# Patient Record
Sex: Female | Born: 1945 | Race: White | Hispanic: No | Marital: Married | Smoking: Never smoker
Health system: Southern US, Community
[De-identification: ages and names within clinical notes are randomized; demographics above are authoritative.]

## PROBLEM LIST (undated history)

## (undated) DIAGNOSIS — I509 Heart failure, unspecified: Secondary | ICD-10-CM

## (undated) DIAGNOSIS — J449 Chronic obstructive pulmonary disease, unspecified: Secondary | ICD-10-CM

## (undated) DIAGNOSIS — G8929 Other chronic pain: Secondary | ICD-10-CM

## (undated) DIAGNOSIS — Z9289 Personal history of other medical treatment: Secondary | ICD-10-CM

## (undated) DIAGNOSIS — I341 Nonrheumatic mitral (valve) prolapse: Secondary | ICD-10-CM

## (undated) DIAGNOSIS — J42 Unspecified chronic bronchitis: Secondary | ICD-10-CM

## (undated) DIAGNOSIS — F419 Anxiety disorder, unspecified: Secondary | ICD-10-CM

## (undated) DIAGNOSIS — I2510 Atherosclerotic heart disease of native coronary artery without angina pectoris: Secondary | ICD-10-CM

## (undated) DIAGNOSIS — M545 Low back pain, unspecified: Secondary | ICD-10-CM

## (undated) DIAGNOSIS — K219 Gastro-esophageal reflux disease without esophagitis: Secondary | ICD-10-CM

## (undated) DIAGNOSIS — J302 Other seasonal allergic rhinitis: Secondary | ICD-10-CM

## (undated) DIAGNOSIS — I10 Essential (primary) hypertension: Secondary | ICD-10-CM

## (undated) DIAGNOSIS — J45909 Unspecified asthma, uncomplicated: Secondary | ICD-10-CM

## (undated) DIAGNOSIS — R079 Chest pain, unspecified: Secondary | ICD-10-CM

## (undated) DIAGNOSIS — D649 Anemia, unspecified: Secondary | ICD-10-CM

## (undated) DIAGNOSIS — I214 Non-ST elevation (NSTEMI) myocardial infarction: Secondary | ICD-10-CM

## (undated) DIAGNOSIS — M1990 Unspecified osteoarthritis, unspecified site: Secondary | ICD-10-CM

## (undated) DIAGNOSIS — R600 Localized edema: Secondary | ICD-10-CM

## (undated) DIAGNOSIS — I209 Angina pectoris, unspecified: Secondary | ICD-10-CM

## (undated) DIAGNOSIS — M3500 Sicca syndrome, unspecified: Secondary | ICD-10-CM

## (undated) DIAGNOSIS — J189 Pneumonia, unspecified organism: Secondary | ICD-10-CM

## (undated) DIAGNOSIS — E7800 Pure hypercholesterolemia, unspecified: Secondary | ICD-10-CM

## (undated) DIAGNOSIS — R112 Nausea with vomiting, unspecified: Secondary | ICD-10-CM

## (undated) DIAGNOSIS — Z9889 Other specified postprocedural states: Secondary | ICD-10-CM

## (undated) DIAGNOSIS — R062 Wheezing: Secondary | ICD-10-CM

## (undated) HISTORY — PX: COLONOSCOPY W/ BIOPSIES AND POLYPECTOMY: SHX1376

## (undated) HISTORY — PX: ESOPHAGOGASTRODUODENOSCOPY: SHX1529

## (undated) HISTORY — DX: Unspecified osteoarthritis, unspecified site: M19.90

## (undated) HISTORY — PX: JOINT REPLACEMENT: SHX530

## (undated) HISTORY — PX: LAPAROSCOPIC SALPINGOOPHERECTOMY: SUR795

## (undated) HISTORY — PX: ABDOMINAL HYSTERECTOMY: SHX81

---

## 1998-08-06 ENCOUNTER — Emergency Department (HOSPITAL_COMMUNITY): Admission: EM | Admit: 1998-08-06 | Discharge: 1998-08-06 | Payer: Self-pay | Admitting: Emergency Medicine

## 1999-11-01 ENCOUNTER — Other Ambulatory Visit: Admission: RE | Admit: 1999-11-01 | Discharge: 1999-11-01 | Payer: Self-pay | Admitting: Obstetrics & Gynecology

## 2000-01-27 ENCOUNTER — Other Ambulatory Visit: Admission: RE | Admit: 2000-01-27 | Discharge: 2000-01-27 | Payer: Self-pay | Admitting: Obstetrics & Gynecology

## 2000-10-17 ENCOUNTER — Other Ambulatory Visit: Admission: RE | Admit: 2000-10-17 | Discharge: 2000-10-17 | Payer: Self-pay | Admitting: Obstetrics & Gynecology

## 2001-03-26 ENCOUNTER — Emergency Department (HOSPITAL_COMMUNITY): Admission: EM | Admit: 2001-03-26 | Discharge: 2001-03-26 | Payer: Self-pay | Admitting: Internal Medicine

## 2001-09-30 ENCOUNTER — Emergency Department (HOSPITAL_COMMUNITY): Admission: EM | Admit: 2001-09-30 | Discharge: 2001-09-30 | Payer: Self-pay | Admitting: Emergency Medicine

## 2001-11-08 ENCOUNTER — Other Ambulatory Visit: Admission: RE | Admit: 2001-11-08 | Discharge: 2001-11-08 | Payer: Self-pay | Admitting: Obstetrics & Gynecology

## 2002-02-26 ENCOUNTER — Encounter: Payer: Self-pay | Admitting: Orthopedic Surgery

## 2002-02-26 ENCOUNTER — Encounter: Admission: RE | Admit: 2002-02-26 | Discharge: 2002-02-26 | Payer: Self-pay | Admitting: Orthopedic Surgery

## 2002-04-09 ENCOUNTER — Other Ambulatory Visit: Admission: RE | Admit: 2002-04-09 | Discharge: 2002-04-09 | Payer: Self-pay | Admitting: Obstetrics & Gynecology

## 2002-11-29 ENCOUNTER — Other Ambulatory Visit: Admission: RE | Admit: 2002-11-29 | Discharge: 2002-11-29 | Payer: Self-pay | Admitting: Obstetrics & Gynecology

## 2004-01-13 ENCOUNTER — Other Ambulatory Visit: Admission: RE | Admit: 2004-01-13 | Discharge: 2004-01-13 | Payer: Self-pay | Admitting: Obstetrics & Gynecology

## 2004-04-27 ENCOUNTER — Encounter: Admission: RE | Admit: 2004-04-27 | Discharge: 2004-04-27 | Payer: Self-pay | Admitting: Family Medicine

## 2004-07-28 ENCOUNTER — Encounter: Admission: RE | Admit: 2004-07-28 | Discharge: 2004-07-28 | Payer: Self-pay | Admitting: Family Medicine

## 2005-01-14 ENCOUNTER — Other Ambulatory Visit: Admission: RE | Admit: 2005-01-14 | Discharge: 2005-01-14 | Payer: Self-pay | Admitting: Obstetrics & Gynecology

## 2005-08-24 ENCOUNTER — Encounter: Admission: RE | Admit: 2005-08-24 | Discharge: 2005-08-24 | Payer: Self-pay | Admitting: Family Medicine

## 2005-09-14 ENCOUNTER — Encounter: Admission: RE | Admit: 2005-09-14 | Discharge: 2005-09-14 | Payer: Self-pay | Admitting: Family Medicine

## 2005-10-12 ENCOUNTER — Encounter: Admission: RE | Admit: 2005-10-12 | Discharge: 2005-10-12 | Payer: Self-pay | Admitting: Orthopedic Surgery

## 2005-10-31 HISTORY — PX: TOTAL HIP ARTHROPLASTY: SHX124

## 2005-10-31 HISTORY — PX: JOINT REPLACEMENT: SHX530

## 2005-12-19 ENCOUNTER — Inpatient Hospital Stay (HOSPITAL_COMMUNITY): Admission: RE | Admit: 2005-12-19 | Discharge: 2005-12-21 | Payer: Self-pay | Admitting: Orthopedic Surgery

## 2009-11-02 ENCOUNTER — Encounter: Admission: RE | Admit: 2009-11-02 | Discharge: 2009-11-02 | Payer: Self-pay | Admitting: Allergy

## 2011-03-18 NOTE — H&P (Signed)
NAMEKHANIYA, TENAGLIA             ACCOUNT NO.:  [id number]   MEDICAL RECORD NO.:  [id number]          PATIENT TYPE:  INP   LOCATION:  NA                           FACILITY:  [facility]   PHYSICIAN:  Mila Homer. Sherlean Foot, M.D. DATE OF BIRTH:  1946-02-28   DATE OF ADMISSION:  12/19/2005  DATE OF DISCHARGE:                                HISTORY & PHYSICAL   CHIEF COMPLAINT:  Painful left hip.   HISTORY OF PRESENT ILLNESS:  A 65 year old white female with significant  left hip pain which has worsened.  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  She has failed with conservative  treatment including injections.  Her symptoms have worsened to the point  where she has pain with activities of daily living and with every step.  Therefore, she is now indicated for a total hip replacement.   PAST MEDICAL HISTORY:  In general her health is good.   SURGERIES:  1.  Partial hysterectomy in 1964.  2.  Oophorectomy in 1995.   MEDICATIONS:  1.  Premarin 1.25 mg daily.  2.  Prozac 10 mg daily.  3.  Diltiazem 240 mg daily.  4.  Fluticasone 50 mcg two squirts nasally daily.  5.  Nexium 40 mg q.12h.  6.  Vicodin ES 7.5/325 q.6h. p.r.n. pain.  7.  Flexeril 10 mg t.i.d. p.r.n.  8.  Geritol Complete one daily.  9.  Maxair one puff q.4-6h. p.r.n.  10. Advair 100/50, one puff b.i.d. p.r.n. q.12h.  11. Clindamycin 150 mg four tabs one hour prior to dental work.   ALLERGIES:  1.  PENICILLIN which developed a rash.  2.  CODEINE which caused her nausea.  3.  SULFA.   REVIEW OF SYSTEMS:  Positive for hypertension, asthma, and mitral valve  prolapse.   FAMILY HISTORY:  Positive for father with cardiomegaly.   SOCIAL HISTORY:  She is a very pleasant 65 year old white, married female,  [employer] operational officer.  She denies the use of tobacco or alcohol.   PHYSICAL EXAMINATION:  GENERAL:  A 65 year old white female well-developed,  well-nourished, mildly obese, alert, pleasant, cooperative,  moderate  distress secondary to left hip pain and groin pain.  VITAL SIGNS:  Reveal a temperature of 98.2, pulse 80, respirations 18, blood  pressure 150/82.  HEENT:  Head is normocephalic.  Eyes:  Pupils are equal, round, and reactive  to light and accommodation.  Extraocular movements intact.  Ear, nose, and  throat were benign.  NECK:  Supple.  No thyromegaly.  CHEST:  Had good expansion.  LUNGS:  Were essentially clear.  CARDIAC:  Regular rhythm and rate.  Normal S1 S2.  No discrete murmurs,  rubs, or gallops appreciated.  Pulses 2+ bilateral and symmetric.  No bruits  are noted.  ABDOMEN:  Obese, soft, nontender.  No masses palpable.  Normal bowel sounds  present.  GENITAL/RECTAL/BREASTS:  Not performed and not indicated for the procedure.  CNS:  Oriented x3.  Cranial nerves II-XII grossly intact.  MUSCULOSKELETAL:  She has minimal motion of the left hip with internal and  external rotation.  She can go  from 0 degrees to 100 degrees of flexion.  Positive log roll.  Sensation is intact distally.  Good capillary refill and  pulses are noted.   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   CLINICAL IMPRESSION:  Avascular necrosis, left hip with collapse.   RECOMMENDATIONS:  At this time, we feel she is a candidate for a left total  hip arthroplasty.  Procedure risks and benefits have been fully explained.  She is understanding.  We will proceed with this on December 19, 2005.      Oris Drone Petrarca, P.A.-C.    ______________________________  Mila Homer. Sherlean Foot, M.D.    BDP/MEDQ  D:  12/13/2005  T:  12/13/2005  Job:  [id number]

## 2011-03-18 NOTE — Discharge Summary (Signed)
NAMEMIRELY, PANGLE             ACCOUNT NO.:  [id number]   MEDICAL RECORD NO.:  [id number]          PATIENT TYPE:  INP   LOCATION:  5007                         FACILITY:  [facility]   PHYSICIAN:  Legrand Pitts. Duffy, P.A.   DATE OF BIRTH:  Nov 28, 1945   DATE OF ADMISSION:  12/19/2005  DATE OF DISCHARGE:  12/21/2005                                 DISCHARGE SUMMARY   ADMISSION DIAGNOSES:  1.  Avascular necrosis left hip.  2.  Gastroesophageal reflux disease.  3.  Asthma.  4.  Depression.  5.  Hypertension.  6.  Mitral bowel prolapse.   DISCHARGE DIAGNOSES:  1.  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  2.  Acute blood loss anemia secondary to surgery.  3  Hypertension.  4  Asthma.  5  Mitral valve prolapse.  6  Gastroesophageal reflux disease.   SURGICAL PROCEDURE:  On December 19, 2005 Ms. Katich underwent a left total  hip arthroplasty by Dr. Mila Homer. Lucey assisted by Richardean Canal, P.A-C.  She had a trilogy acetabular system shell without holes, 52-mm outer  diameter placed, with a Longevity cross length poly liner, a 32-mm inner  diameter, 52-mm outer diameter, a femoral head, 12-14 taper, non__________  32-mm diameter, plus 7-mm neck lengths with a femoral stem beaded, full coat  collared, standard neck, offset size 13.   COMPLICATIONS:  None.   CONSULTS:  1.  Physical therapy and case management consult December 20, 2005.  2.  Occupational therapy consult December 21, 2005.   HISTORY OF PRESENT ILLNESS:  A 65 year old white female patient presented to  Dr. Sherlean Foot with a significant history of left hip pain which she gotten  progressively worse. She now has pain with activities of daily living and  with every step. X-rays show __________ with femoral head collapse. Because  of that, she is presenting for a left hip replacement.   HOSPITAL COURSE:  Ms. Hafen, tolerated her surgical procedure well without  immediate postoperative complications.  She was transferred to 5000. Postop  day #1 pain was controlled with meds. T-max 100.6, vitals were stable. Left  leg was neurovascularly intact. Hemoglobin was 9.5, hematocrit 27.7. The  hemoglobin and hematocrit were monitored; and she was started on therapy per  protocol.   On postop day #2 she was afebrile, vitals stable. Pain was controlled with  meds. Hemoglobin had dropped to 8.7 with hematocrit of 25.5. She did well  with therapy; and was ready for discharge home and was discharged home later  that day.   DISCHARGE INSTRUCTIONS:  Diet:  She can resume her regular  prehospitalization diet.   MEDICATIONS:  She is to resume her preoperative meds. Those included:  1.  Multivitamin 1 tablet p.o. q.a.m..  2.  Advair discus 100/50 one puff inhaled p.r.n..  3.  Maxair 2 puffs inhaled p.r.n. q.4 h.  4.  Nexium 40 mg p.o. q.a.m.  5.  Premarin 1.25 mg p.o. q.a.m.  6.  Fluticasone 2 sprays inhaled q.a.m.  7.  Clindamycin 150 mg p.o. 4 tabs prior to dental procedures.  8.  Diltiazem ER 240 mg p.o. q.a.m.  9.  Fluoxetine 10 mg p.o. q.a.m.  10. Extra strength Vicodin 7.5/325 mg 1-2 p.o. q.6 h. p.r.n. for pain, not      to take while she is on other pain meds.  Additional meds at this time included:  1.  Lovenox 40 mg subcu q.a.m. last dose January 02, 2006.  2.  Norco 5-325 mg 1-2 tablets p.o. q.4 h. p.r.n. for pain 50 with no      refill.  3.  Robaxin 500 mg 1-2 tablets p.o. q.6 h. p.r.n. for spasms 40 with no      refill.  4.  She is encouraged to take iron supplement 2-3 times a day for the next 1      to 1-1/2 months.   ACTIVITY:  She can be out of bed, weightbearing as tolerated on the left leg  with the use of walker. She is to have home health PT per [facility]. Please see the blue total hip discharge sheet for further with  activity instructions.   WOUND CARE:  Please keep the left hip incision clean and dry. May shower  after no drainage from the wound for 2  days. Please see the blue total hip  discharge sheet for further wound care instructions.   FOLLOWUP:  She is to follow up with Dr. Sherlean Foot, in our office, on Tuesday  January 03, 2006; needs to call [phone] for that appointment.   LABORATORY DATA:  X-ray taken of the left hip after surgery on February 19  showed the left hip replacement in good position without complicating  features.  Hemoglobin and hematocrit ranged from 11.9 and 35.1 on February  13 with a platelet count of 420; to a low of 8.7 and 25.5 with platelet  count of 280 on February 21. White count at that time was 14.4.  On February  20 sodium was 132, glucose 119, BUN 5, creatinine 0.18, and calcium 8.1. On  February 21 sodium and potassium within normal limits; glucose was 141 and  calcium 8.1. All other laboratory studies were within normal limits.      Legrand Pitts Duffy, P.A.     KED/MEDQ  D:  02/17/2006  T:  02/18/2006  Job:  [id number]

## 2011-03-18 NOTE — Op Note (Signed)
NAMECHAYLEE, Donna Williamson             ACCOUNT NO.:  [id number]   MEDICAL RECORD NO.:  [id number]          PATIENT TYPE:  INP   LOCATION:  2550                         FACILITY:  [facility]   PHYSICIAN:  Mila Homer. Sherlean Foot, M.D. DATE OF BIRTH:  07/18/1946   DATE OF PROCEDURE:  12/19/2005  DATE OF DISCHARGE:                                 OPERATIVE REPORT   SURGEON:  Mila Homer. Sherlean Foot, M.D.   ASSISTANT:  Richardean Canal, P.A.   ANESTHESIA:  General.   PREOPERATIVE DIAGNOSIS:  Left hip avascular necrosis.   POSTOPERATIVE DIAGNOSIS:  Left hip avascular necrosis.   PROCEDURE:  Left total hip arthroplasty.   INDICATION FOR PROCEDURE:  The patient is a 65 year old with failure of  conservative measures and radiographic evidence of AVN of the femoral head.  Informed consent was obtained.   DESCRIPTION OF PROCEDURE:  The patient was laid supine and administered  general anesthesia.  A Foley catheter was placed.  She was then placed in  the right lateral decubitus position.  The left hip was then prepped and  draped in the usual sterile fashion.  I then used a 10 blade to make a  curvilinear incision centered over the trochanter.  I then went down to and  through the fascia lata and obtained hemostasis.  I placed a Charnley  retract in place.  I then performed an anterolateral approach to the hip,  taking up the anterior one-half of the vastus lateralis, one-third of the  gluteus medius and the gluteus minimus with that.  I tagged it with 3 stay  sutures.  I then performed an anterior hip capsulectomy.  I then used a  cutting guide to make out our neck cut and made the cut and removed the neck  and head segment easily.  I then placed a Homan retractor anteriorly and  posteriorly, removed the labrum and scar tissues circumferentially around  the hip.  I then switched sides with the table and I then reamed  sequentially up to 50 mm and placed a 52-mm needle-nosed spike and native  acetabular  alignment.  I then tamped in the 7-mm offset polyethylene and  switched back to the backside of the patient.  I then externally rotated the  leg into a sterile pouch off the anterior side of the bed.  I gained access  with the femoral canal finder.  I then reamed laterally in the trochanter  and then sequentially reamed up to 13 mm.  I then broached to 13 and used  the calcar planer.  I then trialed various head sizes and a +7 mm gives the  best stability, offset and leg length.  I then removed the trial components  and tamped in a fully porous-coated size 13 stem, placed a 7-mm ball onto a  clean Morse taper and located the hip.  I then irrigated and then closed the  medius, vastus lateralis and minimus sleeve through 3 drills holes in the  trochanter.  I then closed the fascia lata with running #1 Vicryl sutures,  deep soft tissues with interrupted 0 Vicryl sutures, subcuticular with 2-0  Vicryl sutures and skin staples.   COMPLICATIONS:  None.   DRAINS:  None.   ESTIMATED BLOOD LOSS:  300 mL.   DRESSINGS:  Adaptic, 4 x 4, ABDs and an Ioban drape.           ______________________________  Mila Homer. Sherlean Foot, M.D.     SDL/MEDQ  D:  12/19/2005  T:  12/20/2005  Job:  [id number]

## 2011-03-21 ENCOUNTER — Emergency Department (HOSPITAL_COMMUNITY)
Admission: EM | Admit: 2011-03-21 | Discharge: 2011-03-21 | Disposition: A | Discharge status: Home / Self Care | Payer: Medicare Other | Attending: Emergency Medicine | Admitting: Emergency Medicine

## 2011-03-21 DIAGNOSIS — R21 Rash and other nonspecific skin eruption: Secondary | ICD-10-CM | POA: Insufficient documentation

## 2011-03-21 DIAGNOSIS — J40 Bronchitis, not specified as acute or chronic: Secondary | ICD-10-CM | POA: Insufficient documentation

## 2011-03-21 DIAGNOSIS — I10 Essential (primary) hypertension: Secondary | ICD-10-CM | POA: Insufficient documentation

## 2011-09-30 ENCOUNTER — Other Ambulatory Visit: Payer: Self-pay | Admitting: Allergy

## 2011-09-30 ENCOUNTER — Ambulatory Visit
Admission: RE | Admit: 2011-09-30 | Discharge: 2011-09-30 | Disposition: A | Discharge status: Home / Self Care | Payer: Medicare Other | Source: Ambulatory Visit | Attending: Allergy | Admitting: Allergy

## 2012-03-03 ENCOUNTER — Ambulatory Visit (INDEPENDENT_AMBULATORY_CARE_PROVIDER_SITE_OTHER): Payer: BC Managed Care – PPO | Admitting: Emergency Medicine

## 2012-03-03 ENCOUNTER — Ambulatory Visit: Payer: BC Managed Care – PPO

## 2012-03-03 VITALS — BP 160/77 | HR 83 | Temp 97.6°F | Resp 16 | Ht 65.0 in | Wt 219.4 lb

## 2012-03-03 DIAGNOSIS — R05 Cough: Secondary | ICD-10-CM

## 2012-03-03 DIAGNOSIS — J45909 Unspecified asthma, uncomplicated: Secondary | ICD-10-CM

## 2012-03-03 DIAGNOSIS — Z9109 Other allergy status, other than to drugs and biological substances: Secondary | ICD-10-CM

## 2012-03-03 LAB — POCT CBC
Granulocyte percent: 75.8 %G (ref 37–80)
Lymph, poc: 2.3 (ref 0.6–3.4)
MCH, POC: 28.9 pg (ref 27–31.2)
MCV: 89.8 fL (ref 80–97)
MID (cbc): 0.4 (ref 0–0.9)
MPV: 7.4 fL (ref 0–99.8)
POC LYMPH PERCENT: 20.5 %L (ref 10–50)
POC MID %: 3.7 %M (ref 0–12)

## 2012-03-03 MED ORDER — ALBUTEROL SULFATE (2.5 MG/3ML) 0.083% IN NEBU
2.5000 mg | INHALATION_SOLUTION | Freq: Once | RESPIRATORY_TRACT | Status: AC
  Administered 2012-03-03: 2.5 mg via RESPIRATORY_TRACT

## 2012-03-03 MED ORDER — BENZONATATE 200 MG PO CAPS
200.0000 mg | ORAL_CAPSULE | Freq: Three times a day (TID) | ORAL | Status: AC | PRN
Start: 2012-03-03 — End: 2012-03-10

## 2012-03-03 MED ORDER — PREDNISONE 20 MG PO TABS: ORAL_TABLET | ORAL | Status: DC

## 2012-03-03 NOTE — Progress Notes (Signed)
  Subjective:    Patient ID: Donna Williamson, female    DOB: [birth year], 66 y.o.   MRN: [id number]  HPI patient has been sick since April 18. She was initially seen by Dr. [place] and treated for sinusitis bronchitis. She was initially treated with a cortisone shot and Z-Pak. Biaxin. She continues to have a bad cough. She has been unable to get any sleep because of the cough patient is in good health except for seasonal allergies and severe reactive airways disease. She is on chronic Advair use and has a rescue inhaler.    Review of Systems she denies chest pain shortness of breath difficulty breathing or GI symptoms     Objective:   Physical Exam  Constitutional: She appears well-developed and well-nourished.  HENT:  Right Ear: External ear normal.  Left Ear: External ear normal.  Eyes: Pupils are equal, round, and reactive to light.  Neck: No tracheal deviation present.  Cardiovascular: Normal rate, regular rhythm and normal heart sounds.  Exam reveals no friction rub.   No murmur heard. Pulmonary/Chest: Effort normal. She has wheezes.       She has a prolonged inspiratory and expiratory phase. There were no areas of dullness. No true rales heard.    Results for orders placed in visit on 03/03/12  POCT CBC      Component Value Range   WBC 11.4 (*) 4.6 - 10.2 (K/uL)   Lymph, poc 2.3  0.6 - 3.4    POC LYMPH PERCENT 20.5  10 - 50 (%L)   MID (cbc) 0.4  0 - 0.9    POC MID % 3.7  0 - 12 (%M)   POC Granulocyte 8.6 (*) 2 - 6.9    Granulocyte percent 75.8  37 - 80 (%G)   RBC 3.70 (*) 4.04 - 5.48 (M/uL)   Hemoglobin 10.7 (*) 12.2 - 16.2 (g/dL)   HCT, POC 40.9 (*) 81.1 - 47.9 (%)   MCV 89.8  80 - 97 (fL)   MCH, POC 28.9  27 - 31.2 (pg)   MCHC 32.2  31.8 - 35.4 (g/dL)   RDW, POC 91.4     Platelet Count, POC 433 (*) 142 - 424 (K/uL)   MPV 7.4  0 - 99.8 (fL)   [facility] reading (PRIMARY) by  Dr.Kinsey Karch no acute disease .      Assessment & Plan:    I suspect most of this is allergy  related. She has a history of reactive airways disease and I feel she just has not improved with her current medicines. We'll treat with a prednisone Tessalon Perles and continue delsym.

## 2012-03-09 ENCOUNTER — Ambulatory Visit
Admission: RE | Admit: 2012-03-09 | Discharge: 2012-03-09 | Disposition: A | Discharge status: Home / Self Care | Payer: Medicare Other | Source: Ambulatory Visit | Attending: Allergy | Admitting: Allergy

## 2012-03-09 ENCOUNTER — Other Ambulatory Visit: Payer: Self-pay | Admitting: Allergy

## 2012-03-09 DIAGNOSIS — J329 Chronic sinusitis, unspecified: Secondary | ICD-10-CM

## 2012-03-27 ENCOUNTER — Other Ambulatory Visit (HOSPITAL_COMMUNITY)
Admission: RE | Admit: 2012-03-27 | Discharge: 2012-03-27 | Disposition: A | Discharge status: Home / Self Care | Payer: Medicare Other | Source: Ambulatory Visit | Attending: Gastroenterology | Admitting: Gastroenterology

## 2012-03-27 ENCOUNTER — Other Ambulatory Visit: Payer: Self-pay | Admitting: Gastroenterology

## 2012-03-27 DIAGNOSIS — B379 Candidiasis, unspecified: Secondary | ICD-10-CM | POA: Insufficient documentation

## 2012-03-27 DIAGNOSIS — K2289 Other specified disease of esophagus: Secondary | ICD-10-CM | POA: Insufficient documentation

## 2012-03-27 DIAGNOSIS — K228 Other specified diseases of esophagus: Secondary | ICD-10-CM | POA: Insufficient documentation

## 2012-09-20 ENCOUNTER — Other Ambulatory Visit: Payer: Self-pay | Admitting: Podiatry

## 2012-09-20 DIAGNOSIS — R52 Pain, unspecified: Secondary | ICD-10-CM

## 2012-09-29 ENCOUNTER — Encounter (HOSPITAL_COMMUNITY): Payer: Self-pay | Admitting: Emergency Medicine

## 2012-09-29 ENCOUNTER — Emergency Department (HOSPITAL_COMMUNITY)
Admission: EM | Admit: 2012-09-29 | Discharge: 2012-09-30 | Disposition: A | Discharge status: Home / Self Care | Payer: Medicare Other | Attending: Emergency Medicine | Admitting: Emergency Medicine

## 2012-09-29 ENCOUNTER — Emergency Department (HOSPITAL_COMMUNITY): Payer: Medicare Other

## 2012-09-29 DIAGNOSIS — Z79899 Other long term (current) drug therapy: Secondary | ICD-10-CM | POA: Insufficient documentation

## 2012-09-29 DIAGNOSIS — I10 Essential (primary) hypertension: Secondary | ICD-10-CM | POA: Insufficient documentation

## 2012-09-29 DIAGNOSIS — F411 Generalized anxiety disorder: Secondary | ICD-10-CM | POA: Insufficient documentation

## 2012-09-29 DIAGNOSIS — R071 Chest pain on breathing: Secondary | ICD-10-CM | POA: Insufficient documentation

## 2012-09-29 DIAGNOSIS — R0789 Other chest pain: Secondary | ICD-10-CM

## 2012-09-29 DIAGNOSIS — J45909 Unspecified asthma, uncomplicated: Secondary | ICD-10-CM | POA: Insufficient documentation

## 2012-09-29 DIAGNOSIS — K219 Gastro-esophageal reflux disease without esophagitis: Secondary | ICD-10-CM | POA: Insufficient documentation

## 2012-09-29 HISTORY — DX: Gastro-esophageal reflux disease without esophagitis: K21.9

## 2012-09-29 HISTORY — DX: Essential (primary) hypertension: I10

## 2012-09-29 HISTORY — DX: Anxiety disorder, unspecified: F41.9

## 2012-09-29 HISTORY — DX: Unspecified asthma, uncomplicated: J45.909

## 2012-09-29 LAB — CBC WITH DIFFERENTIAL/PLATELET
Lymphs Abs: 3 10*3/uL (ref 0.7–4.0)
MCH: 28.5 pg (ref 26.0–34.0)
MCHC: 32 g/dL (ref 30.0–36.0)
Monocytes Absolute: 0.7 10*3/uL (ref 0.1–1.0)
Monocytes Relative: 9 % (ref 3–12)
Neutro Abs: 4.7 10*3/uL (ref 1.7–7.7)
Neutrophils Relative %: 55 % (ref 43–77)
Platelets: 369 10*3/uL (ref 150–400)
RBC: 3.69 MIL/uL — ABNORMAL LOW (ref 3.87–5.11)
RDW: 12.8 % (ref 11.5–15.5)
WBC: 8.6 10*3/uL (ref 4.0–10.5)

## 2012-09-29 LAB — URINALYSIS, ROUTINE W REFLEX MICROSCOPIC
Glucose, UA: NEGATIVE mg/dL
Hgb urine dipstick: NEGATIVE
Nitrite: NEGATIVE
Protein, ur: NEGATIVE mg/dL
Specific Gravity, Urine: 1.013 (ref 1.005–1.030)
Urobilinogen, UA: 0.2 mg/dL (ref 0.0–1.0)
pH: 5.5 (ref 5.0–8.0)

## 2012-09-29 LAB — LIPASE, BLOOD: Lipase: 36 U/L (ref 11–59)

## 2012-09-29 LAB — COMPREHENSIVE METABOLIC PANEL
GFR calc Af Amer: 46 mL/min — ABNORMAL LOW (ref >90)
GFR calc non Af Amer: 40 mL/min — ABNORMAL LOW (ref >90)
Total Bilirubin: 0.1 mg/dL — ABNORMAL LOW (ref 0.3–1.2)
Total Protein: 7 g/dL (ref 6.0–8.3)

## 2012-09-29 MED ORDER — KETOROLAC TROMETHAMINE 30 MG/ML IJ SOLN
15.0000 mg | Freq: Once | INTRAMUSCULAR | Status: AC
  Administered 2012-09-29: 15 mg via INTRAVENOUS
  Filled 2012-09-29: qty 1

## 2012-09-29 MED ORDER — SODIUM CHLORIDE 0.9 % IV SOLN
INTRAVENOUS | Status: DC
  Administered 2012-09-29: 23:00:00 via INTRAVENOUS

## 2012-09-29 MED ORDER — LORAZEPAM 2 MG/ML IJ SOLN
0.5000 mg | Freq: Once | INTRAMUSCULAR | Status: AC
  Administered 2012-09-29: 0.5 mg via INTRAVENOUS
  Filled 2012-09-29: qty 1

## 2012-09-29 MED ORDER — MORPHINE SULFATE 4 MG/ML IJ SOLN
4.0000 mg | Freq: Once | INTRAMUSCULAR | Status: DC
  Filled 2012-09-29: qty 1

## 2012-09-29 MED ORDER — IOHEXOL 350 MG/ML SOLN
80.0000 mL | Freq: Once | INTRAVENOUS | Status: AC | PRN
  Administered 2012-09-29: 80 mL via INTRAVENOUS

## 2012-09-29 MED ORDER — SODIUM CHLORIDE 0.9 % IV BOLUS (SEPSIS)
1000.0000 mL | Freq: Once | INTRAVENOUS | Status: AC
  Administered 2012-09-29: 1000 mL via INTRAVENOUS

## 2012-09-29 NOTE — ED Notes (Signed)
Patient transported to X-ray 

## 2012-09-29 NOTE — ED Notes (Signed)
Patient transported to CT 

## 2012-09-29 NOTE — ED Notes (Signed)
Pt. States that Wed. She started having RUQ pain that goes around into her back. Denies N/V. States she felt she had a fever last night. Only hurts when she takes deep breath.

## 2012-09-29 NOTE — ED Provider Notes (Signed)
History     CSN: [id number]  Arrival date & time 09/29/12  2116   First MD Initiated Contact with Patient 09/29/12 2136      Chief Complaint  Patient presents with  . Abdominal Pain    (Consider location/radiation/quality/duration/timing/severity/associated sxs/prior treatment) Patient is a 65 y.o. female presenting with abdominal pain. The history is provided by the patient.  Abdominal Pain The primary symptoms of the illness include abdominal pain.   patient here with right-sided chest pain that has been for 3 days. Pain is worse with taking deep breath. States that she possibly had a fever last night. Apparently when she takes a deep breath and make certain movements and localized over her right costal margin. Denies any abdominal pain, vomiting, diarrhea. No association with food. Called her Dr. was told to come in for evaluation. No recent leg pain or swelling.  Past Medical History  Diagnosis Date  . Hypertension   . GERD (gastroesophageal reflux disease)   . Anxiety   . Asthma     Past Surgical History  Procedure Date  . Abdominal hysterectomy   . Hip orif w/ capsulotomy Left    No family history on file.  History  Substance Use Topics  . Smoking status: Never Smoker   . Smokeless tobacco: Not on file  . Alcohol Use: No    OB History    Grav Para Term Preterm Abortions TAB SAB Ect Mult Living                  Review of Systems  Gastrointestinal: Positive for abdominal pain.  All other systems reviewed and are negative.    Allergies  Levaquin; Codeine; Ciprofloxacin; Penicillins; and Sulfa antibiotics  Home Medications   Current Outpatient Rx  Name  Route  Sig  Dispense  Refill  . ALPRAZOLAM 0.5 MG PO TABS   Oral   Take 0.5 mg by mouth daily as needed. For anxiety         . ASPIRIN 81 MG PO CHEW   Oral   Chew 81 mg by mouth daily.         Marland Kitchen DILTIAZEM HCL ER 240 MG PO CP24   Oral   Take 240 mg by mouth daily.         Marland Kitchen ESTRADIOL 2  MG PO TABS   Oral   Take 2 mg by mouth daily.         Marland Kitchen FLUOXETINE HCL 10 MG PO TABS   Oral   Take 10 mg by mouth daily.         Marland Kitchen FLUTICASONE PROPIONATE 50 MCG/ACT NA SUSP   Nasal   Place 2 sprays into the nose daily.         Marland Kitchen FLUTICASONE-SALMETEROL 100-50 MCG/DOSE IN AEPB   Inhalation   Inhale 1 puff into the lungs every 12 (twelve) hours.         Marland Kitchen LEVALBUTEROL TARTRATE 45 MCG/ACT IN AERO   Inhalation   Inhale 1-2 puffs into the lungs every 4 (four) hours as needed.         Marland Kitchen MONTELUKAST SODIUM 10 MG PO TABS   Oral   Take 10 mg by mouth at bedtime.         Marland Kitchen OLMESARTAN MEDOXOMIL 40 MG PO TABS   Oral   Take 40 mg by mouth daily.         Marland Kitchen OMEPRAZOLE 40 MG PO CPDR   Oral   Take 40  mg by mouth daily.         Marland Kitchen VITAMIN B-12 1000 MCG PO TABS   Oral   Take 1,000 mcg by mouth daily.         Marland Kitchen VITAMIN D (ERGOCALCIFEROL) 50000 UNITS PO CAPS   Oral   Take 50,000 Units by mouth.           BP 168/65  Pulse 69  Temp 98 F (36.7 C) (Oral)  Resp 18  SpO2 97%  Physical Exam  Nursing note and vitals reviewed. Constitutional: She is oriented to person, place, and time. She appears well-developed and well-nourished.  Non-toxic appearance. No distress.  HENT:  Head: Normocephalic and atraumatic.  Eyes: Conjunctivae normal, EOM and lids are normal. Pupils are equal, round, and reactive to light.  Neck: Normal range of motion. Neck supple. No tracheal deviation present. No mass present.  Cardiovascular: Normal rate, regular rhythm and normal heart sounds.  Exam reveals no gallop.   No murmur heard. Pulmonary/Chest: Effort normal and breath sounds normal. No stridor. No respiratory distress. She has no decreased breath sounds. She has no wheezes. She has no rhonchi. She has no rales.    Abdominal: Soft. Normal appearance and bowel sounds are normal. She exhibits no distension. There is no tenderness. There is no rebound and no CVA tenderness.    Musculoskeletal: Normal range of motion. She exhibits no edema and no tenderness.  Neurological: She is alert and oriented to person, place, and time. She has normal strength. No cranial nerve deficit or sensory deficit. GCS eye subscore is 4. GCS verbal subscore is 5. GCS motor subscore is 6.  Skin: Skin is warm and dry. No abrasion and no rash noted.  Psychiatric: She has a normal mood and affect. Her speech is normal and behavior is normal.    ED Course  Procedures (including critical care time)  Labs Reviewed  CBC WITH DIFFERENTIAL - Abnormal; Notable for the following:    RBC 3.69 (*)     Hemoglobin 10.5 (*)     HCT 32.8 (*)     All other components within normal limits  D-DIMER, QUANTITATIVE - Abnormal; Notable for the following:    D-Dimer, Quant 0.56 (*)     All other components within normal limits  COMPREHENSIVE METABOLIC PANEL  LIPASE, BLOOD  URINALYSIS, ROUTINE W REFLEX MICROSCOPIC   Dg Chest 2 View  09/29/2012  *RADIOLOGY REPORT*  Clinical Data: Right-sided rib and chest pain for 4 days.  History of asthma.  CHEST - 2 VIEW  Comparison: Chest radiograph performed 03/03/2012  Findings: The lungs are well-aerated and clear.  There is no evidence of focal opacification, pleural effusion or pneumothorax. Minimal retrocardiac opacity is thought to reflect normal vasculature.  The heart is normal in size; the mediastinal contour is within normal limits.  No acute osseous abnormalities are seen.  IMPRESSION: No acute cardiopulmonary process seen.   Original Report Authenticated By: Tonia Ghent, M.D.      No diagnosis found.    MDM  No evidence of pe. Pt with likely chest wall --stable for discharge        Toy Baker, MD 09/30/12 0008

## 2012-09-30 MED ORDER — OXYCODONE-ACETAMINOPHEN 5-325 MG PO TABS: 2.0000 | ORAL_TABLET | ORAL | Status: DC | PRN

## 2012-09-30 MED ORDER — IBUPROFEN 600 MG PO TABS: 600.0000 mg | ORAL_TABLET | Freq: Four times a day (QID) | ORAL | Status: DC | PRN

## 2012-09-30 MED ORDER — METHOCARBAMOL 500 MG PO TABS: 500.0000 mg | ORAL_TABLET | Freq: Two times a day (BID) | ORAL | Status: DC

## 2012-10-02 ENCOUNTER — Ambulatory Visit
Admission: RE | Admit: 2012-10-02 | Discharge: 2012-10-02 | Disposition: A | Discharge status: Home / Self Care | Payer: Medicare Other | Source: Ambulatory Visit | Attending: Podiatry | Admitting: Podiatry

## 2012-10-02 DIAGNOSIS — R52 Pain, unspecified: Secondary | ICD-10-CM

## 2012-11-11 ENCOUNTER — Ambulatory Visit (INDEPENDENT_AMBULATORY_CARE_PROVIDER_SITE_OTHER): Payer: Medicare Other | Admitting: Family Medicine

## 2012-11-11 VITALS — BP 131/72 | HR 94 | Temp 98.4°F | Resp 16 | Ht 65.25 in | Wt 220.2 lb

## 2012-11-11 DIAGNOSIS — H60399 Other infective otitis externa, unspecified ear: Secondary | ICD-10-CM

## 2012-11-11 DIAGNOSIS — R42 Dizziness and giddiness: Secondary | ICD-10-CM

## 2012-11-11 DIAGNOSIS — H6090 Unspecified otitis externa, unspecified ear: Secondary | ICD-10-CM

## 2012-11-11 DIAGNOSIS — H6590 Unspecified nonsuppurative otitis media, unspecified ear: Secondary | ICD-10-CM

## 2012-11-11 DIAGNOSIS — H9209 Otalgia, unspecified ear: Secondary | ICD-10-CM

## 2012-11-11 MED ORDER — NEOMYCIN-POLYMYXIN-HC 3.5-10000-1 OT SOLN: 3.0000 [drp] | Freq: Four times a day (QID) | OTIC | Status: DC

## 2012-11-11 NOTE — Patient Instructions (Addendum)
Start cortisporin otic drops - this does have a sulfa type medicine in it, but less likely to cause a reaction than oral medicines.  If any new rash, or side effects stop this and return to clinic or emergency room. Recheck in next 3 days, sooner if worse. Saline nasal spray as needed, drink plenty of fluids. If any worsening of dizziness  - recheck.  Return to the clinic or go to the nearest emergency room if any of your symptoms worsen or new symptoms occur.

## 2012-11-11 NOTE — Progress Notes (Signed)
  Subjective:    Patient ID: Donna Williamson, female    DOB: August 11, 1946, 67 y.o.   MRN: [id number]  HPI Donna Williamson is a 67 y.o. female  Primary care Dr. Waynard Edwards. Last OV few weeks ago.   L ear soreness.  Noted about 10-11 days ago.  Notes some wetness on pillow in the am.  Tried peroxide wash last week,  feels more sore now. and balance off at times with pressure in ear. No fever - sweats in am.  No other cold symptoms.   Review of Systems  Constitutional: Negative for fever and chills.  Respiratory: Negative for cough, chest tightness and shortness of breath.   Cardiovascular: Negative for chest pain and palpitations.      Objective:   Physical Exam  Vitals reviewed. Constitutional: She is oriented to person, place, and time. She appears well-developed and well-nourished. No distress.  HENT:  Head: Normocephalic and atraumatic.  Right Ear: Hearing, tympanic membrane, external ear and ear canal normal.  Left Ear: There is drainage and swelling. A middle ear effusion is present.  Nose: Nose normal.  Mouth/Throat: Oropharynx is clear and moist. No oropharyngeal exudate.       Minimal swelling of canal, with yellow white adherent exudate.  Unable to visualize entire TM, but no apparent rupture. Clear fluid behind TM.   Eyes: Conjunctivae normal and EOM are normal. Pupils are equal, round, and reactive to light.  Cardiovascular: Normal rate, regular rhythm, normal heart sounds and intact distal pulses.   No murmur heard. Pulmonary/Chest: Effort normal and breath sounds normal. No respiratory distress. She has no wheezes. She has no rhonchi.  Neurological: She is alert and oriented to person, place, and time. She has normal strength. No sensory deficit. Coordination normal.       No nystagmus, no pronator drift. Nonfocal exam.  Skin: Skin is warm and dry. No rash noted. She is not diaphoretic.  Psychiatric: She has a normal mood and affect. Her behavior is normal.        Assessment & Plan:  [name] is a 67 y.o. female 1. Otitis externa  neomycin-polymyxin-hydrocortisone (CORTISPORIN) otic solution  2. Serous otitis media    3. Otalgia    4. Dizziness     Suspected otitis externa, but unlikely yet possible serous otitis with rupture.  Complicated by quinolone allergy.  Will treat with cortisporin otic susp - cautioned on sulfa component and to rtc if any new rash or side effects. Recheck in 3 days - may need po meds if not improving or sooner if worsening symptoms.   Patient Instructions  Start cortisporin otic drops - this does have a sulfa type medicine in it, but less likely to cause a reaction than oral medicines.  If any new rash, or side effects stop this and return to clinic or emergency room. Recheck in next 3 days, sooner if worse. Saline nasal spray as needed, drink plenty of fluids. If any worsening of dizziness  - recheck.  Return to the clinic or go to the nearest emergency room if any of your symptoms worsen or new symptoms occur.

## 2012-11-14 ENCOUNTER — Ambulatory Visit (INDEPENDENT_AMBULATORY_CARE_PROVIDER_SITE_OTHER): Payer: Medicare Other | Admitting: Family Medicine

## 2012-11-14 VITALS — BP 130/70 | HR 77 | Temp 98.1°F | Resp 16 | Ht 65.5 in | Wt 221.0 lb

## 2012-11-14 DIAGNOSIS — H6092 Unspecified otitis externa, left ear: Secondary | ICD-10-CM

## 2012-11-14 DIAGNOSIS — H60399 Other infective otitis externa, unspecified ear: Secondary | ICD-10-CM

## 2012-11-14 NOTE — Progress Notes (Signed)
  Subjective:    Patient ID: Donna Williamson, female    DOB: 1946/09/07, 67 y.o.   MRN: [id number]  HPI Donna Williamson is a 67 y.o. female Seen 3 days ago - treated with cortisporin otic for otitis externa.  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   Feeling a little better. Hearing ok, but still a little full.  No fever.  No recent discharge noted. Applying drops about 3 times per day.  No headache. Tolerating drops without difficulty.    Review of Systems  Constitutional: Negative for fever and chills.  HENT: Negative for hearing loss, ear pain, neck stiffness and ear discharge.   Skin: Negative for rash.       Objective:   Physical Exam  Vitals reviewed. Constitutional: She is oriented to person, place, and time. She appears well-developed and well-nourished. No distress.  HENT:  Head: Atraumatic. Macrocephalic.  Right Ear: Hearing, tympanic membrane, external ear and ear canal normal.  Left Ear: Hearing and external ear normal.  Ears:  Nose: Nose normal.  Mouth/Throat: Oropharynx is clear and moist. No oropharyngeal exudate.  Eyes: Conjunctivae normal and EOM are normal. Pupils are equal, round, and reactive to light.  Cardiovascular: Normal rate, regular rhythm, normal heart sounds and intact distal pulses.   No murmur heard. Pulmonary/Chest: Effort normal and breath sounds normal. No respiratory distress. She has no wheezes. She has no rhonchi.  Neurological: She is alert and oriented to person, place, and time.  Skin: Skin is warm and dry. No rash noted.  Psychiatric: She has a normal mood and affect. Her behavior is normal.       Assessment & Plan:  Donna Williamson is a 67 y.o. female 1. Otitis externa, left   improving - continue gtts - try for 4x/day, but improving with current dosing.  rtc precautions.   Patient Instructions  Continue antibiotics, and if any worsening before they are finished, or recurrence of symptoms afterwards - return for  recheck. Return to the clinic or go to the nearest emergency room if any of your symptoms worsen or new symptoms occur.

## 2012-11-14 NOTE — Patient Instructions (Signed)
Continue antibiotics, and if any worsening before they are finished, or recurrence of symptoms afterwards - return for recheck. Return to the clinic or go to the nearest emergency room if any of your symptoms worsen or new symptoms occur.

## 2013-03-08 ENCOUNTER — Other Ambulatory Visit: Payer: Self-pay

## 2013-05-16 ENCOUNTER — Other Ambulatory Visit: Payer: Self-pay | Admitting: Orthopedic Surgery

## 2013-05-16 DIAGNOSIS — M545 Low back pain, unspecified: Secondary | ICD-10-CM | POA: Insufficient documentation

## 2013-05-22 ENCOUNTER — Ambulatory Visit
Admission: RE | Admit: 2013-05-22 | Discharge: 2013-05-22 | Disposition: A | Discharge status: Home / Self Care | Payer: 59 | Source: Ambulatory Visit | Attending: Orthopedic Surgery | Admitting: Orthopedic Surgery

## 2013-05-22 DIAGNOSIS — M545 Low back pain: Secondary | ICD-10-CM

## 2013-07-08 ENCOUNTER — Other Ambulatory Visit: Payer: Self-pay | Admitting: Internal Medicine

## 2013-07-08 DIAGNOSIS — R109 Unspecified abdominal pain: Secondary | ICD-10-CM

## 2013-07-12 ENCOUNTER — Ambulatory Visit
Admission: RE | Admit: 2013-07-12 | Discharge: 2013-07-12 | Disposition: A | Discharge status: Home / Self Care | Payer: Medicare Other | Source: Ambulatory Visit | Attending: Internal Medicine | Admitting: Internal Medicine

## 2013-07-12 DIAGNOSIS — R109 Unspecified abdominal pain: Secondary | ICD-10-CM

## 2013-11-29 ENCOUNTER — Observation Stay (HOSPITAL_COMMUNITY)
Admission: EM | Admit: 2013-11-29 | Discharge: 2013-11-30 | Disposition: A | Discharge status: Home / Self Care | Payer: Medicare Other | Attending: Internal Medicine | Admitting: Internal Medicine

## 2013-11-29 ENCOUNTER — Emergency Department (HOSPITAL_COMMUNITY): Payer: Medicare Other

## 2013-11-29 ENCOUNTER — Encounter (HOSPITAL_COMMUNITY): Payer: Self-pay | Admitting: Emergency Medicine

## 2013-11-29 DIAGNOSIS — R079 Chest pain, unspecified: Secondary | ICD-10-CM | POA: Diagnosis present

## 2013-11-29 DIAGNOSIS — E785 Hyperlipidemia, unspecified: Secondary | ICD-10-CM | POA: Diagnosis present

## 2013-11-29 DIAGNOSIS — Z881 Allergy status to other antibiotic agents status: Secondary | ICD-10-CM | POA: Insufficient documentation

## 2013-11-29 DIAGNOSIS — Z882 Allergy status to sulfonamides status: Secondary | ICD-10-CM | POA: Insufficient documentation

## 2013-11-29 DIAGNOSIS — I10 Essential (primary) hypertension: Secondary | ICD-10-CM | POA: Diagnosis present

## 2013-11-29 DIAGNOSIS — Z880 Allergy status to penicillin: Secondary | ICD-10-CM | POA: Insufficient documentation

## 2013-11-29 DIAGNOSIS — Z7982 Long term (current) use of aspirin: Secondary | ICD-10-CM | POA: Insufficient documentation

## 2013-11-29 DIAGNOSIS — K219 Gastro-esophageal reflux disease without esophagitis: Secondary | ICD-10-CM | POA: Diagnosis present

## 2013-11-29 DIAGNOSIS — Z79899 Other long term (current) drug therapy: Secondary | ICD-10-CM | POA: Insufficient documentation

## 2013-11-29 DIAGNOSIS — IMO0002 Reserved for concepts with insufficient information to code with codable children: Secondary | ICD-10-CM | POA: Insufficient documentation

## 2013-11-29 DIAGNOSIS — F411 Generalized anxiety disorder: Secondary | ICD-10-CM | POA: Insufficient documentation

## 2013-11-29 DIAGNOSIS — J45909 Unspecified asthma, uncomplicated: Secondary | ICD-10-CM | POA: Insufficient documentation

## 2013-11-29 DIAGNOSIS — L301 Dyshidrosis [pompholyx]: Secondary | ICD-10-CM | POA: Insufficient documentation

## 2013-11-29 DIAGNOSIS — Z886 Allergy status to analgesic agent status: Secondary | ICD-10-CM | POA: Insufficient documentation

## 2013-11-29 DIAGNOSIS — R0789 Other chest pain: Principal | ICD-10-CM | POA: Insufficient documentation

## 2013-11-29 LAB — URINALYSIS, ROUTINE W REFLEX MICROSCOPIC
BILIRUBIN URINE: NEGATIVE
Glucose, UA: NEGATIVE mg/dL
HGB URINE DIPSTICK: NEGATIVE
Ketones, ur: NEGATIVE mg/dL
Leukocytes, UA: NEGATIVE
Nitrite: NEGATIVE
PROTEIN: NEGATIVE mg/dL
SPECIFIC GRAVITY, URINE: 1.01 (ref 1.005–1.030)
UROBILINOGEN UA: 0.2 mg/dL (ref 0.0–1.0)
pH: 5.5 (ref 5.0–8.0)

## 2013-11-29 LAB — BASIC METABOLIC PANEL
BUN: 28 mg/dL — AB (ref 6–23)
CO2: 20 mEq/L (ref 19–32)
CREATININE: 1.12 mg/dL — AB (ref 0.50–1.10)
Calcium: 9 mg/dL (ref 8.4–10.5)
Chloride: 97 mEq/L (ref 96–112)
GFR, EST AFRICAN AMERICAN: 57 mL/min — AB (ref >90)
GFR, EST NON AFRICAN AMERICAN: 49 mL/min — AB (ref >90)
Glucose, Bld: 83 mg/dL (ref 70–99)
Potassium: 4.3 mEq/L (ref 3.7–5.3)
Sodium: 134 mEq/L — ABNORMAL LOW (ref 137–147)

## 2013-11-29 LAB — CBC WITH DIFFERENTIAL/PLATELET
BASOS PCT: 0 % (ref 0–1)
Basophils Absolute: 0 10*3/uL (ref 0.0–0.1)
EOS ABS: 0.1 10*3/uL (ref 0.0–0.7)
Eosinophils Relative: 0 % (ref 0–5)
HEMATOCRIT: 33.6 % — AB (ref 36.0–46.0)
HEMOGLOBIN: 11.3 g/dL — AB (ref 12.0–15.0)
Lymphocytes Relative: 28 % (ref 12–46)
Lymphs Abs: 3.4 10*3/uL (ref 0.7–4.0)
MCH: 31.5 pg (ref 26.0–34.0)
MCHC: 33.6 g/dL (ref 30.0–36.0)
MCV: 93.6 fL (ref 78.0–100.0)
MONO ABS: 0.7 10*3/uL (ref 0.1–1.0)
MONOS PCT: 6 % (ref 3–12)
Neutro Abs: 7.8 10*3/uL — ABNORMAL HIGH (ref 1.7–7.7)
Neutrophils Relative %: 65 % (ref 43–77)
Platelets: 353 10*3/uL (ref 150–400)
RBC: 3.59 MIL/uL — ABNORMAL LOW (ref 3.87–5.11)
RDW: 13.1 % (ref 11.5–15.5)
WBC: 12 10*3/uL — ABNORMAL HIGH (ref 4.0–10.5)

## 2013-11-29 LAB — TROPONIN I: Troponin I: <0.3 ng/mL (ref <0.30)

## 2013-11-29 MED ORDER — DILTIAZEM HCL ER COATED BEADS 240 MG PO CP24
240.0000 mg | ORAL_CAPSULE | Freq: Every day | ORAL | Status: DC
  Administered 2013-11-30: 240 mg via ORAL
  Filled 2013-11-29: qty 1

## 2013-11-29 MED ORDER — FLUTICASONE PROPIONATE 50 MCG/ACT NA SUSP
2.0000 | Freq: Every day | NASAL | Status: DC
  Administered 2013-11-30: 2 via NASAL
  Filled 2013-11-29: qty 16

## 2013-11-29 MED ORDER — ACETAMINOPHEN 650 MG RE SUPP: 650.0000 mg | Freq: Four times a day (QID) | RECTAL | Status: DC | PRN

## 2013-11-29 MED ORDER — ALBUTEROL SULFATE (2.5 MG/3ML) 0.083% IN NEBU: 2.5000 mg | INHALATION_SOLUTION | RESPIRATORY_TRACT | Status: DC | PRN

## 2013-11-29 MED ORDER — DILTIAZEM HCL ER COATED BEADS 240 MG PO CP24: 57600.0000 mg | ORAL_CAPSULE | Freq: Every day | ORAL | Status: DC

## 2013-11-29 MED ORDER — ALPRAZOLAM 0.5 MG PO TABS: 0.5000 mg | ORAL_TABLET | Freq: Two times a day (BID) | ORAL | Status: DC | PRN

## 2013-11-29 MED ORDER — IRBESARTAN 75 MG PO TABS
75.0000 mg | ORAL_TABLET | Freq: Every day | ORAL | Status: DC
  Administered 2013-11-30: 75 mg via ORAL
  Filled 2013-11-29: qty 1

## 2013-11-29 MED ORDER — NITROGLYCERIN 0.4 MG SL SUBL: 0.4000 mg | SUBLINGUAL_TABLET | SUBLINGUAL | Status: DC | PRN

## 2013-11-29 MED ORDER — FLUOXETINE HCL 20 MG PO TABS: 10.0000 mg | ORAL_TABLET | Freq: Every day | ORAL | Status: DC

## 2013-11-29 MED ORDER — ONDANSETRON HCL 4 MG PO TABS: 4.0000 mg | ORAL_TABLET | Freq: Four times a day (QID) | ORAL | Status: DC | PRN

## 2013-11-29 MED ORDER — ASPIRIN 81 MG PO CHEW
324.0000 mg | CHEWABLE_TABLET | Freq: Once | ORAL | Status: AC
  Administered 2013-11-29: 324 mg via ORAL
  Filled 2013-11-29: qty 4

## 2013-11-29 MED ORDER — ONDANSETRON HCL 4 MG/2ML IJ SOLN: 4.0000 mg | Freq: Four times a day (QID) | INTRAMUSCULAR | Status: DC | PRN

## 2013-11-29 MED ORDER — ENOXAPARIN SODIUM 40 MG/0.4ML ~~LOC~~ SOLN
40.0000 mg | SUBCUTANEOUS | Status: DC
  Administered 2013-11-29: 40 mg via SUBCUTANEOUS
  Filled 2013-11-29 (×2): qty 0.4

## 2013-11-29 MED ORDER — ASPIRIN 81 MG PO CHEW
324.0000 mg | CHEWABLE_TABLET | Freq: Every day | ORAL | Status: DC
Start: 2013-11-30 — End: 2013-11-30
  Administered 2013-11-30: 324 mg via ORAL
  Filled 2013-11-29: qty 4

## 2013-11-29 MED ORDER — ACETAMINOPHEN 325 MG PO TABS: 650.0000 mg | ORAL_TABLET | Freq: Four times a day (QID) | ORAL | Status: DC | PRN

## 2013-11-29 MED ORDER — VITAMIN D (ERGOCALCIFEROL) 1.25 MG (50000 UNIT) PO CAPS: 50000.0000 [IU] | ORAL_CAPSULE | ORAL | Status: DC

## 2013-11-29 MED ORDER — ESTRADIOL 2 MG PO TABS
2.0000 mg | ORAL_TABLET | Freq: Every day | ORAL | Status: DC
  Administered 2013-11-30: 2 mg via ORAL
  Filled 2013-11-29: qty 1

## 2013-11-29 MED ORDER — FLUTICASONE PROPIONATE 50 MCG/ACT NA SUSP
2.0000 | Freq: Every day | NASAL | Status: DC
  Filled 2013-11-29: qty 16

## 2013-11-29 MED ORDER — PANTOPRAZOLE SODIUM 40 MG PO TBEC
40.0000 mg | DELAYED_RELEASE_TABLET | Freq: Every day | ORAL | Status: DC
  Administered 2013-11-30: 40 mg via ORAL
  Filled 2013-11-29: qty 1

## 2013-11-29 MED ORDER — MOMETASONE FURO-FORMOTEROL FUM 100-5 MCG/ACT IN AERO
2.0000 | INHALATION_SPRAY | Freq: Two times a day (BID) | RESPIRATORY_TRACT | Status: DC
  Administered 2013-11-29: 2 via RESPIRATORY_TRACT
  Filled 2013-11-29: qty 8.8

## 2013-11-29 MED ORDER — MONTELUKAST SODIUM 10 MG PO TABS
10.0000 mg | ORAL_TABLET | Freq: Every day | ORAL | Status: DC
  Administered 2013-11-29: 10 mg via ORAL
  Filled 2013-11-29 (×2): qty 1

## 2013-11-29 MED ORDER — SODIUM CHLORIDE 0.9 % IV SOLN
INTRAVENOUS | Status: DC
  Administered 2013-11-29: via INTRAVENOUS

## 2013-11-29 MED ORDER — SODIUM CHLORIDE 0.9 % IJ SOLN
3.0000 mL | Freq: Two times a day (BID) | INTRAMUSCULAR | Status: DC
  Administered 2013-11-29 – 2013-11-30 (×2): 3 mL via INTRAVENOUS

## 2013-11-29 MED ORDER — LEVALBUTEROL TARTRATE 45 MCG/ACT IN AERO: 1.0000 | INHALATION_SPRAY | RESPIRATORY_TRACT | Status: DC | PRN

## 2013-11-29 MED ORDER — SODIUM CHLORIDE 0.9 % IV SOLN
Freq: Once | INTRAVENOUS | Status: AC
  Administered 2013-11-29: 14:00:00 via INTRAVENOUS

## 2013-11-29 MED ORDER — FLUOXETINE HCL 10 MG PO CAPS
10.0000 mg | ORAL_CAPSULE | Freq: Every day | ORAL | Status: DC
  Administered 2013-11-30: 10 mg via ORAL
  Filled 2013-11-29: qty 1

## 2013-11-29 MED ORDER — VITAMIN B-12 1000 MCG PO TABS
1000.0000 ug | ORAL_TABLET | Freq: Every day | ORAL | Status: DC
Start: 2013-11-30 — End: 2013-11-30
  Administered 2013-11-30: 1000 ug via ORAL
  Filled 2013-11-29: qty 1

## 2013-11-29 NOTE — H&P (Addendum)
[facility] History and Physical  Donna Williamson [id number] DOB: 05-Apr-1946 DOA: 11/29/2013  Referring physician:  PCP: Jerlyn Ly, MD  Specialists:   Chief Complaint: chest pain   HPI: Donna Williamson is a 68 y.o. female with PMH of HTN, HPL, GERD,asthma presented with L sided non exertional chest pain lasting 30 minutes associated with diaphoresis, resolved on SL NTG; initially she had some L sided headaches which resulted in hyperventilation, anxiety; then she developed chest pains; she denies exertional cardiopulmonary symptoms at baseline;   Review of Systems: The patient denies anorexia, fever, weight loss,, vision loss, decreased hearing, hoarseness,  syncope, dyspnea on exertion, peripheral edema, balance deficits, hemoptysis, abdominal pain, melena, hematochezia, severe indigestion/heartburn, hematuria, incontinence, genital sores, muscle weakness, suspicious skin lesions, transient blindness, difficulty walking, depession, unusual weight change, abnormal bleeding, enlarged lymph nodes, angioedema, and breast masses.    Past Medical History  Diagnosis Date  . Hypertension   . GERD (gastroesophageal reflux disease)   . Anxiety   . Asthma    Past Surgical History  Procedure Laterality Date  . Abdominal hysterectomy    . Hip orif w/ capsulotomy  Left   Social History:  reports that she has never smoked. She does not have any smokeless tobacco history on file. She reports that she does not drink alcohol. Her drug history is not on file. Home;  where does patient live--home, ALF, SNF? and with whom if at home? Yes;  Can patient participate in ADLs?  Allergies  Allergen Reactions  . Levaquin [Levofloxacin In D5w] Anaphylaxis  . Codeine Nausea Only  . Ciprofloxacin Rash  . Penicillins Rash  . Sulfa Antibiotics Rash    No family history on file. no h/o CVA (be sure to complete)  Prior to Admission medications   Medication Sig Start Date End Date Taking?  Authorizing Provider  ALPRAZolam Duanne Moron) 0.5 MG tablet Take 0.5 mg by mouth daily as needed. For anxiety   Yes Historical Provider, MD  aspirin 81 MG chewable tablet Chew 81 mg by mouth daily.   Yes Historical Provider, MD  Cholecalciferol (VITAMIN D3) 2000 UNITS TABS Take 1 tablet by mouth daily.   Yes Historical Provider, MD  diltiazem (CARDIZEM CD) 240 MG 24 hr capsule Take 240 capsules by mouth daily. 11/27/13  Yes Historical Provider, MD  EPIPEN 2-PAK 0.3 MG/0.3ML SOAJ injection Inject 0.3 mg into the skin as needed. 11/01/13  Yes Historical Provider, MD  estradiol (ESTRACE) 2 MG tablet Take 2 mg by mouth daily.   Yes Historical Provider, MD  FLUoxetine (PROZAC) 10 MG tablet Take 10 mg by mouth daily.   Yes Historical Provider, MD  fluticasone (FLONASE) 50 MCG/ACT nasal spray Place 2 sprays into the nose daily.   Yes Historical Provider, MD  Fluticasone-Salmeterol (ADVAIR) 100-50 MCG/DOSE AEPB Inhale 1 puff into the lungs every 12 (twelve) hours.   Yes Historical Provider, MD  ibuprofen (ADVIL,MOTRIN) 600 MG tablet Take 1 tablet (600 mg total) by mouth every 6 (six) hours as needed for pain. 09/30/12  Yes Leota Jacobsen, MD  IRON PO Take 1 tablet by mouth daily.   Yes Historical Provider, MD  levalbuterol [facility] HFA) 45 MCG/ACT inhaler Inhale 1-2 puffs into the lungs every 4 (four) hours as needed.   Yes Historical Provider, MD  mometasone (ELOCON) 0.1 % cream Apply 1 application topically as needed. 11/07/13  Yes Historical Provider, MD  montelukast (SINGULAIR) 10 MG tablet Take 10 mg by mouth at bedtime.   Yes  Historical Provider, MD  olmesartan (BENICAR) 40 MG tablet Take 40 mg by mouth daily.   Yes Historical Provider, MD  omeprazole (PRILOSEC) 40 MG capsule Take 40 mg by mouth daily.   Yes Historical Provider, MD  PROAIR HFA 108 (90 BASE) MCG/ACT inhaler Inhale 1-2 puffs into the lungs every 4 (four) hours as needed. 11/01/13  Yes Historical Provider, MD  vitamin B-12 (CYANOCOBALAMIN) 1000 MCG  tablet Take 1,000 mcg by mouth daily.   Yes Historical Provider, MD  Vitamin D, Ergocalciferol, (DRISDOL) 50000 UNITS CAPS Take 50,000 Units by mouth.   Yes Historical Provider, MD   Physical Exam: Filed Vitals:   11/29/13 1645  BP: 138/63  Pulse: 67  Temp:   Resp: 18     General:  alert  Eyes: eom-i  ENT: no oral ulcers   Neck: supple   Cardiovascular: s1,s2 rrr  Respiratory: CTA BL  Abdomen: soft, nt, nd  Skin: no rash  Musculoskeletal: no le edema  Psychiatric: no hallucinations   Neurologic: CN 2-12 intact   Labs on Admission:  Basic Metabolic Panel:  Recent Labs Lab 11/29/13 1346  NA 134*  K 4.3  CL 97  CO2 20  GLUCOSE 83  BUN 28*  CREATININE 1.12*  CALCIUM 9.0   Liver Function Tests: No results found for this basename: AST, ALT, ALKPHOS, BILITOT, PROT, ALBUMIN,  in the last 168 hours No results found for this basename: LIPASE, AMYLASE,  in the last 168 hours No results found for this basename: AMMONIA,  in the last 168 hours CBC:  Recent Labs Lab 11/29/13 1346  WBC 12.0*  NEUTROABS 7.8*  HGB 11.3*  HCT 33.6*  MCV 93.6  PLT 353   Cardiac Enzymes:  Recent Labs Lab 11/29/13 1346  TROPONINI <0.30    BNP (last 3 results) No results found for this basename: PROBNP,  in the last 8760 hours CBG: No results found for this basename: GLUCAP,  in the last 168 hours  Radiological Exams on Admission: Dg Chest 2 View  11/29/2013   CLINICAL DATA:  Chest pain.  Left neck pain.  EXAM: CHEST  2 VIEW  COMPARISON:  DG CHEST 2 VIEW dated 09/29/2012  FINDINGS: The heart size and mediastinal contours are within normal limits. Both lungs are clear. The visualized skeletal structures are unremarkable.  IMPRESSION: No active cardiopulmonary disease.   Electronically Signed   By: Rolm Baptise M.D.   On: 11/29/2013 15:11    EKG: Independently reviewed. 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 female with PMH of HTN, HPL, GERD, asthma presented with L sided non exertional chest pain lasting 30 minutes associated with diaphoresis resolved on NTG  1. Chest pain atypical presentation; ECG some q waves, initial trop neg; chest pain resolved;  -cont tele monitor, serial ECG, trop, cont ASA, NTG, c/s cardiology for stress stress test eval;   2. HTN, stable; cont home regimen  3. Asthma, stable; CXR: no acute findings; cont home regimen   4. GERD cont PPI  Cardiology;  if consultant consulted, please document name and whether formally or informally consulted  Code Status: full (must indicate code status--if unknown or must be presumed, indicate so) Family Communication: d/wpatient (indicate person spoken with, if applicable, with phone number if by telephone) Disposition Plan: home 24-48 hours (indicate anticipated LOS)  Time spent: >35  minutes   Kinnie Feil [facility] Pager [phone]  If 7PM-7AM, please contact night-coverage [email or web address] Password [facility] 11/29/2013, 5:31 PM

## 2013-11-29 NOTE — Consult Note (Signed)
CARDIOLOGY CONSULT NOTE   Patient ID: Donna Williamson MRN: [id number], DOB/AGE: February 25, 1946   Admit date: 11/29/2013 Date of Consult: 11/29/2013   Primary Physician: Jerlyn Ly, MD Primary Cardiologist: none  Pt. Profile Chest pain  Problem List  Past Medical History  Diagnosis Date  . Hypertension   . GERD (gastroesophageal reflux disease)   . Anxiety   . Asthma     Past Surgical History  Procedure Laterality Date  . Abdominal hysterectomy    . Hip orif w/ capsulotomy  Left     Allergies  Allergies  Allergen Reactions  . Levaquin [Levofloxacin In D5w] Anaphylaxis  . Codeine Nausea Only  . Ciprofloxacin Rash  . Penicillins Rash  . Sulfa Antibiotics Rash    HPI   A very pleasant 68 year old female with h/o GERD, hypertension hyperlipidemia and no h/o CAD who is coming complaining of headache and chest pain today. She works as a Network engineer at [employer] where she was today when experienced sudden onset headache that resolved but came back in few minutes later and was more intense and associated with chest tightness and diaphoresis. Her husband took her to the ER when she received Aspirin and nitroglycerin after which her pain resolved.  She is quite active a walks at work without any limitations. This was her first episode of chest pain ever. She has asthma and states that she is always SOB, but it has been stable for years. No palpitations, orthopnea, LE edema or syncope.  She has known hyperlipidemia that is untreated as she was intolerant tomultiple statins (muscle pain and cramping). Her father died of heart failure at age of 97.   Family History No family history on file.   Social History History   Social History  . Marital Status: Married    Spouse Name: N/A    Number of Children: N/A  . Years of Education: N/A   Occupational History  . Not on file.   Social History Main Topics  . Smoking status: Never Smoker   . Smokeless tobacco: Not on  file  . Alcohol Use: No  . Drug Use: Not on file  . Sexual Activity: Not on file   Other Topics Concern  . Not on file   Social History Narrative  . No narrative on file     Review of Systems  General:  No chills, fever, night sweats or weight changes.  Cardiovascular:  No chest pain, dyspnea on exertion, edema, orthopnea, palpitations, paroxysmal nocturnal dyspnea. Dermatological: No rash, lesions/masses Respiratory: No cough, dyspnea Urologic: No hematuria, dysuria Abdominal:   No nausea, vomiting, diarrhea, bright red blood per rectum, melena, or hematemesis Neurologic:  No visual changes, wkns, changes in mental status. All other systems reviewed and are otherwise negative except as noted above.  Physical Exam  Blood pressure 146/61, pulse 71, temperature 98.2 F (36.8 C), temperature source Oral, resp. rate 19, SpO2 97.00%.  General: Pleasant, NAD Psych: Normal affect. Neuro: Alert and oriented X 3. Moves all extremities spontaneously. HEENT: Normal  Neck: Supple without bruits or JVD. Lungs:  Resp regular and unlabored, CTA. Heart: RRR no s3, s4, or murmurs. Abdomen: Soft, non-tender, non-distended, BS + x 4.  Extremities: No clubbing, cyanosis or edema. DP/PT/Radials 2+ and equal bilaterally.  Labs  Recent Labs  11/29/13 1346  TROPONINI <0.30   Lab Results  Component Value Date   WBC 12.0* 11/29/2013   HGB 11.3* 11/29/2013   HCT 33.6* 11/29/2013  MCV 93.6 11/29/2013   PLT 353 11/29/2013    Recent Labs Lab 11/29/13 1346  NA 134*  K 4.3  CL 97  CO2 20  BUN 28*  CREATININE 1.12*  CALCIUM 9.0  GLUCOSE 83   Radiology/Studies  Dg Chest 2 View  11/29/2013   CLINICAL DATA:  Chest pain.  Left neck pain.  EXAM: CHEST  2 VIEW  COMPARISON:  DG CHEST 2 VIEW dated 09/29/2012  FINDINGS: The heart size and mediastinal contours are within normal limits. Both lungs are clear. The visualized skeletal structures are unremarkable.  IMPRESSION: No active  cardiopulmonary disease.      Echocardiogram - none  ECG: SR, poor R wave progression in the anterior leads   ASSESSMENT AND PLAN  68 year old female   1. Chest pain - atypical, we will continue monitoring ECGs and troponins, the first troponin is negative and there is possible prior anterior MI on ECG. No acute changes. Considering her risk factors that include obesity, HTN and untreated hyperlipidemia we will schedule an exercise nuclear stress test for tomorrow. If normal discharge home.  2. Hypertension - borderline currently, might be white coat sy, we will monitor overnight  3. Hyperlipidemia - we will check and possibly refer to lipid clinic with Alferd Apa    Signed, Dorothy Spark, MD, [facility] 11/29/2013, 6:58 PM

## 2013-11-29 NOTE — ED Notes (Signed)
EKG completed and given to EDP.  

## 2013-11-29 NOTE — ED Provider Notes (Signed)
CSN: [id number]     Arrival date & time 11/29/13  1301 History   First MD Initiated Contact with Patient 11/29/13 1318     Chief Complaint  Patient presents with  . Chest Pain   (Consider location/radiation/quality/duration/timing/severity/associated sxs/prior Treatment) HPI Comments: Pt come in with cc of headache and chest pain. Hx of HTN. She reports that around noon, she had sharp pain in her head and around her eye. The headache was radiating down her neck. No associated nausea, vomiting, visual complains, seizures, altered mental status, loss of consciousness, new weakness, or numbness, no gait instability. Pt there after started having left sided chest pain, radiating to the scapular region and her let shoulder. She comes to the ED with the pain improved. No associated dib, nausea, dizziness, + diaphoresis. No premature CAD in family. No recent provocative cardiac testing.   Patient is a 68 y.o. female presenting with chest pain. The history is provided by the patient.  Chest Pain Associated symptoms: diaphoresis   Associated symptoms: no abdominal pain, no cough, no nausea, no shortness of breath and not vomiting     Past Medical History  Diagnosis Date  . Hypertension   . GERD (gastroesophageal reflux disease)   . Anxiety   . Asthma    Past Surgical History  Procedure Laterality Date  . Abdominal hysterectomy    . Hip orif w/ capsulotomy  Left   No family history on file. History  Substance Use Topics  . Smoking status: Never Smoker   . Smokeless tobacco: Not on file  . Alcohol Use: No   OB History   Grav Para Term Preterm Abortions TAB SAB Ect Mult Living                 Review of Systems  Constitutional: Positive for diaphoresis. Negative for activity change.  HENT: Negative for facial swelling.   Respiratory: Negative for cough, shortness of breath and wheezing.   Cardiovascular: Positive for chest pain.  Gastrointestinal: Negative for nausea, vomiting,  abdominal pain, diarrhea, constipation, blood in stool and abdominal distention.  Genitourinary: Negative for hematuria and difficulty urinating.  Musculoskeletal: Negative for neck pain.  Skin: Negative for color change.  Neurological: Negative for speech difficulty.  Hematological: Does not bruise/bleed easily.  Psychiatric/Behavioral: Negative for confusion.    Allergies  Levaquin; Codeine; Ciprofloxacin; Penicillins; and Sulfa antibiotics  Home Medications   Current Outpatient Rx  Name  Route  Sig  Dispense  Refill  . ALPRAZolam (XANAX) 0.5 MG tablet   Oral   Take 0.5 mg by mouth daily as needed. For anxiety         . aspirin 81 MG chewable tablet   Oral   Chew 81 mg by mouth daily.         . Cholecalciferol (VITAMIN D3) 2000 UNITS TABS   Oral   Take 1 tablet by mouth daily.         Marland Kitchen diltiazem (CARDIZEM CD) 240 MG 24 hr capsule   Oral   Take 240 capsules by mouth daily.         Marland Kitchen EPIPEN 2-PAK 0.3 MG/0.3ML SOAJ injection   Subcutaneous   Inject 0.3 mg into the skin as needed.         Marland Kitchen estradiol (ESTRACE) 2 MG tablet   Oral   Take 2 mg by mouth daily.         Marland Kitchen FLUoxetine (PROZAC) 10 MG tablet   Oral   Take 10 mg  by mouth daily.         . fluticasone (FLONASE) 50 MCG/ACT nasal spray   Nasal   Place 2 sprays into the nose daily.         . Fluticasone-Salmeterol (ADVAIR) 100-50 MCG/DOSE AEPB   Inhalation   Inhale 1 puff into the lungs every 12 (twelve) hours.         Marland Kitchen ibuprofen (ADVIL,MOTRIN) 600 MG tablet   Oral   Take 1 tablet (600 mg total) by mouth every 6 (six) hours as needed for pain.   30 tablet   0   . IRON PO   Oral   Take 1 tablet by mouth daily.         Marland Kitchen levalbuterol (XOPENEX HFA) 45 MCG/ACT inhaler   Inhalation   Inhale 1-2 puffs into the lungs every 4 (four) hours as needed.         . mometasone (ELOCON) 0.1 % cream   Topical   Apply 1 application topically as needed.         . montelukast (SINGULAIR) 10  MG tablet   Oral   Take 10 mg by mouth at bedtime.         Marland Kitchen olmesartan (BENICAR) 40 MG tablet   Oral   Take 40 mg by mouth daily.         Marland Kitchen omeprazole (PRILOSEC) 40 MG capsule   Oral   Take 40 mg by mouth daily.         Marland Kitchen PROAIR HFA 108 (90 BASE) MCG/ACT inhaler   Inhalation   Inhale 1-2 puffs into the lungs every 4 (four) hours as needed.         . vitamin B-12 (CYANOCOBALAMIN) 1000 MCG tablet   Oral   Take 1,000 mcg by mouth daily.         . Vitamin D, Ergocalciferol, (DRISDOL) 50000 UNITS CAPS   Oral   Take 50,000 Units by mouth.          BP 138/63  Pulse 67  Temp(Src) 98.2 F (36.8 C) (Oral)  Resp 18  SpO2 97% Physical Exam  Nursing note and vitals reviewed. Constitutional: She is oriented to person, place, and time. She appears well-developed and well-nourished.  HENT:  Head: Normocephalic and atraumatic.  Eyes: EOM are normal. Pupils are equal, round, and reactive to light.  Neck: Neck supple. No JVD present.  No carotid bruit  Cardiovascular: Normal rate, regular rhythm and normal heart sounds.   No murmur heard. Pulmonary/Chest: Effort normal. No respiratory distress.  Abdominal: Soft. She exhibits no distension. There is no tenderness. There is no rebound and no guarding.  Neurological: She is alert and oriented to person, place, and time. No cranial nerve deficit. Coordination normal.  Skin: Skin is warm and dry.    ED Course  Procedures (including critical care time) Labs Review Labs Reviewed  CBC WITH DIFFERENTIAL - Abnormal; Notable for the following:    WBC 12.0 (*)    RBC 3.59 (*)    Hemoglobin 11.3 (*)    HCT 33.6 (*)    Neutro Abs 7.8 (*)    All other components within normal limits  BASIC METABOLIC PANEL - Abnormal; Notable for the following:    Sodium 134 (*)    BUN 28 (*)    Creatinine, Ser 1.12 (*)    GFR calc non Af Amer 49 (*)    GFR calc Af Amer 57 (*)    All other components within  normal limits  TROPONIN I   URINALYSIS, ROUTINE W REFLEX MICROSCOPIC   Imaging Review Dg Chest 2 View  11/29/2013   CLINICAL DATA:  Chest pain.  Left neck pain.  EXAM: CHEST  2 VIEW  COMPARISON:  DG CHEST 2 VIEW dated 09/29/2012  FINDINGS: The heart size and mediastinal contours are within normal limits. Both lungs are clear. The visualized skeletal structures are unremarkable.  IMPRESSION: No active cardiopulmonary disease.   Electronically Signed   By: Rolm Baptise M.D.   On: 11/29/2013 15:11    EKG Interpretation    Date/Time:  Friday November 29 2013 16:45:47 EST Ventricular Rate:  69 PR Interval:  190 QRS Duration: 97 QT Interval:  405 QTC Calculation: 434 R Axis:   20 Text Interpretation:  Sinus rhythm Abnormal inferior Q waves Borderline T abnormalities, anterior leads No acute findings or new changes Confirmed by Kathrynn Humble, MD, Farris Blash ([id number]) on 11/29/2013 4:56:43 PM            MDM  No diagnosis found.  Differential diagnosis includes: ACS syndrome CHF exacerbation Valvular disorder Myocarditis Pericarditis Pericardial effusion Pneumonia Pleural effusion Pulmonary edema PE Anemia Musculoskeletal pain Dissection Carotid/Vertebral dissection or stenosis  PT comes in with chest pain. Her HEART score, if trops are neg x2 is 4  Hx Moderately suspicious 1  ECG Normal 0   Age ? 63 years 2   Risk Factors  1 or 2 risk factors 1   Troponin  ? normal limit 0   I spoke with PCP and they are comfortable with outpatient workup - but her HEART score is 4, so we will admit her for formal cards eval and possible stress. Left sided chest pain with diaphoresis - now chest pain free.     Varney Biles, MD 11/29/13 1711

## 2013-11-29 NOTE — ED Notes (Signed)
Was at work had a sharp pain in her head and then she had  Then had tightness in left shoulder and arm

## 2013-11-29 NOTE — ED Notes (Signed)
Pt reports sudden onset of sharp pain above L eye x 2 episodes.  Pt reports pain resolved on it's own, lasted a few seconds.  Pt reports she sat down to rest with onset of stabbing pain in her neck that radiated into chest and to L scapula.  Pt reports diaphoresis and nausea during episode.  Pt reports pain is mild at present.

## 2013-11-30 ENCOUNTER — Encounter (HOSPITAL_COMMUNITY): Payer: Self-pay | Admitting: Radiology

## 2013-11-30 ENCOUNTER — Other Ambulatory Visit (HOSPITAL_COMMUNITY): Payer: Medicare Other

## 2013-11-30 ENCOUNTER — Observation Stay (HOSPITAL_COMMUNITY): Payer: Medicare Other

## 2013-11-30 DIAGNOSIS — R079 Chest pain, unspecified: Secondary | ICD-10-CM

## 2013-11-30 LAB — TROPONIN I: Troponin I: <0.3 ng/mL (ref <0.30)

## 2013-11-30 MED ORDER — TECHNETIUM TC 99M SESTAMIBI - CARDIOLITE
10.0000 | Freq: Once | INTRAVENOUS | Status: AC | PRN
  Administered 2013-11-30: 08:00:00 10 via INTRAVENOUS

## 2013-11-30 MED ORDER — TECHNETIUM TC 99M SESTAMIBI - CARDIOLITE
30.0000 | Freq: Once | INTRAVENOUS | Status: AC | PRN
  Administered 2013-11-30: 30 via INTRAVENOUS

## 2013-11-30 NOTE — Progress Notes (Signed)
   Patient Name: Donna Williamson Date of Encounter: 11/30/2013     Principal Problem:   Chest pain Active Problems:   HTN (hypertension)   Hyperlipidemia   GERD (gastroesophageal reflux disease)    SUBJECTIVE  No chest pain overnight. Presented for exercise nuclear stress test today.  CURRENT MEDS . aspirin  324 mg Oral Daily  . diltiazem  240 mg Oral Daily  . enoxaparin (LOVENOX) injection  40 mg Subcutaneous Q24H  . estradiol  2 mg Oral Daily  . FLUoxetine  10 mg Oral Daily  . fluticasone  2 spray Each Nare Daily  . irbesartan  75 mg Oral Daily  . mometasone-formoterol  2 puff Inhalation BID  . montelukast  10 mg Oral QHS  . pantoprazole  40 mg Oral Daily  . sodium chloride  3 mL Intravenous Q12H  . vitamin B-12  1,000 mcg Oral Daily  . [START ON 12/06/2013] Vitamin D (Ergocalciferol)  50,000 Units Oral Q7 days    OBJECTIVE  Filed Vitals:   11/29/13 1745 11/29/13 1857 11/29/13 2046 11/30/13 0624  BP: 146/61 145/53 150/58 146/67  Pulse: 71 67 66 71  Temp:  97.9 F (36.6 C) 98.4 F (36.9 C) 99.3 F (37.4 C)  TempSrc:  Oral Oral Oral  Resp: 19 18 18 18   Height:  5\' 5"  (1.651 m)    Weight:  214 lb (97.07 kg)    SpO2: 97% 96% 96% 96%    Intake/Output Summary (Last 24 hours) at 11/30/13 0943 Last data filed at 11/30/13 0659  Gross per 24 hour  Intake 848.75 ml  Output      0 ml  Net 848.75 ml   Filed Weights   11/29/13 1857  Weight: 214 lb (97.07 kg)    PHYSICAL EXAM  General: Pleasant, NAD. Neuro: Alert and oriented X 3. Moves all extremities spontaneously. Psych: Normal affect. HEENT:  Normal  Neck: Supple without bruits or JVD. Lungs:  Resp regular and unlabored, CTA. Heart: RRR no s3, s4, or murmurs. Abdomen: Soft, non-tender, non-distended, BS + x 4.  Extremities: No clubbing, cyanosis or edema. DP/PT/Radials 2+ and equal bilaterally.  Accessory Clinical Findings  CBC  Recent Labs  11/29/13 1346  WBC 12.0*  NEUTROABS 7.8*  HGB 11.3*    HCT 33.6*  MCV 93.6  PLT 329   Basic Metabolic Panel  Recent Labs  11/29/13 1346  NA 134*  K 4.3  CL 97  CO2 20  GLUCOSE 83  BUN 28*  CREATININE 1.12*  CALCIUM 9.0   Cardiac Enzymes  Recent Labs  11/29/13 1346 11/29/13 2045 11/30/13 0309  TROPONINI <0.30 <0.30 <0.30    ECG  NSR  Radiology/Studies  Dg Chest 2 View  11/29/2013   CLINICAL DATA:  Chest pain.  Left neck pain.  EXAM: CHEST  2 VIEW  COMPARISON:  DG CHEST 2 VIEW dated 09/29/2012  FINDINGS: The heart size and mediastinal contours are within normal limits. Both lungs are clear. The visualized skeletal structures are unremarkable.  IMPRESSION: No active cardiopulmonary disease.      ASSESSMENT AND PLAN  Chest pain - No further chest pain, enzymes negative, Myoview today.   Tyrell Antonio PA-C  Pager [phone]  Agree with above.  Patient feels well.  If Myoview is normal she can home later today.  Exam unremarkable.

## 2013-11-30 NOTE — Progress Notes (Signed)
Utilization Review Completed.   Octavio Matheney, RN, BSN Nurse Case Manager  

## 2013-11-30 NOTE — Discharge Summary (Signed)
Physician Discharge Summary  Donna Williamson HEN:[id number] DOB: 11-Sep-1946 DOA: 11/29/2013  PCP: Jerlyn Ly, MD  Admit date: 11/29/2013 Discharge date: 11/30/2013  Time spent: 45 minutes  Recommendations for Outpatient Follow-up:  -Will be discharged home today. -Advised to follow up with her PCP in 2 weeks.   Discharge Diagnoses:  Principal Problem:   Chest pain Active Problems:   HTN (hypertension)   Hyperlipidemia   GERD (gastroesophageal reflux disease)   Discharge Condition: Stable and improved  Filed Weights   11/29/13 1857  Weight: 97.07 kg (214 lb)    History of present illness:  Donna Williamson is a 68 y.o. female with PMH of HTN, HPL, GERD,asthma presented with L sided non exertional chest pain lasting 30 minutes associated with diaphoresis, resolved on SL NTG; initially she had some L sided headaches which resulted in hyperventilation, anxiety; then she developed chest pains; she denies exertional cardiopulmonary symptoms at baseline. Hospitalist admission was requested.     Hospital Course:   Chest Pain -Ruled out for ACS. -EKG with signs of possible old anterior MI, but without acute ischemic changes. -Had a stress myoview: IMPRESSION:  1. Negative for exercise-stress induced ischemia.  2. Left ventricular ejection fraction 65%. -no further cardiac work up is recommended at this point.  HTN -Well controlled. -Continue current meds.   Procedures:  Stress myoview   Consultations:  Cardiology  Discharge Instructions  Discharge Orders   Future Orders Complete By Expires   Diet - low sodium heart healthy  As directed    Discontinue IV  As directed    Increase activity slowly  As directed        Medication List         ALPRAZolam 0.5 MG tablet  Commonly known as:  XANAX  Take 0.5 mg by mouth daily as needed. For anxiety     aspirin 81 MG chewable tablet  Chew 81 mg by mouth daily.     diltiazem 240 MG 24 hr capsule  Commonly  known as:  CARDIZEM CD  Take 240 capsules by mouth daily.     EPIPEN 2-PAK 0.3 mg/0.3 mL Soaj injection  Generic drug:  EPINEPHrine  Inject 0.3 mg into the skin as needed.     estradiol 2 MG tablet  Commonly known as:  ESTRACE  Take 2 mg by mouth daily.     FLUoxetine 10 MG tablet  Commonly known as:  PROZAC  Take 10 mg by mouth daily.     fluticasone 50 MCG/ACT nasal spray  Commonly known as:  FLONASE  Place 2 sprays into the nose daily.     Fluticasone-Salmeterol 100-50 MCG/DOSE Aepb  Commonly known as:  ADVAIR  Inhale 1 puff into the lungs every 12 (twelve) hours.     ibuprofen 600 MG tablet  Commonly known as:  ADVIL,MOTRIN  Take 1 tablet (600 mg total) by mouth every 6 (six) hours as needed for pain.     IRON PO  Take 1 tablet by mouth daily.     levalbuterol 45 MCG/ACT inhaler  Commonly known as:  XOPENEX HFA  Inhale 1-2 puffs into the lungs every 4 (four) hours as needed.     mometasone 0.1 % cream  Commonly known as:  ELOCON  Apply 1 application topically as needed.     montelukast 10 MG tablet  Commonly known as:  SINGULAIR  Take 10 mg by mouth at bedtime.     olmesartan 40 MG tablet  Commonly known as:  BENICAR  Take 40 mg by mouth daily.     omeprazole 40 MG capsule  Commonly known as:  PRILOSEC  Take 40 mg by mouth daily.     PROAIR HFA 108 (90 BASE) MCG/ACT inhaler  Generic drug:  albuterol  Inhale 1-2 puffs into the lungs every 4 (four) hours as needed.     vitamin B-12 1000 MCG tablet  Commonly known as:  CYANOCOBALAMIN  Take 1,000 mcg by mouth daily.     Vitamin D (Ergocalciferol) 50000 UNITS Caps capsule  Commonly known as:  DRISDOL  Take 50,000 Units by mouth.     Vitamin D3 2000 UNITS Tabs  Take 1 tablet by mouth daily.       Allergies  Allergen Reactions  . Levaquin [Levofloxacin In D5w] Anaphylaxis  . Codeine Nausea Only  . Ciprofloxacin Rash  . Penicillins Rash  . Sulfa Antibiotics Rash       Follow-up Information    Follow up with PERINI,MARK A, MD. Schedule an appointment as soon as possible for a visit in 2 weeks.   Specialty:  Internal Medicine   Contact information:   [place] [address] [phone]        The results of significant diagnostics from this hospitalization (including imaging, microbiology, ancillary and laboratory) are listed below for reference.    Significant Diagnostic Studies: Dg Chest 2 View  11/29/2013   CLINICAL DATA:  Chest pain.  Left neck pain.  EXAM: CHEST  2 VIEW  COMPARISON:  DG CHEST 2 VIEW dated 09/29/2012  FINDINGS: The heart size and mediastinal contours are within normal limits. Both lungs are clear. The visualized skeletal structures are unremarkable.  IMPRESSION: No active cardiopulmonary disease.   Electronically Signed   By: Rolm Baptise M.D.   On: 11/29/2013 15:11   Nm Myocar Multi W/spect W/wall Motion / Ef  11/30/2013   CLINICAL DATA:  Chest pain, hypertension  TECHNIQUE: NUCLEAR MEDICINE MYOCARDIAL PERFUSION IMAGING  NUCLEAR MEDICINE LEFT VENTRICULAR WALL MOTION ANALYSIS  NUCLEAR MEDICINE LEFT VENTRICULAR EJECTION FRACTION CALCULATION  Standard single day myocardial SPECT imaging was performed after resting intravenous injection of Tc-76m sestamibi. After performance of protocol treadmill stress under supervision of cardiology staff, sestamibi was injected intravenously and standard myocardial SPECT imaging was performed. Quantitative gated imaging was also performed to evaluate left ventricular wall motion and estimate left ventricular ejection fraction.  COMPARISON:  None  RADIOPHARMACEUTICALS:  10+30 mCi Tc33m sestamibiIV.  FINDINGS: The patient achieved target heart rate. The stress SPECT images demonstrate mild apical thinning, otherwise physiologic distribution of radiopharmaceutical. Rest images demonstrate apical thinning as before, no other perfusion defects. The gated stress SPECT images demonstrate normal left ventricular myocardial  thickening including apex. No focal wall motion abnormality is seen. Calculated left ventricular end-diastolic volume 0000000, end-systolic volume 123456, ejection fraction of 65%.  IMPRESSION: 1. Negative for exercise-stress induced ischemia. 2. Left ventricular ejection fraction 65%.   Electronically Signed   By: Arne Cleveland M.D.   On: 11/30/2013 12:53    Microbiology: No results found for this or any previous visit (from the past 240 hour(s)).   Labs: Basic Metabolic Panel:  Recent Labs Lab 11/29/13 1346  NA 134*  K 4.3  CL 97  CO2 20  GLUCOSE 83  BUN 28*  CREATININE 1.12*  CALCIUM 9.0   Liver Function Tests: No results found for this basename: AST, ALT, ALKPHOS, BILITOT, PROT, ALBUMIN,  in the last 168 hours No results found for this basename:  LIPASE, AMYLASE,  in the last 168 hours No results found for this basename: AMMONIA,  in the last 168 hours CBC:  Recent Labs Lab 11/29/13 1346  WBC 12.0*  NEUTROABS 7.8*  HGB 11.3*  HCT 33.6*  MCV 93.6  PLT 353   Cardiac Enzymes:  Recent Labs Lab 11/29/13 1346 11/29/13 2045 11/30/13 0309 11/30/13 1253  TROPONINI <0.30 <0.30 <0.30 <0.30   BNP: BNP (last 3 results) No results found for this basename: PROBNP,  in the last 8760 hours CBG: No results found for this basename: GLUCAP,  in the last 168 hours     Signed:  Lelon Frohlich  [facility] Pager: [phone] 11/30/2013, 5:11 PM

## 2014-01-13 ENCOUNTER — Other Ambulatory Visit (HOSPITAL_COMMUNITY): Payer: Self-pay | Admitting: Internal Medicine

## 2014-01-13 ENCOUNTER — Ambulatory Visit (HOSPITAL_COMMUNITY)
Admission: RE | Admit: 2014-01-13 | Discharge: 2014-01-13 | Disposition: A | Discharge status: Home / Self Care | Payer: Medicare Other | Source: Ambulatory Visit | Attending: Surgery | Admitting: Surgery

## 2014-01-13 DIAGNOSIS — R55 Syncope and collapse: Secondary | ICD-10-CM | POA: Insufficient documentation

## 2014-02-26 ENCOUNTER — Other Ambulatory Visit: Payer: Self-pay | Admitting: Orthopedic Surgery

## 2014-02-26 DIAGNOSIS — M25561 Pain in right knee: Secondary | ICD-10-CM

## 2014-03-03 ENCOUNTER — Ambulatory Visit
Admission: RE | Admit: 2014-03-03 | Discharge: 2014-03-03 | Disposition: A | Discharge status: Home / Self Care | Payer: Medicare Other | Source: Ambulatory Visit | Attending: Orthopedic Surgery | Admitting: Orthopedic Surgery

## 2014-03-03 DIAGNOSIS — M25561 Pain in right knee: Secondary | ICD-10-CM

## 2014-03-06 DIAGNOSIS — M25561 Pain in right knee: Secondary | ICD-10-CM | POA: Insufficient documentation

## 2014-12-17 DIAGNOSIS — H2513 Age-related nuclear cataract, bilateral: Secondary | ICD-10-CM | POA: Insufficient documentation

## 2014-12-17 DIAGNOSIS — H04123 Dry eye syndrome of bilateral lacrimal glands: Secondary | ICD-10-CM | POA: Insufficient documentation

## 2014-12-17 DIAGNOSIS — M3500 Sicca syndrome, unspecified: Secondary | ICD-10-CM | POA: Insufficient documentation

## 2015-02-16 ENCOUNTER — Ambulatory Visit (INDEPENDENT_AMBULATORY_CARE_PROVIDER_SITE_OTHER): Payer: Medicare Other | Admitting: Family Medicine

## 2015-02-16 VITALS — BP 148/84 | HR 83 | Temp 98.2°F | Resp 16 | Ht 65.5 in | Wt 237.1 lb

## 2015-02-16 DIAGNOSIS — R109 Unspecified abdominal pain: Secondary | ICD-10-CM

## 2015-02-16 DIAGNOSIS — R300 Dysuria: Secondary | ICD-10-CM

## 2015-02-16 DIAGNOSIS — R351 Nocturia: Secondary | ICD-10-CM | POA: Diagnosis not present

## 2015-02-16 DIAGNOSIS — R1030 Lower abdominal pain, unspecified: Secondary | ICD-10-CM

## 2015-02-16 LAB — POCT URINALYSIS DIPSTICK
Bilirubin, UA: NEGATIVE
Glucose, UA: NEGATIVE
Ketones, UA: NEGATIVE
Nitrite, UA: NEGATIVE
Protein, UA: NEGATIVE
Spec Grav, UA: 1.015
Urobilinogen, UA: 0.2
pH, UA: 5.5

## 2015-02-16 LAB — POCT UA - MICROSCOPIC ONLY
Casts, Ur, LPF, POC: NEGATIVE
Crystals, Ur, HPF, POC: NEGATIVE
Mucus, UA: NEGATIVE
Yeast, UA: NEGATIVE

## 2015-02-16 MED ORDER — NITROFURANTOIN MONOHYD MACRO 100 MG PO CAPS: 100.0000 mg | ORAL_CAPSULE | Freq: Two times a day (BID) | ORAL | Status: DC

## 2015-02-16 NOTE — Patient Instructions (Signed)

## 2015-02-16 NOTE — Progress Notes (Signed)
Subjective:  This chart was scribed for Donna Haber MD, by Tamsen Roers, at [facility].  This patient was seen in room 1 and the patient's care was started at 6:37 PM.    Patient ID: Donna Williamson, female    DOB: [birth year], 69 y.o.   MRN: [id number]  HPI  HPI Comments: [name] is a 69 y.o. female who presents to [facility] for abdominal pain and urgency onset a 11 days ago.  She has associated symptoms of flank pain, night sweats, dysuria and fatigue. She notes she is getting up more frequently to use the restroom in the middle of the night.  She has not yet taken any medication to alleviate her symptoms.Patient usually takes doxycycline or Z packs due to her medication allergies.   Patient is a part time Agricultural engineer at [employer]. She has no other complaints today.      Review of Systems  Constitutional: Positive for fever and diaphoresis.  HENT: Negative for congestion, hearing loss, mouth sores, nosebleeds and postnasal drip.   Respiratory: Negative for cough, choking, chest tightness and shortness of breath.   Cardiovascular: Negative for chest pain.  Gastrointestinal: Positive for abdominal pain.  Genitourinary: Positive for dysuria, urgency and frequency.  Musculoskeletal: Positive for back pain.       Objective:   Physical Exam BP 148/84 mmHg   Pulse 83   Temp(Src) 98.2 F (36.8 C) (Oral)   Resp 16   Ht 5' 5.5" (1.664 m)   Wt 237 lb 2 oz (107.559 kg)   BMI 38.85 kg/m2   SpO2 94%  No acute distress in this adult woman appearing stated age HEENT: Unremarkable Gait: Stable Flank: Nontender Abdomen: Soft without hurting rebound  Results for orders placed or performed in visit on 02/16/15  POCT UA - Microscopic Only  Result Value Ref Range   WBC, Ur, HPF, POC 0-1    RBC, urine, microscopic 0-1    Bacteria, U Microscopic trace    Mucus, UA neg    Epithelial cells, urine per micros 0-1    Crystals, Ur, HPF, POC neg    Casts, Ur, LPF, POC neg    Yeast, UA neg   POCT urinalysis dipstick  Result Value Ref Range   Color, UA yellow    Clarity, UA clear    Glucose, UA neg    Bilirubin, UA neg    Ketones, UA neg    Spec Grav, UA 1.015    Blood, UA trace-intact    pH, UA 5.5    Protein, UA neg    Urobilinogen, UA 0.2    Nitrite, UA neg    Leukocytes, UA moderate (2+)          Assessment & Plan:   This chart was scribed in my presence and reviewed by me personally.    ICD-9-CM ICD-10-CM   1. Dysuria 788.1 R30.0 POCT UA - Microscopic Only     POCT urinalysis dipstick     Urine culture     nitrofurantoin, macrocrystal-monohydrate, (MACROBID) 100 MG capsule     Urine culture  2. Flank pain 789.09 R10.9 POCT UA - Microscopic Only     POCT urinalysis dipstick     Urine culture     nitrofurantoin, macrocrystal-monohydrate, (MACROBID) 100 MG capsule     Urine culture  3. Nocturia 788.43 R35.1 POCT UA - Microscopic Only     POCT urinalysis dipstick  Urine culture     nitrofurantoin, macrocrystal-monohydrate, (MACROBID) 100 MG capsule     Urine culture  4. Lower abdominal pain 789.09 R10.30 POCT UA - Microscopic Only     POCT urinalysis dipstick     Urine culture     nitrofurantoin, macrocrystal-monohydrate, (MACROBID) 100 MG capsule     Urine culture     Signed, Donna Haber, MD

## 2015-02-18 LAB — URINE CULTURE: Colony Count: 80000

## 2015-07-22 DIAGNOSIS — H18529 Epithelial (juvenile) corneal dystrophy, unspecified eye: Secondary | ICD-10-CM | POA: Insufficient documentation

## 2015-08-11 ENCOUNTER — Ambulatory Visit (INDEPENDENT_AMBULATORY_CARE_PROVIDER_SITE_OTHER): Payer: Medicare Other

## 2015-08-11 ENCOUNTER — Ambulatory Visit: Payer: Medicare Other

## 2015-08-11 ENCOUNTER — Ambulatory Visit (INDEPENDENT_AMBULATORY_CARE_PROVIDER_SITE_OTHER): Payer: Medicare Other | Admitting: Podiatry

## 2015-08-11 ENCOUNTER — Encounter: Payer: Self-pay | Admitting: Podiatry

## 2015-08-11 ENCOUNTER — Telehealth: Payer: Self-pay | Admitting: *Deleted

## 2015-08-11 VITALS — BP 123/61 | HR 79 | Resp 16 | Ht 65.5 in | Wt 238.0 lb

## 2015-08-11 DIAGNOSIS — Q828 Other specified congenital malformations of skin: Secondary | ICD-10-CM

## 2015-08-11 DIAGNOSIS — M7751 Other enthesopathy of right foot: Secondary | ICD-10-CM

## 2015-08-11 DIAGNOSIS — M79671 Pain in right foot: Secondary | ICD-10-CM

## 2015-08-11 DIAGNOSIS — M79672 Pain in left foot: Secondary | ICD-10-CM | POA: Diagnosis not present

## 2015-08-11 DIAGNOSIS — M722 Plantar fascial fibromatosis: Secondary | ICD-10-CM | POA: Diagnosis not present

## 2015-08-11 DIAGNOSIS — M778 Other enthesopathies, not elsewhere classified: Secondary | ICD-10-CM

## 2015-08-11 DIAGNOSIS — M779 Enthesopathy, unspecified: Secondary | ICD-10-CM

## 2015-08-11 NOTE — Telephone Encounter (Signed)
PT STATES SHE DID NOT GET HER PAPERWORK AT CHECKOUT AND WOULD LIKE TO KNOW HER DIAGNOSIS.  I TOLD PT I COULD MAIL HER A COPY AND SHE WOULD HAVE THAT AS WELL AS DIRECTIONS.  PT AGREED. DONE.

## 2015-08-11 NOTE — Progress Notes (Signed)
   Subjective:    Patient ID: Donna Williamson, female    DOB: [birth year], 69 y.o.   MRN: [id number]  HPI: She presents today with a chief complaint of a painful lesion plantar aspect of the second metatarsophalangeal joint right foot. She also has pain about the midfoot right. Also complaining of pain to the plantar left heel and throughout the entire plantar aspect of the left foot. No trauma.    Review of Systems  All other systems reviewed and are negative.      Objective:   Physical Exam: 69 year old female in no apparent distress vital signs stable alert and oriented 3. Pulses are strongly palpable. Neurologic sensorium is intact as was the monofilament. Deep tendon reflexes are intact bilaterally muscle strength +5 over 5 dorsiflexion plantar flexors and inverters everters all of his musculature is intact. Orthopedic evaluation and x-rays all joints distal to the ankle range of motion without crepitation. She has pain on palpation and range of motion midfoot right. Pain on palpation left heel. Radiographs confirm osteoarthritic changes midfoot bilateral plantar fasciitis left heel. Cutaneous evaluationof the well-hydrated cutis multiple porokeratosis plantar aspect of bilateral foot and subsecond metatarsophalangeal joint right foot.        Assessment & Plan:  Plantar fascitis left capsulitis right porokeratosis bilateral.  Plan: Discussed etiology pathology conservative versus surgical therapies. Enucleated porokeratosis bilateral. Injected the dorsal aspect of the right foot as well as plantar aspect of the left heel. Kenalog and local anesthetic these injections were made. Follow-up with her in 1 month.

## 2015-08-23 ENCOUNTER — Ambulatory Visit (INDEPENDENT_AMBULATORY_CARE_PROVIDER_SITE_OTHER): Payer: Medicare Other | Admitting: Family Medicine

## 2015-08-23 VITALS — BP 134/74 | HR 117 | Temp 98.1°F | Resp 18 | Ht 65.5 in | Wt 234.0 lb

## 2015-08-23 DIAGNOSIS — S41101A Unspecified open wound of right upper arm, initial encounter: Secondary | ICD-10-CM | POA: Diagnosis not present

## 2015-08-23 DIAGNOSIS — Z23 Encounter for immunization: Secondary | ICD-10-CM

## 2015-08-23 MED ORDER — MUPIROCIN 2 % EX OINT: 1.0000 "application " | TOPICAL_OINTMENT | Freq: Two times a day (BID) | CUTANEOUS | Status: DC

## 2015-08-23 NOTE — Patient Instructions (Signed)
Gently wash the right arm with soap and water every day. Apply the ointment prescribed 3 times a day and cover with the Telfa pad and used Coban to secure it.  Report any increase in pain, redness or discharge

## 2015-08-23 NOTE — Progress Notes (Signed)
@UMFCLOGO @  This chart was scribed for Robyn Haber, MD by Thea Alken, ED Scribe. This patient was seen in room 8 and the patient's care was started at 3:14 PM.  Patient ID: Donna Williamson MRN: [id number], DOB: [birth year], 69 y.o. Date of Encounter: 08/23/2015, 3:14 PM  Primary Physician: Jerlyn Ly, MD  Chief Complaint:  Chief Complaint  Patient presents with   Cellulitis    rt injuried friday     HPI: 69 y.o. year old female with history below presents with right arm injury. Pt scrapped her right arm on a wooden cart 2 days ago while at work. States afterwards she cleaned wound and applied a kleenex on top of the wound until  she got home to bandage wound. When taking the band aid off yesterday, the band aid tore her skin. She now has redness and soreness to wound. She has not washed wound but has applied OTC cream.    Past Medical History  Diagnosis Date   Hypertension    GERD (gastroesophageal reflux disease)    Anxiety    Asthma    Allergy    Arthritis      Home Meds: Prior to Admission medications   Medication Sig Start Date End Date Taking? Authorizing Provider  ALPRAZolam Duanne Moron) 0.5 MG tablet Take 0.5 mg by mouth daily as needed. For anxiety   Yes Historical Provider, MD  Cholecalciferol (VITAMIN D3) 2000 UNITS TABS Take 1 tablet by mouth daily.   Yes Historical Provider, MD  diltiazem (CARDIZEM CD) 240 MG 24 hr capsule Take 240 capsules by mouth daily. 11/27/13  Yes Historical Provider, MD  EPIPEN 2-PAK 0.3 MG/0.3ML SOAJ injection Inject 0.3 mg into the skin as needed. 11/01/13  Yes Historical Provider, MD  estradiol (ESTRACE) 2 MG tablet Take 1 mg by mouth daily.    Yes Historical Provider, MD  FLUoxetine (PROZAC) 10 MG tablet Take 10 mg by mouth daily.   Yes Historical Provider, MD  fluticasone (FLONASE) 50 MCG/ACT nasal spray Place 2 sprays into the nose daily.   Yes Historical Provider, MD  Fluticasone-Salmeterol (ADVAIR) 100-50 MCG/DOSE AEPB Inhale 1  puff into the lungs every 12 (twelve) hours.   Yes Historical Provider, MD  ibuprofen (ADVIL,MOTRIN) 600 MG tablet Take 1 tablet (600 mg total) by mouth every 6 (six) hours as needed for pain. 09/30/12  Yes Lacretia Leigh, MD  levalbuterol [facility] HFA) 45 MCG/ACT inhaler Inhale 1-2 puffs into the lungs every 4 (four) hours as needed.   Yes Historical Provider, MD  mometasone (ELOCON) 0.1 % cream Apply 1 application topically as needed. 11/07/13  Yes Historical Provider, MD  montelukast (SINGULAIR) 10 MG tablet Take 10 mg by mouth at bedtime.   Yes Historical Provider, MD  Multiple Vitamin (MULTI VITAMIN DAILY PO) Take by mouth.   Yes Historical Provider, MD  olmesartan (BENICAR) 40 MG tablet Take 40 mg by mouth daily.   Yes Historical Provider, MD  omeprazole (PRILOSEC) 40 MG capsule Take 40 mg by mouth daily.   Yes Historical Provider, MD  PROAIR HFA 108 (90 BASE) MCG/ACT inhaler Inhale 1-2 puffs into the lungs every 4 (four) hours as needed. 11/01/13  Yes Historical Provider, MD  vitamin B-12 (CYANOCOBALAMIN) 1000 MCG tablet Take 1,000 mcg by mouth daily.   Yes Historical Provider, MD  aspirin 81 MG chewable tablet Chew 81 mg by mouth daily.    Historical Provider, MD  IRON PO Take 1 tablet by mouth daily.    Historical Provider, MD  Allergies:  Allergies  Allergen Reactions   Levaquin [Levofloxacin In D5w] Anaphylaxis   Codeine Nausea Only   Macrobid [Nitrofurantoin]    Ciprofloxacin Rash   Penicillins Rash   Sulfa Antibiotics Rash    Social History   Social History   Marital Status: Married    Spouse Name: N/A   Number of Children: N/A   Years of Education: N/A   Occupational History   Not on file.   Social History Main Topics   Smoking status: Never Smoker    Smokeless tobacco: Never Used   Alcohol Use: No   Drug Use: No   Sexual Activity: Not on file   Other Topics Concern   Not on file   Social History Narrative     Review of  Systems: Constitutional: negative for chills, fever, night sweats, weight changes, or fatigue  HEENT: negative for vision changes, hearing loss, congestion, rhinorrhea, ST, epistaxis, or sinus pressure Cardiovascular: negative for chest pain or palpitations Respiratory: negative for hemoptysis, wheezing, shortness of breath, or cough Abdominal: negative for abdominal pain, nausea, vomiting, diarrhea, or constipation Dermatological: negative for rash. Neurologic: negative for headache, dizziness, or syncope All other systems reviewed and are otherwise negative with the exception to those above and in the HPI.   Physical Exam: Blood pressure 134/74, pulse 117, temperature 98.1 F (36.7 C), temperature source Oral, resp. rate 18, height 5' 5.5" (1.664 m), weight 234 lb (106.142 kg), SpO2 96 %., Body mass index is 38.33 kg/(m^2). General: Well developed, well nourished, in no acute distress. Head: Normocephalic, atraumatic, eyes without discharge, sclera non-icteric, nares are without discharge. Bilateral auditory canals clear, TM's are without perforation, pearly grey and translucent with reflective cone of light bilaterally. Oral cavity moist, posterior pharynx without exudate, erythema, peritonsillar abscess, or post nasal drip.  Neck: Supple. No thyromegaly. Full ROM. No lymphadenopathy. Lungs: Clear bilaterally to auscultation without wheezes, rales, or rhonchi. Breathing is unlabored. Heart: RRR with S1 S2. No murmurs, rubs, or gallops appreciated. Abdomen: Soft, non-tender, non-distended with normoactive bowel sounds. No hepatomegaly. No rebound/guarding. No obvious abdominal masses. Msk:  Strength and tone normal for age. Extremities/Skin: Warm and dry. No clubbing or cyanosis. No edema. No rashes or suspicious lesions.  Wound which is 2 and half centimeters long and exposes the dermis along the distal one third Neuro: Alert and oriented X 3. Moves all extremities spontaneously. Gait is  normal. CNII-XII grossly in tact. Psych:  Responds to questions appropriately with a normal affect.    ASSESSMENT AND PLAN:  69 y.o. year old female with partial-thickness laceration/abrasion on right forearm over the distal ulna This chart was scribed in my presence and reviewed by me personally.    ICD-9-CM ICD-10-CM   1. Open wound of right upper arm without complication, initial encounter 880.03 S41.101A mupirocin ointment (BACTROBAN) 2 %     Tdap vaccine greater than or equal to 7yo IM      Signed, Robyn Haber, MD 08/23/2015 3:14 PM

## 2015-09-08 ENCOUNTER — Ambulatory Visit (INDEPENDENT_AMBULATORY_CARE_PROVIDER_SITE_OTHER): Payer: Medicare Other | Admitting: Podiatry

## 2015-09-08 ENCOUNTER — Encounter: Payer: Self-pay | Admitting: Podiatry

## 2015-09-08 VITALS — BP 132/65 | HR 80 | Resp 16

## 2015-09-08 DIAGNOSIS — M722 Plantar fascial fibromatosis: Secondary | ICD-10-CM

## 2015-09-08 DIAGNOSIS — Q828 Other specified congenital malformations of skin: Secondary | ICD-10-CM

## 2015-09-08 NOTE — Progress Notes (Signed)
She presents today for follow-up of her plantar fasciitis left foot and a painful lesion subsecond metatarsophalangeal joint right foot. Her plantar fasciitis appears to be resolving to some degree. She states that this seems to be getting considerably better however this painful lesion is making it severely painful for her to ambulate.  Objective: Vital signs are stable she is alert and oriented 3. She has pain on palpation medial calcaneal tubercle of the left heel as well as on palpation of the solitary for keratoma subsecond metatarsophalangeal joint right foot. This does appear to have some blood beneath it but does not appear to be clinically infected at this point. I debrided it sharply today there is no signs of purulence or infection. Her left foot doesn't straight pain on palpation medial calcaneal tubercle of the left heel. The much improved from previous evaluation.  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  Plan: The lesion was denucleated right foot. I reinjected the left heel with Kenalog and local anesthetic. She will continue all other conservative therapies including brace night splint and anti-inflammatories. Follow up with me in 1 month.

## 2015-09-18 ENCOUNTER — Ambulatory Visit (INDEPENDENT_AMBULATORY_CARE_PROVIDER_SITE_OTHER): Payer: Medicare Other | Admitting: Podiatry

## 2015-09-18 DIAGNOSIS — D2371 Other benign neoplasm of skin of right lower limb, including hip: Secondary | ICD-10-CM

## 2015-09-18 DIAGNOSIS — Q828 Other specified congenital malformations of skin: Secondary | ICD-10-CM

## 2015-09-21 DIAGNOSIS — Q828 Other specified congenital malformations of skin: Secondary | ICD-10-CM | POA: Insufficient documentation

## 2015-09-21 NOTE — Progress Notes (Signed)
Patient ID: Donna Williamson, female   DOB: 10-13-1946, 69 y.o.   MRN: [id number]  Subjective: 69 year old female presents the office with painful ball of her right foot. She previously saw Dr. Milinda Pointer earlier this month for keratoma on the plantar aspect of the forefoot. She states that the lesion has recurred and is painful particularly with pressure. She denies any drainage or pus. Denies any surrounding redness or red streaks. No other complaints at this time. Denies any systemic complaints such as fevers, chills, nausea, vomiting. No calf pain, chest pain, shortness of breath.  Objective: AAO 3, NAD DP/PT pulses palpable 2/4, CRT less than 3 seconds Protective sensation intact with Derrel Nip monofilament There is a single annular hyperkeratotic lesion submetatarsal 2 on the right foot. Upon debridement there is no underlying ulceration, drainage or other signs of infection. There is no surrounding erythema, ascending cellulitis. There is tenderness palpation directly at this lesion. There is decreased tenderness to palpation of the plantar medial tubercle of the calcaneus insertional plantar fashion she states it is doing much better on the left foot. There is no pain with lateral compression of calcaneus no pain vibratory sensation. No other areas of tenderness bilateral lower shoes. No other open lesions or pre-ulcerative lesions. There is no pain with calf compression, swelling, warmth, erythema.  Assessment: 69 year old female reoccurrence right submetatarsal 2 hyperkeratotic lesion, porokeratosis  Plan: -Treatment options discussed including all alternatives, risks, and complications -Lesion was debrided without, complications bleeding. No underlying ulceration. The area was cleaned. A pad was placed around the lesion followed by salinocaine in an occlusive bandage. Post procedure instructions were discussed the patient. Monitor for any clinical signs or symptoms of infection and  directed to call the office immediately should any occur or go to the ER. -Continue shoe modifications,  offloading, stretching for heel pain.  -Follow-up as scheduled with Dr. Milinda Pointer or sooner if any problems arise. In the meantime, encouraged to call the office with any questions, concerns, change in symptoms.   Celesta Gentile, DPM

## 2015-10-17 ENCOUNTER — Ambulatory Visit (INDEPENDENT_AMBULATORY_CARE_PROVIDER_SITE_OTHER): Payer: Medicare Other | Admitting: Physician Assistant

## 2015-10-17 VITALS — BP 128/80 | HR 118 | Temp 98.0°F | Resp 17 | Ht 66.5 in | Wt 236.0 lb

## 2015-10-17 DIAGNOSIS — J0100 Acute maxillary sinusitis, unspecified: Secondary | ICD-10-CM

## 2015-10-17 MED ORDER — DOXYCYCLINE HYCLATE 100 MG PO TABS: 100.0000 mg | ORAL_TABLET | Freq: Two times a day (BID) | ORAL | Status: DC

## 2015-10-17 NOTE — Progress Notes (Signed)
Donna Williamson  MRN: [id number] DOB: [birth year]  Subjective:  Pt presents to clinic with concerns for a sinus infection.  She has problems with fall allergies and has felt like they have been worse than normal this year.  She has been using her medication but about a week ago she started to develop sinus pain and pressure on the right maxillary and right frontal sinuses.  She started mucinex but the pain has continued to get worse.  She is having some headaches but no dizziness and no teeth pain.  Patient Active Problem List   Diagnosis Date Noted  . Porokeratosis 09/21/2015  . Chest pain 11/29/2013  . HTN (hypertension) 11/29/2013  . Hyperlipidemia 11/29/2013  . GERD (gastroesophageal reflux disease) 11/29/2013    Current Outpatient Prescriptions on File Prior to Visit  Medication Sig Dispense Refill  . ALPRAZolam (XANAX) 0.5 MG tablet Take 0.5 mg by mouth daily as needed. For anxiety    . aspirin 81 MG chewable tablet Chew 81 mg by mouth daily.    . Cholecalciferol (VITAMIN D3) 2000 UNITS TABS Take 1 tablet by mouth daily.    Marland Kitchen diltiazem (CARDIZEM CD) 240 MG 24 hr capsule Take 240 capsules by mouth daily.    Marland Kitchen EPIPEN 2-PAK 0.3 MG/0.3ML SOAJ injection Inject 0.3 mg into the skin as needed.    Marland Kitchen estradiol (ESTRACE) 2 MG tablet Take 1 mg by mouth daily.     Marland Kitchen FLUoxetine (PROZAC) 10 MG tablet Take 10 mg by mouth daily.    . fluticasone (FLONASE) 50 MCG/ACT nasal spray Place 2 sprays into the nose daily.    . Fluticasone-Salmeterol (ADVAIR) 100-50 MCG/DOSE AEPB Inhale 1 puff into the lungs every 12 (twelve) hours.    Marland Kitchen ibuprofen (ADVIL,MOTRIN) 600 MG tablet Take 1 tablet (600 mg total) by mouth every 6 (six) hours as needed for pain. 30 tablet 0  . IRON PO Take 1 tablet by mouth daily.    Marland Kitchen levalbuterol (XOPENEX HFA) 45 MCG/ACT inhaler Inhale 1-2 puffs into the lungs every 4 (four) hours as needed.    . mometasone (ELOCON) 0.1 % cream Apply 1 application topically as needed.    .  montelukast (SINGULAIR) 10 MG tablet Take 10 mg by mouth at bedtime.    . Multiple Vitamin (MULTI VITAMIN DAILY PO) Take by mouth.    . mupirocin ointment (BACTROBAN) 2 % Place 1 application into the nose 2 (two) times daily. 22 g 0  . olmesartan (BENICAR) 40 MG tablet Take 40 mg by mouth daily.    Marland Kitchen omeprazole (PRILOSEC) 40 MG capsule Take 40 mg by mouth daily.    Marland Kitchen PROAIR HFA 108 (90 BASE) MCG/ACT inhaler Inhale 1-2 puffs into the lungs every 4 (four) hours as needed.    . vitamin B-12 (CYANOCOBALAMIN) 1000 MCG tablet Take 1,000 mcg by mouth daily.     No current facility-administered medications on file prior to visit.    Allergies  Allergen Reactions  . Levaquin [Levofloxacin] Anaphylaxis  . Codeine Nausea Only  . Macrobid [Nitrofurantoin]   . Ciprofloxacin Rash  . Penicillins Rash  . Sulfa Antibiotics Rash    Review of Systems  Constitutional: Negative for fever and chills.  HENT: Positive for congestion, postnasal drip and sinus pressure (right side). Negative for rhinorrhea.   Respiratory: Negative for cough.   Allergic/Immunologic: Positive for environmental allergies.  Neurological: Positive for headaches. Negative for dizziness.   Objective:  BP 128/80 mmHg  Pulse 118  Temp(Src) 98  F (36.7 C) (Oral)  Resp 17  Ht 5' 6.5" (1.689 m)  Wt 236 lb (107.049 kg)  BMI 37.53 kg/m2  SpO2 92%  Physical Exam  Constitutional: She is oriented to person, place, and time and well-developed, well-nourished, and in no distress.  HENT:  Head: Normocephalic and atraumatic.  Right Ear: Hearing, tympanic membrane, external ear and ear canal normal.  Left Ear: Hearing, tympanic membrane, external ear and ear canal normal.  Nose: Right sinus exhibits maxillary sinus tenderness and frontal sinus tenderness. Left sinus exhibits no maxillary sinus tenderness and no frontal sinus tenderness.  Mouth/Throat: Uvula is midline, oropharynx is clear and moist and mucous membranes are normal.    Eyes: Conjunctivae are normal.  Neck: Normal range of motion.  Cardiovascular: Normal rate, regular rhythm and normal heart sounds.   No murmur heard. Pulmonary/Chest: Effort normal and breath sounds normal.  Neurological: She is alert and oriented to person, place, and time. Gait normal.  Skin: Skin is warm and dry.  Psychiatric: Mood, memory, affect and judgment normal.  Vitals reviewed.   Assessment and Plan :  Acute maxillary sinusitis, recurrence not specified - Plan: doxycycline (VIBRA-TABS) 100 MG tablet, Care order/instruction  Continue mucinex and allergy medication.  Stay hydrated and try nasla saline and humidified air in her home.  Windell Hummingbird PA-C  [facility] 10/17/2015 12:45 PM

## 2015-10-20 ENCOUNTER — Ambulatory Visit (INDEPENDENT_AMBULATORY_CARE_PROVIDER_SITE_OTHER): Payer: Medicare Other | Admitting: Podiatry

## 2015-10-20 ENCOUNTER — Encounter: Payer: Self-pay | Admitting: Podiatry

## 2015-10-20 VITALS — BP 140/61 | HR 99 | Resp 16

## 2015-10-20 DIAGNOSIS — M722 Plantar fascial fibromatosis: Secondary | ICD-10-CM | POA: Diagnosis not present

## 2015-10-20 DIAGNOSIS — Q828 Other specified congenital malformations of skin: Secondary | ICD-10-CM | POA: Diagnosis not present

## 2015-10-20 NOTE — Progress Notes (Signed)
She presents today for a chief complaint of a painful porokeratotic lesion subsecond metatarsal phalangeal joint right foot. She is also complaining of left heel pain.   Objective: Vital signs are stable she is alert and oriented 3. Pulses are palpable. Porokeratotic lesion subsecond metatarsophalangeal joint without iatrogenic lesions and without ulceration. She has pain on palpation medial calcaneal tubercle of the left heel.  Assessment: Porokeratotic lesion subsecond right and plantar fasciitis left. Plan: Debrided reactive hyperkeratotic porokeratotic lesion. We injected the left heel. Placed her in a plantar fascial brace will follow-up with her in 6 weeks.

## 2015-10-23 DIAGNOSIS — M1712 Unilateral primary osteoarthritis, left knee: Secondary | ICD-10-CM | POA: Insufficient documentation

## 2015-10-23 DIAGNOSIS — M1612 Unilateral primary osteoarthritis, left hip: Secondary | ICD-10-CM | POA: Insufficient documentation

## 2015-12-01 ENCOUNTER — Ambulatory Visit: Payer: Medicare Other | Admitting: Podiatry

## 2016-02-23 ENCOUNTER — Other Ambulatory Visit (INDEPENDENT_AMBULATORY_CARE_PROVIDER_SITE_OTHER): Payer: Self-pay | Admitting: Otolaryngology

## 2016-02-23 DIAGNOSIS — J329 Chronic sinusitis, unspecified: Secondary | ICD-10-CM

## 2016-02-29 ENCOUNTER — Ambulatory Visit
Admission: RE | Admit: 2016-02-29 | Discharge: 2016-02-29 | Disposition: A | Discharge status: Home / Self Care | Payer: Medicare Other | Source: Ambulatory Visit | Attending: Otolaryngology | Admitting: Otolaryngology

## 2016-02-29 DIAGNOSIS — J329 Chronic sinusitis, unspecified: Secondary | ICD-10-CM

## 2016-04-02 ENCOUNTER — Ambulatory Visit (INDEPENDENT_AMBULATORY_CARE_PROVIDER_SITE_OTHER): Payer: Medicare Other | Admitting: Family Medicine

## 2016-04-02 VITALS — BP 116/80 | HR 84 | Temp 97.5°F | Resp 17 | Ht 66.5 in | Wt 238.0 lb

## 2016-04-02 DIAGNOSIS — I872 Venous insufficiency (chronic) (peripheral): Secondary | ICD-10-CM

## 2016-04-02 DIAGNOSIS — R0609 Other forms of dyspnea: Secondary | ICD-10-CM | POA: Diagnosis not present

## 2016-04-02 DIAGNOSIS — R600 Localized edema: Secondary | ICD-10-CM | POA: Diagnosis not present

## 2016-04-02 DIAGNOSIS — I8312 Varicose veins of left lower extremity with inflammation: Secondary | ICD-10-CM | POA: Diagnosis not present

## 2016-04-02 DIAGNOSIS — I8311 Varicose veins of right lower extremity with inflammation: Secondary | ICD-10-CM

## 2016-04-02 LAB — POCT CBC
Granulocyte percent: 59.7 %G (ref 37–80)
HCT, POC: 36.7 % — AB (ref 37.7–47.9)
Hemoglobin: 12.2 g/dL (ref 12.2–16.2)
LYMPH, POC: 3.3 (ref 0.6–3.4)
MCH, POC: 29.8 pg (ref 27–31.2)
MCHC: 33.3 g/dL (ref 31.8–35.4)
MCV: 89.5 fL (ref 80–97)
MID (cbc): 0.9 (ref 0–0.9)
MPV: 6.8 fL (ref 0–99.8)
PLATELET COUNT, POC: 416 10*3/uL (ref 142–424)
POC Granulocyte: 6.3 (ref 2–6.9)
POC LYMPH %: 31.6 % (ref 10–50)
POC MID %: 8.7 %M (ref 0–12)
RBC: 4.1 M/uL (ref 4.04–5.48)
RDW, POC: 13.5 %
WBC: 10.5 10*3/uL — AB (ref 4.6–10.2)

## 2016-04-02 LAB — COMPLETE METABOLIC PANEL WITH GFR
ALT: 16 U/L (ref 6–29)
AST: 23 U/L (ref 10–35)
Albumin: 4.1 g/dL (ref 3.6–5.1)
Alkaline Phosphatase: 94 U/L (ref 33–130)
BILIRUBIN TOTAL: 0.3 mg/dL (ref 0.2–1.2)
BUN: 16 mg/dL (ref 7–25)
CO2: 22 mmol/L (ref 20–31)
CREATININE: 1.13 mg/dL — AB (ref 0.60–0.93)
Calcium: 9.7 mg/dL (ref 8.6–10.4)
Chloride: 103 mmol/L (ref 98–110)
GFR, EST AFRICAN AMERICAN: 56 mL/min — AB (ref >=60)
GFR, Est Non African American: 49 mL/min — ABNORMAL LOW (ref >=60)
GLUCOSE: 94 mg/dL (ref 65–99)
Potassium: 4.7 mmol/L (ref 3.5–5.3)
SODIUM: 139 mmol/L (ref 135–146)
TOTAL PROTEIN: 7.1 g/dL (ref 6.1–8.1)

## 2016-04-02 LAB — BRAIN NATRIURETIC PEPTIDE: BRAIN NATRIURETIC PEPTIDE: 35.9 pg/mL (ref <100)

## 2016-04-02 LAB — TSH: TSH: 2.2 m[IU]/L

## 2016-04-02 MED ORDER — TRIAMCINOLONE ACETONIDE 0.1 % EX CREA: 1.0000 "application " | TOPICAL_CREAM | Freq: Two times a day (BID) | CUTANEOUS | Status: DC

## 2016-04-02 NOTE — Patient Instructions (Addendum)
IF you received an x-ray today, you will receive an invoice from [facility]. Please contact [facility] at [phone] with questions or concerns regarding your invoice.   IF you received labwork today, you will receive an invoice from [employer]. Please contact [facility] at [phone] with questions or concerns regarding your invoice.   Our billing staff will not be able to assist you with questions regarding bills from these companies.  You will be contacted with the lab results as soon as they are available. The fastest way to get your results is to activate your My Chart account. Instructions are located on the last page of this paperwork. If you have not heard from [place] regarding the results in 2 weeks, please contact this office.     We recommend that you schedule a mammogram for breast cancer screening. Typically, you do not need a referral to do this. Please contact a local imaging center to schedule your mammogram.  [facility] - [phone]  *ask for the Radiology Department [facility]) - [phone] or [phone]  [facility] - [phone] [place] [phone] [facility] - [phone]  *ask for the [facility] - [phone]  *ask for the Radiology Department [facility] - [phone]  *ask for the Mammography Department [facility] - [phone]  I suspect some of the itching is due to the swelling in your legs. It could also be due to a reaction to your compression stockings, but this is less likely. Okay to use the triamcinolone steroid cream twice per day as needed to the itching areas, antibiotic ointment if needed, but if you do have increased redness or discharge from the wounds, return here or your primary care provider for recheck.  Your white blood cell count was borderline  elevated, but I do not see any sign of infection at this time. Again if you are having more redness or discharge from the wounds, be seen right away.  For the lower extremity swelling, I am checking some other lab work. Increase Lasix to 2 pills today and tomorrow, then return to one pill per day starting Monday. Elevate legs as much as possible today and tomorrow, and if your symptoms are not improving into next week, recommend you call your primary care provider to determine the next step. If any chest pain, worsening shortness breath or other worsening symptoms sooner, return for recheck here or emergency room.  Edema Edema is an abnormal buildup of fluids in your bodytissues. Edema is somewhatdependent on gravity to pull the fluid to the lowest place in your body. That makes the condition more common in the legs and thighs (lower extremities). Painless swelling of the feet and ankles is common and becomes more likely as you get older. It is also common in looser tissues, like around your eyes.  When the affected area is squeezed, the fluid may move out of that spot and leave a dent for a few moments. This dent is called pitting.  CAUSES  There are many possible causes of edema. Eating too much salt and being on your feet or sitting for a long time can cause edema in your legs and ankles. Hot weather may make edema worse. Common medical causes of edema include:  Heart failure.  Liver disease.  Kidney disease.  Weak blood vessels in your legs.  Cancer.  An injury.  Pregnancy.  Some medications.  Obesity. SYMPTOMS  Edema is usually painless.Your skin may look swollen or shiny.  DIAGNOSIS  Your health care provider may be able to diagnose edema by asking about your medical history and doing a physical exam. You may need to have tests such as X-rays, an electrocardiogram, or blood tests to check for medical conditions that may cause edema.  TREATMENT  Edema treatment depends on the  cause. If you have heart, liver, or kidney disease, you need the treatment appropriate for these conditions. General treatment may include:  Elevation of the affected body part above the level of your heart.  Compression of the affected body part. Pressure from elastic bandages or support stockings squeezes the tissues and forces fluid back into the blood vessels. This keeps fluid from entering the tissues.  Restriction of fluid and salt intake.  Use of a water pill (diuretic). These medications are appropriate only for some types of edema. They pull fluid out of your body and make you urinate more often. This gets rid of fluid and reduces swelling, but diuretics can have side effects. Only use diuretics as directed by your health care provider. HOME CARE INSTRUCTIONS   Keep the affected body part above the level of your heart when you are lying down.   Do not sit still or stand for prolonged periods.   Do not put anything directly under your knees when lying down.  Do not wear constricting clothing or garters on your upper legs.   Exercise your legs to work the fluid back into your blood vessels. This may help the swelling go down.   Wear elastic bandages or support stockings to reduce ankle swelling as directed by your health care provider.   Eat a low-salt diet to reduce fluid if your health care provider recommends it.   Only take medicines as directed by your health care provider. SEEK MEDICAL CARE IF:   Your edema is not responding to treatment.  You have heart, liver, or kidney disease and notice symptoms of edema.  You have edema in your legs that does not improve after elevating them.   You have sudden and unexplained weight gain. SEEK IMMEDIATE MEDICAL CARE IF:   You develop shortness of breath or chest pain.   You cannot breathe when you lie down.  You develop pain, redness, or warmth in the swollen areas.   You have heart, liver, or kidney disease and  suddenly get edema.  You have a fever and your symptoms suddenly get worse. MAKE SURE YOU:   Understand these instructions.  Will watch your condition.  Will get help right away if you are not doing well or get worse.   This information is not intended to replace advice given to you by your health care provider. Make sure you discuss any questions you have with your health care provider.   Document Released: 10/17/2005 Document Revised: 11/07/2014 Document Reviewed: 08/09/2013 Elsevier Interactive Patient Education [employer].

## 2016-04-02 NOTE — Progress Notes (Addendum)
By signing my name below I, Donna Williamson, attest that this documentation has been prepared under the direction and in the presence of Donna Agreste, MD. Electonically Signed. Donna Williamson, Scribe 04/02/2016 at 11:46 AM  Subjective:    Patient ID: Donna Williamson, female    DOB: 1946/07/13, 70 y.o.   MRN: [id number]  Chief Complaint  Patient presents with  . Rash    on legs and itching     Rash Pertinent negatives include no fatigue or shortness of breath.   [name] is a 70 y.o. female who presents to the [facility] complaining of pruritic rash for the past week on legs bilat. Pt states that rash started after she tried on compression stockings a week ago.   Pt states that she is having leg swelling for the past 2 weeks despite taking lasix and potassium which usually resolves her symptoms. Pt tried compression socks to help with swelling a week ago and stopped using it because the rash and itching started.  Pt has history of bilat lower leg edema that is resolved with lasix and potassium. Pt states that she usually does not have rash with swelling.  Pt denies any CP, wheezing. Pt has chronic mild SOB while walking. Pt reports that the SOB is because she is out of shape and obese and has been unchanged for years.   PT has history of allergies, HTN, HLD, and GERD.    Patient Active Problem List   Diagnosis Date Noted  . Porokeratosis 09/21/2015  . Chest pain 11/29/2013  . HTN (hypertension) 11/29/2013  . Hyperlipidemia 11/29/2013  . GERD (gastroesophageal reflux disease) 11/29/2013   Past Medical History  Diagnosis Date  . Hypertension   . GERD (gastroesophageal reflux disease)   . Anxiety   . Asthma   . Allergy   . Arthritis    Past Surgical History  Procedure Laterality Date  . Abdominal hysterectomy    . Hip orif w/ capsulotomy  Left  . Joint replacement     Allergies  Allergen Reactions  . Levaquin [Levofloxacin] Anaphylaxis    . Codeine Nausea Only  . Macrobid [Nitrofurantoin]   . Ciprofloxacin Rash  . Penicillins Rash  . Sulfa Antibiotics Rash   Prior to Admission medications   Medication Sig Start Date End Date Taking? Authorizing Provider  ALPRAZolam Duanne Moron) 0.5 MG tablet Take 0.5 mg by mouth daily as needed. For anxiety   Yes Historical Provider, MD  aspirin 81 MG chewable tablet Chew 81 mg by mouth daily.   Yes Historical Provider, MD  Cholecalciferol (VITAMIN D3) 2000 UNITS TABS Take 1 tablet by mouth daily.   Yes Historical Provider, MD  diltiazem (CARDIZEM CD) 240 MG 24 hr capsule Take 240 capsules by mouth daily. 11/27/13  Yes Historical Provider, MD  EPIPEN 2-PAK 0.3 MG/0.3ML SOAJ injection Inject 0.3 mg into the skin as needed. 11/01/13  Yes Historical Provider, MD  estradiol (ESTRACE) 2 MG tablet Take 1 mg by mouth daily.    Yes Historical Provider, MD  FLUoxetine (PROZAC) 10 MG tablet Take 10 mg by mouth daily.   Yes Historical Provider, MD  fluticasone (FLONASE) 50 MCG/ACT nasal spray Place 2 sprays into the nose daily.   Yes Historical Provider, MD  Fluticasone-Salmeterol (ADVAIR) 100-50 MCG/DOSE AEPB Inhale 1 puff into the lungs every 12 (twelve) hours.   Yes Historical Provider, MD  ibuprofen (ADVIL,MOTRIN) 600 MG tablet Take 1 tablet (600 mg total) by mouth every  6 (six) hours as needed for pain. 09/30/12  Yes Lacretia Leigh, MD  IRON PO Take 1 tablet by mouth daily.   Yes Historical Provider, MD  levalbuterol [facility] HFA) 45 MCG/ACT inhaler Inhale 1-2 puffs into the lungs every 4 (four) hours as needed.   Yes Historical Provider, MD  mometasone (ELOCON) 0.1 % cream Apply 1 application topically as needed. 11/07/13  Yes Historical Provider, MD  montelukast (SINGULAIR) 10 MG tablet Take 10 mg by mouth at bedtime.   Yes Historical Provider, MD  Multiple Vitamin (MULTI VITAMIN DAILY PO) Take by mouth.   Yes Historical Provider, MD  mupirocin ointment (BACTROBAN) 2 % Place 1 application into the nose 2  (two) times daily. 08/23/15  Yes Robyn Haber, MD  olmesartan (BENICAR) 40 MG tablet Take 40 mg by mouth daily.   Yes Historical Provider, MD  omeprazole (PRILOSEC) 40 MG capsule Take 40 mg by mouth daily.   Yes Historical Provider, MD  PROAIR HFA 108 (90 BASE) MCG/ACT inhaler Inhale 1-2 puffs into the lungs every 4 (four) hours as needed. 11/01/13  Yes Historical Provider, MD   Social History   Social History  . Marital Status: Married    Spouse Name: N/A  . Number of Children: N/A  . Years of Education: N/A   Occupational History  . Not on file.   Social History Main Topics  . Smoking status: Never Smoker   . Smokeless tobacco: Never Used  . Alcohol Use: No  . Drug Use: No  . Sexual Activity: Not on file   Other Topics Concern  . Not on file   Social History Narrative      Review of Systems  Constitutional: Negative for fatigue and unexpected weight change.  Respiratory: Negative for chest tightness and shortness of breath.   Cardiovascular: Positive for leg swelling. Negative for chest pain and palpitations.  Gastrointestinal: Negative for abdominal pain and blood in stool.  Musculoskeletal:       Pt is negative for leg pain  Skin: Positive for rash.  Neurological: Negative for dizziness, syncope, light-headedness and headaches.       Objective:   Physical Exam  Constitutional: She is oriented to person, place, and time. She appears well-developed and well-nourished.  HENT:  Head: Normocephalic and atraumatic.  Eyes: Conjunctivae and EOM are normal. Pupils are equal, round, and reactive to light.  Neck: Carotid bruit is not present.  Cardiovascular: Normal rate, regular rhythm, normal heart sounds and intact distal pulses.  Exam reveals no gallop and no friction rub.   No murmur heard. Pulmonary/Chest: Effort normal and breath sounds normal. No accessory muscle usage. She has no decreased breath sounds. She has no wheezes. She has no rhonchi. She has no rales.    Abdominal: Soft. She exhibits no pulsatile midline mass. There is no tenderness.  Musculoskeletal:  Pt has diffuse pitting edema in her lower legs bilat. There is no tenderness.  Neurological: She is alert and oriented to person, place, and time.  Skin: Skin is warm and dry.  Pt has slight erythema and stasis changes to bilat lower legs and a few excoriated areas anteriorly, no discharge, no bleeding.   Psychiatric: She has a normal mood and affect. Her behavior is normal.  Vitals reviewed.     Filed Vitals:   04/02/16 1037  BP: 116/80  Pulse: 84  Temp: 97.5 F (36.4 C)  TempSrc: Oral  Resp: 17  Height: 5' 6.5" (1.689 m)  Weight: 238 lb (107.956  kg)  SpO2: 94%   Results for orders placed or performed in visit on 04/02/16  POCT CBC  Result Value Ref Range   WBC 10.5 (A) 4.6 - 10.2 K/uL   Lymph, poc 3.3 0.6 - 3.4   POC LYMPH PERCENT 31.6 10 - 50 %L   MID (cbc) 0.9 0 - 0.9   POC MID % 8.7 0 - 12 %M   POC Granulocyte 6.3 2 - 6.9   Granulocyte percent 59.7 37 - 80 %G   RBC 4.10 4.04 - 5.48 M/uL   Hemoglobin 12.2 12.2 - 16.2 g/dL   HCT, POC 36.7 (A) 37.7 - 47.9 %   MCV 89.5 80 - 97 fL   MCH, POC 29.8 27 - 31.2 pg   MCHC 33.3 31.8 - 35.4 g/dL   RDW, POC 13.5 %   Platelet Count, POC 416 142 - 424 K/uL   MPV 6.8 0 - 99.8 fL         Assessment & Plan:     ARIADNE LANDO is a 70 y.o. female Bilateral leg edema - Plan: Brain natriuretic peptide, COMPLETE METABOLIC PANEL WITH GFR, POCT CBC, TSH  Dyspnea on exertion - Plan: Brain natriuretic peptide, COMPLETE METABOLIC PANEL WITH GFR, POCT CBC  Stasis dermatitis of both legs - Plan: triamcinolone cream (KENALOG) 0.1 %  Resistant pedal edema. Usually resolves with single dosing of Lasix. Long-standing dyspnea on exertion, likely related to weight. Denies chest pain or new symptoms. Suspected stasis dermatitis, no apparent sign of cellulitis at this time, but signs and symptoms of this were discussed and RTC  precautions. Differential diagnosis also includes possible contact dermatitis from the compression stockings, but less likely.  -Advised to try additional dose of Lasix for 2 days, elevate legs, triamcinolone cream as needed for itching areas, and if not improving into next week, follow with primary care provider.  -check BNP for dyspnea on exertion, can also discuss with PCP.  -RTC/ER precautions discussed.  Meds ordered this encounter  Medications  . triamcinolone cream (KENALOG) 0.1 %    Sig: Apply 1 application topically 2 (two) times daily.    Dispense:  30 g    Refill:  0   Patient Instructions       IF you received an x-ray today, you will receive an invoice from [facility]. Please contact [facility] at [phone] with questions or concerns regarding your invoice.   IF you received labwork today, you will receive an invoice from [employer]. Please contact [facility] at [phone] with questions or concerns regarding your invoice.   Our billing staff will not be able to assist you with questions regarding bills from these companies.  You will be contacted with the lab results as soon as they are available. The fastest way to get your results is to activate your My Chart account. Instructions are located on the last page of this paperwork. If you have not heard from [place] regarding the results in 2 weeks, please contact this office.     We recommend that you schedule a mammogram for breast cancer screening. Typically, you do not need a referral to do this. Please contact a local imaging center to schedule your mammogram.  [facility] - [phone]  *ask for the Radiology [place] - [phone] or [phone]  [place] [phone] [place] [phone] [facility] - [phone]  *ask for the Radiology  [facility] - [phone]  *ask for the Radiology Department [facility] - [phone]  *ask for the [place] - [phone]  I suspect some of the itching is due to the swelling in her legs. It could also be due to a reaction to your compression stockings, but this is less likely. Okay to use the triamcinolone steroid cream twice per day as needed to the itching areas, anabolic ointment if needed, but if you do have increased redness or discharge from the wounds, return here or your primary care provider for recheck.  For the lower extremity swelling, I am checking some other lab work. Increase Lasix to 2 pills today and tomorrow, then return to one pill per day starting Monday. Elevate legs as much as possible today and tomorrow, and if your symptoms are not improving into next week, recommend you call your primary care provider to determine the next step. If any chest pain, worsening shortness breath or other worsening symptoms sooner, return for recheck here or emergency room.  Edema Edema is an abnormal buildup of fluids in your bodytissues. Edema is somewhatdependent on gravity to pull the fluid to the lowest place in your body. That makes the condition more common in the legs and thighs (lower extremities). Painless swelling of the feet and ankles is common and becomes more likely as you get older. It is also common in looser tissues, like around your eyes.  When the affected area is squeezed, the fluid may move out of that spot and leave a dent for a few moments. This dent is called pitting.  CAUSES  There are many possible causes of edema. Eating too much salt and being on your feet or sitting for a long time can cause edema in your legs and ankles. Hot weather may make edema worse. Common medical causes of edema include:  Heart failure.  Liver disease.  Kidney disease.  Weak blood vessels in your legs.  Cancer.  An  injury.  Pregnancy.  Some medications.  Obesity. SYMPTOMS  Edema is usually painless.Your skin may look swollen or shiny.  DIAGNOSIS  Your health care provider may be able to diagnose edema by asking about your medical history and doing a physical exam. You may need to have tests such as X-rays, an electrocardiogram, or blood tests to check for medical conditions that may cause edema.  TREATMENT  Edema treatment depends on the cause. If you have heart, liver, or kidney disease, you need the treatment appropriate for these conditions. General treatment may include:  Elevation of the affected body part above the level of your heart.  Compression of the affected body part. Pressure from elastic bandages or support stockings squeezes the tissues and forces fluid back into the blood vessels. This keeps fluid from entering the tissues.  Restriction of fluid and salt intake.  Use of a water pill (diuretic). These medications are appropriate only for some types of edema. They pull fluid out of your body and make you urinate more often. This gets rid of fluid and reduces swelling, but diuretics can have side effects. Only use diuretics as directed by your health care provider. HOME CARE INSTRUCTIONS   Keep the affected body part above the level of your heart when you are lying down.   Do not sit still or stand for prolonged periods.   Do not put anything directly under your knees when lying down.  Do not wear constricting  clothing or garters on your upper legs.   Exercise your legs to work the fluid back into your blood vessels. This may help the swelling go down.   Wear elastic bandages or support stockings to reduce ankle swelling as directed by your health care provider.   Eat a low-salt diet to reduce fluid if your health care provider recommends it.   Only take medicines as directed by your health care provider. SEEK MEDICAL CARE IF:   Your edema is not responding to  treatment.  You have heart, liver, or kidney disease and notice symptoms of edema.  You have edema in your legs that does not improve after elevating them.   You have sudden and unexplained weight gain. SEEK IMMEDIATE MEDICAL CARE IF:   You develop shortness of breath or chest pain.   You cannot breathe when you lie down.  You develop pain, redness, or warmth in the swollen areas.   You have heart, liver, or kidney disease and suddenly get edema.  You have a fever and your symptoms suddenly get worse. MAKE SURE YOU:   Understand these instructions.  Will watch your condition.  Will get help right away if you are not doing well or get worse.   This information is not intended to replace advice given to you by your health care provider. Make sure you discuss any questions you have with your health care provider.   Document Released: 10/17/2005 Document Revised: 11/07/2014 Document Reviewed: 08/09/2013 Elsevier Interactive Patient Education [employer].     I personally performed the services described in this documentation, which was scribed in my presence. The recorded information has been reviewed and considered, and addended by me as needed.   Signed,   Merri Ray, MD [facility].  04/02/2016 12:20 PM

## 2016-05-01 ENCOUNTER — Encounter (HOSPITAL_COMMUNITY): Payer: Self-pay | Admitting: Emergency Medicine

## 2016-05-01 ENCOUNTER — Emergency Department (HOSPITAL_COMMUNITY): Payer: Medicare Other

## 2016-05-01 ENCOUNTER — Inpatient Hospital Stay (HOSPITAL_COMMUNITY)
Admission: EM | Admit: 2016-05-01 | Discharge: 2016-05-04 | DRG: 246 | Disposition: A | Discharge status: Home / Self Care | Payer: Medicare Other | Attending: Internal Medicine | Admitting: Internal Medicine

## 2016-05-01 ENCOUNTER — Inpatient Hospital Stay (HOSPITAL_COMMUNITY): Payer: Medicare Other

## 2016-05-01 DIAGNOSIS — R7989 Other specified abnormal findings of blood chemistry: Secondary | ICD-10-CM

## 2016-05-01 DIAGNOSIS — I214 Non-ST elevation (NSTEMI) myocardial infarction: Secondary | ICD-10-CM | POA: Diagnosis present

## 2016-05-01 DIAGNOSIS — D649 Anemia, unspecified: Secondary | ICD-10-CM | POA: Diagnosis present

## 2016-05-01 DIAGNOSIS — G629 Polyneuropathy, unspecified: Secondary | ICD-10-CM | POA: Diagnosis present

## 2016-05-01 DIAGNOSIS — I130 Hypertensive heart and chronic kidney disease with heart failure and stage 1 through stage 4 chronic kidney disease, or unspecified chronic kidney disease: Secondary | ICD-10-CM | POA: Diagnosis present

## 2016-05-01 DIAGNOSIS — I5031 Acute diastolic (congestive) heart failure: Secondary | ICD-10-CM | POA: Diagnosis not present

## 2016-05-01 DIAGNOSIS — R748 Abnormal levels of other serum enzymes: Secondary | ICD-10-CM | POA: Diagnosis present

## 2016-05-01 DIAGNOSIS — R739 Hyperglycemia, unspecified: Secondary | ICD-10-CM | POA: Diagnosis present

## 2016-05-01 DIAGNOSIS — I5032 Chronic diastolic (congestive) heart failure: Secondary | ICD-10-CM | POA: Insufficient documentation

## 2016-05-01 DIAGNOSIS — N179 Acute kidney failure, unspecified: Secondary | ICD-10-CM | POA: Diagnosis not present

## 2016-05-01 DIAGNOSIS — J9601 Acute respiratory failure with hypoxia: Secondary | ICD-10-CM | POA: Diagnosis present

## 2016-05-01 DIAGNOSIS — R079 Chest pain, unspecified: Secondary | ICD-10-CM

## 2016-05-01 DIAGNOSIS — Z955 Presence of coronary angioplasty implant and graft: Secondary | ICD-10-CM

## 2016-05-01 DIAGNOSIS — F329 Major depressive disorder, single episode, unspecified: Secondary | ICD-10-CM | POA: Diagnosis present

## 2016-05-01 DIAGNOSIS — E669 Obesity, unspecified: Secondary | ICD-10-CM | POA: Diagnosis present

## 2016-05-01 DIAGNOSIS — R0600 Dyspnea, unspecified: Secondary | ICD-10-CM

## 2016-05-01 DIAGNOSIS — D638 Anemia in other chronic diseases classified elsewhere: Secondary | ICD-10-CM | POA: Diagnosis present

## 2016-05-01 DIAGNOSIS — I10 Essential (primary) hypertension: Secondary | ICD-10-CM | POA: Diagnosis not present

## 2016-05-01 DIAGNOSIS — N183 Chronic kidney disease, stage 3 (moderate): Secondary | ICD-10-CM | POA: Diagnosis present

## 2016-05-01 DIAGNOSIS — Z7982 Long term (current) use of aspirin: Secondary | ICD-10-CM

## 2016-05-01 DIAGNOSIS — I5033 Acute on chronic diastolic (congestive) heart failure: Secondary | ICD-10-CM | POA: Diagnosis present

## 2016-05-01 DIAGNOSIS — K219 Gastro-esophageal reflux disease without esophagitis: Secondary | ICD-10-CM | POA: Diagnosis present

## 2016-05-01 DIAGNOSIS — E785 Hyperlipidemia, unspecified: Secondary | ICD-10-CM | POA: Diagnosis present

## 2016-05-01 DIAGNOSIS — Z7951 Long term (current) use of inhaled steroids: Secondary | ICD-10-CM | POA: Diagnosis not present

## 2016-05-01 DIAGNOSIS — E872 Acidosis: Secondary | ICD-10-CM | POA: Diagnosis present

## 2016-05-01 DIAGNOSIS — R778 Other specified abnormalities of plasma proteins: Secondary | ICD-10-CM

## 2016-05-01 DIAGNOSIS — J45901 Unspecified asthma with (acute) exacerbation: Secondary | ICD-10-CM | POA: Diagnosis present

## 2016-05-01 DIAGNOSIS — T380X5A Adverse effect of glucocorticoids and synthetic analogues, initial encounter: Secondary | ICD-10-CM | POA: Diagnosis present

## 2016-05-01 DIAGNOSIS — F32A Depression, unspecified: Secondary | ICD-10-CM

## 2016-05-01 DIAGNOSIS — F419 Anxiety disorder, unspecified: Secondary | ICD-10-CM | POA: Diagnosis present

## 2016-05-01 DIAGNOSIS — I2510 Atherosclerotic heart disease of native coronary artery without angina pectoris: Secondary | ICD-10-CM | POA: Diagnosis not present

## 2016-05-01 DIAGNOSIS — J189 Pneumonia, unspecified organism: Secondary | ICD-10-CM

## 2016-05-01 DIAGNOSIS — J45909 Unspecified asthma, uncomplicated: Secondary | ICD-10-CM | POA: Insufficient documentation

## 2016-05-01 HISTORY — DX: Pneumonia, unspecified organism: J18.9

## 2016-05-01 HISTORY — DX: Other specified postprocedural states: Z98.890

## 2016-05-01 HISTORY — DX: Nausea with vomiting, unspecified: R11.2

## 2016-05-01 HISTORY — DX: Other chronic pain: G89.29

## 2016-05-01 HISTORY — DX: Low back pain, unspecified: M54.50

## 2016-05-01 HISTORY — DX: Unspecified chronic bronchitis: J42

## 2016-05-01 HISTORY — DX: Nonrheumatic mitral (valve) prolapse: I34.1

## 2016-05-01 HISTORY — DX: Chest pain, unspecified: R07.9

## 2016-05-01 HISTORY — DX: Anemia, unspecified: D64.9

## 2016-05-01 HISTORY — DX: Other seasonal allergic rhinitis: J30.2

## 2016-05-01 HISTORY — DX: Non-ST elevation (NSTEMI) myocardial infarction: I21.4

## 2016-05-01 HISTORY — DX: Sjogren syndrome, unspecified: M35.00

## 2016-05-01 HISTORY — DX: Low back pain: M54.5

## 2016-05-01 HISTORY — DX: Personal history of other medical treatment: Z92.89

## 2016-05-01 HISTORY — DX: Atherosclerotic heart disease of native coronary artery without angina pectoris: I25.10

## 2016-05-01 LAB — MAGNESIUM: MAGNESIUM: 1.9 mg/dL (ref 1.7–2.4)

## 2016-05-01 LAB — GLUCOSE, CAPILLARY
GLUCOSE-CAPILLARY: 224 mg/dL — AB (ref 65–99)
GLUCOSE-CAPILLARY: 180 mg/dL — AB (ref 65–99)

## 2016-05-01 LAB — ECHOCARDIOGRAM COMPLETE
E decel time: 151 msec
EERAT: 11.3
FS: 33 % (ref 28–44)
IV/PV OW: 1.23
LA diam end sys: 47 mm
LA diam index: 2.02 cm/m2
LA vol A4C: 47.4 ml
LA vol index: 25.8 mL/m2
LA vol: 60.2 mL
LASIZE: 47 mm
LV E/e'average: 11.3
LV TDI E'LATERAL: 14.6
LV e' LATERAL: 14.6 cm/s
LVEEMED: 11.3
LVOT VTI: 21.5 cm
LVOT area: 4.15 cm2
LVOT diameter: 23 mm
LVOTPV: 94.6 cm/s
LVOTSV: 89 mL
MV Dec: 151
MV pk E vel: 165 m/s
MVPG: 11 mmHg
PW: 10.5 mm — AB (ref 0.6–1.1)
TDI e' medial: 16.3
WEIGHTICAEL: 3901.26 [oz_av]

## 2016-05-01 LAB — BASIC METABOLIC PANEL
ANION GAP: 10 (ref 5–15)
BUN: 21 mg/dL — ABNORMAL HIGH (ref 6–20)
CALCIUM: 9.3 mg/dL (ref 8.9–10.3)
CHLORIDE: 107 mmol/L (ref 101–111)
CO2: 20 mmol/L — AB (ref 22–32)
CREATININE: 1.09 mg/dL — AB (ref 0.44–1.00)
GFR calc non Af Amer: 49 mL/min — ABNORMAL LOW (ref >60)
GFR, EST AFRICAN AMERICAN: 57 mL/min — AB (ref >60)
Glucose, Bld: 174 mg/dL — ABNORMAL HIGH (ref 65–99)
Potassium: 4.1 mmol/L (ref 3.5–5.1)
SODIUM: 137 mmol/L (ref 135–145)

## 2016-05-01 LAB — CBC WITH DIFFERENTIAL/PLATELET
BASOS ABS: 0 10*3/uL (ref 0.0–0.1)
BASOS PCT: 0 %
EOS ABS: 0 10*3/uL (ref 0.0–0.7)
Eosinophils Relative: 0 %
HEMATOCRIT: 32.6 % — AB (ref 36.0–46.0)
HEMOGLOBIN: 10.3 g/dL — AB (ref 12.0–15.0)
Lymphocytes Relative: 10 %
Lymphs Abs: 2.1 10*3/uL (ref 0.7–4.0)
MCH: 29 pg (ref 26.0–34.0)
MCHC: 31.6 g/dL (ref 30.0–36.0)
MCV: 91.8 fL (ref 78.0–100.0)
Monocytes Absolute: 0.8 10*3/uL (ref 0.1–1.0)
Monocytes Relative: 4 %
NEUTROS ABS: 17.4 10*3/uL — AB (ref 1.7–7.7)
NEUTROS PCT: 86 %
Platelets: 425 10*3/uL — ABNORMAL HIGH (ref 150–400)
RBC: 3.55 MIL/uL — ABNORMAL LOW (ref 3.87–5.11)
RDW: 13.6 % (ref 11.5–15.5)
WBC: 20.3 10*3/uL — AB (ref 4.0–10.5)

## 2016-05-01 LAB — URINALYSIS, ROUTINE W REFLEX MICROSCOPIC
BILIRUBIN URINE: NEGATIVE
GLUCOSE, UA: NEGATIVE mg/dL
HGB URINE DIPSTICK: NEGATIVE
KETONES UR: NEGATIVE mg/dL
Nitrite: NEGATIVE
PROTEIN: NEGATIVE mg/dL
Specific Gravity, Urine: 1.006 (ref 1.005–1.030)
pH: 6 (ref 5.0–8.0)

## 2016-05-01 LAB — HEPARIN LEVEL (UNFRACTIONATED): Heparin Unfractionated: 0.28 IU/mL — ABNORMAL LOW (ref 0.30–0.70)

## 2016-05-01 LAB — I-STAT CG4 LACTIC ACID, ED: LACTIC ACID, VENOUS: 3.18 mmol/L — AB (ref 0.5–1.9)

## 2016-05-01 LAB — I-STAT TROPONIN, ED: TROPONIN I, POC: 0.81 ng/mL — AB (ref 0.00–0.08)

## 2016-05-01 LAB — LACTIC ACID, PLASMA
LACTIC ACID, VENOUS: 6.6 mmol/L — AB (ref 0.5–1.9)
Lactic Acid, Venous: 7.2 mmol/L (ref 0.5–1.9)

## 2016-05-01 LAB — URINE MICROSCOPIC-ADD ON

## 2016-05-01 LAB — TROPONIN I
Troponin I: 0.67 ng/mL (ref <0.03)
Troponin I: 0.96 ng/mL (ref <0.03)
Troponin I: 7.03 ng/mL (ref <0.03)

## 2016-05-01 LAB — BRAIN NATRIURETIC PEPTIDE: B NATRIURETIC PEPTIDE 5: 364 pg/mL — AB (ref 0.0–100.0)

## 2016-05-01 MED ORDER — FLUOXETINE HCL 20 MG PO TABS
10.0000 mg | ORAL_TABLET | Freq: Every day | ORAL | Status: DC
  Filled 2016-05-01: qty 1

## 2016-05-01 MED ORDER — ALBUTEROL SULFATE (2.5 MG/3ML) 0.083% IN NEBU
INHALATION_SOLUTION | RESPIRATORY_TRACT | Status: AC
  Filled 2016-05-01: qty 6

## 2016-05-01 MED ORDER — GABAPENTIN 100 MG PO CAPS
100.0000 mg | ORAL_CAPSULE | Freq: Every day | ORAL | Status: DC
  Filled 2016-05-01: qty 1

## 2016-05-01 MED ORDER — ONDANSETRON HCL 4 MG PO TABS: 4.0000 mg | ORAL_TABLET | Freq: Four times a day (QID) | ORAL | Status: DC | PRN

## 2016-05-01 MED ORDER — ACETAMINOPHEN 325 MG PO TABS: 650.0000 mg | ORAL_TABLET | Freq: Four times a day (QID) | ORAL | Status: DC | PRN

## 2016-05-01 MED ORDER — GABAPENTIN 100 MG PO CAPS
100.0000 mg | ORAL_CAPSULE | Freq: Every day | ORAL | Status: DC
  Administered 2016-05-01 – 2016-05-03 (×3): 100 mg via ORAL
  Filled 2016-05-01 (×3): qty 1

## 2016-05-01 MED ORDER — PANTOPRAZOLE SODIUM 40 MG PO TBEC
80.0000 mg | DELAYED_RELEASE_TABLET | Freq: Every day | ORAL | Status: DC
  Administered 2016-05-01 – 2016-05-04 (×4): 80 mg via ORAL
  Filled 2016-05-01 (×4): qty 2

## 2016-05-01 MED ORDER — MONTELUKAST SODIUM 10 MG PO TABS
10.0000 mg | ORAL_TABLET | Freq: Every day | ORAL | Status: DC
  Administered 2016-05-01 – 2016-05-03 (×3): 10 mg via ORAL
  Filled 2016-05-01 (×3): qty 1

## 2016-05-01 MED ORDER — DILTIAZEM HCL ER COATED BEADS 240 MG PO CP24
240.0000 mg | ORAL_CAPSULE | Freq: Every day | ORAL | Status: DC
  Administered 2016-05-01 – 2016-05-04 (×4): 240 mg via ORAL
  Filled 2016-05-01 (×4): qty 1

## 2016-05-01 MED ORDER — ASPIRIN EC 325 MG PO TBEC
325.0000 mg | DELAYED_RELEASE_TABLET | Freq: Every day | ORAL | Status: DC
  Administered 2016-05-02: 325 mg via ORAL
  Filled 2016-05-01: qty 1

## 2016-05-01 MED ORDER — IPRATROPIUM-ALBUTEROL 0.5-2.5 (3) MG/3ML IN SOLN
3.0000 mL | RESPIRATORY_TRACT | Status: DC
  Administered 2016-05-01 (×3): 3 mL via RESPIRATORY_TRACT
  Filled 2016-05-01 (×2): qty 3

## 2016-05-01 MED ORDER — ACETAMINOPHEN 650 MG RE SUPP: 650.0000 mg | Freq: Four times a day (QID) | RECTAL | Status: DC | PRN

## 2016-05-01 MED ORDER — IRBESARTAN 300 MG PO TABS
300.0000 mg | ORAL_TABLET | Freq: Every day | ORAL | Status: DC
  Administered 2016-05-01: 300 mg via ORAL
  Filled 2016-05-01: qty 1

## 2016-05-01 MED ORDER — ASPIRIN 81 MG PO CHEW
324.0000 mg | CHEWABLE_TABLET | Freq: Once | ORAL | Status: AC
  Administered 2016-05-01: 324 mg via ORAL
  Filled 2016-05-01: qty 4

## 2016-05-01 MED ORDER — NITROGLYCERIN 0.4 MG SL SUBL: 0.4000 mg | SUBLINGUAL_TABLET | SUBLINGUAL | Status: DC | PRN

## 2016-05-01 MED ORDER — ALPRAZOLAM 0.5 MG PO TABS
0.5000 mg | ORAL_TABLET | Freq: Every day | ORAL | Status: DC
  Administered 2016-05-01 – 2016-05-04 (×4): 0.5 mg via ORAL
  Filled 2016-05-01 (×4): qty 1

## 2016-05-01 MED ORDER — AZTREONAM 2 G IJ SOLR
2.0000 g | Freq: Three times a day (TID) | INTRAMUSCULAR | Status: DC
  Filled 2016-05-01: qty 2

## 2016-05-01 MED ORDER — ONDANSETRON HCL 4 MG/2ML IJ SOLN
4.0000 mg | Freq: Four times a day (QID) | INTRAMUSCULAR | Status: DC | PRN
Start: 2016-05-01 — End: 2016-05-02

## 2016-05-01 MED ORDER — IOPAMIDOL (ISOVUE-370) INJECTION 76%
INTRAVENOUS | Status: AC
  Administered 2016-05-01: 100 mL
  Filled 2016-05-01: qty 100

## 2016-05-01 MED ORDER — ALBUTEROL SULFATE (2.5 MG/3ML) 0.083% IN NEBU: 2.5000 mg | INHALATION_SOLUTION | RESPIRATORY_TRACT | Status: DC | PRN

## 2016-05-01 MED ORDER — ALBUTEROL SULFATE (2.5 MG/3ML) 0.083% IN NEBU
5.0000 mg | INHALATION_SOLUTION | Freq: Once | RESPIRATORY_TRACT | Status: AC
  Administered 2016-05-01: 5 mg via RESPIRATORY_TRACT

## 2016-05-01 MED ORDER — HEPARIN (PORCINE) IN NACL 100-0.45 UNIT/ML-% IJ SOLN
1250.0000 [IU]/h | INTRAMUSCULAR | Status: DC
  Administered 2016-05-01: 1100 [IU]/h via INTRAVENOUS
  Administered 2016-05-02: 1250 [IU]/h via INTRAVENOUS
  Filled 2016-05-01 (×2): qty 250

## 2016-05-01 MED ORDER — IPRATROPIUM BROMIDE 0.02 % IN SOLN
1.0000 mg | Freq: Once | RESPIRATORY_TRACT | Status: AC
  Administered 2016-05-01: 1 mg via RESPIRATORY_TRACT
  Filled 2016-05-01: qty 5

## 2016-05-01 MED ORDER — INSULIN ASPART 100 UNIT/ML ~~LOC~~ SOLN: 0.0000 [IU] | Freq: Three times a day (TID) | SUBCUTANEOUS | Status: DC

## 2016-05-01 MED ORDER — FLUOXETINE HCL 10 MG PO CAPS
10.0000 mg | ORAL_CAPSULE | Freq: Every day | ORAL | Status: DC
  Administered 2016-05-01 – 2016-05-04 (×4): 10 mg via ORAL
  Filled 2016-05-01 (×6): qty 1

## 2016-05-01 MED ORDER — ALBUTEROL (5 MG/ML) CONTINUOUS INHALATION SOLN
15.0000 mg/h | INHALATION_SOLUTION | Freq: Once | RESPIRATORY_TRACT | Status: AC
  Administered 2016-05-01: 15 mg/h via RESPIRATORY_TRACT
  Filled 2016-05-01: qty 20

## 2016-05-01 MED ORDER — DILTIAZEM HCL ER COATED BEADS 240 MG PO CP24: 57600.0000 mg | ORAL_CAPSULE | Freq: Every day | ORAL | Status: DC

## 2016-05-01 MED ORDER — ASPIRIN 81 MG PO CHEW: 81.0000 mg | CHEWABLE_TABLET | Freq: Every day | ORAL | Status: DC

## 2016-05-01 MED ORDER — METHYLPREDNISOLONE SODIUM SUCC 125 MG IJ SOLR
80.0000 mg | Freq: Three times a day (TID) | INTRAMUSCULAR | Status: DC
  Administered 2016-05-01 – 2016-05-02 (×3): 80 mg via INTRAVENOUS
  Filled 2016-05-01 (×3): qty 2

## 2016-05-01 MED ORDER — FUROSEMIDE 10 MG/ML IJ SOLN
40.0000 mg | Freq: Two times a day (BID) | INTRAMUSCULAR | Status: DC
  Administered 2016-05-01 – 2016-05-03 (×5): 40 mg via INTRAVENOUS
  Filled 2016-05-01 (×5): qty 4

## 2016-05-01 MED ORDER — ESTRADIOL 1 MG PO TABS
1.0000 mg | ORAL_TABLET | Freq: Every day | ORAL | Status: DC
  Administered 2016-05-01 – 2016-05-04 (×5): 1 mg via ORAL
  Filled 2016-05-01 (×6): qty 1

## 2016-05-01 MED ORDER — DOXYCYCLINE HYCLATE 100 MG IV SOLR
100.0000 mg | Freq: Two times a day (BID) | INTRAVENOUS | Status: DC
  Administered 2016-05-01 – 2016-05-02 (×4): 100 mg via INTRAVENOUS
  Filled 2016-05-01 (×6): qty 100

## 2016-05-01 MED ORDER — ATORVASTATIN CALCIUM 80 MG PO TABS
80.0000 mg | ORAL_TABLET | Freq: Every day | ORAL | Status: DC
  Filled 2016-05-01: qty 1

## 2016-05-01 MED ORDER — TRAZODONE HCL 50 MG PO TABS
25.0000 mg | ORAL_TABLET | Freq: Every evening | ORAL | Status: DC | PRN
  Administered 2016-05-02: 23:00:00 25 mg via ORAL
  Filled 2016-05-01: qty 1

## 2016-05-01 MED ORDER — SENNOSIDES-DOCUSATE SODIUM 8.6-50 MG PO TABS: 1.0000 | ORAL_TABLET | Freq: Every evening | ORAL | Status: DC | PRN

## 2016-05-01 MED ORDER — MORPHINE SULFATE (PF) 2 MG/ML IV SOLN: 1.0000 mg | INTRAVENOUS | Status: DC | PRN

## 2016-05-01 MED ORDER — METHYLPREDNISOLONE SODIUM SUCC 125 MG IJ SOLR
125.0000 mg | Freq: Once | INTRAMUSCULAR | Status: AC
  Administered 2016-05-01: 125 mg via INTRAVENOUS
  Filled 2016-05-01: qty 2

## 2016-05-01 MED ORDER — LEVALBUTEROL HCL 0.63 MG/3ML IN NEBU
0.6300 mg | INHALATION_SOLUTION | Freq: Four times a day (QID) | RESPIRATORY_TRACT | Status: DC
  Administered 2016-05-01 – 2016-05-02 (×3): 0.63 mg via RESPIRATORY_TRACT
  Filled 2016-05-01 (×3): qty 3

## 2016-05-01 MED ORDER — SODIUM CHLORIDE 0.9% FLUSH
3.0000 mL | Freq: Two times a day (BID) | INTRAVENOUS | Status: DC
  Administered 2016-05-01 – 2016-05-02 (×2): 3 mL via INTRAVENOUS

## 2016-05-01 MED ORDER — DEXTROSE 5 % IV SOLN
2.0000 g | Freq: Three times a day (TID) | INTRAVENOUS | Status: DC
  Filled 2016-05-01 (×2): qty 2

## 2016-05-01 MED ORDER — SODIUM CHLORIDE 0.9 % IV SOLN
INTRAVENOUS | Status: DC
  Administered 2016-05-01: 10:00:00 via INTRAVENOUS

## 2016-05-01 MED ORDER — FUROSEMIDE 10 MG/ML IJ SOLN
40.0000 mg | Freq: Once | INTRAMUSCULAR | Status: AC
  Administered 2016-05-01: 40 mg via INTRAVENOUS
  Filled 2016-05-01: qty 4

## 2016-05-01 MED ORDER — PERFLUTREN LIPID MICROSPHERE
1.0000 mL | INTRAVENOUS | Status: AC | PRN
  Filled 2016-05-01: qty 10

## 2016-05-01 MED ORDER — BISACODYL 10 MG RE SUPP: 10.0000 mg | Freq: Every day | RECTAL | Status: DC | PRN

## 2016-05-01 MED ORDER — ENOXAPARIN SODIUM 40 MG/0.4ML ~~LOC~~ SOLN: 40.0000 mg | SUBCUTANEOUS | Status: DC

## 2016-05-01 MED ORDER — MAGNESIUM CITRATE PO SOLN: 1.0000 | Freq: Once | ORAL | Status: DC | PRN

## 2016-05-01 MED ORDER — HEPARIN BOLUS VIA INFUSION
4000.0000 [IU] | Freq: Once | INTRAVENOUS | Status: AC
  Administered 2016-05-01: 4000 [IU] via INTRAVENOUS
  Filled 2016-05-01: qty 4000

## 2016-05-01 MED ORDER — OLMESARTAN MEDOXOMIL 40 MG PO TABS
40.0000 mg | ORAL_TABLET | Freq: Every day | ORAL | Status: DC
  Administered 2016-05-02 – 2016-05-04 (×3): 40 mg via ORAL
  Filled 2016-05-01 (×7): qty 1

## 2016-05-01 NOTE — Progress Notes (Signed)
Called doctor Maudie Mercury with a lactic acid of 6.6 he gave no change in orders

## 2016-05-01 NOTE — Progress Notes (Signed)
criticial lab troponin 7.03 reported by Zelda in lab at 2012. Pt is asymptomatic, denies CP. [place] notified. Will await orders.

## 2016-05-01 NOTE — ED Provider Notes (Signed)
TIME SEEN: 4:45 AM  CHIEF COMPLAINT: Asthma exacerbation  HPI: Pt is a 70 y.o. female with history of hypertension, anxiety, asthma who presents to the emergency department with an asthma exacerbation for the past 2 days. States she is still short of breath, wheezing and had a dry cough. No fevers. Has had some chest tightness that feels similar to her prior asthma exacerbations. Symptoms worse with exertion. She does not wear oxygen at home. Has been using inhaler in May because her treatments without relief. Has noticed bilateral lower extremity swelling for the past 2-3 months which is unchanged. No pain in her legs. No history of PE or DVT. Was given albuterol in triage and states she is feeling slightly better.  ROS: See HPI Constitutional: no fever  Eyes: no drainage  ENT: no runny nose   Cardiovascular:  chest pain  Resp: SOB  GI: no vomiting GU: no dysuria Integumentary: no rash  Allergy: no hives  Musculoskeletal: no leg swelling  Neurological: no slurred speech ROS otherwise negative  PAST MEDICAL HISTORY/PAST SURGICAL HISTORY:  Past Medical History  Diagnosis Date  . Hypertension   . GERD (gastroesophageal reflux disease)   . Anxiety   . Asthma   . Allergy   . Arthritis     MEDICATIONS:  Prior to Admission medications   Medication Sig Start Date End Date Taking? Authorizing Provider  ALPRAZolam Duanne Moron) 0.5 MG tablet Take 0.5 mg by mouth daily as needed. For anxiety    Historical Provider, MD  aspirin 81 MG chewable tablet Chew 81 mg by mouth daily.    Historical Provider, MD  Cholecalciferol (VITAMIN D3) 2000 UNITS TABS Take 1 tablet by mouth daily.    Historical Provider, MD  diltiazem (CARDIZEM CD) 240 MG 24 hr capsule Take 240 capsules by mouth daily. 11/27/13   Historical Provider, MD  EPIPEN 2-PAK 0.3 MG/0.3ML SOAJ injection Inject 0.3 mg into the skin as needed. 11/01/13   Historical Provider, MD  estradiol (ESTRACE) 2 MG tablet Take 1 mg by mouth daily.      Historical Provider, MD  FLUoxetine (PROZAC) 10 MG tablet Take 10 mg by mouth daily.    Historical Provider, MD  fluticasone (FLONASE) 50 MCG/ACT nasal spray Place 2 sprays into the nose daily.    Historical Provider, MD  Fluticasone-Salmeterol (ADVAIR) 100-50 MCG/DOSE AEPB Inhale 1 puff into the lungs every 12 (twelve) hours.    Historical Provider, MD  ibuprofen (ADVIL,MOTRIN) 600 MG tablet Take 1 tablet (600 mg total) by mouth every 6 (six) hours as needed for pain. 09/30/12   Lacretia Leigh, MD  IRON PO Take 1 tablet by mouth daily.    Historical Provider, MD  levalbuterol [facility] HFA) 45 MCG/ACT inhaler Inhale 1-2 puffs into the lungs every 4 (four) hours as needed.    Historical Provider, MD  mometasone (ELOCON) 0.1 % cream Apply 1 application topically as needed. 11/07/13   Historical Provider, MD  montelukast (SINGULAIR) 10 MG tablet Take 10 mg by mouth at bedtime.    Historical Provider, MD  Multiple Vitamin (MULTI VITAMIN DAILY PO) Take by mouth.    Historical Provider, MD  mupirocin ointment (BACTROBAN) 2 % Place 1 application into the nose 2 (two) times daily. 08/23/15   Robyn Haber, MD  olmesartan (BENICAR) 40 MG tablet Take 40 mg by mouth daily.    Historical Provider, MD  omeprazole (PRILOSEC) 40 MG capsule Take 40 mg by mouth daily.    Historical Provider, MD  [facility]  108 (90 BASE) MCG/ACT inhaler Inhale 1-2 puffs into the lungs every 4 (four) hours as needed. 11/01/13   Historical Provider, MD  triamcinolone cream (KENALOG) 0.1 % Apply 1 application topically 2 (two) times daily. 04/02/16   [name], MD    ALLERGIES:  Allergies  Allergen Reactions  . Levaquin [Levofloxacin] Anaphylaxis  . Codeine Nausea Only  . Macrobid [Nitrofurantoin]   . Ciprofloxacin Rash  . Penicillins Rash  . Sulfa Antibiotics Rash    SOCIAL HISTORY:  Social History  Substance Use Topics  . Smoking status: Never Smoker   . Smokeless tobacco: Never Used  . Alcohol Use: No    FAMILY  HISTORY: Family History  Problem Relation Age of Onset  . Hypertension Mother   . Stroke Mother   . Heart disease Father   . Ovarian cancer Sister     EXAM: BP 169/91 mmHg  Pulse 109  Temp(Src) 98 F (36.7 C) (Oral)  Resp 28  SpO2 97% CONSTITUTIONAL: Alert and oriented and responds appropriately to questions. Elderly, afebrile, appears short of breath does not and respiratory distress HEAD: Normocephalic EYES: Conjunctivae clear, PERRL ENT: normal nose; no rhinorrhea; moist mucous membranes NECK: Supple, no meningismus, no LAD  CARD: RRR; S1 and S2 appreciated; no murmurs, no clicks, no rubs, no gallops RESP: Normal chest excursion without splinting the patient is tachypneic with diminished breath sounds at her bases bilaterally and diffuse wheezing. No rhonchi or rales. Slightly hypoxic on room air at rest. No significant respiratory distress. Patient is speaking full sentences. ABD/GI: Normal bowel sounds; non-distended; soft, non-tender, no rebound, no guarding, no peritoneal signs BACK:  The back appears normal and is non-tender to palpation, there is no CVA tenderness EXT: Normal ROM in all joints; non-tender to palpation; bilateral nonpitting lower extremity edema; normal capillary refill; no cyanosis, no calf tenderness or swelling    SKIN: Normal color for age and race; warm; no rash NEURO: Moves all extremities equally, sensation to light touch intact diffusely, cranial nerves II through XII intact PSYCH: The patient's mood and manner are appropriate. Grooming and personal hygiene are appropriate.  MEDICAL DECISION MAKING: Patient here shortness of breath that feels like her prior asthma exacerbations. Complaint of some chest tightness as well. Does have lower should be swelling but states this is been constant and unchanged for 3 months. We'll obtain cardiac labs including troponin and BNP. We'll obtain a chest x-ray. We'll give albuterol, Atrovent, Solu-Medrol and  reassess.  ED PROGRESS: 6:45 AM  Pt reports feeling much better after continuous breathing treatment. She now has better aeration and decreased wheezing. States that her chest tightness, heaviness is now gone. Labs show leukocytosis of 20,000 with left shift. She denies any recent fevers, productive cough, vomiting or diarrhea, headache, neck pain or neck stiffness, rash, dysuria or hematuria. She does state now that intermittently she has been short of breath with exertion. States she had a negative stress test approximately one year ago. Chest x-ray shows interstitial edema. BNP is 364. Troponin is positive at 0.81. Given her leukocytosis we have added on a lactic acid, blood cultures. We'll also obtain a rectal temperature. My suspicion that this is infectious in nature is low. Denies history of being on steroids in the last several weeks. She will need admission. Discuss with Dr. Eula Fried with cardiology. He will have the oncoming cardiology team see the patient. We will consult medicine for admission. Her PCP is Dr. Joylene Draft.  7:30 AM  D/w Judson Roch NP  with hospitalist service who has already seen the patient. She agrees that this seems like cardiac strain from asthma exacerbation, possible volume overload. She agrees this is unlikely infectious and does not think we should start antibody for this time. They will monitor her lactate closely. Cultures have been ordered. Would recommend obtaining every 6 hours troponins. She also recommends a CT of patient's chest wall pulmonary embolus. She will place holding orders for admission to telemetry, observation. Patient and her husband have been updated with this plan.   I reviewed all nursing notes, vitals, pertinent old records, EKGs, labs, imaging (as available).     EKG Interpretation  Date/Time:  Sunday May 01 2016 05:50:37 EDT Ventricular Rate:  83 PR Interval:    QRS Duration: 104 QT Interval:  403 QTC Calculation: 474 R Axis:   34 Text  Interpretation:  Sinus rhythm Baseline wander in lead(s) V2 No significant change since last tracing Confirmed by Avry Monteleone,  DO, Avalynne Diver [phone]) on 05/01/2016 5:56:27 AM        [place], DO 05/01/16 [id number]

## 2016-05-01 NOTE — ED Notes (Signed)
Unable to get temp due to pt drinking ice water she has brought with her.

## 2016-05-01 NOTE — Progress Notes (Signed)
ANTICOAGULATION CONSULT NOTE - Initial Consult  Pharmacy Consult for Heparin Indication: chest pain/ACS  Allergies  Allergen Reactions  . Levaquin [Levofloxacin] Anaphylaxis  . Codeine Nausea Only  . Macrobid [Nitrofurantoin]   . Ciprofloxacin Rash  . Penicillins Rash  . Sulfa Antibiotics Rash    Patient Measurements: Weight: 243 lb 13.3 oz (110.6 kg)  IBW: 60 kg Heparin Dosing Weight: 85.5 kg  Vital Signs: Temp: 97.2 F (36.2 C) (07/02 0730) Temp Source: Rectal (07/02 0730) BP: 144/71 mmHg (07/02 0816) Pulse Rate: 116 (07/02 0827)  Labs:  Recent Labs  05/01/16 0531  HGB 10.3*  HCT 32.6*  PLT 425*  CREATININE 1.09*  TROPONINI 0.67*    Estimated Creatinine Clearance: 59.3 mL/min (by C-G formula based on Cr of 1.09).   Medical History: Past Medical History  Diagnosis Date  . Hypertension   . GERD (gastroesophageal reflux disease)   . Anxiety   . Asthma   . Allergy   . Arthritis   . Chest pain     a. 10/2013 Exercise Myoview: Ef 65%, no ischemia.    Assessment: 89 YOF who presented to the [facility] on 7/2 with asthma exacerbation x 2 days with SOB and wheezing. The patient was also noted to have chest tightness with positive troponins so pharmacy has been consulted to start Heparin for r/o ACS.  Baseline CBC: Hgb 10.3, plts 425. The patient was not on blood thinners PTA except for a baby ASA. No recent surgeries or hx CVA noted. Hep Wt: 85.5 kg  Goal of Therapy:  Heparin level 0.3-0.7 units/ml Monitor platelets by anticoagulation protocol: Yes   Plan:  1. Heparin bolus of 4000 units x 1 2. Start Heparin at 1100 units/hr (11 ml/hr) 3. Daily HL, CBC 4. Will continue to monitor for any signs/symptoms of bleeding and will follow up with heparin level in 8 hours    Alycia Rossetti, PharmD, BCPS Clinical Pharmacist Pager: [phone] 05/01/2016 8:36 AM

## 2016-05-01 NOTE — ED Notes (Signed)
Pt returned from CT, ambulated approximately 10 ft to restroom-pt returned to room, wheezing, low O2-80% on 2L.  NRB applied-O2 increased to 98%, transitioned back to 2L.   Admitting aware, nebs ordered.

## 2016-05-01 NOTE — ED Notes (Signed)
Pt a x 4, NAD, VSS.  All belongings with pt, husband at bedside.

## 2016-05-01 NOTE — ED Notes (Signed)
Cards at bedside

## 2016-05-01 NOTE — ED Notes (Signed)
Lab tech to add on troponin

## 2016-05-01 NOTE — ED Notes (Signed)
IV attempted without success. 

## 2016-05-01 NOTE — ED Notes (Signed)
Patient transported to CT 

## 2016-05-01 NOTE — ED Notes (Signed)
Admitting at bedside 

## 2016-05-01 NOTE — H&P (Signed)
History and Physical    DEBANY WISLER [id number] DOB: February 19, 1946 DOA: 05/01/2016   PCP: Jerlyn Ly, MD   Patient coming from:  Home    Chief Complaint: Shortness of Breath   HPI: Donna Williamson is a 70 y.o. female with medical history significant for HTN, asthma, anxiety presenting with asthma exacerbation, with wheezing , dry cough and shortness of breath, as well as chest tightness. She reports these symptoms were similar to prior events. However, over the last 2 weeks she reports that she has been more fatigued, with increased difficulty to breathe, accompanied by bilateral leg swelling. The patient has been using her inhaler, but lately no relief has been reached . The patient does not wear oxygen at home. She has no history of PE or DVT. On presentation, she received breathing treatment with nebulizer, with improvement of her symptoms. She also reports that her chest tightness and tenderness has subsided. She denies any fevers, chills, productive cough, recent infections, vomiting or diarrhea. She denies any headache or neck pain, neck stiffness rashes, dysuria or hematuria.the patient denies being very active. No recent hospitalizations, long-distance trips, or surgeries. Takes hormonal supplements daily. Does not smoke.     ED Course:  BP 150/74 mmHg  Pulse 113  Temp(Src) 97.2 F (36.2 C) (Rectal)  Resp 22  Wt 110.6 kg (243 lb 13.3 oz)  SpO2 97% BNP 364 Troponin positive at 0.67_>0.81. White count 20,000.-denies being on steroids for the last several weeks. Chest x-ray shows interstitial edema. Lactic acid is elevated at 3.18. Anion gap is 10. Glucose 174 Hemoglobin 10.3  Review of Systems: As per HPI otherwise 10 point review of systems negative.   Past Medical History  Diagnosis Date  . Hypertension   . GERD (gastroesophageal reflux disease)   . Anxiety   . Asthma   . Allergy   . Arthritis   . Chest pain     a. 10/2013 Exercise Myoview: Ef 65%, no  ischemia.    Past Surgical History  Procedure Laterality Date  . Abdominal hysterectomy    . Hip orif w/ capsulotomy  Left  . Joint replacement      Social History Social History   Social History  . Marital Status: Married    Spouse Name: N/A  . Number of Children: N/A  . Years of Education: N/A   Occupational History  . Not on file.   Social History Main Topics  . Smoking status: Never Smoker   . Smokeless tobacco: Never Used  . Alcohol Use: No  . Drug Use: No  . Sexual Activity: Not on file   Other Topics Concern  . Not on file   Social History Narrative     Allergies  Allergen Reactions  . Levaquin [Levofloxacin] Anaphylaxis  . Codeine Nausea Only  . Macrobid [Nitrofurantoin]   . Ciprofloxacin Rash  . Penicillins Rash  . Sulfa Antibiotics Rash    Family History  Problem Relation Age of Onset  . Hypertension Mother   . Stroke Mother   . Heart disease Father   . Ovarian cancer Sister       Prior to Admission medications   Medication Sig Start Date End Date Taking? Authorizing Provider  ALPRAZolam Duanne Moron) 0.5 MG tablet Take 0.5 mg by mouth daily as needed. For anxiety   Yes Historical Provider, MD  aspirin 81 MG chewable tablet Chew 81 mg by mouth daily.   Yes Historical Provider, MD  Cholecalciferol (VITAMIN D3) 2000 UNITS TABS  Take 1 tablet by mouth daily.   Yes Historical Provider, MD  cyanocobalamin 500 MCG tablet Take 500 mcg by mouth daily.   Yes Historical Provider, MD  diltiazem (CARDIZEM CD) 240 MG 24 hr capsule Take 240 capsules by mouth daily. 11/27/13  Yes Historical Provider, MD  EPIPEN 2-PAK 0.3 MG/0.3ML SOAJ injection Inject 0.3 mg into the skin as needed. 11/01/13  Yes Historical Provider, MD  estradiol (ESTRACE) 2 MG tablet Take 1 mg by mouth daily.    Yes Historical Provider, MD  FLUoxetine (PROZAC) 10 MG tablet Take 10 mg by mouth daily.   Yes Historical Provider, MD  fluticasone (FLONASE) 50 MCG/ACT nasal spray Place 2 sprays into the  nose daily.   Yes Historical Provider, MD  Fluticasone-Salmeterol (ADVAIR) 100-50 MCG/DOSE AEPB Inhale 1 puff into the lungs every 12 (twelve) hours.   Yes Historical Provider, MD  gabapentin (NEURONTIN) 100 MG capsule Take 100 mg by mouth daily.   Yes Historical Provider, MD  montelukast (SINGULAIR) 10 MG tablet Take 10 mg by mouth at bedtime.   Yes Historical Provider, MD  Multiple Vitamin (MULTIVITAMIN WITH MINERALS) TABS tablet Take 1 tablet by mouth daily.   Yes Historical Provider, MD  olmesartan (BENICAR) 40 MG tablet Take 40 mg by mouth daily.   Yes Historical Provider, MD  omeprazole (PRILOSEC) 40 MG capsule Take 40 mg by mouth daily.   Yes Historical Provider, MD  PROAIR HFA 108 (90 BASE) MCG/ACT inhaler Inhale 1-2 puffs into the lungs every 4 (four) hours as needed. 11/01/13  Yes Historical Provider, MD  triamcinolone cream (KENALOG) 0.1 % Apply 1 application topically 2 (two) times daily. 04/02/16  Yes [name], MD  ibuprofen (ADVIL,MOTRIN) 600 MG tablet Take 1 tablet (600 mg total) by mouth every 6 (six) hours as needed for pain. Patient not taking: Reported on 05/01/2016 09/30/12   Lacretia Leigh, MD  mupirocin ointment (BACTROBAN) 2 % Place 1 application into the nose 2 (two) times daily. Patient not taking: Reported on 05/01/2016 08/23/15   Robyn Haber, MD    Physical Exam:    Filed Vitals:   05/01/16 0726 05/01/16 0730 05/01/16 0730 05/01/16 0740  BP:  150/74    Pulse:  128  113  Temp:   97.2 F (36.2 C)   TempSrc:   Rectal   Resp:  22    Weight: 110.6 kg (243 lb 13.3 oz)     SpO2:  95%  97%       Constitutional: NAD, calm, comfortable. Not on O2 at this time  Filed Vitals:   05/01/16 0726 05/01/16 0730 05/01/16 0730 05/01/16 0740  BP:  150/74    Pulse:  128  113  Temp:   97.2 F (36.2 C)   TempSrc:   Rectal   Resp:  22    Weight: 110.6 kg (243 lb 13.3 oz)     SpO2:  95%  97%   Eyes: PERRL, lids and conjunctivae normal ENMT: Mucous membranes are  moist. Posterior pharynx clear of any exudate or lesions.Normal dentition.  Neck: normal, supple, no masses, no thyromegaly Respiratory: clear to auscultation bilaterally, mild  Wheezing RLL , no crackles. Normal respiratory effort. No accessory muscle use.  Cardiovascular: Regular rate and rhythm, no murmurs / rubs / gallops. 1 + Lower bilateral  extremity edema. 2+ pedal pulses. No carotid bruits.  Abdomen: no tenderness, no masses palpated. No hepatosplenomegaly. Bowel sounds positive.  Musculoskeletal: no clubbing / cyanosis. No joint deformity upper and  lower extremities. Good ROM, no contractures. Normal muscle tone.  Skin: no rashes, lesions, ulcers.  Neurologic: CN 2-12 grossly intact. Sensation intact, DTR normal. Strength 5/5 in all 4.  Psychiatric: Normal judgment and insight. Alert and oriented x 3. Normal mood.     Labs on Admission: I have personally reviewed following labs and imaging studies  CBC:  Recent Labs Lab 05/01/16 0531  WBC 20.3*  NEUTROABS 17.4*  HGB 10.3*  HCT 32.6*  MCV 91.8  PLT 425*    Basic Metabolic Panel:  Recent Labs Lab 05/01/16 0531  NA 137  K 4.1  CL 107  CO2 20*  GLUCOSE 174*  BUN 21*  CREATININE 1.09*  CALCIUM 9.3    GFR: Estimated Creatinine Clearance: 59.3 mL/min (by C-G formula based on Cr of 1.09).  Liver Function Tests: No results for input(s): AST, ALT, ALKPHOS, BILITOT, PROT, ALBUMIN in the last 168 hours. No results for input(s): LIPASE, AMYLASE in the last 168 hours. No results for input(s): AMMONIA in the last 168 hours.  Coagulation Profile: No results for input(s): INR, PROTIME in the last 168 hours.  Cardiac Enzymes:  Recent Labs Lab 05/01/16 0531  TROPONINI 0.67*    BNP (last 3 results) No results for input(s): PROBNP in the last 8760 hours.  HbA1C: No results for input(s): HGBA1C in the last 72 hours.  CBG: No results for input(s): GLUCAP in the last 168 hours.  Lipid Profile: No results for  input(s): CHOL, HDL, LDLCALC, TRIG, CHOLHDL, LDLDIRECT in the last 72 hours.  Thyroid Function Tests: No results for input(s): TSH, T4TOTAL, FREET4, T3FREE, THYROIDAB in the last 72 hours.  Anemia Panel: No results for input(s): VITAMINB12, FOLATE, FERRITIN, TIBC, IRON, RETICCTPCT in the last 72 hours.  Urine analysis:    Component Value Date/Time   COLORURINE YELLOW 05/01/2016 0719   APPEARANCEUR HAZY* 05/01/2016 0719   LABSPEC 1.006 05/01/2016 0719   PHURINE 6.0 05/01/2016 0719   GLUCOSEU NEGATIVE 05/01/2016 0719   HGBUR NEGATIVE 05/01/2016 0719   BILIRUBINUR NEGATIVE 05/01/2016 0719   BILIRUBINUR neg 02/16/2015 1851   KETONESUR NEGATIVE 05/01/2016 0719   PROTEINUR NEGATIVE 05/01/2016 0719   PROTEINUR neg 02/16/2015 1851   UROBILINOGEN 0.2 02/16/2015 1851   UROBILINOGEN 0.2 11/29/2013 1552   NITRITE NEGATIVE 05/01/2016 0719   NITRITE neg 02/16/2015 1851   LEUKOCYTESUR SMALL* 05/01/2016 0719    Sepsis Labs: @LABRCNTIP (procalcitonin:4,lacticidven:4) )No results found for this or any previous visit (from the past 240 hour(s)).   Radiological Exams on Admission: Ct Angio Chest Pe W/cm &/or Wo Cm  05/01/2016  CLINICAL DATA:  Asthma attack with difficulty breathing, initial encounter EXAM: CT ANGIOGRAPHY CHEST WITH CONTRAST TECHNIQUE: Multidetector CT imaging of the chest was performed using the standard protocol during bolus administration of intravenous contrast. Multiplanar CT image reconstructions and MIPs were obtained to evaluate the vascular anatomy. CONTRAST:  100 mL Isovue 370. COMPARISON:  Plain film from earlier in the same day. FINDINGS: Mediastinum/Lymph Nodes: No pulmonary emboli or thoracic aortic dissection identified. No masses or pathologically enlarged lymph nodes identified. Coronary calcifications are noted. Stable mild pericardial fluid is noted. Lungs/Pleura: Bilateral pleural effusions right greater than left are noted. Patchy infiltrative changes are noted in  the right upper lobe. No parenchymal nodule is noted. Upper abdomen: No acute findings. Musculoskeletal: No chest wall mass or suspicious bone lesions identified. Review of the MIP images confirms the above findings. IMPRESSION: Bilateral pleural effusions. Patchy infiltrative changes are seen in the right upper lobe Electronically  Signed   By: Inez Catalina M.D.   On: 05/01/2016 08:03   Dg Chest Port 1 View  05/01/2016  CLINICAL DATA:  Dyspnea today EXAM: PORTABLE CHEST 1 VIEW COMPARISON:  11/29/2013 FINDINGS: There is mild vascular and interstitial prominence which is new from 11/29/2013. This may represent mild congestive heart failure. No airspace consolidation. No large effusion. There is mild cardiomegaly. IMPRESSION: Mild cardiomegaly with vascular and interstitial prominence, likely mild congestive heart failure. Electronically Signed   By: Andreas Newport M.D.   On: 05/01/2016 05:08    EKG: Independently reviewed.  Assessment/Plan Active Problems:   Chest pain   HTN (hypertension)   Hyperlipidemia   GERD (gastroesophageal reflux disease)   Hyperglycemia   Anemia   Elevated troponin   Anxiety   Depression   Acute respiratory distress due to Asthma exacerbation: Better controlled with nebs and O2. She is now on RA . Chest x-ray shows interstitial edema Lactic acid is elevated at 3.18. WBC 20.3 with L shift.  Afebrile. Urine and Blood  Cultures pending. Unlikely sepsis but will rule out.  - Admit to tele obs - Duonebs Q4  And Albuterol q 2 hrs prn  Await for cultures Antibiotic coverage with Aztreonam IV 2 g q 8h as per Pharmacy  Discussion and recommendation    Trend Lactic acid.   Elevated troponin  Associated w/ CP.   Troponin 0.67_>0.81 EGK SR without significant changes since last. QTC 474. BNP 364  No prior echo. LAst exercise stress test  Was negative, EF 65. No apparent history of CAD or CHF  Trend Troponin - EKG in AM - CTA chest is pending  (on ASA,  w/ acute onset of  CP)   2 D echo pending  - Cardiology consult pending    Hyperglycemia: no h/o DM. ancitipate continued elevation due to  Recent steroids - CBG Q6 - A1c -  May need SSI if  Glucose continues to be elevated   Hypertension BP 150/74 mmHg  Pulse 113  Controlled Continue home anti-hypertensive medications  Has received Lasix 40 injection at the ED and to continue 40 bid    GERD, no acute symptoms: Continue Prilosec  Peripheral neuropathy Continue Neurontin   Anemia  Hemoglobin  on admission 10.9. BL around same  Denies any bleeding issues  Last screening colonoscopy 2013, benign.  Continue to monitor Repeat CBC in am   Anxiety/Depression Continue Xanax and Prozac     DVT prophylaxis: Heparin per Pharmacy as recommended by Cards  Code Status:   Full   Family Communication:  Discussed with husband  Disposition Plan: Expect patient to be discharged to home after condition improves Consults called:    Case management as patient wishes to use her home meds only, due to cost.  Admission status:Tele  Obs     Sharene Butters E, PA-C [facility]   If 7PM-7AM, please contact night-coverage [email or web address] Password [facility]  05/01/2016, 8:13 AM

## 2016-05-01 NOTE — Consult Note (Signed)
Cardiology Consult    Patient ID: Donna Williamson MRN: [id number], DOB/AGE: September 30, 1946   Admit date: 05/01/2016 Date of Consult: 05/01/2016  Primary Physician: Jerlyn Ly, MD Primary Cardiologist: Liane Comber, MD (seen in consultation 10/2013) Requesting Provider: Georges Mouse, MD  Patient Profile    70 y/o ? with a h/o HTN, Asthma, and obesity, who presented to the ED this AM with a 1 month h/o progressive DOE, wt gain, and edema with wheezing and exertional chest heaviness over the last four days.  Past Medical History   Past Medical History  Diagnosis Date  . Hypertension   . GERD (gastroesophageal reflux disease)   . Anxiety   . Asthma   . Allergy   . Arthritis   . Chest pain     a. 10/2013 Exercise Myoview: Ef 65%, no ischemia.    Past Surgical History  Procedure Laterality Date  . Abdominal hysterectomy    . Hip orif w/ capsulotomy  Left  . Joint replacement       Allergies  Allergies  Allergen Reactions  . Levaquin [Levofloxacin] Anaphylaxis  . Codeine Nausea Only  . Macrobid [Nitrofurantoin]   . Ciprofloxacin Rash  . Penicillins Rash  . Sulfa Antibiotics Rash    History of Present Illness    70 y/o ? with a h/o HTN, obesity, GERD, allergies, and asthma.  She does have a prior h/o chest pain, and was admitted for such in 10/2013, ruling out, and subsequently undergoing exercise stress testing, which was normal.  She has done reasonably well since then and was in her usual state of health until about one month ago, when she began to notice acute worsening of baseline chronic DOE and also lower extremity edema.  She took prn lasix without effect and was seen in urgent care on 6/3.  BNP was normal @ 35.9.  Unfortunately, DOE continued to progress, as did lower extremity edema, wt gain (about 10-15 lbs), and increasing abdominal girth.  Beginning on 6/29, she began to experience significant wheezing as well as exertional chest heaviness, which would last about 5-10 mins  and resolve with rest.  She says that despite her h/o asthma and chronic DOE, she had never had exertional chest heaviness before.  Symptoms progressed over the past four days and on 7/1, she had severe wheezing throughout the day that was recalcitrant to inhalers.  Due to significant restlessness, dyspnea, and chest heaviness last night, she presented to the ED early this AM.  Here, BNP was elevated @ 364 while troponin was 0.67 (0.81 - poc).  WBC 20.3, lactate 3.18.  ECG non-acute, though she became tachycardic (sinus) with ambulation to the bathroom.  CXR notable for mild chf.  CTA chest negative for PE but notable for bilat pleural effusions and RUL patchy infiltrate.  She has been treated with a dose of IV lasix, and has been ordered ABX, IV steroids, and nebs.  She remains dyspneic and wheezing and reports mild chest heaviness currently.  Inpatient Medications    . ALPRAZolam  0.5 mg Oral Daily  . aspirin  81 mg Oral Daily  . atorvastatin  80 mg Oral q1800  . aztreonam  2 g Intravenous Q8H  . diltiazem  240 mg Oral Daily  . estradiol  1 mg Oral Daily  . FLUoxetine  10 mg Oral Daily  . furosemide  40 mg Intravenous BID  . gabapentin  100 mg Oral Daily  . irbesartan  300 mg Oral Daily  .  montelukast  10 mg Oral QHS  . pantoprazole  80 mg Oral Daily  . sodium chloride flush  3 mL Intravenous Q12H    Family History    Family History  Problem Relation Age of Onset  . Hypertension Mother   . Stroke Mother   . Heart disease Father   . Ovarian cancer Sister     Social History    Social History   Social History  . Marital Status: Married    Spouse Name: N/A  . Number of Children: N/A  . Years of Education: N/A   Occupational History  . Not on file.   Social History Main Topics  . Smoking status: Never Smoker   . Smokeless tobacco: Never Used  . Alcohol Use: No  . Drug Use: No  . Sexual Activity: Not on file   Other Topics Concern  . Not on file   Social History  Narrative   Lives in [place] with husband.  Active but does not routinely exercise.     Review of Systems    General:  No chills, fever, night sweats.  +++ 10-15 lbs weight gain.  Cardiovascular:  +++ exertional chest pain and worsening dyspnea on exertion, LE edema, increasing abd girth, and orthopnea.  No palpitations, paroxysmal nocturnal dyspnea. Dermatological: No rash, lesions/masses Respiratory: +++ cough and dyspnea Urologic: No hematuria, dysuria Abdominal:   No nausea, vomiting, diarrhea, bright red blood per rectum, melena, or hematemesis Neurologic:  No visual changes, wkns, changes in mental status. All other systems reviewed and are otherwise negative except as noted above.  Physical Exam    Blood pressure 144/71, pulse 116, temperature 97.2 F (36.2 C), temperature source Rectal, resp. rate 22, weight 243 lb 13.3 oz (110.6 kg), SpO2 98 %. O2 sat dropped to 81% on [place] while talking - NRB placed. General: Pleasant, moderate respiratory distress with speech.   Psych: Normal affect. Neuro: Alert and oriented X 3. Moves all extremities spontaneously. HEENT: Normal  Neck: Supple, obese, no bruits.  Difficult to gauge JVP 2/2 girth. Lungs:  Tachypneic.  Increased WOB.  Scattered rhonchi with diffuse insp/exp wheezing - audible w/o stethoscope. Heart: RRR, tachy, distant, no s3, s4, or murmurs. Abdomen: Soft, obese, non-tender, non-distended, BS + x 4.  Extremities: No clubbing, cyanosis.  1+ bilat LE edema to knees. DP/PT/Radials 2+ and equal bilaterally.  Labs    Troponin [facility] Test)  Recent Labs  05/01/16 0555  TROPIPOC 0.81*    Recent Labs  05/01/16 0531  TROPONINI 0.67*   Lab Results  Component Value Date   WBC 20.3* 05/01/2016   HGB 10.3* 05/01/2016   HCT 32.6* 05/01/2016   MCV 91.8 05/01/2016   PLT 425* 05/01/2016     Recent Labs Lab 05/01/16 0531  NA 137  K 4.1  CL 107  CO2 20*  BUN 21*  CREATININE 1.09*  CALCIUM 9.3  GLUCOSE 174*      Radiology Studies    Ct Angio Chest Pe W/cm &/or Wo Cm  05/01/2016  CLINICAL DATA:  Asthma attack with difficulty breathing, initial encounter EXAM: CT ANGIOGRAPHY CHEST WITH CONTRAST TECHNIQUE: Multidetector CT imaging of the chest was performed using the standard protocol during bolus administration of intravenous contrast. Multiplanar CT image reconstructions and MIPs were obtained to evaluate the vascular anatomy. CONTRAST:  100 mL Isovue 370. COMPARISON:  Plain film from earlier in the same day. FINDINGS: Mediastinum/Lymph Nodes: No pulmonary emboli or thoracic aortic dissection identified. No masses or  pathologically enlarged lymph nodes identified. Coronary calcifications are noted. Stable mild pericardial fluid is noted. Lungs/Pleura: Bilateral pleural effusions right greater than left are noted. Patchy infiltrative changes are noted in the right upper lobe. No parenchymal nodule is noted. Upper abdomen: No acute findings. Musculoskeletal: No chest wall mass or suspicious bone lesions identified. Review of the MIP images confirms the above findings. IMPRESSION: Bilateral pleural effusions. Patchy infiltrative changes are seen in the right upper lobe Electronically Signed   By: Inez Catalina M.D.   On: 05/01/2016 08:03   Dg Chest Port 1 View  05/01/2016  CLINICAL DATA:  Dyspnea today EXAM: PORTABLE CHEST 1 VIEW COMPARISON:  11/29/2013 FINDINGS: There is mild vascular and interstitial prominence which is new from 11/29/2013. This may represent mild congestive heart failure. No airspace consolidation. No large effusion. There is mild cardiomegaly. IMPRESSION: Mild cardiomegaly with vascular and interstitial prominence, likely mild congestive heart failure. Electronically Signed   By: Andreas Newport M.D.   On: 05/01/2016 05:08    ECG & Cardiac Imaging    RSR, 83, no acute st/t changes.  Assessment & Plan    1.  Acute hypoxic respiratory failure:  Likely multifactorial in the setting of a one  month h/o progressive DOE, edema, and wt gain, with worsening dyspnea, wheezing, and exertional chest heaviness since 6/29.  CXR suggest mild edema, while CTA also shows bilat pleural effusions and RUL infiltrate.  WBC 20.3 (has recently had outpt steroids), lactate 3.18.  Medicine admitting and abx, nebs, lasix, and steroids ordered.    2.  Acute CHF:  As above, 1 month h/o DOE, wt gain (10-15 lbs), lower ext edema, and increasing abd girth.  BNP mildly elevated while cxr shows mild failure.  Previously nl EF by SPECT in 10/2013.  Echo ordered and pending.  She has received a dose of IV lasix in the ED and I will order lasix 40 IV bid for the time being, as she does have evidence of volume overload on exam and likely has little pulmonary reserve given asthma flare.  3.  Acute exacerbation of asthma:  Wheezing since Thursday 6/29.  Nebs, steroids per IM.  4.  NSTEMI:  In the setting of above, pt has been having exertional chest heaviness since 6/29.  Troponin is elevated @ 0.67.  Previous normal exercise myoview in 10/2013.  Ongoing risk factors include HTN and obesity.  Follow troponin trend and check echo to eval EF and assess for WMA.  Pending both, she may require diagnostic cath vs repeat stress testing, once respiratory status stabilized.  Add ASA, heparin, high potency statin, ntg paste for now.  No  blocker in setting of asthma/wheezing.  5.  Essential HTN:  BP elevated in setting of acute illness.  Resume home ARB and dilt.  Follow with diuresis.  6.  Lipids:  Unknown.  Check fasting lipids in am.  Adding high potency statin in setting of ACS.  Signed, Murray Hodgkins, NP 05/01/2016, 8:33 AM

## 2016-05-01 NOTE — Progress Notes (Signed)
[place] for Heparin Indication: chest pain/ACS  Allergies  Allergen Reactions  . Levaquin [Levofloxacin] Anaphylaxis  . Codeine Nausea Only  . Macrobid [Nitrofurantoin]   . Ciprofloxacin Rash  . Penicillins Rash  . Sulfa Antibiotics Rash    Patient Measurements: Weight: 243 lb 13.3 oz (110.6 kg)  IBW: 60 kg Heparin Dosing Weight: 85.5 kg  Vital Signs: Temp: 97.9 F (36.6 C) (07/02 1515) Temp Source: Oral (07/02 1515) BP: 137/69 mmHg (07/02 1515) Pulse Rate: 106 (07/02 1515)  Labs:  Recent Labs  05/01/16 0531 05/01/16 1258 05/01/16 1701  HGB 10.3*  --   --   HCT 32.6*  --   --   PLT 425*  --   --   HEPARINUNFRC  --   --  0.28*  CREATININE 1.09*  --   --   TROPONINI 0.67* 0.96*  --     Estimated Creatinine Clearance: 59.3 mL/min (by C-G formula based on Cr of 1.09).   Assessment: 82 YOF who [facility] with asthma exacerbation x 2 days with SOB and wheezing. The patient was also noted to have chest tightness with positive troponins so pharmacy has been consulted to start Heparin for r/o ACS.  Baseline CBC: Hgb 10.3, plts 425. First heparin level just below goal at 0.28 units/mL. No bleeding noted.  Goal of Therapy:  Heparin level 0.3-0.7 units/ml Monitor platelets by anticoagulation protocol: Yes   Plan:  -increase heparin infusion to 1250 units/hr (12.75mL/hr) -next level with AM labs -daily HL and CBC  Garmon Dehn D. Carliyah Cotterman, PharmD, BCPS Clinical Pharmacist Pager: [phone] 05/01/2016 5:46 PM

## 2016-05-01 NOTE — ED Notes (Signed)
Pt. reports asthma attack onset last  week with wheezing worse last night with occasional dry cough , denies fever or chills.

## 2016-05-01 NOTE — Progress Notes (Signed)
Spoke with [place] who asked me to notify cardiology because they are following her.  Cardiologist notified of critical lab of troponin 7.03, EKG performed, and pt asymptomatic.  Will await orders.

## 2016-05-01 NOTE — Progress Notes (Signed)
  Echocardiogram 2D Echocardiogram with Definity has been performed.  Tresa Res 05/01/2016, 12:46 PM

## 2016-05-02 ENCOUNTER — Inpatient Hospital Stay (HOSPITAL_COMMUNITY): Payer: Medicare Other

## 2016-05-02 ENCOUNTER — Encounter (HOSPITAL_COMMUNITY)
Admission: EM | Disposition: A | Discharge status: Home / Self Care | Payer: Self-pay | Source: Home / Self Care | Attending: Internal Medicine

## 2016-05-02 ENCOUNTER — Encounter (HOSPITAL_COMMUNITY): Payer: Self-pay | Admitting: General Practice

## 2016-05-02 DIAGNOSIS — D649 Anemia, unspecified: Secondary | ICD-10-CM

## 2016-05-02 DIAGNOSIS — I5031 Acute diastolic (congestive) heart failure: Secondary | ICD-10-CM

## 2016-05-02 DIAGNOSIS — I5032 Chronic diastolic (congestive) heart failure: Secondary | ICD-10-CM | POA: Insufficient documentation

## 2016-05-02 DIAGNOSIS — I2510 Atherosclerotic heart disease of native coronary artery without angina pectoris: Secondary | ICD-10-CM

## 2016-05-02 DIAGNOSIS — I214 Non-ST elevation (NSTEMI) myocardial infarction: Secondary | ICD-10-CM

## 2016-05-02 HISTORY — PX: CARDIAC CATHETERIZATION: SHX172

## 2016-05-02 HISTORY — PX: CORONARY ANGIOPLASTY WITH STENT PLACEMENT: SHX49

## 2016-05-02 LAB — COMPREHENSIVE METABOLIC PANEL
ALBUMIN: 3.5 g/dL (ref 3.5–5.0)
ALK PHOS: 69 U/L (ref 38–126)
ALT: 33 U/L (ref 14–54)
AST: 61 U/L — AB (ref 15–41)
Anion gap: 11 (ref 5–15)
BILIRUBIN TOTAL: 0.4 mg/dL (ref 0.3–1.2)
BUN: 22 mg/dL — AB (ref 6–20)
CO2: 21 mmol/L — ABNORMAL LOW (ref 22–32)
CREATININE: 1.14 mg/dL — AB (ref 0.44–1.00)
Calcium: 9.1 mg/dL (ref 8.9–10.3)
Chloride: 106 mmol/L (ref 101–111)
GFR calc Af Amer: 54 mL/min — ABNORMAL LOW (ref >60)
GFR calc non Af Amer: 47 mL/min — ABNORMAL LOW (ref >60)
GLUCOSE: 198 mg/dL — AB (ref 65–99)
POTASSIUM: 4.2 mmol/L (ref 3.5–5.1)
Sodium: 138 mmol/L (ref 135–145)
TOTAL PROTEIN: 6.4 g/dL — AB (ref 6.5–8.1)

## 2016-05-02 LAB — PROTIME-INR
INR: 1.16 (ref 0.00–1.49)
Prothrombin Time: 15 seconds (ref 11.6–15.2)

## 2016-05-02 LAB — URINE CULTURE

## 2016-05-02 LAB — POCT I-STAT 3, VENOUS BLOOD GAS (G3P V)
BICARBONATE: 24.7 meq/L — AB (ref 20.0–24.0)
O2 Saturation: 66 %
PCO2 VEN: 39.7 mmHg — AB (ref 45.0–50.0)
PO2 VEN: 34 mmHg (ref 31.0–45.0)
TCO2: 26 mmol/L (ref 0–100)
pH, Ven: 7.402 — ABNORMAL HIGH (ref 7.250–7.300)

## 2016-05-02 LAB — POCT I-STAT 3, ART BLOOD GAS (G3+)
Acid-base deficit: 1 mmol/L (ref 0.0–2.0)
BICARBONATE: 22.8 meq/L (ref 20.0–24.0)
O2 Saturation: 92 %
PH ART: 7.414 (ref 7.350–7.450)
PO2 ART: 64 mmHg — AB (ref 80.0–100.0)
TCO2: 24 mmol/L (ref 0–100)
pCO2 arterial: 35.6 mmHg (ref 35.0–45.0)

## 2016-05-02 LAB — HEPARIN LEVEL (UNFRACTIONATED)
Heparin Unfractionated: 0.36 IU/mL (ref 0.30–0.70)
Heparin Unfractionated: 0.38 IU/mL (ref 0.30–0.70)

## 2016-05-02 LAB — HEMOGLOBIN A1C
HEMOGLOBIN A1C: 5.8 % — AB (ref 4.8–5.6)
Mean Plasma Glucose: 120 mg/dL

## 2016-05-02 LAB — CBC
HEMATOCRIT: 31 % — AB (ref 36.0–46.0)
Hemoglobin: 9.5 g/dL — ABNORMAL LOW (ref 12.0–15.0)
MCH: 28 pg (ref 26.0–34.0)
MCHC: 30.6 g/dL (ref 30.0–36.0)
MCV: 91.4 fL (ref 78.0–100.0)
PLATELETS: 382 10*3/uL (ref 150–400)
RBC: 3.39 MIL/uL — ABNORMAL LOW (ref 3.87–5.11)
RDW: 13.7 % (ref 11.5–15.5)
WBC: 15.5 10*3/uL — ABNORMAL HIGH (ref 4.0–10.5)

## 2016-05-02 LAB — LIPID PANEL
CHOL/HDL RATIO: 2.4 ratio
CHOLESTEROL: 186 mg/dL (ref 0–200)
HDL: 79 mg/dL (ref >40)
LDL Cholesterol: 84 mg/dL (ref 0–99)
Triglycerides: 116 mg/dL (ref <150)
VLDL: 23 mg/dL (ref 0–40)

## 2016-05-02 LAB — GLUCOSE, CAPILLARY
GLUCOSE-CAPILLARY: 161 mg/dL — AB (ref 65–99)
GLUCOSE-CAPILLARY: 163 mg/dL — AB (ref 65–99)
GLUCOSE-CAPILLARY: 173 mg/dL — AB (ref 65–99)
Glucose-Capillary: 187 mg/dL — ABNORMAL HIGH (ref 65–99)
Glucose-Capillary: 162 mg/dL — ABNORMAL HIGH (ref 65–99)

## 2016-05-02 LAB — POCT ACTIVATED CLOTTING TIME: ACTIVATED CLOTTING TIME: 373 s

## 2016-05-02 LAB — TROPONIN I: Troponin I: 12.09 ng/mL (ref <0.03)

## 2016-05-02 LAB — LACTIC ACID, PLASMA: LACTIC ACID, VENOUS: 1.4 mmol/L (ref 0.5–1.9)

## 2016-05-02 SURGERY — RIGHT/LEFT HEART CATH AND CORONARY ANGIOGRAPHY
Anesthesia: LOCAL

## 2016-05-02 MED ORDER — ASPIRIN EC 81 MG PO TBEC
81.0000 mg | DELAYED_RELEASE_TABLET | Freq: Every day | ORAL | Status: DC
  Administered 2016-05-03 – 2016-05-04 (×2): 81 mg via ORAL
  Filled 2016-05-02 (×2): qty 1

## 2016-05-02 MED ORDER — HEPARIN (PORCINE) IN NACL 2-0.9 UNIT/ML-% IJ SOLN
INTRAMUSCULAR | Status: AC
  Filled 2016-05-02: qty 1000

## 2016-05-02 MED ORDER — HEPARIN SODIUM (PORCINE) 1000 UNIT/ML IJ SOLN
INTRAMUSCULAR | Status: AC
  Filled 2016-05-02: qty 1

## 2016-05-02 MED ORDER — VERAPAMIL HCL 2.5 MG/ML IV SOLN
INTRAVENOUS | Status: DC | PRN
  Administered 2016-05-02: 10 mL via INTRA_ARTERIAL

## 2016-05-02 MED ORDER — SODIUM CHLORIDE 0.9 % IV SOLN
250.0000 mg | INTRAVENOUS | Status: DC | PRN
  Administered 2016-05-02: 1.75 mg/kg/h via INTRAVENOUS

## 2016-05-02 MED ORDER — MIDAZOLAM HCL 2 MG/2ML IJ SOLN
INTRAMUSCULAR | Status: AC
  Filled 2016-05-02: qty 2

## 2016-05-02 MED ORDER — FENTANYL CITRATE (PF) 100 MCG/2ML IJ SOLN
INTRAMUSCULAR | Status: DC | PRN
  Administered 2016-05-02 (×2): 25 ug via INTRAVENOUS

## 2016-05-02 MED ORDER — LEVALBUTEROL HCL 0.63 MG/3ML IN NEBU
0.6300 mg | INHALATION_SOLUTION | Freq: Three times a day (TID) | RESPIRATORY_TRACT | Status: DC
  Administered 2016-05-02 – 2016-05-03 (×2): 0.63 mg via RESPIRATORY_TRACT
  Filled 2016-05-02 (×2): qty 3

## 2016-05-02 MED ORDER — INSULIN ASPART 100 UNIT/ML ~~LOC~~ SOLN
0.0000 [IU] | Freq: Four times a day (QID) | SUBCUTANEOUS | Status: DC
  Administered 2016-05-02: 19:00:00 2 [IU] via SUBCUTANEOUS
  Administered 2016-05-03: 1 [IU] via SUBCUTANEOUS
  Administered 2016-05-03: 3 [IU] via SUBCUTANEOUS
  Administered 2016-05-03 (×2): 2 [IU] via SUBCUTANEOUS
  Administered 2016-05-04 (×3): 1 [IU] via SUBCUTANEOUS

## 2016-05-02 MED ORDER — FENTANYL CITRATE (PF) 100 MCG/2ML IJ SOLN
INTRAMUSCULAR | Status: AC
  Filled 2016-05-02: qty 2

## 2016-05-02 MED ORDER — IOPAMIDOL (ISOVUE-370) INJECTION 76%
INTRAVENOUS | Status: AC
  Filled 2016-05-02: qty 100

## 2016-05-02 MED ORDER — METOPROLOL TARTRATE 5 MG/5ML IV SOLN
INTRAVENOUS | Status: AC
  Filled 2016-05-02: qty 5

## 2016-05-02 MED ORDER — SODIUM CHLORIDE 0.9% FLUSH: 3.0000 mL | INTRAVENOUS | Status: DC | PRN

## 2016-05-02 MED ORDER — LIDOCAINE HCL (PF) 1 % IJ SOLN
INTRAMUSCULAR | Status: DC | PRN
  Administered 2016-05-02: 5 mL

## 2016-05-02 MED ORDER — VITAMIN B-12 1000 MCG PO TABS
500.0000 ug | ORAL_TABLET | Freq: Every day | ORAL | Status: DC
  Administered 2016-05-03 – 2016-05-04 (×2): 500 ug via ORAL
  Filled 2016-05-02 (×2): qty 1

## 2016-05-02 MED ORDER — ACYCLOVIR 200 MG PO CAPS: 400.0000 mg | ORAL_CAPSULE | Freq: Three times a day (TID) | ORAL | Status: DC

## 2016-05-02 MED ORDER — SODIUM CHLORIDE 0.9% FLUSH: 3.0000 mL | Freq: Two times a day (BID) | INTRAVENOUS | Status: DC

## 2016-05-02 MED ORDER — IOPAMIDOL (ISOVUE-370) INJECTION 76%
INTRAVENOUS | Status: DC | PRN
  Administered 2016-05-02: 130 mL via INTRA_ARTERIAL

## 2016-05-02 MED ORDER — HEPARIN SODIUM (PORCINE) 1000 UNIT/ML IJ SOLN
INTRAMUSCULAR | Status: DC | PRN
  Administered 2016-05-02: 5000 [IU] via INTRAVENOUS

## 2016-05-02 MED ORDER — ENOXAPARIN SODIUM 40 MG/0.4ML ~~LOC~~ SOLN
40.0000 mg | SUBCUTANEOUS | Status: DC
  Administered 2016-05-03 – 2016-05-04 (×2): 40 mg via SUBCUTANEOUS
  Filled 2016-05-02 (×2): qty 0.4

## 2016-05-02 MED ORDER — MIDAZOLAM HCL 2 MG/2ML IJ SOLN
INTRAMUSCULAR | Status: DC | PRN
Start: 2016-05-02 — End: 2016-05-02
  Administered 2016-05-02 (×2): 1 mg via INTRAVENOUS

## 2016-05-02 MED ORDER — METHYLPREDNISOLONE SODIUM SUCC 125 MG IJ SOLR
60.0000 mg | Freq: Two times a day (BID) | INTRAMUSCULAR | Status: DC
Start: 2016-05-02 — End: 2016-05-03
  Administered 2016-05-02 – 2016-05-03 (×2): 60 mg via INTRAVENOUS
  Filled 2016-05-02 (×2): qty 2

## 2016-05-02 MED ORDER — NITROGLYCERIN 1 MG/10 ML FOR IR/CATH LAB
INTRA_ARTERIAL | Status: AC
  Filled 2016-05-02: qty 10

## 2016-05-02 MED ORDER — NITROGLYCERIN 2 % TD OINT
0.5000 [in_us] | TOPICAL_OINTMENT | Freq: Four times a day (QID) | TRANSDERMAL | Status: DC
  Filled 2016-05-02: qty 30

## 2016-05-02 MED ORDER — ACETAMINOPHEN 325 MG PO TABS: 650.0000 mg | ORAL_TABLET | ORAL | Status: DC | PRN

## 2016-05-02 MED ORDER — HEPARIN (PORCINE) IN NACL 2-0.9 UNIT/ML-% IJ SOLN
INTRAMUSCULAR | Status: DC | PRN
  Administered 2016-05-02: 1500 mL

## 2016-05-02 MED ORDER — SODIUM CHLORIDE 0.9 % IV SOLN
INTRAVENOUS | Status: DC | PRN
  Administered 2016-05-02: 120 mL via INTRAVENOUS

## 2016-05-02 MED ORDER — SODIUM CHLORIDE 0.9 % IV SOLN: 250.0000 mL | INTRAVENOUS | Status: DC | PRN

## 2016-05-02 MED ORDER — TICAGRELOR 90 MG PO TABS
ORAL_TABLET | ORAL | Status: AC
  Filled 2016-05-02: qty 1

## 2016-05-02 MED ORDER — NITROGLYCERIN 2 % TD OINT
1.0000 [in_us] | TOPICAL_OINTMENT | Freq: Four times a day (QID) | TRANSDERMAL | Status: DC
  Administered 2016-05-02 – 2016-05-03 (×3): 1 [in_us] via TOPICAL
  Filled 2016-05-02: qty 30

## 2016-05-02 MED ORDER — BIVALIRUDIN BOLUS VIA INFUSION - CUPID
INTRAVENOUS | Status: DC | PRN
  Administered 2016-05-02: 82.95 mg via INTRAVENOUS

## 2016-05-02 MED ORDER — ADULT MULTIVITAMIN W/MINERALS CH: 1.0000 | ORAL_TABLET | Freq: Every day | ORAL | Status: DC

## 2016-05-02 MED ORDER — TICAGRELOR 90 MG PO TABS
ORAL_TABLET | ORAL | Status: DC | PRN
  Administered 2016-05-02: 180 mg via ORAL

## 2016-05-02 MED ORDER — ROSUVASTATIN CALCIUM 10 MG PO TABS
20.0000 mg | ORAL_TABLET | Freq: Every day | ORAL | Status: DC
  Administered 2016-05-02 – 2016-05-03 (×2): 20 mg via ORAL
  Filled 2016-05-02: qty 1
  Filled 2016-05-02: qty 2

## 2016-05-02 MED ORDER — SODIUM CHLORIDE 0.9% FLUSH
3.0000 mL | Freq: Two times a day (BID) | INTRAVENOUS | Status: DC
Start: 2016-05-02 — End: 2016-05-04
  Administered 2016-05-03 (×2): 3 mL via INTRAVENOUS

## 2016-05-02 MED ORDER — METOPROLOL TARTRATE 5 MG/5ML IV SOLN
INTRAVENOUS | Status: DC | PRN
  Administered 2016-05-02: 5 mg via INTRAVENOUS

## 2016-05-02 MED ORDER — NITROGLYCERIN 1 MG/10 ML FOR IR/CATH LAB
INTRA_ARTERIAL | Status: DC | PRN
  Administered 2016-05-02: 200 ug via INTRACORONARY

## 2016-05-02 MED ORDER — VERAPAMIL HCL 2.5 MG/ML IV SOLN
INTRAVENOUS | Status: AC
  Filled 2016-05-02: qty 2

## 2016-05-02 MED ORDER — BIVALIRUDIN 250 MG IV SOLR
INTRAVENOUS | Status: AC
  Filled 2016-05-02: qty 250

## 2016-05-02 MED ORDER — LIDOCAINE HCL (PF) 1 % IJ SOLN
INTRAMUSCULAR | Status: AC
  Filled 2016-05-02: qty 30

## 2016-05-02 MED ORDER — ONDANSETRON HCL 4 MG/2ML IJ SOLN: 4.0000 mg | Freq: Four times a day (QID) | INTRAMUSCULAR | Status: DC | PRN

## 2016-05-02 MED ORDER — SODIUM CHLORIDE 0.9 % IV SOLN: INTRAVENOUS | Status: DC

## 2016-05-02 MED ORDER — ASPIRIN 81 MG PO CHEW: 81.0000 mg | CHEWABLE_TABLET | Freq: Every day | ORAL | Status: DC

## 2016-05-02 MED ORDER — TICAGRELOR 90 MG PO TABS
90.0000 mg | ORAL_TABLET | Freq: Two times a day (BID) | ORAL | Status: DC
  Administered 2016-05-03 – 2016-05-04 (×3): 90 mg via ORAL
  Filled 2016-05-02 (×4): qty 1

## 2016-05-02 MED ORDER — SODIUM CHLORIDE 0.9 % IV SOLN
250.0000 mL | INTRAVENOUS | Status: DC | PRN
Start: 2016-05-02 — End: 2016-05-02

## 2016-05-02 SURGICAL SUPPLY — 20 items
BALLN EMERGE MR 2.5X15 (BALLOONS) ×2
BALLN ~~LOC~~ EMERGE MR 2.75X12 (BALLOONS) ×2
BALLOON EMERGE MR 2.5X15 (BALLOONS) ×1 IMPLANT
BALLOON ~~LOC~~ EMERGE MR 2.75X12 (BALLOONS) ×1 IMPLANT
CATH BALLN WEDGE 5F 110CM (CATHETERS) ×2 IMPLANT
CATH INFINITI 5 FR JL3.5 (CATHETERS) ×2 IMPLANT
CATH INFINITI JR4 5F (CATHETERS) ×2 IMPLANT
DEVICE RAD COMP TR BAND LRG (VASCULAR PRODUCTS) ×2 IMPLANT
GLIDESHEATH SLEND SS 6F .021 (SHEATH) ×2 IMPLANT
GUIDE CATH RUNWAY 6FR CLS3 (CATHETERS) ×2 IMPLANT
KIT ENCORE 26 ADVANTAGE (KITS) ×2 IMPLANT
KIT HEART LEFT (KITS) ×2 IMPLANT
PACK CARDIAC CATHETERIZATION (CUSTOM PROCEDURE TRAY) ×2 IMPLANT
SHEATH FAST CATH BRACH 5F 5CM (SHEATH) ×2 IMPLANT
STENT SYNERGY DES 2.5X16 (Permanent Stent) ×2 IMPLANT
TRANSDUCER W/STOPCOCK (MISCELLANEOUS) ×2 IMPLANT
TUBING CIL FLEX 10 FLL-RA (TUBING) ×2 IMPLANT
VALVE GUARDIAN II ~~LOC~~ HEMO (MISCELLANEOUS) ×2 IMPLANT
WIRE ASAHI PROWATER 180CM (WIRE) ×2 IMPLANT
WIRE SAFE-T 1.5MM-J .035X260CM (WIRE) ×2 IMPLANT

## 2016-05-02 NOTE — H&P (View-Only) (Signed)
Hospital Problem List     Active Problems:   Chest pain   HTN (hypertension)   Hyperlipidemia   GERD (gastroesophageal reflux disease)   Hyperglycemia   Anemia   Anxiety   Depression   Asthma exacerbation   NSTEMI (non-ST elevated myocardial infarction) [facility])    Patient Profile:   Primary Cardiologist: Dr. Meda Coffee  70 y/o female w/ PMH of  HTN, Asthma, and obesity who presented to [facility] ED on 05/01/2016 with a 1 month h/o progressive DOE, wt gain, and edema with wheezing and exertional chest heaviness over the last four days.  Subjective   Respiratory status has improved. Denies any repeat episodes of chest pain since being admitted.  Inpatient Medications    . ALPRAZolam  0.5 mg Oral Daily  . aspirin EC  325 mg Oral Daily  . atorvastatin  80 mg Oral q1800  . diltiazem  240 mg Oral Daily  . doxycycline (VIBRAMYCIN) IV  100 mg Intravenous Q12H  . estradiol  1 mg Oral Daily  . FLUoxetine  10 mg Oral Daily  . furosemide  40 mg Intravenous BID  . gabapentin  100 mg Oral QHS  . levalbuterol  0.63 mg Nebulization Q6H  . methylPREDNISolone (SOLU-MEDROL) injection  80 mg Intravenous Q8H  . montelukast  10 mg Oral QHS  . olmesartan  40 mg Oral Daily  . pantoprazole  80 mg Oral Daily  . sodium chloride flush  3 mL Intravenous Q12H    Vital Signs    Filed Vitals:   05/01/16 2059 05/02/16 0216 05/02/16 0425 05/02/16 0837  BP: 139/67  138/63   Pulse: 107  95   Temp: 98.6 F (37 C)  98.1 F (36.7 C)   TempSrc: Oral  Oral   Resp: 21  22   Weight:      SpO2: 96% 95% 98% 98%    Intake/Output Summary (Last 24 hours) at 05/02/16 0929 Last data filed at 05/02/16 0425  Gross per 24 hour  Intake    240 ml  Output   3001 ml  Net  -2761 ml   Filed Weights   05/01/16 0726  Weight: 243 lb 13.3 oz (110.6 kg)    Physical Exam    General: Well developed, well nourished, female appearing in no acute distress. Head: Normocephalic, atraumatic.  Neck: Supple without bruits,  JVD at 9 cm. Lungs:  Resp regular and unlabored, expiratory wheezing in upper lung fields, mild rales at bases bilaterally. Heart: RRR, S1, S2, no S3, S4, or murmur; no rub. Abdomen: Soft, non-tender, non-distended with normoactive bowel sounds. No hepatomegaly. No rebound/guarding. No obvious abdominal masses. Extremities: No clubbing or cyanosis, 1+ edema bilaterally. Distal pedal pulses are 2+ bilaterally. Neuro: Alert and oriented X 3. Moves all extremities spontaneously. Psych: Normal affect.  Labs    CBC  Recent Labs  05/01/16 0531 05/02/16 0113  WBC 20.3* 15.5*  NEUTROABS 17.4*  --   HGB 10.3* 9.5*  HCT 32.6* 31.0*  MCV 91.8 91.4  PLT 425* [id number]   Basic Metabolic Panel  Recent Labs  05/01/16 0531 05/01/16 0630 05/02/16 0113  NA 137  --  138  K 4.1  --  4.2  CL 107  --  106  CO2 20*  --  21*  GLUCOSE 174*  --  198*  BUN 21*  --  22*  CREATININE 1.09*  --  1.14*  CALCIUM 9.3  --  9.1  MG  --  1.9  --  Liver Function Tests  Recent Labs  05/02/16 0113  AST 61*  ALT 33  ALKPHOS 69  BILITOT 0.4  PROT 6.4*  ALBUMIN 3.5   No results for input(s): LIPASE, AMYLASE in the last 72 hours. Cardiac Enzymes  Recent Labs  05/01/16 1258 05/01/16 1918 05/02/16 0113  TROPONINI 0.96* 7.03* 12.09*     Telemetry    NSR, HR in 90's - 110's.   ECG    NSR, HR 97, with no acute ST or T-wave changes.   Cardiac Studies and Radiology    Dg Chest 2 View  05/02/2016  CLINICAL DATA:  Shortness of breath and chest pain EXAM: CHEST  2 VIEW COMPARISON:  05/01/2016 FINDINGS: Cardiac shadow is stable and mildly enlarged. Mild vascular congestion is again seen. No focal interstitial edema is noted. No focal confluent infiltrate is seen. Minimal bilateral pleural effusions are again noted. IMPRESSION: Mild vascular congestion. Electronically Signed   By: Inez Catalina M.D.   On: 05/02/2016 08:26   Ct Angio Chest Pe W/cm &/or Wo Cm  05/01/2016  CLINICAL DATA:  Asthma attack  with difficulty breathing, initial encounter EXAM: CT ANGIOGRAPHY CHEST WITH CONTRAST TECHNIQUE: Multidetector CT imaging of the chest was performed using the standard protocol during bolus administration of intravenous contrast. Multiplanar CT image reconstructions and MIPs were obtained to evaluate the vascular anatomy. CONTRAST:  100 mL Isovue 370. COMPARISON:  Plain film from earlier in the same day. FINDINGS: Mediastinum/Lymph Nodes: No pulmonary emboli or thoracic aortic dissection identified. No masses or pathologically enlarged lymph nodes identified. Coronary calcifications are noted. Stable mild pericardial fluid is noted. Lungs/Pleura: Bilateral pleural effusions right greater than left are noted. Patchy infiltrative changes are noted in the right upper lobe. No parenchymal nodule is noted. Upper abdomen: No acute findings. Musculoskeletal: No chest wall mass or suspicious bone lesions identified. Review of the MIP images confirms the above findings. IMPRESSION: Bilateral pleural effusions. Patchy infiltrative changes are seen in the right upper lobe Electronically Signed   By: Inez Catalina M.D.   On: 05/01/2016 08:03   Dg Chest Port 1 View  05/01/2016  CLINICAL DATA:  Dyspnea today EXAM: PORTABLE CHEST 1 VIEW COMPARISON:  11/29/2013 FINDINGS: There is mild vascular and interstitial prominence which is new from 11/29/2013. This may represent mild congestive heart failure. No airspace consolidation. No large effusion. There is mild cardiomegaly. IMPRESSION: Mild cardiomegaly with vascular and interstitial prominence, likely mild congestive heart failure. Electronically Signed   By: Andreas Newport M.D.   On: 05/01/2016 05:08    Echocardiogram: 05/01/2016 Study Conclusions - Left ventricle: The cavity size was normal. Wall thickness was  normal. Systolic function was normal. The estimated ejection  fraction was in the range of 55% to 60%. Wall motion was normal;  there were no regional wall  motion abnormalities. The study is  not technically sufficient to allow evaluation of LV diastolic  function. - Mitral valve: There was mild regurgitation.  Impressions:  - Definity used; Normal LV systolic function; mild MR.  Assessment & Plan    1. Acute hypoxic respiratory failure - Likely multifactorial in the setting of a one month h/o progressive DOE, edema, and wt gain, with worsening dyspnea, wheezing, and exertional chest heaviness since 6/29.  - CXR suggest mild edema, while CTA also shows bilat pleural effusions and RUL infiltrate. WBC 20.3 (has recently had outpt steroids), improved to 15.5 this AM. Lactate improved from 7.2 to 1.4. - per admitting team  2. NSTEMI -  In the setting of above, pt has been having exertional chest heaviness since [id number].  - cyclic troponin values have been 0.67, 0.96, 7.03, and 12.09.  Ongoing risk factors include HTN and obesity. - continue ASA, heparin, high potency statin, and ntg paste for now. No ?-blocker in setting of asthma/wheezing. - she will need a definitive cardiac catheterization once respiratory status improves. The patient wishes to have this today so she is not hospitalized throughout July 4th, but I do not believe her respiratory status is optimal for a cath. Hgb also at 9.5 (was 12.2 one month ago). The patient understands that risks included but are not limited to stroke (1 in 1000), death (1 in 86), kidney failure [usually temporary] (1 in 500), bleeding (1 in 200), allergic reaction [possibly serious] (1 in 200). MD to discuss with patient cath today vs. Wednesday.  3. Acute Diastolic CHF Exacerbation - As above, 1 month h/o DOE, wt gain (10-15 lbs), lower ext edema, and increasing abd girth. BNP mildly elevated while cxr shows mild failure.  - Previously nl EF by SPECT in 10/2013. Echo this admission shows EF of 55-60% with no wall motion abnormalities. - started on IV Lasix 40mg  IV BID with a net output of -2.7 L  thus far. Continue with IV diuresis.   4. Acute exacerbation of asthma - Wheezing since Thursday 6/29.  - Nebs, steroids per IM.  5. Essential HTN - BP improved to 137/63 - 151/69 in the past 24 hours. - continue current medication regimen.  6. Lipids - Unknown.Will check lipid panel in the AM.  - has been started on high-intensity therapy in the setting of ACS. Intolerant to Lipitor in the past, will try Crestor.  Arna Medici , PA-C 9:29 AM 05/02/2016 Pager: [phone] Patient seen and examined and history reviewed. Agree with above findings and plan. Very pleasant 70 yo WF admitted with acute respiratory failure and NSTEMI. Still notes some left precordial pain when walking to BR. Still has dyspnea on exertion but states she is OK lying flat. Prior Myoview in 2015 normal but limited by poor exercise tolerance. On exam she is obese. Mild basilar rales. NSR. CV without gallop or murmur. 2+ edema. Ecg is without acute change. Troponin elevation to 12.  BNP elevated.  WBC is improving. Lactic acidosis resolved. Creatinine 1.14.  I personally reviewed Echo. This shows overall good LV function. No significant valvular disease. I think the posterior wall is hypokinetic.   Impression: 1. NSTEMI. ? Related to LCx disease. Possible stress related but troponin elevation is higher than I would expect for this. On ASA, nitrates and IV heparin. On po diltiazem and statin. Would avoid beta blocker for now given history of asthma but may need to consider selective beta blocker depending on cardiac evaluation. I have recommended a [place] today to define anatomy and filling pressures. The procedure and risks were reviewed including but not limited to death, myocardial infarction, stroke, arrythmias, bleeding, transfusion, emergency surgery, dye allergy, or renal dysfunction. The patient voices understanding and is agreeable to proceed. I think she is stable to proceed with cardiac cath  later today. 2. Acute respiratory failure- clearly a component of acute on chronic diastolic CHF. Diuresing well. Will continue IV lasix. RHC today. 3. HTN controlled 4. Obesity 5. Asthma exacerbation. 6. Chronic anemia. Exacerbated by acute illness. Follow Hgb.    Peter Martinique, [place] 05/02/2016 9:52 AM

## 2016-05-02 NOTE — Interval H&P Note (Signed)
Cath Lab Visit (complete for each Cath Lab visit)  Clinical Evaluation Leading to the Procedure:   ACS: Yes.    Non-ACS:    Anginal Classification: CCS IV  Anti-ischemic medical therapy: Minimal Therapy (1 class of medications)  Non-Invasive Test Results: No non-invasive testing performed  Prior CABG: No previous CABG      History and Physical Interval Note:  05/02/2016 2:21 PM  Donna Williamson  has presented today for surgery, with the diagnosis of SOB  The various methods of treatment have been discussed with the patient and family. After consideration of risks, benefits and other options for treatment, the patient has consented to  Procedure(s): Right/Left Heart Cath and Coronary Angiography (N/A) as a surgical intervention .  The patient's history has been reviewed, patient examined, no change in status, stable for surgery.  I have reviewed the patient's chart and labs.  Questions were answered to the patient's satisfaction.     Larae Grooms

## 2016-05-02 NOTE — Progress Notes (Addendum)
PROGRESS NOTE  Donna Williamson  [id number] DOB: 08-Feb-1946 DOA: 05/01/2016 PCP: Jerlyn Ly, MD  Brief Narrative:   Donna Williamson is a 70 y.o. female with medical history significant for HTN, asthma, anxiety presenting with asthma exacerbation, with wheezing , dry cough and shortness of breath, as well as chest tightness. She reports these symptoms were similar to prior events. However, over the last 2 weeks she reports that she has been more fatigued, with increased difficulty to breathe, accompanied by bilateral leg swelling. The patient has been using her inhaler, but lately no relief has been reached . The patient does not wear oxygen at home. She has no history of PE or DVT. On presentation, she received breathing treatment with nebulizer, with improvement of her symptoms. She also reports that her chest tightness and tenderness has subsided. She denies any fevers, chills, productive cough, recent infections, vomiting or diarrhea. She denies any headache or neck pain, neck stiffness rashes, dysuria or hematuria.the patient denies being very active. No recent hospitalizations, long-distance trips, or surgeries. Takes hormonal supplements daily. Does not smoke.   Assessment & Plan:   Active Problems:   Chest pain   HTN (hypertension)   Hyperlipidemia   GERD (gastroesophageal reflux disease)   Hyperglycemia   Anemia   Anxiety   Depression   Asthma exacerbation   NSTEMI (non-ST elevated myocardial infarction) (HCC)   Acute hypoxic respiratory failure due to acute diastolic heart failure secondary to NSTEMI -  Troponin 12 on 7/3 -  Echocardiogram: Preserved ejection fraction, no regional wall motion abnormalities -  Anticipate right and left heart catheterization later today -  Appreciate cardiology assistance -  ontinue aspirin -  No beta blocker secondary to asthma exacerbation -  Continue high-dose statin and calcium channel blocker -  Continue heparin drip, started at  9 AM on 7/2 -  Continue nitroglycerin paste -  Daily weights -  Negative 2.7 L -  Continue Lasix 40 mg IV twice a day  Possible acute COPD exacerbation, wheezing improving. I suspect that the majority of her shortness of breath is secondary to heart failure -  Decrease solumedrol -  Continue xopenex -  Continue doxycycline  Possible CAP, particularly given right upper lobe findings on CT -  Continue doxycycline for 7 days  Hyperglycemia: no h/o DM. Likely due to steroids and consistently greater than 140 - add low-dose sliding scale insulin for now - A1c  Hypertension BP 150/74 mmHg  Pulse 113 Controlled Continue diltiazem  GERD, stable, continue prilosec  Peripheral neuropathy, stable, continue Neurontin   Anemia Hemoglobinon admission 10.9, currently 9.5mg /dl.  Baseline is 10-12.   -  Iron studies, B12, folate -  TSH -  Occult stool -  Repeat hgb in AM  Anxiety/Depression, stable, continue Xanax and Prozac  DVT prophylaxis:  Heparin drip Code Status:  Full code Family Communication:  Patient alone Disposition Plan:  Anticipated left and right heart catheterization later today. I have made the patient nothing by mouth   Consultants:   cardiology  Procedures:  Echocardiogram on 7/2  Antimicrobials:   Doxycycline 7/2    Subjective: She is overall feeling much better today.Her shortness of breath is improved and she is wheezing less. She continues to have some chest congestion and cough. The pressure or tightness on her chest is improving. She denies diaphoresis, nausea  Objective: Filed Vitals:   05/01/16 2059 05/02/16 0216 05/02/16 0425 05/02/16 0837  BP: 139/67  138/63   Pulse: 107  95   Temp: 98.6 F (37 C)  98.1 F (36.7 C)   TempSrc: Oral  Oral   Resp: 21  22   Weight:      SpO2: 96% 95% 98% 98%    Intake/Output Summary (Last 24 hours) at 05/02/16 1014 Last data filed at 05/02/16 0425  Gross per 24 hour  Intake    240 ml  Output   3001  ml  Net  -2761 ml   Filed Weights   05/01/16 0726  Weight: 110.6 kg (243 lb 13.3 oz)    Examination:  General exam:  Adult female.  No acute distress. Asal cannula in place HEENT:  NCAT, MMM Respiratory system:  iminished at the bilateral bases with rales heard to the midback, no obvious wheezeor rhonchi Cardiovascular system: tachycardic, regular rhythm, normal S1/S2. No murmurs, rubs, gallops or clicks.  Warm extremities Gastrointestinal system: Normal active bowel sounds, soft, nondistended, nontender. MSK:  Normal tone and bulk, 1+ pitting bilateral lower extremity edema Neuro:  Grossly intact    Data Reviewed: I have personally reviewed following labs and imaging studies  CBC:  Recent Labs Lab 05/01/16 0531 05/02/16 0113  WBC 20.3* 15.5*  NEUTROABS 17.4*  --   HGB 10.3* 9.5*  HCT 32.6* 31.0*  MCV 91.8 91.4  PLT 425* 99991111   Basic Metabolic Panel:  Recent Labs Lab 05/01/16 0531 05/01/16 0630 05/02/16 0113  NA 137  --  138  K 4.1  --  4.2  CL 107  --  106  CO2 20*  --  21*  GLUCOSE 174*  --  198*  BUN 21*  --  22*  CREATININE 1.09*  --  1.14*  CALCIUM 9.3  --  9.1  MG  --  1.9  --    GFR: Estimated Creatinine Clearance: 56.7 mL/min (by C-G formula based on Cr of 1.14). Liver Function Tests:  Recent Labs Lab 05/02/16 0113  AST 61*  ALT 33  ALKPHOS 69  BILITOT 0.4  PROT 6.4*  ALBUMIN 3.5   No results for input(s): LIPASE, AMYLASE in the last 168 hours. No results for input(s): AMMONIA in the last 168 hours. Coagulation Profile:  Recent Labs Lab 05/02/16 0113  INR 1.16   Cardiac Enzymes:  Recent Labs Lab 05/01/16 0531 05/01/16 1258 05/01/16 1918 05/02/16 0113  TROPONINI 0.67* 0.96* 7.03* 12.09*   BNP (last 3 results) No results for input(s): PROBNP in the last 8760 hours. HbA1C:  Recent Labs  05/01/16 1701  HGBA1C 5.8*   CBG:  Recent Labs Lab 05/01/16 1625 05/01/16 2105 05/02/16 0617  GLUCAP 224* 180* 173*   Lipid  Profile: No results for input(s): CHOL, HDL, LDLCALC, TRIG, CHOLHDL, LDLDIRECT in the last 72 hours. Thyroid Function Tests: No results for input(s): TSH, T4TOTAL, FREET4, T3FREE, THYROIDAB in the last 72 hours. Anemia Panel: No results for input(s): VITAMINB12, FOLATE, FERRITIN, TIBC, IRON, RETICCTPCT in the last 72 hours. Urine analysis:    Component Value Date/Time   COLORURINE YELLOW 05/01/2016 0719   APPEARANCEUR HAZY* 05/01/2016 0719   LABSPEC 1.006 05/01/2016 0719   PHURINE 6.0 05/01/2016 0719   GLUCOSEU NEGATIVE 05/01/2016 0719   HGBUR NEGATIVE 05/01/2016 0719   BILIRUBINUR NEGATIVE 05/01/2016 0719   BILIRUBINUR neg 02/16/2015 1851   KETONESUR NEGATIVE 05/01/2016 0719   PROTEINUR NEGATIVE 05/01/2016 0719   PROTEINUR neg 02/16/2015 1851   UROBILINOGEN 0.2 02/16/2015 1851   UROBILINOGEN 0.2 11/29/2013 1552   NITRITE NEGATIVE 05/01/2016 0719   NITRITE neg 02/16/2015  Ila 05/01/2016 0719   Sepsis Labs: @LABRCNTIP (procalcitonin:4,lacticidven:4)  ) Recent Results (from the past 240 hour(s))  Urine culture     Status: Abnormal   Collection Time: 05/01/16  7:19 AM  Result Value Ref Range Status   Specimen Description URINE, CLEAN CATCH  Final   Special Requests NONE  Final   Culture MULTIPLE SPECIES PRESENT, SUGGEST RECOLLECTION (A)  Final   Report Status 05/02/2016 FINAL  Final      Radiology Studies: Dg Chest 2 View  05/02/2016  CLINICAL DATA:  Shortness of breath and chest pain EXAM: CHEST  2 VIEW COMPARISON:  05/01/2016 FINDINGS: Cardiac shadow is stable and mildly enlarged. Mild vascular congestion is again seen. No focal interstitial edema is noted. No focal confluent infiltrate is seen. Minimal bilateral pleural effusions are again noted. IMPRESSION: Mild vascular congestion. Electronically Signed   By: Inez Catalina M.D.   On: 05/02/2016 08:26   Ct Angio Chest Pe W/cm &/or Wo Cm  05/01/2016  CLINICAL DATA:  Asthma attack with difficulty  breathing, initial encounter EXAM: CT ANGIOGRAPHY CHEST WITH CONTRAST TECHNIQUE: Multidetector CT imaging of the chest was performed using the standard protocol during bolus administration of intravenous contrast. Multiplanar CT image reconstructions and MIPs were obtained to evaluate the vascular anatomy. CONTRAST:  100 mL Isovue 370. COMPARISON:  Plain film from earlier in the same day. FINDINGS: Mediastinum/Lymph Nodes: No pulmonary emboli or thoracic aortic dissection identified. No masses or pathologically enlarged lymph nodes identified. Coronary calcifications are noted. Stable mild pericardial fluid is noted. Lungs/Pleura: Bilateral pleural effusions right greater than left are noted. Patchy infiltrative changes are noted in the right upper lobe. No parenchymal nodule is noted. Upper abdomen: No acute findings. Musculoskeletal: No chest wall mass or suspicious bone lesions identified. Review of the MIP images confirms the above findings. IMPRESSION: Bilateral pleural effusions. Patchy infiltrative changes are seen in the right upper lobe Electronically Signed   By: Inez Catalina M.D.   On: 05/01/2016 08:03   Dg Chest Port 1 View  05/01/2016  CLINICAL DATA:  Dyspnea today EXAM: PORTABLE CHEST 1 VIEW COMPARISON:  11/29/2013 FINDINGS: There is mild vascular and interstitial prominence which is new from 11/29/2013. This may represent mild congestive heart failure. No airspace consolidation. No large effusion. There is mild cardiomegaly. IMPRESSION: Mild cardiomegaly with vascular and interstitial prominence, likely mild congestive heart failure. Electronically Signed   By: Andreas Newport M.D.   On: 05/01/2016 05:08     Scheduled Meds: . ALPRAZolam  0.5 mg Oral Daily  . [START ON 05/03/2016] aspirin EC  81 mg Oral Daily  . diltiazem  240 mg Oral Daily  . doxycycline (VIBRAMYCIN) IV  100 mg Intravenous Q12H  . estradiol  1 mg Oral Daily  . FLUoxetine  10 mg Oral Daily  . furosemide  40 mg Intravenous  BID  . gabapentin  100 mg Oral QHS  . levalbuterol  0.63 mg Nebulization Q6H  . methylPREDNISolone (SOLU-MEDROL) injection  80 mg Intravenous Q8H  . montelukast  10 mg Oral QHS  . nitroGLYCERIN  0.5 inch Topical Q6H  . olmesartan  40 mg Oral Daily  . pantoprazole  80 mg Oral Daily  . rosuvastatin  20 mg Oral q1800  . sodium chloride flush  3 mL Intravenous Q12H   Continuous Infusions: . heparin 1,250 Units/hr (05/02/16 0117)     LOS: 1 day    Time spent: 30 min    Will Heinkel, MD [facility] Pager [phone]  If 7PM-7AM, please contact night-coverage [email or web address] Password [email or web address] 05/02/2016, 10:14 AM

## 2016-05-02 NOTE — Progress Notes (Signed)
[place] for Heparin Indication: chest pain/ACS  Allergies  Allergen Reactions  . Levaquin [Levofloxacin] Anaphylaxis  . Codeine Nausea Only  . Macrobid [Nitrofurantoin]   . Ciprofloxacin Rash  . Penicillins Rash  . Sulfa Antibiotics Rash    Patient Measurements: Weight: 243 lb 13.3 oz (110.6 kg)  IBW: 60 kg Heparin Dosing Weight: 85.5 kg  Vital Signs: Temp: 98.1 F (36.7 C) (07/03 0425) Temp Source: Oral (07/03 0425) BP: 138/63 mmHg (07/03 0425) Pulse Rate: 95 (07/03 0425)  Labs:  Recent Labs  05/01/16 0531 05/01/16 1258 05/01/16 1701 05/01/16 1918 05/02/16 0113 05/02/16 0836  HGB 10.3*  --   --   --  9.5*  --   HCT 32.6*  --   --   --  31.0*  --   PLT 425*  --   --   --  382  --   LABPROT  --   --   --   --  15.0  --   INR  --   --   --   --  1.16  --   HEPARINUNFRC  --   --  0.28*  --  0.36 0.38  CREATININE 1.09*  --   --   --  1.14*  --   TROPONINI 0.67* 0.96*  --  7.03* 12.09*  --     Estimated Creatinine Clearance: 56.7 mL/min (by C-G formula based on Cr of 1.14).   Assessment: Donna Williamson who presented to the [facility] on 7/2 with asthma exacerbation x 2 days with SOB and wheezing. The patient was also noted to have chest tightness with positive troponins so pharmacy has been consulted to start Heparin for r/o ACS. -heparin level is confirmed at goal (HL= 0.38). Plans for cath once respiratory status improves -Hg= 9.5, plt= 382  Goal of Therapy:  Heparin level 0.3-0.7 units/ml Monitor platelets by anticoagulation protocol: Yes   Plan:  -No heparin changes needed -daily HL and CBC  Hildred Laser, Pharm D 05/02/2016 9:17 AM

## 2016-05-02 NOTE — Care Management (Signed)
Pt may not use medications from home as identification and integrity of such medications can not be verified per pharmacy.

## 2016-05-02 NOTE — Care Management Note (Signed)
Case Management Note  Patient Details  Name: Donna Williamson MRN: [id number] Date of Birth: 11-Jun-1946  Subjective/Objective:                  Pt w/ PMH of HTN, Asthma, and obesity who presented  with a 1 month h/o progressive DOE, wt gain, and edema with wheezing and exertional chest heaviness over the last four days. From home with husband, independent with ADL's PTA and no DME usage.   PCP: Crist Infante   Action/Plan:  repeat stress test today 7/3 .....Marland Kitchen pending cardiac cath once SOB improves, CM to f/u with disposition needs.  Expected Discharge Date:                  Expected Discharge Plan:  Home/Self Care  In-House Referral:     Discharge planning Services  CM Consult  Post Acute Care Choice:    Choice offered to:   patient  DME Arranged:    DME Agency:     HH Arranged:    HH Agency:   Pt stated would like to use [place] for any home health services if needed @ d/c.  Status of Service:  In process, will continue to follow  If discussed at Long Length of Stay Meetings, dates discussed:    Additional Comments:  Sharin Mons, RN, [place] [phone] 05/02/2016, 10:27 AM

## 2016-05-02 NOTE — Care Management Important Message (Signed)
Important Message  Patient Details  Name: Donna Williamson MRN: [id number] Date of Birth: October 24, 1946   Medicare Important Message Given:  Yes    [name] May 05/02/2016, 10:47 AM

## 2016-05-02 NOTE — Progress Notes (Signed)
Hospital Problem List     Active Problems:   Chest pain   HTN (hypertension)   Hyperlipidemia   GERD (gastroesophageal reflux disease)   Hyperglycemia   Anemia   Anxiety   Depression   Asthma exacerbation   NSTEMI (non-ST elevated myocardial infarction) [facility])    Patient Profile:   Primary Cardiologist: Dr. Meda Coffee  70 y/o female w/ PMH of  HTN, Asthma, and obesity who presented to [facility] ED on 05/01/2016 with a 1 month h/o progressive DOE, wt gain, and edema with wheezing and exertional chest heaviness over the last four days.  Subjective   Respiratory status has improved. Denies any repeat episodes of chest pain since being admitted.  Inpatient Medications    . ALPRAZolam  0.5 mg Oral Daily  . aspirin EC  325 mg Oral Daily  . atorvastatin  80 mg Oral q1800  . diltiazem  240 mg Oral Daily  . doxycycline (VIBRAMYCIN) IV  100 mg Intravenous Q12H  . estradiol  1 mg Oral Daily  . FLUoxetine  10 mg Oral Daily  . furosemide  40 mg Intravenous BID  . gabapentin  100 mg Oral QHS  . levalbuterol  0.63 mg Nebulization Q6H  . methylPREDNISolone (SOLU-MEDROL) injection  80 mg Intravenous Q8H  . montelukast  10 mg Oral QHS  . olmesartan  40 mg Oral Daily  . pantoprazole  80 mg Oral Daily  . sodium chloride flush  3 mL Intravenous Q12H    Vital Signs    Filed Vitals:   05/01/16 2059 05/02/16 0216 05/02/16 0425 05/02/16 0837  BP: 139/67  138/63   Pulse: 107  95   Temp: 98.6 F (37 C)  98.1 F (36.7 C)   TempSrc: Oral  Oral   Resp: 21  22   Weight:      SpO2: 96% 95% 98% 98%    Intake/Output Summary (Last 24 hours) at 05/02/16 0929 Last data filed at 05/02/16 0425  Gross per 24 hour  Intake    240 ml  Output   3001 ml  Net  -2761 ml   Filed Weights   05/01/16 0726  Weight: 243 lb 13.3 oz (110.6 kg)    Physical Exam    General: Well developed, well nourished, female appearing in no acute distress. Head: Normocephalic, atraumatic.  Neck: Supple without bruits,  JVD at 9 cm. Lungs:  Resp regular and unlabored, expiratory wheezing in upper lung fields, mild rales at bases bilaterally. Heart: RRR, S1, S2, no S3, S4, or murmur; no rub. Abdomen: Soft, non-tender, non-distended with normoactive bowel sounds. No hepatomegaly. No rebound/guarding. No obvious abdominal masses. Extremities: No clubbing or cyanosis, 1+ edema bilaterally. Distal pedal pulses are 2+ bilaterally. Neuro: Alert and oriented X 3. Moves all extremities spontaneously. Psych: Normal affect.  Labs    CBC  Recent Labs  05/01/16 0531 05/02/16 0113  WBC 20.3* 15.5*  NEUTROABS 17.4*  --   HGB 10.3* 9.5*  HCT 32.6* 31.0*  MCV 91.8 91.4  PLT 425* 99991111   Basic Metabolic Panel  Recent Labs  05/01/16 0531 05/01/16 0630 05/02/16 0113  NA 137  --  138  K 4.1  --  4.2  CL 107  --  106  CO2 20*  --  21*  GLUCOSE 174*  --  198*  BUN 21*  --  22*  CREATININE 1.09*  --  1.14*  CALCIUM 9.3  --  9.1  MG  --  1.9  --  Liver Function Tests  Recent Labs  05/02/16 0113  AST 61*  ALT 33  ALKPHOS 69  BILITOT 0.4  PROT 6.4*  ALBUMIN 3.5   No results for input(s): LIPASE, AMYLASE in the last 72 hours. Cardiac Enzymes  Recent Labs  05/01/16 1258 05/01/16 1918 05/02/16 0113  TROPONINI 0.96* 7.03* 12.09*     Telemetry    NSR, HR in 90's - 110's.   ECG    NSR, HR 97, with no acute ST or T-wave changes.   Cardiac Studies and Radiology    Dg Chest 2 View  05/02/2016  CLINICAL DATA:  Shortness of breath and chest pain EXAM: CHEST  2 VIEW COMPARISON:  05/01/2016 FINDINGS: Cardiac shadow is stable and mildly enlarged. Mild vascular congestion is again seen. No focal interstitial edema is noted. No focal confluent infiltrate is seen. Minimal bilateral pleural effusions are again noted. IMPRESSION: Mild vascular congestion. Electronically Signed   By: Inez Catalina M.D.   On: 05/02/2016 08:26   Ct Angio Chest Pe W/cm &/or Wo Cm  05/01/2016  CLINICAL DATA:  Asthma attack  with difficulty breathing, initial encounter EXAM: CT ANGIOGRAPHY CHEST WITH CONTRAST TECHNIQUE: Multidetector CT imaging of the chest was performed using the standard protocol during bolus administration of intravenous contrast. Multiplanar CT image reconstructions and MIPs were obtained to evaluate the vascular anatomy. CONTRAST:  100 mL Isovue 370. COMPARISON:  Plain film from earlier in the same day. FINDINGS: Mediastinum/Lymph Nodes: No pulmonary emboli or thoracic aortic dissection identified. No masses or pathologically enlarged lymph nodes identified. Coronary calcifications are noted. Stable mild pericardial fluid is noted. Lungs/Pleura: Bilateral pleural effusions right greater than left are noted. Patchy infiltrative changes are noted in the right upper lobe. No parenchymal nodule is noted. Upper abdomen: No acute findings. Musculoskeletal: No chest wall mass or suspicious bone lesions identified. Review of the MIP images confirms the above findings. IMPRESSION: Bilateral pleural effusions. Patchy infiltrative changes are seen in the right upper lobe Electronically Signed   By: Inez Catalina M.D.   On: 05/01/2016 08:03   Dg Chest Port 1 View  05/01/2016  CLINICAL DATA:  Dyspnea today EXAM: PORTABLE CHEST 1 VIEW COMPARISON:  11/29/2013 FINDINGS: There is mild vascular and interstitial prominence which is new from 11/29/2013. This may represent mild congestive heart failure. No airspace consolidation. No large effusion. There is mild cardiomegaly. IMPRESSION: Mild cardiomegaly with vascular and interstitial prominence, likely mild congestive heart failure. Electronically Signed   By: Andreas Newport M.D.   On: 05/01/2016 05:08    Echocardiogram: 05/01/2016 Study Conclusions - Left ventricle: The cavity size was normal. Wall thickness was  normal. Systolic function was normal. The estimated ejection  fraction was in the range of 55% to 60%. Wall motion was normal;  there were no regional wall  motion abnormalities. The study is  not technically sufficient to allow evaluation of LV diastolic  function. - Mitral valve: There was mild regurgitation.  Impressions:  - Definity used; Normal LV systolic function; mild MR.  Assessment & Plan    1. Acute hypoxic respiratory failure - Likely multifactorial in the setting of a one month h/o progressive DOE, edema, and wt gain, with worsening dyspnea, wheezing, and exertional chest heaviness since 6/29.  - CXR suggest mild edema, while CTA also shows bilat pleural effusions and RUL infiltrate. WBC 20.3 (has recently had outpt steroids), improved to 15.5 this AM. Lactate improved from 7.2 to 1.4. - per admitting team  2. NSTEMI -  In the setting of above, pt has been having exertional chest heaviness since [id number].  - cyclic troponin values have been 0.67, 0.96, 7.03, and 12.09.  Ongoing risk factors include HTN and obesity. - continue ASA, heparin, high potency statin, and ntg paste for now. No ?-blocker in setting of asthma/wheezing. - she will need a definitive cardiac catheterization once respiratory status improves. The patient wishes to have this today so she is not hospitalized throughout July 4th, but I do not believe her respiratory status is optimal for a cath. Hgb also at 9.5 (was 12.2 one month ago). The patient understands that risks included but are not limited to stroke (1 in 1000), death (1 in 75), kidney failure [usually temporary] (1 in 500), bleeding (1 in 200), allergic reaction [possibly serious] (1 in 200). MD to discuss with patient cath today vs. Wednesday.  3. Acute Diastolic CHF Exacerbation - As above, 1 month h/o DOE, wt gain (10-15 lbs), lower ext edema, and increasing abd girth. BNP mildly elevated while cxr shows mild failure.  - Previously nl EF by SPECT in 10/2013. Echo this admission shows EF of 55-60% with no wall motion abnormalities. - started on IV Lasix 40mg  IV BID with a net output of -2.7 L  thus far. Continue with IV diuresis.   4. Acute exacerbation of asthma - Wheezing since Thursday 6/29.  - Nebs, steroids per IM.  5. Essential HTN - BP improved to 137/63 - 151/69 in the past 24 hours. - continue current medication regimen.  6. Lipids - Unknown.Will check lipid panel in the AM.  - has been started on high-intensity therapy in the setting of ACS. Intolerant to Lipitor in the past, will try Crestor.  Arna Medici , PA-C 9:29 AM 05/02/2016 Pager: [phone] Patient seen and examined and history reviewed. Agree with above findings and plan. Very pleasant 70 yo WF admitted with acute respiratory failure and NSTEMI. Still notes some left precordial pain when walking to BR. Still has dyspnea on exertion but states she is OK lying flat. Prior Myoview in 2015 normal but limited by poor exercise tolerance. On exam she is obese. Mild basilar rales. NSR. CV without gallop or murmur. 2+ edema. Ecg is without acute change. Troponin elevation to 12.  BNP elevated.  WBC is improving. Lactic acidosis resolved. Creatinine 1.14.  I personally reviewed Echo. This shows overall good LV function. No significant valvular disease. I think the posterior wall is hypokinetic.   Impression: 1. NSTEMI. ? Related to LCx disease. Possible stress related but troponin elevation is higher than I would expect for this. On ASA, nitrates and IV heparin. On po diltiazem and statin. Would avoid beta blocker for now given history of asthma but may need to consider selective beta blocker depending on cardiac evaluation. I have recommended a Frederickson today to define anatomy and filling pressures. The procedure and risks were reviewed including but not limited to death, myocardial infarction, stroke, arrythmias, bleeding, transfusion, emergency surgery, dye allergy, or renal dysfunction. The patient voices understanding and is agreeable to proceed. I think she is stable to proceed with cardiac cath  later today. 2. Acute respiratory failure- clearly a component of acute on chronic diastolic CHF. Diuresing well. Will continue IV lasix. RHC today. 3. HTN controlled 4. Obesity 5. Asthma exacerbation. 6. Chronic anemia. Exacerbated by acute illness. Follow Hgb.    Peter Martinique, [place] 05/02/2016 9:52 AM

## 2016-05-02 NOTE — Progress Notes (Signed)
ANTICOAGULATION CONSULT NOTE - Follow Up Consult  Pharmacy Consult for heparin Indication: NSTEMI  Labs:  Recent Labs  05/01/16 0531 05/01/16 1258 05/01/16 1701 05/01/16 1918 05/02/16 0113  HGB 10.3*  --   --   --  9.5*  HCT 32.6*  --   --   --  31.0*  PLT 425*  --   --   --  382  LABPROT  --   --   --   --  15.0  INR  --   --   --   --  1.16  HEPARINUNFRC  --   --  0.28*  --  0.36  CREATININE 1.09*  --   --   --   --   TROPONINI 0.67* 0.96*  --  7.03*  --     Assessment/Plan:  70yo female therapeutic on heparin after rate change. Will continue gtt at current rate and confirm stable with additional level.   Wynona Neat, PharmD, BCPS  05/02/2016,1:48 AM

## 2016-05-03 DIAGNOSIS — F419 Anxiety disorder, unspecified: Secondary | ICD-10-CM

## 2016-05-03 DIAGNOSIS — Z955 Presence of coronary angioplasty implant and graft: Secondary | ICD-10-CM

## 2016-05-03 DIAGNOSIS — E785 Hyperlipidemia, unspecified: Secondary | ICD-10-CM

## 2016-05-03 DIAGNOSIS — R739 Hyperglycemia, unspecified: Secondary | ICD-10-CM

## 2016-05-03 LAB — IRON AND TIBC
IRON: 63 ug/dL (ref 28–170)
Saturation Ratios: 16 % (ref 10.4–31.8)
TIBC: 385 ug/dL (ref 250–450)
UIBC: 322 ug/dL

## 2016-05-03 LAB — LIPID PANEL
CHOL/HDL RATIO: 2.4 ratio
Cholesterol: 186 mg/dL (ref 0–200)
HDL: 77 mg/dL (ref >40)
LDL CALC: 80 mg/dL (ref 0–99)
TRIGLYCERIDES: 144 mg/dL (ref <150)
VLDL: 29 mg/dL (ref 0–40)

## 2016-05-03 LAB — BASIC METABOLIC PANEL
ANION GAP: 10 (ref 5–15)
BUN: 35 mg/dL — AB (ref 6–20)
CHLORIDE: 103 mmol/L (ref 101–111)
CO2: 24 mmol/L (ref 22–32)
Calcium: 9 mg/dL (ref 8.9–10.3)
Creatinine, Ser: 1.3 mg/dL — ABNORMAL HIGH (ref 0.44–1.00)
GFR calc Af Amer: 46 mL/min — ABNORMAL LOW (ref >60)
GFR calc non Af Amer: 40 mL/min — ABNORMAL LOW (ref >60)
GLUCOSE: 147 mg/dL — AB (ref 65–99)
POTASSIUM: 3.8 mmol/L (ref 3.5–5.1)
Sodium: 137 mmol/L (ref 135–145)

## 2016-05-03 LAB — CBC
HCT: 31.7 % — ABNORMAL LOW (ref 36.0–46.0)
HEMOGLOBIN: 9.9 g/dL — AB (ref 12.0–15.0)
MCH: 28.3 pg (ref 26.0–34.0)
MCHC: 31.2 g/dL (ref 30.0–36.0)
MCV: 90.6 fL (ref 78.0–100.0)
PLATELETS: 411 10*3/uL — AB (ref 150–400)
RBC: 3.5 MIL/uL — AB (ref 3.87–5.11)
RDW: 13.8 % (ref 11.5–15.5)
WBC: 11.4 10*3/uL — ABNORMAL HIGH (ref 4.0–10.5)

## 2016-05-03 LAB — FERRITIN: FERRITIN: 45 ng/mL (ref 11–307)

## 2016-05-03 LAB — MAGNESIUM: MAGNESIUM: 2.2 mg/dL (ref 1.7–2.4)

## 2016-05-03 LAB — ALBUMIN: Albumin: 3.3 g/dL — ABNORMAL LOW (ref 3.5–5.0)

## 2016-05-03 LAB — TSH: TSH: 0.382 u[IU]/mL (ref 0.350–4.500)

## 2016-05-03 LAB — FOLATE: Folate: 33.7 ng/mL (ref >5.9)

## 2016-05-03 LAB — OCCULT BLOOD X 1 CARD TO LAB, STOOL: Fecal Occult Bld: NEGATIVE

## 2016-05-03 LAB — GLUCOSE, CAPILLARY
GLUCOSE-CAPILLARY: 217 mg/dL — AB (ref 65–99)
GLUCOSE-CAPILLARY: 147 mg/dL — AB (ref 65–99)
Glucose-Capillary: 158 mg/dL — ABNORMAL HIGH (ref 65–99)

## 2016-05-03 LAB — VITAMIN B12: Vitamin B-12: 625 pg/mL (ref 180–914)

## 2016-05-03 LAB — PHOSPHORUS: PHOSPHORUS: 4.1 mg/dL (ref 2.5–4.6)

## 2016-05-03 MED ORDER — DOXYCYCLINE HYCLATE 100 MG PO TABS
100.0000 mg | ORAL_TABLET | Freq: Two times a day (BID) | ORAL | Status: DC
  Administered 2016-05-03 – 2016-05-04 (×3): 100 mg via ORAL
  Filled 2016-05-03 (×4): qty 1

## 2016-05-03 MED ORDER — ADULT MULTIVITAMIN W/MINERALS CH
1.0000 | ORAL_TABLET | ORAL | Status: DC
  Administered 2016-05-03: 1 via ORAL
  Filled 2016-05-03: qty 1

## 2016-05-03 MED ORDER — ISOSORBIDE MONONITRATE ER 30 MG PO TB24
30.0000 mg | ORAL_TABLET | Freq: Every day | ORAL | Status: DC
  Administered 2016-05-03 – 2016-05-04 (×2): 30 mg via ORAL
  Filled 2016-05-03 (×2): qty 1

## 2016-05-03 MED ORDER — TICAGRELOR 90 MG PO TABS
90.0000 mg | ORAL_TABLET | Freq: Once | ORAL | Status: AC
  Administered 2016-05-03: 90 mg via ORAL

## 2016-05-03 MED ORDER — PREDNISONE 20 MG PO TABS
50.0000 mg | ORAL_TABLET | Freq: Every day | ORAL | Status: DC
  Administered 2016-05-04: 50 mg via ORAL
  Filled 2016-05-03: qty 2

## 2016-05-03 NOTE — Progress Notes (Signed)
TR BAND REMOVAL  LOCATION:    right radial  DEFLATED PER PROTOCOL:    Yes.    TIME BAND OFF / DRESSING APPLIED:    2150   SITE UPON ARRIVAL:    Level 1  SITE AFTER BAND REMOVAL:    Level 1  CIRCULATION SENSATION AND MOVEMENT:    Within Normal Limits   Yes.    COMMENTS:   Small amount of bruising noted upon initial assessment. No hematoma

## 2016-05-03 NOTE — Progress Notes (Signed)
Called report to [place] on 2W, patient transferred with all belongings

## 2016-05-03 NOTE — Progress Notes (Signed)
Patient Name: Donna Williamson Date of Encounter: 05/03/2016  Seen for followup NSTEMI and acute diastolic heart failure  Pt. Profile: Primary Cardiologist: Dr. Meda Coffee  70 y/o female w/ PMH of HTN, Asthma, and obesity who presented to [facility] ED on 05/01/2016 with a 1 month h/o progressive DOE, wt gain, and edema with wheezing and exertional chest heaviness over the last four days.  SUBJECTIVE  No chest pain. Respiratory status improved.   CURRENT MEDS . ALPRAZolam  0.5 mg Oral Daily  . aspirin EC  81 mg Oral Daily  . diltiazem  240 mg Oral Daily  . doxycycline (VIBRAMYCIN) IV  100 mg Intravenous Q12H  . enoxaparin (LOVENOX) injection  40 mg Subcutaneous Q24H  . estradiol  1 mg Oral Daily  . FLUoxetine  10 mg Oral Daily  . furosemide  40 mg Intravenous BID  . gabapentin  100 mg Oral QHS  . insulin aspart  0-9 Units Subcutaneous Q6H  . levalbuterol  0.63 mg Nebulization TID  . methylPREDNISolone (SOLU-MEDROL) injection  60 mg Intravenous BID  . montelukast  10 mg Oral QHS  . multivitamin with minerals  1 tablet Oral Daily  . nitroGLYCERIN  1 inch Topical Q6H  . olmesartan  40 mg Oral Daily  . pantoprazole  80 mg Oral Daily  . rosuvastatin  20 mg Oral q1800  . sodium chloride flush  3 mL Intravenous Q12H  . ticagrelor  90 mg Oral BID  . cyanocobalamin  500 mcg Oral Daily    OBJECTIVE  Filed Vitals:   05/02/16 2200 05/02/16 2337 05/03/16 0247 05/03/16 0600  BP: 158/68 161/73 145/77 164/81  Pulse: 77 79 70 72  Temp:  98.2 F (36.8 C)    TempSrc:  Oral    Resp: 22 18 18 16   Weight:      SpO2: 97% 96% 96% 97%    Intake/Output Summary (Last 24 hours) at 05/03/16 0730 Last data filed at 05/03/16 [id number]  Gross per 24 hour  Intake    720 ml  Output   3650 ml  Net  -2930 ml   Filed Weights   05/01/16 0726  Weight: 243 lb 13.3 oz (110.6 kg)    PHYSICAL EXAM  General: Pleasant, NAD. Neuro: Alert and oriented X 3. Moves all extremities spontaneously. Psych: Normal  affect. HEENT:  Normal  Neck: Supple without bruits/ + JVD. Lungs:  Resp regular and unlabored, CTA. Heart: RRR no s3, s4, or murmurs. Abdomen: Soft, non-tender, distended, BS + x 4.  Extremities: No clubbing, cyanosis. 2+ BL LE dema. DP/PT/Radials 2+ and equal bilaterally.  Accessory Clinical Findings  CBC  Recent Labs  05/01/16 0531 05/02/16 0113 05/03/16 0423  WBC 20.3* 15.5* 11.4*  NEUTROABS 17.4*  --   --   HGB 10.3* 9.5* 9.9*  HCT 32.6* 31.0* 31.7*  MCV 91.8 91.4 90.6  PLT 425* 382 123456*   Basic Metabolic Panel  Recent Labs  05/01/16 0630 05/02/16 0113 05/03/16 0423  NA  --  138 137  K  --  4.2 3.8  CL  --  106 103  CO2  --  21* 24  GLUCOSE  --  198* 147*  BUN  --  22* 35*  CREATININE  --  1.14* 1.30*  CALCIUM  --  9.1 9.0  MG 1.9  --  2.2  PHOS  --   --  4.1   Liver Function Tests  Recent Labs  05/02/16 0113 05/03/16 0423  AST 61*  --  ALT 33  --   ALKPHOS 69  --   BILITOT 0.4  --   PROT 6.4*  --   ALBUMIN 3.5 3.3*   Cardiac Enzymes  Recent Labs  05/01/16 1258 05/01/16 1918 05/02/16 0113  TROPONINI 0.96* 7.03* 12.09*   Hemoglobin A1C  Recent Labs  05/01/16 1701  HGBA1C 5.8*   Fasting Lipid Panel  Recent Labs  05/03/16 0423  CHOL 186  HDL 77  LDLCALC 80  TRIG 144  CHOLHDL 2.4   Thyroid Function Tests  Recent Labs  05/03/16 0423  TSH 0.382    TELE  Sinus rhythm with PVCs  Cardiac catheretization 05/02/2016: Procedures 05/02/16   Coronary Stent Intervention   Right/Left Heart Cath and Coronary Angiography    Conclusion     Prox LAD to Mid LAD lesion, 30% stenosed.  Mid LAD lesion, 50% stenosed at bifurcation of Ost 2nd Diag to 2nd Diag lesion, 70% stenosed.  1st Mrg-1 lesion, 95% stenosed. Post intervention with a 2.5 x 16 Synergy drug eluting stent post dilated to 2.8 mm, there is a 0% residual stenosis.  Moderate pulmonary HTN.  Elevated LVEDP. CO 8.7 L/min. CI 3.96.  Continue dual antiplatelet  therapy ideally for a year. She does have anemia but has had bleeding source is ruled out. Brilinta started. She would be a candidate for TWILIGHT study. She will need some diuresis over the next few days   Echocardiogram 05/01/2016: Study Conclusions  - Left ventricle: The cavity size was normal. Wall thickness was  normal. Systolic function was normal. The estimated ejection  fraction was in the range of 55% to 60%. Wall motion was normal;  there were no regional wall motion abnormalities. The study is  not technically sufficient to allow evaluation of LV diastolic  function. - Mitral valve: There was mild regurgitation.  Impressions:  - Definity used; Normal LV systolic function; mild MR.  Radiology/Studies  Dg Chest 2 View  05/02/2016  CLINICAL DATA:  Shortness of breath and chest pain EXAM: CHEST  2 VIEW COMPARISON:  05/01/2016 FINDINGS: Cardiac shadow is stable and mildly enlarged. Mild vascular congestion is again seen. No focal interstitial edema is noted. No focal confluent infiltrate is seen. Minimal bilateral pleural effusions are again noted. IMPRESSION: Mild vascular congestion. Electronically Signed   By: Inez Catalina M.D.   On: 05/02/2016 08:26   Ct Angio Chest Pe W/cm &/or Wo Cm  05/01/2016  CLINICAL DATA:  Asthma attack with difficulty breathing, initial encounter EXAM: CT ANGIOGRAPHY CHEST WITH CONTRAST TECHNIQUE: Multidetector CT imaging of the chest was performed using the standard protocol during bolus administration of intravenous contrast. Multiplanar CT image reconstructions and MIPs were obtained to evaluate the vascular anatomy. CONTRAST:  100 mL Isovue 370. COMPARISON:  Plain film from earlier in the same day. FINDINGS: Mediastinum/Lymph Nodes: No pulmonary emboli or thoracic aortic dissection identified. No masses or pathologically enlarged lymph nodes identified. Coronary calcifications are noted. Stable mild pericardial fluid is noted. Lungs/Pleura: Bilateral  pleural effusions right greater than left are noted. Patchy infiltrative changes are noted in the right upper lobe. No parenchymal nodule is noted. Upper abdomen: No acute findings. Musculoskeletal: No chest wall mass or suspicious bone lesions identified. Review of the MIP images confirms the above findings. IMPRESSION: Bilateral pleural effusions. Patchy infiltrative changes are seen in the right upper lobe Electronically Signed   By: Inez Catalina M.D.   On: 05/01/2016 08:03   Dg Chest Port 1 View  05/01/2016  CLINICAL DATA:  Dyspnea today EXAM: PORTABLE CHEST 1 VIEW COMPARISON:  11/29/2013 FINDINGS: There is mild vascular and interstitial prominence which is new from 11/29/2013. This may represent mild congestive heart failure. No airspace consolidation. No large effusion. There is mild cardiomegaly. IMPRESSION: Mild cardiomegaly with vascular and interstitial prominence, likely mild congestive heart failure. Electronically Signed   By: Andreas Newport M.D.   On: 05/01/2016 05:08    ASSESSMENT AND PLAN  1. NSTEMI - Peak of troponin was 12.09. Will get troponin level to see trend. S/p DES to 1st Marg lesion. She has residual disease in Prox LAD to Mid LAD lesion, 30% stenosed. Mid LAD lesion, 50% stenosed at bifurcation of Ost 2nd Diag to 2nd Diag lesion, 70% stenosed. - Consider selective beta blocker. Continue ASA, Brillinta, statin, ARB.   2. Acute on chronic diastolic CHF - Cath showed elevated LVEDP. Still has 2 + BL LE edema. Diuresed 5.6L so far. Weight not recorded. Will get daily weight. Continue IV diuresis.   3. HLD - 05/03/2016: Cholesterol 186; HDL 77; LDL Cholesterol 80; Triglycerides 144; VLDL 29  - LDL goal less than 70. Continue statin  4. HTN - Elevated this morning.   5. AKI - creatinine increased to 1.3 today from 1.14. Follow closely with diuresis.   Jarrett Soho PA-C Pager [phone]  Attending note:  Patient seen and examined. Discussed with Mr.  Matt Holmes, agree with his findings. She reports no chest pain today, has not ambulated as yet. Slept well. Now s/p DES to OM1 on 7/3, moderate residual disease to be managed medically. LVEF normal by echocardiogram, but has acute diastolic heart failure in setting of NSTEMI. Peak troponin I 12. On examination she appears comfortable, lungs without wheezing, 2+ leg edema. Creatinine 1.3, LDL 80, 9.9 (12.2 in June). Plan to diurese another 24 hours and reassess. Followup labs in AM. Possible discharge 7/5. Cardiac rehabilitation phase 1.   Satira Sark, M.D., F.A.C.C.

## 2016-05-03 NOTE — Progress Notes (Signed)
05/03/2016 0900 Received to room 2W34 a transfer from [facility].  Pt is A&O and without complaint.  Tele monitor applied and CCMD notified.  Oriented to room, call light and bed.  Call bell in reach. [place]

## 2016-05-03 NOTE — Progress Notes (Signed)
Site area: right brachial   Site Prior to Removal:  Level 0  Pressure Applied For 20 MINUTES    Minutes Beginning at [place]  Manual:yes     Patient Status During Pull:  Stable  Post Pull brachial Site:  Level 0  Post Pull Instructions Given:  Yes.    Post Pull Pulses Present:  Yes.    Dressing Applied:  Yes.    Comments:  Pressure dressing applied.

## 2016-05-03 NOTE — Progress Notes (Signed)
PROGRESS NOTE  Donna Williamson  [id number] DOB: [birth year] DOA: 05/01/2016 PCP: Jerlyn Ly, MD  Brief Narrative:   Donna Williamson is a 70 y.o. female with medical history significant for HTN, asthma, anxiety who presented with wheezing , dry cough, shortness of breath, and chest tightness. She reports these symptoms were similar to prior events. However, over the last 2 weeks she reports that she has been more fatigued, with increased difficulty to breathe, accompanied by bilateral leg swelling. The patient had been using her inhaler, but lately with no relief. The patient does not wear oxygen at home. She has no history of PE or DVT. On presentation, she received breathing treatment with nebulizer with improvement of her symptoms. She also reports that her chest tightness and tenderness has subsided. She denies any fevers, chills, productive cough, recent infections, vomiting or diarrhea. She denies any headache or neck pain, neck stiffness rashes, dysuria or hematuria.the patient denies being very active. No recent hospitalizations, long-distance trips, or surgeries. Takes hormonal supplements daily. Does not smoke.   Assessment & Plan:   Active Problems:   Chest pain   HTN (hypertension)   Hyperlipidemia   GERD (gastroesophageal reflux disease)   Hyperglycemia   Anemia   Anxiety   Depression   Asthma exacerbation   NSTEMI (non-ST elevated myocardial infarction) (HCC)   Acute diastolic heart failure (HCC)   Acute hypoxic respiratory failure due to acute diastolic heart failure and NSTEMI.  Suspect that most of her wheezing was secondary to heart failure as opposed to COPD exacerbation and is improving with diuresis -  Troponin peaked at 12 on 7/3 -  Echocardiogram: Preserved ejection fraction, no regional wall motion abnormalities -  Appreciate cardiology assistance -  Underwent right and left heart catheterization with coronary angiography on 05/02/2016:  Underwent  drug-eluting stent to first marginal OM1 -  Continue aspirin and brilinta for one year, or until July 3rd, 2018 -  No BB for now due to ongoing wheeze -  Continue high-dose statin and calcium channel blocker -  Start imdur -  -2.58 L -  Continue Lasix 40 mg IV twice a day  Possible acute COPD exacerbation, wheezing improving. I suspect that the majority of her shortness of breath is secondary to heart failure -  Discontinue solumedrol -  Continue xopenex -  Continue doxycycline day 3, last dose on 7/8  Possible CAP, particularly given right upper lobe findings on CT -  Continue doxycycline for 7 days  Hyperglycemia: no h/o DM. Likely due to steroids and consistently greater than 140, likely due to stress reaction from acute MI - Continue low-dose sliding scale insulin for now - A1c 5.8  Hypertension BP 150/74 mmHg  Pulse 113 Controlled Continue diltiazem  GERD, stable, continue prilosec  Peripheral neuropathy, stable, continue Neurontin   Anemia of chronic disease. Iron studies, vitamin B12, folate, and TSH are within normal limits  Anxiety/Depression, stable, continue Xanax and Prozac  DVT prophylaxis:  Lovenox Code Status:  Full code Family Communication:  Patient and her husband Disposition Plan:  Home pending further diuresis   Consultants:   cardiology  Procedures:  Echocardiogram on 7/2  Antimicrobials:   Doxycycline 7/2    Subjective: Her chest pain has resolved since her heart catheterization. She is feeling less [name] of breath. She continues to feel very [name] of breath walking to the bathroom. She has not attempted to walk in YET.  Objective: Filed Vitals:   05/03/16 0720 05/03/16 0810 05/03/16  0813 05/03/16 0906  BP:  168/68  161/65  Pulse:  80  75  Temp:  98 F (36.7 C)  97.7 F (36.5 C)  TempSrc:  Oral  Axillary  Resp:  19    Height: 5\' 6"  (1.676 m)     Weight: 106.6 kg (235 lb 0.2 oz)     SpO2:  97% 96% 95%    Intake/Output Summary  (Last 24 hours) at 05/03/16 1330 Last data filed at 05/03/16 1238  Gross per 24 hour  Intake   1065 ml  Output   3650 ml  Net  -2585 ml   Filed Weights   05/01/16 0726 05/03/16 0720  Weight: 110.6 kg (243 lb 13.3 oz) 106.6 kg (235 lb 0.2 oz)    Examination:  General exam:  Adult female.  No acute distress. HEENT:  NCAT, MMM Respiratory system:  Faint rales at the bilateral deep bases, otherwise clear to auscultation bilaterally Cardiovascular system: Regular rate and rhythm, normal S1/S2. No murmurs, rubs, gallops or clicks.  Warm extremities Gastrointestinal system: Normal active bowel sounds, soft, nondistended, nontender. MSK:  Normal tone and bulk, 1+ pitting bilateral lower extremity edema Neuro:  Grossly intact    Data Reviewed: I have personally reviewed following labs and imaging studies  CBC:  Recent Labs Lab 05/01/16 0531 05/02/16 0113 05/03/16 0423  WBC 20.3* 15.5* 11.4*  NEUTROABS 17.4*  --   --   HGB 10.3* 9.5* 9.9*  HCT 32.6* 31.0* 31.7*  MCV 91.8 91.4 90.6  PLT 425* 382 123456*   Basic Metabolic Panel:  Recent Labs Lab 05/01/16 0531 05/01/16 0630 05/02/16 0113 05/03/16 0423  NA 137  --  138 137  K 4.1  --  4.2 3.8  CL 107  --  106 103  CO2 20*  --  21* 24  GLUCOSE 174*  --  198* 147*  BUN 21*  --  22* 35*  CREATININE 1.09*  --  1.14* 1.30*  CALCIUM 9.3  --  9.1 9.0  MG  --  1.9  --  2.2  PHOS  --   --   --  4.1   GFR: Estimated Creatinine Clearance: 48.3 mL/min (by C-G formula based on Cr of 1.3). Liver Function Tests:  Recent Labs Lab 05/02/16 0113 05/03/16 0423  AST 61*  --   ALT 33  --   ALKPHOS 69  --   BILITOT 0.4  --   PROT 6.4*  --   ALBUMIN 3.5 3.3*   No results for input(s): LIPASE, AMYLASE in the last 168 hours. No results for input(s): AMMONIA in the last 168 hours. Coagulation Profile:  Recent Labs Lab 05/02/16 0113  INR 1.16   Cardiac Enzymes:  Recent Labs Lab 05/01/16 0531 05/01/16 1258 05/01/16 1918  05/02/16 0113  TROPONINI 0.67* 0.96* 7.03* 12.09*   BNP (last 3 results) No results for input(s): PROBNP in the last 8760 hours. HbA1C:  Recent Labs  05/01/16 1701  HGBA1C 5.8*   CBG:  Recent Labs Lab 05/02/16 1056 05/02/16 1615 05/02/16 1751 05/02/16 2346 05/03/16 0559  GLUCAP 187* 163* 162* 161* 158*   Lipid Profile:  Recent Labs  05/02/16 1555 05/03/16 0423  CHOL 186 186  HDL 79 77  LDLCALC 84 80  TRIG 116 144  CHOLHDL 2.4 2.4   Thyroid Function Tests:  Recent Labs  05/03/16 0423  TSH 0.382   Anemia Panel:  Recent Labs  05/03/16 0423  VITAMINB12 625  FOLATE 33.7  FERRITIN 45  TIBC 385  IRON 63   Urine analysis:    Component Value Date/Time   COLORURINE YELLOW 05/01/2016 0719   APPEARANCEUR HAZY* 05/01/2016 0719   LABSPEC 1.006 05/01/2016 0719   PHURINE 6.0 05/01/2016 0719   GLUCOSEU NEGATIVE 05/01/2016 0719   HGBUR NEGATIVE 05/01/2016 0719   BILIRUBINUR NEGATIVE 05/01/2016 0719   BILIRUBINUR neg 02/16/2015 1851   KETONESUR NEGATIVE 05/01/2016 0719   PROTEINUR NEGATIVE 05/01/2016 0719   PROTEINUR neg 02/16/2015 1851   UROBILINOGEN 0.2 02/16/2015 1851   UROBILINOGEN 0.2 11/29/2013 1552   NITRITE NEGATIVE 05/01/2016 0719   NITRITE neg 02/16/2015 1851   LEUKOCYTESUR SMALL* 05/01/2016 0719   Sepsis Labs: @LABRCNTIP (procalcitonin:4,lacticidven:4)  ) Recent Results (from the past 240 hour(s))  Blood culture (routine x 2)     Status: None (Preliminary result)   Collection Time: 05/01/16  5:31 AM  Result Value Ref Range Status   Specimen Description BLOOD BLOOD RIGHT FOREARM  Final   Special Requests IN PEDIATRIC BOTTLE 5CC  Final   Culture NO GROWTH 2 DAYS  Final   Report Status PENDING  Incomplete  Urine culture     Status: Abnormal   Collection Time: 05/01/16  7:19 AM  Result Value Ref Range Status   Specimen Description URINE, CLEAN CATCH  Final   Special Requests NONE  Final   Culture MULTIPLE SPECIES PRESENT, SUGGEST  RECOLLECTION (A)  Final   Report Status 05/02/2016 FINAL  Final  Blood culture (routine x 2)     Status: None (Preliminary result)   Collection Time: 05/01/16  7:20 AM  Result Value Ref Range Status   Specimen Description BLOOD RIGHT FOREARM  Final   Special Requests IN PEDIATRIC BOTTLE 3CC  Final   Culture NO GROWTH 2 DAYS  Final   Report Status PENDING  Incomplete      Radiology Studies: Dg Chest 2 View  05/02/2016  CLINICAL DATA:  Shortness of breath and chest pain EXAM: CHEST  2 VIEW COMPARISON:  05/01/2016 FINDINGS: Cardiac shadow is stable and mildly enlarged. Mild vascular congestion is again seen. No focal interstitial edema is noted. No focal confluent infiltrate is seen. Minimal bilateral pleural effusions are again noted. IMPRESSION: Mild vascular congestion. Electronically Signed   By: Inez Catalina M.D.   On: 05/02/2016 08:26     Scheduled Meds: . ALPRAZolam  0.5 mg Oral Daily  . aspirin EC  81 mg Oral Daily  . diltiazem  240 mg Oral Daily  . doxycycline  100 mg Oral Q12H  . enoxaparin (LOVENOX) injection  40 mg Subcutaneous Q24H  . estradiol  1 mg Oral Daily  . FLUoxetine  10 mg Oral Daily  . furosemide  40 mg Intravenous BID  . gabapentin  100 mg Oral QHS  . insulin aspart  0-9 Units Subcutaneous Q6H  . methylPREDNISolone (SOLU-MEDROL) injection  60 mg Intravenous BID  . montelukast  10 mg Oral QHS  . multivitamin with minerals  1 tablet Oral Q24H  . olmesartan  40 mg Oral Daily  . pantoprazole  80 mg Oral Daily  . rosuvastatin  20 mg Oral q1800  . sodium chloride flush  3 mL Intravenous Q12H  . ticagrelor  90 mg Oral BID  . cyanocobalamin  500 mcg Oral Daily   Continuous Infusions:     LOS: 2 days    Time spent: 30 min    Janece Canterbury, MD [facility] Pager [phone]  If 7PM-7AM, please contact night-coverage [email or web address] Password [email or web address] 05/03/2016, 1:30 PM

## 2016-05-04 ENCOUNTER — Telehealth: Payer: Self-pay | Admitting: Physician Assistant

## 2016-05-04 ENCOUNTER — Encounter (HOSPITAL_COMMUNITY): Payer: Self-pay | Admitting: Interventional Cardiology

## 2016-05-04 LAB — GLUCOSE, CAPILLARY
GLUCOSE-CAPILLARY: 133 mg/dL — AB (ref 65–99)
GLUCOSE-CAPILLARY: 146 mg/dL — AB (ref 65–99)
GLUCOSE-CAPILLARY: 148 mg/dL — AB (ref 65–99)

## 2016-05-04 LAB — RENAL FUNCTION PANEL
Albumin: 3 g/dL — ABNORMAL LOW (ref 3.5–5.0)
Anion gap: 11 (ref 5–15)
BUN: 37 mg/dL — AB (ref 6–20)
CALCIUM: 8.7 mg/dL — AB (ref 8.9–10.3)
CO2: 26 mmol/L (ref 22–32)
CREATININE: 1.33 mg/dL — AB (ref 0.44–1.00)
Chloride: 103 mmol/L (ref 101–111)
GFR, EST AFRICAN AMERICAN: 45 mL/min — AB (ref >60)
GFR, EST NON AFRICAN AMERICAN: 39 mL/min — AB (ref >60)
Glucose, Bld: 137 mg/dL — ABNORMAL HIGH (ref 65–99)
POTASSIUM: 3.7 mmol/L (ref 3.5–5.1)
Phosphorus: 5.1 mg/dL — ABNORMAL HIGH (ref 2.5–4.6)
Sodium: 140 mmol/L (ref 135–145)

## 2016-05-04 LAB — CBC
HCT: 30.1 % — ABNORMAL LOW (ref 36.0–46.0)
Hemoglobin: 9.6 g/dL — ABNORMAL LOW (ref 12.0–15.0)
MCH: 28.7 pg (ref 26.0–34.0)
MCHC: 31.9 g/dL (ref 30.0–36.0)
MCV: 90.1 fL (ref 78.0–100.0)
PLATELETS: 372 10*3/uL (ref 150–400)
RBC: 3.34 MIL/uL — AB (ref 3.87–5.11)
RDW: 13.5 % (ref 11.5–15.5)
WBC: 12.4 10*3/uL — ABNORMAL HIGH (ref 4.0–10.5)

## 2016-05-04 LAB — MAGNESIUM: Magnesium: 2.3 mg/dL (ref 1.7–2.4)

## 2016-05-04 MED ORDER — ALBUTEROL SULFATE (2.5 MG/3ML) 0.083% IN NEBU: 2.5000 mg | INHALATION_SOLUTION | RESPIRATORY_TRACT | Status: AC | PRN

## 2016-05-04 MED ORDER — ROSUVASTATIN CALCIUM 20 MG PO TABS: 20.0000 mg | ORAL_TABLET | Freq: Every day | ORAL | Status: DC

## 2016-05-04 MED ORDER — PREDNISONE 20 MG PO TABS: 40.0000 mg | ORAL_TABLET | Freq: Every day | ORAL | Status: DC

## 2016-05-04 MED ORDER — TICAGRELOR 90 MG PO TABS: 90.0000 mg | ORAL_TABLET | Freq: Two times a day (BID) | ORAL | Status: DC

## 2016-05-04 MED ORDER — FUROSEMIDE 20 MG PO TABS: 20.0000 mg | ORAL_TABLET | Freq: Every day | ORAL | Status: DC

## 2016-05-04 MED ORDER — FUROSEMIDE 20 MG PO TABS
20.0000 mg | ORAL_TABLET | Freq: Every day | ORAL | Status: DC
  Administered 2016-05-04: 20 mg via ORAL
  Filled 2016-05-04: qty 1

## 2016-05-04 MED ORDER — DOXYCYCLINE HYCLATE 100 MG PO TABS: 100.0000 mg | ORAL_TABLET | Freq: Two times a day (BID) | ORAL | Status: DC

## 2016-05-04 MED ORDER — ISOSORBIDE MONONITRATE ER 30 MG PO TB24: 30.0000 mg | ORAL_TABLET | Freq: Every day | ORAL | Status: DC

## 2016-05-04 NOTE — Telephone Encounter (Signed)
TCM   05/10/2016 Status:   Time: 8:30 AM  Visit Type: OFFICE VISIT 30 [368] Liliane Shi, PA-C   Notes: 7/5 [place] S.TCM,ks

## 2016-05-04 NOTE — Discharge Instructions (Signed)
PLEASE REMEMBER TO BRING ALL OF YOUR MEDICATIONS TO EACH OF YOUR FOLLOW-UP OFFICE VISITS. ° °PLEASE ATTEND ALL SCHEDULED FOLLOW-UP APPOINTMENTS.  ° °Activity: Increase activity slowly as tolerated. You may shower, but no soaking baths (or swimming) for 1 week. No driving for 1 week. No lifting over 10 lbs for 2 weeks. No sexual activity for 2 weeks.  ° °You May Return to Work: in 1 week (if applicable) ° °Wound Care: You may wash cath site gently with soap and water. Keep cath site clean and dry. If you notice pain, swelling, bleeding or pus at your cath site, please call [phone]. ° ° °

## 2016-05-04 NOTE — Telephone Encounter (Signed)
Pt is still currently in the hospital.  Plan for discharge is today 7/5, per Cardiology progress note.  Triage to follow-up with the pt tomorrow, for TCM call.

## 2016-05-04 NOTE — Research (Signed)
Patient is enrolled in the [employer].  Oral and written discharge instructions have been given to the patient regarding the [facility].  The following study medications were dispensed to the subject: Aspirin Bottle Assignment [id number] and Ticagrelor Bottle Assignment [id number].   Blossom Hoops, RN, Research Nurse [phone]

## 2016-05-04 NOTE — Care Management Note (Signed)
Case Management Note  Patient Details  Name: KATRICE BUDAY MRN: [id number] Date of Birth: 09/24/1946  Subjective/Objective:    70 y.o. F to be discharged home with no CM needs                 Action/Plan:CM will sign off for now but will be available should additional discharge needs arise or disposition change.    Expected Discharge Date:                  Expected Discharge Plan:  Home/Self Care  In-House Referral:     Discharge planning Services  CM Consult  Post Acute Care Choice:    Choice offered to:     DME Arranged:    DME Agency:     HH Arranged:    [place] Agency:     Status of Service:  Completed, signed off  If discussed at [employer] of Stay Meetings, dates discussed:    Additional Comments:  Delrae Sawyers, RN 05/04/2016, 11:17 AM

## 2016-05-04 NOTE — Research (Signed)
Subject has agreed to participate in the [facility].  She will receive a three month supply of ticagrelor (90 mg twice a day) and aspirin (81 mg once daily) from the study team. The study medication will be dispensed prior to discharge today.  At 3 months, if eligible, she will stay on the ticagrelor for an additional 12 months but will be randomized to either stay on the low dose aspirin, or receive a placebo for an additional 12 months.   Blossom Hoops, RN, Research Nurse [phone]

## 2016-05-04 NOTE — Progress Notes (Signed)
Hospital Problem List     Active Problems:   Chest pain   HTN (hypertension)   Hyperlipidemia   GERD (gastroesophageal reflux disease)   Hyperglycemia   Anemia   Anxiety   Depression   Asthma exacerbation   NSTEMI (non-ST elevated myocardial infarction) ([place])   Acute diastolic heart failure ([place])   Stented coronary artery     Patient Profile:   Primary Cardiologist: Wishes to follow-up with Dr. Burt Knack (Patient's Husband's Primary Cardiologist)  70 y/o female w/ PMH of HTN, Asthma, and obesity who presented to [facility] ED on 05/01/2016 with a 1 month h/o progressive DOE, wt gain, and edema with wheezing and exertional chest heaviness over the last four days  Subjective   Reports significant improvement in lower extremity edema. Breathing easier. No repeat episodes of chest pressure.  Inpatient Medications    . ALPRAZolam  0.5 mg Oral Daily  . aspirin EC  81 mg Oral Daily  . diltiazem  240 mg Oral Daily  . doxycycline  100 mg Oral Q12H  . enoxaparin (LOVENOX) injection  40 mg Subcutaneous Q24H  . estradiol  1 mg Oral Daily  . FLUoxetine  10 mg Oral Daily  . furosemide  40 mg Intravenous BID  . gabapentin  100 mg Oral QHS  . insulin aspart  0-9 Units Subcutaneous Q6H  . isosorbide mononitrate  30 mg Oral Daily  . montelukast  10 mg Oral QHS  . multivitamin with minerals  1 tablet Oral Q24H  . olmesartan  40 mg Oral Daily  . pantoprazole  80 mg Oral Daily  . predniSONE  50 mg Oral Q breakfast  . rosuvastatin  20 mg Oral q1800  . sodium chloride flush  3 mL Intravenous Q12H  . ticagrelor  90 mg Oral BID  . cyanocobalamin  500 mcg Oral Daily    Vital Signs    Filed Vitals:   05/03/16 0906 05/03/16 1418 05/03/16 2151 05/04/16 0506  BP: 161/65 140/60 144/73 151/69  Pulse: 75 78 73 83  Temp: 97.7 F (36.5 C) 98.6 F (37 C) 97.8 F (36.6 C) 97.8 F (36.6 C)  TempSrc: Axillary Oral Oral Oral  Resp:  16 18 18   Height:      Weight:    232 lb 9.4 oz (105.5 kg)    SpO2: 95%  95% 94%    Intake/Output Summary (Last 24 hours) at 05/04/16 0741 Last data filed at 05/03/16 1800  Gross per 24 hour  Intake    705 ml  Output      0 ml  Net    705 ml   Filed Weights   05/01/16 0726 05/03/16 0720 05/04/16 0506  Weight: 243 lb 13.3 oz (110.6 kg) 235 lb 0.2 oz (106.6 kg) 232 lb 9.4 oz (105.5 kg)    Physical Exam    General: Well developed, well nourished, female appearing in no acute distress. Head: Normocephalic, atraumatic.  Neck: Supple without bruits, JVD not elevated. Lungs:  Resp regular and unlabored, CTA without wheezing or rales. Heart: RRR, S1, S2, no S3, S4, or murmur; no rub. Abdomen: Soft, non-tender, non-distended with normoactive bowel sounds. No hepatomegaly. No rebound/guarding. No obvious abdominal masses. Extremities: No clubbing or cyanosis, trace edema along RLE. Distal pedal pulses are 2+ bilaterally. Right radial cath site without ecchymosis or evidence of a hematoma. Neuro: Alert and oriented X 3. Moves all extremities spontaneously. Psych: Normal affect.  Labs    CBC  Recent Labs  05/03/16  0423 05/04/16 0321  WBC 11.4* 12.4*  HGB 9.9* 9.6*  HCT 31.7* 30.1*  MCV 90.6 90.1  PLT 411* [id number]   Basic Metabolic Panel  Recent Labs  05/03/16 0423 05/04/16 0321  NA 137 140  K 3.8 3.7  CL 103 103  CO2 24 26  GLUCOSE 147* 137*  BUN 35* 37*  CREATININE 1.30* 1.33*  CALCIUM 9.0 8.7*  MG 2.2 2.3  PHOS 4.1 5.1*   Liver Function Tests  Recent Labs  05/02/16 0113 05/03/16 0423 05/04/16 0321  AST 61*  --   --   ALT 33  --   --   ALKPHOS 69  --   --   BILITOT 0.4  --   --   PROT 6.4*  --   --   ALBUMIN 3.5 3.3* 3.0*   No results for input(s): LIPASE, AMYLASE in the last 72 hours. Cardiac Enzymes  Recent Labs  05/01/16 1258 05/01/16 1918 05/02/16 0113  TROPONINI 0.96* 7.03* 12.09*   BNP Invalid input(s): POCBNP D-Dimer No results for input(s): DDIMER in the last 72 hours. Hemoglobin A1C  Recent  Labs  05/01/16 1701  HGBA1C 5.8*   Fasting Lipid Panel  Recent Labs  05/03/16 0423  CHOL 186  HDL 77  LDLCALC 80  TRIG 144  CHOLHDL 2.4   Thyroid Function Tests  Recent Labs  05/03/16 0423  TSH 0.382    Telemetry    NSR, HR in 60's - 70's. Up to 100's with activity, but NSR. Occasional PVC's.   ECG    No new tracings.   Cardiac Studies and Radiology    Dg Chest 2 View  05/02/2016  CLINICAL DATA:  Shortness of breath and chest pain EXAM: CHEST  2 VIEW COMPARISON:  05/01/2016 FINDINGS: Cardiac shadow is stable and mildly enlarged. Mild vascular congestion is again seen. No focal interstitial edema is noted. No focal confluent infiltrate is seen. Minimal bilateral pleural effusions are again noted. IMPRESSION: Mild vascular congestion. Electronically Signed   By: Inez Catalina M.D.   On: 05/02/2016 08:26   Ct Angio Chest Pe W/cm &/or Wo Cm  05/01/2016  CLINICAL DATA:  Asthma attack with difficulty breathing, initial encounter EXAM: CT ANGIOGRAPHY CHEST WITH CONTRAST TECHNIQUE: Multidetector CT imaging of the chest was performed using the standard protocol during bolus administration of intravenous contrast. Multiplanar CT image reconstructions and MIPs were obtained to evaluate the vascular anatomy. CONTRAST:  100 mL Isovue 370. COMPARISON:  Plain film from earlier in the same day. FINDINGS: Mediastinum/Lymph Nodes: No pulmonary emboli or thoracic aortic dissection identified. No masses or pathologically enlarged lymph nodes identified. Coronary calcifications are noted. Stable mild pericardial fluid is noted. Lungs/Pleura: Bilateral pleural effusions right greater than left are noted. Patchy infiltrative changes are noted in the right upper lobe. No parenchymal nodule is noted. Upper abdomen: No acute findings. Musculoskeletal: No chest wall mass or suspicious bone lesions identified. Review of the MIP images confirms the above findings. IMPRESSION: Bilateral pleural effusions.  Patchy infiltrative changes are seen in the right upper lobe Electronically Signed   By: Inez Catalina M.D.   On: 05/01/2016 08:03   Dg Chest Port 1 View  05/01/2016  CLINICAL DATA:  Dyspnea today EXAM: PORTABLE CHEST 1 VIEW COMPARISON:  11/29/2013 FINDINGS: There is mild vascular and interstitial prominence which is new from 11/29/2013. This may represent mild congestive heart failure. No airspace consolidation. No large effusion. There is mild cardiomegaly. IMPRESSION: Mild cardiomegaly with vascular and interstitial prominence,  likely mild congestive heart failure. Electronically Signed   By: Andreas Newport M.D.   On: 05/01/2016 05:08    Cardiac Catheterization: 05/02/2016   Prox LAD to Mid LAD lesion, 30% stenosed.  Mid LAD lesion, 50% stenosed at bifurcation of Ost 2nd Diag to 2nd Diag lesion, 70% stenosed.  1st Mrg-1 lesion, 95% stenosed. Post intervention with a 2.5 x 16 Synergy drug eluting stent post dilated to 2.8 mm, there is a 0% residual stenosis.  Moderate pulmonary HTN.  Elevated LVEDP. CO 8.7 L/min. CI 3.96.  Continue dual antiplatelet therapy ideally for a year. She does have anemia but has had bleeding source is ruled out. Brilinta started. She would be a candidate for TWILIGHT study. She will need some diuresis over the next few days.  Assessment & Plan    1. NSTEMI - presented with exertional chest heaviness since 6/29.Cyclic troponin values were 0.67, 0.96, 7.03, and 12.09. Ongoing risk factors include HTN and obesity. - cath on 7/3 showed 95% stenosis of 1st Mrg with DES placed. Residual 30% stenosis in prox-mid LAD and 50% stenosis Mid LAD. 70% stenosis 2nd Diag. - no BB due to asthma. Continue ASA, Brilinta, statin, and ARB. Patient planning to enroll in the TWILIGHT study. If so, she will receive ASA and Brilinta from the study and will not need an Rx for these at time of discharge.  2. Acute Diastolic CHF Exacerbation - 1 month h/o DOE, wt gain (10-15  lbs), lower ext edema, and increasing abd girth. BNP mildly elevated while cxr shows mild failure.  - Previously nl EF by SPECT in 10/2013. Echo this admission shows EF of 55-60% with no wall motion abnormalities. - started on IV Lasix 40mg  BID with a net output of -4.8 L thus far. Only a recorded output of -329mL yesterday, yet weight declined by 3 lbs. Overall, weight reported as down 11 lbs since admission.  - appears close to baseline on exam (trace RLE edema). Creatinine increasing up to 1.33, so we are likely at our limit with diuresis. Will change IV Lasix to PO Lasix 20mg  daily.  3. Acute exacerbation of asthma - Wheezing since Thursday 6/29.  - Nebs, steroids per IM.  4. Essential HTN - continue current medication regimen.  5. Lipids - LDL at 80 this admission.  - Intolerant to Lipitor in the past, will try Crestor  6. Acute on Chronic Stage 3 CKD - creatinine 1.09 on admission, increased to 1.33 today in the setting of IV diuresis.   Likely stable for discharge from a Cardiology perspective. A 7-day TCM appointment has been arranged for 05/10/2016 and this has been included in her AVS information. Will need a repeat BMET at that time.  Arna Medici , PA-C 7:41 AM 05/04/2016 Pager: [phone] Patient seen and examined and history reviewed. Agree with above findings and plan. Patient has responded very well to diuresis. No SOB or chest pain. Edema much better. Weight down 15 lbs. Agree with switching to po lasix today. She is ready for DC today from our standpoint. Discussed importance of daily weight and follow up with outpatient cardiac Rehab. I will follow up as outpatient. Patient wants to remain out of work for 2-4 weeks and I think that is fine.   Shadae Reino [place], [place] 05/04/2016 9:35 AM

## 2016-05-04 NOTE — Discharge Summary (Signed)
Physician Discharge Summary  Donna Williamson [id number] DOB: [birth year] DOA: 05/01/2016  PCP: Jerlyn Ly, MD  Admit date: 05/01/2016 Discharge date: 05/04/2016  Admitted From:home  Disposition:  Home   Recommendations for Outpatient Follow-up:  1. Follow up with PCP in 1-2 weeks 2. Please obtain BMP/CBC in one week 3. Please follow up with cardiology as recommended.     Discharge Condition:stable.  CODE STATUS:full code.  Diet recommendation: Heart Healthy /  Brief/Interim Summary: Donna Williamson is a 70 y.o. female with medical history significant for HTN, asthma, anxiety who presented with wheezing , dry cough, shortness of breath, and chest tightness. She reports these symptoms were similar to prior events. However, over the last 2 weeks she reports that she has been more fatigued, with increased difficulty to breathe, accompanied by bilateral leg swelling.she was admitted for acute hypoxic respiratory failure from diastolic heart failure and NSTEMI.   Discharge Diagnoses:  Active Problems:   Chest pain   HTN (hypertension)   Hyperlipidemia   GERD (gastroesophageal reflux disease)   Hyperglycemia   Anemia   Anxiety   Depression   Asthma exacerbation   NSTEMI (non-ST elevated myocardial infarction) (HCC)   Acute diastolic heart failure (HCC)   Stented coronary artery   Acute hypoxic respiratory failure due to acute diastolic heart failure and NSTEMI. Suspect that most of her wheezing was secondary to heart failure as opposed to COPD exacerbation and is improving with diuresis - Echocardiogram: Preserved ejection fraction, no regional wall motion abnormalities - Appreciate cardiology assistance - Underwent right and left heart catheterization with coronary angiography on 05/02/2016: Underwent drug-eluting stent to first marginal OM1 - Continue aspirin and brilinta for one year, or until July 3rd, 2018 - No BB for now due to ongoing wheeze - Continue high-dose  statin and calcium channel blocker and imdur. She was initailly started on IV lasix 40 mg bid and it was changed to oral 20 mg daily lasix as per cardiology.    Possible acute COPD exacerbation, wheezing improving. I suspect that the majority of her shortness of breath is secondary to heart failure Quick taper of steroids and resume doxy to complete the course and resume bronchodilators as needed.   Possible CAP, particularly given right upper lobe findings on CT - Continue doxycycline for a course of  7 days  Hyperglycemia: no h/o DM. Likely due to steroids and consistently greater than 140, likely due to stress reaction from acute MI - A1c 5.8  Hypertension Controlled Continue diltiazem  GERD, stable, continue prilosec  Peripheral neuropathy, stable, continue Neurontin   Anemia of chronic disease. Iron studies, vitamin B12, folate, and TSH are within normal limits  Anxiety/Depression, stable, continue Xanax and Prozac  Discharge Instructions      Discharge Instructions    (HEART FAILURE PATIENTS) Call MD:  Anytime you have any of the following symptoms: 1) 3 pound weight gain in 24 hours or 5 pounds in 1 week 2) shortness of breath, with or without a dry hacking cough 3) swelling in the hands, feet or stomach 4) if you have to sleep on extra pillows at night in order to breathe.    Complete by:  As directed      Amb Referral to Cardiac Rehabilitation    Complete by:  As directed   Diagnosis:   NSTEMI Coronary Stents       Amb Referral to Cardiac Rehabilitation    Complete by:  As directed   Diagnosis:   NSTEMI  Coronary Stents       Diet - low sodium heart healthy    Complete by:  As directed      Discharge instructions    Complete by:  As directed   Please follow up with cardiology in one week as recommended.  Please check BMP to check your creatinine.            Medication List    STOP taking these medications        ibuprofen 600 MG tablet  Commonly known  as:  ADVIL,MOTRIN     mupirocin ointment 2 %  Commonly known as:  BACTROBAN      TAKE these medications        ALPRAZolam 0.5 MG tablet  Commonly known as:  XANAX  Take 0.5 mg by mouth daily as needed. For anxiety     aspirin 81 MG chewable tablet  Chew 81 mg by mouth daily.     cyanocobalamin 500 MCG tablet  Take 500 mcg by mouth daily.     diltiazem 240 MG 24 hr capsule  Commonly known as:  CARDIZEM CD  Take 240 capsules by mouth daily.     doxycycline 100 MG tablet  Commonly known as:  VIBRA-TABS  Take 1 tablet (100 mg total) by mouth every 12 (twelve) hours.     EPIPEN 2-PAK 0.3 mg/0.3 mL Soaj injection  Generic drug:  EPINEPHrine  Inject 0.3 mg into the skin as needed.     estradiol 2 MG tablet  Commonly known as:  ESTRACE  Take 1 mg by mouth daily.     FLUoxetine 10 MG tablet  Commonly known as:  PROZAC  Take 10 mg by mouth daily.     fluticasone 50 MCG/ACT nasal spray  Commonly known as:  FLONASE  Place 2 sprays into the nose daily.     Fluticasone-Salmeterol 100-50 MCG/DOSE Aepb  Commonly known as:  ADVAIR  Inhale 1 puff into the lungs every 12 (twelve) hours.     furosemide 20 MG tablet  Commonly known as:  LASIX  Take 1 tablet (20 mg total) by mouth daily.     gabapentin 100 MG capsule  Commonly known as:  NEURONTIN  Take 100 mg by mouth daily.     isosorbide mononitrate 30 MG 24 hr tablet  Commonly known as:  IMDUR  Take 1 tablet (30 mg total) by mouth daily.     montelukast 10 MG tablet  Commonly known as:  SINGULAIR  Take 10 mg by mouth at bedtime.     multivitamin with minerals Tabs tablet  Take 1 tablet by mouth daily.     olmesartan 40 MG tablet  Commonly known as:  BENICAR  Take 40 mg by mouth daily.     omeprazole 40 MG capsule  Commonly known as:  PRILOSEC  Take 40 mg by mouth daily.     predniSONE 20 MG tablet  Commonly known as:  DELTASONE  Take 2 tablets (40 mg total) by mouth daily with breakfast.     PROAIR HFA 108  (90 Base) MCG/ACT inhaler  Generic drug:  albuterol  Inhale 1-2 puffs into the lungs every 4 (four) hours as needed.     albuterol (2.5 MG/3ML) 0.083% nebulizer solution  Commonly known as:  PROVENTIL  Take 3 mLs (2.5 mg total) by nebulization every 2 (two) hours as needed for wheezing or shortness of breath.     rosuvastatin 20 MG tablet  Commonly known as:  CRESTOR  Take 1 tablet (20 mg total) by mouth daily at 6 PM.     ticagrelor 90 MG Tabs tablet  Commonly known as:  BRILINTA  Take 1 tablet (90 mg total) by mouth 2 (two) times daily.     triamcinolone cream 0.1 %  Commonly known as:  KENALOG  Apply 1 application topically 2 (two) times daily.     Vitamin D3 2000 units Tabs  Take 1 tablet by mouth daily.       Follow-up Information    Follow up with Richardson Dopp, PA-C On 05/10/2016.   Specialties:  Cardiology, Physician Assistant   Why:  Cardiology Hospital Follow-Up on 05/10/2016 at 8:30AM with Provider and Pharmacist at [facility]. (At [facility]. Will follow-up with Dr. Martinique at the [facility] Location afterwards).    Contact information:   [id number] [place] [address] [phone]       Follow up with Jerlyn Ly, MD. Schedule an appointment as soon as possible for a visit in 1 week.   Specialty:  Internal Medicine   Contact information:   [place] [address] [phone]      Allergies  Allergen Reactions  . Levaquin [Levofloxacin] Anaphylaxis  . Codeine Nausea Only  . Macrobid [Nitrofurantoin]   . Ciprofloxacin Rash  . Penicillins Rash  . Sulfa Antibiotics Rash    Consultations:  Cardiology.    Procedures/Studies: Dg Chest 2 View  05/02/2016  CLINICAL DATA:  Shortness of breath and chest pain EXAM: CHEST  2 VIEW COMPARISON:  05/01/2016 FINDINGS: Cardiac shadow is stable and mildly enlarged. Mild vascular congestion is again seen. No focal interstitial edema is noted. No focal confluent infiltrate is  seen. Minimal bilateral pleural effusions are again noted. IMPRESSION: Mild vascular congestion. Electronically Signed   By: Inez Catalina M.D.   On: 05/02/2016 08:26   Ct Angio Chest Pe W/cm &/or Wo Cm  05/01/2016  CLINICAL DATA:  Asthma attack with difficulty breathing, initial encounter EXAM: CT ANGIOGRAPHY CHEST WITH CONTRAST TECHNIQUE: Multidetector CT imaging of the chest was performed using the standard protocol during bolus administration of intravenous contrast. Multiplanar CT image reconstructions and MIPs were obtained to evaluate the vascular anatomy. CONTRAST:  100 mL Isovue 370. COMPARISON:  Plain film from earlier in the same day. FINDINGS: Mediastinum/Lymph Nodes: No pulmonary emboli or thoracic aortic dissection identified. No masses or pathologically enlarged lymph nodes identified. Coronary calcifications are noted. Stable mild pericardial fluid is noted. Lungs/Pleura: Bilateral pleural effusions right greater than left are noted. Patchy infiltrative changes are noted in the right upper lobe. No parenchymal nodule is noted. Upper abdomen: No acute findings. Musculoskeletal: No chest wall mass or suspicious bone lesions identified. Review of the MIP images confirms the above findings. IMPRESSION: Bilateral pleural effusions. Patchy infiltrative changes are seen in the right upper lobe Electronically Signed   By: Inez Catalina M.D.   On: 05/01/2016 08:03   Dg Chest Port 1 View  05/01/2016  CLINICAL DATA:  Dyspnea today EXAM: PORTABLE CHEST 1 VIEW COMPARISON:  11/29/2013 FINDINGS: There is mild vascular and interstitial prominence which is new from 11/29/2013. This may represent mild congestive heart failure. No airspace consolidation. No large effusion. There is mild cardiomegaly. IMPRESSION: Mild cardiomegaly with vascular and interstitial prominence, likely mild congestive heart failure. Electronically Signed   By: Andreas Newport M.D.   On: 05/01/2016 05:08      Subjective: Denies any  new complaints.   Discharge Exam:  Filed Vitals:   05/04/16 0506 05/04/16 0931  BP: 151/69 135/59  Pulse: 83   Temp: 97.8 F (36.6 C)   Resp: 18    Filed Vitals:   05/03/16 1418 05/03/16 2151 05/04/16 0506 05/04/16 0931  BP: 140/60 144/73 151/69 135/59  Pulse: 78 73 83   Temp: 98.6 F (37 C) 97.8 F (36.6 C) 97.8 F (36.6 C)   TempSrc: Oral Oral Oral   Resp: 16 18 18    Height:      Weight:   105.5 kg (232 lb 9.4 oz)   SpO2:  95% 94%     General: Pt is alert, awake, not in acute distress Cardiovascular: RRR, S1/S2 +, no rubs, no gallops Respiratory: CTA bilaterally, no wheezing, no rhonchi Abdominal: Soft, NT, ND, bowel sounds + Extremities: no edema, no cyanosis    The results of significant diagnostics from this hospitalization (including imaging, microbiology, ancillary and laboratory) are listed below for reference.     Microbiology: Recent Results (from the past 240 hour(s))  Blood culture (routine x 2)     Status: None (Preliminary result)   Collection Time: 05/01/16  5:31 AM  Result Value Ref Range Status   Specimen Description BLOOD BLOOD RIGHT FOREARM  Final   Special Requests IN PEDIATRIC BOTTLE 5CC  Final   Culture NO GROWTH 2 DAYS  Final   Report Status PENDING  Incomplete  Urine culture     Status: Abnormal   Collection Time: 05/01/16  7:19 AM  Result Value Ref Range Status   Specimen Description URINE, CLEAN CATCH  Final   Special Requests NONE  Final   Culture MULTIPLE SPECIES PRESENT, SUGGEST RECOLLECTION (A)  Final   Report Status 05/02/2016 FINAL  Final  Blood culture (routine x 2)     Status: None (Preliminary result)   Collection Time: 05/01/16  7:20 AM  Result Value Ref Range Status   Specimen Description BLOOD RIGHT FOREARM  Final   Special Requests IN PEDIATRIC BOTTLE 3CC  Final   Culture NO GROWTH 2 DAYS  Final   Report Status PENDING  Incomplete     Labs: BNP (last 3 results)  Recent Labs  04/02/16 1154 05/01/16 0531  BNP  35.9 [id number]*   Basic Metabolic Panel:  Recent Labs Lab 05/01/16 0531 05/01/16 0630 05/02/16 0113 05/03/16 0423 05/04/16 0321  NA 137  --  138 137 140  K 4.1  --  4.2 3.8 3.7  CL 107  --  106 103 103  CO2 20*  --  21* 24 26  GLUCOSE 174*  --  198* 147* 137*  BUN 21*  --  22* 35* 37*  CREATININE 1.09*  --  1.14* 1.30* 1.33*  CALCIUM 9.3  --  9.1 9.0 8.7*  MG  --  1.9  --  2.2 2.3  PHOS  --   --   --  4.1 5.1*   Liver Function Tests:  Recent Labs Lab 05/02/16 0113 05/03/16 0423 05/04/16 0321  AST 61*  --   --   ALT 33  --   --   ALKPHOS 69  --   --   BILITOT 0.4  --   --   PROT 6.4*  --   --   ALBUMIN 3.5 3.3* 3.0*   No results for input(s): LIPASE, AMYLASE in the last 168 hours. No results for input(s): AMMONIA in the last 168 hours. CBC:  Recent Labs Lab 05/01/16 0531 05/02/16 0113 05/03/16 0423 05/04/16 0321  WBC 20.3* 15.5* 11.4* 12.4*  NEUTROABS 17.4*  --   --   --   HGB 10.3* 9.5* 9.9* 9.6*  HCT 32.6* 31.0* 31.7* 30.1*  MCV 91.8 91.4 90.6 90.1  PLT 425* 382 411* 372   Cardiac Enzymes:  Recent Labs Lab 05/01/16 0531 05/01/16 1258 05/01/16 1918 05/02/16 0113  TROPONINI 0.67* 0.96* 7.03* 12.09*   BNP: Invalid input(s): POCBNP CBG:  Recent Labs Lab 05/03/16 0559 05/03/16 1314 05/03/16 1702 05/04/16 0020 05/04/16 0618  GLUCAP 158* 217* 147* 148* 146*   D-Dimer No results for input(s): DDIMER in the last 72 hours. Hgb A1c  Recent Labs  05/01/16 1701  HGBA1C 5.8*   Lipid Profile  Recent Labs  05/02/16 1555 05/03/16 0423  CHOL 186 186  HDL 79 77  LDLCALC 84 80  TRIG 116 144  CHOLHDL 2.4 2.4   Thyroid function studies  Recent Labs  05/03/16 0423  TSH 0.382   Anemia work up  Recent Labs  05/03/16 0423  VITAMINB12 625  FOLATE 33.7  FERRITIN 45  TIBC 385  IRON 63   Urinalysis    Component Value Date/Time   COLORURINE YELLOW 05/01/2016 0719   APPEARANCEUR HAZY* 05/01/2016 0719   LABSPEC 1.006 05/01/2016 0719    PHURINE 6.0 05/01/2016 0719   GLUCOSEU NEGATIVE 05/01/2016 0719   HGBUR NEGATIVE 05/01/2016 0719   BILIRUBINUR NEGATIVE 05/01/2016 0719   BILIRUBINUR neg 02/16/2015 1851   KETONESUR NEGATIVE 05/01/2016 0719   PROTEINUR NEGATIVE 05/01/2016 0719   PROTEINUR neg 02/16/2015 1851   UROBILINOGEN 0.2 02/16/2015 1851   UROBILINOGEN 0.2 11/29/2013 1552   NITRITE NEGATIVE 05/01/2016 0719   NITRITE neg 02/16/2015 1851   LEUKOCYTESUR SMALL* 05/01/2016 0719   Sepsis Labs Invalid input(s): PROCALCITONIN,  WBC,  LACTICIDVEN Microbiology Recent Results (from the past 240 hour(s))  Blood culture (routine x 2)     Status: None (Preliminary result)   Collection Time: 05/01/16  5:31 AM  Result Value Ref Range Status   Specimen Description BLOOD BLOOD RIGHT FOREARM  Final   Special Requests IN PEDIATRIC BOTTLE 5CC  Final   Culture NO GROWTH 2 DAYS  Final   Report Status PENDING  Incomplete  Urine culture     Status: Abnormal   Collection Time: 05/01/16  7:19 AM  Result Value Ref Range Status   Specimen Description URINE, CLEAN CATCH  Final   Special Requests NONE  Final   Culture MULTIPLE SPECIES PRESENT, SUGGEST RECOLLECTION (A)  Final   Report Status 05/02/2016 FINAL  Final  Blood culture (routine x 2)     Status: None (Preliminary result)   Collection Time: 05/01/16  7:20 AM  Result Value Ref Range Status   Specimen Description BLOOD RIGHT FOREARM  Final   Special Requests IN PEDIATRIC BOTTLE 3CC  Final   Culture NO GROWTH 2 DAYS  Final   Report Status PENDING  Incomplete     Time coordinating discharge: Over 30 minutes  SIGNED:   Hosie Poisson, MD  [facility] 05/04/2016, 10:52 AM Pager [phone]  If 7PM-7AM, please contact night-coverage [email or web address] Password [email or web address]

## 2016-05-04 NOTE — Progress Notes (Signed)
CARDIAC REHAB PHASE I   PRE:  Rate/Rhythm: 72 SR  BP:  Supine:   Sitting: 158/69  Standing:    SaO2: 92%RA  MODE:  Ambulation: 440 ft   POST:  Rate/Rhythm: 114 ST  86 SR with rest  BP:  Supine:   Sitting: 161/75  Standing:    SaO2: 92%RA 0750-0915 Pt walked 440 ft with slow steady gait. Stopped three times to rest due to SOB. Encouraged pursed lip breathing. MI education completed with pt who voiced understanding. Discussed importance of brilinta with stent. Pt is considering TWILIGHT study. Discussed NTG use, risk factors, ex ed, MI restrictions and CRP 2. Also gave pt CHF booklet and discussed there zones and importance of daily weights and low sodium diet. Gave heart healthy and low sodium diets. Husband is in maintenance program at [facility] so will refer to [place] Phase 2. No CP with walk.    Graylon Good, RN BSN  05/04/2016 9:10 AM

## 2016-05-04 NOTE — Research (Signed)
TWILIGHT Research Study Informed Consent   Subject Name: Donna Williamson  Subject met inclusion and exclusion criteria.  The informed consent form, study requirements and expectations were reviewed with the subject and questions and concerns were addressed prior to the signing of the consent form.  The subject verbalized understanding of the trial requirements.  The subject agreed to participate in the trial and signed the informed consent.  The informed consent was obtained prior to performance of any protocol-specific procedures for the subject.  A copy of the signed informed consent was given to the subject and a copy was placed in the subject's medical record.  Blossom Hoops 05/04/2016, 12:32 PM

## 2016-05-05 ENCOUNTER — Other Ambulatory Visit: Payer: Self-pay | Admitting: *Deleted

## 2016-05-05 MED ORDER — AMBULATORY NON FORMULARY MEDICATION: 81.0000 mg | Freq: Every day | Status: DC

## 2016-05-05 MED ORDER — AMBULATORY NON FORMULARY MEDICATION: 90.0000 mg | Freq: Two times a day (BID) | Status: DC

## 2016-05-05 NOTE — Progress Notes (Signed)
05/05/16 Patient called to ask for script for nitroglycerin.  Called Dr. Karleen Hampshire. Payton Emerald, RN

## 2016-05-05 NOTE — Telephone Encounter (Signed)
Left message to c/b.

## 2016-05-06 MED ORDER — NITROGLYCERIN 0.4 MG SL SUBL: 0.4000 mg | SUBLINGUAL_TABLET | SUBLINGUAL | Status: DC | PRN

## 2016-05-06 NOTE — Telephone Encounter (Signed)
Patient contacted regarding discharge from [facility] on 05/04/16.  Patient understands to follow up with provider Richardson Dopp on 05/10/16 at 8:30 am at [facility] office. Patient understands discharge instructions? Yes Patient understands medications and regiment? yes Patient understands to bring all medications to this visit? yes  Pt states this has been a "real wake up call" and she will be here as scheduled for f/u.  She has an appt today with her PCP.  She needs an RX for [employer] sent into [facility] as this was not given to her at the time of her discharge from the hospital. She had no further questions or concerns but was encouraged to call if any occur prior to her upcoming appt.

## 2016-05-07 LAB — CULTURE, BLOOD (ROUTINE X 2)
CULTURE: NO GROWTH
Culture: NO GROWTH

## 2016-05-09 DIAGNOSIS — I25119 Atherosclerotic heart disease of native coronary artery with unspecified angina pectoris: Secondary | ICD-10-CM | POA: Insufficient documentation

## 2016-05-09 DIAGNOSIS — I2510 Atherosclerotic heart disease of native coronary artery without angina pectoris: Secondary | ICD-10-CM | POA: Insufficient documentation

## 2016-05-09 NOTE — Progress Notes (Deleted)
Cardiology Office Note:    Date:  05/09/2016   ID:  Donna Williamson, Donna Williamson 10-Oct-1946, MRN [id number]  PCP:  Jerlyn Ly, MD  Cardiologist:  Dr. Ena Dawley   Electrophysiologist:  n/a  Referring MD: Crist Infante, MD   Chief Complaint  Patient presents with  . Hospitalization Follow-up    s/p NSTEMI    History of Present Illness:     Donna Williamson is a 70 y.o. female with a hx of HTN, asthma, anxiety. ***  She was admitted 7/2-7/5 with acute diastolic CHF in the setting of NSTEMI.  LHC demonstrated 2 vessel CAD with mod non-obstructive CAD in the LAD and high grade stenosis in the OM1 treated with a DES, mod pulmonary HTN and elevated LVEDP.  EF preserved on Echo.  Creatinine increased some with diuresis.  There was some concern for pneumonia on CXR and she was covered with antibiotics.   She returns for FU.  ***  Past Medical History  Diagnosis Date  . Hypertension   . GERD (gastroesophageal reflux disease)   . Anxiety   . Asthma   . Chest pain     a. 10/2013 Exercise Myoview: Ef 65%, no ischemia.  Marland Kitchen PONV (postoperative nausea and vomiting)   . Mitral valve prolapse   . NSTEMI (non-ST elevated myocardial infarction) ([place]) 05/01/2016  . Pneumonia 05/01/2016    "saw trace of slight pneumonia/CT scan" (05/02/2016)  . Chronic bronchitis ([place])   . Seasonal allergies   . Anemia   . History of blood transfusion 1960s    "related to my hysterectomy"  . Arthritis     "feet, knees, back" (05/02/2016)  . Chronic lower back pain   . Sjogren's syndrome ([place])   . CAD (coronary artery disease)     a. 04/2016: NSTEMI 95% stenosis 1st Mrg (s/p DES)    Past Surgical History  Procedure Laterality Date  . Abdominal hysterectomy  1960s    "partial"  . Total hip arthroplasty Left 2007  . Joint replacement    . Coronary angioplasty with stent placement  05/02/2016    "1 stent"  . Laparoscopic salpingoopherectomy Bilateral ~ 1990  . Colonoscopy w/ biopsies and polypectomy    .  Esophagogastroduodenoscopy    . Cardiac catheterization N/A 05/02/2016    Procedure: Right/Left Heart Cath and Coronary Angiography;  Surgeon: Jettie Booze, MD;  Location: [facility];  Service: Cardiovascular;  Laterality: N/A;  . Cardiac catheterization N/A 05/02/2016    Procedure: Coronary Stent Intervention;  Surgeon: Jettie Booze, MD;  Location: [facility];  Service: Cardiovascular;  Laterality: N/A;    Current Medications: Outpatient Prescriptions Prior to Visit  Medication Sig Dispense Refill  . albuterol (PROVENTIL) (2.5 MG/3ML) 0.083% nebulizer solution Take 3 mLs (2.5 mg total) by nebulization every 2 (two) hours as needed for wheezing or shortness of breath. 75 mL 2  . ALPRAZolam (XANAX) 0.5 MG tablet Take 0.5 mg by mouth daily as needed. For anxiety    . AMBULATORY NON FORMULARY MEDICATION Take 90 mg by mouth 2 (two) times daily. Medication Name: BRILINTA 90 mg BID (TWILIGHT RESEARCH STUDY PROVIDED  DO NOT FILL)    . AMBULATORY NON FORMULARY MEDICATION Take 81 mg by mouth daily. Medication Name: ASPIRIN 81 mg Daily TWILIGHT Research study PROVIDED    . Cholecalciferol (VITAMIN D3) 2000 UNITS TABS Take 1 tablet by mouth daily.    . cyanocobalamin 500 MCG tablet Take 500 mcg by mouth daily.    Marland Kitchen  diltiazem (CARDIZEM CD) 240 MG 24 hr capsule Take 240 capsules by mouth daily.    Marland Kitchen doxycycline (VIBRA-TABS) 100 MG tablet Take 1 tablet (100 mg total) by mouth every 12 (twelve) hours. 6 tablet 0  . EPIPEN 2-PAK 0.3 MG/0.3ML SOAJ injection Inject 0.3 mg into the skin as needed.    Marland Kitchen estradiol (ESTRACE) 2 MG tablet Take 1 mg by mouth daily.     Marland Kitchen FLUoxetine (PROZAC) 10 MG tablet Take 10 mg by mouth daily.    . fluticasone (FLONASE) 50 MCG/ACT nasal spray Place 2 sprays into the nose daily.    . Fluticasone-Salmeterol (ADVAIR) 100-50 MCG/DOSE AEPB Inhale 1 puff into the lungs every 12 (twelve) hours.    . furosemide (LASIX) 20 MG tablet Take 1 tablet (20 mg total) by  mouth daily. 30 tablet 1  . gabapentin (NEURONTIN) 100 MG capsule Take 100 mg by mouth daily.    . isosorbide mononitrate (IMDUR) 30 MG 24 hr tablet Take 1 tablet (30 mg total) by mouth daily. 30 tablet 0  . montelukast (SINGULAIR) 10 MG tablet Take 10 mg by mouth at bedtime.    . Multiple Vitamin (MULTIVITAMIN WITH MINERALS) TABS tablet Take 1 tablet by mouth daily.    . nitroGLYCERIN (NITROSTAT) 0.4 MG SL tablet Place 1 tablet (0.4 mg total) under the tongue every 5 (five) minutes as needed for chest pain. 25 tablet prn  . olmesartan (BENICAR) 40 MG tablet Take 40 mg by mouth daily.    Marland Kitchen omeprazole (PRILOSEC) 40 MG capsule Take 40 mg by mouth daily.    . predniSONE (DELTASONE) 20 MG tablet Take 2 tablets (40 mg total) by mouth daily with breakfast. 4 tablet 0  . PROAIR HFA 108 (90 BASE) MCG/ACT inhaler Inhale 1-2 puffs into the lungs every 4 (four) hours as needed.    . rosuvastatin (CRESTOR) 20 MG tablet Take 1 tablet (20 mg total) by mouth daily at 6 PM. 30 tablet 0  . triamcinolone cream (KENALOG) 0.1 % Apply 1 application topically 2 (two) times daily. 30 g 0   No facility-administered medications prior to visit.      Allergies:   Levaquin; Codeine; Macrobid; Ciprofloxacin; Penicillins; and Sulfa antibiotics   Social History   Social History  . Marital Status: Married    Spouse Name: N/A  . Number of Children: N/A  . Years of Education: N/A   Social History Main Topics  . Smoking status: Never Smoker   . Smokeless tobacco: Never Used  . Alcohol Use: No  . Drug Use: No  . Sexual Activity: Yes   Other Topics Concern  . Not on file   Social History Narrative   Lives in [place] with husband.  Active but does not routinely exercise.     Family History:  The patient's ***family history includes Heart disease in her father; Hypertension in her mother; Ovarian cancer in her sister; Stroke in her mother.   ROS:   Please see the history of present illness.    ROS All other systems  reviewed and are negative.   Physical Exam:    VS:  There were no vitals taken for this visit.   ***Physical Exam  Wt Readings from Last 3 Encounters:  05/04/16 232 lb 9.4 oz (105.5 kg)  04/02/16 238 lb (107.956 kg)  10/17/15 236 lb (107.049 kg)      Studies/Labs Reviewed:     EKG:  EKG is *** ordered today.  The ekg ordered today demonstrates ***  Recent Labs: 05/01/2016: B Natriuretic Peptide 364.0* 05/02/2016: ALT 33 05/03/2016: TSH 0.382 05/04/2016: BUN 37*; Creatinine, Ser 1.33*; Hemoglobin 9.6*; Magnesium 2.3; Platelets 372; Potassium 3.7; Sodium 140   Recent Lipid Panel    Component Value Date/Time   CHOL 186 05/03/2016 0423   TRIG 144 05/03/2016 0423   HDL 77 05/03/2016 0423   CHOLHDL 2.4 05/03/2016 0423   VLDL 29 05/03/2016 0423   LDLCALC 80 05/03/2016 0423    Additional studies/ records that were reviewed today include:   *** LHC 05/02/16 LAD proximal 30%, mid 50%, Ostial D2 70% LCx - OM1 95% thrombotic, 30% RCA luminal irregs LVEDP 32 mmHg - elevated PASP 56 mmHg - mod pulmonary HTN PCI: 2.5 x 16 mm Synergy DES to OM1 Continue dual antiplatelet therapy ideally for a year. She does have anemia but has had bleeding source is ruled out. Brilinta started. She would be a candidate for TWILIGHT study. She will need some diuresis over the next few days.  Echo 05/01/16 EF 55-60%, no RWMA, mild MR  Carotid US 3/15 No sig ICA stenosis bilaterally  Myoview 1/15 IMPRESSION: 1. Negative for exercise-stress induced ischemia. 2. Left ventricular ejection fraction 65%.  ASSESSMENT:     1. NSTEMI (non-ST elevated myocardial infarction) ([place])   2. Coronary artery disease involving native coronary artery of native heart without angina pectoris   3. Chronic diastolic CHF (congestive heart failure) ([place])   4. Essential hypertension   5. Hyperlipidemia     PLAN:     In order of problems listed above:  1. ***   Medication Adjustments/Labs and Tests  Ordered: Current medicines are reviewed at length with the patient today.  Concerns regarding medicines are outlined above.  Medication changes, Labs and Tests ordered today are outlined in the Patient Instructions noted below. There are no Patient Instructions on file for this visit. Signed, Richardson Dopp, PA-C  05/09/2016 9:51 PM    [facility] [place], [place], [place]  [address] Phone: [phone]; Fax: [phone]

## 2016-05-10 ENCOUNTER — Ambulatory Visit (INDEPENDENT_AMBULATORY_CARE_PROVIDER_SITE_OTHER): Payer: Medicare Other | Admitting: Physician Assistant

## 2016-05-10 ENCOUNTER — Ambulatory Visit (INDEPENDENT_AMBULATORY_CARE_PROVIDER_SITE_OTHER): Payer: Medicare Other | Admitting: Pharmacist

## 2016-05-10 ENCOUNTER — Encounter: Payer: Self-pay | Admitting: Physician Assistant

## 2016-05-10 VITALS — BP 110/60 | HR 90 | Ht 65.5 in | Wt 229.4 lb

## 2016-05-10 DIAGNOSIS — I10 Essential (primary) hypertension: Secondary | ICD-10-CM

## 2016-05-10 DIAGNOSIS — E785 Hyperlipidemia, unspecified: Secondary | ICD-10-CM

## 2016-05-10 DIAGNOSIS — I5032 Chronic diastolic (congestive) heart failure: Secondary | ICD-10-CM

## 2016-05-10 DIAGNOSIS — I2510 Atherosclerotic heart disease of native coronary artery without angina pectoris: Secondary | ICD-10-CM

## 2016-05-10 DIAGNOSIS — I214 Non-ST elevation (NSTEMI) myocardial infarction: Secondary | ICD-10-CM

## 2016-05-10 MED ORDER — METOPROLOL TARTRATE 25 MG PO TABS: 12.5000 mg | ORAL_TABLET | Freq: Two times a day (BID) | ORAL | Status: DC

## 2016-05-10 NOTE — Patient Instructions (Addendum)
Medication Instructions:  START METOPROLOL TARTRATE 25 MG TABLET WITH THE DIRECTIONS ON BOTTLE TO STATE : TAKE 1/2 TABLET TWICE DAILY  Labwork: NONE  Testing/Procedures: NONE  Follow-Up: 2-3 MONTHS WITH DR. Burt Knack; OUR OFFICE WILL CALL YOU WITH AN APPOINTMENT  Any Other Special Instructions Will Be Listed Below (If Applicable).  A REFERRAL HAS BEEN PLACED IN EPIC FOR CARDIAC REHAB AT    If you need a refill on your cardiac medications before your next appointment, please call your pharmacy.

## 2016-05-10 NOTE — Progress Notes (Signed)
Cardiology Office Note    Date:  05/10/2016   ID:  Donna Williamson 1946/08/19, MRN [id number]  PCP:  Jerlyn Ly, MD  Cardiologist:  Dr. Meda Coffee seen during 2015 consult/ Dr. Martinique this admission  (Dr. Copper Patient's Husband's Primary Cardiologist - Patient's preference).    Chief Complaint: Hospital follow up s/p  NSTEMI  History of Present Illness:   Donna Williamson is a 70 y.o. female with hx of recently diagnosed CAD and dCHF, HTN, anxiety, asthma, GERD and mitral valve prolapse who presented for hospital follow up.   She was admitted 7/2-7/5 with acute diastolic CHF in the setting of NSTEMI. Peak of troponin was 12.09. LHC demonstrated 2 vessel CAD with mod non-obstructive CAD in the LAD and high grade stenosis in the OM1 treated with a DES, mod pulmonary HTN and elevated LVEDP. EF preserved on Echo. Creatinine increased some with diuresis. total diuresis of 4.8L with 11lb weight loss. There was some concern for pneumonia on CXR and she was covered with antibiotics. Not on BB due to asthma.   Presents today for follow up. The patient lost additional 5 lb since discharge. Dyspnea improved. Still having intermittent LE edema which improves with leg elevation. No orthopnea or PND. Now she is able to sleep flet. No exertional chest pain. No melena or blood in her stool. Seen by PCP 05/06/14 --> kidney function and electrolytes WNL, CBC still high at 18--> plan to repeat BMET and CBC by PCP in 2 weeks.    Past Medical History  Diagnosis Date  . Hypertension   . GERD (gastroesophageal reflux disease)   . Anxiety   . Asthma   . Chest pain     a. 10/2013 Exercise Myoview: Ef 65%, no ischemia.  Marland Kitchen PONV (postoperative nausea and vomiting)   . Mitral valve prolapse   . NSTEMI (non-ST elevated myocardial infarction) ([place]) 05/01/2016  . Pneumonia 05/01/2016    "saw trace of slight pneumonia/CT scan" (05/02/2016)  . Chronic bronchitis ([place])   . Seasonal allergies   . Anemia   .  History of blood transfusion 1960s    "related to my hysterectomy"  . Arthritis     "feet, knees, back" (05/02/2016)  . Chronic lower back pain   . Sjogren's syndrome ([employer])   . CAD (coronary artery disease)     a. 04/2016: NSTEMI 95% stenosis 1st Mrg (s/p DES)    Past Surgical History  Procedure Laterality Date  . Abdominal hysterectomy  1960s    "partial"  . Total hip arthroplasty Left 2007  . Joint replacement    . Coronary angioplasty with stent placement  05/02/2016    "1 stent"  . Laparoscopic salpingoopherectomy Bilateral ~ 1990  . Colonoscopy w/ biopsies and polypectomy    . Esophagogastroduodenoscopy    . Cardiac catheterization N/A 05/02/2016    Procedure: Right/Left Heart Cath and Coronary Angiography;  Surgeon: Jettie Booze, MD;  Location: [facility];  Service: Cardiovascular;  Laterality: N/A;  . Cardiac catheterization N/A 05/02/2016    Procedure: Coronary Stent Intervention;  Surgeon: Jettie Booze, MD;  Location: [facility];  Service: Cardiovascular;  Laterality: N/A;    Current Medications: Prior to Admission medications   Medication Sig Start Date End Date Taking? Authorizing Provider  albuterol (PROVENTIL) (2.5 MG/3ML) 0.083% nebulizer solution Take 3 mLs (2.5 mg total) by nebulization every 2 (two) hours as needed for wheezing or shortness of breath. 05/04/16   Hosie Poisson, MD  ALPRAZolam (XANAX) 0.5 MG tablet Take 0.5 mg by mouth daily as needed. For anxiety    Historical Provider, MD  AMBULATORY NON FORMULARY MEDICATION Take 90 mg by mouth 2 (two) times daily. Medication Name: BRILINTA 90 mg BID (TWILIGHT RESEARCH STUDY PROVIDED  DO NOT FILL) 05/04/16   Burnell Blanks, MD  AMBULATORY NON FORMULARY MEDICATION Take 81 mg by mouth daily. Medication Name: ASPIRIN 81 mg Daily TWILIGHT Research study PROVIDED 05/04/16   Burnell Blanks, MD  Cholecalciferol (VITAMIN D3) 2000 UNITS TABS Take 1 tablet by mouth daily.    Historical Provider,  MD  cyanocobalamin 500 MCG tablet Take 500 mcg by mouth daily.    Historical Provider, MD  diltiazem (CARDIZEM CD) 240 MG 24 hr capsule Take 240 capsules by mouth daily. 11/27/13   Historical Provider, MD  doxycycline (VIBRA-TABS) 100 MG tablet Take 1 tablet (100 mg total) by mouth every 12 (twelve) hours. 05/04/16   Hosie Poisson, MD  EPIPEN 2-PAK 0.3 MG/0.3ML SOAJ injection Inject 0.3 mg into the skin as needed. 11/01/13   Historical Provider, MD  estradiol (ESTRACE) 2 MG tablet Take 1 mg by mouth daily.     Historical Provider, MD  FLUoxetine (PROZAC) 10 MG tablet Take 10 mg by mouth daily.    Historical Provider, MD  fluticasone (FLONASE) 50 MCG/ACT nasal spray Place 2 sprays into the nose daily.    Historical Provider, MD  Fluticasone-Salmeterol (ADVAIR) 100-50 MCG/DOSE AEPB Inhale 1 puff into the lungs every 12 (twelve) hours.    Historical Provider, MD  furosemide (LASIX) 20 MG tablet Take 1 tablet (20 mg total) by mouth daily. 05/04/16   Hosie Poisson, MD  gabapentin (NEURONTIN) 100 MG capsule Take 100 mg by mouth daily.    Historical Provider, MD  isosorbide mononitrate (IMDUR) 30 MG 24 hr tablet Take 1 tablet (30 mg total) by mouth daily. 05/04/16   Hosie Poisson, MD  montelukast (SINGULAIR) 10 MG tablet Take 10 mg by mouth at bedtime.    Historical Provider, MD  Multiple Vitamin (MULTIVITAMIN WITH MINERALS) TABS tablet Take 1 tablet by mouth daily.    Historical Provider, MD  nitroGLYCERIN (NITROSTAT) 0.4 MG SL tablet Place 1 tablet (0.4 mg total) under the tongue every 5 (five) minutes as needed for chest pain. 05/06/16   Dorothy Spark, MD  olmesartan (BENICAR) 40 MG tablet Take 40 mg by mouth daily.    Historical Provider, MD  omeprazole (PRILOSEC) 40 MG capsule Take 40 mg by mouth daily.    Historical Provider, MD  predniSONE (DELTASONE) 20 MG tablet Take 2 tablets (40 mg total) by mouth daily with breakfast. 05/04/16   Hosie Poisson, MD  PROAIR HFA 108 (90 BASE) MCG/ACT inhaler Inhale 1-2 puffs  into the lungs every 4 (four) hours as needed. 11/01/13   Historical Provider, MD  rosuvastatin (CRESTOR) 20 MG tablet Take 1 tablet (20 mg total) by mouth daily at 6 PM. 05/04/16   Hosie Poisson, MD  triamcinolone cream (KENALOG) 0.1 % Apply 1 application topically 2 (two) times daily. 04/02/16   [name], MD    Allergies:   Levaquin; Codeine; Macrobid; Ciprofloxacin; Penicillins; and Sulfa antibiotics   Social History   Social History  . Marital Status: Married    Spouse Name: N/A  . Number of Children: N/A  . Years of Education: N/A   Social History Main Topics  . Smoking status: Never Smoker   . Smokeless tobacco: Never Used  . Alcohol Use: No  .  Drug Use: No  . Sexual Activity: Yes   Other Topics Concern  . None   Social History Narrative   Lives in [place] with husband.  Active but does not routinely exercise.     Family History:  The patient's family history includes Heart disease in her father; Hypertension in her mother; Ovarian cancer in her sister; Stroke in her mother.   ROS:   Please see the history of present illness.    ROS All other systems reviewed and are negative.   PHYSICAL EXAM:   VS:  BP 110/60 mmHg  Pulse 90  Ht 5' 5.5" (1.664 m)  Wt 229 lb 6.4 oz (104.055 kg)  BMI 37.58 kg/m2  SpO2 93%   GEN: Well nourished, well developed, in no acute distress HEENT: normal Neck: no JVD, carotid bruits, or masses Cardiac: RRR; no murmurs, rubs, or gallops. Trace LE edema Respiratory:  clear to auscultation bilaterally, normal work of breathing GI: soft, nontender, nondistended, + BS MS: no deformity or atrophy Skin: warm and dry, no rash Neuro:  Alert and Oriented x 3, Strength and sensation are intact Psych: euthymic mood, full affect  Wt Readings from Last 3 Encounters:  05/10/16 229 lb 6.4 oz (104.055 kg)  05/10/16 229 lb 6.4 oz (104.055 kg)  05/04/16 232 lb 9.4 oz (105.5 kg)      Studies/Labs Reviewed:   EKG:  EKG is ordered today.  The ekg  ordered today demonstrates NSR at rate of 91 bpm  Recent Labs: 05/01/2016: B Natriuretic Peptide 364.0* 05/02/2016: ALT 33 05/03/2016: TSH 0.382 05/04/2016: BUN 37*; Creatinine, Ser 1.33*; Hemoglobin 9.6*; Magnesium 2.3; Platelets 372; Potassium 3.7; Sodium 140   Lipid Panel    Component Value Date/Time   CHOL 186 05/03/2016 0423   TRIG 144 05/03/2016 0423   HDL 77 05/03/2016 0423   CHOLHDL 2.4 05/03/2016 0423   VLDL 29 05/03/2016 0423   LDLCALC 80 05/03/2016 0423    Additional studies/ records that were reviewed today include:   LHC 05/02/16 LAD proximal 30%, mid 50%, Ostial D2 70% LCx - OM1 95% thrombotic, 30% RCA luminal irregs LVEDP 32 mmHg - elevated PASP 56 mmHg - mod pulmonary HTN PCI: 2.5 x 16 mm Synergy DES to OM1 Continue dual antiplatelet therapy ideally for a year. She does have anemia but has had bleeding source is ruled out. Brilinta started. She would be a candidate for TWILIGHT study. She will need some diuresis over the next few days.  Echo 05/01/16 EF 55-60%, no RWMA, mild MR  Carotid US 3/15 No sig ICA stenosis bilaterally  Myoview 1/15 IMPRESSION: 1. Negative for exercise-stress induced ischemia. 2. Left ventricular ejection fraction 65%.   ASSESSMENT & PLAN:    1. CAD s/p PTCA and DES to 1st OM - The patient also has non-obstructive CAD to LAD. No anginal pain. Not on BB due to Asthma. After discussion with pharmacist, we will start low dose metoprolol. HR in 90.  Likely her wheezing was from CHF.   2. Chronic diastolic CHF - Echo during admission showed normal LV function. Breathing improved. Intermittent LE edema, improving. Lungs clear. HF education given. She has lost additional 5lb since discharge on home scale (231.8-->226lb). Weight of 229lb today in clinic. Advised to take extra 20mg  as needed for increased weight or dyspnea. Elevate legs. Try stocking.   3. HTN - BP stable. Continue current regimen. As above.   4. Hyperlipidemia - 05/03/2016:  Cholesterol 186; HDL 77; LDL Cholesterol 80; Triglycerides 144;  VLDL 29  - Intolerant to Lipitor in the past. Continue Crestor. Get lipid panel and LFT during next office visit.   5. CKD, stage 3 - Scr at discharge was 1.33. Seems baseline creatinine of ~1.1. Managed by PCP.   She is under lot of stress at work. Not needed work note. She will let us know if needed.   Medication Adjustments/Labs and Tests Ordered: Current medicines are reviewed at length with the patient today.  Concerns regarding medicines are outlined above.  Medication changes, Labs and Tests ordered today are listed in the Patient Instructions below. Patient Instructions  Medication Instructions:  START METOPROLOL TARTRATE 25 MG TABLET WITH THE DIRECTIONS ON BOTTLE TO STATE : TAKE 1/2 TABLET TWICE DAILY  Labwork: NONE  Testing/Procedures: NONE  Follow-Up: 2-3 MONTHS WITH DR. Burt Knack; OUR OFFICE WILL CALL YOU WITH AN APPOINTMENT  Any Other Special Instructions Will Be Listed Below (If Applicable).  A REFERRAL HAS BEEN PLACED IN EPIC FOR CARDIAC REHAB AT [place]   If you need a refill on your cardiac medications before your next appointment, please call your pharmacy.       Jarrett Soho, [place]  05/10/2016 9:41 AM    [facility] [place], [place], [place]  [address] Phone: [phone]; Fax: [phone]

## 2016-05-10 NOTE — Progress Notes (Signed)
Patient ID: [name]                 DOB: 11-11-1945                      MRN: [id number]    Pharmacy Transitions of Care Visit  HPI: Donna Williamson is a 70 y.o. female discharged on 05/04/16 with primary diagnosis of NSTEMI and diastolic heart failure. PMH also significant for HTN, asthma, GERD, and anxiety. Wheezing during admission thought to be related to HF as opposed to COPD exacerbation - improved with diuresis. Heart cath on 05/02/16 showed 95% stenosis of 1st Mrg - received DES with 0% residual stenosis. Enrolled in TWILIGHT study for Brilinta and ASA. Patient presents to clinic for pharmacy transitions of care medication reconciliation after hospital discharge.  All medications have been reviewed with patient. Pt was discharged on ASA and Brilinta through the TWILIGHT study, ARB, high intensity statin, and prn nitroglycerin. Cardiac referral made. Not discharged on beta blocker d/t wheezing. Pt does not smoke. LDL 80 at time of NSTEMI but not on statin PTA. Discharged on Imdur and furosemide for HF.  Patient does not think her SOB was related to her asthma. Reports that she was using her rescue inhaler more frequently the few weeks before admission and it did not improve her breathing at all. 15 lbs diuresed during hospital admission and pt reported that she felt much better after. Had been noticing weight gain at home and swelling in her legs and abdomen.   Issues/concerns noted are as follows: - Not started on beta blocker post ACS, HR 90 today and breathing improved s/p diuresis in hospital   Past Medical History  Diagnosis Date  . Hypertension   . GERD (gastroesophageal reflux disease)   . Anxiety   . Asthma   . Chest pain     a. 10/2013 Exercise Myoview: Ef 65%, no ischemia.  Marland Kitchen PONV (postoperative nausea and vomiting)   . Mitral valve prolapse   . NSTEMI (non-ST elevated myocardial infarction) ([place]) 05/01/2016  . Pneumonia 05/01/2016    "saw trace of slight pneumonia/CT  scan" (05/02/2016)  . Chronic bronchitis ([place])   . Seasonal allergies   . Anemia   . History of blood transfusion 1960s    "related to my hysterectomy"  . Arthritis     "feet, knees, back" (05/02/2016)  . Chronic lower back pain   . Sjogren's syndrome ([place])   . CAD (coronary artery disease)     a. 04/2016: NSTEMI 95% stenosis 1st Mrg (s/p DES)    Current Outpatient Prescriptions on File Prior to Visit  Medication Sig Dispense Refill  . albuterol (PROVENTIL) (2.5 MG/3ML) 0.083% nebulizer solution Take 3 mLs (2.5 mg total) by nebulization every 2 (two) hours as needed for wheezing or shortness of breath. 75 mL 2  . ALPRAZolam (XANAX) 0.5 MG tablet Take 0.5 mg by mouth daily as needed. For anxiety    . AMBULATORY NON FORMULARY MEDICATION Take 90 mg by mouth 2 (two) times daily. Medication Name: BRILINTA 90 mg BID (TWILIGHT RESEARCH STUDY PROVIDED  DO NOT FILL)    . AMBULATORY NON FORMULARY MEDICATION Take 81 mg by mouth daily. Medication Name: ASPIRIN 81 mg Daily TWILIGHT Research study PROVIDED    . Cholecalciferol (VITAMIN D3) 2000 UNITS TABS Take 1 tablet by mouth daily.    . cyanocobalamin 500 MCG tablet Take 500 mcg by mouth daily.    Marland Kitchen diltiazem (CARDIZEM CD)  240 MG 24 hr capsule Take 240 capsules by mouth daily.    Marland Kitchen doxycycline (VIBRA-TABS) 100 MG tablet Take 1 tablet (100 mg total) by mouth every 12 (twelve) hours. 6 tablet 0  . EPIPEN 2-PAK 0.3 MG/0.3ML SOAJ injection Inject 0.3 mg into the skin as needed.    Marland Kitchen estradiol (ESTRACE) 2 MG tablet Take 1 mg by mouth daily.     Marland Kitchen FLUoxetine (PROZAC) 10 MG tablet Take 10 mg by mouth daily.    . fluticasone (FLONASE) 50 MCG/ACT nasal spray Place 2 sprays into the nose daily.    . Fluticasone-Salmeterol (ADVAIR) 100-50 MCG/DOSE AEPB Inhale 1 puff into the lungs every 12 (twelve) hours.    . furosemide (LASIX) 20 MG tablet Take 1 tablet (20 mg total) by mouth daily. 30 tablet 1  . gabapentin (NEURONTIN) 100 MG capsule Take 100 mg by mouth  daily.    . isosorbide mononitrate (IMDUR) 30 MG 24 hr tablet Take 1 tablet (30 mg total) by mouth daily. 30 tablet 0  . montelukast (SINGULAIR) 10 MG tablet Take 10 mg by mouth at bedtime.    . Multiple Vitamin (MULTIVITAMIN WITH MINERALS) TABS tablet Take 1 tablet by mouth daily.    . nitroGLYCERIN (NITROSTAT) 0.4 MG SL tablet Place 1 tablet (0.4 mg total) under the tongue every 5 (five) minutes as needed for chest pain. 25 tablet prn  . olmesartan (BENICAR) 40 MG tablet Take 40 mg by mouth daily.    Marland Kitchen omeprazole (PRILOSEC) 40 MG capsule Take 40 mg by mouth daily.    . predniSONE (DELTASONE) 20 MG tablet Take 2 tablets (40 mg total) by mouth daily with breakfast. 4 tablet 0  . PROAIR HFA 108 (90 BASE) MCG/ACT inhaler Inhale 1-2 puffs into the lungs every 4 (four) hours as needed.    . rosuvastatin (CRESTOR) 20 MG tablet Take 1 tablet (20 mg total) by mouth daily at 6 PM. 30 tablet 0  . triamcinolone cream (KENALOG) 0.1 % Apply 1 application topically 2 (two) times daily. 30 g 0   No current facility-administered medications on file prior to visit.    Allergies  Allergen Reactions  . Levaquin [Levofloxacin] Anaphylaxis  . Codeine Nausea Only  . Macrobid [Nitrofurantoin]   . Ciprofloxacin Rash  . Penicillins Rash  . Sulfa Antibiotics Rash    Assessment/Plan:  1. Post ACS/HF med rec - Indicated for beta blocker therapy post ACS event. Discussed with PA. Was not started inpatient d/t asthma however respiratory symptoms seemed to be more related to HF fluid overload. Cardiac selective beta blocker at low dose should not stimulate pulmonary beta receptors. Will start metoprolol tartrate 12.5mg  BID. Discussed monitoring BP as well as daily weights at home. Advised pt to call clinic with any significant weight gain.    Megan E. Supple, PharmD, [place] [id number], [place], [place] [address] Phone: [phone]; Fax: [phone] 05/10/2016 8:51 AM

## 2016-05-12 ENCOUNTER — Telehealth: Payer: Self-pay | Admitting: Physician Assistant

## 2016-05-12 MED ORDER — OLMESARTAN MEDOXOMIL 20 MG PO TABS: 20.0000 mg | ORAL_TABLET | Freq: Every day | ORAL | Status: DC

## 2016-05-12 NOTE — Telephone Encounter (Signed)
New message       Pt c/o BP issue: STAT if pt c/o blurred vision, one-sided weakness or slurred speech  1. What are your last 5 BP readings? 94/40  2. Are you having any other symptoms (ex. Dizziness, headache, blurred vision, passed out)? lightheadedness  3. What is your BP issue?  Pt was seen on 05-10-16.  She is on metoprolol.  She states her bp is low.  Please advise

## 2016-05-12 NOTE — Telephone Encounter (Signed)
Spoke with pt and she states that around noon today she noticed when she stood up that she would get lightheaded but it resolved quickly. BP's today were 94/40, 92/60, 96/50.  Had pt check it while on the phone and it was 97/57, HR 70. Pt started on Metoprolol Tartrate 12.5mg  BID at appt 7/11.  Started medication  7/12 and did not have problems the first day. Pt takes Lasix first thing in the morning and then waits a little bit and takes Imdur, Benicar, Metoprolol and Diltiazem.  Advised pt I would speak with DOD and then call her back. Spoke with Dr. Caryl Comes and he said to have pt decrease her Benicar from 40mg  QD to 20mg  QHS. Spoke with pt and informed her of recommendations. Advised pt to let us know if she continues to have problems with the lower dose of Benicar. Pt verbalized understanding and was in agreement with this plan.

## 2016-05-30 ENCOUNTER — Other Ambulatory Visit: Payer: Self-pay

## 2016-05-30 ENCOUNTER — Telehealth: Payer: Self-pay

## 2016-05-30 MED ORDER — ISOSORBIDE MONONITRATE ER 30 MG PO TB24
30.0000 mg | ORAL_TABLET | Freq: Every day | ORAL | 0 refills | Status: DC
Start: 2016-05-30 — End: 2016-08-08

## 2016-05-30 MED ORDER — ROSUVASTATIN CALCIUM 20 MG PO TABS: 20.0000 mg | ORAL_TABLET | Freq: Every day | ORAL | 0 refills | Status: AC

## 2016-05-30 NOTE — Telephone Encounter (Signed)
Michae Kava, CMA      05/30/16 11:03 AM  Note    Gwyn Mehring,  Ok to refill Imdur and Crestor. Thank you

## 2016-05-30 NOTE — Telephone Encounter (Signed)
Candice,  Ok to refill Imdur and Crestor. Thank you

## 2016-06-01 ENCOUNTER — Telehealth: Payer: Self-pay | Admitting: *Deleted

## 2016-06-01 ENCOUNTER — Encounter: Payer: Self-pay | Admitting: *Deleted

## 2016-06-01 DIAGNOSIS — Z006 Encounter for examination for normal comparison and control in clinical research program: Secondary | ICD-10-CM

## 2016-06-01 NOTE — Progress Notes (Signed)
TWILIGHT Research study 1 month telephone follow up completed. Patient denies any adverse events states she has been compliant with medication. Next research appointment scheduled for 10/6 @ 1 pm before her cardiac rehab. I will meet her in cardiac rehab. Questions encouraged and answered.

## 2016-06-01 NOTE — Telephone Encounter (Signed)
Left message for patient to return call to research office for TWILIGHT 1 month telephone follow.

## 2016-06-02 ENCOUNTER — Encounter (HOSPITAL_COMMUNITY): Payer: Self-pay

## 2016-06-02 ENCOUNTER — Encounter (HOSPITAL_COMMUNITY)
Admission: RE | Admit: 2016-06-02 | Discharge: 2016-06-02 | Disposition: A | Discharge status: Home / Self Care | Payer: Medicare Other | Source: Ambulatory Visit | Attending: Cardiovascular Disease | Admitting: Cardiovascular Disease

## 2016-06-02 VITALS — BP 100/58 | HR 80 | Ht 65.5 in | Wt 224.9 lb

## 2016-06-02 DIAGNOSIS — Z955 Presence of coronary angioplasty implant and graft: Secondary | ICD-10-CM | POA: Diagnosis present

## 2016-06-02 DIAGNOSIS — I214 Non-ST elevation (NSTEMI) myocardial infarction: Secondary | ICD-10-CM | POA: Diagnosis not present

## 2016-06-02 DIAGNOSIS — I2510 Atherosclerotic heart disease of native coronary artery without angina pectoris: Secondary | ICD-10-CM | POA: Diagnosis not present

## 2016-06-02 NOTE — Progress Notes (Signed)
Cardiac Rehab Medication Review by a Pharmacist  Does the patient  feel that his/her medications are working for him/her?  yes  Has the patient been experiencing any side effects to the medications prescribed?  no  Does the patient measure his/her own blood pressure or blood glucose at home?  yes   Does the patient have any problems obtaining medications due to transportation or finances?   no  Understanding of regimen: good Understanding of indications: good Potential of compliance: good    Pharmacist comments: EB is a 70 yo female who presents to cardiac rehab with her husband. Patient expresses good understanding of medications and checks her blood pressure at home.    [place] Resident 06/02/2016 8:32 AM

## 2016-06-02 NOTE — Progress Notes (Signed)
Cardiac Individual Treatment Plan  Patient Details  Name: Donna Williamson MRN: [id number] Date of Birth: June 28, 1946 Referring Provider:   Flowsheet Row CARDIAC REHAB PHASE II ORIENTATION from 06/02/2016 in [place]  Referring Provider  Sherren Mocha, MD      Initial Encounter Date:  [place] PHASE II ORIENTATION from 06/02/2016 in [place]  Date  06/02/16  Referring Provider  Sherren Mocha, MD      Visit Diagnosis: 05/01/16 NSTEMI (non-ST elevated myocardial infarction) [facility])  05/02/16 Stented coronary artery  Patient's Home Medications on Admission:  Current Outpatient Prescriptions:  .  ALPRAZolam (XANAX) 0.5 MG tablet, Take 0.5 mg by mouth daily as needed. For anxiety, Disp: , Rfl:  .  AMBULATORY NON FORMULARY MEDICATION, Take 90 mg by mouth 2 (two) times daily. Medication Name: BRILINTA 90 mg BID (TWILIGHT RESEARCH STUDY PROVIDED  DO NOT FILL), Disp: , Rfl:  .  AMBULATORY NON FORMULARY MEDICATION, Take 81 mg by mouth daily. Medication Name: ASPIRIN 81 mg Daily TWILIGHT Research study PROVIDED, Disp: , Rfl:  .  Cholecalciferol (VITAMIN D3) 2000 UNITS TABS, Take 1 tablet by mouth daily., Disp: , Rfl:  .  cyanocobalamin 500 MCG tablet, Take 500 mcg by mouth daily., Disp: , Rfl:  .  diltiazem (CARDIZEM CD) 240 MG 24 hr capsule, Take 240 capsules by mouth daily., Disp: , Rfl:  .  EPIPEN 2-PAK 0.3 MG/0.3ML SOAJ injection, Inject 0.3 mg into the skin as needed., Disp: , Rfl:  .  estradiol (ESTRACE) 1 MG tablet, Take 1 mg by mouth daily., Disp: , Rfl:  .  FLUoxetine (PROZAC) 10 MG tablet, Take 10 mg by mouth daily., Disp: , Rfl:  .  fluticasone (FLONASE) 50 MCG/ACT nasal spray, Place 2 sprays into the nose daily., Disp: , Rfl:  .  Fluticasone-Salmeterol (ADVAIR) 100-50 MCG/DOSE AEPB, Inhale 1 puff into the lungs every 12 (twelve) hours., Disp: , Rfl:  .  furosemide (LASIX) 20 MG tablet, Take 1 tablet  (20 mg total) by mouth daily. (Patient taking differently: Take 20 mg by mouth every other day. ), Disp: 30 tablet, Rfl: 1 .  gabapentin (NEURONTIN) 100 MG capsule, Take 100 mg by mouth daily. Supposed to go up to 300 mg, Disp: , Rfl:  .  isosorbide mononitrate (IMDUR) 30 MG 24 hr tablet, Take 1 tablet (30 mg total) by mouth daily., Disp: 30 tablet, Rfl: 0 .  metoprolol tartrate (LOPRESSOR) 25 MG tablet, Take 0.5 tablets (12.5 mg total) by mouth 2 (two) times daily., Disp: 30 tablet, Rfl: 11 .  montelukast (SINGULAIR) 10 MG tablet, Take 10 mg by mouth at bedtime., Disp: , Rfl:  .  Multiple Vitamin (MULTIVITAMIN WITH MINERALS) TABS tablet, Take 1 tablet by mouth daily., Disp: , Rfl:  .  mupirocin ointment (BACTROBAN) 2 %, Apply 1 application topically daily as needed (skin irritation). , Disp: , Rfl: 0 .  nitroGLYCERIN (NITROSTAT) 0.4 MG SL tablet, Place 1 tablet (0.4 mg total) under the tongue every 5 (five) minutes as needed for chest pain., Disp: 25 tablet, Rfl: prn .  olmesartan (BENICAR) 20 MG tablet, Take 1 tablet (20 mg total) by mouth daily., Disp: 30 tablet, Rfl: 11 .  omeprazole (PRILOSEC) 40 MG capsule, Take 40 mg by mouth daily., Disp: , Rfl:  .  PROAIR HFA 108 (90 BASE) MCG/ACT inhaler, Inhale 1-2 puffs into the lungs every 4 (four) hours as needed., Disp: , Rfl:  .  rosuvastatin (CRESTOR) 20 MG tablet, Take 1 tablet (20 mg total) by mouth daily at 6 PM., Disp: 30 tablet, Rfl: 0 .  triamcinolone cream (KENALOG) 0.1 %, Apply 1 application topically 2 (two) times daily., Disp: 30 g, Rfl: 0 .  albuterol (PROVENTIL) (2.5 MG/3ML) 0.083% nebulizer solution, Take 3 mLs (2.5 mg total) by nebulization every 2 (two) hours as needed for wheezing or shortness of breath. (Patient not taking: Reported on 06/02/2016), Disp: 75 mL, Rfl: 2  Past Medical History: Past Medical History:  Diagnosis Date  . Anemia   . Anxiety   . Arthritis    "feet, knees, back" (05/02/2016)  . Asthma   . CAD (coronary  artery disease)    a. 04/2016: NSTEMI 95% stenosis 1st Mrg (s/p DES)  . Chest pain    a. 10/2013 Exercise Myoview: Ef 65%, no ischemia.  . Chronic bronchitis ([place])   . Chronic lower back pain   . GERD (gastroesophageal reflux disease)   . History of blood transfusion 1960s   "related to my hysterectomy"  . Hypertension   . Mitral valve prolapse   . NSTEMI (non-ST elevated myocardial infarction) ([place]) 05/01/2016  . Pneumonia 05/01/2016   "saw trace of slight pneumonia/CT scan" (05/02/2016)  . PONV (postoperative nausea and vomiting)   . Seasonal allergies   . Sjogren's syndrome ([place])     Tobacco Use: History  Smoking Status  . Never Smoker  Smokeless Tobacco  . Never Used    Labs: Recent Review Flowsheet Data    Labs for ITP Cardiac and Pulmonary Rehab Latest Ref Rng & Units 05/01/2016 05/02/2016 05/02/2016 05/02/2016 05/03/2016   Cholestrol 0 - 200 mg/dL - - - 186 186   LDLCALC 0 - 99 mg/dL - - - 84 80   HDL >40 mg/dL - - - 79 77   Trlycerides <150 mg/dL - - - 116 144   Hemoglobin A1c 4.8 - 5.6 % 5.8(H) - - - -   PHART 7.350 - 7.450 - - 7.414 - -   PCO2ART 35.0 - 45.0 mmHg - - 35.6 - -   HCO3 20.0 - 24.0 mEq/L - 24.7(H) 22.8 - -   TCO2 0 - 100 mmol/L - 26 24 - -   ACIDBASEDEF 0.0 - 2.0 mmol/L - - 1.0 - -   O2SAT % - 66.0 92.0 - -      Capillary Blood Glucose: Lab Results  Component Value Date   GLUCAP 133 (H) 05/04/2016   GLUCAP 146 (H) 05/04/2016   GLUCAP 148 (H) 05/04/2016   GLUCAP 147 (H) 05/03/2016   GLUCAP 217 (H) 05/03/2016     Exercise Target Goals: Date: 06/02/16  Exercise Program Goal: Individual exercise prescription set with THRR, safety & activity barriers. Participant demonstrates ability to understand and report RPE using BORG scale, to self-measure pulse accurately, and to acknowledge the importance of the exercise prescription.  Exercise Prescription Goal: Starting with aerobic activity 30 plus minutes a day, 3 days per week for initial exercise  prescription. Provide home exercise prescription and guidelines that participant acknowledges understanding prior to discharge.  Activity Barriers & Risk Stratification:     Activity Barriers & Cardiac Risk Stratification - 06/02/16 0903      Activity Barriers & Cardiac Risk Stratification   Activity Barriers Back Problems;Left Hip Replacement   Cardiac Risk Stratification High      6 Minute Walk:     6 Minute Walk    Row Name 06/02/16 1639  6 Minute Walk   Phase Initial     Distance 1007 feet     Walk Time 6 minutes     # of Rest Breaks 0     MPH 1.91     METS 1.5     RPE 13     VO2 Peak 5.26     Symptoms No     Resting HR 80 bpm     Resting BP 100/58     Max Ex. HR 94 bpm     Max Ex. BP 106/58     2 Minute Post BP 100/62        Initial Exercise Prescription:     Initial Exercise Prescription - 06/02/16 1600      Date of Initial Exercise RX and Referring Provider   Date 06/02/16   Referring Provider Sherren Mocha, MD     Recumbant Bike   Level 1.5   Minutes 10   METs 1.5     NuStep   Level 2   Minutes 10   METs 1.6     Track   Laps 7   Minutes 10   METs 2.23     Prescription Details   Frequency (times per week) 3   Duration Progress to 30 minutes of continuous aerobic without signs/symptoms of physical distress     Intensity   THRR 40-80% of Max Heartrate 59-118   Ratings of Perceived Exertion 11-13   Perceived Dyspnea 0-4     Progression   Progression Continue to progress workloads to maintain intensity without signs/symptoms of physical distress.     Resistance Training   Training Prescription Yes   Weight 1lb   Reps 10-12      Perform Capillary Blood Glucose checks as needed.  Exercise Prescription Changes:   Exercise Comments:   Discharge Exercise Prescription (Final Exercise Prescription Changes):   Nutrition:  Target Goals: Understanding of nutrition guidelines, daily intake of sodium 1500mg , cholesterol  200mg , calories 30% from fat and 7% or less from saturated fats, daily to have 5 or more servings of fruits and vegetables.  Biometrics:     Pre Biometrics - 06/02/16 1643      Pre Biometrics   Height 5' 5.5" (1.664 m)   Weight 224 lb 13.9 oz (102 kg)   Waist Circumference 41.5 inches   Hip Circumference 50 inches   Waist to Hip Ratio 0.83 %   BMI (Calculated) 36.9   Triceps Skinfold 45 mm   % Body Fat 48.7 %   Grip Strength 25 kg   Flexibility 8 in   Single Leg Stand 1.27 seconds       Nutrition Therapy Plan and Nutrition Goals:   Nutrition Discharge: Nutrition Scores:   Nutrition Goals Re-Evaluation:   Psychosocial: Target Goals: Acknowledge presence or absence of depression, maximize coping skills, provide positive support system. Participant is able to verbalize types and ability to use techniques and skills needed for reducing stress and depression.  Initial Review & Psychosocial Screening:   Quality of Life Scores:     Quality of Life - 06/02/16 1647      Quality of Life Scores   Health/Function Pre 26.23 %   Socioeconomic Pre 30 %   Psych/Spiritual Pre 30 %   Family Pre 27 %   GLOBAL Pre 27.92 %      PHQ-9: Recent Review Flowsheet Data    Depression screen [facility] 2/9 04/02/2016 10/17/2015 08/23/2015   Decreased Interest 0 0 0  Down, Depressed, Hopeless 0 0 0   PHQ - 2 Score 0 0 0      Psychosocial Evaluation and Intervention:   Psychosocial Re-Evaluation:   Vocational Rehabilitation: Provide vocational rehab assistance to qualifying candidates.   Vocational Rehab Evaluation & Intervention:   Education: Education Goals: Education classes will be provided on a weekly basis, covering required topics. Participant will state understanding/return demonstration of topics presented.  Learning Barriers/Preferences:     Learning Barriers/Preferences - 06/02/16 0904      Learning Barriers/Preferences   Learning Barriers Sight   Learning  Preferences Skilled Demonstration      Education Topics: Count Your Pulse:  -Group instruction provided by verbal instruction, demonstration, patient participation and written materials to support subject.  Instructors address importance of being able to find your pulse and how to count your pulse when at home without a heart monitor.  Patients get hands on experience counting their pulse with staff help and individually.   Heart Attack, Angina, and Risk Factor Modification:  -Group instruction provided by verbal instruction, video, and written materials to support subject.  Instructors address signs and symptoms of angina and heart attacks.    Also discuss risk factors for heart disease and how to make changes to improve heart health risk factors.   Functional Fitness:  -Group instruction provided by verbal instruction, demonstration, patient participation, and written materials to support subject.  Instructors address safety measures for doing things around the house.  Discuss how to get up and down off the floor, how to pick things up properly, how to safely get out of a chair without assistance, and balance training.   Meditation and Mindfulness:  -Group instruction provided by verbal instruction, patient participation, and written materials to support subject.  Instructor addresses importance of mindfulness and meditation practice to help reduce stress and improve awareness.  Instructor also leads participants through a meditation exercise.    Stretching for Flexibility and Mobility:  -Group instruction provided by verbal instruction, patient participation, and written materials to support subject.  Instructors lead participants through series of stretches that are designed to increase flexibility thus improving mobility.  These stretches are additional exercise for major muscle groups that are typically performed during regular warm up and cool down.   Hands Only CPR Anytime:  -Group  instruction provided by verbal instruction, video, patient participation and written materials to support subject.  Instructors co-teach with AHA video for hands only CPR.  Participants get hands on experience with mannequins.   Nutrition I class: Heart Healthy Eating:  -Group instruction provided by PowerPoint slides, verbal discussion, and written materials to support subject matter. The instructor gives an explanation and review of the Therapeutic Lifestyle Changes diet recommendations, which includes a discussion on lipid goals, dietary fat, sodium, fiber, plant stanol/sterol esters, sugar, and the components of a well-balanced, healthy diet.   Nutrition II class: Lifestyle Skills:  -Group instruction provided by PowerPoint slides, verbal discussion, and written materials to support subject matter. The instructor gives an explanation and review of label reading, grocery shopping for heart health, heart healthy recipe modifications, and ways to make healthier choices when eating out.   Diabetes Question & Answer:  -Group instruction provided by PowerPoint slides, verbal discussion, and written materials to support subject matter. The instructor gives an explanation and review of diabetes co-morbidities, pre- and post-prandial blood glucose goals, pre-exercise blood glucose goals, signs, symptoms, and treatment of hypoglycemia and hyperglycemia, and foot care basics.   Diabetes Blitz:  -  Group instruction provided by PowerPoint slides, verbal discussion, and written materials to support subject matter. The instructor gives an explanation and review of the physiology behind type 1 and type 2 diabetes, diabetes medications and rational behind using different medications, pre- and post-prandial blood glucose recommendations and Hemoglobin A1c goals, diabetes diet, and exercise including blood glucose guidelines for exercising safely.    Portion Distortion:  -Group instruction provided by PowerPoint  slides, verbal discussion, written materials, and food models to support subject matter. The instructor gives an explanation of serving size versus portion size, changes in portions sizes over the last 20 years, and what consists of a serving from each food group.   Stress Management:  -Group instruction provided by verbal instruction, video, and written materials to support subject matter.  Instructors review role of stress in heart disease and how to cope with stress positively.     Exercising on Your Own:  -Group instruction provided by verbal instruction, power point, and written materials to support subject.  Instructors discuss benefits of exercise, components of exercise, frequency and intensity of exercise, and end points for exercise.  Also discuss use of nitroglycerin and activating EMS.  Review options of places to exercise outside of rehab.  Review guidelines for sex with heart disease.   Cardiac Drugs I:  -Group instruction provided by verbal instruction and written materials to support subject.  Instructor reviews cardiac drug classes: antiplatelets, anticoagulants, beta blockers, and statins.  Instructor discusses reasons, side effects, and lifestyle considerations for each drug class.   Cardiac Drugs II:  -Group instruction provided by verbal instruction and written materials to support subject.  Instructor reviews cardiac drug classes: angiotensin converting enzyme inhibitors (ACE-I), angiotensin II receptor blockers (ARBs), nitrates, and calcium channel blockers.  Instructor discusses reasons, side effects, and lifestyle considerations for each drug class.   Anatomy and Physiology of the Circulatory System:  -Group instruction provided by verbal instruction, video, and written materials to support subject.  Reviews functional anatomy of heart, how it relates to various diagnoses, and what role the heart plays in the overall system.   Knowledge Questionnaire Score:      Knowledge Questionnaire Score - 06/02/16 1630      Knowledge Questionnaire Score   Pre Score 19/24      Core Components/Risk Factors/Patient Goals at Admission:     Personal Goals and Risk Factors at Admission - 06/02/16 0905      Core Components/Risk Factors/Patient Goals on Admission    Weight Management Obesity;Yes   Intervention Weight Management: Provide education and appropriate resources to help participant work on and attain dietary goals.;Weight Management: Develop a combined nutrition and exercise program designed to reach desired caloric intake, while maintaining appropriate intake of nutrient and fiber, sodium and fats, and appropriate energy expenditure required for the weight goal.;Weight Management/Obesity: Establish reasonable short term and long term weight goals.;Obesity: Provide education and appropriate resources to help participant work on and attain dietary goals.   Expected Outcomes Short Term: Continue to assess and modify interventions until short term weight is achieved;Weight Maintenance: Understanding of the daily nutrition guidelines, which includes 25-35% calories from fat, 7% or less cal from saturated fats, less than 200mg  cholesterol, less than 1.5gm of sodium, & 5 or more servings of fruits and vegetables daily;Weight Loss: Understanding of general recommendations for a balanced deficit meal plan, which promotes 1-2 lb weight loss per week and includes a negative energy balance of [phone] kcal/d;Understanding recommendations for meals to include 15-35% energy as  protein, 25-35% energy from fat, 35-60% energy from carbohydrates, less than 200mg  of dietary cholesterol, 20-35 gm of total fiber daily;Understanding of distribution of calorie intake throughout the day with the consumption of 4-5 meals/snacks   Sedentary Yes   Intervention Provide advice, education, support and counseling about physical activity/exercise needs.;Develop an individualized exercise  prescription for aerobic and resistive training based on initial evaluation findings, risk stratification, comorbidities and participant's personal goals.   Expected Outcomes Achievement of increased cardiorespiratory fitness and enhanced flexibility, muscular endurance and strength shown through measurements of functional capacity and personal statement of participant.   Increase Strength and Stamina Yes   Intervention Provide advice, education, support and counseling about physical activity/exercise needs.;Develop an individualized exercise prescription for aerobic and resistive training based on initial evaluation findings, risk stratification, comorbidities and participant's personal goals.   Expected Outcomes Achievement of increased cardiorespiratory fitness and enhanced flexibility, muscular endurance and strength shown through measurements of functional capacity and personal statement of participant.   Improve shortness of breath with ADL's Yes   Intervention Provide education, individualized exercise plan and daily activity instruction to help decrease symptoms of SOB with activities of daily living.   Expected Outcomes Short Term: Achieves a reduction of symptoms when performing activities of daily living.   Hypertension Yes   Intervention Provide education on lifestyle modifcations including regular physical activity/exercise, weight management, moderate sodium restriction and increased consumption of fresh fruit, vegetables, and low fat dairy, alcohol moderation, and smoking cessation.;Monitor prescription use compliance.   Expected Outcomes Short Term: Continued assessment and intervention until BP is < 140/39mm HG in hypertensive participants. < 130/40mm HG in hypertensive participants with diabetes, heart failure or chronic kidney disease.;Long Term: Maintenance of blood pressure at goal levels.   Lipids Yes   Intervention Provide education and support for participant on nutrition &  aerobic/resistive exercise along with prescribed medications to achieve LDL 70mg , HDL >40mg .   Expected Outcomes Short Term: Participant states understanding of desired cholesterol values and is compliant with medications prescribed. Participant is following exercise prescription and nutrition guidelines.;Long Term: Cholesterol controlled with medications as prescribed, with individualized exercise RX and with personalized nutrition plan. Value goals: LDL < 70mg , HDL > 40 mg.   Personal Goal Other Yes   Personal Goal short: feel better, improve SOB and lose wt (5-10)  long: lose wt (50lbs), stronger heart and increase confidence   Intervention Provide nutrition education and exercise programming to improve cardiovascular fitness and confidence   Expected Outcomes Pt wil have improved SOB with activities, increased confidence and feel better      Core Components/Risk Factors/Patient Goals Review:    Core Components/Risk Factors/Patient Goals at Discharge (Final Review):    ITP Comments:     ITP Comments    Row Name 06/02/16 0859           ITP Comments Dr. Fransico Him, Medical Director          Comments:  Pt in today for cardiac rehab orientation from 0800-1130.  As a part of the orientation, pt completed 6 minute walk test.  Pt tolerated well with some fatigue toward the end. Pt admits she has done very little walking and didn't exercise prior to her cardiac event.  Monitor showed Sr with no ectopy noted. Pt is eager to begin the exercise process and will accompany her husband who presently is in the maintenance program. Maurice Small RN, BSN

## 2016-06-06 ENCOUNTER — Encounter (HOSPITAL_COMMUNITY)
Admission: RE | Admit: 2016-06-06 | Discharge: 2016-06-06 | Disposition: A | Discharge status: Home / Self Care | Payer: Medicare Other | Source: Ambulatory Visit | Attending: Cardiovascular Disease | Admitting: Cardiovascular Disease

## 2016-06-06 ENCOUNTER — Encounter (HOSPITAL_COMMUNITY): Payer: Self-pay

## 2016-06-06 DIAGNOSIS — I214 Non-ST elevation (NSTEMI) myocardial infarction: Secondary | ICD-10-CM

## 2016-06-06 DIAGNOSIS — Z955 Presence of coronary angioplasty implant and graft: Secondary | ICD-10-CM

## 2016-06-06 NOTE — Progress Notes (Signed)
Daily Session Note  Patient Details  Name: Donna Williamson MRN: [id number] Date of Birth: [birth year] Referring Provider:   Flowsheet Row CARDIAC REHAB PHASE II ORIENTATION from 06/02/2016 in [place]  Referring Provider  Sherren Mocha, MD      Encounter Date: 06/06/2016  Check In:     Session Check In - 06/06/16 1449      Check-In   Location [facility]   Staff Present Andi Hence, RN, BSN;Amber Fair, MS, ACSM RCEP, Exercise Physiologist;Maria Whitaker, RN, BSN;Carlette Carlton, RN, BSN   Supervising physician immediately available to respond to emergencies [facility] immediately available   Physician(s) Dr Nehemiah Settle   Medication changes reported     No   Fall or balance concerns reported    No   Warm-up and Cool-down Performed as group-led Location manager Performed Yes   VAD Patient? No     Pain Assessment   Currently in Pain? No/denies      Capillary Blood Glucose: No results found for this or any previous visit (from the past 24 hour(s)).   Goals Met:  Exercise tolerated well  Goals Unmet:  Not Applicable  Comments: Pt started cardiac rehab today.  Pt tolerated light exercise without difficulty. VSS, telemetry-sinus rhythm, asymptomatic.  Medication list reconciled. Pt denies barriers to medicaiton compliance.  PSYCHOSOCIAL ASSESSMENT:  PHQ-0 Pt exhibits positive coping skills, hopeful outlook with supportive family. No psychosocial needs identified at this time, no psychosocial interventions necessary.    Pt enjoys reading, watching western and Geographical information systems officer.  Pt goals are to increase strength and stamina, and decrease dyspnea.  Pt oriented to exercise equipment and routine.    Understanding verbalized.   Dr. Fransico Him is Medical Director for Cardiac Rehab at [facility].

## 2016-06-08 ENCOUNTER — Encounter (HOSPITAL_COMMUNITY)
Admission: RE | Admit: 2016-06-08 | Discharge: 2016-06-08 | Disposition: A | Discharge status: Home / Self Care | Payer: Medicare Other | Source: Ambulatory Visit | Attending: Cardiovascular Disease | Admitting: Cardiovascular Disease

## 2016-06-08 DIAGNOSIS — I214 Non-ST elevation (NSTEMI) myocardial infarction: Secondary | ICD-10-CM

## 2016-06-08 DIAGNOSIS — Z955 Presence of coronary angioplasty implant and graft: Secondary | ICD-10-CM

## 2016-06-08 NOTE — Progress Notes (Signed)
Pt hypotensive post exercise at cardiac rehab. BP:  90/60  Asymptomatic.  Pt given gatorade with recheck BP:  128/54, HR- 78.  Pre-exercise BP:  118/52 with peak exercise 138/80, HR-95.  Pt tolerated light exercise with short rest break while walking track.    Dr. Burt Knack made aware. Pt had eaten 1/2 egg mcmuffin prior to coming to cardiac rehab. Pt encouraged to increase PO intake before exercise.  Understanding verbalized.

## 2016-06-10 ENCOUNTER — Telehealth (HOSPITAL_COMMUNITY): Payer: Self-pay | Admitting: Cardiac Rehabilitation

## 2016-06-10 ENCOUNTER — Telehealth: Payer: Self-pay | Admitting: Cardiovascular Disease

## 2016-06-10 ENCOUNTER — Encounter (HOSPITAL_COMMUNITY)
Admission: RE | Admit: 2016-06-10 | Discharge: 2016-06-10 | Disposition: A | Discharge status: Home / Self Care | Payer: Medicare Other | Source: Ambulatory Visit | Attending: Cardiovascular Disease | Admitting: Cardiovascular Disease

## 2016-06-10 DIAGNOSIS — I214 Non-ST elevation (NSTEMI) myocardial infarction: Secondary | ICD-10-CM

## 2016-06-10 DIAGNOSIS — Z955 Presence of coronary angioplasty implant and graft: Secondary | ICD-10-CM

## 2016-06-10 NOTE — Telephone Encounter (Signed)
New message   Pt c/o BP issue: STAT if pt c/o blurred vision, one-sided weakness or slurred speech  1. What are your last 5 BP readings? 102/50   92/58 2. Are you having any other symptoms (ex. Dizziness, headache, blurred vision, passed out)? high 3. What is your BP issue? high

## 2016-06-10 NOTE — Telephone Encounter (Signed)
Patient wanted to make sure Dr. Burt Knack was informed of BP readings at cardiac rehab on Wednesday.  Advised they sent a message to MD and he replied to continue to watch without any med changes for now. (see other phone note dated 8/11)  Pt is feeling well, other than she still gets a little lightheaded in the mornings after taking her medications. This is brief and she has no other symptoms.   Taking benicar (20 mg) at hs now.  Takes all AM meds at 9:30 am, advised she could separating medications.  She will try taking diltiazem around 7am to see if this helps.  Pt very appreciative for all information provided.

## 2016-06-10 NOTE — Telephone Encounter (Signed)
-----   Message from Sherren Mocha, MD sent at 06/09/2016  4:24 PM EDT ----- Regarding: RE: cardiac rehab  thx Makar Slatter - would keep an eye for now without med changes. Let me know if recurrent issues. thx Ronalee Belts ----- Message ----- From: Lowell Guitar, RN Sent: 06/08/2016   3:46 PM To: Sherren Mocha, MD Subject: cardiac rehab                                  Dear Dr. Burt Knack,  Pt hypotensive post exercise at cardiac rehab. BP:  90/60  Asymptomatic.  Pt given gatorade with recheck BP:  128/54, HR- 78.  Pre-exercise BP:  118/52 with peak exercise 138/80, HR-95.  Pt tolerated light exercise with short rest break while walking track.   Please advise.  Thank you, Andi Hence, RN, BSN Cardiac Pulmonary Rehab

## 2016-06-13 ENCOUNTER — Encounter (HOSPITAL_COMMUNITY)
Admission: RE | Admit: 2016-06-13 | Discharge: 2016-06-13 | Disposition: A | Discharge status: Home / Self Care | Payer: Medicare Other | Source: Ambulatory Visit | Attending: Cardiovascular Disease | Admitting: Cardiovascular Disease

## 2016-06-13 DIAGNOSIS — I214 Non-ST elevation (NSTEMI) myocardial infarction: Secondary | ICD-10-CM | POA: Diagnosis not present

## 2016-06-13 DIAGNOSIS — Z955 Presence of coronary angioplasty implant and graft: Secondary | ICD-10-CM

## 2016-06-15 ENCOUNTER — Encounter (HOSPITAL_COMMUNITY)
Admission: RE | Admit: 2016-06-15 | Discharge: 2016-06-15 | Disposition: A | Discharge status: Home / Self Care | Payer: Medicare Other | Source: Ambulatory Visit | Attending: Cardiovascular Disease | Admitting: Cardiovascular Disease

## 2016-06-15 DIAGNOSIS — I214 Non-ST elevation (NSTEMI) myocardial infarction: Secondary | ICD-10-CM

## 2016-06-15 DIAGNOSIS — Z955 Presence of coronary angioplasty implant and graft: Secondary | ICD-10-CM

## 2016-06-17 ENCOUNTER — Encounter (HOSPITAL_COMMUNITY)
Admission: RE | Admit: 2016-06-17 | Discharge: 2016-06-17 | Disposition: A | Discharge status: Home / Self Care | Payer: Medicare Other | Source: Ambulatory Visit | Attending: Cardiovascular Disease | Admitting: Cardiovascular Disease

## 2016-06-17 NOTE — Progress Notes (Signed)
Cardiac Individual Treatment Plan  Patient Details  Name: Donna Williamson MRN: [id number] Date of Birth: [birth year] Referring Provider:   Flowsheet Row CARDIAC REHAB PHASE II ORIENTATION from 06/02/2016 in [facility]  Referring Provider  Sherren Mocha, MD      Initial Encounter Date:  [facility] PHASE II ORIENTATION from 06/02/2016 in [facility]  Date  06/02/16  Referring Provider  Sherren Mocha, MD      Visit Diagnosis: No diagnosis found.  Patient's Home Medications on Admission:  Current Outpatient Prescriptions:  .  albuterol (PROVENTIL) (2.5 MG/3ML) 0.083% nebulizer solution, Take 3 mLs (2.5 mg total) by nebulization every 2 (two) hours as needed for wheezing or shortness of breath., Disp: 75 mL, Rfl: 2 .  ALPRAZolam (XANAX) 0.5 MG tablet, Take 0.5 mg by mouth daily as needed. For anxiety, Disp: , Rfl:  .  AMBULATORY NON FORMULARY MEDICATION, Take 90 mg by mouth 2 (two) times daily. Medication Name: BRILINTA 90 mg BID (TWILIGHT RESEARCH STUDY PROVIDED  DO NOT FILL), Disp: , Rfl:  .  AMBULATORY NON FORMULARY MEDICATION, Take 81 mg by mouth daily. Medication Name: ASPIRIN 81 mg Daily TWILIGHT Research study PROVIDED, Disp: , Rfl:  .  Cholecalciferol (VITAMIN D3) 2000 UNITS TABS, Take 1 tablet by mouth daily., Disp: , Rfl:  .  cyanocobalamin 500 MCG tablet, Take 500 mcg by mouth daily., Disp: , Rfl:  .  diltiazem (CARDIZEM CD) 240 MG 24 hr capsule, Take 240 capsules by mouth daily., Disp: , Rfl:  .  EPIPEN 2-PAK 0.3 MG/0.3ML SOAJ injection, Inject 0.3 mg into the skin as needed., Disp: , Rfl:  .  estradiol (ESTRACE) 1 MG tablet, Take 1 mg by mouth daily., Disp: , Rfl:  .  FLUoxetine (PROZAC) 10 MG tablet, Take 10 mg by mouth daily., Disp: , Rfl:  .  fluticasone (FLONASE) 50 MCG/ACT nasal spray, Place 2 sprays into the nose daily., Disp: , Rfl:  .  Fluticasone-Salmeterol (ADVAIR) 100-50 MCG/DOSE AEPB,  Inhale 1 puff into the lungs every 12 (twelve) hours., Disp: , Rfl:  .  furosemide (LASIX) 20 MG tablet, Take 1 tablet (20 mg total) by mouth daily. (Patient taking differently: Take 20 mg by mouth every other day. ), Disp: 30 tablet, Rfl: 1 .  gabapentin (NEURONTIN) 100 MG capsule, Take 100 mg by mouth daily. Supposed to go up to 300 mg, Disp: , Rfl:  .  isosorbide mononitrate (IMDUR) 30 MG 24 hr tablet, Take 1 tablet (30 mg total) by mouth daily., Disp: 30 tablet, Rfl: 0 .  metoprolol tartrate (LOPRESSOR) 25 MG tablet, Take 0.5 tablets (12.5 mg total) by mouth 2 (two) times daily., Disp: 30 tablet, Rfl: 11 .  montelukast (SINGULAIR) 10 MG tablet, Take 10 mg by mouth at bedtime., Disp: , Rfl:  .  Multiple Vitamin (MULTIVITAMIN WITH MINERALS) TABS tablet, Take 1 tablet by mouth daily., Disp: , Rfl:  .  mupirocin ointment (BACTROBAN) 2 %, Apply 1 application topically daily as needed (skin irritation). , Disp: , Rfl: 0 .  nitroGLYCERIN (NITROSTAT) 0.4 MG SL tablet, Place 1 tablet (0.4 mg total) under the tongue every 5 (five) minutes as needed for chest pain., Disp: 25 tablet, Rfl: prn .  olmesartan (BENICAR) 20 MG tablet, Take 1 tablet (20 mg total) by mouth daily., Disp: 30 tablet, Rfl: 11 .  omeprazole (PRILOSEC) 40 MG capsule, Take 40 mg by mouth daily., Disp: , Rfl:  .  PROAIR HFA 108 (90 BASE) MCG/ACT inhaler, Inhale 1-2 puffs into the lungs every 4 (four) hours as needed., Disp: , Rfl:  .  rosuvastatin (CRESTOR) 20 MG tablet, Take 1 tablet (20 mg total) by mouth daily at 6 PM., Disp: 30 tablet, Rfl: 0 .  triamcinolone cream (KENALOG) 0.1 %, Apply 1 application topically 2 (two) times daily., Disp: 30 g, Rfl: 0  Past Medical History: Past Medical History:  Diagnosis Date  . Anemia   . Anxiety   . Arthritis    "feet, knees, back" (05/02/2016)  . Asthma   . CAD (coronary artery disease)    a. 04/2016: NSTEMI 95% stenosis 1st Mrg (s/p DES)  . Chest pain    a. 10/2013 Exercise Myoview: Ef  65%, no ischemia.  . Chronic bronchitis ([place])   . Chronic lower back pain   . GERD (gastroesophageal reflux disease)   . History of blood transfusion 1960s   "related to my hysterectomy"  . Hypertension   . Mitral valve prolapse   . NSTEMI (non-ST elevated myocardial infarction) ([place]) 05/01/2016  . Pneumonia 05/01/2016   "saw trace of slight pneumonia/CT scan" (05/02/2016)  . PONV (postoperative nausea and vomiting)   . Seasonal allergies   . Sjogren's syndrome ([place])     Tobacco Use: History  Smoking Status  . Never Smoker  Smokeless Tobacco  . Never Used    Labs: Recent Review Flowsheet Data    Labs for ITP Cardiac and Pulmonary Rehab Latest Ref Rng & Units 05/01/2016 05/02/2016 05/02/2016 05/02/2016 05/03/2016   Cholestrol 0 - 200 mg/dL - - - 186 186   LDLCALC 0 - 99 mg/dL - - - 84 80   HDL >40 mg/dL - - - 79 77   Trlycerides <150 mg/dL - - - 116 144   Hemoglobin A1c 4.8 - 5.6 % 5.8(H) - - - -   PHART 7.350 - 7.450 - - 7.414 - -   PCO2ART 35.0 - 45.0 mmHg - - 35.6 - -   HCO3 20.0 - 24.0 mEq/L - 24.7(H) 22.8 - -   TCO2 0 - 100 mmol/L - 26 24 - -   ACIDBASEDEF 0.0 - 2.0 mmol/L - - 1.0 - -   O2SAT % - 66.0 92.0 - -      Capillary Blood Glucose: Lab Results  Component Value Date   GLUCAP 133 (H) 05/04/2016   GLUCAP 146 (H) 05/04/2016   GLUCAP 148 (H) 05/04/2016   GLUCAP 147 (H) 05/03/2016   GLUCAP 217 (H) 05/03/2016     Exercise Target Goals:    Exercise Program Goal: Individual exercise prescription set with THRR, safety & activity barriers. Participant demonstrates ability to understand and report RPE using BORG scale, to self-measure pulse accurately, and to acknowledge the importance of the exercise prescription.  Exercise Prescription Goal: Starting with aerobic activity 30 plus minutes a day, 3 days per week for initial exercise prescription. Provide home exercise prescription and guidelines that participant acknowledges understanding prior to discharge.  Activity  Barriers & Risk Stratification:     Activity Barriers & Cardiac Risk Stratification - 06/02/16 0903      Activity Barriers & Cardiac Risk Stratification   Activity Barriers Back Problems;Left Hip Replacement   Cardiac Risk Stratification High      6 Minute Walk:     6 Minute Walk    Row Name 06/02/16 1639         6 Minute Walk   Phase Initial  Distance 1007 feet     Walk Time 6 minutes     # of Rest Breaks 0     MPH 1.91     METS 1.5     RPE 13     VO2 Peak 5.26     Symptoms No     Resting HR 80 bpm     Resting BP 100/58     Max Ex. HR 94 bpm     Max Ex. BP 106/58     2 Minute Post BP 100/62        Initial Exercise Prescription:     Initial Exercise Prescription - 06/02/16 1600      Date of Initial Exercise RX and Referring Provider   Date 06/02/16   Referring Provider Sherren Mocha, MD     Recumbant Bike   Level 1.5   Minutes 10   METs 1.5     NuStep   Level 2   Minutes 10   METs 1.6     Track   Laps 7   Minutes 10   METs 2.23     Prescription Details   Frequency (times per week) 3   Duration Progress to 30 minutes of continuous aerobic without signs/symptoms of physical distress     Intensity   THRR 40-80% of Max Heartrate 59-118   Ratings of Perceived Exertion 11-13   Perceived Dyspnea 0-4     Progression   Progression Continue to progress workloads to maintain intensity without signs/symptoms of physical distress.     Resistance Training   Training Prescription Yes   Weight 1lb   Reps 10-12      Perform Capillary Blood Glucose checks as needed.  Exercise Prescription Changes:   Exercise Comments:     Exercise Comments    Row Name 06/10/16 1222           Exercise Comments There are no changes to Ex Rx. Pt is tolerating exercise very well and will continue to monitor pt progress          Discharge Exercise Prescription (Final Exercise Prescription Changes):   Nutrition:  Target Goals: Understanding of  nutrition guidelines, daily intake of sodium 1500mg , cholesterol 200mg , calories 30% from fat and 7% or less from saturated fats, daily to have 5 or more servings of fruits and vegetables.  Biometrics:     Pre Biometrics - 06/02/16 1643      Pre Biometrics   Height 5' 5.5" (1.664 m)   Weight 224 lb 13.9 oz (102 kg)   Waist Circumference 41.5 inches   Hip Circumference 50 inches   Waist to Hip Ratio 0.83 %   BMI (Calculated) 36.9   Triceps Skinfold 45 mm   % Body Fat 48.7 %   Grip Strength 25 kg   Flexibility 8 in   Single Leg Stand 1.27 seconds       Nutrition Therapy Plan and Nutrition Goals:     Nutrition Therapy & Goals - 06/03/16 1409      Nutrition Therapy   Diet Therapeutic Lifestyle Changes     Personal Nutrition Goals   Personal Goal #1 1-2 lb wt loss/week to a wt loss goal of 6-24 lb at graduation from [facility], educate and counsel regarding individualized specific dietary modifications aiming towards targeted core components such as weight, hypertension, lipid management, diabetes, heart failure and other comorbidities.   Expected Outcomes Short Term Goal: Understand basic  principles of dietary content, such as calories, fat, sodium, cholesterol and nutrients.;Long Term Goal: Adherence to prescribed nutrition plan.      Nutrition Discharge: Nutrition Scores:     Nutrition Assessments - 06/03/16 1410      MEDFICTS Scores   Pre Score 21  will verify score with pt      Nutrition Goals Re-Evaluation:   Psychosocial: Target Goals: Acknowledge presence or absence of depression, maximize coping skills, provide positive support system. Participant is able to verbalize types and ability to use techniques and skills needed for reducing stress and depression.  Initial Review & Psychosocial Screening:     Initial Psych Review & Screening - 06/06/16 1625      Initial Review   Current issues with History  of Depression     Family Dynamics   Good Support System? Yes     Barriers   Psychosocial barriers to participate in program The patient should benefit from training in stress management and relaxation.     Screening Interventions   Interventions Encouraged to exercise      Quality of Life Scores:     Quality of Life - 06/02/16 1647      Quality of Life Scores   Health/Function Pre 26.23 %   Socioeconomic Pre 30 %   Psych/Spiritual Pre 30 %   Family Pre 27 %   GLOBAL Pre 27.92 %      PHQ-9: Recent Review Flowsheet Data    Depression screen [facility] 2/9 06/06/2016 04/02/2016 10/17/2015 08/23/2015   Decreased Interest 0 0 0 0   Down, Depressed, Hopeless 0 0 0 0   PHQ - 2 Score 0 0 0 0      Psychosocial Evaluation and Intervention:     Psychosocial Evaluation - 06/17/16 1026      Psychosocial Evaluation & Interventions   Interventions Stress management education;Relaxation education;Encouraged to exercise with the program and follow exercise prescription   Comments pt does health related anxiety from cardiac event, especially related to returning to work.  pt would like to be able to return to work but is aware of work related stress and health effects.  pt is presently working from home.    Continued Psychosocial Services Needed Yes      Psychosocial Re-Evaluation:   Vocational Rehabilitation: Provide vocational rehab assistance to qualifying candidates.   Vocational Rehab Evaluation & Intervention:     Vocational Rehab - 06/06/16 1625      Initial Vocational Rehab Evaluation & Intervention   Assessment shows need for Vocational Rehabilitation No      Education: Education Goals: Education classes will be provided on a weekly basis, covering required topics. Participant will state understanding/return demonstration of topics presented.  Learning Barriers/Preferences:     Learning Barriers/Preferences - 06/02/16 0904      Learning Barriers/Preferences    Learning Barriers Sight   Learning Preferences Skilled Demonstration      Education Topics: Count Your Pulse:  -Group instruction provided by verbal instruction, demonstration, patient participation and written materials to support subject.  Instructors address importance of being able to find your pulse and how to count your pulse when at home without a heart monitor.  Patients get hands on experience counting their pulse with staff help and individually.   Heart Attack, Angina, and Risk Factor Modification:  -Group instruction provided by verbal instruction, video, and written materials to support subject.  Instructors address signs and symptoms of angina and heart attacks.    Also discuss  risk factors for heart disease and how to make changes to improve heart health risk factors.   Functional Fitness:  -Group instruction provided by verbal instruction, demonstration, patient participation, and written materials to support subject.  Instructors address safety measures for doing things around the house.  Discuss how to get up and down off the floor, how to pick things up properly, how to safely get out of a chair without assistance, and balance training.   Meditation and Mindfulness:  -Group instruction provided by verbal instruction, patient participation, and written materials to support subject.  Instructor addresses importance of mindfulness and meditation practice to help reduce stress and improve awareness.  Instructor also leads participants through a meditation exercise.    Stretching for Flexibility and Mobility:  -Group instruction provided by verbal instruction, patient participation, and written materials to support subject.  Instructors lead participants through series of stretches that are designed to increase flexibility thus improving mobility.  These stretches are additional exercise for major muscle groups that are typically performed during regular warm up and cool  down.   Hands Only CPR Anytime:  -Group instruction provided by verbal instruction, video, patient participation and written materials to support subject.  Instructors co-teach with AHA video for hands only CPR.  Participants get hands on experience with mannequins.   Nutrition I class: Heart Healthy Eating:  -Group instruction provided by PowerPoint slides, verbal discussion, and written materials to support subject matter. The instructor gives an explanation and review of the Therapeutic Lifestyle Changes diet recommendations, which includes a discussion on lipid goals, dietary fat, sodium, fiber, plant stanol/sterol esters, sugar, and the components of a well-balanced, healthy diet.   Nutrition II class: Lifestyle Skills:  -Group instruction provided by PowerPoint slides, verbal discussion, and written materials to support subject matter. The instructor gives an explanation and review of label reading, grocery shopping for heart health, heart healthy recipe modifications, and ways to make healthier choices when eating out.   Diabetes Question & Answer:  -Group instruction provided by PowerPoint slides, verbal discussion, and written materials to support subject matter. The instructor gives an explanation and review of diabetes co-morbidities, pre- and post-prandial blood glucose goals, pre-exercise blood glucose goals, signs, symptoms, and treatment of hypoglycemia and hyperglycemia, and foot care basics. Flowsheet Row CARDIAC REHAB PHASE II EXERCISE from 06/10/2016 in [facility]  Date  06/10/16  Educator  RD  Instruction Review Code  2- meets goals/outcomes      Diabetes Blitz:  -Group instruction provided by PowerPoint slides, verbal discussion, and written materials to support subject matter. The instructor gives an explanation and review of the physiology behind type 1 and type 2 diabetes, diabetes medications and rational behind using different  medications, pre- and post-prandial blood glucose recommendations and Hemoglobin A1c goals, diabetes diet, and exercise including blood glucose guidelines for exercising safely.    Portion Distortion:  -Group instruction provided by PowerPoint slides, verbal discussion, written materials, and food models to support subject matter. The instructor gives an explanation of serving size versus portion size, changes in portions sizes over the last 20 years, and what consists of a serving from each food group. Flowsheet Row CARDIAC REHAB PHASE II EXERCISE from 06/10/2016 in [facility]  Date  06/08/16  Educator  RD  Instruction Review Code  2- meets goals/outcomes      Stress Management:  -Group instruction provided by verbal instruction, video, and written materials to support subject matter.  Instructors review role of stress in heart disease and how to cope with stress positively.     Exercising on Your Own:  -Group instruction provided by verbal instruction, power point, and written materials to support subject.  Instructors discuss benefits of exercise, components of exercise, frequency and intensity of exercise, and end points for exercise.  Also discuss use of nitroglycerin and activating EMS.  Review options of places to exercise outside of rehab.  Review guidelines for sex with heart disease.   Cardiac Drugs I:  -Group instruction provided by verbal instruction and written materials to support subject.  Instructor reviews cardiac drug classes: antiplatelets, anticoagulants, beta blockers, and statins.  Instructor discusses reasons, side effects, and lifestyle considerations for each drug class.   Cardiac Drugs II:  -Group instruction provided by verbal instruction and written materials to support subject.  Instructor reviews cardiac drug classes: angiotensin converting enzyme inhibitors (ACE-I), angiotensin II receptor blockers (ARBs), nitrates, and calcium  channel blockers.  Instructor discusses reasons, side effects, and lifestyle considerations for each drug class.   Anatomy and Physiology of the Circulatory System:  -Group instruction provided by verbal instruction, video, and written materials to support subject.  Reviews functional anatomy of heart, how it relates to various diagnoses, and what role the heart plays in the overall system.   Knowledge Questionnaire Score:     Knowledge Questionnaire Score - 06/02/16 1630      Knowledge Questionnaire Score   Pre Score 19/24      Core Components/Risk Factors/Patient Goals at Admission:     Personal Goals and Risk Factors at Admission - 06/02/16 0905      Core Components/Risk Factors/Patient Goals on Admission    Weight Management Obesity;Yes   Intervention Weight Management: Provide education and appropriate resources to help participant work on and attain dietary goals.;Weight Management: Develop a combined nutrition and exercise program designed to reach desired caloric intake, while maintaining appropriate intake of nutrient and fiber, sodium and fats, and appropriate energy expenditure required for the weight goal.;Weight Management/Obesity: Establish reasonable short term and long term weight goals.;Obesity: Provide education and appropriate resources to help participant work on and attain dietary goals.   Expected Outcomes Short Term: Continue to assess and modify interventions until short term weight is achieved;Weight Maintenance: Understanding of the daily nutrition guidelines, which includes 25-35% calories from fat, 7% or less cal from saturated fats, less than 200mg  cholesterol, less than 1.5gm of sodium, & 5 or more servings of fruits and vegetables daily;Weight Loss: Understanding of general recommendations for a balanced deficit meal plan, which promotes 1-2 lb weight loss per week and includes a negative energy balance of [phone] kcal/d;Understanding recommendations for meals  to include 15-35% energy as protein, 25-35% energy from fat, 35-60% energy from carbohydrates, less than 200mg  of dietary cholesterol, 20-35 gm of total fiber daily;Understanding of distribution of calorie intake throughout the day with the consumption of 4-5 meals/snacks   Sedentary Yes   Intervention Provide advice, education, support and counseling about physical activity/exercise needs.;Develop an individualized exercise prescription for aerobic and resistive training based on initial evaluation findings, risk stratification, comorbidities and participant's personal goals.   Expected Outcomes Achievement of increased cardiorespiratory fitness and enhanced flexibility, muscular endurance and strength shown through measurements of functional capacity and personal statement of participant.   Increase Strength and Stamina Yes   Intervention Provide advice, education, support and counseling about physical activity/exercise needs.;Develop an individualized exercise prescription for aerobic and resistive training based on initial evaluation findings,  risk stratification, comorbidities and participant's personal goals.   Expected Outcomes Achievement of increased cardiorespiratory fitness and enhanced flexibility, muscular endurance and strength shown through measurements of functional capacity and personal statement of participant.   Improve shortness of breath with ADL's Yes   Intervention Provide education, individualized exercise plan and daily activity instruction to help decrease symptoms of SOB with activities of daily living.   Expected Outcomes Short Term: Achieves a reduction of symptoms when performing activities of daily living.   Hypertension Yes   Intervention Provide education on lifestyle modifcations including regular physical activity/exercise, weight management, moderate sodium restriction and increased consumption of fresh fruit, vegetables, and low fat dairy, alcohol moderation, and  smoking cessation.;Monitor prescription use compliance.   Expected Outcomes Short Term: Continued assessment and intervention until BP is < 140/17mm HG in hypertensive participants. < 130/71mm HG in hypertensive participants with diabetes, heart failure or chronic kidney disease.;Long Term: Maintenance of blood pressure at goal levels.   Lipids Yes   Intervention Provide education and support for participant on nutrition & aerobic/resistive exercise along with prescribed medications to achieve LDL 70mg , HDL >40mg .   Expected Outcomes Short Term: Participant states understanding of desired cholesterol values and is compliant with medications prescribed. Participant is following exercise prescription and nutrition guidelines.;Long Term: Cholesterol controlled with medications as prescribed, with individualized exercise RX and with personalized nutrition plan. Value goals: LDL < 70mg , HDL > 40 mg.   Personal Goal Other Yes   Personal Goal short: feel better, improve SOB and lose wt (5-10)  long: lose wt (50lbs), stronger heart and increase confidence   Intervention Provide nutrition education and exercise programming to improve cardiovascular fitness and confidence   Expected Outcomes Pt wil have improved SOB with activities, increased confidence and feel better      Core Components/Risk Factors/Patient Goals Review:    Core Components/Risk Factors/Patient Goals at Discharge (Final Review):    ITP Comments:     ITP Comments    Row Name 06/02/16 0859           ITP Comments Dr. Fransico Him, Medical Director          Comments: Pt is making expected progress toward personal goals after completing 8 sessions. Recommend continued exercise and life style modification education including  stress management and relaxation techniques to decrease cardiac risk profile.

## 2016-06-20 ENCOUNTER — Encounter (HOSPITAL_COMMUNITY)
Admission: RE | Admit: 2016-06-20 | Discharge: 2016-06-20 | Disposition: A | Discharge status: Home / Self Care | Payer: Medicare Other | Source: Ambulatory Visit | Attending: Cardiovascular Disease | Admitting: Cardiovascular Disease

## 2016-06-20 DIAGNOSIS — I214 Non-ST elevation (NSTEMI) myocardial infarction: Secondary | ICD-10-CM

## 2016-06-20 DIAGNOSIS — Z955 Presence of coronary angioplasty implant and graft: Secondary | ICD-10-CM

## 2016-06-20 NOTE — Progress Notes (Signed)
Donna Williamson 70 y.o. female Nutrition Note Spoke with pt. Nutrition Plan and Nutrition Survey goals reviewed with pt. Pt is following Step 2 of the Therapeutic Lifestyle Changes diet. Pt is pre-diabetic according to her last A1c. Pt is aware of pre-diabetes dx. Pt with dx of CHF. Per discussion, pt is watching sodium carefully. Pt eats out frequently. Pt states she asks for her food to be cooked "without anything on it and I've eaten a lot of broccoli." Pt expressed understanding of the information reviewed. Pt aware of nutrition education classes offered.  Lab Results  Component Value Date   HGBA1C 5.8 (H) 05/01/2016   Wt Readings from Last 3 Encounters:  06/02/16 224 lb 13.9 oz (102 kg)  05/10/16 229 lb 6.4 oz (104.1 kg)  05/10/16 229 lb 6.4 oz (104.1 kg)   Nutrition Diagnosis ? Food-and nutrition-related knowledge deficit related to lack of exposure to information as related to diagnosis of: ? CVD ? Pre-DM ? Obesity related to excessive energy intake as evidenced by a BMI of 36.9  Nutrition Intervention ? Pt's individual nutrition plan reviewed with pt. ? Benefits of adopting Therapeutic Lifestyle Changes discussed when [employer] reviewed. ? Pt to attend the Portion Distortion class ? Pt to attend the   ? Nutrition I class                     ? Nutrition II class  ? Pt given handouts for: ? Nutrition I class ? Nutrition II class  ? Continue client-centered nutrition education by RD, as part of interdisciplinary care. Goal(s) ? Pt to identify food quantities necessary to achieve weight loss of 6-24 lb (2.7-10.9 kg) at graduation from cardiac rehab.  Monitor and Evaluate progress toward nutrition goal with team. Derek Mound, M.Ed, RD, LDN, CDE 06/20/2016 2:25 PM

## 2016-06-20 NOTE — Progress Notes (Signed)
Reviewed home exercise with pt today.  Pt plans to walk for exercise, 2x/week in addition to cardiac rehab.  Reviewed THR, pulse, RPE, sign and symptoms, NTG use, and when to call 911 or MD.  Also discussed weather considerations and indoor options.  Pt voiced understanding.   [name] [employer]

## 2016-06-22 ENCOUNTER — Encounter (HOSPITAL_COMMUNITY)
Admission: RE | Admit: 2016-06-22 | Discharge: 2016-06-22 | Disposition: A | Discharge status: Home / Self Care | Payer: Medicare Other | Source: Ambulatory Visit | Attending: Cardiovascular Disease | Admitting: Cardiovascular Disease

## 2016-06-22 DIAGNOSIS — I214 Non-ST elevation (NSTEMI) myocardial infarction: Secondary | ICD-10-CM | POA: Diagnosis not present

## 2016-06-22 DIAGNOSIS — Z955 Presence of coronary angioplasty implant and graft: Secondary | ICD-10-CM

## 2016-06-24 ENCOUNTER — Encounter (HOSPITAL_COMMUNITY)
Admission: RE | Admit: 2016-06-24 | Discharge: 2016-06-24 | Disposition: A | Discharge status: Home / Self Care | Payer: Medicare Other | Source: Ambulatory Visit | Attending: Cardiovascular Disease | Admitting: Cardiovascular Disease

## 2016-06-24 DIAGNOSIS — I214 Non-ST elevation (NSTEMI) myocardial infarction: Secondary | ICD-10-CM

## 2016-06-24 DIAGNOSIS — Z955 Presence of coronary angioplasty implant and graft: Secondary | ICD-10-CM

## 2016-06-27 ENCOUNTER — Encounter (HOSPITAL_COMMUNITY)
Admission: RE | Admit: 2016-06-27 | Discharge: 2016-06-27 | Disposition: A | Discharge status: Home / Self Care | Payer: Medicare Other | Source: Ambulatory Visit | Attending: Cardiovascular Disease | Admitting: Cardiovascular Disease

## 2016-06-27 DIAGNOSIS — I214 Non-ST elevation (NSTEMI) myocardial infarction: Secondary | ICD-10-CM | POA: Diagnosis not present

## 2016-06-27 DIAGNOSIS — Z955 Presence of coronary angioplasty implant and graft: Secondary | ICD-10-CM

## 2016-06-29 ENCOUNTER — Encounter (HOSPITAL_COMMUNITY)
Admission: RE | Admit: 2016-06-29 | Discharge: 2016-06-29 | Disposition: A | Discharge status: Home / Self Care | Payer: Medicare Other | Source: Ambulatory Visit | Attending: Cardiovascular Disease | Admitting: Cardiovascular Disease

## 2016-06-29 DIAGNOSIS — I214 Non-ST elevation (NSTEMI) myocardial infarction: Secondary | ICD-10-CM

## 2016-06-29 DIAGNOSIS — Z955 Presence of coronary angioplasty implant and graft: Secondary | ICD-10-CM

## 2016-06-30 ENCOUNTER — Telehealth: Payer: Self-pay | Admitting: *Deleted

## 2016-06-30 NOTE — Telephone Encounter (Signed)
Returned call to patient. She had some concern about "blurry vision" states she thinks its either seasonal allergies and or her Sjogren syndrome flaring up. She wanted to make sure it wasn't anything related to [place]. I reviewed side effects and this isn't listed. She has an upcoming vision check and isn't to worried about the issue. She states she will monitor her symptoms and call if the need arises.

## 2016-07-01 ENCOUNTER — Encounter (HOSPITAL_COMMUNITY)
Admission: RE | Admit: 2016-07-01 | Discharge: 2016-07-01 | Disposition: A | Discharge status: Home / Self Care | Payer: Medicare Other | Source: Ambulatory Visit | Attending: Cardiovascular Disease | Admitting: Cardiovascular Disease

## 2016-07-01 DIAGNOSIS — I214 Non-ST elevation (NSTEMI) myocardial infarction: Secondary | ICD-10-CM | POA: Insufficient documentation

## 2016-07-01 DIAGNOSIS — I2510 Atherosclerotic heart disease of native coronary artery without angina pectoris: Secondary | ICD-10-CM | POA: Insufficient documentation

## 2016-07-01 DIAGNOSIS — Z955 Presence of coronary angioplasty implant and graft: Secondary | ICD-10-CM | POA: Diagnosis present

## 2016-07-06 ENCOUNTER — Encounter (HOSPITAL_COMMUNITY)
Admission: RE | Admit: 2016-07-06 | Discharge: 2016-07-06 | Disposition: A | Discharge status: Home / Self Care | Payer: Medicare Other | Source: Ambulatory Visit | Attending: Cardiovascular Disease | Admitting: Cardiovascular Disease

## 2016-07-06 DIAGNOSIS — Z955 Presence of coronary angioplasty implant and graft: Secondary | ICD-10-CM

## 2016-07-06 DIAGNOSIS — I214 Non-ST elevation (NSTEMI) myocardial infarction: Secondary | ICD-10-CM | POA: Diagnosis not present

## 2016-07-08 ENCOUNTER — Encounter (HOSPITAL_COMMUNITY)
Admission: RE | Admit: 2016-07-08 | Discharge: 2016-07-08 | Disposition: A | Discharge status: Home / Self Care | Payer: Medicare Other | Source: Ambulatory Visit | Attending: Cardiovascular Disease | Admitting: Cardiovascular Disease

## 2016-07-08 DIAGNOSIS — Z955 Presence of coronary angioplasty implant and graft: Secondary | ICD-10-CM

## 2016-07-08 DIAGNOSIS — I214 Non-ST elevation (NSTEMI) myocardial infarction: Secondary | ICD-10-CM | POA: Diagnosis not present

## 2016-07-11 ENCOUNTER — Telehealth (HOSPITAL_COMMUNITY): Payer: Self-pay | Admitting: Internal Medicine

## 2016-07-11 ENCOUNTER — Encounter (HOSPITAL_COMMUNITY): Payer: Medicare Other

## 2016-07-13 ENCOUNTER — Encounter (HOSPITAL_COMMUNITY)
Admission: RE | Admit: 2016-07-13 | Discharge: 2016-07-13 | Disposition: A | Discharge status: Home / Self Care | Payer: Medicare Other | Source: Ambulatory Visit | Attending: Cardiovascular Disease | Admitting: Cardiovascular Disease

## 2016-07-13 DIAGNOSIS — Z955 Presence of coronary angioplasty implant and graft: Secondary | ICD-10-CM

## 2016-07-13 DIAGNOSIS — I214 Non-ST elevation (NSTEMI) myocardial infarction: Secondary | ICD-10-CM | POA: Diagnosis not present

## 2016-07-13 NOTE — Progress Notes (Signed)
Libby returned to exercise today and exercised without complaints.Barnet Pall, RN,BSN 07/13/2016 2:50 PM

## 2016-07-15 ENCOUNTER — Encounter (HOSPITAL_COMMUNITY)
Admission: RE | Admit: 2016-07-15 | Discharge: 2016-07-15 | Disposition: A | Discharge status: Home / Self Care | Payer: Medicare Other | Source: Ambulatory Visit | Attending: Cardiovascular Disease | Admitting: Cardiovascular Disease

## 2016-07-15 DIAGNOSIS — I214 Non-ST elevation (NSTEMI) myocardial infarction: Secondary | ICD-10-CM | POA: Diagnosis not present

## 2016-07-15 DIAGNOSIS — Z955 Presence of coronary angioplasty implant and graft: Secondary | ICD-10-CM

## 2016-07-15 NOTE — Progress Notes (Signed)
Cardiac Individual Treatment Plan  Patient Details  Name: Donna Williamson MRN: [id number] Date of Birth: [birth year] Referring Provider:   Flowsheet Row CARDIAC REHAB PHASE II ORIENTATION from 06/02/2016 in [facility]  Referring Provider  Sherren Mocha, MD      Initial Encounter Date:  [place] PHASE II ORIENTATION from 06/02/2016 in [facility]  Date  06/02/16  Referring Provider  Sherren Mocha, MD      Visit Diagnosis: 05/01/16 NSTEMI (non-ST elevated myocardial infarction) ([place])  05/02/16 Stented coronary artery  Patient's Home Medications on Admission:  Current Outpatient Prescriptions:  .  albuterol (PROVENTIL) (2.5 MG/3ML) 0.083% nebulizer solution, Take 3 mLs (2.5 mg total) by nebulization every 2 (two) hours as needed for wheezing or shortness of breath., Disp: 75 mL, Rfl: 2 .  ALPRAZolam (XANAX) 0.5 MG tablet, Take 0.5 mg by mouth daily as needed. For anxiety, Disp: , Rfl:  .  AMBULATORY NON FORMULARY MEDICATION, Take 90 mg by mouth 2 (two) times daily. Medication Name: BRILINTA 90 mg BID (TWILIGHT RESEARCH STUDY PROVIDED  DO NOT FILL), Disp: , Rfl:  .  AMBULATORY NON FORMULARY MEDICATION, Take 81 mg by mouth daily. Medication Name: ASPIRIN 81 mg Daily TWILIGHT Research study PROVIDED, Disp: , Rfl:  .  Cholecalciferol (VITAMIN D3) 2000 UNITS TABS, Take 1 tablet by mouth daily., Disp: , Rfl:  .  cyanocobalamin 500 MCG tablet, Take 500 mcg by mouth daily., Disp: , Rfl:  .  diltiazem (CARDIZEM CD) 240 MG 24 hr capsule, Take 240 capsules by mouth daily., Disp: , Rfl:  .  EPIPEN 2-PAK 0.3 MG/0.3ML SOAJ injection, Inject 0.3 mg into the skin as needed., Disp: , Rfl:  .  estradiol (ESTRACE) 1 MG tablet, Take 1 mg by mouth daily., Disp: , Rfl:  .  FLUoxetine (PROZAC) 10 MG tablet, Take 10 mg by mouth daily., Disp: , Rfl:  .  fluticasone (FLONASE) 50 MCG/ACT nasal spray, Place 2 sprays into the nose  daily., Disp: , Rfl:  .  Fluticasone-Salmeterol (ADVAIR) 100-50 MCG/DOSE AEPB, Inhale 1 puff into the lungs every 12 (twelve) hours., Disp: , Rfl:  .  furosemide (LASIX) 20 MG tablet, Take 1 tablet (20 mg total) by mouth daily. (Patient taking differently: Take 20 mg by mouth every other day. ), Disp: 30 tablet, Rfl: 1 .  gabapentin (NEURONTIN) 100 MG capsule, Take 100 mg by mouth daily. Supposed to go up to 300 mg, Disp: , Rfl:  .  isosorbide mononitrate (IMDUR) 30 MG 24 hr tablet, Take 1 tablet (30 mg total) by mouth daily., Disp: 30 tablet, Rfl: 0 .  metoprolol tartrate (LOPRESSOR) 25 MG tablet, Take 0.5 tablets (12.5 mg total) by mouth 2 (two) times daily., Disp: 30 tablet, Rfl: 11 .  montelukast (SINGULAIR) 10 MG tablet, Take 10 mg by mouth at bedtime., Disp: , Rfl:  .  Multiple Vitamin (MULTIVITAMIN WITH MINERALS) TABS tablet, Take 1 tablet by mouth daily., Disp: , Rfl:  .  mupirocin ointment (BACTROBAN) 2 %, Apply 1 application topically daily as needed (skin irritation). , Disp: , Rfl: 0 .  nitroGLYCERIN (NITROSTAT) 0.4 MG SL tablet, Place 1 tablet (0.4 mg total) under the tongue every 5 (five) minutes as needed for chest pain., Disp: 25 tablet, Rfl: prn .  olmesartan (BENICAR) 20 MG tablet, Take 1 tablet (20 mg total) by mouth daily., Disp: 30 tablet, Rfl: 11 .  omeprazole (PRILOSEC) 40 MG capsule, Take 40 mg  by mouth daily., Disp: , Rfl:  .  PROAIR HFA 108 (90 BASE) MCG/ACT inhaler, Inhale 1-2 puffs into the lungs every 4 (four) hours as needed., Disp: , Rfl:  .  rosuvastatin (CRESTOR) 20 MG tablet, Take 1 tablet (20 mg total) by mouth daily at 6 PM., Disp: 30 tablet, Rfl: 0 .  triamcinolone cream (KENALOG) 0.1 %, Apply 1 application topically 2 (two) times daily., Disp: 30 g, Rfl: 0  Past Medical History: Past Medical History:  Diagnosis Date  . Anemia   . Anxiety   . Arthritis    "feet, knees, back" (05/02/2016)  . Asthma   . CAD (coronary artery disease)    a. 04/2016: NSTEMI 95%  stenosis 1st Mrg (s/p DES)  . Chest pain    a. 10/2013 Exercise Myoview: Ef 65%, no ischemia.  . Chronic bronchitis ([place])   . Chronic lower back pain   . GERD (gastroesophageal reflux disease)   . History of blood transfusion 1960s   "related to my hysterectomy"  . Hypertension   . Mitral valve prolapse   . NSTEMI (non-ST elevated myocardial infarction) ([place]) 05/01/2016  . Pneumonia 05/01/2016   "saw trace of slight pneumonia/CT scan" (05/02/2016)  . PONV (postoperative nausea and vomiting)   . Seasonal allergies   . Sjogren's syndrome ([place])     Tobacco Use: History  Smoking Status  . Never Smoker  Smokeless Tobacco  . Never Used    Labs: Recent Review Flowsheet Data    Labs for ITP Cardiac and Pulmonary Rehab Latest Ref Rng & Units 05/01/2016 05/02/2016 05/02/2016 05/02/2016 05/03/2016   Cholestrol 0 - 200 mg/dL - - - 186 186   LDLCALC 0 - 99 mg/dL - - - 84 80   HDL >40 mg/dL - - - 79 77   Trlycerides <150 mg/dL - - - 116 144   Hemoglobin A1c 4.8 - 5.6 % 5.8(H) - - - -   PHART 7.350 - 7.450 - - 7.414 - -   PCO2ART 35.0 - 45.0 mmHg - - 35.6 - -   HCO3 20.0 - 24.0 mEq/L - 24.7(H) 22.8 - -   TCO2 0 - 100 mmol/L - 26 24 - -   ACIDBASEDEF 0.0 - 2.0 mmol/L - - 1.0 - -   O2SAT % - 66.0 92.0 - -      Capillary Blood Glucose: Lab Results  Component Value Date   GLUCAP 133 (H) 05/04/2016   GLUCAP 146 (H) 05/04/2016   GLUCAP 148 (H) 05/04/2016   GLUCAP 147 (H) 05/03/2016   GLUCAP 217 (H) 05/03/2016     Exercise Target Goals:    Exercise Program Goal: Individual exercise prescription set with THRR, safety & activity barriers. Participant demonstrates ability to understand and report RPE using BORG scale, to self-measure pulse accurately, and to acknowledge the importance of the exercise prescription.  Exercise Prescription Goal: Starting with aerobic activity 30 plus minutes a day, 3 days per week for initial exercise prescription. Provide home exercise prescription and guidelines  that participant acknowledges understanding prior to discharge.  Activity Barriers & Risk Stratification:     Activity Barriers & Cardiac Risk Stratification - 06/02/16 0903      Activity Barriers & Cardiac Risk Stratification   Activity Barriers Back Problems;Left Hip Replacement   Cardiac Risk Stratification High      6 Minute Walk:     6 Minute Walk    Row Name 06/02/16 1639         6  Minute Walk   Phase Initial     Distance 1007 feet     Walk Time 6 minutes     # of Rest Breaks 0     MPH 1.91     METS 1.5     RPE 13     VO2 Peak 5.26     Symptoms No     Resting HR 80 bpm     Resting BP 100/58     Max Ex. HR 94 bpm     Max Ex. BP 106/58     2 Minute Post BP 100/62        Initial Exercise Prescription:     Initial Exercise Prescription - 06/02/16 1600      Date of Initial Exercise RX and Referring Provider   Date 06/02/16   Referring Provider Sherren Mocha, MD     Recumbant Bike   Level 1.5   Minutes 10   METs 1.5     NuStep   Level 2   Minutes 10   METs 1.6     Track   Laps 7   Minutes 10   METs 2.23     Prescription Details   Frequency (times per week) 3   Duration Progress to 30 minutes of continuous aerobic without signs/symptoms of physical distress     Intensity   THRR 40-80% of Max Heartrate 59-118   Ratings of Perceived Exertion 11-13   Perceived Dyspnea 0-4     Progression   Progression Continue to progress workloads to maintain intensity without signs/symptoms of physical distress.     Resistance Training   Training Prescription Yes   Weight 1lb   Reps 10-12      Perform Capillary Blood Glucose checks as needed.  Exercise Prescription Changes:     Exercise Prescription Changes    Row Name 06/23/16 1200 07/14/16 1100           Exercise Review   Progression Yes Yes        Response to Exercise   Blood Pressure (Admit) 120/60 104/62      Blood Pressure (Exercise) 118/60 112/50      Blood Pressure (Exit)  108/68 105/68      Heart Rate (Admit) 88 bpm 83 bpm      Heart Rate (Exercise) 114 bpm 104 bpm      Heart Rate (Exit) 86 bpm 83 bpm      Rating of Perceived Exertion (Exercise) 12 12      Duration Progress to 30 minutes of continuous aerobic without signs/symptoms of physical distress Progress to 30 minutes of continuous aerobic without signs/symptoms of physical distress      Intensity THRR unchanged THRR unchanged        Progression   Progression Continue to progress workloads to maintain intensity without signs/symptoms of physical distress. Continue to progress workloads to maintain intensity without signs/symptoms of physical distress.      Average METs 2.5 2.9        Resistance Training   Training Prescription Yes Yes      Weight 1lb 2lbs      Reps 10-12 10-12        Recumbant Bike   Level 1.5 4      Minutes 10 10      METs 1.5 2.3        NuStep   Level 3 3      Minutes 10 10      METs 2.5 3  Track   Laps 10 11      Minutes 10 10      METs 2.74 2.92        Home Exercise Plan   Plans to continue exercise at Home  reviewed HEP on 06/13/16 see progress note Home  reviewed HEP on 06/13/16 see progress note      Frequency Add 2 additional days to program exercise sessions. Add 2 additional days to program exercise sessions.         Exercise Comments:     Exercise Comments    Row Name 06/10/16 1222 06/23/16 1253 07/14/16 1141       Exercise Comments There are no changes to Ex Rx. Pt is tolerating exercise very well and will continue to monitor pt progress Reviewed MET's. Pt is tolerating exercise well will continue to monitor exercise progression Reviewed MET's. Pt is tolerating exercise well will continue to monitor exercise progression        Discharge Exercise Prescription (Final Exercise Prescription Changes):     Exercise Prescription Changes - 07/14/16 1100      Exercise Review   Progression Yes     Response to Exercise   Blood Pressure (Admit)  104/62   Blood Pressure (Exercise) 112/50   Blood Pressure (Exit) 105/68   Heart Rate (Admit) 83 bpm   Heart Rate (Exercise) 104 bpm   Heart Rate (Exit) 83 bpm   Rating of Perceived Exertion (Exercise) 12   Duration Progress to 30 minutes of continuous aerobic without signs/symptoms of physical distress   Intensity THRR unchanged     Progression   Progression Continue to progress workloads to maintain intensity without signs/symptoms of physical distress.   Average METs 2.9     Resistance Training   Training Prescription Yes   Weight 2lbs   Reps 10-12     Recumbant Bike   Level 4   Minutes 10   METs 2.3     NuStep   Level 3   Minutes 10   METs 3     Track   Laps 11   Minutes 10   METs 2.92     Home Exercise Plan   Plans to continue exercise at Home  reviewed HEP on 06/13/16 see progress note   Frequency Add 2 additional days to program exercise sessions.      Nutrition:  Target Goals: Understanding of nutrition guidelines, daily intake of sodium <1566m, cholesterol <2073m calories 30% from fat and 7% or less from saturated fats, daily to have 5 or more servings of fruits and vegetables.  Biometrics:     Pre Biometrics - 06/02/16 1643      Pre Biometrics   Height 5' 5.5" (1.664 m)   Weight 224 lb 13.9 oz (102 kg)   Waist Circumference 41.5 inches   Hip Circumference 50 inches   Waist to Hip Ratio 0.83 %   BMI (Calculated) 36.9   Triceps Skinfold 45 mm   % Body Fat 48.7 %   Grip Strength 25 kg   Flexibility 8 in   Single Leg Stand 1.27 seconds       Nutrition Therapy Plan and Nutrition Goals:     Nutrition Therapy & Goals - 06/03/16 1409      Nutrition Therapy   Diet Therapeutic Lifestyle Changes     Personal Nutrition Goals   Personal Goal #1 1-2 lb wt loss/week to a wt loss goal of 6-24 lb at graduation from [place]  Intervention Plan   Intervention Prescribe, educate and counsel regarding individualized specific dietary  modifications aiming towards targeted core components such as weight, hypertension, lipid management, diabetes, heart failure and other comorbidities.   Expected Outcomes Short Term Goal: Understand basic principles of dietary content, such as calories, fat, sodium, cholesterol and nutrients.;Long Term Goal: Adherence to prescribed nutrition plan.      Nutrition Discharge: Nutrition Scores:     Nutrition Assessments - 06/03/16 1410      MEDFICTS Scores   Pre Score 21  will verify score with pt      Nutrition Goals Re-Evaluation:   Psychosocial: Target Goals: Acknowledge presence or absence of depression, maximize coping skills, provide positive support system. Participant is able to verbalize types and ability to use techniques and skills needed for reducing stress and depression.  Initial Review & Psychosocial Screening:     Initial Psych Review & Screening - 06/06/16 1625      Initial Review   Current issues with History of Depression     Family Dynamics   Good Support System? Yes     Barriers   Psychosocial barriers to participate in program The patient should benefit from training in stress management and relaxation.     Screening Interventions   Interventions Encouraged to exercise      Quality of Life Scores:     Quality of Life - 06/02/16 1647      Quality of Life Scores   Health/Function Pre 26.23 %   Socioeconomic Pre 30 %   Psych/Spiritual Pre 30 %   Family Pre 27 %   GLOBAL Pre 27.92 %      PHQ-9: Recent Review Flowsheet Data    Depression screen [facility] 2/9 06/06/2016 04/02/2016 10/17/2015 08/23/2015   Decreased Interest 0 0 0 0   Down, Depressed, Hopeless 0 0 0 0   PHQ - 2 Score 0 0 0 0      Psychosocial Evaluation and Intervention:     Psychosocial Evaluation - 06/17/16 1026      Psychosocial Evaluation & Interventions   Interventions Stress management education;Relaxation education;Encouraged to exercise with the program and follow exercise  prescription   Comments pt does health related anxiety from cardiac event, especially related to returning to work.  pt would like to be able to return to work but is aware of work related stress and health effects.  pt is presently working from home.    Continued Psychosocial Services Needed Yes      Psychosocial Re-Evaluation:     Psychosocial Re-Evaluation    [place] Name 07/15/16 1629             Psychosocial Re-Evaluation   Interventions Stress management education;Relaxation education;Encouraged to attend Cardiac Rehabilitation for the exercise       Comments pt health related anxiety is decreasing. pt verbalizes she looks forward to exercise at cardiac rehab and feels like she has more strenfth and stamina. no psychosocial barriers identified, no interventions necessary.        Continued Psychosocial Services Needed No          Vocational Rehabilitation: Provide vocational rehab assistance to qualifying candidates.   Vocational Rehab Evaluation & Intervention:     Vocational Rehab - 06/06/16 1625      Initial Vocational Rehab Evaluation & Intervention   Assessment shows need for Vocational Rehabilitation No      Education: Education Goals: Education classes will be provided on a weekly basis, covering required topics. Participant will  state understanding/return demonstration of topics presented.  Learning Barriers/Preferences:     Learning Barriers/Preferences - 06/02/16 0904      Learning Barriers/Preferences   Learning Barriers Sight   Learning Preferences Skilled Demonstration      Education Topics: Count Your Pulse:  -Group instruction provided by verbal instruction, demonstration, patient participation and written materials to support subject.  Instructors address importance of being able to find your pulse and how to count your pulse when at home without a heart monitor.  Patients get hands on experience counting their pulse with staff help and  individually. Flowsheet Row CARDIAC REHAB PHASE II EXERCISE from 07/13/2016 in [facility]  Date  07/01/16  Educator  Barnet Pall, RN  Instruction Review Code  2- meets goals/outcomes      Heart Attack, Angina, and Risk Factor Modification:  -Group instruction provided by verbal instruction, video, and written materials to support subject.  Instructors address signs and symptoms of angina and heart attacks.    Also discuss risk factors for heart disease and how to make changes to improve heart health risk factors. Flowsheet Row CARDIAC REHAB PHASE II EXERCISE from 07/13/2016 in [facility]  Date  06/22/16  Educator  RN  Instruction Review Code  2- meets goals/outcomes      Functional Fitness:  -Group instruction provided by verbal instruction, demonstration, patient participation, and written materials to support subject.  Instructors address safety measures for doing things around the house.  Discuss how to get up and down off the floor, how to pick things up properly, how to safely get out of a chair without assistance, and balance training.   Meditation and Mindfulness:  -Group instruction provided by verbal instruction, patient participation, and written materials to support subject.  Instructor addresses importance of mindfulness and meditation practice to help reduce stress and improve awareness.  Instructor also leads participants through a meditation exercise.  Flowsheet Row CARDIAC REHAB PHASE II EXERCISE from 07/13/2016 in [place]  Date  06/29/16  Educator  Jeanella Craze  Instruction Review Code  2- meets goals/outcomes      Stretching for Flexibility and Mobility:  -Group instruction provided by verbal instruction, patient participation, and written materials to support subject.  Instructors lead participants through series of stretches that are designed to increase flexibility  thus improving mobility.  These stretches are additional exercise for major muscle groups that are typically performed during regular warm up and cool down. Flowsheet Row CARDIAC REHAB PHASE II EXERCISE from 07/13/2016 in [facility]  Date  06/24/16  Instruction Review Code  2- meets goals/outcomes      Hands Only CPR Anytime:  -Group instruction provided by verbal instruction, video, patient participation and written materials to support subject.  Instructors co-teach with AHA video for hands only CPR.  Participants get hands on experience with mannequins.   Nutrition I class: Heart Healthy Eating:  -Group instruction provided by PowerPoint slides, verbal discussion, and written materials to support subject matter. The instructor gives an explanation and review of the Therapeutic Lifestyle Changes diet recommendations, which includes a discussion on lipid goals, dietary fat, sodium, fiber, plant stanol/sterol esters, sugar, and the components of a well-balanced, healthy diet. Flowsheet Row CARDIAC REHAB PHASE II EXERCISE from 07/13/2016 in [place]  Date  06/20/16  Educator  RD  Instruction Review Code  Not applicable [class handouts given]  Nutrition II class: Lifestyle Skills:  -Group instruction provided by PowerPoint slides, verbal discussion, and written materials to support subject matter. The instructor gives an explanation and review of label reading, grocery shopping for heart health, heart healthy recipe modifications, and ways to make healthier choices when eating out. Flowsheet Row CARDIAC REHAB PHASE II EXERCISE from 07/13/2016 in [place]  Date  06/20/16  Educator  RD  Instruction Review Code  Not applicable [class handouts given]      Diabetes Question & Answer:  -Group instruction provided by PowerPoint slides, verbal discussion, and written materials to support subject  matter. The instructor gives an explanation and review of diabetes co-morbidities, pre- and post-prandial blood glucose goals, pre-exercise blood glucose goals, signs, symptoms, and treatment of hypoglycemia and hyperglycemia, and foot care basics. Flowsheet Row CARDIAC REHAB PHASE II EXERCISE from 07/13/2016 in [place]  Date  06/10/16  Educator  RD  Instruction Review Code  2- meets goals/outcomes      Diabetes Blitz:  -Group instruction provided by PowerPoint slides, verbal discussion, and written materials to support subject matter. The instructor gives an explanation and review of the physiology behind type 1 and type 2 diabetes, diabetes medications and rational behind using different medications, pre- and post-prandial blood glucose recommendations and Hemoglobin A1c goals, diabetes diet, and exercise including blood glucose guidelines for exercising safely.    Portion Distortion:  -Group instruction provided by PowerPoint slides, verbal discussion, written materials, and food models to support subject matter. The instructor gives an explanation of serving size versus portion size, changes in portions sizes over the last 20 years, and what consists of a serving from each food group. Flowsheet Row CARDIAC REHAB PHASE II EXERCISE from 07/13/2016 in [place]  Date  06/08/16  Educator  RD  Instruction Review Code  2- meets goals/outcomes      Stress Management:  -Group instruction provided by verbal instruction, video, and written materials to support subject matter.  Instructors review role of stress in heart disease and how to cope with stress positively.     Exercising on Your Own:  -Group instruction provided by verbal instruction, power point, and written materials to support subject.  Instructors discuss benefits of exercise, components of exercise, frequency and intensity of exercise, and end points for exercise.   Also discuss use of nitroglycerin and activating EMS.  Review options of places to exercise outside of rehab.  Review guidelines for sex with heart disease.   Cardiac Drugs I:  -Group instruction provided by verbal instruction and written materials to support subject.  Instructor reviews cardiac drug classes: antiplatelets, anticoagulants, beta blockers, and statins.  Instructor discusses reasons, side effects, and lifestyle considerations for each drug class.   Cardiac Drugs II:  -Group instruction provided by verbal instruction and written materials to support subject.  Instructor reviews cardiac drug classes: angiotensin converting enzyme inhibitors (ACE-I), angiotensin II receptor blockers (ARBs), nitrates, and calcium channel blockers.  Instructor discusses reasons, side effects, and lifestyle considerations for each drug class. Flowsheet Row CARDIAC REHAB PHASE II EXERCISE from 07/13/2016 in [place]  Date  07/13/16  Educator  Pharm D  Instruction Review Code  2- meets goals/outcomes      Anatomy and Physiology of the Circulatory System:  -Group instruction provided by verbal instruction, video, and written materials to support subject.  Reviews functional anatomy of heart, how it relates to various  diagnoses, and what role the heart plays in the overall system. Flowsheet Row CARDIAC REHAB PHASE II EXERCISE from 07/13/2016 in [place]  Date  07/06/16  Instruction Review Code  2- meets goals/outcomes      Knowledge Questionnaire Score:     Knowledge Questionnaire Score - 06/02/16 1630      Knowledge Questionnaire Score   Pre Score 19/24      Core Components/Risk Factors/Patient Goals at Admission:     Personal Goals and Risk Factors at Admission - 06/02/16 0905      Core Components/Risk Factors/Patient Goals on Admission    Weight Management Obesity;Yes   Intervention Weight Management: Provide education  and appropriate resources to help participant work on and attain dietary goals.;Weight Management: Develop a combined nutrition and exercise program designed to reach desired caloric intake, while maintaining appropriate intake of nutrient and fiber, sodium and fats, and appropriate energy expenditure required for the weight goal.;Weight Management/Obesity: Establish reasonable short term and long term weight goals.;Obesity: Provide education and appropriate resources to help participant work on and attain dietary goals.   Expected Outcomes Short Term: Continue to assess and modify interventions until short term weight is achieved;Weight Maintenance: Understanding of the daily nutrition guidelines, which includes 25-35% calories from fat, 7% or less cal from saturated fats, less than 282m cholesterol, less than 1.5gm of sodium, & 5 or more servings of fruits and vegetables daily;Weight Loss: Understanding of general recommendations for a balanced deficit meal plan, which promotes 1-2 lb weight loss per week and includes a negative energy balance of [phone] kcal/d;Understanding recommendations for meals to include 15-35% energy as protein, 25-35% energy from fat, 35-60% energy from carbohydrates, less than 2043mof dietary cholesterol, 20-35 gm of total fiber daily;Understanding of distribution of calorie intake throughout the day with the consumption of 4-5 meals/snacks   Sedentary Yes   Intervention Provide advice, education, support and counseling about physical activity/exercise needs.;Develop an individualized exercise prescription for aerobic and resistive training based on initial evaluation findings, risk stratification, comorbidities and participant's personal goals.   Expected Outcomes Achievement of increased cardiorespiratory fitness and enhanced flexibility, muscular endurance and strength shown through measurements of functional capacity and personal statement of participant.   Increase Strength  and Stamina Yes   Intervention Provide advice, education, support and counseling about physical activity/exercise needs.;Develop an individualized exercise prescription for aerobic and resistive training based on initial evaluation findings, risk stratification, comorbidities and participant's personal goals.   Expected Outcomes Achievement of increased cardiorespiratory fitness and enhanced flexibility, muscular endurance and strength shown through measurements of functional capacity and personal statement of participant.   Improve shortness of breath with ADL's Yes   Intervention Provide education, individualized exercise plan and daily activity instruction to help decrease symptoms of SOB with activities of daily living.   Expected Outcomes Short Term: Achieves a reduction of symptoms when performing activities of daily living.   Hypertension Yes   Intervention Provide education on lifestyle modifcations including regular physical activity/exercise, weight management, moderate sodium restriction and increased consumption of fresh fruit, vegetables, and low fat dairy, alcohol moderation, and smoking cessation.;Monitor prescription use compliance.   Expected Outcomes Short Term: Continued assessment and intervention until BP is < 140/9070mG in hypertensive participants. < 130/87m67m in hypertensive participants with diabetes, heart failure or chronic kidney disease.;Long Term: Maintenance of blood pressure at goal levels.   Lipids Yes   Intervention Provide education and support for participant on nutrition & aerobic/resistive exercise  along with prescribed medications to achieve LDL <37m, HDL >452m   Expected Outcomes Short Term: Participant states understanding of desired cholesterol values and is compliant with medications prescribed. Participant is following exercise prescription and nutrition guidelines.;Long Term: Cholesterol controlled with medications as prescribed, with individualized  exercise RX and with personalized nutrition plan. Value goals: LDL < 7038mHDL > 40 mg.   Personal Goal Other Yes   Personal Goal short: feel better, improve SOB and lose wt (5-10)  long: lose wt (50lbs), stronger heart and increase confidence   Intervention Provide nutrition education and exercise programming to improve cardiovascular fitness and confidence   Expected Outcomes Pt wil have improved SOB with activities, increased confidence and feel better      Core Components/Risk Factors/Patient Goals Review:      Goals and Risk Factor Review    Row Name 07/15/16 1553             Core Components/Risk Factors/Patient Goals Review   Personal Goals Review Increase Strength and Stamina       Review Pt stated that she feel stronger and has noticed an improvement in energy levels.       Expected Outcomes Pt will continue to progress in exercises and show improvement in cardiovascular fitness levels          Core Components/Risk Factors/Patient Goals at Discharge (Final Review):      Goals and Risk Factor Review - 07/15/16 1553      Core Components/Risk Factors/Patient Goals Review   Personal Goals Review Increase Strength and Stamina   Review Pt stated that she feel stronger and has noticed an improvement in energy levels.   Expected Outcomes Pt will continue to progress in exercises and show improvement in cardiovascular fitness levels      ITP Comments:     ITP Comments    Row Name 06/02/16 0859           ITP Comments Dr. TraFransico Himedical Director          Comments: Pt is making expected progress toward personal goals after completing 14  sessions. Recommend continued exercise and life style modification education including  stress management and relaxation techniques to decrease cardiac risk profile.

## 2016-07-18 ENCOUNTER — Encounter (HOSPITAL_COMMUNITY)
Admission: RE | Admit: 2016-07-18 | Discharge: 2016-07-18 | Disposition: A | Discharge status: Home / Self Care | Payer: Medicare Other | Source: Ambulatory Visit | Attending: Cardiovascular Disease | Admitting: Cardiovascular Disease

## 2016-07-18 DIAGNOSIS — I214 Non-ST elevation (NSTEMI) myocardial infarction: Secondary | ICD-10-CM | POA: Diagnosis not present

## 2016-07-18 DIAGNOSIS — Z955 Presence of coronary angioplasty implant and graft: Secondary | ICD-10-CM

## 2016-07-20 ENCOUNTER — Encounter (HOSPITAL_COMMUNITY)
Admission: RE | Admit: 2016-07-20 | Discharge: 2016-07-20 | Disposition: A | Discharge status: Home / Self Care | Payer: Medicare Other | Source: Ambulatory Visit | Attending: Cardiovascular Disease | Admitting: Cardiovascular Disease

## 2016-07-20 DIAGNOSIS — I214 Non-ST elevation (NSTEMI) myocardial infarction: Secondary | ICD-10-CM

## 2016-07-20 DIAGNOSIS — Z955 Presence of coronary angioplasty implant and graft: Secondary | ICD-10-CM

## 2016-07-22 ENCOUNTER — Encounter (HOSPITAL_COMMUNITY)
Admission: RE | Admit: 2016-07-22 | Discharge: 2016-07-22 | Disposition: A | Discharge status: Home / Self Care | Payer: Medicare Other | Source: Ambulatory Visit | Attending: Cardiovascular Disease | Admitting: Cardiovascular Disease

## 2016-07-22 DIAGNOSIS — I214 Non-ST elevation (NSTEMI) myocardial infarction: Secondary | ICD-10-CM

## 2016-07-22 DIAGNOSIS — Z955 Presence of coronary angioplasty implant and graft: Secondary | ICD-10-CM

## 2016-07-25 ENCOUNTER — Encounter (HOSPITAL_COMMUNITY)
Admission: RE | Admit: 2016-07-25 | Discharge: 2016-07-25 | Disposition: A | Discharge status: Home / Self Care | Payer: Medicare Other | Source: Ambulatory Visit | Attending: Cardiovascular Disease | Admitting: Cardiovascular Disease

## 2016-07-25 DIAGNOSIS — I214 Non-ST elevation (NSTEMI) myocardial infarction: Secondary | ICD-10-CM | POA: Diagnosis not present

## 2016-07-25 DIAGNOSIS — Z955 Presence of coronary angioplasty implant and graft: Secondary | ICD-10-CM

## 2016-07-27 ENCOUNTER — Encounter (HOSPITAL_COMMUNITY)
Admission: RE | Admit: 2016-07-27 | Discharge: 2016-07-27 | Disposition: A | Discharge status: Home / Self Care | Payer: Medicare Other | Source: Ambulatory Visit | Attending: Cardiovascular Disease | Admitting: Cardiovascular Disease

## 2016-07-27 DIAGNOSIS — Z955 Presence of coronary angioplasty implant and graft: Secondary | ICD-10-CM

## 2016-07-27 DIAGNOSIS — I214 Non-ST elevation (NSTEMI) myocardial infarction: Secondary | ICD-10-CM | POA: Diagnosis not present

## 2016-07-29 ENCOUNTER — Encounter (HOSPITAL_COMMUNITY)
Admission: RE | Admit: 2016-07-29 | Discharge: 2016-07-29 | Disposition: A | Discharge status: Home / Self Care | Payer: Medicare Other | Source: Ambulatory Visit | Attending: Cardiovascular Disease | Admitting: Cardiovascular Disease

## 2016-07-29 ENCOUNTER — Encounter (HOSPITAL_COMMUNITY): Payer: Self-pay | Admitting: Emergency Medicine

## 2016-07-29 ENCOUNTER — Emergency Department (HOSPITAL_COMMUNITY)
Admission: EM | Admit: 2016-07-29 | Discharge: 2016-07-30 | Disposition: A | Discharge status: Home / Self Care | Payer: Medicare Other | Attending: Emergency Medicine | Admitting: Emergency Medicine

## 2016-07-29 DIAGNOSIS — J45909 Unspecified asthma, uncomplicated: Secondary | ICD-10-CM | POA: Diagnosis not present

## 2016-07-29 DIAGNOSIS — I110 Hypertensive heart disease with heart failure: Secondary | ICD-10-CM | POA: Diagnosis not present

## 2016-07-29 DIAGNOSIS — I2510 Atherosclerotic heart disease of native coronary artery without angina pectoris: Secondary | ICD-10-CM | POA: Diagnosis not present

## 2016-07-29 DIAGNOSIS — I5032 Chronic diastolic (congestive) heart failure: Secondary | ICD-10-CM | POA: Insufficient documentation

## 2016-07-29 DIAGNOSIS — R040 Epistaxis: Secondary | ICD-10-CM | POA: Insufficient documentation

## 2016-07-29 DIAGNOSIS — Z955 Presence of coronary angioplasty implant and graft: Secondary | ICD-10-CM

## 2016-07-29 DIAGNOSIS — I252 Old myocardial infarction: Secondary | ICD-10-CM | POA: Diagnosis not present

## 2016-07-29 DIAGNOSIS — I214 Non-ST elevation (NSTEMI) myocardial infarction: Secondary | ICD-10-CM

## 2016-07-29 LAB — BASIC METABOLIC PANEL
ANION GAP: 11 (ref 5–15)
BUN: 28 mg/dL — ABNORMAL HIGH (ref 6–20)
CALCIUM: 9.4 mg/dL (ref 8.9–10.3)
CHLORIDE: 104 mmol/L (ref 101–111)
CO2: 20 mmol/L — AB (ref 22–32)
Creatinine, Ser: 1.63 mg/dL — ABNORMAL HIGH (ref 0.44–1.00)
GFR calc non Af Amer: 30 mL/min — ABNORMAL LOW (ref >60)
GFR, EST AFRICAN AMERICAN: 35 mL/min — AB (ref >60)
Glucose, Bld: 130 mg/dL — ABNORMAL HIGH (ref 65–99)
Potassium: 3.7 mmol/L (ref 3.5–5.1)
SODIUM: 135 mmol/L (ref 135–145)

## 2016-07-29 LAB — CBC WITH DIFFERENTIAL/PLATELET
BASOS ABS: 0 10*3/uL (ref 0.0–0.1)
BASOS PCT: 0 %
Eosinophils Absolute: 0.2 10*3/uL (ref 0.0–0.7)
Eosinophils Relative: 2 %
HEMATOCRIT: 32.2 % — AB (ref 36.0–46.0)
HEMOGLOBIN: 10.1 g/dL — AB (ref 12.0–15.0)
Lymphocytes Relative: 33 %
Lymphs Abs: 3.8 10*3/uL (ref 0.7–4.0)
MCH: 28.5 pg (ref 26.0–34.0)
MCHC: 31.4 g/dL (ref 30.0–36.0)
MCV: 90.7 fL (ref 78.0–100.0)
Monocytes Absolute: 0.9 10*3/uL (ref 0.1–1.0)
Monocytes Relative: 8 %
NEUTROS ABS: 6.7 10*3/uL (ref 1.7–7.7)
NEUTROS PCT: 57 %
Platelets: 345 10*3/uL (ref 150–400)
RBC: 3.55 MIL/uL — AB (ref 3.87–5.11)
RDW: 13.7 % (ref 11.5–15.5)
WBC: 11.6 10*3/uL — AB (ref 4.0–10.5)

## 2016-07-29 LAB — PROTIME-INR
INR: 1.02
PROTHROMBIN TIME: 13.4 s (ref 11.4–15.2)

## 2016-07-29 MED ORDER — OXYMETAZOLINE HCL 0.05 % NA SOLN
1.0000 | Freq: Once | NASAL | Status: AC
  Administered 2016-07-29: 1 via NASAL
  Filled 2016-07-29: qty 15

## 2016-07-29 MED ORDER — DOXYCYCLINE HYCLATE 100 MG PO CAPS: 100.0000 mg | ORAL_CAPSULE | Freq: Two times a day (BID) | ORAL | 0 refills | Status: AC

## 2016-07-29 NOTE — ED Provider Notes (Signed)
[facility] DEPT Provider Note   CSN: [id number] Arrival date & time: 07/29/16  2107     History   Chief Complaint Chief Complaint  Patient presents with  . Epistaxis    HPI Donna Williamson is a 70 y.o. female.  Patient has been having nosebleeds intermittently for the past month after starting Brilinta for cardiac stent placed in July.  Seen by ENT and had bilateral cautery.  Mild, short duration bleeding since then until today.  Today moderate bleeding not responsive to pressure.   The history is provided by the patient.  Epistaxis   This is a recurrent problem. The current episode started 3 to 5 hours ago. The problem occurs constantly. The problem has not changed since onset.The problem is associated with anticoagulants (Brilinta). The bleeding has been from both nares. She has tried applying pressure for the symptoms. The treatment provided no relief.    Past Medical History:  Diagnosis Date  . Anemia   . Anxiety   . Arthritis    "feet, knees, back" (05/02/2016)  . Asthma   . CAD (coronary artery disease)    a. 04/2016: NSTEMI 95% stenosis 1st Mrg (s/p DES)  . Chest pain    a. 10/2013 Exercise Myoview: Ef 65%, no ischemia.  . Chronic bronchitis ([place])   . Chronic lower back pain   . GERD (gastroesophageal reflux disease)   . History of blood transfusion 1960s   "related to my hysterectomy"  . Hypertension   . Mitral valve prolapse   . NSTEMI (non-ST elevated myocardial infarction) ([place]) 05/01/2016  . Pneumonia 05/01/2016   "saw trace of slight pneumonia/CT scan" (05/02/2016)  . PONV (postoperative nausea and vomiting)   . Seasonal allergies   . Sjogren's syndrome [facility])     Patient Active Problem List   Diagnosis Date Noted  . Coronary artery disease involving native coronary artery of native heart without angina pectoris 05/09/2016  . Stented coronary artery   . NSTEMI (non-ST elevated myocardial infarction) ([place]) 05/02/2016  . Chronic diastolic CHF  (congestive heart failure) ([employer])   . Hyperglycemia 05/01/2016  . Anemia 05/01/2016  . Anxiety 05/01/2016  . Depression 05/01/2016  . Asthma exacerbation   . Porokeratosis 09/21/2015  . Chest pain 11/29/2013  . HTN (hypertension) 11/29/2013  . Hyperlipidemia 11/29/2013  . GERD (gastroesophageal reflux disease) 11/29/2013    Past Surgical History:  Procedure Laterality Date  . ABDOMINAL HYSTERECTOMY  1960s   "partial"  . CARDIAC CATHETERIZATION N/A 05/02/2016   Procedure: Right/Left Heart Cath and Coronary Angiography;  Surgeon: Jettie Booze, MD;  Location: [facility] CV LAB;  Service: Cardiovascular;  Laterality: N/A;  . CARDIAC CATHETERIZATION N/A 05/02/2016   Procedure: Coronary Stent Intervention;  Surgeon: Jettie Booze, MD;  Location: [place] [facility];  Service: Cardiovascular;  Laterality: N/A;  . COLONOSCOPY W/ BIOPSIES AND POLYPECTOMY    . CORONARY ANGIOPLASTY WITH STENT PLACEMENT  05/02/2016   "1 stent"  . ESOPHAGOGASTRODUODENOSCOPY    . JOINT REPLACEMENT    . LAPAROSCOPIC SALPINGOOPHERECTOMY Bilateral ~ 1990  . TOTAL HIP ARTHROPLASTY Left 2007    OB History    No data available       Home Medications    Prior to Admission medications   Medication Sig Start Date End Date Taking? Authorizing Provider  albuterol (PROVENTIL) (2.5 MG/3ML) 0.083% nebulizer solution Take 3 mLs (2.5 mg total) by nebulization every 2 (two) hours as needed for wheezing or shortness of breath. 05/04/16   Vijaya  Karleen Hampshire, MD  ALPRAZolam Duanne Moron) 0.5 MG tablet Take 0.5 mg by mouth daily as needed. For anxiety    Historical Provider, MD  AMBULATORY NON FORMULARY MEDICATION Take 90 mg by mouth 2 (two) times daily. Medication Name: BRILINTA 90 mg BID (TWILIGHT RESEARCH STUDY PROVIDED  DO NOT FILL) 05/04/16   Burnell Blanks, MD  AMBULATORY NON FORMULARY MEDICATION Take 81 mg by mouth daily. Medication Name: ASPIRIN 81 mg Daily TWILIGHT Research study PROVIDED 05/04/16   Burnell Blanks, MD  Cholecalciferol (VITAMIN D3) 2000 UNITS TABS Take 1 tablet by mouth daily.    Historical Provider, MD  cyanocobalamin 500 MCG tablet Take 500 mcg by mouth daily.    Historical Provider, MD  diltiazem (CARDIZEM CD) 240 MG 24 hr capsule Take 240 capsules by mouth daily. 11/27/13   Historical Provider, MD  EPIPEN 2-PAK 0.3 MG/0.3ML SOAJ injection Inject 0.3 mg into the skin as needed. 11/01/13   Historical Provider, MD  estradiol (ESTRACE) 1 MG tablet Take 1 mg by mouth daily.    Historical Provider, MD  FLUoxetine (PROZAC) 10 MG tablet Take 10 mg by mouth daily.    Historical Provider, MD  fluticasone (FLONASE) 50 MCG/ACT nasal spray Place 2 sprays into the nose daily.    Historical Provider, MD  Fluticasone-Salmeterol (ADVAIR) 100-50 MCG/DOSE AEPB Inhale 1 puff into the lungs every 12 (twelve) hours.    Historical Provider, MD  furosemide (LASIX) 20 MG tablet Take 1 tablet (20 mg total) by mouth daily. Patient taking differently: Take 20 mg by mouth every other day.  05/04/16   Hosie Poisson, MD  gabapentin (NEURONTIN) 100 MG capsule Take 100 mg by mouth daily. Supposed to go up to 300 mg    Historical Provider, MD  isosorbide mononitrate (IMDUR) 30 MG 24 hr tablet Take 1 tablet (30 mg total) by mouth daily. 05/30/16   Bhavinkumar Bhagat, PA  metoprolol tartrate (LOPRESSOR) 25 MG tablet Take 0.5 tablets (12.5 mg total) by mouth 2 (two) times daily. 05/10/16   Bhavinkumar Bhagat, PA  montelukast (SINGULAIR) 10 MG tablet Take 10 mg by mouth at bedtime.    Historical Provider, MD  Multiple Vitamin (MULTIVITAMIN WITH MINERALS) TABS tablet Take 1 tablet by mouth daily.    Historical Provider, MD  mupirocin ointment (BACTROBAN) 2 % Apply 1 application topically daily as needed (skin irritation).  03/29/16   Historical Provider, MD  nitroGLYCERIN (NITROSTAT) 0.4 MG SL tablet Place 1 tablet (0.4 mg total) under the tongue every 5 (five) minutes as needed for chest pain. 05/06/16   Dorothy Spark, MD    olmesartan (BENICAR) 20 MG tablet Take 1 tablet (20 mg total) by mouth daily. 05/12/16   Deboraha Sprang, MD  omeprazole (PRILOSEC) 40 MG capsule Take 40 mg by mouth daily.    Historical Provider, MD  PROAIR HFA 108 (90 BASE) MCG/ACT inhaler Inhale 1-2 puffs into the lungs every 4 (four) hours as needed. 11/01/13   Historical Provider, MD  rosuvastatin (CRESTOR) 20 MG tablet Take 1 tablet (20 mg total) by mouth daily at 6 PM. 05/30/16   Bhavinkumar Bhagat, PA  triamcinolone cream (KENALOG) 0.1 % Apply 1 application topically 2 (two) times daily. 04/02/16   [name], MD    Family History Family History  Problem Relation Age of Onset  . Hypertension Mother   . Stroke Mother   . Heart disease Father   . Ovarian cancer Sister     Social History Social History  Substance Use Topics  . Smoking status: Never Smoker  . Smokeless tobacco: Never Used  . Alcohol use No     Allergies   Levaquin [levofloxacin]; Codeine; Macrobid [nitrofurantoin]; Ciprofloxacin; Penicillins; and Sulfa antibiotics   Review of Systems Review of Systems  Constitutional: Negative for chills and fever.  HENT: Positive for nosebleeds. Negative for ear pain and sore throat.   Eyes: Negative for pain and visual disturbance.  Respiratory: Negative for cough and shortness of breath.   Cardiovascular: Negative for chest pain and palpitations.  Gastrointestinal: Negative for abdominal pain and vomiting.  Genitourinary: Negative for dysuria and hematuria.  Musculoskeletal: Negative for arthralgias and back pain.  Skin: Negative for color change and rash.  Neurological: Negative for seizures and syncope.  All other systems reviewed and are negative.    Physical Exam Updated Vital Signs BP 135/64 (BP Location: Right Arm)   Pulse 80   Temp 97.7 F (36.5 C) (Oral)   SpO2 97%   Physical Exam  Constitutional: She is oriented to person, place, and time. She appears well-developed and well-nourished. No  distress.  HENT:  Head: Normocephalic and atraumatic.  Moderately bleeding from bilateral nares.  Appears likely anterior, though not completely visualized.  Eyes: Conjunctivae and EOM are normal. Pupils are equal, round, and reactive to light.  Neck: Normal range of motion. Neck supple.  Cardiovascular: Normal rate and regular rhythm.   Pulmonary/Chest: Effort normal and breath sounds normal. No respiratory distress.  Abdominal: Soft. There is no tenderness.  Musculoskeletal: She exhibits no edema.  Neurological: She is alert and oriented to person, place, and time.  Skin: Skin is warm and dry.  Psychiatric: She has a normal mood and affect.  Nursing note and vitals reviewed.    ED Treatments / Results  Labs (all labs ordered are listed, but only abnormal results are displayed) Labs Reviewed  CBC WITH DIFFERENTIAL/PLATELET - Abnormal; Notable for the following:       Result Value   WBC 11.6 (*)    RBC 3.55 (*)    Hemoglobin 10.1 (*)    HCT 32.2 (*)    All other components within normal limits  BASIC METABOLIC PANEL - Abnormal; Notable for the following:    CO2 20 (*)    Glucose, Bld 130 (*)    BUN 28 (*)    Creatinine, Ser 1.63 (*)    GFR calc non Af Amer 30 (*)    GFR calc Af Amer 35 (*)    All other components within normal limits  PROTIME-INR    EKG  EKG Interpretation None       Radiology No results found.  Procedures .Epistaxis Management Date/Time: 07/29/2016 9:48 PM Performed by: Elveria Rising Authorized by: Lilian Coma E   Consent:    Consent obtained:  Verbal   Consent given by:  Patient   Risks discussed:  Bleeding, infection and pain Anesthesia (see MAR for exact dosages):    Anesthesia method:  None Procedure details:    Treatment site:  L anterior and R anterior   Treatment method:  Nasal balloon   Treatment complexity:  Limited   Treatment episode: recurring   Post-procedure details:    Assessment:  Bleeding stopped   Patient  tolerance of procedure:  Tolerated well, no immediate complications   (including critical care time)  Medications Ordered in ED Medications  oxymetazoline (AFRIN) 0.05 % nasal spray 1 spray (1 spray Each Nare Given 07/29/16 2227)     Initial  Impression / Assessment and Plan / ED Course  I have reviewed the triage vital signs and the nursing notes.  Pertinent labs & imaging results that were available during my care of the patient were reviewed by me and considered in my medical decision making (see chart for details).  Clinical Course    Ms. Homsher is a 70 year old female with past medical history significant for coronary artery disease with stent placement in July, Sjogren's syndrome, anemia, asthma who presents for acute nosebleed.  The patient was recently treated for the same by ENT where cautery was performed in the office.  Patient is on Brilinta due to recent stent placement.  Bleeding is found to arise from both nares.  It appears to be anterior.  The patient was holding pressure with no relief.  Labs ordered including CBC, BMP, coags.  Results significant for mild anemia, leukocytosis, mild acidosis.  No prior labs to compare with.  Afrin was administered in a pressure holding device was created with tongue depressor's.  After approximately 20 minutes, the patient still had no decrease in bleeding.  2 quick Rhino devices were placed, as detailed above.  Bleeding was stopped.  Patient is discharged with quick Rhino in place, given antibiotics.  Strict return precautions provided as well as follow-up instructions and educational material.   Final Clinical Impressions(s) / ED Diagnoses   Final diagnoses:  Epistaxis    New Prescriptions Discharge Medication List as of 07/29/2016 11:44 PM    START taking these medications   Details  doxycycline (VIBRAMYCIN) 100 MG capsule Take 1 capsule (100 mg total) by mouth 2 (two) times daily., Starting Fri 07/29/2016, Until Wed 08/03/2016,  Print         Elveria Rising, MD 07/30/16 1119    Elveria Rising, MD 07/30/16 [place], MD 08/01/16 1005

## 2016-07-29 NOTE — ED Triage Notes (Signed)
Pt was sitting at home and nose started bleeding around 1900. [place] trail [place]. Hypertensive for EMS

## 2016-07-30 ENCOUNTER — Encounter (HOSPITAL_COMMUNITY): Payer: Self-pay | Admitting: Emergency Medicine

## 2016-07-30 ENCOUNTER — Telehealth: Payer: Self-pay | Admitting: Physician Assistant

## 2016-07-30 ENCOUNTER — Emergency Department (HOSPITAL_COMMUNITY)
Admission: EM | Admit: 2016-07-30 | Discharge: 2016-07-30 | Disposition: A | Discharge status: Home / Self Care | Payer: Medicare Other | Source: Home / Self Care | Attending: Emergency Medicine | Admitting: Emergency Medicine

## 2016-07-30 DIAGNOSIS — I5032 Chronic diastolic (congestive) heart failure: Secondary | ICD-10-CM | POA: Insufficient documentation

## 2016-07-30 DIAGNOSIS — R040 Epistaxis: Secondary | ICD-10-CM | POA: Insufficient documentation

## 2016-07-30 DIAGNOSIS — Z96642 Presence of left artificial hip joint: Secondary | ICD-10-CM | POA: Insufficient documentation

## 2016-07-30 DIAGNOSIS — I110 Hypertensive heart disease with heart failure: Secondary | ICD-10-CM

## 2016-07-30 DIAGNOSIS — I252 Old myocardial infarction: Secondary | ICD-10-CM

## 2016-07-30 DIAGNOSIS — J45909 Unspecified asthma, uncomplicated: Secondary | ICD-10-CM

## 2016-07-30 DIAGNOSIS — I2510 Atherosclerotic heart disease of native coronary artery without angina pectoris: Secondary | ICD-10-CM | POA: Insufficient documentation

## 2016-07-30 DIAGNOSIS — Z955 Presence of coronary angioplasty implant and graft: Secondary | ICD-10-CM | POA: Insufficient documentation

## 2016-07-30 LAB — CBC
HCT: 29.9 % — ABNORMAL LOW (ref 36.0–46.0)
HEMOGLOBIN: 9.6 g/dL — AB (ref 12.0–15.0)
MCH: 29 pg (ref 26.0–34.0)
MCHC: 32.1 g/dL (ref 30.0–36.0)
MCV: 90.3 fL (ref 78.0–100.0)
Platelets: 296 10*3/uL (ref 150–400)
RBC: 3.31 MIL/uL — AB (ref 3.87–5.11)
RDW: 13.9 % (ref 11.5–15.5)
WBC: 9.8 10*3/uL (ref 4.0–10.5)

## 2016-07-30 NOTE — ED Provider Notes (Signed)
[facility] DEPT Provider Note   CSN: [id number] Arrival date & time: 07/30/16  0606     History   Chief Complaint Chief Complaint  Patient presents with  . Epistaxis    HPI Donna Williamson is a 70 y.o. female.  Pt has a history of cardiac stent.  She is currently taking brilinta.   Since then she has had intermittent nosebleeds.  Usually they will stop on their own.  She has seen Dr Benjamine Mola as an outpatient and has had nasal cautery.  Pt started with a nosebleed yesterday that would not stop.  She was treated in the ED last night with bilateral nasal packing (ballon device).  Last night she slept sitting up.  When she woke up this am she noticed some oozing around the packing on the right.  She was concerned it was starting to bleed again.  No drainage in her throat    Epistaxis   This is a recurrent problem. The current episode started yesterday. The problem has been gradually improving. The bleeding has been from the right nare. She has tried a nasal tampon and vasoconstrictors for the symptoms. The treatment provided significant relief. Her past medical history is significant for frequent nosebleeds.    Past Medical History:  Diagnosis Date  . Anemia   . Anxiety   . Arthritis    "feet, knees, back" (05/02/2016)  . Asthma   . CAD (coronary artery disease)    a. 04/2016: NSTEMI 95% stenosis 1st Mrg (s/p DES)  . Chest pain    a. 10/2013 Exercise Myoview: Ef 65%, no ischemia.  . Chronic bronchitis ([place])   . Chronic lower back pain   . GERD (gastroesophageal reflux disease)   . History of blood transfusion 1960s   "related to my hysterectomy"  . Hypertension   . Mitral valve prolapse   . NSTEMI (non-ST elevated myocardial infarction) ([place]) 05/01/2016  . Pneumonia 05/01/2016   "saw trace of slight pneumonia/CT scan" (05/02/2016)  . PONV (postoperative nausea and vomiting)   . Seasonal allergies   . Sjogren's syndrome [facility])     Patient Active Problem List   Diagnosis Date  Noted  . Coronary artery disease involving native coronary artery of native heart without angina pectoris 05/09/2016  . Stented coronary artery   . NSTEMI (non-ST elevated myocardial infarction) ([place]) 05/02/2016  . Chronic diastolic CHF (congestive heart failure) ([place])   . Hyperglycemia 05/01/2016  . Anemia 05/01/2016  . Anxiety 05/01/2016  . Depression 05/01/2016  . Asthma exacerbation   . Porokeratosis 09/21/2015  . Chest pain 11/29/2013  . HTN (hypertension) 11/29/2013  . Hyperlipidemia 11/29/2013  . GERD (gastroesophageal reflux disease) 11/29/2013    Past Surgical History:  Procedure Laterality Date  . ABDOMINAL HYSTERECTOMY  1960s   "partial"  . CARDIAC CATHETERIZATION N/A 05/02/2016   Procedure: Right/Left Heart Cath and Coronary Angiography;  Surgeon: Jettie Booze, MD;  Location: [facility];  Service: Cardiovascular;  Laterality: N/A;  . CARDIAC CATHETERIZATION N/A 05/02/2016   Procedure: Coronary Stent Intervention;  Surgeon: Jettie Booze, MD;  Location: [facility];  Service: Cardiovascular;  Laterality: N/A;  . COLONOSCOPY W/ BIOPSIES AND POLYPECTOMY    . CORONARY ANGIOPLASTY WITH STENT PLACEMENT  05/02/2016   "1 stent"  . ESOPHAGOGASTRODUODENOSCOPY    . JOINT REPLACEMENT    . LAPAROSCOPIC SALPINGOOPHERECTOMY Bilateral ~ 1990  . TOTAL HIP ARTHROPLASTY Left 2007    OB History    No data available  Home Medications    Prior to Admission medications   Medication Sig Start Date End Date Taking? Authorizing Provider  albuterol (PROVENTIL) (2.5 MG/3ML) 0.083% nebulizer solution Take 3 mLs (2.5 mg total) by nebulization every 2 (two) hours as needed for wheezing or shortness of breath. 05/04/16   Hosie Poisson, MD  ALPRAZolam Duanne Moron) 0.5 MG tablet Take 0.5 mg by mouth daily as needed for anxiety.     Historical Provider, MD  AMBULATORY NON FORMULARY MEDICATION Take 90 mg by mouth 2 (two) times daily. Medication Name: BRILINTA 90 mg BID  (TWILIGHT RESEARCH STUDY PROVIDED  DO NOT FILL) 05/04/16   Burnell Blanks, MD  AMBULATORY NON FORMULARY MEDICATION Take 81 mg by mouth daily. Medication Name: ASPIRIN 81 mg Daily TWILIGHT Research study PROVIDED 05/04/16   Burnell Blanks, MD  Cholecalciferol (VITAMIN D3) 2000 UNITS TABS Take 1 tablet by mouth daily.    Historical Provider, MD  cyanocobalamin 500 MCG tablet Take 500 mcg by mouth daily.    Historical Provider, MD  diltiazem (CARDIZEM CD) 240 MG 24 hr capsule Take 240 capsules by mouth daily. 11/27/13   Historical Provider, MD  doxycycline (VIBRAMYCIN) 100 MG capsule Take 1 capsule (100 mg total) by mouth 2 (two) times daily. 07/29/16 08/03/16  Elveria Rising, MD  EPIPEN 2-PAK 0.3 MG/0.3ML SOAJ injection Inject 0.3 mg into the skin daily as needed (allergic reaction).  11/01/13   Historical Provider, MD  estradiol (ESTRACE) 1 MG tablet Take 1 mg by mouth daily.    Historical Provider, MD  FLUoxetine (PROZAC) 10 MG tablet Take 10 mg by mouth daily.    Historical Provider, MD  fluticasone (FLONASE) 50 MCG/ACT nasal spray Place 2 sprays into the nose daily.    Historical Provider, MD  Fluticasone-Salmeterol (ADVAIR) 100-50 MCG/DOSE AEPB Inhale 1 puff into the lungs every 12 (twelve) hours.    Historical Provider, MD  furosemide (LASIX) 20 MG tablet Take 1 tablet (20 mg total) by mouth daily. Patient taking differently: Take 20 mg by mouth every other day.  05/04/16   Hosie Poisson, MD  gabapentin (NEURONTIN) 100 MG capsule Take 100 mg by mouth daily. Supposed to go up to 300 mg    Historical Provider, MD  isosorbide mononitrate (IMDUR) 30 MG 24 hr tablet Take 1 tablet (30 mg total) by mouth daily. 05/30/16   Bhavinkumar Bhagat, PA  metoprolol tartrate (LOPRESSOR) 25 MG tablet Take 0.5 tablets (12.5 mg total) by mouth 2 (two) times daily. 05/10/16   Bhavinkumar Bhagat, PA  montelukast (SINGULAIR) 10 MG tablet Take 10 mg by mouth at bedtime.    Historical Provider, MD  Multiple Vitamin  (MULTIVITAMIN WITH MINERALS) TABS tablet Take 1 tablet by mouth daily.    Historical Provider, MD  mupirocin ointment (BACTROBAN) 2 % Apply 1 application topically daily as needed (skin irritation).  03/29/16   Historical Provider, MD  nitroGLYCERIN (NITROSTAT) 0.4 MG SL tablet Place 1 tablet (0.4 mg total) under the tongue every 5 (five) minutes as needed for chest pain. 05/06/16   Dorothy Spark, MD  olmesartan (BENICAR) 20 MG tablet Take 1 tablet (20 mg total) by mouth daily. 05/12/16   Deboraha Sprang, MD  Olopatadine HCl (PATADAY) 0.2 % SOLN Place 1-2 drops into both eyes daily as needed (allergies).    Historical Provider, MD  omeprazole (PRILOSEC) 40 MG capsule Take 40 mg by mouth daily.    Historical Provider, MD  PROAIR HFA 108 (90 BASE) MCG/ACT inhaler Inhale 1-2  puffs into the lungs every 4 (four) hours as needed for wheezing.  11/01/13   Historical Provider, MD  rosuvastatin (CRESTOR) 20 MG tablet Take 1 tablet (20 mg total) by mouth daily at 6 PM. 05/30/16   Bhavinkumar Bhagat, PA  triamcinolone cream (KENALOG) 0.1 % Apply 1 application topically 2 (two) times daily. Patient taking differently: Apply 1 application topically 2 (two) times daily as needed (rash).  04/02/16   [name], MD    Family History Family History  Problem Relation Age of Onset  . Hypertension Mother   . Stroke Mother   . Heart disease Father   . Ovarian cancer Sister     Social History Social History  Substance Use Topics  . Smoking status: Never Smoker  . Smokeless tobacco: Never Used  . Alcohol use No     Allergies   Levaquin [levofloxacin]; Codeine; Macrobid [nitrofurantoin]; Ciprofloxacin; Penicillins; and Sulfa antibiotics   Review of Systems Review of Systems  HENT: Positive for nosebleeds.   All other systems reviewed and are negative.    Physical Exam Updated Vital Signs BP 156/66   Pulse 70   Temp 97.7 F (36.5 C) (Oral)   Resp 18   Ht 5' 5.5" (1.664 m)   Wt 97.5 kg   SpO2  95%   BMI 35.23 kg/m   Physical Exam  Constitutional: No distress.  HENT:  Head: Normocephalic and atraumatic.  Right Ear: External ear normal.  Left Ear: External ear normal.  Mouth/Throat: No oropharyngeal exudate.  No blood in the posterior pharynx, bilateral nasal tampons, right side is red colored from blood, no blood oozing around the device, left side device is still white   Eyes: Conjunctivae are normal. Right eye exhibits no discharge. Left eye exhibits no discharge. No scleral icterus.  Neck: Neck supple. No tracheal deviation present.  Cardiovascular: Normal rate and regular rhythm.   Pulmonary/Chest: Effort normal. No stridor. No respiratory distress.  Abdominal: She exhibits no distension.  Musculoskeletal: She exhibits no edema.  Neurological: She is alert. Cranial nerve deficit: no gross deficits.  Skin: Skin is warm and dry. No rash noted. She is not diaphoretic.  Psychiatric: She has a normal mood and affect.  Nursing note and vitals reviewed.    ED Treatments / Results  Labs (all labs ordered are listed, but only abnormal results are displayed) Labs Reviewed  CBC - Abnormal; Notable for the following:       Result Value   RBC 3.31 (*)    Hemoglobin 9.6 (*)    HCT 29.9 (*)    All other components within normal limits    Procedures Procedures (including critical care time)   Initial Impression / Assessment and Plan / ED Course  I have reviewed the triage vital signs and the nursing notes.  Pertinent labs & imaging results that were available during my care of the patient were reviewed by me and considered in my medical decision making (see chart for details).  Clinical Course No further bleeding in the ED.  Hemoglobin not significantly changed.  With the bleeding stopped  I hesitate removing the packing to recheck.  It seems to be right sided bleeding.    Will have pt follow up with her ENT doctor.   Return PRN. Final Clinical Impressions(s) / ED  Diagnoses   Final diagnoses:  Epistaxis, recurrent    New Prescriptions New Prescriptions   No medications on file     Dorie Rank, MD 07/30/16 [phone]

## 2016-07-30 NOTE — ED Triage Notes (Signed)
Pt. returned due to right epistaxis recurred this evening , she was seen here last night discharged home after bilateral nasal packing , she is taking Brilinta .

## 2016-07-30 NOTE — Telephone Encounter (Signed)
    Pt has a history of DES placement to [facility] on 05/02/16.  She is currently taking brilinta and ASA and enrolled in the [place] study. Since then she has had intermittent nosebleeds.  Usually they will stop on their own.  She has seen Dr Benjamine Mola (ENT) as an outpatient and had nasal cautery 3 weeks ago.  Pt started with a nosebleed 9/29 PM that would not stop.  She was treated in the ED  with bilateral nasal packing (ballon device). Last night she slept sitting up.  When she woke up this am she noticed some oozing around the packing on the right.  She was concerned it was starting to bleed again. She came back into the ER this AM with continued dripping. Blood counts were stable and they decided not to take it out and repack. Plan was to continue to monitor. Today husband calling and upset because he feels that this was caused by Brilinta.    I told her I don't feel comfortable with her coming off the Brilitna at this time. She is coming up on 3 months after her DES placement where she would be randomized to Brilinta + ASA or Brilnta + placebo. Since she is only dripping blood right now and was seen in the ER this AM and felt to be stable, I have asked them to try to hold out until Monday and get into see their ENT ASAP. I will forward this to Dr. Burt Knack and his nurse to see if he has any other recommendations.   Angelena Form PA-C  [facility]

## 2016-07-30 NOTE — ED Notes (Signed)
Patient is alert and orientedx4.  Patient was explained discharge instructions and they understood them with no questions.  The patient's husband, Shambhavi Sherron is taking the patient home.

## 2016-08-01 ENCOUNTER — Telehealth: Payer: Self-pay | Admitting: Cardiovascular Disease

## 2016-08-01 ENCOUNTER — Telehealth: Payer: Self-pay | Admitting: *Deleted

## 2016-08-01 ENCOUNTER — Encounter (HOSPITAL_COMMUNITY): Admission: RE | Admit: 2016-08-01 | Payer: Medicare Other | Source: Ambulatory Visit

## 2016-08-01 MED ORDER — CLOPIDOGREL BISULFATE 75 MG PO TABS: 75.0000 mg | ORAL_TABLET | Freq: Every day | ORAL | 3 refills | Status: DC

## 2016-08-01 MED ORDER — PANTOPRAZOLE SODIUM 40 MG PO TBEC: 40.0000 mg | DELAYED_RELEASE_TABLET | Freq: Every day | ORAL | 3 refills | Status: DC

## 2016-08-01 NOTE — Telephone Encounter (Signed)
Probably best to switch her to plavix. 75 mg daily. She is 3 months out from her MI and I think a less potent antiplatelet drug will be better tolerated. I would be reluctant to randomize her in the [facility] Protocol with such severe epistaxis. Would not load with plavix, recommend start 75 mg daily.

## 2016-08-01 NOTE — Telephone Encounter (Signed)
Received call from patient stating that she went to ED this weekend [facility] with a pretty significant nose bleed that required packing. She also had a nose bleed 3 weeks earlier that her ENT had to cauterize. She was instructed to hold brilinta and was calling to make sure this was okay. I instructed her to call cardiology office that I could not make that decision that her cardiologist needed to make that call. She continued to take brilinta this weekend. She is scheduled to be randomized in the TWILIGHT study Friday 08/05/16. I told her we would worry about that later she needed to address the nose bleed. Patient verbalized understanding.

## 2016-08-01 NOTE — Telephone Encounter (Signed)
I will forward this message to Dr Burt Knack to review the pt's chart. The pt has multiple entries in her chart over the weekend due to nose bleed.  Will need Dr Burt Knack to address what needs to be done with anti-platelet drug.

## 2016-08-01 NOTE — Telephone Encounter (Signed)
I spoke with the pt and made her aware of Dr Antionette Char recommendation. Rx sent to the pharmacy for plavix 75mg  daily. The pt will continue ASA 81mg  daily. The pt is also taking prilosec and we will switch to pantoprazole 40mg  daily. The pt has a pending appointment with Dr Burt Knack on 08/08/16.

## 2016-08-01 NOTE — Telephone Encounter (Signed)
Patient called research to inform us of the antiplatelet treatment. She questioned her evening dose of brilinta for tonight and wanted to receive guidance. Dr. Burt Knack aware and instructed to HOLD Brilinta pm dose and would hold off on starting Plavix tomorrow until after ENT appointment. Patient verbalized understanding very appreciative. I thanked her for her participation in the [employer].

## 2016-08-01 NOTE — Telephone Encounter (Signed)
Donna Williamson is calling because she went to the ER on Friday night for a severe nose bleed . She is wanting to speak to you about her Brilinta .Marland Kitchen

## 2016-08-03 ENCOUNTER — Encounter (HOSPITAL_COMMUNITY): Payer: Medicare Other

## 2016-08-05 ENCOUNTER — Encounter (HOSPITAL_COMMUNITY): Admission: RE | Admit: 2016-08-05 | Payer: Medicare Other | Source: Ambulatory Visit

## 2016-08-08 ENCOUNTER — Encounter (HOSPITAL_COMMUNITY): Payer: Medicare Other

## 2016-08-08 ENCOUNTER — Ambulatory Visit (INDEPENDENT_AMBULATORY_CARE_PROVIDER_SITE_OTHER): Payer: Medicare Other | Admitting: Cardiovascular Disease

## 2016-08-08 ENCOUNTER — Encounter: Payer: Self-pay | Admitting: Cardiovascular Disease

## 2016-08-08 ENCOUNTER — Ambulatory Visit: Payer: Medicare Other | Admitting: Cardiovascular Disease

## 2016-08-08 VITALS — BP 110/74 | HR 72 | Ht 65.5 in | Wt 217.2 lb

## 2016-08-08 DIAGNOSIS — E7849 Other hyperlipidemia: Secondary | ICD-10-CM

## 2016-08-08 DIAGNOSIS — I10 Essential (primary) hypertension: Secondary | ICD-10-CM

## 2016-08-08 DIAGNOSIS — I2510 Atherosclerotic heart disease of native coronary artery without angina pectoris: Secondary | ICD-10-CM

## 2016-08-08 DIAGNOSIS — E784 Other hyperlipidemia: Secondary | ICD-10-CM

## 2016-08-08 MED ORDER — FUROSEMIDE 20 MG PO TABS: 20.0000 mg | ORAL_TABLET | ORAL | 6 refills | Status: DC

## 2016-08-08 NOTE — Patient Instructions (Addendum)
Medication Instructions:  Your physician has recommended you make the following change in your medication:  1. STOP Isosorbide [place]  Labwork: Your physician recommends that you have a FASTING LIPID and LIVER profile--nothing to eat or drink after midnight, lab opens at 7:30 AM  Testing/Procedures: No new orders.   Follow-Up: Your physician recommends that you schedule a follow-up appointment in: 4-6 WEEKS with Richardson Dopp PA-C for BP follow-up   Any Other Special Instructions Will Be Listed Below (If Applicable).     If you need a refill on your cardiac medications before your next appointment, please call your pharmacy.

## 2016-08-08 NOTE — Progress Notes (Signed)
Cardiology Office Note Date:  08/08/2016   ID:  Mccauley, Mergen 1945-11-08, MRN [id number]  PCP:  Jerlyn Ly, MD  Cardiologist:  Sherren Mocha, MD    Chief Complaint  Patient presents with  . Coronary Artery Disease     History of Present Illness: Donna Williamson is a 70 y.o. female who presents for follow-up of coronary artery disease and diastolic heart failure. The patient was admitted in July 2017 with acute diastolic heart failure in the setting of non-ST elevation infarction. Troponin peaked at 12. Cardiac catheterization demonstrated moderate nonobstructive disease involving the LAD and severe stenosis of the first OM branch of the circumflex. She was treated with PCI of the obtuse marginal branch with a Synergy DES. At the time of cardiac catheterization, she was noted to have moderate pulmonary hypertension and elevated LVEDP, findings consistent with diastolic heart failure. LVEF by echo was normal. She was treated with IV diuretics with clinical improvement.  Her post PCI regimen included aspirin and brilinta. Unfortunately she has had recurrent problems with severe epistaxis and her brilinta has now been changed to Plavix to try to get her on a less potent antiplatelet medication.  The patient is doing better. She does have some episodes of low blood pressure periodically at cardiac rehabilitation. There is associated lightheadedness with this. The only time she has had elevated blood pressure recorded is when EMS came to her house at the time of a bad nosebleed. She denies any recurrence of chest pain. She denies shortness of breath, edema, orthopnea, or PND.   Past Medical History:  Diagnosis Date  . Anemia   . Anxiety   . Arthritis    "feet, knees, back" (05/02/2016)  . Asthma   . CAD (coronary artery disease)    a. 04/2016: NSTEMI 95% stenosis 1st Mrg (s/p DES)  . Chest pain    a. 10/2013 Exercise Myoview: Ef 65%, no ischemia.  . Chronic bronchitis ([place])     . Chronic lower back pain   . GERD (gastroesophageal reflux disease)   . History of blood transfusion 1960s   "related to my hysterectomy"  . Hypertension   . Mitral valve prolapse   . NSTEMI (non-ST elevated myocardial infarction) ([place]) 05/01/2016  . Pneumonia 05/01/2016   "saw trace of slight pneumonia/CT scan" (05/02/2016)  . PONV (postoperative nausea and vomiting)   . Seasonal allergies   . Sjogren's syndrome [facility])     Past Surgical History:  Procedure Laterality Date  . ABDOMINAL HYSTERECTOMY  1960s   "partial"  . CARDIAC CATHETERIZATION N/A 05/02/2016   Procedure: Right/Left Heart Cath and Coronary Angiography;  Surgeon: Jettie Booze, MD;  Location: [place] [facility];  Service: Cardiovascular;  Laterality: N/A;  . CARDIAC CATHETERIZATION N/A 05/02/2016   Procedure: Coronary Stent Intervention;  Surgeon: Jettie Booze, MD;  Location: [place] [facility];  Service: Cardiovascular;  Laterality: N/A;  . COLONOSCOPY W/ BIOPSIES AND POLYPECTOMY    . CORONARY ANGIOPLASTY WITH STENT PLACEMENT  05/02/2016   "1 stent"  . ESOPHAGOGASTRODUODENOSCOPY    . JOINT REPLACEMENT    . LAPAROSCOPIC SALPINGOOPHERECTOMY Bilateral ~ 1990  . TOTAL HIP ARTHROPLASTY Left 2007    Current Outpatient Prescriptions  Medication Sig Dispense Refill  . albuterol (PROVENTIL) (2.5 MG/3ML) 0.083% nebulizer solution Take 3 mLs (2.5 mg total) by nebulization every 2 (two) hours as needed for wheezing or shortness of breath. 75 mL 2  . ALPRAZolam (XANAX) 0.5 MG tablet Take 0.5 mg  by mouth daily as needed for anxiety.     . AMBULATORY NON FORMULARY MEDICATION Take 81 mg by mouth daily. Medication Name: ASPIRIN 81 mg Daily TWILIGHT Research study PROVIDED    . Cholecalciferol (VITAMIN D3) 2000 UNITS TABS Take 1 tablet by mouth daily.    . clopidogrel (PLAVIX) 75 MG tablet Take 1 tablet (75 mg total) by mouth daily. 90 tablet 3  . cyanocobalamin 500 MCG tablet Take 500 mcg by mouth daily.    Marland Kitchen diltiazem  (CARDIZEM CD) 240 MG 24 hr capsule Take 240 mg by mouth daily.     Marland Kitchen EPIPEN 2-PAK 0.3 MG/0.3ML SOAJ injection Inject 0.3 mg into the skin daily as needed (allergic reaction).     Marland Kitchen estradiol (ESTRACE) 1 MG tablet Take 1 mg by mouth daily.    Marland Kitchen FLUoxetine (PROZAC) 10 MG tablet Take 10 mg by mouth daily.    . fluticasone (FLONASE) 50 MCG/ACT nasal spray Place 2 sprays into the nose daily.    . Fluticasone-Salmeterol (ADVAIR) 100-50 MCG/DOSE AEPB Inhale 1 puff into the lungs every 12 (twelve) hours.    . furosemide (LASIX) 20 MG tablet Take 20 mg by mouth every other day.    . gabapentin (NEURONTIN) 100 MG capsule Take 100 mg by mouth daily.     . isosorbide mononitrate (IMDUR) 30 MG 24 hr tablet Take 1 tablet (30 mg total) by mouth daily. 30 tablet 0  . metoprolol tartrate (LOPRESSOR) 25 MG tablet Take 0.5 tablets (12.5 mg total) by mouth 2 (two) times daily. 30 tablet 11  . montelukast (SINGULAIR) 10 MG tablet Take 10 mg by mouth at bedtime.    . Multiple Vitamin (MULTIVITAMIN WITH MINERALS) TABS tablet Take 1 tablet by mouth daily.    . mupirocin ointment (BACTROBAN) 2 % Apply 1 application topically daily as needed (skin irritation).   0  . nitroGLYCERIN (NITROSTAT) 0.4 MG SL tablet Place 1 tablet (0.4 mg total) under the tongue every 5 (five) minutes as needed for chest pain. 25 tablet prn  . olmesartan (BENICAR) 20 MG tablet Take 1 tablet (20 mg total) by mouth daily. 30 tablet 11  . Olopatadine HCl (PATADAY) 0.2 % SOLN Place 1-2 drops into both eyes daily as needed (allergies).    . pantoprazole (PROTONIX) 40 MG tablet Take 1 tablet (40 mg total) by mouth daily. 90 tablet 3  . PROAIR HFA 108 (90 BASE) MCG/ACT inhaler Inhale 1-2 puffs into the lungs every 4 (four) hours as needed for wheezing.     . rosuvastatin (CRESTOR) 20 MG tablet Take 1 tablet (20 mg total) by mouth daily at 6 PM. 30 tablet 0  . triamcinolone cream (KENALOG) 0.1 % Apply 1 application topically 2 (two) times daily as needed  (skin irritation).     No current facility-administered medications for this visit.     Allergies:   Levaquin [levofloxacin]; Codeine; Macrobid [nitrofurantoin]; Ciprofloxacin; Penicillins; and Sulfa antibiotics   Social History:  The patient  reports that she has never smoked. She has never used smokeless tobacco. She reports that she does not drink alcohol or use drugs.   Family History:  The patient's  family history includes Heart disease in her father; Hypertension in her mother; Ovarian cancer in her sister; Stroke in her mother.    ROS:  Please see the history of present illness.  Otherwise, review of systems is positive for epistaxis, fatigue.  All other systems are reviewed and negative.    PHYSICAL EXAM: VS:  BP 110/74   Pulse 72   Ht 5' 5.5" (1.664 m)   Wt 217 lb 3.2 oz (98.5 kg)   BMI 35.59 kg/m  , BMI Body mass index is 35.59 kg/m. GEN: Well nourished, well developed, in no acute distress  HEENT: normal  Neck: no JVD, no masses. No carotid bruits Cardiac: RRR without murmur or gallop                Respiratory:  clear to auscultation bilaterally, normal work of breathing GI: soft, nontender, nondistended, + BS MS: no deformity or atrophy  Ext: no pretibial edema, pedal pulses 2+= bilaterally Skin: warm and dry, no rash Neuro:  Strength and sensation are intact Psych: euthymic mood, full affect  EKG:  EKG is not ordered today.  Recent Labs: 05/01/2016: B Natriuretic Peptide 364.0 05/02/2016: ALT 33 05/03/2016: TSH 0.382 05/04/2016: Magnesium 2.3 07/29/2016: BUN 28; Creatinine, Ser 1.63; Potassium 3.7; Sodium 135 07/30/2016: Hemoglobin 9.6; Platelets 296   Lipid Panel     Component Value Date/Time   CHOL 186 05/03/2016 0423   TRIG 144 05/03/2016 0423   HDL 77 05/03/2016 0423   CHOLHDL 2.4 05/03/2016 0423   VLDL 29 05/03/2016 0423   LDLCALC 80 05/03/2016 0423      Wt Readings from Last 3 Encounters:  08/08/16 217 lb 3.2 oz (98.5 kg)  07/30/16 215 lb (97.5  kg)  06/02/16 224 lb 13.9 oz (102 kg)     Cardiac Studies Reviewed: Echo 05/01/2016: Left ventricle:  The cavity size was normal. Wall thickness was normal. Systolic function was normal. The estimated ejection fraction was in the range of 55% to 60%. Wall motion was normal; there were no regional wall motion abnormalities. The study is not technically sufficient to allow evaluation of LV diastolic function.  ------------------------------------------------------------------- Aortic valve:   Trileaflet; mildly thickened leaflets. Mobility was not restricted.  Doppler:  Transvalvular velocity was within the normal range. There was no stenosis. There was no regurgitation.   ------------------------------------------------------------------- Aorta:  Aortic root: The aortic root was normal in size.  ------------------------------------------------------------------- Mitral valve:   Structurally normal valve.   Mobility was not restricted.  Doppler:  Transvalvular velocity was within the normal range. There was no evidence for stenosis. There was mild regurgitation.    Peak gradient (D): 11 mm Hg.  ------------------------------------------------------------------- Left atrium:  The atrium was normal in size.  ------------------------------------------------------------------- Right ventricle:  The cavity size was normal. Systolic function was normal.  ------------------------------------------------------------------- Pulmonic valve:    Doppler:  Transvalvular velocity was within the normal range. There was no evidence for stenosis.  ------------------------------------------------------------------- Tricuspid valve:   Structurally normal valve.    Doppler: Transvalvular velocity was within the normal range. There was no regurgitation.  ------------------------------------------------------------------- Right atrium:  The atrium was normal in  size.  ------------------------------------------------------------------- Pericardium:  There was no pericardial effusion.  ------------------------------------------------------------------- Systemic veins: Inferior vena cava: The vessel was normal in size.  Cath 05-02-2016: Conclusion    Prox LAD to Mid LAD lesion, 30% stenosed.  Mid LAD lesion, 50% stenosed at bifurcation of Ost 2nd Diag to 2nd Diag lesion, 70% stenosed.  1st Mrg-1 lesion, 95% stenosed. Post intervention with a 2.5 x 16 Synergy drug eluting stent post dilated to 2.8 mm, there is a 0% residual stenosis.  Moderate pulmonary HTN.  Elevated LVEDP. CO 8.7 L/min. CI 3.96.   Continue dual antiplatelet therapy ideally for a year. She does have anemia but has had bleeding source is ruled out. Brilinta started.  She would be a candidate for TWILIGHT study.  She will need some diuresis over the next few days.   Hemo Data   Flowsheet Row Most Recent Value  Fick Cardiac Output 8.71 L/min  Fick Cardiac Output Index 3.96 (L/min)/BSA  RA A Wave 19 mmHg  RA V Wave 18 mmHg  RA Mean 16 mmHg  RV Systolic Pressure 59 mmHg  RV Diastolic Pressure 12 mmHg  RV EDP 16 mmHg  PA Systolic Pressure 56 mmHg  PA Diastolic Pressure 23 mmHg  PA Mean 43 mmHg  PW A Wave 32 mmHg  PW V Wave 49 mmHg  PW Mean 34 mmHg  AO Systolic Pressure Q000111Q mmHg  AO Diastolic Pressure 77 mmHg  AO Mean 0000000 mmHg  LV Systolic Pressure 123456 mmHg  LV Diastolic Pressure 17 mmHg  LV EDP 32 mmHg  Arterial Occlusion Pressure Extended Systolic Pressure Q000111Q mmHg  Arterial Occlusion Pressure Extended Diastolic Pressure 74 mmHg  Arterial Occlusion Pressure Extended Mean Pressure 107 mmHg  Left Ventricular Apex Extended Systolic Pressure 123456 mmHg  Left Ventricular Apex Extended Diastolic Pressure 17 mmHg  Left Ventricular Apex Extended EDP Pressure 33 mmHg  QP/QS 1  TPVR Index 10.86 HRUI  TSVR Index 27.52 HRUI  PVR SVR Ratio 0.1  TPVR/TSVR Ratio 0.39      ASSESSMENT AND PLAN: 1.  CAD, native vessel, without angina: overall stable. Has transitioned to ASA and plavix. No further epistaxis over past week but required cauterization.   2. Chronic diastolic heart failure: NYHA II symptoms. Doing reasonably well with no evidence of volume excess on exam. I'm concerned about her low BP readings and I think we should back off on her medicines. Will stop isosorbide and reassess her clinical response in about 6 weeks.  3. Essential hypertension: see above - stop isosorbide. Appears over-treated.  4. Hyperlipidemia:lipids reviewed from July 2017 with a cholesterol 142, HDL 41, LDL 64. Will update lipids since she was just started on a statin 3 months ago. For ongoing management she will likely be followed by Dr Joylene Draft who does her regular labs.  Current medicines are reviewed with the patient today.  The patient does not have concerns regarding medicines.  Labs/ tests ordered today include:  No orders of the defined types were placed in this encounter.   Disposition:   FU 4-6 weeks with Richardson Dopp, PA-C.   Deatra James, MD  08/08/2016 10:03 AM    [facility] [address] [place], [place], [place]  [address] Phone: [phone]; Fax: [phone]

## 2016-08-09 ENCOUNTER — Other Ambulatory Visit: Payer: Medicare Other | Admitting: *Deleted

## 2016-08-09 DIAGNOSIS — I2510 Atherosclerotic heart disease of native coronary artery without angina pectoris: Secondary | ICD-10-CM

## 2016-08-09 DIAGNOSIS — E7849 Other hyperlipidemia: Secondary | ICD-10-CM

## 2016-08-09 DIAGNOSIS — I10 Essential (primary) hypertension: Secondary | ICD-10-CM

## 2016-08-09 LAB — LIPID PANEL
Cholesterol: 132 mg/dL (ref 125–200)
HDL: 42 mg/dL — ABNORMAL LOW (ref >=46)
LDL CALC: 50 mg/dL (ref <130)
Total CHOL/HDL Ratio: 3.1 Ratio (ref <=5.0)
Triglycerides: 201 mg/dL — ABNORMAL HIGH (ref <150)
VLDL: 40 mg/dL — AB (ref <30)

## 2016-08-09 LAB — HEPATIC FUNCTION PANEL
ALBUMIN: 3.5 g/dL — AB (ref 3.6–5.1)
ALK PHOS: 92 U/L (ref 33–130)
ALT: 12 U/L (ref 6–29)
AST: 20 U/L (ref 10–35)
BILIRUBIN INDIRECT: 0.2 mg/dL (ref 0.2–1.2)
BILIRUBIN TOTAL: 0.3 mg/dL (ref 0.2–1.2)
Bilirubin, Direct: 0.1 mg/dL (ref <=0.2)
Total Protein: 6.4 g/dL (ref 6.1–8.1)

## 2016-08-10 ENCOUNTER — Encounter (HOSPITAL_COMMUNITY)
Admission: RE | Admit: 2016-08-10 | Discharge: 2016-08-10 | Disposition: A | Discharge status: Home / Self Care | Payer: Medicare Other | Source: Ambulatory Visit | Attending: Cardiovascular Disease | Admitting: Cardiovascular Disease

## 2016-08-10 DIAGNOSIS — Z955 Presence of coronary angioplasty implant and graft: Secondary | ICD-10-CM | POA: Diagnosis present

## 2016-08-10 DIAGNOSIS — I2510 Atherosclerotic heart disease of native coronary artery without angina pectoris: Secondary | ICD-10-CM | POA: Diagnosis not present

## 2016-08-10 DIAGNOSIS — I214 Non-ST elevation (NSTEMI) myocardial infarction: Secondary | ICD-10-CM | POA: Diagnosis not present

## 2016-08-10 NOTE — Progress Notes (Signed)
Cardiac Individual Treatment Plan  Patient Details  Name: Donna Williamson MRN: [id number] Date of Birth: [birth year] Referring Provider:   Flowsheet Row CARDIAC REHAB PHASE II ORIENTATION from 06/02/2016 in [facility]  Referring Provider  Sherren Mocha, MD      Initial Encounter Date:  [place] PHASE II ORIENTATION from 06/02/2016 in [facility]  Date  06/02/16  Referring Provider  Sherren Mocha, MD      Visit Diagnosis: 05/01/16 NSTEMI (non-ST elevated myocardial infarction) ([place])  05/02/16 Stented coronary artery  Patient's Home Medications on Admission:  Current Outpatient Prescriptions:  .  albuterol (PROVENTIL) (2.5 MG/3ML) 0.083% nebulizer solution, Take 3 mLs (2.5 mg total) by nebulization every 2 (two) hours as needed for wheezing or shortness of breath., Disp: 75 mL, Rfl: 2 .  ALPRAZolam (XANAX) 0.5 MG tablet, Take 0.5 mg by mouth daily as needed for anxiety. , Disp: , Rfl:  .  AMBULATORY NON FORMULARY MEDICATION, Take 81 mg by mouth daily. Medication Name: ASPIRIN 81 mg Daily TWILIGHT Research study PROVIDED, Disp: , Rfl:  .  Cholecalciferol (VITAMIN D3) 2000 UNITS TABS, Take 1 tablet by mouth daily., Disp: , Rfl:  .  clopidogrel (PLAVIX) 75 MG tablet, Take 1 tablet (75 mg total) by mouth daily., Disp: 90 tablet, Rfl: 3 .  cyanocobalamin 500 MCG tablet, Take 500 mcg by mouth daily., Disp: , Rfl:  .  diltiazem (CARDIZEM CD) 240 MG 24 hr capsule, Take 240 mg by mouth daily. , Disp: , Rfl:  .  EPIPEN 2-PAK 0.3 MG/0.3ML SOAJ injection, Inject 0.3 mg into the skin daily as needed (allergic reaction). , Disp: , Rfl:  .  estradiol (ESTRACE) 1 MG tablet, Take 1 mg by mouth daily., Disp: , Rfl:  .  FLUoxetine (PROZAC) 10 MG tablet, Take 10 mg by mouth daily., Disp: , Rfl:  .  fluticasone (FLONASE) 50 MCG/ACT nasal spray, Place 2 sprays into the nose daily., Disp: , Rfl:  .  Fluticasone-Salmeterol  (ADVAIR) 100-50 MCG/DOSE AEPB, Inhale 1 puff into the lungs every 12 (twelve) hours., Disp: , Rfl:  .  furosemide (LASIX) 20 MG tablet, Take 1 tablet (20 mg total) by mouth every other day., Disp: 30 tablet, Rfl: 6 .  gabapentin (NEURONTIN) 100 MG capsule, Take 100 mg by mouth daily. , Disp: , Rfl:  .  metoprolol tartrate (LOPRESSOR) 25 MG tablet, Take 0.5 tablets (12.5 mg total) by mouth 2 (two) times daily., Disp: 30 tablet, Rfl: 11 .  montelukast (SINGULAIR) 10 MG tablet, Take 10 mg by mouth at bedtime., Disp: , Rfl:  .  Multiple Vitamin (MULTIVITAMIN WITH MINERALS) TABS tablet, Take 1 tablet by mouth daily., Disp: , Rfl:  .  mupirocin ointment (BACTROBAN) 2 %, Apply 1 application topically daily as needed (skin irritation). , Disp: , Rfl: 0 .  nitroGLYCERIN (NITROSTAT) 0.4 MG SL tablet, Place 1 tablet (0.4 mg total) under the tongue every 5 (five) minutes as needed for chest pain., Disp: 25 tablet, Rfl: prn .  olmesartan (BENICAR) 20 MG tablet, Take 1 tablet (20 mg total) by mouth daily., Disp: 30 tablet, Rfl: 11 .  Olopatadine HCl (PATADAY) 0.2 % SOLN, Place 1-2 drops into both eyes daily as needed (allergies)., Disp: , Rfl:  .  pantoprazole (PROTONIX) 40 MG tablet, Take 1 tablet (40 mg total) by mouth daily., Disp: 90 tablet, Rfl: 3 .  PROAIR HFA 108 (90 BASE) MCG/ACT inhaler, Inhale 1-2 puffs into  the lungs every 4 (four) hours as needed for wheezing. , Disp: , Rfl:  .  rosuvastatin (CRESTOR) 20 MG tablet, Take 1 tablet (20 mg total) by mouth daily at 6 PM., Disp: 30 tablet, Rfl: 0 .  triamcinolone cream (KENALOG) 0.1 %, Apply 1 application topically 2 (two) times daily as needed (skin irritation)., Disp: , Rfl:   Past Medical History: Past Medical History:  Diagnosis Date  . Anemia   . Anxiety   . Arthritis    "feet, knees, back" (05/02/2016)  . Asthma   . CAD (coronary artery disease)    a. 04/2016: NSTEMI 95% stenosis 1st Mrg (s/p DES)  . Chest pain    a. 10/2013 Exercise Myoview: Ef  65%, no ischemia.  . Chronic bronchitis ([place])   . Chronic lower back pain   . GERD (gastroesophageal reflux disease)   . History of blood transfusion 1960s   "related to my hysterectomy"  . Hypertension   . Mitral valve prolapse   . NSTEMI (non-ST elevated myocardial infarction) ([place]) 05/01/2016  . Pneumonia 05/01/2016   "saw trace of slight pneumonia/CT scan" (05/02/2016)  . PONV (postoperative nausea and vomiting)   . Seasonal allergies   . Sjogren's syndrome ([place])     Tobacco Use: History  Smoking Status  . Never Smoker  Smokeless Tobacco  . Never Used    Labs: Recent Review Flowsheet Data    Labs for ITP Cardiac and Pulmonary Rehab Latest Ref Rng & Units 05/02/2016 05/02/2016 05/02/2016 05/03/2016 08/09/2016   Cholestrol 125 - 200 mg/dL - - 186 186 132   LDLCALC <130 mg/dL - - 84 80 50   HDL >=46 mg/dL - - 79 77 42(L)   Trlycerides <150 mg/dL - - 116 144 201(H)   Hemoglobin A1c 4.8 - 5.6 % - - - - -   PHART 7.350 - 7.450 - 7.414 - - -   PCO2ART 35.0 - 45.0 mmHg - 35.6 - - -   HCO3 20.0 - 24.0 mEq/L 24.7(H) 22.8 - - -   TCO2 0 - 100 mmol/L 26 24 - - -   ACIDBASEDEF 0.0 - 2.0 mmol/L - 1.0 - - -   O2SAT % 66.0 92.0 - - -      Capillary Blood Glucose: Lab Results  Component Value Date   GLUCAP 133 (H) 05/04/2016   GLUCAP 146 (H) 05/04/2016   GLUCAP 148 (H) 05/04/2016   GLUCAP 147 (H) 05/03/2016   GLUCAP 217 (H) 05/03/2016     Exercise Target Goals:    Exercise Program Goal: Individual exercise prescription set with THRR, safety & activity barriers. Participant demonstrates ability to understand and report RPE using BORG scale, to self-measure pulse accurately, and to acknowledge the importance of the exercise prescription.  Exercise Prescription Goal: Starting with aerobic activity 30 plus minutes a day, 3 days per week for initial exercise prescription. Provide home exercise prescription and guidelines that participant acknowledges understanding prior to  discharge.  Activity Barriers & Risk Stratification:     Activity Barriers & Cardiac Risk Stratification - 06/02/16 0903      Activity Barriers & Cardiac Risk Stratification   Activity Barriers Back Problems;Left Hip Replacement   Cardiac Risk Stratification High      6 Minute Walk:     6 Minute Walk    Row Name 06/02/16 1639         6 Minute Walk   Phase Initial     Distance 1007 feet  Walk Time 6 minutes     # of Rest Breaks 0     MPH 1.91     METS 1.5     RPE 13     VO2 Peak 5.26     Symptoms No     Resting HR 80 bpm     Resting BP 100/58     Max Ex. HR 94 bpm     Max Ex. BP 106/58     2 Minute Post BP 100/62        Initial Exercise Prescription:     Initial Exercise Prescription - 06/02/16 1600      Date of Initial Exercise RX and Referring Provider   Date 06/02/16   Referring Provider Sherren Mocha, MD     Recumbant Bike   Level 1.5   Minutes 10   METs 1.5     NuStep   Level 2   Minutes 10   METs 1.6     Track   Laps 7   Minutes 10   METs 2.23     Prescription Details   Frequency (times per week) 3   Duration Progress to 30 minutes of continuous aerobic without signs/symptoms of physical distress     Intensity   THRR 40-80% of Max Heartrate 59-118   Ratings of Perceived Exertion 11-13   Perceived Dyspnea 0-4     Progression   Progression Continue to progress workloads to maintain intensity without signs/symptoms of physical distress.     Resistance Training   Training Prescription Yes   Weight 1lb   Reps 10-12      Perform Capillary Blood Glucose checks as needed.  Exercise Prescription Changes:     Exercise Prescription Changes    Row Name 06/23/16 1200 07/14/16 1100 08/10/16 1400         Exercise Review   Progression Yes Yes  -       Response to Exercise   Blood Pressure (Admit) 120/60 104/62 98/58     Blood Pressure (Exercise) 118/60 112/50 138/60     Blood Pressure (Exit) 108/68 105/68 98/62     Heart  Rate (Admit) 88 bpm 83 bpm 78 bpm     Heart Rate (Exercise) 114 bpm 104 bpm 93 bpm     Heart Rate (Exit) 86 bpm 83 bpm 73 bpm     Rating of Perceived Exertion (Exercise) '12 12 11     '$ Comments  -  - Pt did not walk the track due to LE pain     Duration Progress to 30 minutes of continuous aerobic without signs/symptoms of physical distress Progress to 30 minutes of continuous aerobic without signs/symptoms of physical distress Progress to 30 minutes of continuous aerobic without signs/symptoms of physical distress     Intensity THRR unchanged THRR unchanged THRR unchanged       Progression   Progression Continue to progress workloads to maintain intensity without signs/symptoms of physical distress. Continue to progress workloads to maintain intensity without signs/symptoms of physical distress. Continue to progress workloads to maintain intensity without signs/symptoms of physical distress.     Average METs 2.5 2.9 2.5       Resistance Training   Training Prescription Yes Yes Yes     Weight 1lb 2lbs 2lbs     Reps 10-12 10-12 10-12       Recumbant Bike   Level 1.'5 4 4     '$ Minutes '10 10 10     '$ METs 1.5 2.3 2.3  NuStep   Level '3 3 4     '$ Minutes '10 10 20     '$ METs 2.5 3 2.6       Track   Laps 10 11  -     Minutes 10 10  -     METs 2.74 2.92  -       Home Exercise Plan   Plans to continue exercise at Home  reviewed HEP on 06/13/16 see progress note Home  reviewed HEP on 06/13/16 see progress note Home  reviewed HEP on 06/13/16 see progress note     Frequency Add 2 additional days to program exercise sessions. Add 2 additional days to program exercise sessions. Add 2 additional days to program exercise sessions.        Exercise Comments:     Exercise Comments    Row Name 06/10/16 1222 06/23/16 1253 07/14/16 1141       Exercise Comments There are no changes to Ex Rx. Pt is tolerating exercise very well and will continue to monitor pt progress Reviewed MET's. Pt is tolerating  exercise well will continue to monitor exercise progression Reviewed MET's. Pt is tolerating exercise well will continue to monitor exercise progression        Discharge Exercise Prescription (Final Exercise Prescription Changes):     Exercise Prescription Changes - 08/10/16 1400      Response to Exercise   Blood Pressure (Admit) 98/58   Blood Pressure (Exercise) 138/60   Blood Pressure (Exit) 98/62   Heart Rate (Admit) 78 bpm   Heart Rate (Exercise) 93 bpm   Heart Rate (Exit) 73 bpm   Rating of Perceived Exertion (Exercise) 11   Comments Pt did not walk the track due to LE pain   Duration Progress to 30 minutes of continuous aerobic without signs/symptoms of physical distress   Intensity THRR unchanged     Progression   Progression Continue to progress workloads to maintain intensity without signs/symptoms of physical distress.   Average METs 2.5     Resistance Training   Training Prescription Yes   Weight 2lbs   Reps 10-12     Recumbant Bike   Level 4   Minutes 10   METs 2.3     NuStep   Level 4   Minutes 20   METs 2.6     Home Exercise Plan   Plans to continue exercise at Home  reviewed HEP on 06/13/16 see progress note   Frequency Add 2 additional days to program exercise sessions.      Nutrition:  Target Goals: Understanding of nutrition guidelines, daily intake of sodium '1500mg'$ , cholesterol '200mg'$ , calories 30% from fat and 7% or less from saturated fats, daily to have 5 or more servings of fruits and vegetables.  Biometrics:     Pre Biometrics - 06/02/16 1643      Pre Biometrics   Height 5' 5.5" (1.664 m)   Weight 224 lb 13.9 oz (102 kg)   Waist Circumference 41.5 inches   Hip Circumference 50 inches   Waist to Hip Ratio 0.83 %   BMI (Calculated) 36.9   Triceps Skinfold 45 mm   % Body Fat 48.7 %   Grip Strength 25 kg   Flexibility 8 in   Single Leg Stand 1.27 seconds       Nutrition Therapy Plan and Nutrition Goals:     Nutrition  Therapy & Goals - 06/03/16 1409      Nutrition Therapy   Diet Therapeutic  Lifestyle Changes     Personal Nutrition Goals   Personal Goal #1 1-2 lb wt loss/week to a wt loss goal of 6-24 lb at graduation from Cardiac Rehab     Intervention Plan   Intervention Prescribe, educate and counsel regarding individualized specific dietary modifications aiming towards targeted core components such as weight, hypertension, lipid management, diabetes, heart failure and other comorbidities.   Expected Outcomes Short Term Goal: Understand basic principles of dietary content, such as calories, fat, sodium, cholesterol and nutrients.;Long Term Goal: Adherence to prescribed nutrition plan.      Nutrition Discharge: Nutrition Scores:     Nutrition Assessments - 06/03/16 1410      MEDFICTS Scores   Pre Score 21  will verify score with pt      Nutrition Goals Re-Evaluation:   Psychosocial: Target Goals: Acknowledge presence or absence of depression, maximize coping skills, provide positive support system. Participant is able to verbalize types and ability to use techniques and skills needed for reducing stress and depression.  Initial Review & Psychosocial Screening:     Initial Psych Review & Screening - 06/06/16 1625      Initial Review   Current issues with History of Depression     Family Dynamics   Good Support System? Yes     Barriers   Psychosocial barriers to participate in program The patient should benefit from training in stress management and relaxation.     Screening Interventions   Interventions Encouraged to exercise      Quality of Life Scores:     Quality of Life - 07/27/16 1708      Quality of Life Scores   GLOBAL Pre --  pt qol scores reflective of her health related anxiety due to recent cardiac event. overall scores are excellent.  pt has positve outlook with good coping skils.       PHQ-9: Recent Review Flowsheet Data    Depression screen [facility] 2/9  06/06/2016 04/02/2016 10/17/2015 08/23/2015   Decreased Interest 0 0 0 0   Down, Depressed, Hopeless 0 0 0 0   PHQ - 2 Score 0 0 0 0      Psychosocial Evaluation and Intervention:     Psychosocial Evaluation - 06/17/16 1026      Psychosocial Evaluation & Interventions   Interventions Stress management education;Relaxation education;Encouraged to exercise with the program and follow exercise prescription   Comments pt does health related anxiety from cardiac event, especially related to returning to work.  pt would like to be able to return to work but is aware of work related stress and health effects.  pt is presently working from home.    Continued Psychosocial Services Needed Yes      Psychosocial Re-Evaluation:     Psychosocial Re-Evaluation    Row Name 07/15/16 1629 08/10/16 1457           Psychosocial Re-Evaluation   Interventions Stress management education;Relaxation education;Encouraged to attend Cardiac Rehabilitation for the exercise Encouraged to attend Cardiac Rehabilitation for the exercise;Stress management education;Relaxation education      Comments pt health related anxiety is decreasing. pt verbalizes she looks forward to exercise at cardiac rehab and feels like she has more strenfth and stamina. no psychosocial barriers identified, no interventions necessary.  pt reports improvement from fatigue and hypotension with recent medication adjustment. pt is looking forward to retirement soon and feels that this life transition will decrease her stress and anxiety. 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  no psychosocial needs identified,no intervention necessary.       Continued Psychosocial Services Needed No No         Vocational Rehabilitation: Provide vocational rehab assistance to qualifying candidates.   Vocational Rehab Evaluation & Intervention:     Vocational Rehab - 06/06/16 1625      Initial Vocational Rehab Evaluation  & Intervention   Assessment shows need for Vocational Rehabilitation No      Education: Education Goals: Education classes will be provided on a weekly basis, covering required topics. Participant will state understanding/return demonstration of topics presented.  Learning Barriers/Preferences:     Learning Barriers/Preferences - 06/02/16 0904      Learning Barriers/Preferences   Learning Barriers Sight   Learning Preferences Skilled Demonstration      Education Topics: Count Your Pulse:  -Group instruction provided by verbal instruction, demonstration, patient participation and written materials to support subject.  Instructors address importance of being able to find your pulse and how to count your pulse when at home without a heart monitor.  Patients get hands on experience counting their pulse with staff help and individually. Flowsheet Row CARDIAC REHAB PHASE II EXERCISE from 07/20/2016 in [facility]  Date  07/01/16  Educator  Barnet Pall, RN  Instruction Review Code  2- meets goals/outcomes      Heart Attack, Angina, and Risk Factor Modification:  -Group instruction provided by verbal instruction, video, and written materials to support subject.  Instructors address signs and symptoms of angina and heart attacks.    Also discuss risk factors for heart disease and how to make changes to improve heart health risk factors. Flowsheet Row CARDIAC REHAB PHASE II EXERCISE from 07/20/2016 in [facility]  Date  06/22/16  Educator  RN  Instruction Review Code  2- meets goals/outcomes      Functional Fitness:  -Group instruction provided by verbal instruction, demonstration, patient participation, and written materials to support subject.  Instructors address safety measures for doing things around the house.  Discuss how to get up and down off the floor, how to pick things up properly, how to safely get out of a chair  without assistance, and balance training.   Meditation and Mindfulness:  -Group instruction provided by verbal instruction, patient participation, and written materials to support subject.  Instructor addresses importance of mindfulness and meditation practice to help reduce stress and improve awareness.  Instructor also leads participants through a meditation exercise.  Flowsheet Row CARDIAC REHAB PHASE II EXERCISE from 07/20/2016 in [place]  Date  06/29/16  Educator  Jeanella Craze  Instruction Review Code  2- meets goals/outcomes      Stretching for Flexibility and Mobility:  -Group instruction provided by verbal instruction, patient participation, and written materials to support subject.  Instructors lead participants through series of stretches that are designed to increase flexibility thus improving mobility.  These stretches are additional exercise for major muscle groups that are typically performed during regular warm up and cool down. Flowsheet Row CARDIAC REHAB PHASE II EXERCISE from 07/20/2016 in [place]  Date  06/24/16  Instruction Review Code  2- meets goals/outcomes      Hands Only CPR Anytime:  -Group instruction provided by verbal instruction, video, patient participation and written materials to support subject.  Instructors co-teach with [facility] video for hands only CPR.  Participants get hands on experience with  mannequins.   Nutrition I class: Heart Healthy Eating:  -Group instruction provided by PowerPoint slides, verbal discussion, and written materials to support subject matter. The instructor gives an explanation and review of the Therapeutic Lifestyle Changes diet recommendations, which includes a discussion on lipid goals, dietary fat, sodium, fiber, plant stanol/sterol esters, sugar, and the components of a well-balanced, healthy diet. Flowsheet Row CARDIAC REHAB PHASE II EXERCISE from 07/20/2016 in  [place]  Date  06/20/16  Educator  RD  Instruction Review Code  Not applicable [class handouts given]      Nutrition II class: Lifestyle Skills:  -Group instruction provided by PowerPoint slides, verbal discussion, and written materials to support subject matter. The instructor gives an explanation and review of label reading, grocery shopping for heart health, heart healthy recipe modifications, and ways to make healthier choices when eating out. Flowsheet Row CARDIAC REHAB PHASE II EXERCISE from 07/20/2016 in [place]  Date  06/20/16  Educator  RD  Instruction Review Code  Not applicable [class handouts given]      Diabetes Question & Answer:  -Group instruction provided by PowerPoint slides, verbal discussion, and written materials to support subject matter. The instructor gives an explanation and review of diabetes co-morbidities, pre- and post-prandial blood glucose goals, pre-exercise blood glucose goals, signs, symptoms, and treatment of hypoglycemia and hyperglycemia, and foot care basics. Flowsheet Row CARDIAC REHAB PHASE II EXERCISE from 07/20/2016 in [place]  Date  06/10/16  Educator  RD  Instruction Review Code  2- meets goals/outcomes      Diabetes Blitz:  -Group instruction provided by PowerPoint slides, verbal discussion, and written materials to support subject matter. The instructor gives an explanation and review of the physiology behind type 1 and type 2 diabetes, diabetes medications and rational behind using different medications, pre- and post-prandial blood glucose recommendations and Hemoglobin A1c goals, diabetes diet, and exercise including blood glucose guidelines for exercising safely.    Portion Distortion:  -Group instruction provided by PowerPoint slides, verbal discussion, written materials, and food models to support subject matter. The instructor gives  an explanation of serving size versus portion size, changes in portions sizes over the last 20 years, and what consists of a serving from each food group. Flowsheet Row CARDIAC REHAB PHASE II EXERCISE from 07/20/2016 in [place]  Date  06/08/16  Educator  RD  Instruction Review Code  2- meets goals/outcomes      Stress Management:  -Group instruction provided by verbal instruction, video, and written materials to support subject matter.  Instructors review role of stress in heart disease and how to cope with stress positively.     Exercising on Your Own:  -Group instruction provided by verbal instruction, power point, and written materials to support subject.  Instructors discuss benefits of exercise, components of exercise, frequency and intensity of exercise, and end points for exercise.  Also discuss use of nitroglycerin and activating EMS.  Review options of places to exercise outside of rehab.  Review guidelines for sex with heart disease. Flowsheet Row CARDIAC REHAB PHASE II EXERCISE from 07/20/2016 in [place]  Date  07/20/16  Instruction Review Code  2- meets goals/outcomes      Cardiac Drugs I:  -Group instruction provided by verbal instruction and written materials to support subject.  Instructor reviews cardiac drug classes: antiplatelets, anticoagulants, beta blockers, and statins.  Instructor discusses  reasons, side effects, and lifestyle considerations for each drug class.   Cardiac Drugs II:  -Group instruction provided by verbal instruction and written materials to support subject.  Instructor reviews cardiac drug classes: angiotensin converting enzyme inhibitors (ACE-I), angiotensin II receptor blockers (ARBs), nitrates, and calcium channel blockers.  Instructor discusses reasons, side effects, and lifestyle considerations for each drug class. Flowsheet Row CARDIAC REHAB PHASE II EXERCISE from 07/20/2016 in  [place]  Date  07/13/16  Educator  Pharm D  Instruction Review Code  2- meets goals/outcomes      Anatomy and Physiology of the Circulatory System:  -Group instruction provided by verbal instruction, video, and written materials to support subject.  Reviews functional anatomy of heart, how it relates to various diagnoses, and what role the heart plays in the overall system. Flowsheet Row CARDIAC REHAB PHASE II EXERCISE from 07/20/2016 in [place]  Date  07/06/16  Instruction Review Code  2- meets goals/outcomes      Knowledge Questionnaire Score:     Knowledge Questionnaire Score - 06/02/16 1630      Knowledge Questionnaire Score   Pre Score 19/24      Core Components/Risk Factors/Patient Goals at Admission:     Personal Goals and Risk Factors at Admission - 06/02/16 0905      Core Components/Risk Factors/Patient Goals on Admission    Weight Management Obesity;Yes   Intervention Weight Management: Provide education and appropriate resources to help participant work on and attain dietary goals.;Weight Management: Develop a combined nutrition and exercise program designed to reach desired caloric intake, while maintaining appropriate intake of nutrient and fiber, sodium and fats, and appropriate energy expenditure required for the weight goal.;Weight Management/Obesity: Establish reasonable short term and long term weight goals.;Obesity: Provide education and appropriate resources to help participant work on and attain dietary goals.   Expected Outcomes Short Term: Continue to assess and modify interventions until short term weight is achieved;Weight Maintenance: Understanding of the daily nutrition guidelines, which includes 25-35% calories from fat, 7% or less cal from saturated fats, less than '200mg'$  cholesterol, less than 1.5gm of sodium, & 5 or more servings of fruits and vegetables daily;Weight Loss: Understanding  of general recommendations for a balanced deficit meal plan, which promotes 1-2 lb weight loss per week and includes a negative energy balance of [id number] kcal/d;Understanding recommendations for meals to include 15-35% energy as protein, 25-35% energy from fat, 35-60% energy from carbohydrates, less than '200mg'$  of dietary cholesterol, 20-35 gm of total fiber daily;Understanding of distribution of calorie intake throughout the day with the consumption of 4-5 meals/snacks   Sedentary Yes   Intervention Provide advice, education, support and counseling about physical activity/exercise needs.;Develop an individualized exercise prescription for aerobic and resistive training based on initial evaluation findings, risk stratification, comorbidities and participant's personal goals.   Expected Outcomes Achievement of increased cardiorespiratory fitness and enhanced flexibility, muscular endurance and strength shown through measurements of functional capacity and personal statement of participant.   Increase Strength and Stamina Yes   Intervention Provide advice, education, support and counseling about physical activity/exercise needs.;Develop an individualized exercise prescription for aerobic and resistive training based on initial evaluation findings, risk stratification, comorbidities and participant's personal goals.   Expected Outcomes Achievement of increased cardiorespiratory fitness and enhanced flexibility, muscular endurance and strength shown through measurements of functional capacity and personal statement of participant.   Improve shortness of breath with ADL's Yes   Intervention Provide education, individualized exercise  plan and daily activity instruction to help decrease symptoms of SOB with activities of daily living.   Expected Outcomes Short Term: Achieves a reduction of symptoms when performing activities of daily living.   Hypertension Yes   Intervention Provide education on lifestyle  modifcations including regular physical activity/exercise, weight management, moderate sodium restriction and increased consumption of fresh fruit, vegetables, and low fat dairy, alcohol moderation, and smoking cessation.;Monitor prescription use compliance.   Expected Outcomes Short Term: Continued assessment and intervention until BP is < 140/23m HG in hypertensive participants. < 130/831mHG in hypertensive participants with diabetes, heart failure or chronic kidney disease.;Long Term: Maintenance of blood pressure at goal levels.   Lipids Yes   Intervention Provide education and support for participant on nutrition & aerobic/resistive exercise along with prescribed medications to achieve LDL '70mg'$ , HDL >'40mg'$ .   Expected Outcomes Short Term: Participant states understanding of desired cholesterol values and is compliant with medications prescribed. Participant is following exercise prescription and nutrition guidelines.;Long Term: Cholesterol controlled with medications as prescribed, with individualized exercise RX and with personalized nutrition plan. Value goals: LDL < '70mg'$ , HDL > 40 mg.   Personal Goal Other Yes   Personal Goal short: feel better, improve SOB and lose wt (5-10)  long: lose wt (50lbs), stronger heart and increase confidence   Intervention Provide nutrition education and exercise programming to improve cardiovascular fitness and confidence   Expected Outcomes Pt wil have improved SOB with activities, increased confidence and feel better      Core Components/Risk Factors/Patient Goals Review:      Goals and Risk Factor Review    Row Name 07/15/16 1553             Core Components/Risk Factors/Patient Goals Review   Personal Goals Review Increase Strength and Stamina       Review Pt stated that she feel stronger and has noticed an improvement in energy levels.       Expected Outcomes Pt will continue to progress in exercises and show improvement in cardiovascular fitness  levels          Core Components/Risk Factors/Patient Goals at Discharge (Final Review):      Goals and Risk Factor Review - 07/15/16 1553      Core Components/Risk Factors/Patient Goals Review   Personal Goals Review Increase Strength and Stamina   Review Pt stated that she feel stronger and has noticed an improvement in energy levels.   Expected Outcomes Pt will continue to progress in exercises and show improvement in cardiovascular fitness levels      ITP Comments:     ITP Comments    Row Name 06/02/16 0859           ITP Comments Dr. TrFransico HimMedical Director          Comments: Pt is making expected progress toward personal goals after completing 22 sessions. Pt absences have been due to frequent epistaxis.  Pt has been removed from the TWILIGHT study.  In addition, pt has experienced hypotensive medication side effects which are being corrected with medication adjustments as prescribed by her cardiologist.  Recommend continued exercise and life style modification education including  stress management and relaxation techniques to decrease cardiac risk profile.

## 2016-08-12 ENCOUNTER — Encounter (HOSPITAL_COMMUNITY)
Admission: RE | Admit: 2016-08-12 | Discharge: 2016-08-12 | Disposition: A | Discharge status: Home / Self Care | Payer: Medicare Other | Source: Ambulatory Visit | Attending: Cardiovascular Disease | Admitting: Cardiovascular Disease

## 2016-08-12 DIAGNOSIS — I214 Non-ST elevation (NSTEMI) myocardial infarction: Secondary | ICD-10-CM

## 2016-08-12 DIAGNOSIS — Z955 Presence of coronary angioplasty implant and graft: Secondary | ICD-10-CM

## 2016-08-15 ENCOUNTER — Encounter (HOSPITAL_COMMUNITY)
Admission: RE | Admit: 2016-08-15 | Discharge: 2016-08-15 | Disposition: A | Discharge status: Home / Self Care | Payer: Medicare Other | Source: Ambulatory Visit | Attending: Cardiovascular Disease | Admitting: Cardiovascular Disease

## 2016-08-15 DIAGNOSIS — I214 Non-ST elevation (NSTEMI) myocardial infarction: Secondary | ICD-10-CM

## 2016-08-15 DIAGNOSIS — Z955 Presence of coronary angioplasty implant and graft: Secondary | ICD-10-CM

## 2016-08-17 ENCOUNTER — Encounter (HOSPITAL_COMMUNITY): Payer: Medicare Other

## 2016-08-17 ENCOUNTER — Telehealth (HOSPITAL_COMMUNITY): Payer: Self-pay | Admitting: Internal Medicine

## 2016-08-18 ENCOUNTER — Encounter: Payer: Self-pay | Admitting: Cardiovascular Disease

## 2016-08-19 ENCOUNTER — Encounter (HOSPITAL_COMMUNITY): Payer: Medicare Other

## 2016-08-22 ENCOUNTER — Telehealth: Payer: Self-pay | Admitting: Cardiovascular Disease

## 2016-08-22 ENCOUNTER — Encounter (HOSPITAL_COMMUNITY)
Admission: RE | Admit: 2016-08-22 | Discharge: 2016-08-22 | Disposition: A | Discharge status: Home / Self Care | Payer: Medicare Other | Source: Ambulatory Visit | Attending: Cardiovascular Disease | Admitting: Cardiovascular Disease

## 2016-08-22 DIAGNOSIS — Z955 Presence of coronary angioplasty implant and graft: Secondary | ICD-10-CM

## 2016-08-22 DIAGNOSIS — I214 Non-ST elevation (NSTEMI) myocardial infarction: Secondary | ICD-10-CM

## 2016-08-22 NOTE — Telephone Encounter (Signed)
New message     Cardiac Rehab calling regarding patient C/O issues at rehab wanted to speak with Dr. Burt Knack nurse Ander Purpura.

## 2016-08-22 NOTE — Telephone Encounter (Signed)
I spoke with Donna Williamson and she said the pt complains of dizziness that has occurred all day.  BP standing was 110/60, sitting 98/60 and then when standing again 98/60. Pulse 60-66. The pt has taken her medications today except for Benicar which she normally takes at night.  I spoke with the pt and asked her to hold Benicar at this time. The pt will contact the office tomorrow with her vital signs. Pt agreed with plan.

## 2016-08-22 NOTE — Progress Notes (Signed)
Incomplete Session Note  Patient Details  Name: Donna Williamson MRN: [id number] Date of Birth: 1946/06/14 Referring Provider:   Flowsheet Row CARDIAC REHAB PHASE II ORIENTATION from 06/02/2016 in [place]  Referring Provider  Sherren Mocha, MD      [name] did not complete her rehab session.  Donna Williamson reports that she has felt lightheaded all day today and felt lightheaded at work. Blood pressure 98/66 sitting. Standing blood pressure 98/60. I called Theodosia Quay, Dr Antionette Char nurse to notify of the patient's complaints. Donna Williamson was instructed to hold her benicar tonight and tomorrow. Donna Williamson was given water and Gatorade to drink. Exit blood pressure 98/62. Donna Williamson did not exercise today.Will continue to monitor the patient throughout  the program. Donna Williamson plans to return to exercise on Wednesday. No complaints upon exit from cardiac rehab. Will continue to monitor the patient throughout  the program.Maria Venetia Maxon, RN,BSN 08/22/2016 6:02 PM

## 2016-08-24 ENCOUNTER — Encounter (HOSPITAL_COMMUNITY)
Admission: RE | Admit: 2016-08-24 | Discharge: 2016-08-24 | Disposition: A | Discharge status: Home / Self Care | Payer: Medicare Other | Source: Ambulatory Visit | Attending: Cardiovascular Disease | Admitting: Cardiovascular Disease

## 2016-08-24 DIAGNOSIS — I214 Non-ST elevation (NSTEMI) myocardial infarction: Secondary | ICD-10-CM

## 2016-08-24 DIAGNOSIS — Z955 Presence of coronary angioplasty implant and graft: Secondary | ICD-10-CM

## 2016-08-25 NOTE — Telephone Encounter (Signed)
Magda Kiel, RN  Barkley Boards, RN        Good morning Donna Williamson   Donna Williamson did well yesterday at cardiac rehab. Would you let her know when she can resume her Benicar. [place] took her last dose on Sunday.   Donna Williamson blood pressures from cardiac rehab yesterday are as follows,   Entry 112/78 heart rate 79   During exercise 118/60 heart rate 89   Exit blood pressure 114/75 heart rate 68.   Thanks for your assistance,   Have a great day!   Donna Williamson    Discussed the pt's readings with Dr Burt Knack and he would like the pt to continue to hold Benicar at this time since BP and pulse are within normal.   I spoke with the pt and she feels better off of Benicar.  I will remove this medication from her list. The pt will continue to monitor her vital signs and if she has further issues she will contact the office. The pt also questioned how she should be taking Furosemide. At the pt's office visit with Dr Burt Knack she was taking this daily but her medicine list still has every other day and daily.  The pt will continue to take Furosemide daily and I will correct her medication list.

## 2016-08-25 NOTE — Progress Notes (Signed)
Libby tolerated exercise without difficulty yesterday. Vital signs stable.Will continue to monitor the patient throughout  the program.Tedi Hughson Venetia Maxon, RN,BSN 08/25/2016 11:01 AM

## 2016-08-26 ENCOUNTER — Encounter (HOSPITAL_COMMUNITY)
Admission: RE | Admit: 2016-08-26 | Discharge: 2016-08-26 | Disposition: A | Discharge status: Home / Self Care | Payer: Medicare Other | Source: Ambulatory Visit | Attending: Cardiovascular Disease | Admitting: Cardiovascular Disease

## 2016-08-26 DIAGNOSIS — I214 Non-ST elevation (NSTEMI) myocardial infarction: Secondary | ICD-10-CM | POA: Diagnosis not present

## 2016-08-26 DIAGNOSIS — Z955 Presence of coronary angioplasty implant and graft: Secondary | ICD-10-CM

## 2016-08-29 ENCOUNTER — Encounter (HOSPITAL_COMMUNITY)
Admission: RE | Admit: 2016-08-29 | Discharge: 2016-08-29 | Disposition: A | Discharge status: Home / Self Care | Payer: Medicare Other | Source: Ambulatory Visit | Attending: Cardiovascular Disease | Admitting: Cardiovascular Disease

## 2016-08-29 DIAGNOSIS — Z955 Presence of coronary angioplasty implant and graft: Secondary | ICD-10-CM

## 2016-08-29 DIAGNOSIS — I214 Non-ST elevation (NSTEMI) myocardial infarction: Secondary | ICD-10-CM

## 2016-08-31 ENCOUNTER — Encounter (HOSPITAL_COMMUNITY)
Admission: RE | Admit: 2016-08-31 | Discharge: 2016-08-31 | Disposition: A | Discharge status: Home / Self Care | Payer: Medicare Other | Source: Ambulatory Visit | Attending: Cardiovascular Disease | Admitting: Cardiovascular Disease

## 2016-08-31 DIAGNOSIS — I214 Non-ST elevation (NSTEMI) myocardial infarction: Secondary | ICD-10-CM | POA: Diagnosis present

## 2016-08-31 DIAGNOSIS — I2510 Atherosclerotic heart disease of native coronary artery without angina pectoris: Secondary | ICD-10-CM | POA: Insufficient documentation

## 2016-08-31 DIAGNOSIS — Z955 Presence of coronary angioplasty implant and graft: Secondary | ICD-10-CM | POA: Diagnosis present

## 2016-09-02 ENCOUNTER — Encounter (HOSPITAL_COMMUNITY)
Admission: RE | Admit: 2016-09-02 | Discharge: 2016-09-02 | Disposition: A | Discharge status: Home / Self Care | Payer: Medicare Other | Source: Ambulatory Visit | Attending: Cardiovascular Disease | Admitting: Cardiovascular Disease

## 2016-09-02 ENCOUNTER — Telehealth (HOSPITAL_COMMUNITY): Payer: Self-pay | Admitting: Cardiac Rehabilitation

## 2016-09-02 ENCOUNTER — Other Ambulatory Visit: Payer: Self-pay

## 2016-09-02 ENCOUNTER — Telehealth: Payer: Self-pay

## 2016-09-02 DIAGNOSIS — I214 Non-ST elevation (NSTEMI) myocardial infarction: Secondary | ICD-10-CM | POA: Diagnosis not present

## 2016-09-02 DIAGNOSIS — I2510 Atherosclerotic heart disease of native coronary artery without angina pectoris: Secondary | ICD-10-CM | POA: Diagnosis not present

## 2016-09-02 DIAGNOSIS — Z955 Presence of coronary angioplasty implant and graft: Secondary | ICD-10-CM

## 2016-09-02 NOTE — Progress Notes (Signed)
Pt arrived at cardiac rehab reporting chest pain off and on over past 2 days. Pt describe as substernal burning sensation, similar to indigestion.  Pt reports symptoms come and go, worse with stress and anxiety, relieved with rest and being away from stressful environment. Pt rates pain 3/10 at worst.  Last episode this am while at work.  12 lead EKG obtained, no acute change, rare unifocal PVC.  BP:  128/54 HR-64.  PC to Dr. Antionette Char nurse, Ander Purpura.  Lauren spoke to pt and advised her to resume isosorbide 15mg  Daily and keep appt as scheduled Monday.  DC if dizziness, hypotension returns. Pt also instructed in proper NTG use.  Pt verbalized understanding.

## 2016-09-02 NOTE — Telephone Encounter (Signed)
I received a phone call from [place] at cardiac rehab where the pt made them aware that she has experienced some angina off and on the past two days with stress.  The pt's BP today was 128/54. EKG performed with no changes and rare PVCs on telemetry.  The pt was previously taking Imdur 30mg  daily but this was stopped in October due to low BP and dizziness.  The pt has a scheduled appointment with our office on Monday.  I advised her that she can try taking Imdur 15mg  daily over the next few days to see if this helps her symptoms.  The pt will stop the medication if her BP is low or she has dizziness. Pt agreed with plan. The pt will contact the office with further questions or concerns.

## 2016-09-02 NOTE — Telephone Encounter (Signed)
-----   Message from Sherren Mocha, MD sent at 09/02/2016  9:34 AM EDT ----- Regarding: RE: cardiac rehab  [place] thx - fine to continue with rehab and FU as planned ----- Message ----- From: Lowell Guitar, RN Sent: 09/02/2016   7:01 AM To: Sherren Mocha, MD Subject: cardiac rehab                                  Dear Dr. Burt Knack,  Pt c/o episode of palpitations at home that lasted a few hours.  Denies dizziness, dyspnea or chest pain.  NO arrythmias or symptoms noted at cardiac rehab.  Pt has previously scheduled appt with Richardson Dopp 09/05/16.    Thank you, Andi Hence, RN, BSN Cardiac Pulmonary Rehab

## 2016-09-05 ENCOUNTER — Encounter (HOSPITAL_COMMUNITY): Payer: Medicare Other

## 2016-09-05 ENCOUNTER — Telehealth: Payer: Self-pay | Admitting: *Deleted

## 2016-09-05 ENCOUNTER — Ambulatory Visit (INDEPENDENT_AMBULATORY_CARE_PROVIDER_SITE_OTHER): Payer: Medicare Other | Admitting: Physician Assistant

## 2016-09-05 ENCOUNTER — Telehealth (HOSPITAL_COMMUNITY): Payer: Self-pay | Admitting: Cardiac Rehabilitation

## 2016-09-05 ENCOUNTER — Encounter: Payer: Self-pay | Admitting: Physician Assistant

## 2016-09-05 VITALS — BP 110/60 | HR 72 | Ht 65.5 in | Wt 218.1 lb

## 2016-09-05 DIAGNOSIS — I5032 Chronic diastolic (congestive) heart failure: Secondary | ICD-10-CM | POA: Diagnosis not present

## 2016-09-05 DIAGNOSIS — I2510 Atherosclerotic heart disease of native coronary artery without angina pectoris: Secondary | ICD-10-CM | POA: Diagnosis not present

## 2016-09-05 DIAGNOSIS — I10 Essential (primary) hypertension: Secondary | ICD-10-CM

## 2016-09-05 DIAGNOSIS — E784 Other hyperlipidemia: Secondary | ICD-10-CM

## 2016-09-05 DIAGNOSIS — E7849 Other hyperlipidemia: Secondary | ICD-10-CM

## 2016-09-05 LAB — BASIC METABOLIC PANEL
BUN: 25 mg/dL (ref 7–25)
CHLORIDE: 106 mmol/L (ref 98–110)
CO2: 23 mmol/L (ref 20–31)
Calcium: 9.5 mg/dL (ref 8.6–10.4)
Creat: 1.25 mg/dL — ABNORMAL HIGH (ref 0.60–0.93)
GLUCOSE: 116 mg/dL — AB (ref 65–99)
POTASSIUM: 4.3 mmol/L (ref 3.5–5.3)
SODIUM: 139 mmol/L (ref 135–146)

## 2016-09-05 NOTE — Progress Notes (Signed)
Cardiology Office Note:    Date:  09/05/2016   ID:  Donna, Williamson April 18, 1946, MRN [id number]  PCP:  Jerlyn Ly, MD  Cardiologist:  Dr. Sherren Mocha   Electrophysiologist:  n/a  Referring MD: Crist Infante, MD   Chief Complaint  Patient presents with  . Follow-up    BP, chest pain    History of Present Illness:    Donna Williamson is a 70 y.o. female with a hx of CAD, diastolic CHF.  She was admitted in 7/17 with acute diastolic CHF in the setting of non-STEMI. LHC demonstrated moderate non-obs CAD involving the LAD and severe stenosis in the OM1 branch of the LCx. She was treated with PCI of the OM1 with a Synergy DES. She was noted to have moderate pulmonary HTN and elevated LVEDP at the time of her cardiac catheterization, consistent with diastolic CHF. EF was normal by echocardiogram. She was treated with IV diuretics with clinical improvement. Post PCI, she had severe problems with epistaxis and Brilinta was changed to Plavix.  Last seen by Dr. Burt Williamson 08/08/16. She was noted to have episodes of low blood pressure at cardiac rehabilitation. These episodes were symptomatic and associated with lightheadedness. Isosorbide was stopped.  Benicar has also been held.  She returns for FU.    The patient is here alone. She notes 2 episodes of chest discomfort that was fairly brief lasting less than 5 seconds. This occurred while she was at work. Work is very stressful for her. She denies exertional chest symptoms at cardiac rehabilitation. She denies dyspnea, orthopnea, PND or edema. She denies syncope. She has occasional palpitations. She had an ECG last week at cardiac rehabilitation. This demonstrated a single PVC. Of note, because of her chest pain, our office was contacted and she was told to resume isosorbide 15 mg daily.  Prior CV studies that were reviewed today include:    LHC 7/17 LAD proximal 30, mid 50 at bifurcation of D2, ostial D2 70 OM1 95 LVEDP 33 PASP 56  mmHg  Echo 05/01/16 EF 55-60, normal wall motion, mild MR  Carotid US 3/15 Normal bilateral carotid arteries  Past Medical History:  Diagnosis Date  . Anemia   . Anxiety   . Arthritis    "feet, knees, back" (05/02/2016)  . Asthma   . CAD (coronary artery disease)    a. 04/2016: NSTEMI 95% stenosis 1st Mrg (s/p DES)  . Chest pain    a. 10/2013 Exercise Myoview: Ef 65%, no ischemia.  . Chronic bronchitis ([place])   . Chronic lower back pain   . GERD (gastroesophageal reflux disease)   . History of blood transfusion 1960s   "related to my hysterectomy"  . Hypertension   . Mitral valve prolapse   . NSTEMI (non-ST elevated myocardial infarction) ([place]) 05/01/2016  . Pneumonia 05/01/2016   "saw trace of slight pneumonia/CT scan" (05/02/2016)  . PONV (postoperative nausea and vomiting)   . Seasonal allergies   . Sjogren's syndrome [facility])     Past Surgical History:  Procedure Laterality Date  . ABDOMINAL HYSTERECTOMY  1960s   "partial"  . CARDIAC CATHETERIZATION N/A 05/02/2016   Procedure: Right/Left Heart Cath and Coronary Angiography;  Surgeon: Jettie Booze, MD;  Location: [place] CV [facility];  Service: Cardiovascular;  Laterality: N/A;  . CARDIAC CATHETERIZATION N/A 05/02/2016   Procedure: Coronary Stent Intervention;  Surgeon: Jettie Booze, MD;  Location: [place] [facility];  Service: Cardiovascular;  Laterality: N/A;  . COLONOSCOPY W/  BIOPSIES AND POLYPECTOMY    . CORONARY ANGIOPLASTY WITH STENT PLACEMENT  05/02/2016   "1 stent"  . ESOPHAGOGASTRODUODENOSCOPY    . JOINT REPLACEMENT    . LAPAROSCOPIC SALPINGOOPHERECTOMY Bilateral ~ 1990  . TOTAL HIP ARTHROPLASTY Left 2007    Current Medications: Current Meds  Medication Sig  . albuterol (PROVENTIL) (2.5 MG/3ML) 0.083% nebulizer solution Take 3 mLs (2.5 mg total) by nebulization every 2 (two) hours as needed for wheezing or shortness of breath.  . ALPRAZolam (XANAX) 0.5 MG tablet Take 0.5 mg by mouth daily as needed for  anxiety.   . AMBULATORY NON FORMULARY MEDICATION Take 81 mg by mouth daily. Medication Name: ASPIRIN 81 mg Daily TWILIGHT Research study PROVIDED  . Cholecalciferol (VITAMIN D3) 2000 UNITS TABS Take 1 tablet by mouth daily.  . clopidogrel (PLAVIX) 75 MG tablet Take 1 tablet (75 mg total) by mouth daily.  . cyanocobalamin 500 MCG tablet Take 500 mcg by mouth daily.  Marland Kitchen diltiazem (CARDIZEM CD) 240 MG 24 hr capsule Take 240 mg by mouth daily.   Marland Kitchen EPIPEN 2-PAK 0.3 MG/0.3ML SOAJ injection Inject 0.3 mg into the skin daily as needed (allergic reaction).   Marland Kitchen estradiol (ESTRACE) 1 MG tablet Take 1 mg by mouth daily.  Marland Kitchen FLUoxetine (PROZAC) 10 MG tablet Take 10 mg by mouth daily.  . fluticasone (FLONASE) 50 MCG/ACT nasal spray Place 2 sprays into the nose daily.  . furosemide (LASIX) 20 MG tablet Take 1 tablet (20 mg total) by mouth daily.  Marland Kitchen gabapentin (NEURONTIN) 100 MG capsule Take 100 mg by mouth daily.   . isosorbide mononitrate (IMDUR) 30 MG 24 hr tablet TAKE HALF TABLET OF 30 MG BY MOUTH (15 MG TOTAL) ONCE DAILY  . metoprolol tartrate (LOPRESSOR) 25 MG tablet Take 0.5 tablets (12.5 mg total) by mouth 2 (two) times daily.  . montelukast (SINGULAIR) 10 MG tablet Take 10 mg by mouth at bedtime.  . Multiple Vitamin (MULTIVITAMIN WITH MINERALS) TABS tablet Take 1 tablet by mouth daily.  . mupirocin ointment (BACTROBAN) 2 % Apply 1 application topically daily as needed (skin irritation).   . nitroGLYCERIN (NITROSTAT) 0.4 MG SL tablet Place 1 tablet (0.4 mg total) under the tongue every 5 (five) minutes as needed for chest pain.  Marland Kitchen Olopatadine HCl (PATADAY) 0.2 % SOLN Place 1-2 drops into both eyes daily as needed (allergies).  . pantoprazole (PROTONIX) 40 MG tablet Take 1 tablet (40 mg total) by mouth daily.  Marland Kitchen PROAIR HFA 108 (90 BASE) MCG/ACT inhaler Inhale 1-2 puffs into the lungs every 4 (four) hours as needed for wheezing.   . rosuvastatin (CRESTOR) 20 MG tablet Take 1 tablet (20 mg total) by mouth  daily at 6 PM.  . triamcinolone cream (KENALOG) 0.1 % Apply 1 application topically 2 (two) times daily as needed (skin irritation).     Allergies:   Levaquin [levofloxacin]; Codeine; Macrobid [nitrofurantoin]; Ciprofloxacin; Penicillins; and Sulfa antibiotics   Social History   Social History  . Marital status: Married    Spouse name: N/A  . Number of children: N/A  . Years of education: N/A   Social History Main Topics  . Smoking status: Never Smoker  . Smokeless tobacco: Never Used  . Alcohol use No  . Drug use: No  . Sexual activity: Yes   Other Topics Concern  . None   Social History Narrative   Lives in [place] with husband.  Active but does not routinely exercise.     Family History:  The patient's family history includes Heart disease in her father; Hypertension in her mother; Ovarian cancer in her sister; Stroke in her mother.   ROS:   Please see the history of present illness.    Review of Systems  Cardiovascular: Positive for chest pain and palpitations.   All other systems reviewed and are negative.   EKGs/Labs/Other Test Reviewed:    EKG:  EKG is not ordered today.  The ekg ordered today demonstrates n/a ECG from 09/02/16 personally reviewed and demonstrates NSR, HR 67, normal axis, PVC, no significant change since prior tracings  Recent Labs: 05/01/2016: B Natriuretic Peptide 364.0 05/03/2016: TSH 0.382 05/04/2016: Magnesium 2.3 07/30/2016: Hemoglobin 9.6; Platelets 296 08/09/2016: ALT 12 09/05/2016: BUN 25; Creat 1.25; Potassium 4.3; Sodium 139   Recent Lipid Panel    Component Value Date/Time   CHOL 132 08/09/2016 0738   TRIG 201 (H) 08/09/2016 0738   HDL 42 (L) 08/09/2016 0738   CHOLHDL 3.1 08/09/2016 0738   VLDL 40 (H) 08/09/2016 0738   LDLCALC 50 08/09/2016 0738    Physical Exam:    VS:  BP 110/60   Pulse 72   Ht 5' 5.5" (1.664 m)   Wt 218 lb 1.9 oz (98.9 kg)   SpO2 95%   BMI 35.75 kg/m      Wt Readings from Last 3 Encounters:  09/05/16  218 lb 1.9 oz (98.9 kg)  08/08/16 217 lb 3.2 oz (98.5 kg)  07/30/16 215 lb (97.5 kg)     Physical Exam  Constitutional: She is oriented to person, place, and time. She appears well-developed and well-nourished. No distress.  HENT:  Head: Normocephalic and atraumatic.  Eyes: No scleral icterus.  Neck: No JVD present.  Cardiovascular: Normal rate, regular rhythm and normal heart sounds.   No murmur heard. Pulmonary/Chest: Effort normal. She has no wheezes. She has no rales.  Abdominal: Soft. There is no tenderness.  Musculoskeletal: She exhibits no edema.  Neurological: She is alert and oriented to person, place, and time.  Skin: Skin is warm and dry.  Psychiatric: She has a normal mood and affect.    ASSESSMENT:    1. Coronary artery disease involving native coronary artery of native heart without angina pectoris   2. Chronic diastolic CHF (congestive heart failure) ([place])   3. Essential hypertension   4. Other hyperlipidemia    PLAN:    In order of problems listed above:  1. CAD - Status post non-STEMI in 7/17 treated with DES to the OM1. She had some issues with chest pain last week that I think are related to stress.  I doubt angina.  But, she can continue the low dose Isosorbide as long as her blood pressure tolerates this.  Continue ASA, Plavix, statin, beta-blocker.  She is on Prozac which can increase levels of Plavix and potentially increase bleeding risk.  I have asked her to keep an eye out for bleeding issues.  She should continue cardiac rehab.    2. Chronic diastolic CHF - Volume stable.  Continue Lasix. Check BMET.  If worsening renal function, consider reducing dose of Lasix.  3. HTN - Recent episodes of low blood pressure card at rehabilitation. Isosorbide was discontinued at last visit with Dr. Burt Williamson one month ago.  Her Benicar was also DC'd.  She is now back on Isosorbide.  Her blood pressure is better.  Continue current regimen for now.  If blood pressure runs  low again, consider decreasing Diltiazem.  4. HL - LDL  was 50 in 10/17.  Continue statin.    Medication Adjustments/Labs and Tests Ordered: Current medicines are reviewed at length with the patient today.  Concerns regarding medicines are outlined above.  Medication changes, Labs and Tests ordered today are outlined in the Patient Instructions noted below. Patient Instructions  Medication Instructions:  Your physician recommends that you continue on your current medications as directed. Please refer to the Current Medication list given to you today.  Labwork: TODAY BMET  Testing/Procedures: NONE  Follow-Up: Judene Logue, [facility] ON 11/07/16 @ 9:45   Any Other Special Instructions Will Be Listed Below (If Applicable).  If you need a refill on your cardiac medications before your next appointment, please call your pharmacy.  Signed, Richardson Dopp, PA-C  09/05/2016 5:06 PM    [facility] [address], [place], [place]  [id number] Phone: [phone]; Fax: [phone]

## 2016-09-05 NOTE — Telephone Encounter (Signed)
Pt notified of lab results by phone with verbal understanding.  

## 2016-09-05 NOTE — Patient Instructions (Addendum)
Medication Instructions:  Your physician recommends that you continue on your current medications as directed. Please refer to the Current Medication list given to you today.  Labwork: TODAY BMET  Testing/Procedures: NONE  Follow-Up: SCOTT WEAVER, [facility] ON 11/07/16 @ 9:45   Any Other Special Instructions Will Be Listed Below (If Applicable).  If you need a refill on your cardiac medications before your next appointment, please call your pharmacy.

## 2016-09-05 NOTE — Telephone Encounter (Signed)
-----   Message from Liliane Shi, [place] sent at 09/05/2016 10:26 AM EST ----- She is fine to resume cardiac rehabilitation. Richardson Dopp, PA-C   09/05/2016 10:27 AM

## 2016-09-07 ENCOUNTER — Encounter (HOSPITAL_COMMUNITY)
Admission: RE | Admit: 2016-09-07 | Discharge: 2016-09-07 | Disposition: A | Discharge status: Home / Self Care | Payer: Medicare Other | Source: Ambulatory Visit | Attending: Cardiovascular Disease | Admitting: Cardiovascular Disease

## 2016-09-07 DIAGNOSIS — Z955 Presence of coronary angioplasty implant and graft: Secondary | ICD-10-CM

## 2016-09-07 DIAGNOSIS — I214 Non-ST elevation (NSTEMI) myocardial infarction: Secondary | ICD-10-CM

## 2016-09-09 ENCOUNTER — Encounter (HOSPITAL_COMMUNITY)
Admission: RE | Admit: 2016-09-09 | Discharge: 2016-09-09 | Disposition: A | Discharge status: Home / Self Care | Payer: Medicare Other | Source: Ambulatory Visit | Attending: Cardiovascular Disease | Admitting: Cardiovascular Disease

## 2016-09-09 DIAGNOSIS — Z955 Presence of coronary angioplasty implant and graft: Secondary | ICD-10-CM

## 2016-09-09 DIAGNOSIS — I214 Non-ST elevation (NSTEMI) myocardial infarction: Secondary | ICD-10-CM | POA: Diagnosis not present

## 2016-09-12 ENCOUNTER — Encounter (HOSPITAL_COMMUNITY)
Admission: RE | Admit: 2016-09-12 | Discharge: 2016-09-12 | Disposition: A | Discharge status: Home / Self Care | Payer: Medicare Other | Source: Ambulatory Visit | Attending: Cardiovascular Disease | Admitting: Cardiovascular Disease

## 2016-09-12 DIAGNOSIS — I214 Non-ST elevation (NSTEMI) myocardial infarction: Secondary | ICD-10-CM

## 2016-09-12 DIAGNOSIS — Z955 Presence of coronary angioplasty implant and graft: Secondary | ICD-10-CM

## 2016-09-14 ENCOUNTER — Encounter (HOSPITAL_COMMUNITY)
Admission: RE | Admit: 2016-09-14 | Discharge: 2016-09-14 | Disposition: A | Discharge status: Home / Self Care | Payer: Medicare Other | Source: Ambulatory Visit | Attending: Cardiovascular Disease | Admitting: Cardiovascular Disease

## 2016-09-14 DIAGNOSIS — I214 Non-ST elevation (NSTEMI) myocardial infarction: Secondary | ICD-10-CM

## 2016-09-14 DIAGNOSIS — Z955 Presence of coronary angioplasty implant and graft: Secondary | ICD-10-CM

## 2016-09-16 ENCOUNTER — Telehealth (HOSPITAL_COMMUNITY): Payer: Self-pay | Admitting: Internal Medicine

## 2016-09-16 ENCOUNTER — Encounter (HOSPITAL_COMMUNITY): Payer: Medicare Other

## 2016-09-19 ENCOUNTER — Encounter (HOSPITAL_COMMUNITY)
Admission: RE | Admit: 2016-09-19 | Discharge: 2016-09-19 | Disposition: A | Discharge status: Home / Self Care | Payer: Medicare Other | Source: Ambulatory Visit | Attending: Cardiovascular Disease | Admitting: Cardiovascular Disease

## 2016-09-19 DIAGNOSIS — I214 Non-ST elevation (NSTEMI) myocardial infarction: Secondary | ICD-10-CM

## 2016-09-19 DIAGNOSIS — Z955 Presence of coronary angioplasty implant and graft: Secondary | ICD-10-CM

## 2016-09-20 ENCOUNTER — Telehealth: Payer: Self-pay | Admitting: Physician Assistant

## 2016-09-20 NOTE — Telephone Encounter (Signed)
New message   Pt husband calling for the request to extend the cardiac class

## 2016-09-20 NOTE — Telephone Encounter (Signed)
[place], [place]   09/20/2016 5:59 PM

## 2016-09-20 NOTE — Telephone Encounter (Signed)
Will forward to Richardson Dopp, [place] for further recommendations.

## 2016-09-21 ENCOUNTER — Encounter (HOSPITAL_COMMUNITY)
Admission: RE | Admit: 2016-09-21 | Discharge: 2016-09-21 | Disposition: A | Discharge status: Home / Self Care | Payer: Medicare Other | Source: Ambulatory Visit | Attending: Cardiovascular Disease | Admitting: Cardiovascular Disease

## 2016-09-21 DIAGNOSIS — I214 Non-ST elevation (NSTEMI) myocardial infarction: Secondary | ICD-10-CM

## 2016-09-21 DIAGNOSIS — Z955 Presence of coronary angioplasty implant and graft: Secondary | ICD-10-CM

## 2016-09-21 NOTE — Telephone Encounter (Signed)
Sent message to Barnet Pall, RN at Cardiac Rehab letting her know the pt wants to extend rehab which is ok per Brynda Rim. PA.

## 2016-09-23 ENCOUNTER — Encounter (HOSPITAL_COMMUNITY): Payer: Medicare Other

## 2016-09-26 ENCOUNTER — Encounter (HOSPITAL_COMMUNITY): Payer: Self-pay

## 2016-09-26 ENCOUNTER — Encounter (HOSPITAL_COMMUNITY)
Admission: RE | Admit: 2016-09-26 | Discharge: 2016-09-26 | Disposition: A | Discharge status: Home / Self Care | Payer: Medicare Other | Source: Ambulatory Visit | Attending: Cardiovascular Disease | Admitting: Cardiovascular Disease

## 2016-09-26 VITALS — Ht 65.5 in | Wt 222.7 lb

## 2016-09-26 DIAGNOSIS — I214 Non-ST elevation (NSTEMI) myocardial infarction: Secondary | ICD-10-CM | POA: Diagnosis not present

## 2016-09-26 DIAGNOSIS — Z955 Presence of coronary angioplasty implant and graft: Secondary | ICD-10-CM

## 2016-09-26 NOTE — Progress Notes (Signed)
Cardiac Individual Treatment Plan  Patient Details  Name: Donna Williamson MRN: [id number] Date of Birth: November 14, 1945 Referring Provider:   Flowsheet Row CARDIAC REHAB PHASE II ORIENTATION from 06/02/2016 in [place]  Referring Provider  Sherren Mocha, MD      Initial Encounter Date:  [place] PHASE II ORIENTATION from 06/02/2016 in [place]  Date  06/02/16  Referring Provider  Sherren Mocha, MD      Visit Diagnosis: 05/01/16 NSTEMI (non-ST elevated myocardial infarction) ([place])  05/02/16 Stented coronary artery  Patient's Home Medications on Admission:  Current Outpatient Prescriptions:  .  albuterol (PROVENTIL) (2.5 MG/3ML) 0.083% nebulizer solution, Take 3 mLs (2.5 mg total) by nebulization every 2 (two) hours as needed for wheezing or shortness of breath., Disp: 75 mL, Rfl: 2 .  ALPRAZolam (XANAX) 0.5 MG tablet, Take 0.5 mg by mouth daily as needed for anxiety. , Disp: , Rfl:  .  aspirin EC 81 MG tablet, Take 81 mg by mouth daily., Disp: , Rfl:  .  Cholecalciferol (VITAMIN D3) 2000 UNITS TABS, Take 1 tablet by mouth daily., Disp: , Rfl:  .  clopidogrel (PLAVIX) 75 MG tablet, Take 1 tablet (75 mg total) by mouth daily., Disp: 90 tablet, Rfl: 3 .  cyanocobalamin 500 MCG tablet, Take 500 mcg by mouth daily., Disp: , Rfl:  .  diltiazem (CARDIZEM CD) 240 MG 24 hr capsule, Take 240 mg by mouth daily. , Disp: , Rfl:  .  EPIPEN 2-PAK 0.3 MG/0.3ML SOAJ injection, Inject 0.3 mg into the skin daily as needed (allergic reaction). , Disp: , Rfl:  .  estradiol (ESTRACE) 1 MG tablet, Take 1 mg by mouth daily., Disp: , Rfl:  .  FLUoxetine (PROZAC) 10 MG tablet, Take 10 mg by mouth daily., Disp: , Rfl:  .  fluticasone (FLONASE) 50 MCG/ACT nasal spray, Place 2 sprays into the nose daily., Disp: , Rfl:  .  furosemide (LASIX) 20 MG tablet, Take 1 tablet (20 mg total) by mouth daily., Disp: , Rfl:  .  gabapentin  (NEURONTIN) 100 MG capsule, Take 100 mg by mouth daily. , Disp: , Rfl:  .  isosorbide mononitrate (IMDUR) 30 MG 24 hr tablet, TAKE HALF TABLET OF 30 MG BY MOUTH (15 MG TOTAL) ONCE DAILY, Disp: , Rfl: 1 .  metoprolol tartrate (LOPRESSOR) 25 MG tablet, Take 0.5 tablets (12.5 mg total) by mouth 2 (two) times daily., Disp: 30 tablet, Rfl: 11 .  montelukast (SINGULAIR) 10 MG tablet, Take 10 mg by mouth at bedtime., Disp: , Rfl:  .  Multiple Vitamin (MULTIVITAMIN WITH MINERALS) TABS tablet, Take 1 tablet by mouth daily., Disp: , Rfl:  .  mupirocin ointment (BACTROBAN) 2 %, Apply 1 application topically daily as needed (skin irritation). , Disp: , Rfl: 0 .  nitroGLYCERIN (NITROSTAT) 0.4 MG SL tablet, Place 1 tablet (0.4 mg total) under the tongue every 5 (five) minutes as needed for chest pain., Disp: 25 tablet, Rfl: prn .  Olopatadine HCl (PATADAY) 0.2 % SOLN, Place 1-2 drops into both eyes daily as needed (allergies)., Disp: , Rfl:  .  pantoprazole (PROTONIX) 40 MG tablet, Take 1 tablet (40 mg total) by mouth daily., Disp: 90 tablet, Rfl: 3 .  PROAIR HFA 108 (90 BASE) MCG/ACT inhaler, Inhale 1-2 puffs into the lungs every 4 (four) hours as needed for wheezing. , Disp: , Rfl:  .  rosuvastatin (CRESTOR) 20 MG tablet, Take 1 tablet (20 mg  total) by mouth daily at 6 PM., Disp: 30 tablet, Rfl: 0 .  triamcinolone cream (KENALOG) 0.1 %, Apply 1 application topically 2 (two) times daily as needed (skin irritation)., Disp: , Rfl:   Past Medical History: Past Medical History:  Diagnosis Date  . Anemia   . Anxiety   . Arthritis    "feet, knees, back" (05/02/2016)  . Asthma   . CAD (coronary artery disease)    a. 04/2016: NSTEMI 95% stenosis 1st Mrg (s/p DES)  . Chest pain    a. 10/2013 Exercise Myoview: Ef 65%, no ischemia.  . Chronic bronchitis ([place])   . Chronic lower back pain   . GERD (gastroesophageal reflux disease)   . History of blood transfusion 1960s   "related to my hysterectomy"  . Hypertension    . Mitral valve prolapse   . NSTEMI (non-ST elevated myocardial infarction) ([place]) 05/01/2016  . Pneumonia 05/01/2016   "saw trace of slight pneumonia/CT scan" (05/02/2016)  . PONV (postoperative nausea and vomiting)   . Seasonal allergies   . Sjogren's syndrome ([place])     Tobacco Use: History  Smoking Status  . Never Smoker  Smokeless Tobacco  . Never Used    Labs: Recent Review Flowsheet Data    Labs for ITP Cardiac and Pulmonary Rehab Latest Ref Rng & Units 05/02/2016 05/02/2016 05/02/2016 05/03/2016 08/09/2016   Cholestrol 125 - 200 mg/dL - - 186 186 132   LDLCALC <130 mg/dL - - 84 80 50   HDL >=46 mg/dL - - 79 77 42(L)   Trlycerides <150 mg/dL - - 116 144 201(H)   Hemoglobin A1c 4.8 - 5.6 % - - - - -   PHART 7.350 - 7.450 - 7.414 - - -   PCO2ART 35.0 - 45.0 mmHg - 35.6 - - -   HCO3 20.0 - 24.0 mEq/L 24.7(H) 22.8 - - -   TCO2 0 - 100 mmol/L 26 24 - - -   ACIDBASEDEF 0.0 - 2.0 mmol/L - 1.0 - - -   O2SAT % 66.0 92.0 - - -      Capillary Blood Glucose: Lab Results  Component Value Date   GLUCAP 133 (H) 05/04/2016   GLUCAP 146 (H) 05/04/2016   GLUCAP 148 (H) 05/04/2016   GLUCAP 147 (H) 05/03/2016   GLUCAP 217 (H) 05/03/2016     Exercise Target Goals:    Exercise Program Goal: Individual exercise prescription set with THRR, safety & activity barriers. Participant demonstrates ability to understand and report RPE using BORG scale, to self-measure pulse accurately, and to acknowledge the importance of the exercise prescription.  Exercise Prescription Goal: Starting with aerobic activity 30 plus minutes a day, 3 days per week for initial exercise prescription. Provide home exercise prescription and guidelines that participant acknowledges understanding prior to discharge.  Activity Barriers & Risk Stratification:     Activity Barriers & Cardiac Risk Stratification - 06/02/16 0903      Activity Barriers & Cardiac Risk Stratification   Activity Barriers Back Problems;Left Hip  Replacement   Cardiac Risk Stratification High      6 Minute Walk:     6 Minute Walk    Row Name 06/02/16 1639 09/09/16 1614       6 Minute Walk   Phase Initial Discharge    Distance 1007 feet 1600 feet    Walk Time 6 minutes 6 minutes    # of Rest Breaks 0 0    MPH 1.91 3.03    METS  1.5 2.73    RPE 13 11    VO2 Peak 5.26 9.57    Symptoms No No    Resting HR 80 bpm 75 bpm    Resting BP 100/58 120/70    Max Ex. HR 94 bpm 101 bpm    Max Ex. BP 106/58 122/50    2 Minute Post BP 100/62 102/56       Initial Exercise Prescription:     Initial Exercise Prescription - 06/02/16 1600      Date of Initial Exercise RX and Referring Provider   Date 06/02/16   Referring Provider Sherren Mocha, MD     Recumbant Bike   Level 1.5   Minutes 10   METs 1.5     NuStep   Level 2   Minutes 10   METs 1.6     Track   Laps 7   Minutes 10   METs 2.23     Prescription Details   Frequency (times per week) 3   Duration Progress to 30 minutes of continuous aerobic without signs/symptoms of physical distress     Intensity   THRR 40-80% of Max Heartrate 59-118   Ratings of Perceived Exertion 11-13   Perceived Dyspnea 0-4     Progression   Progression Continue to progress workloads to maintain intensity without signs/symptoms of physical distress.     Resistance Training   Training Prescription Yes   Weight 1lb   Reps 10-12      Perform Capillary Blood Glucose checks as needed.  Exercise Prescription Changes:      Exercise Prescription Changes    Row Name 06/23/16 1200 07/14/16 1100 08/10/16 1400 09/06/16 1200 09/26/16 1002     Exercise Review   Progression Yes Yes  - Yes Yes     Response to Exercise   Blood Pressure (Admit) 120/60 104/62 98/58 110/68 104/64   Blood Pressure (Exercise) 118/60 112/50 138/60 106/68 150/80   Blood Pressure (Exit) 108/68 105/68 98/62 108/68 104/60   Heart Rate (Admit) 88 bpm 83 bpm 78 bpm 70 bpm 83 bpm   Heart Rate (Exercise) 114  bpm 104 bpm 93 bpm 94 bpm 98 bpm   Heart Rate (Exit) 86 bpm 83 bpm 73 bpm 70 bpm 66 bpm   Rating of Perceived Exertion (Exercise) _0 Comments  -  - Pt did not walk the track due to LE pain non none   Duration Progress to 30 minutes of continuous aerobic without signs/symptoms of physical distress Progress to 30 minutes of continuous aerobic without signs/symptoms of physical distress Progress to 30 minutes of continuous aerobic without signs/symptoms of physical distress Progress to 30 minutes of continuous aerobic without signs/symptoms of physical distress Progress to 30 minutes of continuous aerobic without signs/symptoms of physical distress   Intensity _1      Progression   Progression Continue to progress workloads to maintain intensity without signs/symptoms of physical distress. Continue to progress workloads to maintain intensity without signs/symptoms of physical distress. Continue to progress workloads to maintain intensity without signs/symptoms of physical distress. Continue to progress workloads to maintain intensity without signs/symptoms of physical distress. Continue progressive overload as per policy without signs/symptoms or physical distress.   Average METs 2.5 2.9 2.5 3 2.5     Resistance Training   Training Prescription _2    Weight 1lb 2lbs 2lbs 2lbs 2lbs   Reps 10-12 10-12 10-12  10-12 10-12     Recumbant Bike   Level 1._0 -   Minutes _1 -   METs 1.5 2.3 2.3 3.42  46 watts  -     NuStep   Level _2 Minutes _3 METs 2.5 3 2.6 2.7 2.9     Track   Laps 10 11  -  - 9   Minutes 10 10  -  - 15   METs 2.74 2.92  -  - 2.17     Home Exercise Plan   Plans to continue exercise at Home  reviewed HEP on 06/13/16 see progress note Home  reviewed HEP on 06/13/16 see progress note Home  reviewed HEP on 06/13/16 see progress note Home   reviewed HEP on 06/13/16 see progress note [facility] (comment)  CR maintenance at [facility]   Frequency Add 2 additional days to program exercise sessions. Add 2 additional days to program exercise sessions. Add 2 additional days to program exercise sessions. Add 2 additional days to program exercise sessions. Add 2 additional days to program exercise sessions.      Exercise Comments:      Exercise Comments    Row Name 06/10/16 1222 06/23/16 1253 07/14/16 1141 08/10/16 1651 09/06/16 1211   Exercise Comments There are no changes to Ex Rx. Pt is tolerating exercise very well and will continue to monitor pt progress Reviewed MET's. Pt is tolerating exercise well will continue to monitor exercise progression Reviewed MET's. Pt is tolerating exercise well will continue to monitor exercise progression Reviewed MET's. Pt is tolerating exercise well will continue to monitor exercise progression Reviewed MET's. Pt is tolerating exercise well will continue to monitor exercise progression   Row Name 10/06/16 1000           Exercise Comments Pt completed 36 sessions of cardiac rehab. Pt plans to walk and do CR maintenance for exercise          Discharge Exercise Prescription (Final Exercise Prescription Changes):     Exercise Prescription Changes - 09/26/16 1002      Exercise Review   Progression Yes     Response to Exercise   Blood Pressure (Admit) 104/64   Blood Pressure (Exercise) 150/80   Blood Pressure (Exit) 104/60   Heart Rate (Admit) 83 bpm   Heart Rate (Exercise) 98 bpm   Heart Rate (Exit) 66 bpm   Rating of Perceived Exertion (Exercise) 11   Comments none   Duration Progress to 30 minutes of continuous aerobic without signs/symptoms of physical distress   Intensity THRR unchanged     Progression   Progression Continue progressive overload as per policy without signs/symptoms or physical distress.   Average METs 2.5     Resistance Training   Training Prescription Yes    Weight 2lbs   Reps 10-12     NuStep   Level 4   Minutes 15   METs 2.9     Track   Laps 9   Minutes 15   METs 2.17     Home Exercise Plan   Plans to continue exercise at [facility] (comment)  CR maintenance at [facility]   Frequency Add 2 additional days to program exercise sessions.      Nutrition:  Target Goals: Understanding of nutrition guidelines, daily intake of sodium <1572m, cholesterol <2056m calories 30% from fat and 7% or less from saturated  fats, daily to have 5 or more servings of fruits and vegetables.  Biometrics:     Pre Biometrics - 06/02/16 1643      Pre Biometrics   Height 5' 5.5" (1.664 m)   Weight 224 lb 13.9 oz (102 kg)   Waist Circumference 41.5 inches   Hip Circumference 50 inches   Waist to Hip Ratio 0.83 %   BMI (Calculated) 36.9   Triceps Skinfold 45 mm   % Body Fat 48.7 %   Grip Strength 25 kg   Flexibility 8 in   Single Leg Stand 1.27 seconds         Post Biometrics - 10/06/16 1005       Post  Biometrics   Height 5' 5.5" (1.664 m)   Weight 222 lb 10.6 oz (101 kg)   Waist Circumference 41 inches   Hip Circumference 49.5 inches   Waist to Hip Ratio 0.83 %   BMI (Calculated) 36.6   Triceps Skinfold 40 mm   % Body Fat 47.6 %   Grip Strength 24 kg   Flexibility 8 in   Single Leg Stand 1.31 seconds      Nutrition Therapy Plan and Nutrition Goals:     Nutrition Therapy & Goals - 06/03/16 1409      Nutrition Therapy   Diet Therapeutic Lifestyle Changes     Personal Nutrition Goals   Personal Goal #1 1-2 lb wt loss/week to a wt loss goal of 6-24 lb at graduation from [place], educate and counsel regarding individualized specific dietary modifications aiming towards targeted core components such as weight, hypertension, lipid management, diabetes, heart failure and other comorbidities.   Expected Outcomes Short Term Goal: Understand basic principles of dietary content,  such as calories, fat, sodium, cholesterol and nutrients.;Long Term Goal: Adherence to prescribed nutrition plan.      Nutrition Discharge: Nutrition Scores:     Nutrition Assessments - 06/03/16 1410      MEDFICTS Scores   Pre Score 21  will verify score with pt      Nutrition Goals Re-Evaluation:   Psychosocial: Target Goals: Acknowledge presence or absence of depression, maximize coping skills, provide positive support system. Participant is able to verbalize types and ability to use techniques and skills needed for reducing stress and depression.  Initial Review & Psychosocial Screening:     Initial Psych Review & Screening - 06/06/16 1625      Initial Review   Current issues with History of Depression     Family Dynamics   Good Support System? Yes     Barriers   Psychosocial barriers to participate in program The patient should benefit from training in stress management and relaxation.     Screening Interventions   Interventions Encouraged to exercise      Quality of Life Scores:     Quality of Life - 10/06/16 0958      Quality of Life Scores   Health/Function Pre 26.23 %   Health/Function Post 27.75 %   Health/Function % Change 5.79 %   Socioeconomic Pre 30 %   Socioeconomic Post 24.21 %   Socioeconomic % Change  -19.3 %   Psych/Spiritual Pre 30 %   Psych/Spiritual Post 29.14 %   Psych/Spiritual % Change -2.87 %   Family Pre 27 %   Family Post 26.63 %   Family % Change -1.37 %   GLOBAL Pre 27.92 %   GLOBAL  Post 27.14 %   GLOBAL % Change -2.79 %      PHQ-9: Recent Review Flowsheet Data    Depression screen [facility] 2/9 09/26/2016 06/06/2016 04/02/2016 10/17/2015 08/23/2015   Decreased Interest 0 0 0 0 0   Down, Depressed, Hopeless 0 0 0 0 0   PHQ - 2 Score 0 0 0 0 0      Psychosocial Evaluation and Intervention:     Psychosocial Evaluation - 09/26/16 1447      Psychosocial Evaluation & Interventions   Continued Psychosocial Services Needed No      Discharge Psychosocial Assessment & Intervention   Discharge Continue support measures as needed   Comments no psychosocial needs identified, no intervention necessary       Psychosocial Re-Evaluation:     Psychosocial Re-Evaluation    [place] Name 07/15/16 1629 08/10/16 1457 09/07/16 1737         Psychosocial Re-Evaluation   Interventions Stress management education;Relaxation education;Encouraged to attend Cardiac Rehabilitation for the exercise Encouraged to attend Cardiac Rehabilitation for the exercise;Stress management education;Relaxation education Encouraged to attend Cardiac Rehabilitation for the exercise;Stress management education;Relaxation education     Comments pt health related anxiety is decreasing. pt verbalizes she looks forward to exercise at cardiac rehab and feels like she has more strenfth and stamina. no psychosocial barriers identified, no interventions necessary.  pt reports improvement from fatigue and hypotension with recent medication adjustment. pt is looking forward to retirement soon and feels that this life transition will decrease her stress and anxiety. pt reports she has been relaxation music  at work with positive benefits.  no psychosocial needs identified,no intervention necessary.  no psychosocial needs identified, no intervention necessary. pt is learning to handle  stress and anxiety productively.       Continued Psychosocial Services Needed No No  -        Vocational Rehabilitation: Provide vocational rehab assistance to qualifying candidates.   Vocational Rehab Evaluation & Intervention:     Vocational Rehab - 06/06/16 1625      Initial Vocational Rehab Evaluation & Intervention   Assessment shows need for Vocational Rehabilitation No      Education: Education Goals: Education classes will be provided on a weekly basis, covering required topics. Participant will state understanding/return demonstration of topics presented.  Learning  Barriers/Preferences:     Learning Barriers/Preferences - 06/02/16 0904      Learning Barriers/Preferences   Learning Barriers Sight   Learning Preferences Skilled Demonstration      Education Topics: Count Your Pulse:  -Group instruction provided by verbal instruction, demonstration, patient participation and written materials to support subject.  Instructors address importance of being able to find your pulse and how to count your pulse when at home without a heart monitor.  Patients get hands on experience counting their pulse with staff help and individually. Flowsheet Row CARDIAC REHAB PHASE II EXERCISE from 09/09/2016 in [place]  Date  07/01/16  Educator  Barnet Pall, RN  Instruction Review Code  2- meets goals/outcomes      Heart Attack, Angina, and Risk Factor Modification:  -Group instruction provided by verbal instruction, video, and written materials to support subject.  Instructors address signs and symptoms of angina and heart attacks.    Also discuss risk factors for heart disease and how to make changes to improve heart health risk factors. Flowsheet Row CARDIAC REHAB PHASE II EXERCISE from 09/09/2016 in [place]  Date  06/22/16  Educator  RN  Instruction Review Code  2- meets goals/outcomes      Functional Fitness:  -Group instruction provided by verbal instruction, demonstration, patient participation, and written materials to support subject.  Instructors address safety measures for doing things around the house.  Discuss how to get up and down off the floor, how to pick things up properly, how to safely get out of a chair without assistance, and balance training.   Meditation and Mindfulness:  -Group instruction provided by verbal instruction, patient participation, and written materials to support subject.  Instructor addresses importance of mindfulness and meditation practice to help reduce  stress and improve awareness.  Instructor also leads participants through a meditation exercise.  Flowsheet Row CARDIAC REHAB PHASE II EXERCISE from 09/09/2016 in [place]  Date  06/29/16  Educator  Jeanella Craze  Instruction Review Code  2- meets goals/outcomes      Stretching for Flexibility and Mobility:  -Group instruction provided by verbal instruction, patient participation, and written materials to support subject.  Instructors lead participants through series of stretches that are designed to increase flexibility thus improving mobility.  These stretches are additional exercise for major muscle groups that are typically performed during regular warm up and cool down. Flowsheet Row CARDIAC REHAB PHASE II EXERCISE from 09/09/2016 in [place]  Date  06/24/16  Instruction Review Code  2- meets goals/outcomes      Hands Only CPR Anytime:  -Group instruction provided by verbal instruction, video, patient participation and written materials to support subject.  Instructors co-teach with [facility] video for hands only CPR.  Participants get hands on experience with mannequins.   Nutrition I class: Heart Healthy Eating:  -Group instruction provided by PowerPoint slides, verbal discussion, and written materials to support subject matter. The instructor gives an explanation and review of the Therapeutic Lifestyle Changes diet recommendations, which includes a discussion on lipid goals, dietary fat, sodium, fiber, plant stanol/sterol esters, sugar, and the components of a well-balanced, healthy diet. Flowsheet Row CARDIAC REHAB PHASE II EXERCISE from 09/09/2016 in [place]  Date  06/20/16  Educator  RD  Instruction Review Code  Not applicable [class handouts given]      Nutrition II class: Lifestyle Skills:  -Group instruction provided by PowerPoint slides, verbal discussion, and written materials  to support subject matter. The instructor gives an explanation and review of label reading, grocery shopping for heart health, heart healthy recipe modifications, and ways to make healthier choices when eating out. Flowsheet Row CARDIAC REHAB PHASE II EXERCISE from 09/09/2016 in [place]  Date  06/20/16  Educator  RD  Instruction Review Code  Not applicable [class handouts given]      Diabetes Question & Answer:  -Group instruction provided by PowerPoint slides, verbal discussion, and written materials to support subject matter. The instructor gives an explanation and review of diabetes co-morbidities, pre- and post-prandial blood glucose goals, pre-exercise blood glucose goals, signs, symptoms, and treatment of hypoglycemia and hyperglycemia, and foot care basics. Flowsheet Row CARDIAC REHAB PHASE II EXERCISE from 09/09/2016 in [place]  Date  06/10/16  Educator  RD  Instruction Review Code  2- meets goals/outcomes      Diabetes Blitz:  -Group instruction provided by PowerPoint slides, verbal discussion, and written materials to support subject matter. The instructor gives an explanation and review of the physiology behind type 1  and type 2 diabetes, diabetes medications and rational behind using different medications, pre- and post-prandial blood glucose recommendations and Hemoglobin A1c goals, diabetes diet, and exercise including blood glucose guidelines for exercising safely.    Portion Distortion:  -Group instruction provided by PowerPoint slides, verbal discussion, written materials, and food models to support subject matter. The instructor gives an explanation of serving size versus portion size, changes in portions sizes over the last 20 years, and what consists of a serving from each food group. Flowsheet Row CARDIAC REHAB PHASE II EXERCISE from 09/09/2016 in [place]  Date   06/08/16  Educator  RD  Instruction Review Code  2- meets goals/outcomes      Stress Management:  -Group instruction provided by verbal instruction, video, and written materials to support subject matter.  Instructors review role of stress in heart disease and how to cope with stress positively.     Exercising on Your Own:  -Group instruction provided by verbal instruction, power point, and written materials to support subject.  Instructors discuss benefits of exercise, components of exercise, frequency and intensity of exercise, and end points for exercise.  Also discuss use of nitroglycerin and activating EMS.  Review options of places to exercise outside of rehab.  Review guidelines for sex with heart disease. Flowsheet Row CARDIAC REHAB PHASE II EXERCISE from 09/09/2016 in [place]  Date  07/20/16  Instruction Review Code  2- meets goals/outcomes      Cardiac Drugs I:  -Group instruction provided by verbal instruction and written materials to support subject.  Instructor reviews cardiac drug classes: antiplatelets, anticoagulants, beta blockers, and statins.  Instructor discusses reasons, side effects, and lifestyle considerations for each drug class.   Cardiac Drugs II:  -Group instruction provided by verbal instruction and written materials to support subject.  Instructor reviews cardiac drug classes: angiotensin converting enzyme inhibitors (ACE-I), angiotensin II receptor blockers (ARBs), nitrates, and calcium channel blockers.  Instructor discusses reasons, side effects, and lifestyle considerations for each drug class. Flowsheet Row CARDIAC REHAB PHASE II EXERCISE from 09/09/2016 in [place]  Date  07/13/16  Educator  Pharm D  Instruction Review Code  2- meets goals/outcomes      Anatomy and Physiology of the Circulatory System:  -Group instruction provided by verbal instruction, video, and written materials  to support subject.  Reviews functional anatomy of heart, how it relates to various diagnoses, and what role the heart plays in the overall system. Flowsheet Row CARDIAC REHAB PHASE II EXERCISE from 09/09/2016 in [place]  Date  07/06/16  Instruction Review Code  2- meets goals/outcomes      Knowledge Questionnaire Score:     Knowledge Questionnaire Score - 10/06/16 1006      Knowledge Questionnaire Score   Post Score 24/24      Core Components/Risk Factors/Patient Goals at Admission:     Personal Goals and Risk Factors at Admission - 06/02/16 0905      Core Components/Risk Factors/Patient Goals on Admission    Weight Management Obesity;Yes   Intervention Weight Management: Provide education and appropriate resources to help participant work on and attain dietary goals.;Weight Management: Develop a combined nutrition and exercise program designed to reach desired caloric intake, while maintaining appropriate intake of nutrient and fiber, sodium and fats, and appropriate energy expenditure required for the weight goal.;Weight Management/Obesity: Establish reasonable short term and long term weight goals.;Obesity: Provide education  and appropriate resources to help participant work on and attain dietary goals.   Expected Outcomes Short Term: Continue to assess and modify interventions until short term weight is achieved;Weight Maintenance: Understanding of the daily nutrition guidelines, which includes 25-35% calories from fat, 7% or less cal from saturated fats, less than 258m cholesterol, less than 1.5gm of sodium, & 5 or more servings of fruits and vegetables daily;Weight Loss: Understanding of general recommendations for a balanced deficit meal plan, which promotes 1-2 lb weight loss per week and includes a negative energy balance of [id number] kcal/d;Understanding recommendations for meals to include 15-35% energy as protein, 25-35% energy from fat, 35-60%  energy from carbohydrates, less than 2070mof dietary cholesterol, 20-35 gm of total fiber daily;Understanding of distribution of calorie intake throughout the day with the consumption of 4-5 meals/snacks   Sedentary Yes   Intervention Provide advice, education, support and counseling about physical activity/exercise needs.;Develop an individualized exercise prescription for aerobic and resistive training based on initial evaluation findings, risk stratification, comorbidities and participant's personal goals.   Expected Outcomes Achievement of increased cardiorespiratory fitness and enhanced flexibility, muscular endurance and strength shown through measurements of functional capacity and personal statement of participant.   Increase Strength and Stamina Yes   Intervention Provide advice, education, support and counseling about physical activity/exercise needs.;Develop an individualized exercise prescription for aerobic and resistive training based on initial evaluation findings, risk stratification, comorbidities and participant's personal goals.   Expected Outcomes Achievement of increased cardiorespiratory fitness and enhanced flexibility, muscular endurance and strength shown through measurements of functional capacity and personal statement of participant.   Improve shortness of breath with ADL's Yes   Intervention Provide education, individualized exercise plan and daily activity instruction to help decrease symptoms of SOB with activities of daily living.   Expected Outcomes Short Term: Achieves a reduction of symptoms when performing activities of daily living.   Hypertension Yes   Intervention Provide education on lifestyle modifcations including regular physical activity/exercise, weight management, moderate sodium restriction and increased consumption of fresh fruit, vegetables, and low fat dairy, alcohol moderation, and smoking cessation.;Monitor prescription use compliance.   Expected  Outcomes Short Term: Continued assessment and intervention until BP is < 140/9055mG in hypertensive participants. < 130/74m48m in hypertensive participants with diabetes, heart failure or chronic kidney disease.;Long Term: Maintenance of blood pressure at goal levels.   Lipids Yes   Intervention Provide education and support for participant on nutrition & aerobic/resistive exercise along with prescribed medications to achieve LDL <70mg1mL >40mg.18mxpected Outcomes Short Term: Participant states understanding of desired cholesterol values and is compliant with medications prescribed. Participant is following exercise prescription and nutrition guidelines.;Long Term: Cholesterol controlled with medications as prescribed, with individualized exercise RX and with personalized nutrition plan. Value goals: LDL < 70mg, 51m> 40 mg.   Personal Goal Other Yes   Personal Goal short: feel better, improve SOB and lose wt (5-10)  long: lose wt (50lbs), stronger heart and increase confidence   Intervention Provide nutrition education and exercise programming to improve cardiovascular fitness and confidence   Expected Outcomes Pt wil have improved SOB with activities, increased confidence and feel better      Core Components/Risk Factors/Patient Goals Review:      Goals and Risk Factor Review    Row Name 07/15/16 1553 08/10/16 1651 09/06/16 1211         Core Components/Risk Factors/Patient Goals Review   Personal Goals Review Increase Strength and Stamina Other Other;Increase  Strength and Stamina     Review Pt stated that she feel stronger and has noticed an improvement in energy levels. Pt reported feeling better than before cardiac surgery Pt reported feeling stronger and stamina improving     Expected Outcomes Pt will continue to progress in exercises and show improvement in cardiovascular fitness levels Pt will continue to improve in aerobic fitness and have increase confidence/awareness for managing  CVD Pt will continue to improve in aerobic fitness and have increase confidence/awareness for managing CVD        Core Components/Risk Factors/Patient Goals at Discharge (Final Review):      Goals and Risk Factor Review - 09/06/16 1211      Core Components/Risk Factors/Patient Goals Review   Personal Goals Review Other;Increase Strength and Stamina   Review Pt reported feeling stronger and stamina improving   Expected Outcomes Pt will continue to improve in aerobic fitness and have increase confidence/awareness for managing CVD      ITP Comments:     ITP Comments    Row Name 06/02/16 0859           ITP Comments Dr. Fransico Him, Medical Director          Comments: Pt graduated from cardiac rehab program today with completion of 36 exercise sessions in Phase II. Pt maintained good attendance and progressed nicely during his participation in rehab as evidenced by increased MET level.   Medication list reconciled. Repeat  PHQ score- 0 .  Pt has made significant lifestyle changes and should be commended for her  success. Pt feels she has achieved her goals during cardiac rehab, which include feeling better with increased strength, weight loss with decreased inches  Pt plans to continue exercise in cardiac maintenance program.  Pt would also like to volunteer in cardiac rehab program.

## 2016-09-28 ENCOUNTER — Encounter (HOSPITAL_COMMUNITY): Payer: Medicare Other

## 2016-09-30 ENCOUNTER — Encounter (HOSPITAL_COMMUNITY): Payer: Medicare Other

## 2016-10-03 ENCOUNTER — Encounter (HOSPITAL_COMMUNITY): Payer: Medicare Other

## 2016-10-05 ENCOUNTER — Encounter (HOSPITAL_COMMUNITY): Payer: Medicare Other

## 2016-10-10 ENCOUNTER — Encounter (HOSPITAL_COMMUNITY): Payer: Self-pay

## 2016-10-12 ENCOUNTER — Encounter (HOSPITAL_COMMUNITY): Payer: Self-pay

## 2016-10-14 ENCOUNTER — Encounter (HOSPITAL_COMMUNITY): Payer: Self-pay

## 2016-10-17 ENCOUNTER — Encounter (HOSPITAL_COMMUNITY): Payer: Self-pay

## 2016-10-19 ENCOUNTER — Encounter (HOSPITAL_COMMUNITY): Payer: Self-pay

## 2016-10-21 ENCOUNTER — Encounter (HOSPITAL_COMMUNITY): Payer: Self-pay

## 2016-10-26 ENCOUNTER — Encounter (HOSPITAL_COMMUNITY): Payer: Self-pay

## 2016-10-28 ENCOUNTER — Encounter (HOSPITAL_COMMUNITY): Payer: Self-pay

## 2016-11-02 ENCOUNTER — Encounter (HOSPITAL_COMMUNITY): Payer: Self-pay

## 2016-11-02 ENCOUNTER — Encounter (HOSPITAL_COMMUNITY)
Admission: RE | Admit: 2016-11-02 | Discharge: 2016-11-02 | Disposition: A | Discharge status: Home / Self Care | Payer: Self-pay | Source: Ambulatory Visit | Attending: Cardiovascular Disease | Admitting: Cardiovascular Disease

## 2016-11-02 DIAGNOSIS — Z955 Presence of coronary angioplasty implant and graft: Secondary | ICD-10-CM | POA: Insufficient documentation

## 2016-11-02 DIAGNOSIS — I252 Old myocardial infarction: Secondary | ICD-10-CM | POA: Insufficient documentation

## 2016-11-04 ENCOUNTER — Encounter (HOSPITAL_COMMUNITY): Payer: Self-pay

## 2016-11-07 ENCOUNTER — Encounter: Payer: Self-pay | Admitting: Physician Assistant

## 2016-11-07 ENCOUNTER — Ambulatory Visit (INDEPENDENT_AMBULATORY_CARE_PROVIDER_SITE_OTHER): Payer: Medicare Other | Admitting: Physician Assistant

## 2016-11-07 ENCOUNTER — Encounter (HOSPITAL_COMMUNITY)
Admission: RE | Admit: 2016-11-07 | Discharge: 2016-11-07 | Disposition: A | Discharge status: Home / Self Care | Payer: Self-pay | Source: Ambulatory Visit | Attending: Cardiovascular Disease | Admitting: Cardiovascular Disease

## 2016-11-07 VITALS — BP 108/60 | HR 62 | Ht 65.0 in | Wt 219.0 lb

## 2016-11-07 DIAGNOSIS — I10 Essential (primary) hypertension: Secondary | ICD-10-CM

## 2016-11-07 DIAGNOSIS — I5032 Chronic diastolic (congestive) heart failure: Secondary | ICD-10-CM

## 2016-11-07 DIAGNOSIS — I2510 Atherosclerotic heart disease of native coronary artery without angina pectoris: Secondary | ICD-10-CM

## 2016-11-07 NOTE — Patient Instructions (Addendum)
Medication Instructions:  Your physician recommends that you continue on your current medications as directed. Please refer to the Current Medication list given to you today.  Labwork: NONE  Testing/Procedures: NONE  Follow-Up: Your physician wants you to follow-up in: 04/2017 WITH DR. Burt Knack.  You will receive a reminder letter in the mail two months in advance. If you don't receive a letter, please call our office to schedule the follow-up appointment.  Any Other Special Instructions Will Be Listed Below (If Applicable).  If you need a refill on your cardiac medications before your next appointment, please call your pharmacy.

## 2016-11-07 NOTE — Progress Notes (Signed)
Cardiology Office Note:    Date:  11/07/2016   ID:  Dazie, Suhre [birth year], MRN [id number]  PCP:  Jerlyn Ly, MD  Cardiologist:  Dr. Sherren Mocha   Electrophysiologist:  n/a  Referring MD: Crist Infante, MD   Chief Complaint  Patient presents with  . Follow-up    CAD, CHF, HTN    History of Present Illness:    Donna Williamson is a 71 y.o. female with a hx of CAD, diastolic CHF.  She was admitted in 7/17 with acute diastolic CHF in the setting of non-STEMI. LHC demonstrated moderate non-obs CAD involving the LAD and severe stenosis in the OM1 branch of the LCx. She was treated with PCI of the OM1 with a Synergy DES. She was noted to have moderate pulmonary HTN and elevated LVEDP at the time of her cardiac catheterization, consistent with diastolic CHF. EF was normal by echocardiogram.  Her antiplatelet regimen was changed from Brilinta to Plavix due to side effects with epistaxis.    Last seen here by me in 11/17.  She returns for Cardiology follow up.  She is here with her husband today.  She is doing well.  She is now in the maintenance phase of [facility]. She is enjoying her exercise.  She denies any issues with her blood pressure.  She denies dizziness, syncope, chest pain, shortness of breath, orthopnea, PND.  She has mild pedal edema that is worse at the end of the day.    Prior CV studies that were reviewed today include:    LHC 7/17 LAD proximal 30, mid 50 at bifurcation of D2, ostial D2 70 OM1 95 LVEDP 33 PASP 56 mmHg  Echo 05/01/16 EF 55-60, normal wall motion, mild MR  Carotid US 3/15 Normal bilateral carotid arteries  Past Medical History:  Diagnosis Date  . Anemia   . Anxiety   . Arthritis    "feet, knees, back" (05/02/2016)  . Asthma   . CAD (coronary artery disease)    a. 04/2016: NSTEMI 95% stenosis 1st Mrg (s/p DES)  . Chest pain    a. 10/2013 Exercise Myoview: Ef 65%, no ischemia.  . Chronic bronchitis ([place])   . Chronic lower back pain   .  GERD (gastroesophageal reflux disease)   . History of blood transfusion 1960s   "related to my hysterectomy"  . Hypertension   . Mitral valve prolapse   . NSTEMI (non-ST elevated myocardial infarction) ([place]) 05/01/2016  . Pneumonia 05/01/2016   "saw trace of slight pneumonia/CT scan" (05/02/2016)  . PONV (postoperative nausea and vomiting)   . Seasonal allergies   . Sjogren's syndrome [facility])     Past Surgical History:  Procedure Laterality Date  . ABDOMINAL HYSTERECTOMY  1960s   "partial"  . CARDIAC CATHETERIZATION N/A 05/02/2016   Procedure: Right/Left Heart Cath and Coronary Angiography;  Surgeon: Jettie Booze, MD;  Location: [place] CV [facility];  Service: Cardiovascular;  Laterality: N/A;  . CARDIAC CATHETERIZATION N/A 05/02/2016   Procedure: Coronary Stent Intervention;  Surgeon: Jettie Booze, MD;  Location: [name] [facility];  Service: Cardiovascular;  Laterality: N/A;  . COLONOSCOPY W/ BIOPSIES AND POLYPECTOMY    . CORONARY ANGIOPLASTY WITH STENT PLACEMENT  05/02/2016   "1 stent"  . ESOPHAGOGASTRODUODENOSCOPY    . JOINT REPLACEMENT    . LAPAROSCOPIC SALPINGOOPHERECTOMY Bilateral ~ 1990  . TOTAL HIP ARTHROPLASTY Left 2007    Current Medications: Current Meds  Medication Sig  . albuterol (PROVENTIL) (2.5 MG/3ML) 0.083%  nebulizer solution Take 3 mLs (2.5 mg total) by nebulization every 2 (two) hours as needed for wheezing or shortness of breath.  . ALPRAZolam (XANAX) 0.5 MG tablet Take 0.5 mg by mouth daily as needed for anxiety.   Marland Kitchen aspirin EC 81 MG tablet Take 81 mg by mouth daily.  . Cholecalciferol (VITAMIN D3) 2000 UNITS TABS Take 1 tablet by mouth daily.  . clopidogrel (PLAVIX) 75 MG tablet Take 1 tablet (75 mg total) by mouth daily.  . cyanocobalamin 500 MCG tablet Take 500 mcg by mouth daily.  Marland Kitchen diltiazem (CARDIZEM CD) 240 MG 24 hr capsule Take 240 mg by mouth daily.   Marland Kitchen EPIPEN 2-PAK 0.3 MG/0.3ML SOAJ injection Inject 0.3 mg into the skin daily as needed (allergic  reaction).   Marland Kitchen estradiol (ESTRACE) 1 MG tablet Take 1 mg by mouth daily.  Marland Kitchen FLUoxetine (PROZAC) 10 MG tablet Take 10 mg by mouth daily.  . fluticasone (FLONASE) 50 MCG/ACT nasal spray Place 2 sprays into the nose daily.  . furosemide (LASIX) 20 MG tablet Take 1 tablet (20 mg total) by mouth daily.  Marland Kitchen gabapentin (NEURONTIN) 100 MG capsule Take 100 mg by mouth daily.   . isosorbide mononitrate (IMDUR) 30 MG 24 hr tablet TAKE HALF TABLET OF 30 MG BY MOUTH (15 MG TOTAL) ONCE DAILY  . metoprolol tartrate (LOPRESSOR) 25 MG tablet Take 0.5 tablets (12.5 mg total) by mouth 2 (two) times daily.  . montelukast (SINGULAIR) 10 MG tablet Take 10 mg by mouth at bedtime.  . Multiple Vitamin (MULTIVITAMIN WITH MINERALS) TABS tablet Take 1 tablet by mouth daily.  . mupirocin ointment (BACTROBAN) 2 % Apply 1 application topically daily as needed (skin irritation).   . nitroGLYCERIN (NITROSTAT) 0.4 MG SL tablet Place 1 tablet (0.4 mg total) under the tongue every 5 (five) minutes as needed for chest pain.  Marland Kitchen Olopatadine HCl (PATADAY) 0.2 % SOLN Place 1-2 drops into both eyes daily as needed (allergies).  . pantoprazole (PROTONIX) 40 MG tablet Take 1 tablet (40 mg total) by mouth daily.  Marland Kitchen PROAIR HFA 108 (90 BASE) MCG/ACT inhaler Inhale 1-2 puffs into the lungs every 4 (four) hours as needed for wheezing.   . rosuvastatin (CRESTOR) 20 MG tablet Take 1 tablet (20 mg total) by mouth daily at 6 PM.  . triamcinolone cream (KENALOG) 0.1 % Apply 1 application topically 2 (two) times daily as needed (skin irritation).     Allergies:   Levaquin [levofloxacin]; Codeine; Macrobid [nitrofurantoin]; Ciprofloxacin; Penicillins; and Sulfa antibiotics   Social History   Social History  . Marital status: Married    Spouse name: N/A  . Number of children: N/A  . Years of education: N/A   Social History Main Topics  . Smoking status: Never Smoker  . Smokeless tobacco: Never Used  . Alcohol use No  . Drug use: No  .  Sexual activity: Yes   Other Topics Concern  . None   Social History Narrative   Lives in [place] with husband.  Active but does not routinely exercise.     Family History:  The patient's family history includes Heart disease in her father; Hypertension in her mother; Ovarian cancer in her sister; Stroke in her mother.   ROS:   Please see the history of present illness.    Review of Systems  Cardiovascular: Positive for leg swelling.  Respiratory: Positive for cough.   Musculoskeletal: Positive for back pain.   All other systems reviewed and are negative.  EKGs/Labs/Other Test Reviewed:    EKG:  EKG is not ordered today.  The ekg ordered today demonstrates n/a  Recent Labs: 05/01/2016: B Natriuretic Peptide 364.0 05/03/2016: TSH 0.382 05/04/2016: Magnesium 2.3 07/30/2016: Hemoglobin 9.6; Platelets 296 08/09/2016: ALT 12 09/05/2016: BUN 25; Creat 1.25; Potassium 4.3; Sodium 139   Recent Lipid Panel    Component Value Date/Time   CHOL 132 08/09/2016 0738   TRIG 201 (H) 08/09/2016 0738   HDL 42 (L) 08/09/2016 0738   CHOLHDL 3.1 08/09/2016 0738   VLDL 40 (H) 08/09/2016 0738   LDLCALC 50 08/09/2016 0738     Physical Exam:    VS:  BP 108/60   Pulse 62   Ht 5\' 5"  (1.651 m)   Wt 219 lb (99.3 kg)   BMI 36.44 kg/m     Wt Readings from Last 3 Encounters:  11/07/16 219 lb (99.3 kg)  10/06/16 222 lb 10.6 oz (101 kg)  09/05/16 218 lb 1.9 oz (98.9 kg)     Physical Exam  Constitutional: She is oriented to person, place, and time. She appears well-developed and well-nourished. No distress.  HENT:  Head: Normocephalic and atraumatic.  Eyes: No scleral icterus.  Neck: No JVD present.  Cardiovascular: Normal rate, regular rhythm and normal heart sounds.   No murmur heard. Pulmonary/Chest: Effort normal. She has no wheezes. She has no rales.  Abdominal: There is no hepatomegaly. There is no tenderness.  Musculoskeletal: She exhibits no edema.  Neurological: She is alert and  oriented to person, place, and time.  Skin: Skin is warm and dry.  Psychiatric: She has a normal mood and affect.    ASSESSMENT:    1. Coronary artery disease involving native coronary artery of native heart without angina pectoris   2. Chronic diastolic CHF (congestive heart failure) ([place])   3. Essential hypertension    PLAN:    In order of problems listed above:  1. CAD - Status post non-STEMI in 7/17 treated with DES to the OM1.  She is doing well without angina.  She continues to go to cardiac rehab.    -  Continue ASA, Plavix, statin, beta-blocker.     2. Chronic diastolic CHF - Volume stable.  Continue current Rx.   3. HTN - BP well controlled.  Continue current regimen of Cardizem, Lasix, Imdur, Metoprolol.    Medication Adjustments/Labs and Tests Ordered: Current medicines are reviewed at length with the patient today.  Concerns regarding medicines are outlined above.  Medication changes, Labs and Tests ordered today are outlined in the Patient Instructions noted below. Patient Instructions  Medication Instructions:  Your physician recommends that you continue on your current medications as directed. Please refer to the Current Medication list given to you today.  Labwork: NONE  Testing/Procedures: NONE  Follow-Up: Your physician wants you to follow-up in: 04/2017 WITH DR. Burt Knack.  You will receive a reminder letter in the mail two months in advance. If you don't receive a letter, please call our office to schedule the follow-up appointment.  Any Other Special Instructions Will Be Listed Below (If Applicable).  If you need a refill on your cardiac medications before your next appointment, please call your pharmacy.  Signed, Richardson Dopp, PA-C  11/07/2016 10:26 AM    [facility] [address], [place], [place]  [address] Phone: [phone]; Fax: [phone]

## 2016-11-09 ENCOUNTER — Encounter (HOSPITAL_COMMUNITY)
Admission: RE | Admit: 2016-11-09 | Discharge: 2016-11-09 | Disposition: A | Discharge status: Home / Self Care | Payer: Self-pay | Source: Ambulatory Visit | Attending: Cardiovascular Disease | Admitting: Cardiovascular Disease

## 2016-11-11 ENCOUNTER — Encounter (HOSPITAL_COMMUNITY): Payer: Self-pay

## 2016-11-14 ENCOUNTER — Encounter (HOSPITAL_COMMUNITY)
Admission: RE | Admit: 2016-11-14 | Discharge: 2016-11-14 | Disposition: A | Discharge status: Home / Self Care | Payer: Self-pay | Source: Ambulatory Visit | Attending: Cardiovascular Disease | Admitting: Cardiovascular Disease

## 2016-11-16 ENCOUNTER — Encounter (HOSPITAL_COMMUNITY): Payer: Self-pay

## 2016-11-18 ENCOUNTER — Encounter (HOSPITAL_COMMUNITY)
Admission: RE | Admit: 2016-11-18 | Discharge: 2016-11-18 | Disposition: A | Discharge status: Home / Self Care | Payer: Self-pay | Source: Ambulatory Visit | Attending: Cardiovascular Disease | Admitting: Cardiovascular Disease

## 2016-11-21 ENCOUNTER — Encounter (HOSPITAL_COMMUNITY): Payer: Self-pay

## 2016-11-23 ENCOUNTER — Encounter (HOSPITAL_COMMUNITY)
Admission: RE | Admit: 2016-11-23 | Discharge: 2016-11-23 | Disposition: A | Discharge status: Home / Self Care | Payer: Self-pay | Source: Ambulatory Visit | Attending: Cardiovascular Disease | Admitting: Cardiovascular Disease

## 2016-11-25 ENCOUNTER — Encounter (HOSPITAL_COMMUNITY)
Admission: RE | Admit: 2016-11-25 | Discharge: 2016-11-25 | Disposition: A | Discharge status: Home / Self Care | Payer: Self-pay | Source: Ambulatory Visit | Attending: Cardiovascular Disease | Admitting: Cardiovascular Disease

## 2016-11-28 ENCOUNTER — Encounter (HOSPITAL_COMMUNITY): Payer: Self-pay

## 2016-11-30 ENCOUNTER — Encounter (HOSPITAL_COMMUNITY)
Admission: RE | Admit: 2016-11-30 | Discharge: 2016-11-30 | Disposition: A | Discharge status: Home / Self Care | Payer: Self-pay | Source: Ambulatory Visit | Attending: Cardiovascular Disease | Admitting: Cardiovascular Disease

## 2016-12-02 ENCOUNTER — Encounter (HOSPITAL_COMMUNITY)
Admission: RE | Admit: 2016-12-02 | Discharge: 2016-12-02 | Disposition: A | Discharge status: Home / Self Care | Payer: Self-pay | Source: Ambulatory Visit | Attending: Cardiovascular Disease | Admitting: Cardiovascular Disease

## 2016-12-02 DIAGNOSIS — Z955 Presence of coronary angioplasty implant and graft: Secondary | ICD-10-CM | POA: Insufficient documentation

## 2016-12-02 DIAGNOSIS — I252 Old myocardial infarction: Secondary | ICD-10-CM | POA: Insufficient documentation

## 2016-12-05 ENCOUNTER — Encounter (HOSPITAL_COMMUNITY)
Admission: RE | Admit: 2016-12-05 | Discharge: 2016-12-05 | Disposition: A | Discharge status: Home / Self Care | Payer: Self-pay | Source: Ambulatory Visit | Attending: Cardiovascular Disease | Admitting: Cardiovascular Disease

## 2016-12-07 ENCOUNTER — Encounter (HOSPITAL_COMMUNITY): Payer: Self-pay

## 2016-12-09 ENCOUNTER — Encounter (HOSPITAL_COMMUNITY)
Admission: RE | Admit: 2016-12-09 | Discharge: 2016-12-09 | Disposition: A | Discharge status: Home / Self Care | Payer: Self-pay | Source: Ambulatory Visit | Attending: Cardiovascular Disease | Admitting: Cardiovascular Disease

## 2016-12-12 ENCOUNTER — Encounter (HOSPITAL_COMMUNITY): Payer: Self-pay

## 2016-12-14 ENCOUNTER — Encounter (HOSPITAL_COMMUNITY)
Admission: RE | Admit: 2016-12-14 | Discharge: 2016-12-14 | Disposition: A | Discharge status: Home / Self Care | Payer: Self-pay | Source: Ambulatory Visit | Attending: Cardiovascular Disease | Admitting: Cardiovascular Disease

## 2016-12-16 ENCOUNTER — Encounter (HOSPITAL_COMMUNITY)
Admission: RE | Admit: 2016-12-16 | Discharge: 2016-12-16 | Disposition: A | Discharge status: Home / Self Care | Payer: Self-pay | Source: Ambulatory Visit | Attending: Cardiovascular Disease | Admitting: Cardiovascular Disease

## 2016-12-19 ENCOUNTER — Encounter (HOSPITAL_COMMUNITY)
Admission: RE | Admit: 2016-12-19 | Discharge: 2016-12-19 | Disposition: A | Discharge status: Home / Self Care | Payer: Self-pay | Source: Ambulatory Visit | Attending: Cardiovascular Disease | Admitting: Cardiovascular Disease

## 2016-12-21 ENCOUNTER — Encounter (HOSPITAL_COMMUNITY)
Admission: RE | Admit: 2016-12-21 | Discharge: 2016-12-21 | Disposition: A | Discharge status: Home / Self Care | Payer: Self-pay | Source: Ambulatory Visit | Attending: Cardiovascular Disease | Admitting: Cardiovascular Disease

## 2016-12-23 ENCOUNTER — Encounter (HOSPITAL_COMMUNITY)
Admission: RE | Admit: 2016-12-23 | Discharge: 2016-12-23 | Disposition: A | Discharge status: Home / Self Care | Payer: Self-pay | Source: Ambulatory Visit | Attending: Cardiovascular Disease | Admitting: Cardiovascular Disease

## 2016-12-26 ENCOUNTER — Encounter (HOSPITAL_COMMUNITY)
Admission: RE | Admit: 2016-12-26 | Discharge: 2016-12-26 | Disposition: A | Discharge status: Home / Self Care | Payer: Self-pay | Source: Ambulatory Visit | Attending: Cardiovascular Disease | Admitting: Cardiovascular Disease

## 2016-12-28 ENCOUNTER — Encounter (HOSPITAL_COMMUNITY)
Admission: RE | Admit: 2016-12-28 | Discharge: 2016-12-28 | Disposition: A | Discharge status: Home / Self Care | Payer: Self-pay | Source: Ambulatory Visit | Attending: Cardiovascular Disease | Admitting: Cardiovascular Disease

## 2016-12-30 ENCOUNTER — Encounter (HOSPITAL_COMMUNITY): Payer: Self-pay

## 2016-12-30 DIAGNOSIS — I252 Old myocardial infarction: Secondary | ICD-10-CM | POA: Insufficient documentation

## 2016-12-30 DIAGNOSIS — Z955 Presence of coronary angioplasty implant and graft: Secondary | ICD-10-CM | POA: Insufficient documentation

## 2017-01-02 ENCOUNTER — Encounter (HOSPITAL_COMMUNITY): Payer: Self-pay

## 2017-01-04 ENCOUNTER — Encounter (HOSPITAL_COMMUNITY): Payer: Self-pay

## 2017-01-06 ENCOUNTER — Encounter (HOSPITAL_COMMUNITY): Payer: Self-pay

## 2017-01-09 ENCOUNTER — Encounter (HOSPITAL_COMMUNITY): Admission: RE | Admit: 2017-01-09 | Payer: Self-pay | Source: Ambulatory Visit

## 2017-01-11 ENCOUNTER — Encounter (HOSPITAL_COMMUNITY): Payer: Self-pay

## 2017-01-13 ENCOUNTER — Encounter (HOSPITAL_COMMUNITY): Payer: Self-pay

## 2017-01-16 ENCOUNTER — Encounter (HOSPITAL_COMMUNITY): Payer: Self-pay

## 2017-01-18 ENCOUNTER — Encounter (HOSPITAL_COMMUNITY): Payer: Self-pay

## 2017-01-20 ENCOUNTER — Encounter (HOSPITAL_COMMUNITY)
Admission: RE | Admit: 2017-01-20 | Discharge: 2017-01-20 | Disposition: A | Discharge status: Home / Self Care | Payer: Self-pay | Source: Ambulatory Visit | Attending: Cardiovascular Disease | Admitting: Cardiovascular Disease

## 2017-01-23 ENCOUNTER — Encounter (HOSPITAL_COMMUNITY): Payer: Self-pay

## 2017-01-25 ENCOUNTER — Encounter (HOSPITAL_COMMUNITY): Payer: Self-pay

## 2017-01-27 ENCOUNTER — Encounter (HOSPITAL_COMMUNITY)
Admission: RE | Admit: 2017-01-27 | Discharge: 2017-01-27 | Disposition: A | Discharge status: Home / Self Care | Payer: Self-pay | Source: Ambulatory Visit | Attending: Cardiovascular Disease | Admitting: Cardiovascular Disease

## 2017-01-30 ENCOUNTER — Encounter (HOSPITAL_COMMUNITY)
Admission: RE | Admit: 2017-01-30 | Discharge: 2017-01-30 | Disposition: A | Discharge status: Home / Self Care | Payer: Self-pay | Source: Ambulatory Visit | Attending: Cardiovascular Disease | Admitting: Cardiovascular Disease

## 2017-01-30 DIAGNOSIS — I252 Old myocardial infarction: Secondary | ICD-10-CM | POA: Insufficient documentation

## 2017-01-30 DIAGNOSIS — Z955 Presence of coronary angioplasty implant and graft: Secondary | ICD-10-CM | POA: Insufficient documentation

## 2017-02-01 ENCOUNTER — Encounter (HOSPITAL_COMMUNITY): Payer: Self-pay

## 2017-02-03 ENCOUNTER — Encounter (HOSPITAL_COMMUNITY): Payer: Self-pay

## 2017-02-06 ENCOUNTER — Encounter (HOSPITAL_COMMUNITY)
Admission: RE | Admit: 2017-02-06 | Discharge: 2017-02-06 | Disposition: A | Discharge status: Home / Self Care | Payer: Self-pay | Source: Ambulatory Visit | Attending: Cardiovascular Disease | Admitting: Cardiovascular Disease

## 2017-02-08 ENCOUNTER — Encounter (HOSPITAL_COMMUNITY)
Admission: RE | Admit: 2017-02-08 | Discharge: 2017-02-08 | Disposition: A | Discharge status: Home / Self Care | Payer: Self-pay | Source: Ambulatory Visit | Attending: Cardiovascular Disease | Admitting: Cardiovascular Disease

## 2017-02-10 ENCOUNTER — Encounter (HOSPITAL_COMMUNITY): Payer: Self-pay

## 2017-02-13 ENCOUNTER — Encounter (HOSPITAL_COMMUNITY)
Admission: RE | Admit: 2017-02-13 | Discharge: 2017-02-13 | Disposition: A | Discharge status: Home / Self Care | Payer: Self-pay | Source: Ambulatory Visit | Attending: Cardiovascular Disease | Admitting: Cardiovascular Disease

## 2017-02-15 ENCOUNTER — Encounter (HOSPITAL_COMMUNITY): Payer: Self-pay

## 2017-02-17 ENCOUNTER — Encounter (HOSPITAL_COMMUNITY): Payer: Self-pay

## 2017-02-20 ENCOUNTER — Encounter (HOSPITAL_COMMUNITY)
Admission: RE | Admit: 2017-02-20 | Discharge: 2017-02-20 | Disposition: A | Discharge status: Home / Self Care | Payer: Self-pay | Source: Ambulatory Visit | Attending: Cardiovascular Disease | Admitting: Cardiovascular Disease

## 2017-02-22 ENCOUNTER — Encounter (HOSPITAL_COMMUNITY): Payer: Self-pay

## 2017-02-24 ENCOUNTER — Encounter (HOSPITAL_COMMUNITY): Payer: Self-pay

## 2017-02-27 ENCOUNTER — Encounter (HOSPITAL_COMMUNITY): Payer: Self-pay

## 2017-03-01 ENCOUNTER — Encounter (HOSPITAL_COMMUNITY): Payer: Self-pay

## 2017-03-01 DIAGNOSIS — I252 Old myocardial infarction: Secondary | ICD-10-CM | POA: Insufficient documentation

## 2017-03-01 DIAGNOSIS — Z955 Presence of coronary angioplasty implant and graft: Secondary | ICD-10-CM | POA: Insufficient documentation

## 2017-03-03 ENCOUNTER — Encounter (HOSPITAL_COMMUNITY)
Admission: RE | Admit: 2017-03-03 | Discharge: 2017-03-03 | Disposition: A | Discharge status: Home / Self Care | Payer: Self-pay | Source: Ambulatory Visit | Attending: Cardiovascular Disease | Admitting: Cardiovascular Disease

## 2017-03-06 ENCOUNTER — Encounter (HOSPITAL_COMMUNITY)
Admission: RE | Admit: 2017-03-06 | Discharge: 2017-03-06 | Disposition: A | Discharge status: Home / Self Care | Payer: Self-pay | Source: Ambulatory Visit | Attending: Cardiovascular Disease | Admitting: Cardiovascular Disease

## 2017-03-08 ENCOUNTER — Encounter (HOSPITAL_COMMUNITY)
Admission: RE | Admit: 2017-03-08 | Discharge: 2017-03-08 | Disposition: A | Discharge status: Home / Self Care | Payer: Self-pay | Source: Ambulatory Visit | Attending: Cardiovascular Disease | Admitting: Cardiovascular Disease

## 2017-03-10 ENCOUNTER — Encounter (HOSPITAL_COMMUNITY): Payer: Self-pay

## 2017-03-13 ENCOUNTER — Encounter (HOSPITAL_COMMUNITY)
Admission: RE | Admit: 2017-03-13 | Discharge: 2017-03-13 | Disposition: A | Discharge status: Home / Self Care | Payer: Self-pay | Source: Ambulatory Visit | Attending: Cardiovascular Disease | Admitting: Cardiovascular Disease

## 2017-03-15 ENCOUNTER — Encounter (HOSPITAL_COMMUNITY): Payer: Self-pay

## 2017-03-17 ENCOUNTER — Encounter (HOSPITAL_COMMUNITY): Payer: Self-pay

## 2017-03-20 ENCOUNTER — Encounter (HOSPITAL_COMMUNITY)
Admission: RE | Admit: 2017-03-20 | Discharge: 2017-03-20 | Disposition: A | Discharge status: Home / Self Care | Payer: Self-pay | Source: Ambulatory Visit | Attending: Cardiovascular Disease | Admitting: Cardiovascular Disease

## 2017-03-22 ENCOUNTER — Encounter (HOSPITAL_COMMUNITY): Payer: Self-pay

## 2017-03-24 ENCOUNTER — Encounter (HOSPITAL_COMMUNITY)
Admission: RE | Admit: 2017-03-24 | Discharge: 2017-03-24 | Disposition: A | Discharge status: Home / Self Care | Payer: Self-pay | Source: Ambulatory Visit | Attending: Cardiovascular Disease | Admitting: Cardiovascular Disease

## 2017-03-29 ENCOUNTER — Telehealth: Payer: Self-pay | Admitting: Cardiovascular Disease

## 2017-03-29 ENCOUNTER — Encounter (HOSPITAL_COMMUNITY)
Admission: RE | Admit: 2017-03-29 | Discharge: 2017-03-29 | Disposition: A | Discharge status: Home / Self Care | Payer: Self-pay | Source: Ambulatory Visit | Attending: Cardiovascular Disease | Admitting: Cardiovascular Disease

## 2017-03-29 NOTE — Telephone Encounter (Signed)
I spoke with the pt and Monday she ate a hot dog with chili and onions and she developed pain in her chest.  The pt took TUMS Monday and Tuesday with relief of symptoms.  The pt exercised at [place] today without any symptoms.  The pt does have a history of reflux and is currently taking protonix.  I advised her to continue with observation at this time and avoid spicy and greasy foods.  The pt will use TUMS as needed. Pt agreed with plan and will contact the office if she has any additional symptoms.

## 2017-03-29 NOTE — Telephone Encounter (Signed)
New message   Rehab wanted her to call and let you know about the chest pain   Pt c/o of Chest Pain: STAT if CP now or developed within 24 hours  1. Are you having CP right now? No   2. Are you experiencing any other symptoms (ex. SOB, nausea, vomiting, sweating)?  no  3. How long have you been experiencing CP?  Off and on for a few days   4. Is your CP continuous or coming and going? Coming and going  5. Have you taken Nitroglycerin?  No

## 2017-03-31 ENCOUNTER — Encounter (HOSPITAL_COMMUNITY): Payer: Self-pay

## 2017-03-31 DIAGNOSIS — I252 Old myocardial infarction: Secondary | ICD-10-CM | POA: Insufficient documentation

## 2017-03-31 DIAGNOSIS — Z955 Presence of coronary angioplasty implant and graft: Secondary | ICD-10-CM | POA: Insufficient documentation

## 2017-04-03 ENCOUNTER — Encounter (HOSPITAL_COMMUNITY): Payer: Self-pay

## 2017-04-05 ENCOUNTER — Encounter (HOSPITAL_COMMUNITY): Payer: Self-pay

## 2017-04-07 ENCOUNTER — Encounter (HOSPITAL_COMMUNITY): Payer: Self-pay

## 2017-04-10 ENCOUNTER — Encounter (HOSPITAL_COMMUNITY)
Admission: RE | Admit: 2017-04-10 | Discharge: 2017-04-10 | Disposition: A | Discharge status: Home / Self Care | Payer: Self-pay | Source: Ambulatory Visit | Attending: Cardiovascular Disease | Admitting: Cardiovascular Disease

## 2017-04-12 ENCOUNTER — Encounter (HOSPITAL_COMMUNITY): Payer: Self-pay

## 2017-04-14 ENCOUNTER — Encounter (HOSPITAL_COMMUNITY): Payer: Self-pay

## 2017-04-17 ENCOUNTER — Telehealth: Payer: Self-pay | Admitting: Cardiovascular Disease

## 2017-04-17 ENCOUNTER — Encounter (HOSPITAL_COMMUNITY): Payer: Self-pay

## 2017-04-17 NOTE — Telephone Encounter (Signed)
I spoke with the pt and she did not attend cardiac rehab today.  The pt states she started feeling light headed last Friday and over the past few days the symptoms have gradually improved.  Due to the pt's slower heart rate Dr Burt Knack advised that she stop metoprolol tartrate to see if her symptoms improve.  The pt agreed with plan and will contact the office with any additional questions or concerns.

## 2017-04-17 NOTE — Telephone Encounter (Signed)
New Message  Pt call requesting to speak with RN. Pt states Friday she was lightheaded. She states her pulse was 52. Pt states she called PCP; who told her if it got below 50 she would need to go to ER. This morning pt states she is feeling lightheaded. Please call back to discuss

## 2017-04-19 ENCOUNTER — Encounter (HOSPITAL_COMMUNITY)
Admission: RE | Admit: 2017-04-19 | Discharge: 2017-04-19 | Disposition: A | Discharge status: Home / Self Care | Payer: Self-pay | Source: Ambulatory Visit | Attending: Cardiovascular Disease | Admitting: Cardiovascular Disease

## 2017-04-21 ENCOUNTER — Telehealth: Payer: Self-pay | Admitting: Cardiovascular Disease

## 2017-04-21 ENCOUNTER — Encounter (HOSPITAL_COMMUNITY): Payer: Self-pay

## 2017-04-21 MED ORDER — METOPROLOL TARTRATE 25 MG PO TABS: 12.5000 mg | ORAL_TABLET | Freq: Two times a day (BID) | ORAL | 3 refills | Status: DC

## 2017-04-21 NOTE — Telephone Encounter (Signed)
°  New Message   pt verbalized that she is returning call for rn   From last conversation  She did not disclose any more information

## 2017-04-21 NOTE — Telephone Encounter (Signed)
Patient aware of recommendations.  Med list updated.

## 2017-04-21 NOTE — Telephone Encounter (Signed)
Agree - would go back on metoprolol at previous dose. Drink plenty of fluids. thanks

## 2017-04-21 NOTE — Telephone Encounter (Signed)
Returned call to patient-patient reports she was experiencing lightheadedness earlier this week and was instructed to stop her metoprolol (12.5mg  BID) see telephone note.  Reports since stopping medication she is now experiencing palpitations, could not sleep last night because she felt like her heart was "pounding out of her chest".  Reports she took her BP and HR this am 118/72 HR 111, 90 resting.  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  States she would rather have the slight lightheadedness than palpitations.   Patient would like to restart her metoprolol. Patient states she will take her morning usual dose (12.5mg ) and will wait for further instructions.   Advised I would route to Dr. Burt Knack for recommendations. Patient requesting call on cell phone after 12:30 [phone].

## 2017-04-24 ENCOUNTER — Encounter (HOSPITAL_COMMUNITY): Payer: Self-pay

## 2017-04-26 ENCOUNTER — Encounter (HOSPITAL_COMMUNITY)
Admission: RE | Admit: 2017-04-26 | Discharge: 2017-04-26 | Disposition: A | Discharge status: Home / Self Care | Payer: Self-pay | Source: Ambulatory Visit | Attending: Cardiovascular Disease | Admitting: Cardiovascular Disease

## 2017-04-28 ENCOUNTER — Encounter (HOSPITAL_COMMUNITY): Payer: Self-pay

## 2017-06-02 ENCOUNTER — Ambulatory Visit (INDEPENDENT_AMBULATORY_CARE_PROVIDER_SITE_OTHER): Payer: Medicare Other | Admitting: Cardiovascular Disease

## 2017-06-02 ENCOUNTER — Encounter: Payer: Self-pay | Admitting: Cardiovascular Disease

## 2017-06-02 VITALS — BP 126/76 | HR 55 | Ht 65.0 in | Wt 220.4 lb

## 2017-06-02 DIAGNOSIS — I25119 Atherosclerotic heart disease of native coronary artery with unspecified angina pectoris: Secondary | ICD-10-CM

## 2017-06-02 DIAGNOSIS — I10 Essential (primary) hypertension: Secondary | ICD-10-CM | POA: Diagnosis not present

## 2017-06-02 DIAGNOSIS — E785 Hyperlipidemia, unspecified: Secondary | ICD-10-CM

## 2017-06-02 DIAGNOSIS — I5032 Chronic diastolic (congestive) heart failure: Secondary | ICD-10-CM | POA: Diagnosis not present

## 2017-06-02 LAB — BASIC METABOLIC PANEL
BUN / CREAT RATIO: 16 (ref 12–28)
BUN: 18 mg/dL (ref 8–27)
CHLORIDE: 104 mmol/L (ref 96–106)
CO2: 22 mmol/L (ref 20–29)
Calcium: 9.7 mg/dL (ref 8.7–10.3)
Creatinine, Ser: 1.1 mg/dL — ABNORMAL HIGH (ref 0.57–1.00)
GFR calc non Af Amer: 50 mL/min/{1.73_m2} — ABNORMAL LOW (ref >59)
GFR, EST AFRICAN AMERICAN: 58 mL/min/{1.73_m2} — AB (ref >59)
Glucose: 95 mg/dL (ref 65–99)
POTASSIUM: 4.7 mmol/L (ref 3.5–5.2)
SODIUM: 142 mmol/L (ref 134–144)

## 2017-06-02 NOTE — Progress Notes (Signed)
Cardiology Office Note Date:  06/02/2017   ID:  Donna, Williamson 05/13/1946, MRN [id number]  PCP:  Crist Infante, MD  Cardiologist:  Sherren Mocha, MD    Chief Complaint  Patient presents with  . Follow-up    CAD     History of Present Illness: Donna Williamson is a 71 y.o. female who presents for Follow-up of coronary artery disease. The patient initially presented in July 2017 with a non-ST elevation infarction. She was found to have severe stenosis in the first obtuse marginal branch of the circumflex and was treated with PCI using a Synergy drug-eluting stent. Her hospitalization was also complicated by acute diastolic heart failure with moderate pulmonary hypertension and elevated LVEDP. She improved significantly with diuresis and medical therapy.  She is here with her husband today. She is doing well. Since she completed cardiac rehabilitation she hasn't been doing as much walking. She denies any exertional symptoms. She has a little bit of leg swelling on the days she doesn't take furosemide. Otherwise no specific complaints. Notes her breathing is markedly improved over the last year. Denies chest pain or pressure. No orthopnea, PND, or heart palpitations.   Past Medical History:  Diagnosis Date  . Anemia   . Anxiety   . Arthritis    "feet, knees, back" (05/02/2016)  . Asthma   . CAD (coronary artery disease)    a. 04/2016: NSTEMI 95% stenosis 1st Mrg (s/p DES)  . Chest pain    a. 10/2013 Exercise Myoview: Ef 65%, no ischemia.  . Chronic bronchitis ([place])   . Chronic lower back pain   . GERD (gastroesophageal reflux disease)   . History of blood transfusion 1960s   "related to my hysterectomy"  . Hypertension   . Mitral valve prolapse   . NSTEMI (non-ST elevated myocardial infarction) ([place]) 05/01/2016  . Pneumonia 05/01/2016   "saw trace of slight pneumonia/CT scan" (05/02/2016)  . PONV (postoperative nausea and vomiting)   . Seasonal allergies   . Sjogren's  syndrome [facility])     Past Surgical History:  Procedure Laterality Date  . ABDOMINAL HYSTERECTOMY  1960s   "partial"  . CARDIAC CATHETERIZATION N/A 05/02/2016   Procedure: Right/Left Heart Cath and Coronary Angiography;  Surgeon: Jettie Booze, MD;  Location: [facility];  Service: Cardiovascular;  Laterality: N/A;  . CARDIAC CATHETERIZATION N/A 05/02/2016   Procedure: Coronary Stent Intervention;  Surgeon: Jettie Booze, MD;  Location: [facility];  Service: Cardiovascular;  Laterality: N/A;  . COLONOSCOPY W/ BIOPSIES AND POLYPECTOMY    . CORONARY ANGIOPLASTY WITH STENT PLACEMENT  05/02/2016   "1 stent"  . ESOPHAGOGASTRODUODENOSCOPY    . JOINT REPLACEMENT    . LAPAROSCOPIC SALPINGOOPHERECTOMY Bilateral ~ 1990  . TOTAL HIP ARTHROPLASTY Left 2007    Current Outpatient Prescriptions  Medication Sig Dispense Refill  . albuterol (PROVENTIL) (2.5 MG/3ML) 0.083% nebulizer solution Take 3 mLs (2.5 mg total) by nebulization every 2 (two) hours as needed for wheezing or shortness of breath. 75 mL 2  . ALPRAZolam (XANAX) 0.5 MG tablet Take 0.5 mg by mouth daily as needed for anxiety.     Marland Kitchen aspirin EC 81 MG tablet Take 81 mg by mouth daily.    . Cholecalciferol (VITAMIN D3) 2000 UNITS TABS Take 1 tablet by mouth daily.    . cyanocobalamin 500 MCG tablet Take 500 mcg by mouth daily.    Marland Kitchen diltiazem (CARDIZEM CD) 240 MG 24 hr capsule Take 240 mg  by mouth daily.     Marland Kitchen EPIPEN 2-PAK 0.3 MG/0.3ML SOAJ injection Inject 0.3 mg into the skin daily as needed (allergic reaction).     Marland Kitchen estradiol (ESTRACE) 1 MG tablet Take 0.5 mg by mouth daily.     Marland Kitchen FLUoxetine (PROZAC) 10 MG tablet Take 10 mg by mouth daily.    . fluticasone (FLONASE) 50 MCG/ACT nasal spray Place 2 sprays into the nose daily.    . furosemide (LASIX) 20 MG tablet Take 20 mg by mouth every other day.     . gabapentin (NEURONTIN) 100 MG capsule Take 100 mg by mouth daily.     . isosorbide mononitrate (IMDUR) 30 MG 24 hr  tablet Take 15 mg by mouth daily. TAKE HALF TABLET OF 30 MG BY MOUTH (15 MG TOTAL) ONCE DAILY  1  . metoprolol tartrate (LOPRESSOR) 25 MG tablet Take 0.5 tablets (12.5 mg total) by mouth 2 (two) times daily. 180 tablet 3  . montelukast (SINGULAIR) 10 MG tablet Take 10 mg by mouth at bedtime.    . Multiple Vitamin (MULTIVITAMIN WITH MINERALS) TABS tablet Take 1 tablet by mouth daily.    . mupirocin ointment (BACTROBAN) 2 % Apply 1 application topically daily as needed (skin irritation).   0  . nitroGLYCERIN (NITROSTAT) 0.4 MG SL tablet Place 1 tablet (0.4 mg total) under the tongue every 5 (five) minutes as needed for chest pain. 25 tablet prn  . Olopatadine HCl (PATADAY) 0.2 % SOLN Place 1-2 drops into both eyes daily as needed (allergies).    . pantoprazole (PROTONIX) 40 MG tablet Take 1 tablet (40 mg total) by mouth daily. 90 tablet 3  . PROAIR HFA 108 (90 BASE) MCG/ACT inhaler Inhale 1-2 puffs into the lungs every 4 (four) hours as needed for wheezing.     . rosuvastatin (CRESTOR) 20 MG tablet Take 1 tablet (20 mg total) by mouth daily at 6 PM. 30 tablet 0  . triamcinolone cream (KENALOG) 0.1 % Apply 1 application topically 2 (two) times daily as needed (skin irritation).     No current facility-administered medications for this visit.     Allergies:   Levaquin [levofloxacin]; Codeine; Macrobid [nitrofurantoin]; Ciprofloxacin; Penicillins; and Sulfa antibiotics   Social History:  The patient  reports that she has never smoked. She has never used smokeless tobacco. She reports that she does not drink alcohol or use drugs.   Family History:  The patient's  family history includes Heart disease in her father; Hypertension in her mother; Ovarian cancer in her sister; Stroke in her mother.    ROS:  Please see the history of present illness.  Otherwise, review of systems is positive for poor balance.  All other systems are reviewed and negative.    PHYSICAL EXAM: VS:  BP 126/76   Pulse (!) 55    Ht 5\' 5"  (1.651 m)   Wt 220 lb 6.4 oz (100 kg)   BMI 36.68 kg/m  , BMI Body mass index is 36.68 kg/m. GEN: Well nourished, well developed, in no acute distress  HEENT: normal  Neck: no JVD, no masses. No carotid bruits Cardiac: RRR without murmur or gallop                Respiratory:  clear to auscultation bilaterally, normal work of breathing GI: soft, nontender, nondistended, + BS MS: no deformity or atrophy  Ext: no pretibial edema, pedal pulses 2+= bilaterally Skin: warm and dry, no rash Neuro:  Strength and sensation are intact  Psych: euthymic mood, full affect  EKG:  EKG is ordered today. The ekg ordered today shows sinus bradycardia 55 bpm, otherwise within normal limits.   Recent Labs: 07/30/2016: Hemoglobin 9.6; Platelets 296 08/09/2016: ALT 12 09/05/2016: BUN 25; Creat 1.25; Potassium 4.3; Sodium 139   Lipid Panel     Component Value Date/Time   CHOL 132 08/09/2016 0738   TRIG 201 (H) 08/09/2016 0738   HDL 42 (L) 08/09/2016 0738   CHOLHDL 3.1 08/09/2016 0738   VLDL 40 (H) 08/09/2016 0738   LDLCALC 50 08/09/2016 0738      Wt Readings from Last 3 Encounters:  06/02/17 220 lb 6.4 oz (100 kg)  11/07/16 219 lb (99.3 kg)  10/06/16 222 lb 10.6 oz (101 kg)     Cardiac Studies Reviewed: Cardiac Cath 05-02-2016: Conclusion    Prox LAD to Mid LAD lesion, 30% stenosed.  Mid LAD lesion, 50% stenosed at bifurcation of Ost 2nd Diag to 2nd Diag lesion, 70% stenosed.  1st Mrg-1 lesion, 95% stenosed. Post intervention with a 2.5 x 16 Synergy drug eluting stent post dilated to 2.8 mm, there is a 0% residual stenosis.  Moderate pulmonary HTN.  Elevated LVEDP. CO 8.7 L/min. CI 3.96.   Continue dual antiplatelet therapy ideally for a year. She does have anemia but has had bleeding source is ruled out. Brilinta started. She would be a candidate for TWILIGHT study.  She will need some diuresis over the next few days.   Indications   Acute diastolic heart failure (HCC)  [I50.31 (ICD-10-CM)]  NSTEMI (non-ST elevated myocardial infarction) ([place]) [I21.4 (TJQ-30-SP)]  Complications   Complications documented in old activity   The risks, benefits, and details of the procedure were explained to the patient. The patient verbalized understanding and wanted to proceed. Informed written consent was obtained.   PROCEDURE TECHNIQUE: After Xylocaine anesthesia a 48F slender sheath was placed in the right radial artery with a single anterior needle wall stick.  IV Heparin was given. Right coronary angiography was done using a Judkins R4 guide catheter. Left coronary angiography was done using a Judkins L3.5 guide catheter. Left heart cath was done using a JR4 catheter. A TR band was used for hemostasis.    IV Angiomax given for anticoagulation. ACT was used to check that the Angiomax was therapeutic. A CLS 3 guide catheter was used. A pro-water wire was placed across the lesion. A 2.5 x 15 balloon was used to predilate. A 2.5 x 16 balloon was then placed across the lesion and deployed at high pressure. A 2.75 Marlin balloon was used to post dilate. There was an excellent angiographic result with no residual stenosis. The patient tolerated the procedure well.   Contrast: 130 cc   During this procedure the patient is administered a total of Versed 2 mg and Fentanyl 50 mg to achieve and maintain moderate conscious sedation. The patient's heart rate, blood pressure, and oxygen saturation are monitored continuously during the procedure. The period of conscious sedation is 50 minutes, of which I was present face-to-face 100% of this time.   Estimated blood loss <50 mL. There were no immediate complications during the procedure.      Coronary Findings   Dominance: Co-dominant  Left Anterior Descending  Prox LAD to Mid LAD lesion, 30% stenosed. Calcified.  Mid LAD lesion, 50% stenosed. located at the major branch.  Second Diagonal [employer] 2nd Diag to 2nd Diag lesion,  70% stenosed.  Left Circumflex  First Obtuse Marginal Branch  1st Mrg-1 lesion, 95% stenosed. Thrombotic.  PCI: The pre-interventional distal flow is normal (TIMI 3). Pre-stent angioplasty was performed. A drug-eluting stent was placed. The strut is apposed. Post-stent angioplasty was performed. The post-interventional distal flow is normal (TIMI 3). The intervention was successful. No complications occurred at this lesion.  Supplies used: BALLOON EMERGE MR 2.5X15; STENT SYNERGY DES 2.5X16; BALLOON Onward EMERGE MR [id number]  There is no residual stenosis post intervention.  1st Mrg-2 lesion, 30% stenosed.  Right Coronary Artery  The vessel exhibits minimal luminal irregularities.  Right Heart   Right Heart Pressures Hemodynamic findings consistent with moderate pulmonary hypertension. Elevated LV EDP consistent with volume overload.    Left Heart   Left Ventricle Elevated LVEDP.    Aortic Valve There is no aortic valve stenosis.    Coronary Diagrams   Diagnostic Diagram       Post-Intervention Diagram          ASSESSMENT AND PLAN: 1.  Coronary artery disease, native vessel, without angina: The patient is now out beyond 12 months from her non-STEMI and PCI. Per guideline recommendation she can discontinue clopidogrel. She understands the need for lifelong aspirin 81 mg. The patient has functional class I symptoms. She will return in one year for follow-up.  2. Chronic diastolic heart failure: The patient's symptoms are well controlled. We'll check a metabolic panel today and consider daily low-dose furosemide as long as her renal function is okay. She has her annual physical with Dr. Joylene Draft coming up at the end of the month and that would be a good time to follow-up her lab work if we do in fact increase her furosemide.  3. Hyperlipidemia: Treated with Crestor 20 mg daily.  4. Hypertension: Blood pressure is very well controlled on a combination of diltiazem, furosemide, isosorbide,  and metoprolol.  Current medicines are reviewed with the patient today.  The patient does not have concerns regarding medicines.  Labs/ tests ordered today include:   Orders Placed This Encounter  Procedures  . Basic metabolic panel  . AMB referral to Cardiac Rehabilitation Maintenance Program ([facility] Only)  . EKG 12-Lead    Disposition:   FU one year  Signed, Sherren Mocha, MD  06/02/2017 1:34 PM    [facility] [address], [place], [place]  [address] Phone: [phone]; Fax: [phone]

## 2017-06-02 NOTE — Patient Instructions (Addendum)
Medication Instructions:  Your physician has recommended you make the following change in your medication:  1. STOP Plavix  Labwork: Your physician recommends that you have lab work today: BMP  Testing/Procedures: No new orders.   Follow-Up: Your physician wants you to follow-up in: 1 YEAR with Dr Burt Knack.  You will receive a reminder letter in the mail two months in advance. If you don't receive a letter, please call our office to schedule the follow-up appointment.  You have been referred to Maintenance Cardiac Rehabilitation    Any Other Special Instructions Will Be Listed Below (If Applicable).     If you need a refill on your cardiac medications before your next appointment, please call your pharmacy.

## 2017-06-05 ENCOUNTER — Telehealth: Payer: Self-pay | Admitting: Cardiovascular Disease

## 2017-06-05 MED ORDER — FUROSEMIDE 20 MG PO TABS: 20.0000 mg | ORAL_TABLET | Freq: Every day | ORAL | 3 refills | Status: DC

## 2017-06-05 NOTE — Telephone Encounter (Signed)
Pt has been notified of lab results and recommendations per Dr. Burt Knack ok to take lasix 20 mg daily. Pt asked for new Rx to be sent into [facility] on [address]. Pt will f/u Dr. Joylene Draft as planned. Pt thanked me for my call.

## 2017-06-05 NOTE — Telephone Encounter (Signed)
New message     If you do not get her before 1230p call on cell  Returning [place] call for lab results

## 2017-06-05 NOTE — Telephone Encounter (Signed)
-----   Message from Sherren Mocha, MD sent at 06/02/2017  5:11 PM EDT ----- Labs look ok. Would be ok to increase furosemide to 20 mg daily and FU labs later this month with Dr Joylene Draft as planned.

## 2017-07-05 ENCOUNTER — Encounter (HOSPITAL_COMMUNITY): Payer: Medicare Other

## 2017-07-07 ENCOUNTER — Encounter (HOSPITAL_COMMUNITY): Payer: Medicare Other

## 2017-07-10 ENCOUNTER — Encounter (HOSPITAL_COMMUNITY): Payer: Medicare Other

## 2017-07-12 ENCOUNTER — Encounter (HOSPITAL_COMMUNITY): Payer: Medicare Other

## 2017-07-13 ENCOUNTER — Ambulatory Visit (HOSPITAL_COMMUNITY)
Admission: EM | Admit: 2017-07-13 | Discharge: 2017-07-13 | Disposition: A | Discharge status: Home / Self Care | Payer: Medicare Other | Attending: Family Medicine | Admitting: Family Medicine

## 2017-07-13 ENCOUNTER — Encounter (HOSPITAL_COMMUNITY): Payer: Self-pay | Admitting: Family Medicine

## 2017-07-13 DIAGNOSIS — H01025 Squamous blepharitis left lower eyelid: Secondary | ICD-10-CM

## 2017-07-13 DIAGNOSIS — H01024 Squamous blepharitis left upper eyelid: Secondary | ICD-10-CM

## 2017-07-13 DIAGNOSIS — H01021 Squamous blepharitis right upper eyelid: Secondary | ICD-10-CM | POA: Diagnosis not present

## 2017-07-13 DIAGNOSIS — H0102B Squamous blepharitis left eye, upper and lower eyelids: Principal | ICD-10-CM

## 2017-07-13 DIAGNOSIS — H01022 Squamous blepharitis right lower eyelid: Secondary | ICD-10-CM | POA: Diagnosis not present

## 2017-07-13 DIAGNOSIS — H0102A Squamous blepharitis right eye, upper and lower eyelids: Secondary | ICD-10-CM

## 2017-07-13 MED ORDER — PREDNISONE 10 MG (21) PO TBPK: ORAL_TABLET | ORAL | 0 refills | Status: DC

## 2017-07-13 MED ORDER — DOXYCYCLINE HYCLATE 100 MG PO CAPS: 100.0000 mg | ORAL_CAPSULE | Freq: Two times a day (BID) | ORAL | 0 refills | Status: DC

## 2017-07-13 NOTE — ED Triage Notes (Signed)
Pt here for 2 weeks of bilateral eye redness, swelling, itching and drainage. Using pataday, zyrtec. sts also trying zatador and no relief.

## 2017-07-13 NOTE — Discharge Instructions (Signed)
Continue warm compresses along the eye, I have prescribed a prednisone taper, along with doxycycline, I recommend trying prednisone first, as she may or may not need the doxycycline. If symptoms persist, follow-up with your regular doctor, your eye doctor, or return to clinic

## 2017-07-13 NOTE — ED Provider Notes (Signed)
[facility]   [id number] 07/13/17 Arrival Time: 0932   SUBJECTIVE:  Donna Williamson is a 71 y.o. female who presents to the urgent care with complaint of  Bilateral eye pain. States she has had this condition before several times and usually requires prednisone and doxycyline to clear it. She has tried warm compresses, Zyrtec, Pataday, and Zaditor with minimal relief.     Past Medical History:  Diagnosis Date  . Anemia   . Anxiety   . Arthritis    "feet, knees, back" (05/02/2016)  . Asthma   . CAD (coronary artery disease)    a. 04/2016: NSTEMI 95% stenosis 1st Mrg (s/p DES)  . Chest pain    a. 10/2013 Exercise Myoview: Ef 65%, no ischemia.  . Chronic bronchitis ([place])   . Chronic lower back pain   . GERD (gastroesophageal reflux disease)   . History of blood transfusion 1960s   "related to my hysterectomy"  . Hypertension   . Mitral valve prolapse   . NSTEMI (non-ST elevated myocardial infarction) ([place]) 05/01/2016  . Pneumonia 05/01/2016   "saw trace of slight pneumonia/CT scan" (05/02/2016)  . PONV (postoperative nausea and vomiting)   . Seasonal allergies   . Sjogren's syndrome ([place])    Family History  Problem Relation Age of Onset  . Hypertension Mother   . Stroke Mother   . Heart disease Father   . Ovarian cancer Sister    Social History   Social History  . Marital status: Married    Spouse name: N/A  . Number of children: N/A  . Years of education: N/A   Occupational History  . Not on file.   Social History Main Topics  . Smoking status: Never Smoker  . Smokeless tobacco: Never Used  . Alcohol use No  . Drug use: No  . Sexual activity: Yes   Other Topics Concern  . Not on file   Social History Narrative   Lives in [place] with husband.  Active but does not routinely exercise.   No outpatient prescriptions have been marked as taking for the 07/13/17 encounter [facility] Encounter).   Allergies  Allergen Reactions  . Levaquin [Levofloxacin]  Anaphylaxis  . Codeine Nausea Only  . Macrobid [Nitrofurantoin] Other (See Comments)    unknown  . Ciprofloxacin Rash  . Penicillins Rash  . Sulfa Antibiotics Rash      ROS: As per HPI, remainder of ROS negative.   OBJECTIVE:   Vitals:   07/13/17 1824  BP: (!) 151/67  Pulse: 62  Resp: 18  SpO2: 96%     General appearance: alert; no distress Eyes: With erythemic and edematous eyelids, along with crusting at the lateral canthus HENT: normocephalic; atraumatic Neck: supple Lungs: clear to auscultation bilaterally Heart: regular rate and rhythm Abdomen: soft, non-tender; Extremities: no cyanosis or edema; symmetrical with no gross deformities Skin: warm and dry Neurologic: normal gait; grossly normal Psychological: alert and cooperative; normal mood and affect          Labs:   Labs Reviewed - No data to display  No results found.     ASSESSMENT & PLAN:  1. Squamous blepharitis of upper and lower eyelids of both eyes     Meds ordered this encounter  Medications  . doxycycline (VIBRAMYCIN) 100 MG capsule    Sig: Take 1 capsule (100 mg total) by mouth 2 (two) times daily.    Dispense:  20 capsule    Refill:  0    Order  Specific Question:   Supervising Provider    Answer:   Vanessa Kick [[id number]]  . predniSONE (STERAPRED UNI-PAK 21 TAB) 10 MG (21) TBPK tablet    Sig: Take 6 tablets tomorrow, decrease by 1 each day till finished (6,5,4,3,2,1)    Dispense:  21 tablet    Refill:  0    Order Specific Question:   Supervising Provider    Answer:   Vanessa Kick [[id number]]   Discussed case with attending physician, will treat with prednisone and doxycyline. Reviewed expectations re: course of current medical issues. Questions answered. Outlined signs and symptoms indicating need for more acute intervention. Patient verbalized understanding. After Visit Summary given.    Procedures:        Barnet Glasgow, NP 07/13/17 [phone]

## 2017-07-14 ENCOUNTER — Encounter (HOSPITAL_COMMUNITY): Payer: Medicare Other

## 2017-07-17 ENCOUNTER — Encounter (HOSPITAL_COMMUNITY): Payer: Medicare Other

## 2017-07-19 ENCOUNTER — Encounter (HOSPITAL_COMMUNITY): Payer: Medicare Other

## 2017-07-21 ENCOUNTER — Encounter (HOSPITAL_COMMUNITY): Payer: Medicare Other

## 2017-07-21 ENCOUNTER — Encounter (HOSPITAL_COMMUNITY): Payer: Self-pay | Admitting: Emergency Medicine

## 2017-07-21 ENCOUNTER — Observation Stay (HOSPITAL_COMMUNITY)
Admission: EM | Admit: 2017-07-21 | Discharge: 2017-07-22 | Disposition: A | Discharge status: Home / Self Care | Payer: Medicare Other | Attending: Internal Medicine | Admitting: Internal Medicine

## 2017-07-21 ENCOUNTER — Emergency Department (HOSPITAL_COMMUNITY): Payer: Medicare Other

## 2017-07-21 ENCOUNTER — Telehealth: Payer: Self-pay | Admitting: Cardiovascular Disease

## 2017-07-21 DIAGNOSIS — Z8249 Family history of ischemic heart disease and other diseases of the circulatory system: Secondary | ICD-10-CM | POA: Insufficient documentation

## 2017-07-21 DIAGNOSIS — Z955 Presence of coronary angioplasty implant and graft: Secondary | ICD-10-CM | POA: Insufficient documentation

## 2017-07-21 DIAGNOSIS — M19071 Primary osteoarthritis, right ankle and foot: Secondary | ICD-10-CM | POA: Insufficient documentation

## 2017-07-21 DIAGNOSIS — I10 Essential (primary) hypertension: Secondary | ICD-10-CM | POA: Diagnosis present

## 2017-07-21 DIAGNOSIS — I252 Old myocardial infarction: Secondary | ICD-10-CM | POA: Insufficient documentation

## 2017-07-21 DIAGNOSIS — R001 Bradycardia, unspecified: Secondary | ICD-10-CM | POA: Insufficient documentation

## 2017-07-21 DIAGNOSIS — Z96642 Presence of left artificial hip joint: Secondary | ICD-10-CM | POA: Insufficient documentation

## 2017-07-21 DIAGNOSIS — K219 Gastro-esophageal reflux disease without esophagitis: Secondary | ICD-10-CM | POA: Diagnosis not present

## 2017-07-21 DIAGNOSIS — Z885 Allergy status to narcotic agent status: Secondary | ICD-10-CM | POA: Insufficient documentation

## 2017-07-21 DIAGNOSIS — R0989 Other specified symptoms and signs involving the circulatory and respiratory systems: Secondary | ICD-10-CM | POA: Insufficient documentation

## 2017-07-21 DIAGNOSIS — M170 Bilateral primary osteoarthritis of knee: Secondary | ICD-10-CM | POA: Insufficient documentation

## 2017-07-21 DIAGNOSIS — F419 Anxiety disorder, unspecified: Secondary | ICD-10-CM | POA: Insufficient documentation

## 2017-07-21 DIAGNOSIS — I341 Nonrheumatic mitral (valve) prolapse: Secondary | ICD-10-CM | POA: Insufficient documentation

## 2017-07-21 DIAGNOSIS — J449 Chronic obstructive pulmonary disease, unspecified: Secondary | ICD-10-CM | POA: Diagnosis not present

## 2017-07-21 DIAGNOSIS — M479 Spondylosis, unspecified: Secondary | ICD-10-CM | POA: Insufficient documentation

## 2017-07-21 DIAGNOSIS — Z79899 Other long term (current) drug therapy: Secondary | ICD-10-CM | POA: Insufficient documentation

## 2017-07-21 DIAGNOSIS — Z882 Allergy status to sulfonamides status: Secondary | ICD-10-CM | POA: Insufficient documentation

## 2017-07-21 DIAGNOSIS — E785 Hyperlipidemia, unspecified: Secondary | ICD-10-CM | POA: Diagnosis not present

## 2017-07-21 DIAGNOSIS — Z7982 Long term (current) use of aspirin: Secondary | ICD-10-CM | POA: Diagnosis not present

## 2017-07-21 DIAGNOSIS — M19072 Primary osteoarthritis, left ankle and foot: Secondary | ICD-10-CM | POA: Insufficient documentation

## 2017-07-21 DIAGNOSIS — G629 Polyneuropathy, unspecified: Secondary | ICD-10-CM | POA: Insufficient documentation

## 2017-07-21 DIAGNOSIS — Z881 Allergy status to other antibiotic agents status: Secondary | ICD-10-CM | POA: Insufficient documentation

## 2017-07-21 DIAGNOSIS — N183 Chronic kidney disease, stage 3 (moderate): Secondary | ICD-10-CM | POA: Diagnosis not present

## 2017-07-21 DIAGNOSIS — Z880 Allergy status to penicillin: Secondary | ICD-10-CM | POA: Insufficient documentation

## 2017-07-21 DIAGNOSIS — R079 Chest pain, unspecified: Secondary | ICD-10-CM | POA: Diagnosis not present

## 2017-07-21 DIAGNOSIS — F329 Major depressive disorder, single episode, unspecified: Secondary | ICD-10-CM | POA: Diagnosis not present

## 2017-07-21 DIAGNOSIS — I2510 Atherosclerotic heart disease of native coronary artery without angina pectoris: Secondary | ICD-10-CM | POA: Diagnosis not present

## 2017-07-21 DIAGNOSIS — I5032 Chronic diastolic (congestive) heart failure: Secondary | ICD-10-CM | POA: Diagnosis not present

## 2017-07-21 DIAGNOSIS — F32A Depression, unspecified: Secondary | ICD-10-CM | POA: Diagnosis present

## 2017-07-21 DIAGNOSIS — M3500 Sicca syndrome, unspecified: Secondary | ICD-10-CM | POA: Insufficient documentation

## 2017-07-21 DIAGNOSIS — R0789 Other chest pain: Secondary | ICD-10-CM | POA: Diagnosis not present

## 2017-07-21 DIAGNOSIS — I130 Hypertensive heart and chronic kidney disease with heart failure and stage 1 through stage 4 chronic kidney disease, or unspecified chronic kidney disease: Secondary | ICD-10-CM | POA: Diagnosis not present

## 2017-07-21 LAB — CBC WITH DIFFERENTIAL/PLATELET
BASOS PCT: 0 %
Basophils Absolute: 0 10*3/uL (ref 0.0–0.1)
EOS ABS: 0.6 10*3/uL (ref 0.0–0.7)
EOS PCT: 3 %
HCT: 34.9 % — ABNORMAL LOW (ref 36.0–46.0)
Hemoglobin: 11.4 g/dL — ABNORMAL LOW (ref 12.0–15.0)
LYMPHS ABS: 4 10*3/uL (ref 0.7–4.0)
Lymphocytes Relative: 25 %
MCH: 29.5 pg (ref 26.0–34.0)
MCHC: 32.7 g/dL (ref 30.0–36.0)
MCV: 90.2 fL (ref 78.0–100.0)
MONO ABS: 1.1 10*3/uL — AB (ref 0.1–1.0)
MONOS PCT: 7 %
NEUTROS PCT: 65 %
Neutro Abs: 10.4 10*3/uL — ABNORMAL HIGH (ref 1.7–7.7)
PLATELETS: 364 10*3/uL (ref 150–400)
RBC: 3.87 MIL/uL (ref 3.87–5.11)
RDW: 13.4 % (ref 11.5–15.5)
WBC: 16.1 10*3/uL — ABNORMAL HIGH (ref 4.0–10.5)

## 2017-07-21 LAB — CBC
HEMATOCRIT: 37.7 % (ref 36.0–46.0)
Hemoglobin: 12.2 g/dL (ref 12.0–15.0)
MCH: 29.3 pg (ref 26.0–34.0)
MCHC: 32.4 g/dL (ref 30.0–36.0)
MCV: 90.4 fL (ref 78.0–100.0)
Platelets: 385 10*3/uL (ref 150–400)
RBC: 4.17 MIL/uL (ref 3.87–5.11)
RDW: 13.5 % (ref 11.5–15.5)
WBC: 16 10*3/uL — ABNORMAL HIGH (ref 4.0–10.5)

## 2017-07-21 LAB — COMPREHENSIVE METABOLIC PANEL
ALBUMIN: 3.3 g/dL — AB (ref 3.5–5.0)
ALT: 15 U/L (ref 14–54)
AST: 18 U/L (ref 15–41)
Alkaline Phosphatase: 86 U/L (ref 38–126)
Anion gap: 8 (ref 5–15)
BUN: 32 mg/dL — AB (ref 6–20)
CHLORIDE: 106 mmol/L (ref 101–111)
CO2: 24 mmol/L (ref 22–32)
CREATININE: 1.24 mg/dL — AB (ref 0.44–1.00)
Calcium: 8.9 mg/dL (ref 8.9–10.3)
GFR calc Af Amer: 49 mL/min — ABNORMAL LOW (ref >60)
GFR, EST NON AFRICAN AMERICAN: 42 mL/min — AB (ref >60)
GLUCOSE: 92 mg/dL (ref 65–99)
POTASSIUM: 4.1 mmol/L (ref 3.5–5.1)
SODIUM: 138 mmol/L (ref 135–145)
Total Bilirubin: 0.4 mg/dL (ref 0.3–1.2)
Total Protein: 6.3 g/dL — ABNORMAL LOW (ref 6.5–8.1)

## 2017-07-21 LAB — I-STAT TROPONIN, ED: Troponin i, poc: 0.01 ng/mL (ref 0.00–0.08)

## 2017-07-21 LAB — CREATININE, SERUM
Creatinine, Ser: 1.28 mg/dL — ABNORMAL HIGH (ref 0.44–1.00)
GFR calc Af Amer: 47 mL/min — ABNORMAL LOW (ref >60)
GFR, EST NON AFRICAN AMERICAN: 40 mL/min — AB (ref >60)

## 2017-07-21 LAB — TROPONIN I: Troponin I: <0.03 ng/mL (ref <0.03)

## 2017-07-21 MED ORDER — ALPRAZOLAM 0.5 MG PO TABS: 0.5000 mg | ORAL_TABLET | Freq: Every day | ORAL | Status: DC | PRN

## 2017-07-21 MED ORDER — MORPHINE SULFATE (PF) 2 MG/ML IV SOLN: 2.0000 mg | INTRAVENOUS | Status: DC | PRN

## 2017-07-21 MED ORDER — GI COCKTAIL ~~LOC~~
30.0000 mL | Freq: Four times a day (QID) | ORAL | Status: DC | PRN
Start: 2017-07-21 — End: 2017-07-22
  Administered 2017-07-21: 30 mL via ORAL
  Filled 2017-07-21: qty 30

## 2017-07-21 MED ORDER — ASPIRIN EC 81 MG PO TBEC
81.0000 mg | DELAYED_RELEASE_TABLET | Freq: Every day | ORAL | Status: DC
  Administered 2017-07-22: 81 mg via ORAL
  Filled 2017-07-21: qty 1

## 2017-07-21 MED ORDER — ALBUTEROL SULFATE (2.5 MG/3ML) 0.083% IN NEBU: 3.0000 mL | INHALATION_SOLUTION | RESPIRATORY_TRACT | Status: DC | PRN

## 2017-07-21 MED ORDER — NITROGLYCERIN 2 % TD OINT
0.5000 [in_us] | TOPICAL_OINTMENT | Freq: Once | TRANSDERMAL | Status: DC
  Filled 2017-07-21: qty 1

## 2017-07-21 MED ORDER — ENOXAPARIN SODIUM 40 MG/0.4ML ~~LOC~~ SOLN
40.0000 mg | SUBCUTANEOUS | Status: DC
  Administered 2017-07-21: 40 mg via SUBCUTANEOUS
  Filled 2017-07-21: qty 0.4

## 2017-07-21 MED ORDER — DOXYCYCLINE HYCLATE 100 MG PO TABS: 100.0000 mg | ORAL_TABLET | Freq: Two times a day (BID) | ORAL | Status: DC

## 2017-07-21 MED ORDER — ONDANSETRON HCL 4 MG/2ML IJ SOLN: 4.0000 mg | Freq: Four times a day (QID) | INTRAMUSCULAR | Status: DC | PRN

## 2017-07-21 MED ORDER — MUPIROCIN 2 % EX OINT: 1.0000 "application " | TOPICAL_OINTMENT | Freq: Every day | CUTANEOUS | Status: DC | PRN

## 2017-07-21 MED ORDER — NITROGLYCERIN 0.4 MG SL SUBL: 0.4000 mg | SUBLINGUAL_TABLET | SUBLINGUAL | Status: DC | PRN

## 2017-07-21 MED ORDER — ACETAMINOPHEN 325 MG PO TABS: 650.0000 mg | ORAL_TABLET | ORAL | Status: DC | PRN

## 2017-07-21 NOTE — H&P (Signed)
[facility] History and Physical  Donna Williamson NFA:[id number] DOB: [birth year] DOA: 07/21/2017  PCP: Crist Infante, MD  Patient coming from: Home  Chief Complaint: Chest pain  HPI: Donna Williamson is a 71 y.o. female with a medical history of coronary artery disease status post end STEMI July 2017, hypertension, GERD, asthma, who presented to the emergency department with complaints of chest tightness and pain. Patient stated her symptoms of chest tightness started approximately 3 days ago. She was recently placed on prednisone for an eye infection and completed her course of prednisone on the same day. Patient states that she normally has a full feeling at which point she starts having chest tightness, which occurs after eating a large meal, particularly dinner. Pain lasts a few hours and resolves on its own. Patient does have nitro at home, however, has not used any. Patient states she had chest tightness which was reminiscent of her heart attack one year ago. She denied any shortness of breath, nausea, dizziness, diaphoresis, jaw pain, radiation of symptoms. Patient currently feels that her tightness has improved mildly. Denies current shortness of breath, abdominal pain, nausea or vomiting, diarrhea or constipation, recent travel or ill contacts.  ED Course: Complained of chest pain. EKG and troponin unremarkable for ACS. TRH called for observation admission.  Review of Systems:  All other systems reviewed and are negative.   Past Medical History:  Diagnosis Date  . Anemia   . Anxiety   . Arthritis    "feet, knees, back" (05/02/2016)  . Asthma   . CAD (coronary artery disease)    a. 04/2016: NSTEMI 95% stenosis 1st Mrg (s/p DES)  . Chest pain    a. 10/2013 Exercise Myoview: Ef 65%, no ischemia.  . Chronic bronchitis ([place])   . Chronic lower back pain   . GERD (gastroesophageal reflux disease)   . History of blood transfusion 1960s   "related to my hysterectomy"  .  Hypertension   . Mitral valve prolapse   . NSTEMI (non-ST elevated myocardial infarction) ([place]) 05/01/2016  . Pneumonia 05/01/2016   "saw trace of slight pneumonia/CT scan" (05/02/2016)  . PONV (postoperative nausea and vomiting)   . Seasonal allergies   . Sjogren's syndrome [facility])     Past Surgical History:  Procedure Laterality Date  . ABDOMINAL HYSTERECTOMY  1960s   "partial"  . CARDIAC CATHETERIZATION N/A 05/02/2016   Procedure: Right/Left Heart Cath and Coronary Angiography;  Surgeon: Jettie Booze, MD;  Location: [place] [facility];  Service: Cardiovascular;  Laterality: N/A;  . CARDIAC CATHETERIZATION N/A 05/02/2016   Procedure: Coronary Stent Intervention;  Surgeon: Jettie Booze, MD;  Location: [employer] [facility];  Service: Cardiovascular;  Laterality: N/A;  . COLONOSCOPY W/ BIOPSIES AND POLYPECTOMY    . CORONARY ANGIOPLASTY WITH STENT PLACEMENT  05/02/2016   "1 stent"  . ESOPHAGOGASTRODUODENOSCOPY    . JOINT REPLACEMENT    . LAPAROSCOPIC SALPINGOOPHERECTOMY Bilateral ~ 1990  . TOTAL HIP ARTHROPLASTY Left 2007    Social History:  reports that she has never smoked. She has never used smokeless tobacco. She reports that she does not drink alcohol or use drugs.   Allergies  Allergen Reactions  . Levaquin [Levofloxacin] Anaphylaxis  . Codeine Nausea Only  . Macrobid [Nitrofurantoin] Other (See Comments)    unknown  . Ciprofloxacin Rash  . Penicillins Rash  . Sulfa Antibiotics Rash    Family History  Problem Relation Age of Onset  . Hypertension Mother   . Stroke  Mother   . Heart disease Father   . Ovarian cancer Sister     Prior to Admission medications   Medication Sig Start Date End Date Taking? Authorizing Provider  albuterol (PROVENTIL) (2.5 MG/3ML) 0.083% nebulizer solution Take 3 mLs (2.5 mg total) by nebulization every 2 (two) hours as needed for wheezing or shortness of breath. 05/04/16   Hosie Poisson, MD  ALPRAZolam Duanne Moron) 0.5 MG tablet Take 0.5 mg by  mouth daily as needed for anxiety.     [provider]  aspirin EC 81 MG tablet Take 81 mg by mouth daily.    [provider]  Cholecalciferol (VITAMIN D3) 2000 UNITS TABS Take 1 tablet by mouth daily.    [provider]  cyanocobalamin 500 MCG tablet Take 500 mcg by mouth daily.    [provider]  diltiazem (CARDIZEM CD) 240 MG 24 hr capsule Take 240 mg by mouth daily.  11/27/13   [provider]  doxycycline (VIBRAMYCIN) 100 MG capsule Take 1 capsule (100 mg total) by mouth 2 (two) times daily. 07/13/17   Barnet Glasgow, NP  EPIPEN 2-PAK 0.3 MG/0.3ML SOAJ injection Inject 0.3 mg into the skin daily as needed (allergic reaction).  11/01/13   [provider]  estradiol (ESTRACE) 1 MG tablet Take 0.5 mg by mouth daily.     [provider]  FLUoxetine (PROZAC) 10 MG tablet Take 10 mg by mouth daily.    [provider]  fluticasone (FLONASE) 50 MCG/ACT nasal spray Place 2 sprays into the nose daily.    [provider]  furosemide (LASIX) 20 MG tablet Take 1 tablet (20 mg total) by mouth daily. 06/05/17 09/03/17  Sherren Mocha, MD  gabapentin (NEURONTIN) 100 MG capsule Take 100 mg by mouth daily.     [provider]  isosorbide mononitrate (IMDUR) 30 MG 24 hr tablet Take 15 mg by mouth daily. TAKE HALF TABLET OF 30 MG BY MOUTH (15 MG TOTAL) ONCE DAILY 07/31/16   [provider]  metoprolol tartrate (LOPRESSOR) 25 MG tablet Take 0.5 tablets (12.5 mg total) by mouth 2 (two) times daily. 04/21/17 07/20/17  Sherren Mocha, MD  montelukast (SINGULAIR) 10 MG tablet Take 10 mg by mouth at bedtime.    [provider]  Multiple Vitamin (MULTIVITAMIN WITH MINERALS) TABS tablet Take 1 tablet by mouth daily.    [provider]  mupirocin ointment (BACTROBAN) 2 % Apply 1 application topically daily as needed (skin irritation).  03/29/16   [provider]  nitroGLYCERIN (NITROSTAT) 0.4 MG SL  tablet Place 1 tablet (0.4 mg total) under the tongue every 5 (five) minutes as needed for chest pain. 05/06/16   Dorothy Spark, MD  pantoprazole (PROTONIX) 40 MG tablet Take 1 tablet (40 mg total) by mouth daily. 08/01/16   Sherren Mocha, MD  PROAIR HFA 108 (90 BASE) MCG/ACT inhaler Inhale 1-2 puffs into the lungs every 4 (four) hours as needed for wheezing.  11/01/13   [provider]  rosuvastatin (CRESTOR) 20 MG tablet Take 1 tablet (20 mg total) by mouth daily at 6 PM. 05/30/16   Bhagat, Bhavinkumar, PA  triamcinolone cream (KENALOG) 0.1 % Apply 1 application topically 2 (two) times daily as needed (skin irritation).    [provider]    Physical Exam: Vitals:   07/21/17 1500 07/21/17 1515  BP: (!) 142/55   Pulse:  (!) 59  Resp:  16  Temp:    SpO2:  96%  General: Well developed, well nourished, NAD, appears stated age  HEENT: NCAT, PERRLA, EOMI, Anicteic Sclera, mucous membranes moist.   Neck: Supple, no JVD, no masses  Cardiovascular: S1 S2 auscultated, no rubs, murmurs or gallops. Regular rate and rhythm. Mildly TTP with palpation of the sternal area  Respiratory: Clear to auscultation bilaterally with equal chest rise  Abdomen: Soft, obese, nontender, nondistended, + bowel sounds  Extremities: warm dry without cyanosis clubbing or edema  Neuro: AAOx3, cranial nerves grossly intact. Strength 5/5 in patient's upper and lower extremities bilaterally  Skin: Without rashes exudates or nodules  Psych: Normal affect and demeanor with intact judgement and insight, pleasant  Labs on Admission: I have personally reviewed following labs and imaging studies CBC:  Recent Labs Lab 07/21/17 1347  WBC 16.1*  NEUTROABS 10.4*  HGB 11.4*  HCT 34.9*  MCV 90.2  PLT 176   Basic Metabolic Panel:  Recent Labs Lab 07/21/17 1347  NA 138  K 4.1  CL 106  CO2 24  GLUCOSE 92  BUN 32*  CREATININE 1.24*  CALCIUM 8.9   GFR: Estimated Creatinine  Clearance: 46.8 mL/min (A) (by C-G formula based on SCr of 1.24 mg/dL (H)). Liver Function Tests:  Recent Labs Lab 07/21/17 1347  AST 18  ALT 15  ALKPHOS 86  BILITOT 0.4  PROT 6.3*  ALBUMIN 3.3*   No results for input(s): LIPASE, AMYLASE in the last 168 hours. No results for input(s): AMMONIA in the last 168 hours. Coagulation Profile: No results for input(s): INR, PROTIME in the last 168 hours. Cardiac Enzymes: No results for input(s): CKTOTAL, CKMB, CKMBINDEX, TROPONINI in the last 168 hours. BNP (last 3 results) No results for input(s): PROBNP in the last 8760 hours. HbA1C: No results for input(s): HGBA1C in the last 72 hours. CBG: No results for input(s): GLUCAP in the last 168 hours. Lipid Profile: No results for input(s): CHOL, HDL, LDLCALC, TRIG, CHOLHDL, LDLDIRECT in the last 72 hours. Thyroid Function Tests: No results for input(s): TSH, T4TOTAL, FREET4, T3FREE, THYROIDAB in the last 72 hours. Anemia Panel: No results for input(s): VITAMINB12, FOLATE, FERRITIN, TIBC, IRON, RETICCTPCT in the last 72 hours. Urine analysis:    Component Value Date/Time   COLORURINE YELLOW 05/01/2016 0719   APPEARANCEUR HAZY (A) 05/01/2016 0719   LABSPEC 1.006 05/01/2016 0719   PHURINE 6.0 05/01/2016 0719   GLUCOSEU NEGATIVE 05/01/2016 0719   HGBUR NEGATIVE 05/01/2016 0719   BILIRUBINUR NEGATIVE 05/01/2016 0719   BILIRUBINUR neg 02/16/2015 1851   KETONESUR NEGATIVE 05/01/2016 0719   PROTEINUR NEGATIVE 05/01/2016 0719   UROBILINOGEN 0.2 02/16/2015 1851   UROBILINOGEN 0.2 11/29/2013 1552   NITRITE NEGATIVE 05/01/2016 0719   LEUKOCYTESUR SMALL (A) 05/01/2016 0719   Sepsis Labs: @LABRCNTIP (procalcitonin:4,lacticidven:4) )No results found for this or any previous visit (from the past 240 hour(s)).   Radiological Exams on Admission: Dg Chest 2 View  Result Date: 07/21/2017 CLINICAL DATA:  Left chest pain radiating to the left shoulder for the past 3 days. EXAM: CHEST  2 VIEW  COMPARISON:  05/02/2016. FINDINGS: The cardiac silhouette remains mildly enlarged. Mildly prominent pulmonary vasculature with improvement. Decreased prominence of the interstitial markings. No pleural fluid. Thoracic spine degenerative changes. IMPRESSION: No acute abnormality. Mild cardiomegaly and mild pulmonary vascular congestion. Electronically Signed   By: Claudie Revering M.D.   On: 07/21/2017 15:03    EKG: Independently reviewed. Sinus rhythm, rate 66. No change from prior EKG  Assessment/Plan  Chest pain/tightness -EKG shows no changes, troponin unremarkable -will  continue to cycle troponins -Continue nitro and morphine PRN -Feel patient's tightness is related to recent course of steroids and GERD. Patient's pain or tightness is elicited after she eats a big meal. -patient does have risk factors, recently had MI in 2017 -last echocardiogram 05/06/2016 EF 55-60%  -patient follows with Dr. Burt Knack -continue aspirin -on imdur, Cardizem, statin -hold metoprolol as patient is currently bradycardic   Essential hypertension -Metoprolol held due to bradycardia -On diltiazem, Lasix, isosorbide  Recent eye infection -Patient recently completed course of prednisone and doxycycline -Continue eyedrops  Neuropathy -On Neurontin  Asthma -Controlled, continue albuterol as needed  GERD -On Protonix -Will order GI cocktail  Depression -On Prozac  Chronic Diastolic heart failure -Last echocardiogram 2017 unable to evaluate diastolic function -On Lasix -Currently appears to be euvolemic and compensated -Monitor intake and output, daily weights  Chronic kidney disease, stage III -Creatinine appears to be at baseline, continue to monitor BMP  Hyperlipidemia -On statin  DVT prophylaxis: Lovenox  Code Status: Full  Family Communication: Husband at bedside. Admission, patients condition and plan of care including tests being ordered have been discussed with the patient and husband  who indicate understanding and agree with the plan and Code Status.  Disposition Plan: Home    Consults called: None   Admission status: observation   Time spent: 70 minutes  Jaymon Dudek D.O. [facility] Pager [phone]  If 7PM-7AM, please contact night-coverage [email or web address] Password [facility] 07/21/2017, 4:17 PM

## 2017-07-21 NOTE — ED Triage Notes (Signed)
Per [facility]: Pt to ED from home c/o epigastric/central CP radiating to L shoulder x 3 days. Pt reports chest pressure is worse with and after eating and progresses throughout the day until she eats again. Patient states belching relieves the pain, but this is similar to the pain she had last year (had MI). Pt had eye irritation last week and finished prednisone 3 days ago as well as doxycyline for prophylaxis. She denies N/V/dizziness/ SOB. Hx CHF. EMS VS: 145/60, HR 68 NSR, CBG 200. 20g. L wrist. Pt received 324 ASA PTA with some relief. Resp e/u, skin warm/dry.

## 2017-07-21 NOTE — ED Provider Notes (Signed)
[place] DEPT Provider Note   CSN: [id number] Arrival date & time: 07/21/17  1338     History   Chief Complaint Chief Complaint  Patient presents with  . Chest Pain    HPI Donna Williamson is a 71 y.o. female.  Pt presents to the ED today with cp.  The pt said this is the same pain she had last year when she had her heart attack.  She said the pain radiates to her left shoulder.  Sx started 3 days ago intermittently, but sx now not going away.  She called 911 who told her to take asa which she did.  She did not take any nitro.  She denies any sob.  She does say that she feels full easily.  She also notes that she just finished oral prednisone for an allergic reaction.      Past Medical History:  Diagnosis Date  . Anemia   . Anxiety   . Arthritis    "feet, knees, back" (05/02/2016)  . Asthma   . CAD (coronary artery disease)    a. 04/2016: NSTEMI 95% stenosis 1st Mrg (s/p DES)  . Chest pain    a. 10/2013 Exercise Myoview: Ef 65%, no ischemia.  . Chronic bronchitis ([place])   . Chronic lower back pain   . GERD (gastroesophageal reflux disease)   . History of blood transfusion 1960s   "related to my hysterectomy"  . Hypertension   . Mitral valve prolapse   . NSTEMI (non-ST elevated myocardial infarction) ([place]) 05/01/2016  . Pneumonia 05/01/2016   "saw trace of slight pneumonia/CT scan" (05/02/2016)  . PONV (postoperative nausea and vomiting)   . Seasonal allergies   . Sjogren's syndrome [facility])     Patient Active Problem List   Diagnosis Date Noted  . Coronary artery disease involving native coronary artery of native heart without angina pectoris 05/09/2016  . Stented coronary artery   . NSTEMI (non-ST elevated myocardial infarction) ([place]) 05/02/2016  . Chronic diastolic CHF (congestive heart failure) ([place])   . Hyperglycemia 05/01/2016  . Anemia 05/01/2016  . Anxiety 05/01/2016  . Depression 05/01/2016  . Asthma exacerbation   . Porokeratosis 09/21/2015  . Chest  pain 11/29/2013  . HTN (hypertension) 11/29/2013  . Hyperlipidemia 11/29/2013  . GERD (gastroesophageal reflux disease) 11/29/2013    Past Surgical History:  Procedure Laterality Date  . ABDOMINAL HYSTERECTOMY  1960s   "partial"  . CARDIAC CATHETERIZATION N/A 05/02/2016   Procedure: Right/Left Heart Cath and Coronary Angiography;  Surgeon: Jettie Booze, MD;  Location: [place] CV LAB;  Service: Cardiovascular;  Laterality: N/A;  . CARDIAC CATHETERIZATION N/A 05/02/2016   Procedure: Coronary Stent Intervention;  Surgeon: Jettie Booze, MD;  Location: [place] CV LAB;  Service: Cardiovascular;  Laterality: N/A;  . COLONOSCOPY W/ BIOPSIES AND POLYPECTOMY    . CORONARY ANGIOPLASTY WITH STENT PLACEMENT  05/02/2016   "1 stent"  . ESOPHAGOGASTRODUODENOSCOPY    . JOINT REPLACEMENT    . LAPAROSCOPIC SALPINGOOPHERECTOMY Bilateral ~ 1990  . TOTAL HIP ARTHROPLASTY Left 2007    OB History    No data available       Home Medications    Prior to Admission medications   Medication Sig Start Date End Date Taking? Authorizing Provider  albuterol (PROVENTIL) (2.5 MG/3ML) 0.083% nebulizer solution Take 3 mLs (2.5 mg total) by nebulization every 2 (two) hours as needed for wheezing or shortness of breath. 05/04/16   Hosie Poisson, MD  ALPRAZolam Duanne Moron) 0.5 MG  tablet Take 0.5 mg by mouth daily as needed for anxiety.     [provider]  aspirin EC 81 MG tablet Take 81 mg by mouth daily.    [provider]  Cholecalciferol (VITAMIN D3) 2000 UNITS TABS Take 1 tablet by mouth daily.    [provider]  cyanocobalamin 500 MCG tablet Take 500 mcg by mouth daily.    [provider]  diltiazem (CARDIZEM CD) 240 MG 24 hr capsule Take 240 mg by mouth daily.  11/27/13   [provider]  doxycycline (VIBRAMYCIN) 100 MG capsule Take 1 capsule (100 mg total) by mouth 2 (two) times daily. 07/13/17   Barnet Glasgow, NP  EPIPEN 2-PAK 0.3 MG/0.3ML SOAJ  injection Inject 0.3 mg into the skin daily as needed (allergic reaction).  11/01/13   [provider]  estradiol (ESTRACE) 1 MG tablet Take 0.5 mg by mouth daily.     [provider]  FLUoxetine (PROZAC) 10 MG tablet Take 10 mg by mouth daily.    [provider]  fluticasone (FLONASE) 50 MCG/ACT nasal spray Place 2 sprays into the nose daily.    [provider]  furosemide (LASIX) 20 MG tablet Take 1 tablet (20 mg total) by mouth daily. 06/05/17 09/03/17  Sherren Mocha, MD  gabapentin (NEURONTIN) 100 MG capsule Take 100 mg by mouth daily.     [provider]  isosorbide mononitrate (IMDUR) 30 MG 24 hr tablet Take 15 mg by mouth daily. TAKE HALF TABLET OF 30 MG BY MOUTH (15 MG TOTAL) ONCE DAILY 07/31/16   [provider]  metoprolol tartrate (LOPRESSOR) 25 MG tablet Take 0.5 tablets (12.5 mg total) by mouth 2 (two) times daily. 04/21/17 07/20/17  Sherren Mocha, MD  montelukast (SINGULAIR) 10 MG tablet Take 10 mg by mouth at bedtime.    [provider]  Multiple Vitamin (MULTIVITAMIN WITH MINERALS) TABS tablet Take 1 tablet by mouth daily.    [provider]  mupirocin ointment (BACTROBAN) 2 % Apply 1 application topically daily as needed (skin irritation).  03/29/16   [provider]  nitroGLYCERIN (NITROSTAT) 0.4 MG SL tablet Place 1 tablet (0.4 mg total) under the tongue every 5 (five) minutes as needed for chest pain. 05/06/16   Dorothy Spark, MD  Olopatadine HCl (PATADAY) 0.2 % SOLN Place 1-2 drops into both eyes daily as needed (allergies).    [provider]  pantoprazole (PROTONIX) 40 MG tablet Take 1 tablet (40 mg total) by mouth daily. 08/01/16   Sherren Mocha, MD  predniSONE (STERAPRED UNI-PAK 21 TAB) 10 MG (21) TBPK tablet Take 6 tablets tomorrow, decrease by 1 each day till finished (6,5,4,3,2,1) 07/13/17   Barnet Glasgow, NP  PROAIR HFA 108 (90 BASE) MCG/ACT inhaler Inhale 1-2 puffs into the lungs  every 4 (four) hours as needed for wheezing.  11/01/13   [provider]  rosuvastatin (CRESTOR) 20 MG tablet Take 1 tablet (20 mg total) by mouth daily at 6 PM. 05/30/16   Bhagat, Bhavinkumar, PA  triamcinolone cream (KENALOG) 0.1 % Apply 1 application topically 2 (two) times daily as needed (skin irritation).    [provider]    Family History Family History  Problem Relation Age of Onset  . Hypertension Mother   . Stroke Mother   . Heart disease Father   . Ovarian cancer Sister     Social History Social History  Substance Use Topics  . Smoking status: Never Smoker  . Smokeless tobacco:  Never Used  . Alcohol use No     Allergies   Levaquin [levofloxacin]; Codeine; Macrobid [nitrofurantoin]; Ciprofloxacin; Penicillins; and Sulfa antibiotics   Review of Systems Review of Systems  Cardiovascular: Positive for chest pain.  All other systems reviewed and are negative.    Physical Exam Updated Vital Signs BP (!) 142/55   Pulse (!) 59   Temp 98.2 F (36.8 C) (Oral)   Resp 16   Ht 5\' 5"  (1.651 m)   Wt 98 kg (216 lb)   SpO2 96%   BMI 35.94 kg/m   Physical Exam  Constitutional: She is oriented to person, place, and time. She appears well-developed and well-nourished.  HENT:  Head: Normocephalic and atraumatic.  Right Ear: External ear normal.  Left Ear: External ear normal.  Nose: Nose normal.  Mouth/Throat: Oropharynx is clear and moist.  Eyes: Pupils are equal, round, and reactive to light. Conjunctivae and EOM are normal.  Neck: Normal range of motion. Neck supple.  Cardiovascular: Normal rate, regular rhythm, normal heart sounds and intact distal pulses.   Pulmonary/Chest: Effort normal and breath sounds normal.  Abdominal: Soft. Bowel sounds are normal.  Musculoskeletal: Normal range of motion.  Neurological: She is alert and oriented to person, place, and time.  Skin: Skin is warm. Capillary refill takes less than 2 seconds.  Psychiatric:  She has a normal mood and affect. Her behavior is normal. Judgment and thought content normal.  Nursing note and vitals reviewed.    ED Treatments / Results  Labs (all labs ordered are listed, but only abnormal results are displayed) Labs Reviewed  CBC WITH DIFFERENTIAL/PLATELET - Abnormal; Notable for the following:       Result Value   WBC 16.1 (*)    Hemoglobin 11.4 (*)    HCT 34.9 (*)    Neutro Abs 10.4 (*)    Monocytes Absolute 1.1 (*)    All other components within normal limits  COMPREHENSIVE METABOLIC PANEL - Abnormal; Notable for the following:    BUN 32 (*)    Creatinine, Ser 1.24 (*)    Total Protein 6.3 (*)    Albumin 3.3 (*)    GFR calc non Af Amer 42 (*)    GFR calc Af Amer 49 (*)    All other components within normal limits  I-STAT TROPONIN, ED    EKG  EKG Interpretation  Date/Time:  Friday July 21 2017 13:43:39 EDT Ventricular Rate:  66 PR Interval:    QRS Duration: 100 QT Interval:  404 QTC Calculation: 424 R Axis:   2 Text Interpretation:  Sinus rhythm Probable left ventricular hypertrophy No significant change since last tracing Confirmed by Isla Pence [phone]) on 07/21/2017 3:07:07 PM       Radiology Dg Chest 2 View  Result Date: 07/21/2017 CLINICAL DATA:  Left chest pain radiating to the left shoulder for the past 3 days. EXAM: CHEST  2 VIEW COMPARISON:  05/02/2016. FINDINGS: The cardiac silhouette remains mildly enlarged. Mildly prominent pulmonary vasculature with improvement. Decreased prominence of the interstitial markings. No pleural fluid. Thoracic spine degenerative changes. IMPRESSION: No acute abnormality. Mild cardiomegaly and mild pulmonary vascular congestion. Electronically Signed   By: Claudie Revering M.D.   On: 07/21/2017 15:03    Procedures Procedures (including critical care time)  Medications Ordered in ED Medications  nitroGLYCERIN (NITROGLYN) 2 % ointment 0.5 inch (not administered)     Initial Impression /  Assessment and Plan / ED Course  I have reviewed the triage  vital signs and the nursing notes.  Pertinent labs & imaging results that were available during my care of the patient were reviewed by me and considered in my medical decision making (see chart for details).    Sx may be gi related due to recent prednisone, but she said sx are just like prior mi, so I think she needs a rule out.  I spoke with Dr. Ree Kida who will eval for observation.  Final Clinical Impressions(s) / ED Diagnoses   Final diagnoses:  Chest pain, unspecified type    New Prescriptions New Prescriptions   No medications on file     Isla Pence, MD 07/21/17 1531

## 2017-07-21 NOTE — Telephone Encounter (Signed)
New message   Pt had a heart attack last July. Pt states she has a "full feeling" again like when she had her heart attack last year. She wants a call back. She said the fullness has happened for 3 days.

## 2017-07-21 NOTE — Telephone Encounter (Signed)
I called and spoke with the patient. She states that for the last 3 days she has had a full feeling in her chest, worse after she eats. Symptoms usually subside right before lunch, but she has noticed this all other times of the day. She also reports some intermittent left sided chest pain.  Per her report, symptoms are similar to her those she had with her heart attack last year.  She saw Dr. Burt Knack on 06/02/17 and was instructed to stop plavix at that time, but to continue aspirin which she has done. I advised that patient based on similar symptoms as those related to her previous heart attack, she should report to the ER for further evaluation. The patient is agreeable. She states she is at work now and will complete what she is doing and proceed to the ER. She is aware if she is having active symptoms, she should not drive- she verbalizes she will call 911 if needed.

## 2017-07-22 DIAGNOSIS — I10 Essential (primary) hypertension: Secondary | ICD-10-CM | POA: Diagnosis not present

## 2017-07-22 DIAGNOSIS — R079 Chest pain, unspecified: Secondary | ICD-10-CM | POA: Diagnosis not present

## 2017-07-22 DIAGNOSIS — K219 Gastro-esophageal reflux disease without esophagitis: Secondary | ICD-10-CM | POA: Diagnosis not present

## 2017-07-22 DIAGNOSIS — F329 Major depressive disorder, single episode, unspecified: Secondary | ICD-10-CM | POA: Diagnosis not present

## 2017-07-22 LAB — BASIC METABOLIC PANEL
Anion gap: 9 (ref 5–15)
BUN: 27 mg/dL — AB (ref 6–20)
CO2: 21 mmol/L — ABNORMAL LOW (ref 22–32)
CREATININE: 1.09 mg/dL — AB (ref 0.44–1.00)
Calcium: 8.5 mg/dL — ABNORMAL LOW (ref 8.9–10.3)
Chloride: 105 mmol/L (ref 101–111)
GFR, EST AFRICAN AMERICAN: 57 mL/min — AB (ref >60)
GFR, EST NON AFRICAN AMERICAN: 49 mL/min — AB (ref >60)
Glucose, Bld: 106 mg/dL — ABNORMAL HIGH (ref 65–99)
POTASSIUM: 4.2 mmol/L (ref 3.5–5.1)
SODIUM: 135 mmol/L (ref 135–145)

## 2017-07-22 LAB — TROPONIN I

## 2017-07-22 LAB — MAGNESIUM: MAGNESIUM: 2.1 mg/dL (ref 1.7–2.4)

## 2017-07-22 MED ORDER — RANITIDINE HCL 150 MG PO TABS: 150.0000 mg | ORAL_TABLET | Freq: Two times a day (BID) | ORAL | 1 refills | Status: DC

## 2017-07-22 MED ORDER — SUCRALFATE 1 G PO TABS: 1.0000 g | ORAL_TABLET | Freq: Three times a day (TID) | ORAL | 0 refills | Status: DC | PRN

## 2017-07-22 NOTE — Discharge Instructions (Signed)
Nonspecific Chest Pain °Chest pain can be caused by many different conditions. There is always a chance that your pain could be related to something serious, such as a heart attack or a blood clot in your lungs. Chest pain can also be caused by conditions that are not life-threatening. If you have chest pain, it is very important to follow up with your health care provider. °What are the causes? °Causes of this condition include: °· Heartburn. °· Pneumonia or bronchitis. °· Anxiety or stress. °· Inflammation around your heart (pericarditis) or lung (pleuritis or pleurisy). °· A blood clot in your lung. °· A collapsed lung (pneumothorax). This can develop suddenly on its own (spontaneous pneumothorax) or from trauma to the chest. °· Shingles infection (varicella-zoster virus). °· Heart attack. °· Damage to the bones, muscles, and cartilage that make up your chest wall. This can include: °? Bruised bones due to injury. °? Strained muscles or cartilage due to frequent or repeated coughing or overwork. °? Fracture to one or more ribs. °? Sore cartilage due to inflammation (costochondritis). ° °What increases the risk? °Risk factors for this condition may include: °· Activities that increase your risk for trauma or injury to your chest. °· Respiratory infections or conditions that cause frequent coughing. °· Medical conditions or overeating that can cause heartburn. °· Heart disease or family history of heart disease. °· Conditions or health behaviors that increase your risk of developing a blood clot. °· Having had chicken pox (varicella zoster). ° °What are the signs or symptoms? °Chest pain can feel like: °· Burning or tingling on the surface of your chest or deep in your chest. °· Crushing, pressure, aching, or squeezing pain. °· Dull or sharp pain that is worse when you move, cough, or take a deep breath. °· Pain that is also felt in your back, neck, shoulder, or arm, or pain that spreads to any of these  areas. ° °Your chest pain may come and go, or it may stay constant. °How is this diagnosed? °Lab tests or other studies may be needed to find the cause of your pain. Your health care provider may have you take a test called an ECG (electrocardiogram). An ECG records your heartbeat patterns at the time the test is performed. You may also have other tests, such as: °· Transthoracic echocardiogram (TTE). In this test, sound waves are used to create a picture of the heart structures and to look at how blood flows through your heart. °· Transesophageal echocardiogram (TEE). This is a more advanced imaging test that takes images from inside your body. It allows your health care provider to see your heart in finer detail. °· Cardiac monitoring. This allows your health care provider to monitor your heart rate and rhythm in real time. °· Holter monitor. This is a portable device that records your heartbeat and can help to diagnose abnormal heartbeats. It allows your health care provider to track your heart activity for several days, if needed. °· Stress tests. These can be done through exercise or by taking medicine that makes your heart beat more quickly. °· Blood tests. °· Other imaging tests. ° °How is this treated? °Treatment depends on what is causing your chest pain. Treatment may include: °· Medicines. These may include: °? Acid blockers for heartburn. °? Anti-inflammatory medicine. °? Pain medicine for inflammatory conditions. °? Antibiotic medicine, if an infection is present. °? Medicines to dissolve blood clots. °? Medicines to treat coronary artery disease (CAD). °· Supportive care for conditions that   do not require medicines. This may include: ? Resting. ? Applying heat or cold packs to injured areas. ? Limiting activities until pain decreases.  Follow these instructions at home: Medicines  If you were prescribed an antibiotic, take it as told by your health care provider. Do not stop taking the  antibiotic even if you start to feel better.  Take over-the-counter and prescription medicines only as told by your health care provider. Lifestyle  Do not use any products that contain nicotine or tobacco, such as cigarettes and e-cigarettes. If you need help quitting, ask your health care provider.  Do not drink alcohol.  Make lifestyle changes as directed by your health care provider. These may include: ? Getting regular exercise. Ask your health care provider to suggest some activities that are safe for you. ? Eating a heart-healthy diet. A registered dietitian can help you to learn healthy eating options. ? Maintaining a healthy weight. ? Managing diabetes, if necessary. ? Reducing stress, such as with yoga or relaxation techniques. General instructions  Avoid any activities that bring on chest pain.  If heartburn is the cause for your chest pain, raise (elevate) the head of your bed about 6 inches (15 cm) by putting blocks under the legs. Sleeping with more pillows does not effectively relieve heartburn because it only changes the position of your head.  Keep all follow-up visits as told by your health care provider. This is important. This includes any further testing if your chest pain does not go away. Contact a health care provider if:  Your chest pain does not go away.  You have a rash with blisters on your chest.  You have a fever.  You have chills. Get help right away if:  Your chest pain is worse.  You have a cough that gets worse, or you cough up blood.  You have severe pain in your abdomen.  You have severe weakness.  You faint.  You have sudden, unexplained chest discomfort.  You have sudden, unexplained discomfort in your arms, back, neck, or jaw.  You have shortness of breath at any time.  You suddenly start to sweat, or your skin gets clammy.  You feel nauseous or you vomit.  You suddenly feel light-headed or dizzy.  Your heart begins to beat  quickly, or it feels like it is skipping beats. These symptoms may represent a serious problem that is an emergency. Do not wait to see if the symptoms will go away. Get medical help right away. Call your local emergency services (911 in the [place] Do not drive yourself to the hospital. This information is not intended to replace advice given to you by your health care provider. Make sure you discuss any questions you have with your health care provider. Document Released: 07/27/2005 Document Revised: 07/11/2016 Document Reviewed: 07/11/2016 Elsevier Interactive Patient Education  2017 [employer] for Gastroesophageal Reflux Disease, Adult When you have gastroesophageal reflux disease (GERD), the foods you eat and your eating habits are very important. Choosing the right foods can help ease the discomfort of GERD. Consider working with a diet and nutrition specialist (dietitian) to help you make healthy food choices. What general guidelines should I follow? Eating plan  Choose healthy foods low in fat, such as fruits, vegetables, whole grains, low-fat dairy products, and lean meat, fish, and poultry.  Eat frequent, small meals instead of three large meals each day. Eat your meals slowly, in a relaxed setting. Avoid bending over or lying down  until 2-3 hours after eating.  Limit high-fat foods such as fatty meats or fried foods.  Limit your intake of oils, butter, and shortening to less than 8 teaspoons each day.  Avoid the following: ? Foods that cause symptoms. These may be different for different people. Keep a food diary to keep track of foods that cause symptoms. ? Alcohol. ? Drinking large amounts of liquid with meals. ? Eating meals during the 2-3 hours before bed.  Cook foods using methods other than frying. This may include baking, grilling, or broiling. Lifestyle   Maintain a healthy weight. Ask your health care provider what weight is healthy for you. If you need  to lose weight, work with your health care provider to do so safely.  Exercise for at least 30 minutes on 5 or more days each week, or as told by your health care provider.  Avoid wearing clothes that fit tightly around your waist and chest.  Do not use any products that contain nicotine or tobacco, such as cigarettes and e-cigarettes. If you need help quitting, ask your health care provider.  Sleep with the head of your bed raised. Use a wedge under the mattress or blocks under the bed frame to raise the head of the bed. What foods are not recommended? The items listed may not be a complete list. Talk with your dietitian about what dietary choices are best for you. Grains Pastries or quick breads with added fat. [place] toast. Vegetables Deep fried vegetables. [place] fries. Any vegetables prepared with added fat. Any vegetables that cause symptoms. For some people this may include tomatoes and tomato products, chili peppers, onions and garlic, and horseradish. Fruits Any fruits prepared with added fat. Any fruits that cause symptoms. For some people this may include citrus fruits, such as oranges, grapefruit, pineapple, and lemons. Meats and other protein foods High-fat meats, such as fatty beef or pork, hot dogs, ribs, ham, sausage, salami and bacon. Fried meat or protein, including fried fish and fried chicken. Nuts and nut butters. Dairy Whole milk and chocolate milk. Sour cream. Cream. Ice cream. Cream cheese. Milk shakes. Beverages Coffee and tea, with or without caffeine. Carbonated beverages. Sodas. Energy drinks. Fruit juice made with acidic fruits (such as orange or grapefruit). Tomato juice. Alcoholic drinks. Fats and oils Butter. Margarine. Shortening. Ghee. Sweets and desserts Chocolate and cocoa. Donuts. Seasoning and other foods Pepper. Peppermint and spearmint. Any condiments, herbs, or seasonings that cause symptoms. For some people, this may include curry, hot sauce, or  vinegar-based salad dressings. Summary  When you have gastroesophageal reflux disease (GERD), food and lifestyle choices are very important to help ease the discomfort of GERD.  Eat frequent, small meals instead of three large meals each day. Eat your meals slowly, in a relaxed setting. Avoid bending over or lying down until 2-3 hours after eating.  Limit high-fat foods such as fatty meat or fried foods. This information is not intended to replace advice given to you by your health care provider. Make sure you discuss any questions you have with your health care provider. Document Released: 10/17/2005 Document Revised: 10/18/2016 Document Reviewed: 10/18/2016 Elsevier Interactive Patient Education  2017 [employer].

## 2017-07-22 NOTE — Progress Notes (Signed)
No lab results, md made aware

## 2017-07-22 NOTE — Discharge Summary (Signed)
Physician Discharge Summary  [name] QJJ:[id number] DOB: 24-Jun-1946 DOA: 07/21/2017  PCP: Crist Infante, MD  Admit date: 07/21/2017 Discharge date: 07/22/2017  Time spent: 45 minutes  Recommendations for Outpatient Follow-up:  Patient will be discharged to home.  Patient will need to follow up with primary care provider within one week of discharge. Follow up with cardiology, Dr. Burt Knack in 2 weeks.  Follow up with Dr. Michail Sermon, gastroenterology, in 2-3 weeks if symptoms persist. Patient should continue medications as prescribed.  Patient should follow a heart healthy diet.   Discharge Diagnoses:  Chest pain/tightness Essential hypertension Recent eye infection Neuropathy Asthma GERD with possible gastritis/esophagitis Depression Chronic Diastolic heart failure Chronic kidney disease, stage III Hyperlipidemia  Discharge Condition: Stable  Diet recommendation: Heart healthy  Filed Weights   07/21/17 1344 07/21/17 2015 07/22/17 0632  Weight: 98 kg (216 lb) 99.1 kg (218 lb 6.4 oz) 98.9 kg (218 lb 1.6 oz)    History of present illness:  On 07/21/2017  Donna Williamson is a 71 y.o. female with a medical history of coronary artery disease status post end STEMI July 2017, hypertension, GERD, asthma, who presented to the emergency department with complaints of chest tightness and pain. Patient stated her symptoms of chest tightness started approximately 3 days ago. She was recently placed on prednisone for an eye infection and completed her course of prednisone on the same day. Patient states that she normally has a full feeling at which point she starts having chest tightness, which occurs after eating a large meal, particularly dinner. Pain lasts a few hours and resolves on its own. Patient does have nitro at home, however, has not used any. Patient states she had chest tightness which was reminiscent of her heart attack one year ago. She denied any shortness of breath, nausea,  dizziness, diaphoresis, jaw pain, radiation of symptoms. Patient currently feels that her tightness has improved mildly. Denies current shortness of breath, abdominal pain, nausea or vomiting, diarrhea or constipation, recent travel or ill contacts.  Hospital Course:  Chest pain/tightness- ACS ruled out -EKG shows no changes, troponin unremarkable -troponins cycled and unremarkable -Suspect likely GI related pain as patient recently completed prednisone taper from eye infection and complaints of "fullness/tightness" occurred after eating large meals,particularly dinner. -No further reports of chest pain/tightness -GI cocktail did help patient's symptoms -last echocardiogram 05/06/2016 EF 55-60%  -patient follows with Dr. Burt Knack, follow up in 2 weeks -continue aspirin, Imdur, statin, cardizem, metoprolol  Essential hypertension -Continue metoprolol, diltiazem, lasix, imdur  Recent eye infection -Patient recently completed course of prednisone -Continue doxycycline -Continue eyedrops  Neuropathy -Continue Neurontin  Asthma -Controlled, continue albuterol as needed  GERD with possible gastritis/esophagitis -Continue Protonix  -GI cocktail was used overnight and did alleviate patient's symptoms of abdominal/chest "fullness" -Advised patient to use zantac in conjunction with PPI -Follow up with GI if symptoms continue to persist in a couple of weeks. Patient sees Dr. Michail Sermon. -Suspect patient may have had esophagitis/gastritis from recent prednisone use. -Advised patient to avoid spicy, fatty, saucy foods that may exacerbate her GERD.  Depression -Continue Prozac  Chronic Diastolic heart failure -Last echocardiogram 2017 unable to evaluate diastolic function -On Lasix -Currently appears to be euvolemic and compensated -Monitor intake and output, daily weights  Chronic kidney disease, stage III -Creatinine appears to be at baseline, currently  1.09  Hyperlipidemia -Continue statin  Procedures: None  Consultations: None  Discharge Exam: Vitals:   07/22/17 0157 07/22/17 0632  BP: (!) 135/54 137/64  Pulse: 70 64  Resp: 17   Temp: 98.6 F (37 C) 99 F (37.2 C)  SpO2: 93% 94%   Denies further chest pain/fullness/tightness. Experienced it last night after eating dinner, however symptoms resolved with GI cocktail. Denies shortness of breath, abdominal pain, nausea, vomiting, diarrhea, constipation, dizziness, headache.    General: Well developed, well nourished, NAD, appears stated age  HEENT: NCAT, mucous membranes moist.  Cardiovascular: S1 S2 auscultated, no rubs, murmurs or gallops. Regular rate and rhythm.  Respiratory: Clear to auscultation bilaterally with equal chest rise  Abdomen: Soft, nontender, nondistended, + bowel sounds  Extremities: warm dry without cyanosis clubbing or edema  Neuro: AAOx3, nonfocal  Skin: Without rashes exudates or nodules  Psych: Pleasant, Normal affect and demeanor with intact judgement and insight  Discharge Instructions Discharge Instructions    Discharge instructions    Complete by:  As directed    Patient will be discharged to home.  Patient will need to follow up with primary care provider within one week of discharge. Follow up with cardiology, Dr. Burt Knack in 2 weeks.  Follow up with Dr. Michail Sermon, gastroenterology, in 2-3 weeks if symptoms persist. Patient should continue medications as prescribed.  Patient should follow a heart healthy diet.   Avoid fatty, spicy, saucy foods.     Current Discharge Medication List    START taking these medications   Details  ranitidine (ZANTAC) 150 MG tablet Take 1 tablet (150 mg total) by mouth 2 (two) times daily. Qty: 60 tablet, Refills: 1    sucralfate (CARAFATE) 1 g tablet Take 1 tablet (1 g total) by mouth 3 (three) times daily with meals as needed. Qty: 60 tablet, Refills: 0      CONTINUE these medications which have  NOT CHANGED   Details  albuterol (PROVENTIL) (2.5 MG/3ML) 0.083% nebulizer solution Take 3 mLs (2.5 mg total) by nebulization every 2 (two) hours as needed for wheezing or shortness of breath. Qty: 75 mL, Refills: 2    ALPRAZolam (XANAX) 0.5 MG tablet Take 0.5 mg by mouth daily as needed for anxiety.     aspirin EC 81 MG tablet Take 81 mg by mouth daily.    Cholecalciferol (VITAMIN D3) 2000 UNITS TABS Take 1 tablet by mouth daily.    cyanocobalamin 500 MCG tablet Take 500 mcg by mouth daily.    diltiazem (CARDIZEM CD) 240 MG 24 hr capsule Take 240 mg by mouth daily.     doxycycline (VIBRAMYCIN) 100 MG capsule Take 1 capsule (100 mg total) by mouth 2 (two) times daily. Qty: 20 capsule, Refills: 0    EPIPEN 2-PAK 0.3 MG/0.3ML SOAJ injection Inject 0.3 mg into the skin daily as needed (allergic reaction).     estradiol (ESTRACE) 1 MG tablet Take 0.5 mg by mouth daily.     FLUoxetine (PROZAC) 10 MG tablet Take 10 mg by mouth daily.    fluticasone (FLONASE) 50 MCG/ACT nasal spray Place 2 sprays into the nose daily.    furosemide (LASIX) 20 MG tablet Take 1 tablet (20 mg total) by mouth daily. Qty: 90 tablet, Refills: 3    gabapentin (NEURONTIN) 100 MG capsule Take 100 mg by mouth daily.     Glycerin-Polysorbate 80 (REFRESH DRY EYE THERAPY OP) Place 2 drops into both eyes daily. Advance formulia    isosorbide mononitrate (IMDUR) 30 MG 24 hr tablet Take 15 mg by mouth daily. TAKE HALF TABLET OF 30 MG BY MOUTH (15 MG TOTAL) ONCE DAILY Refills: 1  metoprolol tartrate (LOPRESSOR) 25 MG tablet Take 0.5 tablets (12.5 mg total) by mouth 2 (two) times daily. Qty: 180 tablet, Refills: 3    montelukast (SINGULAIR) 10 MG tablet Take 10 mg by mouth at bedtime.    Multiple Vitamin (MULTIVITAMIN WITH MINERALS) TABS tablet Take 1 tablet by mouth daily.    mupirocin ointment (BACTROBAN) 2 % Apply 1 application topically daily as needed (skin irritation).  Refills: 0    nitroGLYCERIN  (NITROSTAT) 0.4 MG SL tablet Place 1 tablet (0.4 mg total) under the tongue every 5 (five) minutes as needed for chest pain. Qty: 25 tablet, Refills: prn    pantoprazole (PROTONIX) 40 MG tablet Take 1 tablet (40 mg total) by mouth daily. Qty: 90 tablet, Refills: 3    PROAIR HFA 108 (90 BASE) MCG/ACT inhaler Inhale 1-2 puffs into the lungs every 4 (four) hours as needed for wheezing.     rosuvastatin (CRESTOR) 20 MG tablet Take 1 tablet (20 mg total) by mouth daily at 6 PM. Qty: 30 tablet, Refills: 0    triamcinolone cream (KENALOG) 0.1 % Apply 1 application topically 2 (two) times daily as needed (skin irritation).    predniSONE (STERAPRED UNI-PAK 21 TAB) 10 MG (21) TBPK tablet TK PO UTD ON PACKAGE Refills: 0       Allergies  Allergen Reactions  . Levaquin [Levofloxacin] Anaphylaxis  . Codeine Nausea Only  . Macrobid [Nitrofurantoin] Other (See Comments)    unknown  . Ciprofloxacin Rash  . Penicillins Rash    Has patient had a PCN reaction causing immediate rash, facial/tongue/throat swelling, SOB or lightheadedness with hypotension: No Has patient had a PCN reaction causing severe rash involving mucus membranes or skin necrosis: No Has patient had a PCN reaction that required hospitalization: No Has patient had a PCN reaction occurring within the last 10 years: No If all of the above answers are "NO", then may proceed with Cephalosporin use.  Ignacia Bayley Antibiotics Rash   Follow-up Information    Crist Infante, MD. Schedule an appointment as soon as possible for a visit in 1 week(s).   Specialty:  Internal Medicine Why:  Hospital follow up Contact information: [place] [address] [phone]        Wilford Corner, MD. Call.   Specialty:  Gastroenterology Why:  As needed for Acid Reflux Contact information: [address] [place] [address] [phone]        Sherren Mocha, MD. Schedule an appointment as soon as possible for  a visit in 2 week(s).   Specialty:  Cardiology Why:  Follow up Contact information: [address] [place] [address] [phone]            The results of significant diagnostics from this hospitalization (including imaging, microbiology, ancillary and laboratory) are listed below for reference.    Significant Diagnostic Studies: Dg Chest 2 View  Result Date: 07/21/2017 CLINICAL DATA:  Left chest pain radiating to the left shoulder for the past 3 days. EXAM: CHEST  2 VIEW COMPARISON:  05/02/2016. FINDINGS: The cardiac silhouette remains mildly enlarged. Mildly prominent pulmonary vasculature with improvement. Decreased prominence of the interstitial markings. No pleural fluid. Thoracic spine degenerative changes. IMPRESSION: No acute abnormality. Mild cardiomegaly and mild pulmonary vascular congestion. Electronically Signed   By: Claudie Revering M.D.   On: 07/21/2017 15:03    Microbiology: No results found for this or any previous visit (from the past 240 hour(s)).   Labs: Basic  Metabolic Panel:  Recent Labs Lab 07/21/17 1347 07/21/17 2050 07/22/17 0815  NA 138  --  135  K 4.1  --  4.2  CL 106  --  105  CO2 24  --  21*  GLUCOSE 92  --  106*  BUN 32*  --  27*  CREATININE 1.24* 1.28* 1.09*  CALCIUM 8.9  --  8.5*  MG  --   --  2.1   Liver Function Tests:  Recent Labs Lab 07/21/17 1347  AST 18  ALT 15  ALKPHOS 86  BILITOT 0.4  PROT 6.3*  ALBUMIN 3.3*   No results for input(s): LIPASE, AMYLASE in the last 168 hours. No results for input(s): AMMONIA in the last 168 hours. CBC:  Recent Labs Lab 07/21/17 1347 07/21/17 2050  WBC 16.1* 16.0*  NEUTROABS 10.4*  --   HGB 11.4* 12.2  HCT 34.9* 37.7  MCV 90.2 90.4  PLT 364 385   Cardiac Enzymes:  Recent Labs Lab 07/21/17 2050 07/22/17 0213 07/22/17 0815  TROPONINI <0.03 <0.03 <0.03   BNP: BNP (last 3 results) No results for input(s): BNP in the last 8760 hours.  ProBNP (last 3  results) No results for input(s): PROBNP in the last 8760 hours.  CBG: No results for input(s): GLUCAP in the last 168 hours.     SignedCristal Ford  [facility] 07/22/2017, 11:33 AM

## 2017-07-22 NOTE — Care Management Obs Status (Signed)
[facility] NOTIFICATION   Patient Details  Name: Donna Williamson MRN: [id number] Date of Birth: 06-27-1946   Medicare Observation Status Notification Given:  Yes    Zenon Mayo, RN 07/22/2017, 12:13 PM

## 2017-07-24 ENCOUNTER — Encounter (HOSPITAL_COMMUNITY): Payer: Medicare Other

## 2017-07-24 ENCOUNTER — Other Ambulatory Visit: Payer: Self-pay | Admitting: Cardiovascular Disease

## 2017-07-26 ENCOUNTER — Encounter (HOSPITAL_COMMUNITY): Payer: Medicare Other

## 2017-07-28 ENCOUNTER — Encounter (HOSPITAL_COMMUNITY): Payer: Medicare Other

## 2017-07-31 ENCOUNTER — Encounter (HOSPITAL_COMMUNITY): Payer: Medicare Other

## 2017-07-31 ENCOUNTER — Other Ambulatory Visit: Payer: Self-pay | Admitting: Internal Medicine

## 2017-07-31 DIAGNOSIS — M542 Cervicalgia: Secondary | ICD-10-CM

## 2017-07-31 DIAGNOSIS — G44309 Post-traumatic headache, unspecified, not intractable: Secondary | ICD-10-CM

## 2017-08-01 ENCOUNTER — Ambulatory Visit
Admission: RE | Admit: 2017-08-01 | Discharge: 2017-08-01 | Disposition: A | Discharge status: Home / Self Care | Payer: Medicare Other | Source: Ambulatory Visit | Attending: Internal Medicine | Admitting: Internal Medicine

## 2017-08-01 DIAGNOSIS — G44309 Post-traumatic headache, unspecified, not intractable: Secondary | ICD-10-CM

## 2017-08-01 DIAGNOSIS — M542 Cervicalgia: Secondary | ICD-10-CM

## 2017-08-02 ENCOUNTER — Encounter (HOSPITAL_COMMUNITY): Payer: Medicare Other

## 2017-08-04 ENCOUNTER — Encounter (HOSPITAL_COMMUNITY): Payer: Medicare Other

## 2017-08-07 ENCOUNTER — Encounter (HOSPITAL_COMMUNITY): Payer: Medicare Other

## 2017-08-09 ENCOUNTER — Other Ambulatory Visit: Payer: Self-pay | Admitting: Internal Medicine

## 2017-08-09 ENCOUNTER — Encounter (HOSPITAL_COMMUNITY): Payer: Medicare Other

## 2017-08-09 DIAGNOSIS — M542 Cervicalgia: Secondary | ICD-10-CM

## 2017-08-10 NOTE — Progress Notes (Signed)
Cardiology Office Note    Date:  08/14/2017   ID:  Donna, Williamson 01-24-1946, MRN [id number]  PCP:  Crist Infante, MD  Cardiologist:  Dr. Burt Knack  Chief Complaint: Hospital follow up for chest pain   History of Present Illness:   Donna Williamson is a 71 y.o. female with a hx of CAD s/p DES to OM1, diastolic CHF, HTN, Sjogren's syndrome and GERD presented for hospital follow up.   She was admitted in 7/17 with acute diastolic CHF in the setting of non-STEMI. LHC demonstrated moderate non-obsCAD involving the LAD and severe stenosis in the OM1 branch of the LCx. She was treated with PCI of the OM1 with a Synergy DES. She was noted to have moderate pulmonary HTN and elevated LVEDP at the time of her cardiac catheterization, consistent with diastolic CHF. EF was normal by echocardiogram.  Her antiplatelet regimen was changed from Brilinta to Plavix due to side effects with epistaxis.   She was doing well on cardiac stand point when last seen by Dr. Burt Knack August 2018.  Discontinue Plavix as she was out beyond 12 months from her non-sTEMI and PCI.  Patient was admitted 07/2017 for chest pain x 3 days.  Her pain occurred after eating.  However, pain similar to prior  non-STEMI. Patient was ruled out.  No acute change in EKG.  troponin unremarkable. GI cocktail helped to relieve symptoms. Suspected likely GI related pain as patient recently completed prednisone taper from eye infection and complaints of "fullness/tightness" occurred after eating large meals, particularly dinner. Advised to use Zantac with PPI.   Here today follow-up.  Patient did not filed up PPI or Zantac. However, she took OTC Pepcid for few days. No recurrent symptoms. She denies chest pain, shortness of breath, palpitations, dizziness,othpnea, PND, syncope or lower extremity edema or melena.  She is compliant with low-sodium diet.  Past Medical History:  Diagnosis Date  . Anemia   . Anxiety   . Arthritis    "feet,  knees, back" (05/02/2016)  . Asthma   . CAD (coronary artery disease)    a. 04/2016: NSTEMI 95% stenosis 1st Mrg (s/p DES)  . Chest pain    a. 10/2013 Exercise Myoview: Ef 65%, no ischemia.  . Chronic bronchitis ([place])   . Chronic lower back pain   . GERD (gastroesophageal reflux disease)   . History of blood transfusion 1960s   "related to my hysterectomy"  . Hypertension   . Mitral valve prolapse   . NSTEMI (non-ST elevated myocardial infarction) ([place]) 05/01/2016  . Pneumonia 05/01/2016   "saw trace of slight pneumonia/CT scan" (05/02/2016)  . PONV (postoperative nausea and vomiting)   . Seasonal allergies   . Sjogren's syndrome [facility])     Past Surgical History:  Procedure Laterality Date  . ABDOMINAL HYSTERECTOMY  1960s   "partial"  . CARDIAC CATHETERIZATION N/A 05/02/2016   Procedure: Right/Left Heart Cath and Coronary Angiography;  Surgeon: Jettie Booze, MD;  Location: [facility];  Service: Cardiovascular;  Laterality: N/A;  . CARDIAC CATHETERIZATION N/A 05/02/2016   Procedure: Coronary Stent Intervention;  Surgeon: Jettie Booze, MD;  Location: [facility];  Service: Cardiovascular;  Laterality: N/A;  . COLONOSCOPY W/ BIOPSIES AND POLYPECTOMY    . CORONARY ANGIOPLASTY WITH STENT PLACEMENT  05/02/2016   "1 stent"  . ESOPHAGOGASTRODUODENOSCOPY    . JOINT REPLACEMENT    . LAPAROSCOPIC SALPINGOOPHERECTOMY Bilateral ~ 1990  . TOTAL HIP ARTHROPLASTY Left 2007  Current Medications: Prior to Admission medications   Medication Sig Start Date End Date Taking? Authorizing Provider  albuterol (PROVENTIL) (2.5 MG/3ML) 0.083% nebulizer solution Take 3 mLs (2.5 mg total) by nebulization every 2 (two) hours as needed for wheezing or shortness of breath. 05/04/16   Hosie Poisson, MD  ALPRAZolam Duanne Moron) 0.5 MG tablet Take 0.5 mg by mouth daily as needed for anxiety.     [provider]  aspirin EC 81 MG tablet Take 81 mg by mouth daily.    [provider]    Cholecalciferol (VITAMIN D3) 2000 UNITS TABS Take 1 tablet by mouth daily.    [provider]  cyanocobalamin 500 MCG tablet Take 500 mcg by mouth daily.    [provider]  diltiazem (CARDIZEM CD) 240 MG 24 hr capsule Take 240 mg by mouth daily.  11/27/13   [provider]  doxycycline (VIBRAMYCIN) 100 MG capsule Take 1 capsule (100 mg total) by mouth 2 (two) times daily. 07/13/17   Barnet Glasgow, NP  EPIPEN 2-PAK 0.3 MG/0.3ML SOAJ injection Inject 0.3 mg into the skin daily as needed (allergic reaction).  11/01/13   [provider]  estradiol (ESTRACE) 1 MG tablet Take 0.5 mg by mouth daily.     [provider]  FLUoxetine (PROZAC) 10 MG tablet Take 10 mg by mouth daily.    [provider]  fluticasone (FLONASE) 50 MCG/ACT nasal spray Place 2 sprays into the nose daily.    [provider]  furosemide (LASIX) 20 MG tablet Take 1 tablet (20 mg total) by mouth daily. 06/05/17 09/03/17  Sherren Mocha, MD  gabapentin (NEURONTIN) 100 MG capsule Take 100 mg by mouth daily.     [provider]  Glycerin-Polysorbate 80 (REFRESH DRY EYE THERAPY OP) Place 2 drops into both eyes daily. Advance formulia    [provider]  isosorbide mononitrate (IMDUR) 30 MG 24 hr tablet Take 15 mg by mouth daily. TAKE HALF TABLET OF 30 MG BY MOUTH (15 MG TOTAL) ONCE DAILY 07/31/16   [provider]  metoprolol tartrate (LOPRESSOR) 25 MG tablet Take 0.5 tablets (12.5 mg total) by mouth 2 (two) times daily. 04/21/17 07/21/17  Sherren Mocha, MD  montelukast (SINGULAIR) 10 MG tablet Take 10 mg by mouth at bedtime.    [provider]  Multiple Vitamin (MULTIVITAMIN WITH MINERALS) TABS tablet Take 1 tablet by mouth daily.    [provider]  mupirocin ointment (BACTROBAN) 2 % Apply 1 application topically daily as needed (skin irritation).  03/29/16   [provider]  nitroGLYCERIN (NITROSTAT) 0.4 MG SL tablet Place 1  tablet (0.4 mg total) under the tongue every 5 (five) minutes as needed for chest pain. 05/06/16   Dorothy Spark, MD  pantoprazole (PROTONIX) 40 MG tablet TAKE 1 TABLET BY MOUTH ONCE DAILY 07/24/17   Sherren Mocha, MD  predniSONE (STERAPRED UNI-PAK 21 TAB) 10 MG (21) TBPK tablet TK PO UTD ON PACKAGE 07/13/17   [provider]  PROAIR HFA 108 (90 BASE) MCG/ACT inhaler Inhale 1-2 puffs into the lungs every 4 (four) hours as needed for wheezing.  11/01/13   [provider]  ranitidine (ZANTAC) 150 MG tablet Take 1 tablet (150 mg total) by mouth 2 (two) times daily. 07/22/17 07/22/18  Cristal Ford, DO  rosuvastatin (CRESTOR) 20 MG tablet Take 1 tablet (20 mg total) by mouth daily at 6 PM. 05/30/16   Nancey Kreitz, PA  sucralfate (CARAFATE) 1 g tablet Take 1  tablet (1 g total) by mouth 3 (three) times daily with meals as needed. 07/22/17   Mikhail, Velta Addison, DO  triamcinolone cream (KENALOG) 0.1 % Apply 1 application topically 2 (two) times daily as needed (skin irritation).    [provider]    Allergies:   Levaquin [levofloxacin]; Codeine; Macrobid [nitrofurantoin]; Ciprofloxacin; Penicillins; and Sulfa antibiotics   Social History   Social History  . Marital status: Married    Spouse name: N/A  . Number of children: N/A  . Years of education: N/A   Social History Main Topics  . Smoking status: Never Smoker  . Smokeless tobacco: Never Used  . Alcohol use No  . Drug use: No  . Sexual activity: Yes   Other Topics Concern  . None   Social History Narrative   Lives in [place] with husband.  Active but does not routinely exercise.     Family History:  The patient's family history includes Heart disease in her father; Hypertension in her mother; Ovarian cancer in her sister; Stroke in her mother.   ROS:   Please see the history of present illness.    ROS All other systems reviewed and are negative.   PHYSICAL EXAM:   VS:  BP 120/72   Pulse 65   Ht 5'  6" (1.676 m)   Wt 218 lb 1.9 oz (98.9 kg)   BMI 35.21 kg/m    GEN: Well nourished, well developed, in no acute distress  HEENT: normal  Neck: no JVD, carotid bruits, or masses Cardiac: RRR; no murmurs, rubs, or gallops,no edema  Respiratory:  clear to auscultation bilaterally, normal work of breathing GI: soft, nontender, nondistended, + BS MS: no deformity or atrophy  Skin: warm and dry, no rash Neuro:  Alert and Oriented x 3, Strength and sensation are intact Psych: euthymic mood, full affect  Wt Readings from Last 3 Encounters:  08/14/17 218 lb 1.9 oz (98.9 kg)  07/22/17 218 lb 1.6 oz (98.9 kg)  06/02/17 220 lb 6.4 oz (100 kg)      Studies/Labs Reviewed:   EKG:  EKG is ordered today.  The ekg ordered today demonstrates  Normal sinus rhythm at rate of 65 bpm  Recent Labs: 07/21/2017: ALT 15; Hemoglobin 12.2; Platelets 385 07/22/2017: BUN 27; Creatinine, Ser 1.09; Magnesium 2.1; Potassium 4.2; Sodium 135   Lipid Panel    Component Value Date/Time   CHOL 132 08/09/2016 0738   TRIG 201 (H) 08/09/2016 0738   HDL 42 (L) 08/09/2016 0738   CHOLHDL 3.1 08/09/2016 0738   VLDL 40 (H) 08/09/2016 0738   LDLCALC 50 08/09/2016 0738    Additional studies/ records that were reviewed today include:   Echocardiogram: 05/01/16 Study Conclusions  - Left ventricle: The cavity size was normal. Wall thickness was   normal. Systolic function was normal. The estimated ejection   fraction was in the range of 55% to 60%. Wall motion was normal;   there were no regional wall motion abnormalities. The study is   not technically sufficient to allow evaluation of LV diastolic   function. - Mitral valve: There was mild regurgitation.  Impressions:  - Definity used; Normal LV systolic function; mild MR.  Coronary Stent Intervention  05/02/16  Right/Left Heart Cath and Coronary Angiography  Conclusion    Prox LAD to Mid LAD lesion, 30% stenosed.  Mid LAD lesion, 50% stenosed at  bifurcation of Ost 2nd Diag to 2nd Diag lesion, 70% stenosed.  1st Mrg-1 lesion, 95% stenosed. Post  intervention with a 2.5 x 16 Synergy drug eluting stent post dilated to 2.8 mm, there is a 0% residual stenosis.  Moderate pulmonary HTN.  Elevated LVEDP. CO 8.7 L/min. CI 3.96.   Continue dual antiplatelet therapy ideally for a year. She does have anemia but has had bleeding source is ruled out. Brilinta started. She would be a candidate for TWILIGHT study.  She will need some diuresis over the next few days.    ASSESSMENT & PLAN:    1. Recent chest pain  -  Felt atypical with likely GI etiology. No recurrent symptoms. Relate to spicy foods.   2. CAD s/p DES to OM1 -  No angina. Continue aspirin, statin beta blocker and Imdur.  3. Chronic diastolic CHF -  Euvolemic. Continue current regimen.  4. HTN -  Stable and well controlled on current therapy.  5. HLD LDL 76 on 06/20/17. Continue statin.   6. Pre -op clearance -  She had a pinched nerve on her neck. She is schedule for epidural ing later this month by [facility] orthopedic. She is cleared. Hold ASA by recommended by surgery.    - Medication Adjustments/Labs and Tests Ordered: Current medicines are reviewed at length with the patient today.  Concerns regarding medicines are outlined above.  Medication changes, Labs and Tests ordered today are listed in the Patient Instructions below. Patient Instructions  Medication Instructions: Your physician recommends that you continue on your current medications as directed. Please refer to the Current Medication list given to you today.  Labwork: None Ordered  Procedures/Testing: None Ordered  Follow-Up: Your physician wants you to follow-up in: 1 YEAR with Dr. Burt Knack.  You will receive a reminder letter in the mail two months in advance. If you don't receive a letter, please call our office to schedule the follow-up appointment.   If you need a refill on your cardiac  medications before your next appointment, please call your pharmacy.     Jarrett Soho, [place]  08/14/2017 10:25 AM    [facility] [address], [place], [place]  [address] Phone: [phone]; Fax: [phone]

## 2017-08-11 ENCOUNTER — Encounter (HOSPITAL_COMMUNITY): Payer: Medicare Other

## 2017-08-14 ENCOUNTER — Ambulatory Visit (INDEPENDENT_AMBULATORY_CARE_PROVIDER_SITE_OTHER): Payer: Medicare Other | Admitting: Physician Assistant

## 2017-08-14 ENCOUNTER — Encounter (HOSPITAL_COMMUNITY): Payer: Medicare Other

## 2017-08-14 ENCOUNTER — Encounter: Payer: Self-pay | Admitting: Physician Assistant

## 2017-08-14 VITALS — BP 120/72 | HR 65 | Ht 66.0 in | Wt 218.1 lb

## 2017-08-14 DIAGNOSIS — Z01818 Encounter for other preprocedural examination: Secondary | ICD-10-CM

## 2017-08-14 DIAGNOSIS — I10 Essential (primary) hypertension: Secondary | ICD-10-CM

## 2017-08-14 DIAGNOSIS — I5032 Chronic diastolic (congestive) heart failure: Secondary | ICD-10-CM | POA: Diagnosis not present

## 2017-08-14 DIAGNOSIS — I25119 Atherosclerotic heart disease of native coronary artery with unspecified angina pectoris: Secondary | ICD-10-CM

## 2017-08-14 DIAGNOSIS — E785 Hyperlipidemia, unspecified: Secondary | ICD-10-CM

## 2017-08-14 DIAGNOSIS — I2510 Atherosclerotic heart disease of native coronary artery without angina pectoris: Secondary | ICD-10-CM | POA: Diagnosis not present

## 2017-08-14 NOTE — Patient Instructions (Signed)
Medication Instructions: Your physician recommends that you continue on your current medications as directed. Please refer to the Current Medication list given to you today.  Labwork: None Ordered  Procedures/Testing: None Ordered  Follow-Up: Your physician wants you to follow-up in: 1 YEAR with Dr. Burt Knack.  You will receive a reminder letter in the mail two months in advance. If you don't receive a letter, please call our office to schedule the follow-up appointment.   If you need a refill on your cardiac medications before your next appointment, please call your pharmacy.

## 2017-08-16 ENCOUNTER — Encounter (HOSPITAL_COMMUNITY): Payer: Medicare Other

## 2017-08-18 ENCOUNTER — Encounter (HOSPITAL_COMMUNITY): Payer: Medicare Other

## 2017-08-21 ENCOUNTER — Encounter (HOSPITAL_COMMUNITY): Payer: Medicare Other

## 2017-08-23 ENCOUNTER — Encounter (HOSPITAL_COMMUNITY): Payer: Medicare Other

## 2017-08-23 ENCOUNTER — Ambulatory Visit
Admission: RE | Admit: 2017-08-23 | Discharge: 2017-08-23 | Disposition: A | Discharge status: Home / Self Care | Payer: Medicare Other | Source: Ambulatory Visit | Attending: Internal Medicine | Admitting: Internal Medicine

## 2017-08-23 DIAGNOSIS — M542 Cervicalgia: Secondary | ICD-10-CM

## 2017-08-23 MED ORDER — IOPAMIDOL (ISOVUE-M 300) INJECTION 61%
1.0000 mL | Freq: Once | INTRAMUSCULAR | Status: AC | PRN
  Administered 2017-08-23: 1 mL via EPIDURAL

## 2017-08-23 MED ORDER — TRIAMCINOLONE ACETONIDE 40 MG/ML IJ SUSP (RADIOLOGY)
60.0000 mg | Freq: Once | INTRAMUSCULAR | Status: AC
  Administered 2017-08-23: 60 mg via EPIDURAL

## 2017-08-23 NOTE — Discharge Instructions (Signed)

## 2017-08-25 ENCOUNTER — Encounter (HOSPITAL_COMMUNITY): Payer: Medicare Other

## 2017-08-28 ENCOUNTER — Encounter (HOSPITAL_COMMUNITY): Payer: Medicare Other

## 2017-08-30 ENCOUNTER — Encounter (HOSPITAL_COMMUNITY): Payer: Medicare Other

## 2017-09-01 ENCOUNTER — Encounter (HOSPITAL_COMMUNITY): Payer: Medicare Other

## 2017-09-04 ENCOUNTER — Encounter (HOSPITAL_COMMUNITY): Payer: Medicare Other

## 2017-09-06 ENCOUNTER — Encounter (HOSPITAL_COMMUNITY): Payer: Medicare Other

## 2017-09-08 ENCOUNTER — Encounter (HOSPITAL_COMMUNITY): Payer: Medicare Other

## 2017-09-11 ENCOUNTER — Encounter (HOSPITAL_COMMUNITY): Payer: Medicare Other

## 2017-09-13 ENCOUNTER — Encounter (HOSPITAL_COMMUNITY): Payer: Medicare Other

## 2017-09-15 ENCOUNTER — Encounter (HOSPITAL_COMMUNITY): Payer: Medicare Other

## 2017-09-18 ENCOUNTER — Encounter (HOSPITAL_COMMUNITY): Payer: Medicare Other

## 2017-09-20 ENCOUNTER — Encounter (HOSPITAL_COMMUNITY): Payer: Medicare Other

## 2017-09-25 ENCOUNTER — Encounter (HOSPITAL_COMMUNITY): Payer: Medicare Other

## 2017-09-27 ENCOUNTER — Encounter (HOSPITAL_COMMUNITY): Payer: Medicare Other

## 2017-09-29 ENCOUNTER — Encounter (HOSPITAL_COMMUNITY): Payer: Medicare Other

## 2017-10-02 ENCOUNTER — Encounter (HOSPITAL_COMMUNITY): Payer: Medicare Other

## 2017-10-04 ENCOUNTER — Encounter (HOSPITAL_COMMUNITY): Payer: Medicare Other

## 2017-10-06 ENCOUNTER — Encounter (HOSPITAL_COMMUNITY): Payer: Medicare Other

## 2017-10-09 ENCOUNTER — Encounter (HOSPITAL_COMMUNITY): Payer: Medicare Other

## 2017-10-11 ENCOUNTER — Encounter (HOSPITAL_COMMUNITY): Payer: Medicare Other

## 2017-10-13 ENCOUNTER — Encounter (HOSPITAL_COMMUNITY): Payer: Medicare Other

## 2017-10-16 ENCOUNTER — Encounter (HOSPITAL_COMMUNITY): Payer: Medicare Other

## 2017-10-18 ENCOUNTER — Encounter (HOSPITAL_COMMUNITY): Payer: Medicare Other

## 2017-10-20 ENCOUNTER — Encounter (HOSPITAL_COMMUNITY): Payer: Medicare Other

## 2017-10-25 ENCOUNTER — Encounter (HOSPITAL_COMMUNITY): Payer: Medicare Other

## 2017-10-27 ENCOUNTER — Encounter (HOSPITAL_COMMUNITY): Payer: Medicare Other

## 2017-10-30 ENCOUNTER — Encounter (HOSPITAL_COMMUNITY): Payer: Medicare Other

## 2017-11-01 ENCOUNTER — Encounter (HOSPITAL_COMMUNITY): Payer: Medicare Other

## 2017-11-03 ENCOUNTER — Encounter (HOSPITAL_COMMUNITY): Payer: Medicare Other

## 2017-11-06 ENCOUNTER — Encounter (HOSPITAL_COMMUNITY): Payer: Medicare Other

## 2017-11-08 ENCOUNTER — Encounter (HOSPITAL_COMMUNITY): Payer: Medicare Other

## 2017-11-10 ENCOUNTER — Encounter (HOSPITAL_COMMUNITY): Payer: Medicare Other

## 2017-11-13 ENCOUNTER — Encounter (HOSPITAL_COMMUNITY): Payer: Medicare Other

## 2017-11-15 ENCOUNTER — Encounter (HOSPITAL_COMMUNITY): Payer: Medicare Other

## 2017-11-17 ENCOUNTER — Encounter (HOSPITAL_COMMUNITY): Payer: Medicare Other

## 2017-11-20 ENCOUNTER — Encounter (HOSPITAL_COMMUNITY): Payer: Medicare Other

## 2017-11-22 ENCOUNTER — Encounter (HOSPITAL_COMMUNITY): Payer: Medicare Other

## 2017-11-24 ENCOUNTER — Encounter (HOSPITAL_COMMUNITY): Payer: Medicare Other

## 2017-11-27 ENCOUNTER — Encounter (HOSPITAL_COMMUNITY): Payer: Medicare Other

## 2017-11-29 ENCOUNTER — Encounter (HOSPITAL_COMMUNITY): Payer: Medicare Other

## 2017-12-01 ENCOUNTER — Encounter (HOSPITAL_COMMUNITY): Payer: Medicare Other

## 2017-12-04 ENCOUNTER — Encounter (HOSPITAL_COMMUNITY): Payer: Medicare Other

## 2017-12-06 ENCOUNTER — Encounter (HOSPITAL_COMMUNITY): Payer: Medicare Other

## 2017-12-08 ENCOUNTER — Encounter (HOSPITAL_COMMUNITY): Payer: Medicare Other

## 2017-12-11 ENCOUNTER — Encounter (HOSPITAL_COMMUNITY): Payer: Medicare Other

## 2017-12-13 ENCOUNTER — Encounter (HOSPITAL_COMMUNITY): Payer: Medicare Other

## 2017-12-15 ENCOUNTER — Encounter (HOSPITAL_COMMUNITY): Payer: Medicare Other

## 2017-12-18 ENCOUNTER — Encounter (HOSPITAL_COMMUNITY): Payer: Medicare Other

## 2017-12-20 ENCOUNTER — Encounter (HOSPITAL_COMMUNITY): Payer: Medicare Other

## 2017-12-22 ENCOUNTER — Encounter (HOSPITAL_COMMUNITY): Payer: Medicare Other

## 2017-12-25 ENCOUNTER — Encounter (HOSPITAL_COMMUNITY): Payer: Medicare Other

## 2017-12-27 ENCOUNTER — Encounter (HOSPITAL_COMMUNITY): Payer: Medicare Other

## 2018-01-01 ENCOUNTER — Other Ambulatory Visit: Payer: Self-pay | Admitting: Internal Medicine

## 2018-01-01 DIAGNOSIS — M545 Low back pain: Principal | ICD-10-CM

## 2018-01-01 DIAGNOSIS — G8929 Other chronic pain: Secondary | ICD-10-CM

## 2018-01-12 ENCOUNTER — Ambulatory Visit
Admission: RE | Admit: 2018-01-12 | Discharge: 2018-01-12 | Disposition: A | Discharge status: Home / Self Care | Payer: Medicare Other | Source: Ambulatory Visit | Attending: Internal Medicine | Admitting: Internal Medicine

## 2018-01-12 DIAGNOSIS — M545 Low back pain: Principal | ICD-10-CM

## 2018-01-12 DIAGNOSIS — G8929 Other chronic pain: Secondary | ICD-10-CM

## 2018-01-12 MED ORDER — METHYLPREDNISOLONE ACETATE 40 MG/ML INJ SUSP (RADIOLOG
120.0000 mg | Freq: Once | INTRAMUSCULAR | Status: AC
  Administered 2018-01-12: 120 mg via EPIDURAL

## 2018-01-12 MED ORDER — IOPAMIDOL (ISOVUE-M 200) INJECTION 41%
1.0000 mL | Freq: Once | INTRAMUSCULAR | Status: AC
  Administered 2018-01-12: 1 mL via EPIDURAL

## 2018-01-12 NOTE — Discharge Instructions (Signed)

## 2018-06-11 ENCOUNTER — Other Ambulatory Visit: Payer: Self-pay

## 2018-06-11 ENCOUNTER — Emergency Department (HOSPITAL_COMMUNITY)
Admission: EM | Admit: 2018-06-11 | Discharge: 2018-06-11 | Disposition: A | Discharge status: Home / Self Care | Payer: Medicare Other | Attending: Emergency Medicine | Admitting: Emergency Medicine

## 2018-06-11 ENCOUNTER — Emergency Department (HOSPITAL_COMMUNITY): Payer: Medicare Other

## 2018-06-11 DIAGNOSIS — Z79899 Other long term (current) drug therapy: Secondary | ICD-10-CM | POA: Diagnosis not present

## 2018-06-11 DIAGNOSIS — W06XXXA Fall from bed, initial encounter: Secondary | ICD-10-CM | POA: Diagnosis not present

## 2018-06-11 DIAGNOSIS — Z955 Presence of coronary angioplasty implant and graft: Secondary | ICD-10-CM | POA: Insufficient documentation

## 2018-06-11 DIAGNOSIS — I2510 Atherosclerotic heart disease of native coronary artery without angina pectoris: Secondary | ICD-10-CM | POA: Insufficient documentation

## 2018-06-11 DIAGNOSIS — Z7982 Long term (current) use of aspirin: Secondary | ICD-10-CM | POA: Insufficient documentation

## 2018-06-11 DIAGNOSIS — I110 Hypertensive heart disease with heart failure: Secondary | ICD-10-CM | POA: Diagnosis not present

## 2018-06-11 DIAGNOSIS — Z96642 Presence of left artificial hip joint: Secondary | ICD-10-CM | POA: Diagnosis not present

## 2018-06-11 DIAGNOSIS — S2241XA Multiple fractures of ribs, right side, initial encounter for closed fracture: Secondary | ICD-10-CM | POA: Diagnosis not present

## 2018-06-11 DIAGNOSIS — Y92003 Bedroom of unspecified non-institutional (private) residence as the place of occurrence of the external cause: Secondary | ICD-10-CM | POA: Diagnosis not present

## 2018-06-11 DIAGNOSIS — I5032 Chronic diastolic (congestive) heart failure: Secondary | ICD-10-CM | POA: Diagnosis not present

## 2018-06-11 DIAGNOSIS — Y999 Unspecified external cause status: Secondary | ICD-10-CM | POA: Diagnosis not present

## 2018-06-11 DIAGNOSIS — Y9389 Activity, other specified: Secondary | ICD-10-CM | POA: Insufficient documentation

## 2018-06-11 DIAGNOSIS — J45909 Unspecified asthma, uncomplicated: Secondary | ICD-10-CM | POA: Diagnosis not present

## 2018-06-11 DIAGNOSIS — S299XXA Unspecified injury of thorax, initial encounter: Secondary | ICD-10-CM | POA: Diagnosis present

## 2018-06-11 MED ORDER — HYDROCODONE-ACETAMINOPHEN 5-325 MG PO TABS: 1.0000 | ORAL_TABLET | Freq: Four times a day (QID) | ORAL | 0 refills | Status: DC | PRN

## 2018-06-11 NOTE — ED Provider Notes (Signed)
[facility] EMERGENCY DEPARTMENT Provider Note   CSN: [id number] Arrival date & time: 06/11/18  0340     History   Chief Complaint Chief Complaint  Patient presents with  . Fall    HPI Donna Williamson is a 72 y.o. female.  The history is provided by the patient.  Fall  This is a new problem. The current episode started more than 2 days ago. The problem occurs daily. The problem has been gradually worsening. Associated symptoms include chest pain. Pertinent negatives include no headaches and no shortness of breath. Exacerbated by: Movement. The symptoms are relieved by rest.   Patient with history of CAD, hypertension presents after a fall.  She reports she was standing on her bed fixing a fan when she fell over 2 days ago.  She landed on her body.  No head injury or LOC.  No neck or back pain.  No abdominal pain She reports pain to her right chest wall.  No shortness of breath or hemoptysis. She reports it did seem to improve, but then worsened last night.  No other acute complaints Past Medical History:  Diagnosis Date  . Anemia   . Anxiety   . Arthritis    "feet, knees, back" (05/02/2016)  . Asthma   . CAD (coronary artery disease)    a. 04/2016: NSTEMI 95% stenosis 1st Mrg (s/p DES)  . Chest pain    a. 10/2013 Exercise Myoview: Ef 65%, no ischemia.  . Chronic bronchitis ([place])   . Chronic lower back pain   . GERD (gastroesophageal reflux disease)   . History of blood transfusion 1960s   "related to my hysterectomy"  . Hypertension   . Mitral valve prolapse   . NSTEMI (non-ST elevated myocardial infarction) ([place]) 05/01/2016  . Pneumonia 05/01/2016   "saw trace of slight pneumonia/CT scan" (05/02/2016)  . PONV (postoperative nausea and vomiting)   . Seasonal allergies   . Sjogren's syndrome [facility])     Patient Active Problem List   Diagnosis Date Noted  . Coronary artery disease involving native coronary artery of native heart without angina pectoris  05/09/2016  . Stented coronary artery   . NSTEMI (non-ST elevated myocardial infarction) ([place]) 05/02/2016  . Chronic diastolic CHF (congestive heart failure) ([place])   . Hyperglycemia 05/01/2016  . Anemia 05/01/2016  . Anxiety 05/01/2016  . Depression 05/01/2016  . Asthma exacerbation   . Porokeratosis 09/21/2015  . Chest pain 11/29/2013  . HTN (hypertension) 11/29/2013  . Hyperlipidemia 11/29/2013  . GERD (gastroesophageal reflux disease) 11/29/2013    Past Surgical History:  Procedure Laterality Date  . ABDOMINAL HYSTERECTOMY  1960s   "partial"  . CARDIAC CATHETERIZATION N/A 05/02/2016   Procedure: Right/Left Heart Cath and Coronary Angiography;  Surgeon: Jettie Booze, MD;  Location: [facility];  Service: Cardiovascular;  Laterality: N/A;  . CARDIAC CATHETERIZATION N/A 05/02/2016   Procedure: Coronary Stent Intervention;  Surgeon: Jettie Booze, MD;  Location: [facility];  Service: Cardiovascular;  Laterality: N/A;  . COLONOSCOPY W/ BIOPSIES AND POLYPECTOMY    . CORONARY ANGIOPLASTY WITH STENT PLACEMENT  05/02/2016   "1 stent"  . ESOPHAGOGASTRODUODENOSCOPY    . JOINT REPLACEMENT    . LAPAROSCOPIC SALPINGOOPHERECTOMY Bilateral ~ 1990  . TOTAL HIP ARTHROPLASTY Left 2007     OB History   None      Home Medications    Prior to Admission medications   Medication Sig Start Date End Date Taking? Authorizing  Provider  albuterol (PROVENTIL) (2.5 MG/3ML) 0.083% nebulizer solution Take 3 mLs (2.5 mg total) by nebulization every 2 (two) hours as needed for wheezing or shortness of breath. 05/04/16   Hosie Poisson, MD  ALPRAZolam Duanne Moron) 0.5 MG tablet Take 0.5 mg by mouth daily as needed for anxiety.     [provider]  aspirin EC 81 MG tablet Take 81 mg by mouth daily.    [provider]  Cholecalciferol (VITAMIN D3) 2000 UNITS TABS Take 1 tablet by mouth daily.    [provider]  cyanocobalamin 500 MCG tablet Take 500 mcg by mouth  daily.    [provider]  diltiazem (CARDIZEM CD) 240 MG 24 hr capsule Take 240 mg by mouth daily.  11/27/13   [provider]  doxycycline (VIBRAMYCIN) 100 MG capsule Take 1 capsule (100 mg total) by mouth 2 (two) times daily. 07/13/17   Barnet Glasgow, NP  EPIPEN 2-PAK 0.3 MG/0.3ML SOAJ injection Inject 0.3 mg into the skin daily as needed (allergic reaction).  11/01/13   [provider]  estradiol (ESTRACE) 1 MG tablet Take 0.5 mg by mouth daily.     [provider]  FLUoxetine (PROZAC) 10 MG tablet Take 10 mg by mouth daily.    [provider]  fluticasone (FLONASE) 50 MCG/ACT nasal spray Place 2 sprays into the nose daily.    [provider]  furosemide (LASIX) 20 MG tablet Take 1 tablet (20 mg total) by mouth daily. 06/05/17 09/03/17  Sherren Mocha, MD  gabapentin (NEURONTIN) 100 MG capsule Take 100 mg by mouth daily.     [provider]  Glycerin-Polysorbate 80 (REFRESH DRY EYE THERAPY OP) Place 2 drops into both eyes daily. Advance formulia    [provider]  HYDROcodone-acetaminophen (NORCO/VICODIN) 5-325 MG tablet Take 1 tablet by mouth every 6 (six) hours as needed for severe pain. 06/11/18   Ripley Fraise, MD  isosorbide mononitrate (IMDUR) 30 MG 24 hr tablet Take 15 mg by mouth daily. TAKE HALF TABLET OF 30 MG BY MOUTH (15 MG TOTAL) ONCE DAILY 07/31/16   [provider]  metoprolol tartrate (LOPRESSOR) 25 MG tablet Take 0.5 tablets (12.5 mg total) by mouth 2 (two) times daily. 04/21/17 07/21/17  Sherren Mocha, MD  montelukast (SINGULAIR) 10 MG tablet Take 10 mg by mouth at bedtime.    [provider]  Multiple Vitamin (MULTIVITAMIN WITH MINERALS) TABS tablet Take 1 tablet by mouth daily.    [provider]  mupirocin ointment (BACTROBAN) 2 % Apply 1 application topically daily as needed (skin irritation).  03/29/16   [provider]  nitroGLYCERIN (NITROSTAT) 0.4 MG SL tablet Place  1 tablet (0.4 mg total) under the tongue every 5 (five) minutes as needed for chest pain. 05/06/16   Dorothy Spark, MD  pantoprazole (PROTONIX) 40 MG tablet TAKE 1 TABLET BY MOUTH ONCE DAILY 07/24/17   Sherren Mocha, MD  prednisoLONE acetate (PRED FORTE) 1 % ophthalmic suspension Place 1 drop into both eyes 3 (three) times daily.    [provider]  PROAIR HFA 108 (90 BASE) MCG/ACT inhaler Inhale 1-2 puffs into the lungs every 4 (four) hours as needed for wheezing.  11/01/13   [provider]  rosuvastatin (CRESTOR) 20 MG tablet Take 1 tablet (20 mg total) by mouth daily at 6 PM. 05/30/16   Bhagat, Bhavinkumar, PA  triamcinolone cream (KENALOG) 0.1 % Apply 1 application topically 2 (two) times daily as needed (skin irritation).  [provider]    Family History Family History  Problem Relation Age of Onset  . Hypertension Mother   . Stroke Mother   . Heart disease Father   . Ovarian cancer Sister     Social History Social History   Tobacco Use  . Smoking status: Never Smoker  . Smokeless tobacco: Never Used  Substance Use Topics  . Alcohol use: No    Alcohol/week: 0.0 standard drinks  . Drug use: No     Allergies   Levaquin [levofloxacin]; Codeine; Macrobid [nitrofurantoin]; Ciprofloxacin; Penicillins; and Sulfa antibiotics   Review of Systems Review of Systems  Constitutional: Negative for fever.  Respiratory: Negative for shortness of breath.   Cardiovascular: Positive for chest pain.  Gastrointestinal: Negative for vomiting.  Musculoskeletal: Negative for back pain and neck pain.  Neurological: Negative for headaches.  All other systems reviewed and are negative.    Physical Exam Updated Vital Signs BP (!) 170/71   Pulse 60   Temp 98.4 F (36.9 C) (Oral)   Resp 16   Ht 1.651 m (5\' 5" )   Wt 105.2 kg   SpO2 97%   BMI 38.61 kg/m   Physical Exam CONSTITUTIONAL: Well developed/well nourished HEAD:  Normocephalic/atraumatic EYES: EOMI/PERRL ENMT: Mucous membranes moist NECK: supple no meningeal signs SPINE/BACK:entire spine nontender CV: S1/S2 noted, no murmurs/rubs/gallops noted LUNGS: Lungs are clear to auscultation bilaterally, no apparent distress Chest-diffuse right-sided chest wall tenderness, no bruising, no crepitus ABDOMEN: soft, nontender, no rebound or guarding, bowel sounds noted throughout abdomen, no bruising or right upper quadrant tenderness GU:no cva tenderness, no flank pain tenderness NEURO: Pt is awake/alert/appropriate, moves all extremitiesx4.  No facial droop.  PT is ambulatory without difficulty EXTREMITIES: pulses normal/equal, full ROM, scattered bruising to extremities, no focal tenderness.  No deformities. SKIN: warm, color normal PSYCH: no abnormalities of mood noted, alert and oriented to situation   ED Treatments / Results  Labs (all labs ordered are listed, but only abnormal results are displayed) Labs Reviewed - No data to display  EKG None  Radiology Dg Chest 2 View  Result Date: 06/11/2018 CLINICAL DATA:  Patient fell to the right side on Thursday night. Right rib cage pain getting worse. EXAM: CHEST - 2 VIEW COMPARISON:  07/21/2017 FINDINGS: Normal heart size and pulmonary vascularity. No focal airspace disease or consolidation in the lungs. No blunting of costophrenic angles. No pneumothorax. Mediastinal contours appear intact. Focal irregularities in the anterior right fifth and sixth ribs suggests acute nondisplaced rib fractures. IMPRESSION: No evidence of active pulmonary disease. Probable acute nondisplaced right anterior fifth and sixth rib fractures. Electronically Signed   By: Lucienne Capers M.D.   On: 06/11/2018 04:22    Procedures .Ortho Injury Treatment Date/Time: 06/11/2018 7:15 AM Performed by: Ripley Fraise, MD Authorized by: Ripley Fraise, MD   Consent:    Consent obtained:  Verbal   Consent given by:  PatientInjury  location: rib Location details: right sixth rib and right fifth rib Injury type: fracture Pre-procedure neurovascular assessment: neurovascularly intact Pre-procedure distal perfusion: normal Pre-procedure neurological function: normal Pre-procedure range of motion: normal Manipulation performed: no Post-procedure neurovascular assessment: post-procedure neurovascularly intact Comments: Patient found to have nondisplaced right rib fractures.  Advise use of incentive spirometer, pain control with Vicodin.  We discussed strict return precautions including fever, increasing shortness of breath, hemoptysis     Medications Ordered in ED Medications - No data to display   Initial Impression / Assessment and Plan / ED Course  I have reviewed the triage vital signs and the nursing notes. Narcotic database reviewed and considered in decision making Pertinent imaging results that were available during my care of the patient were reviewed by me and considered in my medical decision making (see chart for details).     Patient found to have right-sided rib fractures.  No pneumothorax or other complicating features.  No hypoxia.  She is well-appearing and ambulatory.  Final Clinical Impressions(s) / ED Diagnoses   Final diagnoses:  Closed fracture of multiple ribs of right side, initial encounter    ED Discharge Orders         Ordered    HYDROcodone-acetaminophen (NORCO/VICODIN) 5-325 MG tablet  Every 6 hours PRN     06/11/18 0535           Ripley Fraise, MD 06/11/18 [phone]

## 2018-06-11 NOTE — ED Triage Notes (Signed)
Patient c/o fall on Thursday night and fell on her right side. States that she thought she was getting better but states that she "moved wrong" tonight and made it worse.

## 2018-06-11 NOTE — Progress Notes (Signed)
Patient educated on Incentive spirometer and had knowledge of use. Patient displayed good effort.

## 2018-06-11 NOTE — ED Notes (Signed)
Patient was on way to xray and they said that they would bring patient to pod C

## 2018-06-11 NOTE — ED Notes (Signed)
Pt discharged from ED; instructions provided and scripts given; Pt encouraged to return to ED if symptoms worsen and to f/u with PCP; Pt verbalized understanding of all instructions 

## 2018-07-11 ENCOUNTER — Ambulatory Visit: Payer: Medicare Other | Admitting: Cardiovascular Disease

## 2018-07-11 ENCOUNTER — Encounter: Payer: Self-pay | Admitting: Cardiovascular Disease

## 2018-07-11 VITALS — BP 132/70 | HR 64 | Ht 65.0 in | Wt 239.2 lb

## 2018-07-11 DIAGNOSIS — I25119 Atherosclerotic heart disease of native coronary artery with unspecified angina pectoris: Secondary | ICD-10-CM

## 2018-07-11 DIAGNOSIS — I5032 Chronic diastolic (congestive) heart failure: Secondary | ICD-10-CM | POA: Diagnosis not present

## 2018-07-11 DIAGNOSIS — R0602 Shortness of breath: Secondary | ICD-10-CM | POA: Diagnosis not present

## 2018-07-11 MED ORDER — FUROSEMIDE 40 MG PO TABS: 40.0000 mg | ORAL_TABLET | Freq: Every day | ORAL | 3 refills | Status: DC

## 2018-07-11 NOTE — Progress Notes (Signed)
Cardiology Office Note:    Date:  07/12/2018   ID:  [name], [name] 1946-06-11, MRN [id number]  PCP:  Crist Infante, MD  Cardiologist:  Sherren Mocha, MD  Electrophysiologist:  None   Referring MD: Crist Infante, MD   Chief Complaint  Patient presents with  . Shortness of Breath    History of Present Illness:    Donna Williamson is a 72 y.o. female with a hx of coronary artery disease and chronic diastolic heart failure.  She presents today for follow-up evaluation.  She initially presented in 2017 with a non-STEMI and was found to have severe stenosis of the first OM branch of the circumflex.  She was treated with PCI using a Synergy drug-eluting stent.  She was noted to have moderate residual disease in the LAD.  The patient also was diagnosed with diastolic heart failure and a high filling pressures at cardiac catheterization.  She is been maintained on low-dose furosemide.  She is here alone today.  She complains of worsening shortness of breath.  She has been sedentary and has gained weight.  She occasionally has central chest pressure but no insistent relationship to physical exertion.  She denies orthopnea or PND.  Leg swelling has been well controlled on diuretic therapy.  She denies heart palpitations, lightheadedness, or syncope.  Past Medical History:  Diagnosis Date  . Anemia   . Anxiety   . Arthritis    "feet, knees, back" (05/02/2016)  . Asthma   . CAD (coronary artery disease)    a. 04/2016: NSTEMI 95% stenosis 1st Mrg (s/p DES)  . Chest pain    a. 10/2013 Exercise Myoview: Ef 65%, no ischemia.  . Chronic bronchitis ([place])   . Chronic lower back pain   . GERD (gastroesophageal reflux disease)   . History of blood transfusion 1960s   "related to my hysterectomy"  . Hypertension   . Mitral valve prolapse   . NSTEMI (non-ST elevated myocardial infarction) ([place]) 05/01/2016  . Pneumonia 05/01/2016   "saw trace of slight pneumonia/CT scan" (05/02/2016)  . PONV  (postoperative nausea and vomiting)   . Seasonal allergies   . Sjogren's syndrome [facility])     Past Surgical History:  Procedure Laterality Date  . ABDOMINAL HYSTERECTOMY  1960s   "partial"  . CARDIAC CATHETERIZATION N/A 05/02/2016   Procedure: Right/Left Heart Cath and Coronary Angiography;  Surgeon: Jettie Booze, MD;  Location: [place] [facility];  Service: Cardiovascular;  Laterality: N/A;  . CARDIAC CATHETERIZATION N/A 05/02/2016   Procedure: Coronary Stent Intervention;  Surgeon: Jettie Booze, MD;  Location: [place] [facility];  Service: Cardiovascular;  Laterality: N/A;  . COLONOSCOPY W/ BIOPSIES AND POLYPECTOMY    . CORONARY ANGIOPLASTY WITH STENT PLACEMENT  05/02/2016   "1 stent"  . ESOPHAGOGASTRODUODENOSCOPY    . JOINT REPLACEMENT    . LAPAROSCOPIC SALPINGOOPHERECTOMY Bilateral ~ 1990  . TOTAL HIP ARTHROPLASTY Left 2007    Current Medications: Current Meds  Medication Sig  . albuterol (PROVENTIL) (2.5 MG/3ML) 0.083% nebulizer solution Take 3 mLs (2.5 mg total) by nebulization every 2 (two) hours as needed for wheezing or shortness of breath.  . ALPRAZolam (XANAX) 0.5 MG tablet Take 0.5 mg by mouth daily as needed for anxiety.   Marland Kitchen aspirin EC 81 MG tablet Take 81 mg by mouth daily.  . Cholecalciferol (VITAMIN D3) 2000 UNITS TABS Take 1 tablet by mouth daily.  . cyanocobalamin 500 MCG tablet Take 500 mcg by mouth daily.  Marland Kitchen  diltiazem (CARDIZEM CD) 240 MG 24 hr capsule Take 240 mg by mouth daily.   Marland Kitchen EPIPEN 2-PAK 0.3 MG/0.3ML SOAJ injection Inject 0.3 mg into the skin daily as needed (allergic reaction).   Marland Kitchen estradiol (ESTRACE) 1 MG tablet Take 1 mg by mouth daily.   Marland Kitchen FLUoxetine (PROZAC) 10 MG tablet Take 10 mg by mouth daily.  . fluticasone (FLONASE) 50 MCG/ACT nasal spray Place 2 sprays into the nose daily.  Marland Kitchen gabapentin (NEURONTIN) 100 MG capsule Take 100 mg by mouth daily.   . Glycerin-Polysorbate 80 (REFRESH DRY EYE THERAPY OP) Place 2 drops into both eyes daily.  Advance formulia  . isosorbide mononitrate (IMDUR) 30 MG 24 hr tablet Take 15 mg by mouth daily. TAKE HALF TABLET OF 30 MG BY MOUTH (15 MG TOTAL) ONCE DAILY  . montelukast (SINGULAIR) 10 MG tablet Take 10 mg by mouth at bedtime.  . Multiple Vitamin (MULTIVITAMIN WITH MINERALS) TABS tablet Take 1 tablet by mouth daily.  . mupirocin ointment (BACTROBAN) 2 % Apply 1 application topically daily as needed (skin irritation).   . nitroGLYCERIN (NITROSTAT) 0.4 MG SL tablet Place 1 tablet (0.4 mg total) under the tongue every 5 (five) minutes as needed for chest pain.  . pantoprazole (PROTONIX) 40 MG tablet TAKE 1 TABLET BY MOUTH ONCE DAILY  . prednisoLONE acetate (PRED FORTE) 1 % ophthalmic suspension Place 1 drop into both eyes 3 (three) times daily.  Marland Kitchen PROAIR HFA 108 (90 BASE) MCG/ACT inhaler Inhale 1-2 puffs into the lungs every 4 (four) hours as needed for wheezing.   . rosuvastatin (CRESTOR) 20 MG tablet Take 1 tablet (20 mg total) by mouth daily at 6 PM.  . triamcinolone cream (KENALOG) 0.1 % Apply 1 application topically 2 (two) times daily as needed (skin irritation).     Allergies:   Levaquin [levofloxacin]; Codeine; Macrobid [nitrofurantoin]; Ciprofloxacin; Penicillins; and Sulfa antibiotics   Social History   Socioeconomic History  . Marital status: Married    Spouse name: Not on file  . Number of children: Not on file  . Years of education: Not on file  . Highest education level: Not on file  Occupational History  . Not on file  Social Needs  . Financial resource strain: Not on file  . Food insecurity:    Worry: Not on file    Inability: Not on file  . Transportation needs:    Medical: Not on file    Non-medical: Not on file  Tobacco Use  . Smoking status: Never Smoker  . Smokeless tobacco: Never Used  Substance and Sexual Activity  . Alcohol use: No    Alcohol/week: 0.0 standard drinks  . Drug use: No  . Sexual activity: Yes  Lifestyle  . Physical activity:    Days per  week: Not on file    Minutes per session: Not on file  . Stress: Not on file  Relationships  . Social connections:    Talks on phone: Not on file    Gets together: Not on file    Attends religious service: Not on file    Active member of club or organization: Not on file    Attends meetings of clubs or organizations: Not on file    Relationship status: Not on file  Other Topics Concern  . Not on file  Social History Narrative   Lives in [place] with husband.  Active but does not routinely exercise.     Family History: The patient's family history includes Heart  disease in her father; Hypertension in her mother; Ovarian cancer in her sister; Stroke in her mother.  ROS:   Please see the history of present illness.    Positive for balance problems, leg pain, back pain. All other systems reviewed and are negative.  EKGs/Labs/Other Studies Reviewed:    The following studies were reviewed today: Echo 05-01-2016: Left ventricle:  The cavity size was normal. Wall thickness was normal. Systolic function was normal. The estimated ejection fraction was in the range of 55% to 60%. Wall motion was normal; there were no regional wall motion abnormalities. The study is not technically sufficient to allow evaluation of LV diastolic function.  ------------------------------------------------------------------- Aortic valve:   Trileaflet; mildly thickened leaflets. Mobility was not restricted.  Doppler:  Transvalvular velocity was within the normal range. There was no stenosis. There was no regurgitation.   ------------------------------------------------------------------- Aorta:  Aortic root: The aortic root was normal in size.  ------------------------------------------------------------------- Mitral valve:   Structurally normal valve.   Mobility was not restricted.  Doppler:  Transvalvular velocity was within the normal range. There was no evidence for stenosis. There was  mild regurgitation.    Peak gradient (D): 11 mm Hg.  ------------------------------------------------------------------- Left atrium:  The atrium was normal in size.  ------------------------------------------------------------------- Right ventricle:  The cavity size was normal. Systolic function was normal.  ------------------------------------------------------------------- Pulmonic valve:    Doppler:  Transvalvular velocity was within the normal range. There was no evidence for stenosis.  ------------------------------------------------------------------- Tricuspid valve:   Structurally normal valve.    Doppler: Transvalvular velocity was within the normal range. There was no regurgitation.  ------------------------------------------------------------------- Right atrium:  The atrium was normal in size.  ------------------------------------------------------------------- Pericardium:  There was no pericardial effusion.  ------------------------------------------------------------------- Systemic veins: Inferior vena cava: The vessel was normal in size.  Cardiac Cath 05-02-2016: Conclusion    Prox LAD to Mid LAD lesion, 30% stenosed.  Mid LAD lesion, 50% stenosed at bifurcation of Ost 2nd Diag to 2nd Diag lesion, 70% stenosed.  1st Mrg-1 lesion, 95% stenosed. Post intervention with a 2.5 x 16 Synergy drug eluting stent post dilated to 2.8 mm, there is a 0% residual stenosis.  Moderate pulmonary HTN.  Elevated LVEDP. CO 8.7 L/min. CI 3.96.   Continue dual antiplatelet therapy ideally for a year. She does have anemia but has had bleeding source is ruled out. Brilinta started. She would be a candidate for TWILIGHT study.  She will need some diuresis over the next few days.    EKG:  EKG is ordered today.  The ekg ordered today demonstrates normal sinus rhythm 64 bpm with occasional PVCs, possible left atrial enlargement, and nonspecific ST abnormality.  Recent  Labs: 07/21/2017: ALT 15; Hemoglobin 12.2; Platelets 385 07/22/2017: Magnesium 2.1 07/11/2018: BUN 20; Creatinine, Ser 1.33; NT-Pro BNP 1,449; Potassium 5.0; Sodium 138   Recent Lipid Panel    Component Value Date/Time   CHOL 132 08/09/2016 0738   TRIG 201 (H) 08/09/2016 0738   HDL 42 (L) 08/09/2016 0738   CHOLHDL 3.1 08/09/2016 0738   VLDL 40 (H) 08/09/2016 0738   LDLCALC 50 08/09/2016 0738    Physical Exam:    VS:  BP 132/70   Pulse 64   Ht 5\' 5"  (1.651 m)   Wt 239 lb 3.2 oz (108.5 kg)   SpO2 92%   BMI 39.80 kg/m     Wt Readings from Last 3 Encounters:  07/11/18 239 lb 3.2 oz (108.5 kg)  06/11/18 232 lb (105.2 kg)  08/14/17  218 lb 1.9 oz (98.9 kg)     GEN:  Well nourished, well developed, obese woman in no acute distress HEENT: Normal NECK: No JVD; No carotid bruits LYMPHATICS: No lymphadenopathy CARDIAC: RRR, no murmurs, rubs, gallops RESPIRATORY:  Clear to auscultation without rales, wheezing or rhonchi  ABDOMEN: Soft, non-tender, non-distended MUSCULOSKELETAL:  Trace BL leg edema; No deformity  SKIN: Warm and dry NEUROLOGIC:  Alert and oriented x 3 PSYCHIATRIC:  Normal affect   ASSESSMENT:    1. Shortness of breath   2. Chronic diastolic heart failure ([place])   3. Coronary artery disease involving native coronary artery of native heart with angina pectoris ([place])    PLAN:    In order of problems listed above:  1. Worsening shortness of breath may be related to weight gain, as her weight is up 20 pounds from last year's visit.  However, she does have ischemic heart disease and diastolic dysfunction.  I recommended an echocardiogram and a Lexiscan Myoview stress test to evaluate her ischemic burden with known stenting of the circumflex and moderate LAD stenosis in the past. 2. The patient will increase furosemide to 40 mg daily.  I will check a metabolic panel and BNP today. 3. She remains on aspirin for antiplatelet therapy and a high intensity statin drug.  Her  last lipids are reviewed and she has an upcoming physical exam with Dr. Joylene Draft. 4. We discussed lifestyle modification at length today.  She understands the need to lose weight and will do her best with diet and exercise.   Medication Adjustments/Labs and Tests Ordered: Current medicines are reviewed at length with the patient today.  Concerns regarding medicines are outlined above.  Orders Placed This Encounter  Procedures  . Basic metabolic panel  . Pro b natriuretic peptide (BNP)  . MYOCARDIAL PERFUSION IMAGING  . EKG 12-Lead  . ECHOCARDIOGRAM COMPLETE   Meds ordered this encounter  Medications  . furosemide (LASIX) 40 MG tablet    Sig: Take 1 tablet (40 mg total) by mouth daily.    Dispense:  90 tablet    Refill:  3    Patient Instructions  Medication Instructions:  1) INCREASE LASIX to 40 mg daily  Labwork: TODAY: BMET, BNP  Testing/Procedures: Your provider has requested that you have a lexiscan myoview. For further information please visit [email or web address]. Please follow instruction sheet, as given.  Your provider has requested that you have an echocardiogram. Echocardiography is a painless test that uses sound waves to create images of your heart. It provides your doctor with information about the size and shape of your heart and how well your heart's chambers and valves are working. This procedure takes approximately one hour. There are no restrictions for this procedure.  Follow-Up: Your provider wants you to follow-up in: 6 months with Dr. Antionette Char assistant, Nicki Reaper. You will receive a reminder letter in the mail two months in advance. If you don't receive a letter, please call our office to schedule the follow-up appointment.    Any Other Special Instructions Will Be Listed Below (If Applicable).     If you need a refill on your cardiac medications before your next appointment, please call your pharmacy.      Signed, Sherren Mocha, MD  07/12/2018 8:12 AM     [place]

## 2018-07-11 NOTE — Patient Instructions (Signed)
Medication Instructions:  1) INCREASE LASIX to 40 mg daily  Labwork: TODAY: BMET, BNP  Testing/Procedures: Your provider has requested that you have a lexiscan myoview. For further information please visit [email or web address]. Please follow instruction sheet, as given.  Your provider has requested that you have an echocardiogram. Echocardiography is a painless test that uses sound waves to create images of your heart. It provides your doctor with information about the size and shape of your heart and how well your heart's chambers and valves are working. This procedure takes approximately one hour. There are no restrictions for this procedure.  Follow-Up: Your provider wants you to follow-up in: 6 months with Dr. Antionette Char assistant, Nicki Reaper. You will receive a reminder letter in the mail two months in advance. If you don't receive a letter, please call our office to schedule the follow-up appointment.    Any Other Special Instructions Will Be Listed Below (If Applicable).     If you need a refill on your cardiac medications before your next appointment, please call your pharmacy.

## 2018-07-12 LAB — BASIC METABOLIC PANEL
BUN/Creatinine Ratio: 15 (ref 12–28)
BUN: 20 mg/dL (ref 8–27)
CO2: 19 mmol/L — ABNORMAL LOW (ref 20–29)
CREATININE: 1.33 mg/dL — AB (ref 0.57–1.00)
Calcium: 9.2 mg/dL (ref 8.7–10.3)
Chloride: 102 mmol/L (ref 96–106)
GFR, EST AFRICAN AMERICAN: 45 mL/min/{1.73_m2} — AB (ref >59)
GFR, EST NON AFRICAN AMERICAN: 39 mL/min/{1.73_m2} — AB (ref >59)
Glucose: 91 mg/dL (ref 65–99)
Potassium: 5 mmol/L (ref 3.5–5.2)
Sodium: 138 mmol/L (ref 134–144)

## 2018-07-12 LAB — PRO B NATRIURETIC PEPTIDE: NT-Pro BNP: 1449 pg/mL — ABNORMAL HIGH (ref 0–301)

## 2018-07-17 ENCOUNTER — Telehealth: Payer: Self-pay | Admitting: Cardiovascular Disease

## 2018-07-17 NOTE — Telephone Encounter (Signed)
New Message   Patient is calling in reference to lab results. Please call.

## 2018-07-17 NOTE — Telephone Encounter (Signed)
The patient reports she never listened to her VM with results. Reiterated results below to patient. She was grateful for call back.   "Notes recorded by Theodoro Parma, RN on 07/12/2018 at 12:36 PM EDT Per DPR form, left detailed message with results on VM. Reiterated the importance of staying on increased dosage of Lasix and keeping upcoming appointments. Instructed her to call with questions or concerns."

## 2018-07-18 ENCOUNTER — Telehealth (HOSPITAL_COMMUNITY): Payer: Self-pay | Admitting: *Deleted

## 2018-07-18 NOTE — Telephone Encounter (Signed)
Patient given detailed instructions per Myocardial Perfusion Study Information Sheet for the test on 07/25/18 at . Patient notified to arrive 15 minutes early and that it is imperative to arrive on time for appointment to keep from having the test rescheduled.  If you need to cancel or reschedule your appointment, please call the office within 24 hours of your appointment. . Patient verbalized understanding. Kirstie Peri

## 2018-07-25 ENCOUNTER — Ambulatory Visit (HOSPITAL_BASED_OUTPATIENT_CLINIC_OR_DEPARTMENT_OTHER): Payer: Medicare Other

## 2018-07-25 ENCOUNTER — Other Ambulatory Visit: Payer: Self-pay

## 2018-07-25 ENCOUNTER — Ambulatory Visit (HOSPITAL_COMMUNITY): Payer: Medicare Other | Attending: Cardiology

## 2018-07-25 VITALS — BP 119/96

## 2018-07-25 DIAGNOSIS — R0602 Shortness of breath: Secondary | ICD-10-CM

## 2018-07-25 DIAGNOSIS — I2510 Atherosclerotic heart disease of native coronary artery without angina pectoris: Secondary | ICD-10-CM | POA: Insufficient documentation

## 2018-07-25 DIAGNOSIS — I5032 Chronic diastolic (congestive) heart failure: Secondary | ICD-10-CM | POA: Diagnosis not present

## 2018-07-25 DIAGNOSIS — I252 Old myocardial infarction: Secondary | ICD-10-CM | POA: Insufficient documentation

## 2018-07-25 DIAGNOSIS — I110 Hypertensive heart disease with heart failure: Secondary | ICD-10-CM | POA: Insufficient documentation

## 2018-07-25 LAB — ECHOCARDIOGRAM COMPLETE

## 2018-07-25 MED ORDER — TECHNETIUM TC 99M TETROFOSMIN IV KIT
31.7000 | PACK | Freq: Once | INTRAVENOUS | Status: AC | PRN
  Administered 2018-07-25: 31.7 via INTRAVENOUS
  Filled 2018-07-25: qty 32

## 2018-07-25 MED ORDER — PERFLUTREN LIPID MICROSPHERE
1.0000 mL | INTRAVENOUS | Status: AC | PRN
  Administered 2018-07-25: 1 mL via INTRAVENOUS

## 2018-07-25 MED ORDER — REGADENOSON 0.4 MG/5ML IV SOLN
0.4000 mg | Freq: Once | INTRAVENOUS | Status: AC
  Administered 2018-07-25: 0.4 mg via INTRAVENOUS

## 2018-07-26 ENCOUNTER — Ambulatory Visit (HOSPITAL_COMMUNITY): Payer: Medicare Other | Attending: Cardiovascular Disease

## 2018-07-26 LAB — MYOCARDIAL PERFUSION IMAGING
CHL CUP RESTING HR STRESS: 67 {beats}/min
LV dias vol: 108 mL (ref 46–106)
LV sys vol: 52 mL
Peak HR: 82 {beats}/min
SDS: 2
SRS: 0
SSS: 2
TID: 1.01

## 2018-07-26 MED ORDER — TECHNETIUM TC 99M TETROFOSMIN IV KIT
32.6000 | PACK | Freq: Once | INTRAVENOUS | Status: AC | PRN
  Administered 2018-07-26: 32.6 via INTRAVENOUS
  Filled 2018-07-26: qty 33
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
## 2018-08-01 ENCOUNTER — Telehealth: Payer: Self-pay | Admitting: Cardiovascular Disease

## 2018-08-01 NOTE — Telephone Encounter (Signed)
Informed patient of results and verbal understanding expressed.  

## 2018-08-01 NOTE — Telephone Encounter (Signed)
New message   Patient is returning call for ECHO and Nuclear Stress Test Results.

## 2018-08-01 NOTE — Telephone Encounter (Signed)
-----   Message from Sherren Mocha, MD sent at 07/30/2018  2:25 AM EDT ----- Normal perfusion study. Continue current medical therapy. thx

## 2018-08-01 NOTE — Telephone Encounter (Signed)
-----   Message from Sherren Mocha, MD sent at 07/25/2018 10:57 PM EDT ----- Normal cardiac function except mild heart stiffening. Mild degenerative changes of the aortic valve but the valve is functioning normally. Overall good echo result. Continue current medicines.

## 2018-08-21 ENCOUNTER — Telehealth (HOSPITAL_COMMUNITY): Payer: Self-pay | Admitting: *Deleted

## 2018-09-10 ENCOUNTER — Telehealth (HOSPITAL_COMMUNITY): Payer: Self-pay | Admitting: Internal Medicine

## 2018-09-10 ENCOUNTER — Encounter (HOSPITAL_COMMUNITY): Payer: Self-pay

## 2018-09-12 ENCOUNTER — Encounter (HOSPITAL_COMMUNITY): Payer: Self-pay

## 2018-09-14 ENCOUNTER — Encounter (HOSPITAL_COMMUNITY): Payer: Self-pay

## 2018-09-17 ENCOUNTER — Encounter (HOSPITAL_COMMUNITY): Payer: Self-pay

## 2018-09-19 ENCOUNTER — Encounter (HOSPITAL_COMMUNITY): Payer: Self-pay

## 2018-09-21 ENCOUNTER — Encounter (HOSPITAL_COMMUNITY): Payer: Self-pay

## 2018-09-24 ENCOUNTER — Encounter (HOSPITAL_COMMUNITY): Payer: Self-pay

## 2018-09-26 ENCOUNTER — Encounter (HOSPITAL_COMMUNITY): Payer: Self-pay

## 2018-10-01 ENCOUNTER — Encounter (HOSPITAL_COMMUNITY): Payer: Self-pay

## 2018-10-03 ENCOUNTER — Encounter (HOSPITAL_COMMUNITY): Payer: Self-pay

## 2018-10-05 ENCOUNTER — Encounter (HOSPITAL_COMMUNITY): Payer: Self-pay

## 2018-10-08 ENCOUNTER — Encounter (HOSPITAL_COMMUNITY): Payer: Self-pay

## 2018-10-10 ENCOUNTER — Encounter (HOSPITAL_COMMUNITY): Payer: Self-pay

## 2018-10-12 ENCOUNTER — Encounter (HOSPITAL_COMMUNITY): Payer: Self-pay

## 2018-10-15 ENCOUNTER — Encounter (HOSPITAL_COMMUNITY): Payer: Self-pay

## 2018-10-17 ENCOUNTER — Encounter (HOSPITAL_COMMUNITY): Payer: Self-pay

## 2018-10-19 ENCOUNTER — Encounter (HOSPITAL_COMMUNITY): Payer: Self-pay

## 2018-10-22 ENCOUNTER — Encounter (HOSPITAL_COMMUNITY): Payer: Self-pay

## 2018-10-26 ENCOUNTER — Encounter (HOSPITAL_COMMUNITY): Payer: Self-pay

## 2018-10-29 ENCOUNTER — Encounter (HOSPITAL_COMMUNITY): Payer: Self-pay

## 2018-11-02 ENCOUNTER — Encounter (HOSPITAL_COMMUNITY): Payer: Self-pay

## 2018-11-05 ENCOUNTER — Encounter (HOSPITAL_COMMUNITY): Payer: Self-pay

## 2018-11-07 ENCOUNTER — Encounter (HOSPITAL_COMMUNITY): Payer: Self-pay

## 2018-11-09 ENCOUNTER — Encounter (HOSPITAL_COMMUNITY): Payer: Self-pay

## 2018-11-12 ENCOUNTER — Encounter (HOSPITAL_COMMUNITY): Payer: Self-pay

## 2018-11-14 ENCOUNTER — Encounter (HOSPITAL_COMMUNITY): Payer: Self-pay

## 2018-11-16 ENCOUNTER — Encounter (HOSPITAL_COMMUNITY): Payer: Self-pay

## 2018-11-19 ENCOUNTER — Encounter (HOSPITAL_COMMUNITY): Payer: Self-pay

## 2018-11-21 ENCOUNTER — Encounter (HOSPITAL_COMMUNITY): Payer: Self-pay

## 2018-11-23 ENCOUNTER — Encounter (HOSPITAL_COMMUNITY): Payer: Self-pay

## 2018-11-26 ENCOUNTER — Encounter (HOSPITAL_COMMUNITY): Payer: Self-pay

## 2018-11-28 ENCOUNTER — Encounter (HOSPITAL_COMMUNITY): Payer: Self-pay

## 2018-11-30 ENCOUNTER — Encounter (HOSPITAL_COMMUNITY): Payer: Self-pay

## 2018-12-03 ENCOUNTER — Encounter (HOSPITAL_COMMUNITY): Payer: Self-pay

## 2018-12-05 ENCOUNTER — Encounter (HOSPITAL_COMMUNITY): Payer: Self-pay

## 2018-12-07 ENCOUNTER — Encounter (HOSPITAL_COMMUNITY): Payer: Self-pay

## 2018-12-10 ENCOUNTER — Encounter (HOSPITAL_COMMUNITY): Payer: Self-pay

## 2018-12-12 ENCOUNTER — Encounter (HOSPITAL_COMMUNITY): Payer: Self-pay

## 2018-12-14 ENCOUNTER — Encounter (HOSPITAL_COMMUNITY): Payer: Self-pay

## 2018-12-17 ENCOUNTER — Encounter (HOSPITAL_COMMUNITY): Payer: Self-pay

## 2018-12-19 ENCOUNTER — Encounter (HOSPITAL_COMMUNITY): Payer: Self-pay

## 2018-12-21 ENCOUNTER — Encounter (HOSPITAL_COMMUNITY): Payer: Self-pay

## 2018-12-24 ENCOUNTER — Encounter (HOSPITAL_COMMUNITY): Payer: Self-pay

## 2018-12-26 ENCOUNTER — Encounter (HOSPITAL_COMMUNITY): Payer: Self-pay

## 2018-12-28 ENCOUNTER — Encounter (HOSPITAL_COMMUNITY): Payer: Self-pay

## 2018-12-31 ENCOUNTER — Encounter (HOSPITAL_COMMUNITY): Payer: Self-pay

## 2019-01-02 ENCOUNTER — Encounter (HOSPITAL_COMMUNITY): Payer: Self-pay

## 2019-01-04 ENCOUNTER — Encounter (HOSPITAL_COMMUNITY): Payer: Self-pay

## 2019-01-07 ENCOUNTER — Encounter (HOSPITAL_COMMUNITY): Payer: Self-pay

## 2019-01-09 ENCOUNTER — Encounter (HOSPITAL_COMMUNITY): Payer: Self-pay

## 2019-01-11 ENCOUNTER — Encounter (HOSPITAL_COMMUNITY): Payer: Self-pay

## 2019-01-14 ENCOUNTER — Encounter (HOSPITAL_COMMUNITY): Payer: Self-pay

## 2019-01-16 ENCOUNTER — Encounter (HOSPITAL_COMMUNITY): Payer: Self-pay

## 2019-01-18 ENCOUNTER — Encounter (HOSPITAL_COMMUNITY): Payer: Self-pay

## 2019-01-21 ENCOUNTER — Encounter (HOSPITAL_COMMUNITY): Payer: Self-pay

## 2019-01-21 ENCOUNTER — Telehealth: Payer: Self-pay

## 2019-01-21 NOTE — Telephone Encounter (Signed)
Called to check on patient and see if she is able to either be rescheduled to a later date or if she is interested in [place] appointment on 3/26.  Left message to call back.

## 2019-01-22 NOTE — Telephone Encounter (Signed)
Spoke with pt and she denies any cardiac issues.  She would like to put off appt for now, no e-visit.  Advised to contact office if any issues in the meantime.  Pt verbalized understanding and was appreciative for call.

## 2019-01-23 ENCOUNTER — Encounter (HOSPITAL_COMMUNITY): Payer: Self-pay

## 2019-01-24 ENCOUNTER — Ambulatory Visit: Payer: Medicare Other | Admitting: Cardiovascular Disease

## 2019-01-25 ENCOUNTER — Encounter (HOSPITAL_COMMUNITY): Payer: Self-pay

## 2019-01-28 ENCOUNTER — Encounter (HOSPITAL_COMMUNITY): Payer: Self-pay

## 2019-01-30 ENCOUNTER — Encounter (HOSPITAL_COMMUNITY): Payer: Self-pay

## 2019-01-30 ENCOUNTER — Telehealth: Payer: Self-pay | Admitting: Cardiovascular Disease

## 2019-01-30 NOTE — Telephone Encounter (Signed)
The patient is now willing to attempt a Webex visit tomorrow with Dr. Burt Knack.  Scheduled her for 1000 and sent her consent and Webex sign up instructions via MyChart. She understands she will be called prior to appointment to confirm medications and obtain VS. She was grateful for assistance.

## 2019-01-30 NOTE — Telephone Encounter (Signed)
Left message on voicemail to call back and walk patient through setting up Webex appointment.

## 2019-01-31 ENCOUNTER — Telehealth (INDEPENDENT_AMBULATORY_CARE_PROVIDER_SITE_OTHER): Payer: Medicare Other | Admitting: Cardiovascular Disease

## 2019-01-31 ENCOUNTER — Encounter: Payer: Self-pay | Admitting: Cardiovascular Disease

## 2019-01-31 ENCOUNTER — Other Ambulatory Visit: Payer: Self-pay

## 2019-01-31 VITALS — BP 126/62 | HR 63 | Ht 65.5 in | Wt 220.2 lb

## 2019-01-31 DIAGNOSIS — I2510 Atherosclerotic heart disease of native coronary artery without angina pectoris: Secondary | ICD-10-CM

## 2019-01-31 DIAGNOSIS — I5032 Chronic diastolic (congestive) heart failure: Secondary | ICD-10-CM | POA: Diagnosis not present

## 2019-01-31 DIAGNOSIS — E782 Mixed hyperlipidemia: Secondary | ICD-10-CM

## 2019-01-31 DIAGNOSIS — I10 Essential (primary) hypertension: Secondary | ICD-10-CM

## 2019-01-31 NOTE — Telephone Encounter (Signed)
Patient is not complete set up for Webex - called her and got app downloaded, she hung up on me said Donna Williamson was calling her?

## 2019-01-31 NOTE — Progress Notes (Signed)
Virtual Visit via Video Note    Evaluation Performed:  Follow-up visit  This visit type was conducted due to national recommendations for restrictions regarding the COVID-19 Pandemic (e.g. social distancing).  This format is felt to be most appropriate for this patient at this time.  All issues noted in this document were discussed and addressed.  No physical exam was performed (except for noted visual exam findings with Video Visits).  Please refer to the patient's chart (MyChart message for video visits and phone note for telephone visits) for the patient's consent to telehealth for [facility].  Date:  01/31/2019   ID:  Donna Williamson, Donna Williamson 05/23/1946, MRN [id number]  Patient Location:  Home  Provider location:   Home  PCP:  Crist Infante, MD  Cardiologist:  Sherren Mocha, MD  Electrophysiologist:  None   Chief Complaint: Shortness of breath  History of Present Illness:    Donna Williamson is a 73 y.o. female who presents via audio/video conferencing for a telehealth visit today.    The patient does not have symptoms concerning for COVID-19 infection (fever, chills, cough, or new shortness of breath).   The patient has a hx of coronary artery disease and chronic diastolic heart failure.  She initially presented in 2017 with a non-STEMI and was found to have severe stenosis of the first OM branch of the circumflex.  She was treated with PCI using a Synergy drug-eluting stent.  She was noted to have moderate residual disease in the LAD.  The patient also was diagnosed with diastolic heart failure and a high filling pressures at cardiac catheterization.  She is been maintained on low-dose furosemide.  At the time of her last visit in September 2019 she complained of worsening shortness of breath.  Echo and nuclear stress studies were performed.  Both studies demonstrated reassuring results, see below for details.  The patient is interviewed via a video conference today.  She is  actually doing quite well.  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  She denies symptoms of chest pain, chest pressure, edema, orthopnea, PND, or heart palpitations.  She has mild shortness of breath with activity but this is improved.  She has lost about 15 pounds over the past 6 months.  Prior CV studies:   The following studies were reviewed today:  2D echocardiogram 07/25/2018: Study Conclusions  - Left ventricle: The cavity size was normal. Systolic function was   normal. The estimated ejection fraction was 55%. Wall motion was   normal; there were no regional wall motion abnormalities. There   was an increased relative contribution of atrial contraction to   ventricular filling. Doppler parameters are consistent with   abnormal left ventricular relaxation (grade 1 diastolic   dysfunction). - Aortic valve: Trileaflet; mildly thickened, moderately calcified   leaflets. - Mitral valve: There was trivial regurgitation. - Left atrium: The atrium was mildly dilated. - Right ventricle: The cavity size was mildly dilated. Wall   thickness was normal.  Myocardial perfusion study 07/26/2018: Study Highlights     Nuclear stress EF: 52%.  There was no ST segment deviation noted during stress.  The left ventricular ejection fraction is mildly decreased (45-54%).  This is a low risk study.   Normal perfusion but LV appears dilated with apical hypokinesis EF low normal 52%  No infarct or ischemia     Past Medical History:  Diagnosis Date  .  Anemia   . Anxiety   . Arthritis    "feet, knees, back" (05/02/2016)  . Asthma   . CAD (coronary artery disease)    a. 04/2016: NSTEMI 95% stenosis 1st Mrg (s/p DES)  . Chest pain    a. 10/2013 Exercise Myoview: Ef 65%, no ischemia.  . Chronic bronchitis ([place])   . Chronic lower back pain   . GERD (gastroesophageal reflux disease)   . History of blood transfusion 1960s    "related to my hysterectomy"  . Hypertension   . Mitral valve prolapse   . NSTEMI (non-ST elevated myocardial infarction) ([place]) 05/01/2016  . Pneumonia 05/01/2016   "saw trace of slight pneumonia/CT scan" (05/02/2016)  . PONV (postoperative nausea and vomiting)   . Seasonal allergies   . Sjogren's syndrome [facility])    Past Surgical History:  Procedure Laterality Date  . ABDOMINAL HYSTERECTOMY  1960s   "partial"  . CARDIAC CATHETERIZATION N/A 05/02/2016   Procedure: Right/Left Heart Cath and Coronary Angiography;  Surgeon: Jettie Booze, MD;  Location: [place] [facility];  Service: Cardiovascular;  Laterality: N/A;  . CARDIAC CATHETERIZATION N/A 05/02/2016   Procedure: Coronary Stent Intervention;  Surgeon: Jettie Booze, MD;  Location: [place] [facility];  Service: Cardiovascular;  Laterality: N/A;  . COLONOSCOPY W/ BIOPSIES AND POLYPECTOMY    . CORONARY ANGIOPLASTY WITH STENT PLACEMENT  05/02/2016   "1 stent"  . ESOPHAGOGASTRODUODENOSCOPY    . JOINT REPLACEMENT    . LAPAROSCOPIC SALPINGOOPHERECTOMY Bilateral ~ 1990  . TOTAL HIP ARTHROPLASTY Left 2007     Current Meds  Medication Sig  . albuterol (PROVENTIL) (2.5 MG/3ML) 0.083% nebulizer solution Take 3 mLs (2.5 mg total) by nebulization every 2 (two) hours as needed for wheezing or shortness of breath.  . ALPRAZolam (XANAX) 0.5 MG tablet Take 0.5 mg by mouth daily as needed for anxiety.   Marland Kitchen aspirin EC 81 MG tablet Take 81 mg by mouth daily.  . Cholecalciferol (VITAMIN D3) 2000 UNITS TABS Take 1 tablet by mouth daily.  . cyanocobalamin 500 MCG tablet Take 500 mcg by mouth daily.  Marland Kitchen diltiazem (CARDIZEM CD) 240 MG 24 hr capsule Take 240 mg by mouth daily.   Marland Kitchen EPIPEN 2-PAK 0.3 MG/0.3ML SOAJ injection Inject 0.3 mg into the skin daily as needed (allergic reaction).   Marland Kitchen estradiol (ESTRACE) 1 MG tablet Take 1 mg by mouth daily.   Marland Kitchen FLUoxetine (PROZAC) 10 MG tablet Take 10 mg by mouth daily.  . fluticasone (FLONASE) 50 MCG/ACT nasal spray  Place 2 sprays into the nose daily.  . furosemide (LASIX) 40 MG tablet Take 1 tablet (40 mg total) by mouth daily.  Marland Kitchen gabapentin (NEURONTIN) 100 MG capsule Take 100 mg by mouth daily.   . Glycerin-Polysorbate 80 (REFRESH DRY EYE THERAPY OP) Place 2 drops into both eyes as needed. Advance formulia   . isosorbide mononitrate (IMDUR) 30 MG 24 hr tablet Take 15 mg by mouth daily. TAKE HALF TABLET OF 30 MG BY MOUTH (15 MG TOTAL) ONCE DAILY  . metoprolol tartrate (LOPRESSOR) 25 MG tablet Take 0.5 tablets (12.5 mg total) by mouth 2 (two) times daily.  . montelukast (SINGULAIR) 10 MG tablet Take 10 mg by mouth at bedtime.  . Multiple Vitamin (MULTIVITAMIN WITH MINERALS) TABS tablet Take 1 tablet by mouth daily.  . mupirocin ointment (BACTROBAN) 2 % Apply 1 application topically daily as needed (skin irritation).   . nitroGLYCERIN (NITROSTAT) 0.4 MG SL tablet Place 1 tablet (0.4 mg total)  under the tongue every 5 (five) minutes as needed for chest pain.  . pantoprazole (PROTONIX) 40 MG tablet TAKE 1 TABLET BY MOUTH ONCE DAILY  . PROAIR HFA 108 (90 BASE) MCG/ACT inhaler Inhale 1-2 puffs into the lungs every 4 (four) hours as needed for wheezing.   . rosuvastatin (CRESTOR) 20 MG tablet Take 1 tablet (20 mg total) by mouth daily at 6 PM.  . SYMBICORT 160-4.5 MCG/ACT inhaler Inhale 2 puffs into the lungs 2 (two) times daily.  Marland Kitchen triamcinolone cream (KENALOG) 0.1 % Apply 1 application topically 2 (two) times daily as needed (skin irritation).     Allergies:   Levaquin [levofloxacin]; Codeine; Nitrofurantoin; Ciprofloxacin; Penicillins; and Sulfa antibiotics   Social History   Tobacco Use  . Smoking status: Never Smoker  . Smokeless tobacco: Never Used  Substance Use Topics  . Alcohol use: No    Alcohol/week: 0.0 standard drinks  . Drug use: No     Family Hx: The patient's family history includes Heart disease in her father; Hypertension in her mother; Ovarian cancer in her sister; Stroke in her  mother.  ROS:   Please see the history of present illness.    All other systems reviewed and are negative.   Labs/Other Tests and Data Reviewed:    Recent Labs: 07/11/2018: BUN 20; Creatinine, Ser 1.33; NT-Pro BNP 1,449; Potassium 5.0; Sodium 138   Recent Lipid Panel Lab Results  Component Value Date/Time   CHOL 132 08/09/2016 07:38 AM   TRIG 201 (H) 08/09/2016 07:38 AM   HDL 42 (L) 08/09/2016 07:38 AM   CHOLHDL 3.1 08/09/2016 07:38 AM   LDLCALC 50 08/09/2016 07:38 AM    Wt Readings from Last 3 Encounters:  01/31/19 220 lb 3.2 oz (99.9 kg)  07/11/18 239 lb 3.2 oz (108.5 kg)  06/11/18 232 lb (105.2 kg)     Objective:    Vital Signs:  BP 126/62 (BP Location: Left Arm, Patient Position: Sitting, Cuff Size: Normal)   Pulse 63   Ht 5' 5.5" (1.664 m)   Wt 220 lb 3.2 oz (99.9 kg)   SpO2 98%   BMI 36.09 kg/m    Well nourished, well developed female in no acute distress. Remainder of exam not performed, telemedicine visit.  ASSESSMENT & PLAN:    1.  Coronary artery disease, native vessel, without angina: medical program reviewed. No changes recommended.  2.  Chronic diastolic heart failure: Reviewed echo study above demonstrating preserved LV systolic function with Doppler parameters demonstrating diastolic dysfunction.  Patient's chronic dyspnea is likely multifactorial. Suspect weight loss has helped significantly.   3.  Hypertension: BP well-controlled on current Rx  4.  Mixed hyperlipidemia: treated with rosuvastatin. Labs followed by Dr Joylene Draft.  COVID-19 Education: The signs and symptoms of COVID-19 were discussed with the patient and how to seek care for testing (follow up with PCP or arrange E-visit).  The importance of social distancing was discussed today.  Patient Risk:   After full review of this patient's clinical status, I feel that they are at least moderate risk at this time.  Time:   Today, I have spent 17 minutes with the patient with telehealth  technology discussing issues above.     Medication Adjustments/Labs and Tests Ordered: Current medicines are reviewed at length with the patient today.  Concerns regarding medicines are outlined above.  Tests Ordered: No orders of the defined types were placed in this encounter.  Medication Changes: No orders of the defined types were placed  in this encounter.   Disposition:  Follow up in 6 month(s)  Signed, Sherren Mocha, MD  01/31/2019 9:34 AM    [place]

## 2019-02-01 ENCOUNTER — Encounter (HOSPITAL_COMMUNITY): Payer: Self-pay

## 2019-02-04 ENCOUNTER — Encounter (HOSPITAL_COMMUNITY): Payer: Self-pay

## 2019-02-06 ENCOUNTER — Encounter (HOSPITAL_COMMUNITY): Payer: Self-pay

## 2019-02-08 ENCOUNTER — Encounter (HOSPITAL_COMMUNITY): Payer: Self-pay

## 2019-02-11 ENCOUNTER — Encounter (HOSPITAL_COMMUNITY): Payer: Self-pay

## 2019-02-13 ENCOUNTER — Encounter (HOSPITAL_COMMUNITY): Payer: Self-pay

## 2019-02-15 ENCOUNTER — Encounter (HOSPITAL_COMMUNITY): Payer: Self-pay

## 2019-02-18 ENCOUNTER — Encounter (HOSPITAL_COMMUNITY): Payer: Self-pay

## 2019-02-20 ENCOUNTER — Encounter (HOSPITAL_COMMUNITY): Payer: Self-pay

## 2019-02-22 ENCOUNTER — Encounter (HOSPITAL_COMMUNITY): Payer: Self-pay

## 2019-02-25 ENCOUNTER — Encounter (HOSPITAL_COMMUNITY): Payer: Self-pay

## 2019-02-27 ENCOUNTER — Encounter (HOSPITAL_COMMUNITY): Payer: Self-pay

## 2019-05-17 ENCOUNTER — Telehealth: Payer: Self-pay | Admitting: Cardiovascular Disease

## 2019-05-17 DIAGNOSIS — R002 Palpitations: Secondary | ICD-10-CM

## 2019-05-17 NOTE — Telephone Encounter (Signed)
I spoke with pt. She has been under a great deal of stress caring for her mother this week.  Has had to take her mother to the ED twice this week. Pt states last night she felt irregular heart beat. Felt like several normal beats followed by a skipped beat.  Not consistently irregular.  Pt was able to relax and palpitations eased. Today she is not having any irregular heart beats. Pt reports increased caffeine intake recently.  I explained to her how this could contribute to palpitations and I asked her to try and limit caffeine intake. Pt also reports swelling in feet/ankles.  Started this week.  Does not weigh daily. No shortness of breath.  Swelling worsens as the day goes on. Improves when wearing compression stockings. She has been on her feet more than usual this week.  She is taking daily lasix dose. Is carefully watching salt intake.  I advised pt to weigh daily. I asked her to keep legs elevated when possible and wear compression stockings. Will send note to Dr. Burt Knack for review/recommendations.

## 2019-05-17 NOTE — Telephone Encounter (Signed)
I spoke with pt and gave her information from [employer] [place], [place] .  I told her monitor would be mailed to her.

## 2019-05-17 NOTE — Telephone Encounter (Signed)
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 Stress can certainly cause palpitations, but would recommend placing a heart monitor to rule out atrial fibrillation or other arrhyhtmia causing her symptoms. Please have her monitor her swelling and if it worsening she may need to be seen in the office for evaluation and labs.

## 2019-05-17 NOTE — Telephone Encounter (Signed)
What type of monitor should be ordered?

## 2019-05-17 NOTE — Telephone Encounter (Signed)
It doesn't have to be continuous telemetry. A two week Zio XT should be fine.

## 2019-05-17 NOTE — Telephone Encounter (Signed)
Per pt call she has been under a lot of stress lately and had and Irregular heart beat last night and would like to speak to  Some one about it please.    Patient c/o Palpitations:  High priority if patient c/o lightheadedness, shortness of breath, or chest pain  1) How long have you had palpitations/irregular HR/ Afib? Are you having the symptoms now? 1day  2) Are you currently experiencing lightheadedness, SOB or CP? No   3) Do you have a history of afib (atrial fibrillation) or irregular heart rhythm?  ?  4) Have you checked your BP or HR? (document readings if available): ?  5) Are you experiencing any other symptoms? NO

## 2019-05-24 ENCOUNTER — Telehealth: Payer: Self-pay | Admitting: Radiology

## 2019-05-24 ENCOUNTER — Telehealth: Payer: Self-pay | Admitting: Cardiovascular Disease

## 2019-05-24 NOTE — Telephone Encounter (Signed)
Spoke to patient and she has been enrolled for monitor

## 2019-05-24 NOTE — Telephone Encounter (Signed)
Enrolled patient for a 14 day Zio monitor to be mailed. Brief instructions were gone over with patient and she knows to expect the monitor to arrive in 3-4 days

## 2019-05-24 NOTE — Telephone Encounter (Signed)
° °  Patient spoke with the office last week, and she was told that she was going to receive a heart monitor in the mail.She has not received that heart monitor yet, and she just wanted to make sure that it didn't get lost in the mail.

## 2019-05-29 ENCOUNTER — Other Ambulatory Visit (INDEPENDENT_AMBULATORY_CARE_PROVIDER_SITE_OTHER): Payer: Medicare Other

## 2019-05-29 DIAGNOSIS — R002 Palpitations: Secondary | ICD-10-CM

## 2019-06-04 ENCOUNTER — Telehealth: Payer: Self-pay | Admitting: Cardiovascular Disease

## 2019-06-04 NOTE — Telephone Encounter (Signed)
New message   Patient states that the monitor is causing a rash and she took the zio monitor off and mailed back to company. Please call.

## 2019-06-05 NOTE — Telephone Encounter (Signed)
Spoke to patient she was only able to wear monitor for 3 days before she had an allergic reaction. She agrees lets she what the 3 days of data shows and if another monitor needs to be ordered we can order a Preventcie with sensitive skin patches.

## 2019-06-14 ENCOUNTER — Telehealth: Payer: Self-pay | Admitting: Cardiovascular Disease

## 2019-06-14 NOTE — Telephone Encounter (Signed)
LMTCB

## 2019-06-14 NOTE — Telephone Encounter (Signed)
Pt swelling: STAT is pt has developed SOB within 24 hours  1) How much weight have you gained and in what time span?  A little weight, but she does not know how much.  2) If swelling, where is the swelling located? Feet and knees on down in her legs  3) Are you currently taking a fluid pill? yes  4) Are you currently SOB? Yes- more than usual   5) Do you have a log of your daily weights (if so, list)? no  6) Have you gained 3 pounds in a day or 5 pounds in a week?  no  7) Have you traveled recently? went to [place], [place] yesterday     She also wants her Monitor results please.

## 2019-06-17 NOTE — Telephone Encounter (Signed)
Follow up  Patient is calling back per the previous message. She states that she did not get a message. Please call.

## 2019-06-17 NOTE — Telephone Encounter (Signed)
The patient reports gradual increased swelling in her feet, ankles and legs and slight SOB over the last few weeks. Her shoes still fit but they leave an indentation when they are removed. When she wakes up in the morning her swelling is almost all gone and gets worse throughout the day. She thinks she has gained a few pounds over time but is not sure it is all from swelling. She is taking Lasix 40 mg daily. She states she is limiting her salt and wears compression stockings at work. Reiterated to her the importance of elevating her legs as much as she can.  Scheduled her for evaluation this Thursday. She will take an extra half dose of lasix tomorrow. She will call prior to the visit if symptoms worsen. She was grateful for assistance.

## 2019-06-19 ENCOUNTER — Encounter: Payer: Self-pay | Admitting: Physician Assistant

## 2019-06-20 ENCOUNTER — Other Ambulatory Visit: Payer: Self-pay

## 2019-06-20 ENCOUNTER — Ambulatory Visit (INDEPENDENT_AMBULATORY_CARE_PROVIDER_SITE_OTHER): Payer: Medicare Other | Admitting: Physician Assistant

## 2019-06-20 ENCOUNTER — Encounter: Payer: Self-pay | Admitting: Physician Assistant

## 2019-06-20 VITALS — BP 120/74 | HR 64 | Ht 65.5 in | Wt 234.0 lb

## 2019-06-20 DIAGNOSIS — R600 Localized edema: Secondary | ICD-10-CM

## 2019-06-20 DIAGNOSIS — R002 Palpitations: Secondary | ICD-10-CM

## 2019-06-20 DIAGNOSIS — R0602 Shortness of breath: Secondary | ICD-10-CM

## 2019-06-20 DIAGNOSIS — Z79899 Other long term (current) drug therapy: Secondary | ICD-10-CM

## 2019-06-20 DIAGNOSIS — R609 Edema, unspecified: Secondary | ICD-10-CM

## 2019-06-20 DIAGNOSIS — I25119 Atherosclerotic heart disease of native coronary artery with unspecified angina pectoris: Secondary | ICD-10-CM

## 2019-06-20 DIAGNOSIS — N183 Chronic kidney disease, stage 3 unspecified: Secondary | ICD-10-CM

## 2019-06-20 DIAGNOSIS — I5033 Acute on chronic diastolic (congestive) heart failure: Secondary | ICD-10-CM

## 2019-06-20 DIAGNOSIS — I10 Essential (primary) hypertension: Secondary | ICD-10-CM

## 2019-06-20 MED ORDER — NITROGLYCERIN 0.4 MG SL SUBL: 0.4000 mg | SUBLINGUAL_TABLET | SUBLINGUAL | 99 refills | Status: DC | PRN

## 2019-06-20 NOTE — Patient Instructions (Signed)
Medication Instructions:  Your physician has recommended you make the following change in your medication:  1.  INCREASE the Lasix to 1 tablet in the morning and 1/2 tablet in the evening for 3 days.. After that, you continue to take 1 tablet in the morning, but only take 1/2 tablet in the evening only as needed  If you need a refill on your cardiac medications before your next appointment, please call your pharmacy.   Lab work: TODAY:  BMET & PRO BNP  If you have labs (blood work) drawn today and your tests are completely normal, you will receive your results only by: Marland Kitchen MyChart Message (if you have MyChart) OR . A paper copy in the mail If you have any lab test that is abnormal or we need to change your treatment, we will call you to review the results.  Testing/Procedures: None ordered  Follow-Up: At [facility], you and your health needs are our priority.  As part of our continuing mission to provide you with exceptional heart care, we have created designated Provider Care Teams.  These Care Teams include your primary Cardiologist (physician) and Advanced Practice Providers (APPs -  Physician Assistants and Nurse Practitioners) who all work together to provide you with the care you need, when you need it. You will need a follow up appointment in:  6 months.  Please call our office 2 months in advance to schedule this appointment.  You may see Sherren Mocha, MDor one of the following Advanced Practice Providers on your designated Care Team: Richardson Dopp, PA-C [place], [place] . Daune Perch, NP  Any Other Special Instructions Will Be Listed Below (If Applicable).

## 2019-06-20 NOTE — Addendum Note (Signed)
Addended by: Gaetano Net on: 06/20/2019 03:17 PM   Modules accepted: Orders

## 2019-06-20 NOTE — Progress Notes (Signed)
Cardiology Office Note    Date:  06/20/2019   ID:  Daveda, Larock 1946/01/06, MRN [id number]  PCP:  Crist Infante, MD  Cardiologist:  Dr. Burt Knack  Chief Complaint: LE swelling and SOB  History of Present Illness:   Donna Williamson is a 73 y.o. female CAD s/p DES to 1st Mrg, asthma, HTN, HLD, CKD III and chronic diastolic CHF added to schedule for LE edema and SOB.   Hx of NSTEMI 04/2016. Cath showed severe stenosis of the first OM branch of the circumflex s/p PCI using a Synergy drug-eluting stent. She was noted to have moderate residual disease in the LAD. The patient also was diagnosed with diastolic heart failure and a high filling pressures at cardiac catheterization.   He SOB on 07/2018. Stress test showed normal perfusion without ischemia. Echo showed LVEF of 55% and grade 1 DD.  Last seen by Dr. Burt Knack 01/2019 virtually. Felt dyspnea as multifactorial. Recommended weight loss. Recently worse monitor for palpitations which showed NSR with one episode of SVT x 10 beats. Only wear for 3 days due to reaction to adhesive. Rash is gone now. Pending final reading.  Added to my schedule for LE edema, SOB and weight gain.  She has gained 5 pounds in past 2 to 3 weeks.  However gained 15 pounds since last virtual visit with Dr. Burt Knack in April.  Not doing any exercise.  Eats food high in salt intake.  Compression stocking helps her edema.  No orthopnea, PND, chest pain, dizziness, syncope, melena or blood in her stool or urine.  Past Medical History:  Diagnosis Date  . Anemia   . Anxiety   . Arthritis    "feet, knees, back" (05/02/2016)  . Asthma   . CAD (coronary artery disease)    a. 04/2016: NSTEMI 95% stenosis 1st Mrg (s/p DES)  . Chest pain    a. 10/2013 Exercise Myoview: Ef 65%, no ischemia.  . Chronic bronchitis ([place])   . Chronic lower back pain   . GERD (gastroesophageal reflux disease)   . History of blood transfusion 1960s   "related to my hysterectomy"  .  Hypertension   . Mitral valve prolapse   . NSTEMI (non-ST elevated myocardial infarction) ([place]) 05/01/2016  . Pneumonia 05/01/2016   "saw trace of slight pneumonia/CT scan" (05/02/2016)  . PONV (postoperative nausea and vomiting)   . Seasonal allergies   . Sjogren's syndrome [facility])     Past Surgical History:  Procedure Laterality Date  . ABDOMINAL HYSTERECTOMY  1960s   "partial"  . CARDIAC CATHETERIZATION N/A 05/02/2016   Procedure: Right/Left Heart Cath and Coronary Angiography;  Surgeon: Jettie Booze, MD;  Location: [facility];  Service: Cardiovascular;  Laterality: N/A;  . CARDIAC CATHETERIZATION N/A 05/02/2016   Procedure: Coronary Stent Intervention;  Surgeon: Jettie Booze, MD;  Location: [facility];  Service: Cardiovascular;  Laterality: N/A;  . COLONOSCOPY W/ BIOPSIES AND POLYPECTOMY    . CORONARY ANGIOPLASTY WITH STENT PLACEMENT  05/02/2016   "1 stent"  . ESOPHAGOGASTRODUODENOSCOPY    . JOINT REPLACEMENT    . LAPAROSCOPIC SALPINGOOPHERECTOMY Bilateral ~ 1990  . TOTAL HIP ARTHROPLASTY Left 2007    Current Medications: Prior to Admission medications   Medication Sig Start Date End Date Taking? Authorizing Provider  albuterol (PROVENTIL) (2.5 MG/3ML) 0.083% nebulizer solution Take 3 mLs (2.5 mg total) by nebulization every 2 (two) hours as needed for wheezing or shortness of breath. 05/04/16   Karleen Hampshire,  Jeoffrey Massed, MD  ALPRAZolam Duanne Moron) 0.5 MG tablet Take 0.5 mg by mouth daily as needed for anxiety.     [provider]  aspirin EC 81 MG tablet Take 81 mg by mouth daily.    [provider]  Cholecalciferol (VITAMIN D3) 2000 UNITS TABS Take 1 tablet by mouth daily.    [provider]  cyanocobalamin 500 MCG tablet Take 500 mcg by mouth daily.    [provider]  diltiazem (CARDIZEM CD) 240 MG 24 hr capsule Take 240 mg by mouth daily.  11/27/13   [provider]  EPIPEN 2-PAK 0.3 MG/0.3ML SOAJ injection Inject 0.3 mg into the  skin daily as needed (allergic reaction).  11/01/13   [provider]  estradiol (ESTRACE) 1 MG tablet Take 1 mg by mouth daily.     [provider]  FLUoxetine (PROZAC) 10 MG tablet Take 10 mg by mouth daily.    [provider]  fluticasone (FLONASE) 50 MCG/ACT nasal spray Place 2 sprays into the nose daily.    [provider]  furosemide (LASIX) 40 MG tablet Take 1 tablet (40 mg total) by mouth daily. 07/11/18 07/06/19  Sherren Mocha, MD  gabapentin (NEURONTIN) 100 MG capsule Take 100 mg by mouth daily.     [provider]  Glycerin-Polysorbate 80 (REFRESH DRY EYE THERAPY OP) Place 2 drops into both eyes as needed. Advance formulia     [provider]  isosorbide mononitrate (IMDUR) 30 MG 24 hr tablet Take 15 mg by mouth daily. TAKE HALF TABLET OF 30 MG BY MOUTH (15 MG TOTAL) ONCE DAILY 07/31/16   [provider]  metoprolol tartrate (LOPRESSOR) 25 MG tablet Take 0.5 tablets (12.5 mg total) by mouth 2 (two) times daily. 04/21/17   Sherren Mocha, MD  montelukast (SINGULAIR) 10 MG tablet Take 10 mg by mouth at bedtime.    [provider]  Multiple Vitamin (MULTIVITAMIN WITH MINERALS) TABS tablet Take 1 tablet by mouth daily.    [provider]  mupirocin ointment (BACTROBAN) 2 % Apply 1 application topically daily as needed (skin irritation).  03/29/16   [provider]  nitroGLYCERIN (NITROSTAT) 0.4 MG SL tablet Place 1 tablet (0.4 mg total) under the tongue every 5 (five) minutes as needed for chest pain. 05/06/16   Dorothy Spark, MD  pantoprazole (PROTONIX) 40 MG tablet TAKE 1 TABLET BY MOUTH ONCE DAILY 07/24/17   Sherren Mocha, MD  [facility] HFA 108 (90 BASE) MCG/ACT inhaler Inhale 1-2 puffs into the lungs every 4 (four) hours as needed for wheezing.  11/01/13   [provider]  rosuvastatin (CRESTOR) 20 MG tablet Take 1 tablet (20 mg total) by mouth daily at 6 PM. 05/30/16   Daionna Crossland, [place], PA   SYMBICORT 160-4.5 MCG/ACT inhaler Inhale 2 puffs into the lungs 2 (two) times daily. 10/11/18   [provider]  triamcinolone cream (KENALOG) 0.1 % Apply 1 application topically 2 (two) times daily as needed (skin irritation).    [provider]    Allergies:   Levaquin [levofloxacin], Codeine, Nitrofurantoin, Ciprofloxacin, Penicillins, and Sulfa antibiotics   Social History   Socioeconomic History  . Marital status: Married    Spouse name: Not on file  . Number of children: Not on file  . Years of education: Not on file  . Highest education level: Not on file  Occupational History  . Not on file  Social Needs  . Financial resource strain: Not on file  .  Food insecurity    Worry: Not on file    Inability: Not on file  . Transportation needs    Medical: Not on file    Non-medical: Not on file  Tobacco Use  . Smoking status: Never Smoker  . Smokeless tobacco: Never Used  Substance and Sexual Activity  . Alcohol use: No    Alcohol/week: 0.0 standard drinks  . Drug use: No  . Sexual activity: Yes  Lifestyle  . Physical activity    Days per week: Not on file    Minutes per session: Not on file  . Stress: Not on file  Relationships  . Social Herbalist on phone: Not on file    Gets together: Not on file    Attends religious service: Not on file    Active member of club or organization: Not on file    Attends meetings of clubs or organizations: Not on file    Relationship status: Not on file  Other Topics Concern  . Not on file  Social History Narrative   Lives in [place] with husband.  Active but does not routinely exercise.     Family History:  The patient's family history includes Heart disease in her father; Hypertension in her mother; Ovarian cancer in her sister; Stroke in her mother.   ROS:   Please see the history of present illness.    ROS All other systems reviewed and are negative.   PHYSICAL EXAM:   VS:  BP 120/74   Pulse 64    Ht 5' 5.5" (1.664 m)   Wt 234 lb (106.1 kg)   SpO2 94%   BMI 38.35 kg/m    GEN: Well nourished, well developed, in no acute distress  HEENT: normal  Neck: no JVD, carotid bruits, or masses Cardiac: RRR; no murmurs, rubs, or gallops, trace bilateral lower extremity edema  Respiratory:  clear to auscultation bilaterally, normal work of breathing GI: soft, nontender, nondistended, + BS MS: no deformity or atrophy  Skin: warm and dry, no rash Neuro:  Alert and Oriented x 3, Strength and sensation are intact Psych: euthymic mood, full affect  Wt Readings from Last 3 Encounters:  06/20/19 234 lb (106.1 kg)  01/31/19 220 lb 3.2 oz (99.9 kg)  07/11/18 239 lb 3.2 oz (108.5 kg)      Studies/Labs Reviewed:   EKG:  EKG is ordered today.  The ekg ordered today demonstrates normal sinus rhythm  Recent Labs: 07/11/2018: BUN 20; Creatinine, Ser 1.33; NT-Pro BNP 1,449; Potassium 5.0; Sodium 138   Lipid Panel    Component Value Date/Time   CHOL 132 08/09/2016 0738   TRIG 201 (H) 08/09/2016 0738   HDL 42 (L) 08/09/2016 0738   CHOLHDL 3.1 08/09/2016 0738   VLDL 40 (H) 08/09/2016 0738   LDLCALC 50 08/09/2016 0738    Additional studies/ records that were reviewed today include:   As summarized  above    ASSESSMENT & PLAN:    1. Acute on chronic diastolic CHF -Volume overload by exam.  Likely due to excess salt intake.  At length discussion regarding the dietary restriction.  Check BNP.  Lasix changed as below.  2. CAD -No angina.  Continue aspirin, statin, Imdur and beta-blocker.  3. HTN -Blood pressure stable and well-controlled on current medication  4. HLD -Continue statin  5. Palpitations  monitor showed NSR with one episode of SVT x 10 beats. Pending final reading.  Only able to wore monitor for 3  days secondary to reaction to patch.  Her symptoms sounds like PVC.  Continue beta-blocker.  Check electrolytes today.  6.  Dyspnea on exertion -Likely due to obesity,  sedentary lifestyle and excess salt intake.  This is multifactorial.  Her symptom does not sounds like angina.  I have encouraged heart healthy diet with sodium restriction and regular exercise. -Stress test and echocardiogram was reassuring last year.  7. CKD III - Check labs  Medication Adjustments/Labs and Tests Ordered: Current medicines are reviewed at length with the patient today.  Concerns regarding medicines are outlined above.  Medication changes, Labs and Tests ordered today are listed in the Patient Instructions below. Patient Instructions  Medication Instructions:  Your physician has recommended you make the following change in your medication:  1.  INCREASE the Lasix to 1 tablet in the morning and 1/2 tablet in the evening for 3 days.. After that, you continue to take 1 tablet in the morning, but only take 1/2 tablet in the evening only as needed  If you need a refill on your cardiac medications before your next appointment, please call your pharmacy.   Lab work: TODAY:  BMET & PRO BNP  If you have labs (blood work) drawn today and your tests are completely normal, you will receive your results only by: Marland Kitchen MyChart Message (if you have MyChart) OR . A paper copy in the mail If you have any lab test that is abnormal or we need to change your treatment, we will call you to review the results.  Testing/Procedures: None ordered  Follow-Up: At [facility], you and your health needs are our priority.  As part of our continuing mission to provide you with exceptional heart care, we have created designated Provider Care Teams.  These Care Teams include your primary Cardiologist (physician) and Advanced Practice Providers (APPs -  Physician Assistants and Nurse Practitioners) who all work together to provide you with the care you need, when you need it. You will need a follow up appointment in:  6 months.  Please call our office 2 months in advance to schedule this appointment.  You  may see Sherren Mocha, MDor one of the following Advanced Practice Providers on your designated Care Team: Richardson Dopp, PA-C [place], [place] . Daune Perch, NP  Any Other Special Instructions Will Be Listed Below (If Applicable).       Jarrett Soho, [place]  06/20/2019 3:10 PM    [facility] [address], [place], [place]  [address] Phone: [phone]; Fax: [phone]

## 2019-06-21 ENCOUNTER — Telehealth: Payer: Self-pay

## 2019-06-21 LAB — BASIC METABOLIC PANEL
BUN/Creatinine Ratio: 16 (ref 12–28)
BUN: 21 mg/dL (ref 8–27)
CO2: 24 mmol/L (ref 20–29)
Calcium: 9.7 mg/dL (ref 8.7–10.3)
Chloride: 100 mmol/L (ref 96–106)
Creatinine, Ser: 1.34 mg/dL — ABNORMAL HIGH (ref 0.57–1.00)
GFR calc Af Amer: 45 mL/min/{1.73_m2} — ABNORMAL LOW (ref >59)
GFR calc non Af Amer: 39 mL/min/{1.73_m2} — ABNORMAL LOW (ref >59)
Glucose: 94 mg/dL (ref 65–99)
Potassium: 4.8 mmol/L (ref 3.5–5.2)
Sodium: 140 mmol/L (ref 134–144)

## 2019-06-21 LAB — PRO B NATRIURETIC PEPTIDE: NT-Pro BNP: 760 pg/mL — ABNORMAL HIGH (ref 0–738)

## 2019-06-21 NOTE — Telephone Encounter (Signed)
LMTCB

## 2019-06-21 NOTE — Telephone Encounter (Signed)
-----   Message from [place], [place] sent at 06/21/2019  9:24 AM EDT ----- Creatinine stable. Fluid marker minimally up. Recommendation as discussed yesterday.

## 2019-06-21 NOTE — Telephone Encounter (Signed)
Reviewed results with patient who verbalized understanding. 

## 2019-07-16 ENCOUNTER — Other Ambulatory Visit: Payer: Self-pay | Admitting: Cardiovascular Disease

## 2019-07-30 ENCOUNTER — Ambulatory Visit: Payer: Medicare Other | Admitting: Physician Assistant

## 2019-09-13 ENCOUNTER — Encounter: Payer: Self-pay | Admitting: *Deleted

## 2019-09-17 ENCOUNTER — Other Ambulatory Visit
Admission: RE | Admit: 2019-09-17 | Discharge: 2019-09-17 | Disposition: A | Discharge status: Home / Self Care | Payer: Medicare Other | Source: Ambulatory Visit | Attending: Ophthalmology | Admitting: Ophthalmology

## 2019-09-17 ENCOUNTER — Other Ambulatory Visit: Payer: Self-pay

## 2019-09-17 DIAGNOSIS — Z01812 Encounter for preprocedural laboratory examination: Secondary | ICD-10-CM | POA: Insufficient documentation

## 2019-09-17 DIAGNOSIS — Z20828 Contact with and (suspected) exposure to other viral communicable diseases: Secondary | ICD-10-CM | POA: Diagnosis not present

## 2019-09-17 LAB — SARS CORONAVIRUS 2 (TAT 6-24 HRS): SARS Coronavirus 2: NEGATIVE

## 2019-09-20 ENCOUNTER — Other Ambulatory Visit: Payer: Self-pay

## 2019-09-20 ENCOUNTER — Encounter
Admission: RE | Disposition: A | Discharge status: Home / Self Care | Payer: Self-pay | Source: Home / Self Care | Attending: Ophthalmology

## 2019-09-20 ENCOUNTER — Ambulatory Visit: Payer: Medicare Other | Admitting: Certified Registered"

## 2019-09-20 ENCOUNTER — Ambulatory Visit
Admission: RE | Admit: 2019-09-20 | Discharge: 2019-09-20 | Disposition: A | Discharge status: Home / Self Care | Payer: Medicare Other | Attending: Ophthalmology | Admitting: Ophthalmology

## 2019-09-20 ENCOUNTER — Encounter: Payer: Self-pay | Admitting: *Deleted

## 2019-09-20 DIAGNOSIS — E7800 Pure hypercholesterolemia, unspecified: Secondary | ICD-10-CM | POA: Diagnosis not present

## 2019-09-20 DIAGNOSIS — I509 Heart failure, unspecified: Secondary | ICD-10-CM | POA: Diagnosis not present

## 2019-09-20 DIAGNOSIS — I252 Old myocardial infarction: Secondary | ICD-10-CM | POA: Insufficient documentation

## 2019-09-20 DIAGNOSIS — Z881 Allergy status to other antibiotic agents status: Secondary | ICD-10-CM | POA: Insufficient documentation

## 2019-09-20 DIAGNOSIS — Z90710 Acquired absence of both cervix and uterus: Secondary | ICD-10-CM | POA: Insufficient documentation

## 2019-09-20 DIAGNOSIS — Z955 Presence of coronary angioplasty implant and graft: Secondary | ICD-10-CM | POA: Insufficient documentation

## 2019-09-20 DIAGNOSIS — Z880 Allergy status to penicillin: Secondary | ICD-10-CM | POA: Insufficient documentation

## 2019-09-20 DIAGNOSIS — F419 Anxiety disorder, unspecified: Secondary | ICD-10-CM | POA: Insufficient documentation

## 2019-09-20 DIAGNOSIS — I110 Hypertensive heart disease with heart failure: Secondary | ICD-10-CM | POA: Diagnosis not present

## 2019-09-20 DIAGNOSIS — J45909 Unspecified asthma, uncomplicated: Secondary | ICD-10-CM | POA: Insufficient documentation

## 2019-09-20 DIAGNOSIS — M3500 Sicca syndrome, unspecified: Secondary | ICD-10-CM | POA: Diagnosis not present

## 2019-09-20 DIAGNOSIS — M1990 Unspecified osteoarthritis, unspecified site: Secondary | ICD-10-CM | POA: Diagnosis not present

## 2019-09-20 DIAGNOSIS — I25119 Atherosclerotic heart disease of native coronary artery with unspecified angina pectoris: Secondary | ICD-10-CM | POA: Insufficient documentation

## 2019-09-20 DIAGNOSIS — M545 Low back pain: Secondary | ICD-10-CM | POA: Diagnosis not present

## 2019-09-20 DIAGNOSIS — G8929 Other chronic pain: Secondary | ICD-10-CM | POA: Diagnosis not present

## 2019-09-20 DIAGNOSIS — K219 Gastro-esophageal reflux disease without esophagitis: Secondary | ICD-10-CM | POA: Diagnosis not present

## 2019-09-20 DIAGNOSIS — I341 Nonrheumatic mitral (valve) prolapse: Secondary | ICD-10-CM | POA: Insufficient documentation

## 2019-09-20 DIAGNOSIS — Z96649 Presence of unspecified artificial hip joint: Secondary | ICD-10-CM | POA: Insufficient documentation

## 2019-09-20 DIAGNOSIS — Z882 Allergy status to sulfonamides status: Secondary | ICD-10-CM | POA: Insufficient documentation

## 2019-09-20 DIAGNOSIS — Z885 Allergy status to narcotic agent status: Secondary | ICD-10-CM | POA: Diagnosis not present

## 2019-09-20 DIAGNOSIS — H2511 Age-related nuclear cataract, right eye: Secondary | ICD-10-CM | POA: Insufficient documentation

## 2019-09-20 DIAGNOSIS — Z888 Allergy status to other drugs, medicaments and biological substances status: Secondary | ICD-10-CM | POA: Diagnosis not present

## 2019-09-20 DIAGNOSIS — F329 Major depressive disorder, single episode, unspecified: Secondary | ICD-10-CM | POA: Insufficient documentation

## 2019-09-20 HISTORY — DX: Chronic obstructive pulmonary disease, unspecified: J44.9

## 2019-09-20 HISTORY — DX: Localized edema: R60.0

## 2019-09-20 HISTORY — DX: Pure hypercholesterolemia, unspecified: E78.00

## 2019-09-20 HISTORY — DX: Wheezing: R06.2

## 2019-09-20 HISTORY — DX: Angina pectoris, unspecified: I20.9

## 2019-09-20 HISTORY — DX: Heart failure, unspecified: I50.9

## 2019-09-20 HISTORY — PX: CATARACT EXTRACTION W/PHACO: SHX586

## 2019-09-20 SURGERY — PHACOEMULSIFICATION, CATARACT, WITH IOL INSERTION
Anesthesia: Monitor Anesthesia Care | Site: Eye | Laterality: Right

## 2019-09-20 MED ORDER — ONDANSETRON HCL 4 MG/2ML IJ SOLN
INTRAMUSCULAR | Status: DC | PRN
  Administered 2019-09-20: 4 mg via INTRAVENOUS

## 2019-09-20 MED ORDER — POLYMYXIN B-TRIMETHOPRIM 10000-0.1 UNIT/ML-% OP SOLN: 1.0000 [drp] | Freq: Once | OPHTHALMIC | Status: DC

## 2019-09-20 MED ORDER — CARBACHOL 0.01 % IO SOLN
INTRAOCULAR | Status: DC | PRN
  Administered 2019-09-20: 0.5 mL via INTRAOCULAR

## 2019-09-20 MED ORDER — EPINEPHRINE PF 1 MG/ML IJ SOLN
INTRAOCULAR | Status: DC | PRN
  Administered 2019-09-20: 08:00:00 via OPHTHALMIC

## 2019-09-20 MED ORDER — TETRACAINE HCL 0.5 % OP SOLN
OPHTHALMIC | Status: AC
  Administered 2019-09-20: 1 [drp]
  Filled 2019-09-20: qty 4

## 2019-09-20 MED ORDER — POVIDONE-IODINE 5 % OP SOLN
OPHTHALMIC | Status: DC | PRN
  Administered 2019-09-20: 1 via OPHTHALMIC

## 2019-09-20 MED ORDER — ARMC OPHTHALMIC DILATING DROPS
1.0000 "application " | OPHTHALMIC | Status: AC
  Administered 2019-09-20 (×3): 1 via OPHTHALMIC

## 2019-09-20 MED ORDER — FENTANYL CITRATE (PF) 100 MCG/2ML IJ SOLN
INTRAMUSCULAR | Status: DC | PRN
  Administered 2019-09-20 (×2): 50 ug via INTRAVENOUS

## 2019-09-20 MED ORDER — CEFTAZIDIME SUBCONJUCTIVAL INJECTION 100 MG/0.5 ML
SUBCONJUNCTIVAL | Status: DC | PRN
  Administered 2019-09-20: 50 mg

## 2019-09-20 MED ORDER — POLYMYXIN B-TRIMETHOPRIM 10000-0.1 UNIT/ML-% OP SOLN
OPHTHALMIC | Status: DC | PRN
  Administered 2019-09-20: 1 [drp]

## 2019-09-20 MED ORDER — FENTANYL CITRATE (PF) 100 MCG/2ML IJ SOLN
INTRAMUSCULAR | Status: AC
  Filled 2019-09-20: qty 2

## 2019-09-20 MED ORDER — NA CHONDROIT SULF-NA HYALURON 40-17 MG/ML IO SOLN
INTRAOCULAR | Status: DC | PRN
  Administered 2019-09-20: 1 mL via INTRAOCULAR

## 2019-09-20 MED ORDER — TETRACAINE HCL 0.5 % OP SOLN: 1.0000 [drp] | Freq: Two times a day (BID) | OPHTHALMIC | Status: DC

## 2019-09-20 MED ORDER — ARMC OPHTHALMIC DILATING DROPS
OPHTHALMIC | Status: AC
  Administered 2019-09-20: 1 via OPHTHALMIC
  Filled 2019-09-20: qty 0.5

## 2019-09-20 MED ORDER — SODIUM CHLORIDE 0.9 % IV SOLN
INTRAVENOUS | Status: DC
  Administered 2019-09-20: 08:00:00 via INTRAVENOUS

## 2019-09-20 MED ORDER — MIDAZOLAM HCL 2 MG/2ML IJ SOLN
INTRAMUSCULAR | Status: DC | PRN
  Administered 2019-09-20 (×2): 1 mg via INTRAVENOUS

## 2019-09-20 MED ORDER — MIDAZOLAM HCL 2 MG/2ML IJ SOLN
INTRAMUSCULAR | Status: AC
  Filled 2019-09-20: qty 2

## 2019-09-20 MED ORDER — POLYMYXIN B-TRIMETHOPRIM 10000-0.1 UNIT/ML-% OP SOLN
OPHTHALMIC | Status: AC
  Filled 2019-09-20: qty 10

## 2019-09-20 MED ORDER — LIDOCAINE HCL (PF) 4 % IJ SOLN
INTRAOCULAR | Status: DC | PRN
  Administered 2019-09-20: 4 mL via OPHTHALMIC

## 2019-09-20 SURGICAL SUPPLY — 16 items
GLOVE BIO SURGEON STRL SZ8 (GLOVE) ×3 IMPLANT
GLOVE BIOGEL M 6.5 STRL (GLOVE) ×3 IMPLANT
GLOVE SURG LX 8.0 MICRO (GLOVE) ×2
GLOVE SURG LX STRL 8.0 MICRO (GLOVE) ×1 IMPLANT
GOWN STRL REUS W/ TWL LRG LVL3 (GOWN DISPOSABLE) ×2 IMPLANT
GOWN STRL REUS W/TWL LRG LVL3 (GOWN DISPOSABLE) ×4
LABEL CATARACT MEDS ST (LABEL) ×3 IMPLANT
LENS IOL TECNIS ITEC 14.5 (Intraocular Lens) ×2 IMPLANT
PACK CATARACT (MISCELLANEOUS) ×3 IMPLANT
PACK CATARACT BRASINGTON LX (MISCELLANEOUS) ×3 IMPLANT
PACK EYE AFTER SURG (MISCELLANEOUS) ×3 IMPLANT
SOL BSS BAG (MISCELLANEOUS) ×3
SOLUTION BSS BAG (MISCELLANEOUS) ×1 IMPLANT
SYR 5ML LL (SYRINGE) ×3 IMPLANT
WATER STERILE IRR 250ML POUR (IV SOLUTION) ×3 IMPLANT
WIPE NON LINTING 3.25X3.25 (MISCELLANEOUS) ×3 IMPLANT

## 2019-09-20 NOTE — Anesthesia Preprocedure Evaluation (Signed)
Anesthesia Evaluation  Patient identified by MRN, date of birth, ID band Patient awake    Reviewed: Allergy & Precautions, H&P , NPO status , Patient's Chart, lab work & pertinent test results, reviewed documented beta blocker date and time   History of Anesthesia Complications Negative for: history of anesthetic complications  Airway Mallampati: I  TM Distance: >3 FB Neck ROM: full    Dental  (+) Implants, Upper Dentures, Lower Dentures, Dental Advidsory Given, Edentulous Upper, Edentulous Lower   Pulmonary neg shortness of breath, asthma , neg sleep apnea, COPD, neg recent URI,    Pulmonary exam normal        Cardiovascular Exercise Tolerance: Good hypertension, + angina (stable, last episode a couple weeks ago) + CAD, + Past MI, + Cardiac Stents and +CHF  (-) CABG Normal cardiovascular exam(-) dysrhythmias (-) Valvular Problems/Murmurs     Neuro/Psych PSYCHIATRIC DISORDERS Anxiety Depression negative neurological ROS     GI/Hepatic Neg liver ROS, GERD  ,  Endo/Other  negative endocrine ROS  Renal/GU negative Renal ROS  negative genitourinary   Musculoskeletal   Abdominal   Peds  Hematology negative hematology ROS (+)   Anesthesia Other Findings Past Medical History: No date: Anemia No date: Anginal pain ([place]) No date: Anxiety No date: Arthritis     Comment:  "feet, knees, back" (05/02/2016) No date: Asthma No date: CAD (coronary artery disease)     Comment:  a. 04/2016: NSTEMI 95% stenosis 1st Mrg (s/p DES) No date: Chest pain     Comment:  a. 10/2013 Exercise Myoview: Ef 65%, no ischemia. No date: CHF (congestive heart failure) ([facility]) No date: Chronic bronchitis ([facility]) No date: Chronic lower back pain No date: COPD (chronic obstructive pulmonary disease) ([facility]) No date: GERD (gastroesophageal reflux disease) 1960s: History of blood transfusion     Comment:  "related to my hysterectomy" No date:  Hypercholesterolemia No date: Hypertension No date: Lower extremity edema No date: Mitral valve prolapse 05/01/2016: NSTEMI (non-ST elevated myocardial infarction) ([place]) 05/01/2016: Pneumonia     Comment:  "saw trace of slight pneumonia/CT scan" (05/02/2016) No date: Seasonal allergies No date: Sjogren's syndrome ([facility]) No date: Wheezing   Reproductive/Obstetrics negative OB ROS                             Anesthesia Physical Anesthesia Plan  ASA: III  Anesthesia Plan: MAC   Post-op Pain Management:    Induction: Intravenous  PONV Risk Score and Plan:   Airway Management Planned: Natural Airway and Nasal Cannula  Additional Equipment:   Intra-op Plan:   Post-operative Plan:   Informed Consent: I have reviewed the patients History and Physical, chart, labs and discussed the procedure including the risks, benefits and alternatives for the proposed anesthesia with the patient or authorized representative who has indicated his/her understanding and acceptance.     Dental Advisory Given  Plan Discussed with: Anesthesiologist, CRNA and Surgeon  Anesthesia Plan Comments:         Anesthesia Quick Evaluation

## 2019-09-20 NOTE — Transfer of Care (Signed)
Immediate Anesthesia Transfer of Care Note  Patient: Donna Williamson  Procedure(s) Performed: CATARACT EXTRACTION PHACO AND INTRAOCULAR LENS PLACEMENT (IOC) RIGHT (Right Eye)  Patient Location: PACU  Anesthesia Type:MAC  Level of Consciousness: awake, alert  and oriented  Airway & Oxygen Therapy: Patient Spontanous Breathing and Patient connected to nasal cannula oxygen  Post-op Assessment: Report given to RN and Post -op Vital signs reviewed and stable  Post vital signs: Reviewed and stable  Last Vitals:  Vitals Value Taken Time  BP    Temp    Pulse    Resp    SpO2      Last Pain:  Vitals:   09/20/19 0648  TempSrc: Tympanic  PainSc: 0-No pain         Complications: No apparent anesthesia complications

## 2019-09-20 NOTE — Discharge Instructions (Signed)
Eye Surgery Discharge Instructions    Expect mild scratchy sensation or mild soreness. DO NOT RUB YOUR EYE!  The day of surgery:  Minimal physical activity, but bed rest is not required  No reading, computer work, or close hand work  No bending, lifting, or straining.  May watch TV  For 24 hours:  No driving, legal decisions, or alcoholic beverages  Safety precautions  Eat anything you prefer: It is better to start with liquids, then soup then solid foods.  _____ Eye patch should be worn until postoperative exam tomorrow.  ____ Solar shield eyeglasses should be worn for comfort in the sunlight/patch while sleeping  Resume all regular medications including aspirin or Coumadin if these were discontinued prior to surgery. You may shower, bathe, shave, or wash your hair. Tylenol may be taken for mild discomfort.  Call your doctor if you experience significant pain, nausea, or vomiting, fever > 101 or other signs of infection. [phone] or [phone] Specific instructions:  Follow-up Information    Birder Robson, MD Follow up.   Specialty: Ophthalmology Contact information: [address] [place] [address] [phone]         AMBULATORY SURGERY  DISCHARGE INSTRUCTIONS   1) The drugs that you were given will stay in your system until tomorrow so for the next 24 hours you should not:  A) Drive an automobile B) Make any legal decisions C) Drink any alcoholic beverage   2) You may resume regular meals tomorrow.  Today it is better to start with liquids and gradually work up to solid foods.  You may eat anything you prefer, but it is better to start with liquids, then soup and crackers, and gradually work up to solid foods.   3) Please notify your doctor immediately if you have any unusual bleeding, trouble breathing, redness and pain at the surgery site, drainage, fever, or pain not relieved by medication.    4) Additional  Instructions:        Please contact your physician with any problems or Same Day Surgery at [phone], Monday through Friday 6 am to 4 pm, or [place] at [facility] number at [phone].

## 2019-09-20 NOTE — Op Note (Signed)
PREOPERATIVE DIAGNOSIS:  Nuclear sclerotic cataract of the right eye.   POSTOPERATIVE DIAGNOSIS: NUCLEAR SCLEROTIC CATARACT RIGHT EYE   OPERATIVE PROCEDURE:  Procedure(s): CATARACT EXTRACTION PHACO AND INTRAOCULAR LENS PLACEMENT ([place]) RIGHT   SURGEON:  Birder Robson, MD.   ANESTHESIA:  Anesthesiologist: Martha Clan, MD CRNA: Disser, Einar Grad, CRNA  1.      Managed anesthesia care. 2.      Topical tetracaine drops followed by 2% Xylocaine jelly applied in the preoperative holding area.   COMPLICATIONS:  None.   TECHNIQUE:   Stop and chop   DESCRIPTION OF PROCEDURE:  The patient was examined and consented in the preoperative holding area where the aforementioned topical anesthesia was applied to the right eye and then brought back to the Operating Room where the right eye was prepped and draped in the usual sterile ophthalmic fashion and a lid speculum was placed. A paracentesis was created with the side port blade and the anterior chamber was filled with viscoelastic. A near clear corneal incision was performed with the steel keratome. A continuous curvilinear capsulorrhexis was performed with a cystotome followed by the capsulorrhexis forceps. Hydrodissection and hydrodelineation were carried out with BSS on a blunt cannula. The lens was removed in a stop and chop  technique and the remaining cortical material was removed with the irrigation-aspiration handpiece. The capsular bag was inflated with viscoelastic and the Technis [id number]  lens was placed in the capsular bag without complication. The remaining viscoelastic was removed from the eye with the irrigation-aspiration handpiece. The wounds were hydrated. The anterior chamber was flushed with Miostat and the eye was inflated to physiologic pressure. 0.1 mL of cefuroxime concentration 10 mg/mL was placed in the anterior chamber. The wounds were found to be water tight. The eye was dressed with Polytrim. The patient was given protective  glasses to wear throughout the day and a shield with which to sleep tonight. The patient was also given drops with which to begin a drop regimen today and will follow-up with me in one day. Implant Name Type Inv. Item Serial No. Manufacturer Lot No. LRB No. Used Action  LENS IOL DIOP 14.5 - [id number] Intraocular Lens LENS IOL DIOP 14.5 [id number] AMO  Right 1 Implanted   Procedure(s) with comments: CATARACT EXTRACTION PHACO AND INTRAOCULAR LENS PLACEMENT (IOC) RIGHT (Right) - [place] 01:29.4 CDE 19.28 FLUID PACK LOT # [id number]  Electronically signed: Birder Robson 09/20/2019 8:19 AM

## 2019-09-20 NOTE — Anesthesia Post-op Follow-up Note (Signed)
Anesthesia QCDR form completed.        

## 2019-09-20 NOTE — H&P (Signed)
All labs reviewed. Abnormal studies sent to patients PCP when indicated.  Previous H&P reviewed, patient examined, there are NO CHANGES.  [name]11/20/20207:17 AM

## 2019-09-20 NOTE — Anesthesia Postprocedure Evaluation (Signed)
Anesthesia Post Note  Patient: Donna Williamson  Procedure(s) Performed: CATARACT EXTRACTION PHACO AND INTRAOCULAR LENS PLACEMENT (IOC) RIGHT (Right Eye)  Patient location during evaluation: PACU Anesthesia Type: MAC Level of consciousness: awake and alert Pain management: pain level controlled Vital Signs Assessment: post-procedure vital signs reviewed and stable Respiratory status: spontaneous breathing, nonlabored ventilation, respiratory function stable and patient connected to nasal cannula oxygen Cardiovascular status: stable and blood pressure returned to baseline Postop Assessment: no apparent nausea or vomiting Anesthetic complications: no     Last Vitals:  Vitals:   09/20/19 0648 09/20/19 0824  BP: (!) 150/64 (!) 146/56  Pulse: 70 60  Resp: 16 14  Temp: 37.3 C (!) 36.3 C  SpO2:  96%    Last Pain:  Vitals:   09/20/19 0824  TempSrc: Temporal  PainSc: 0-No pain                 Martha Clan

## 2019-10-08 NOTE — Anesthesia Preprocedure Evaluation (Addendum)
Anesthesia Evaluation  Patient identified by MRN, date of birth, ID band Patient awake    Reviewed: Allergy & Precautions, NPO status   Airway Mallampati: II  TM Distance: >3 FB     Dental   Pulmonary asthma , COPD,    breath sounds clear to auscultation       Cardiovascular hypertension, + CAD (s/p NSTEMI 2017, stents) and +CHF  + Valvular Problems/Murmurs MVP  Rhythm:Regular Rate:Normal     Neuro/Psych PSYCHIATRIC DISORDERS Anxiety Depression Chronic back pain    GI/Hepatic GERD  ,  Endo/Other  Obesity   Renal/GU Renal disease (stage III CKD)     Musculoskeletal  (+) Arthritis , Sjogren syndrome   Abdominal   Peds  Hematology  (+) Blood dyscrasia, anemia ,   Anesthesia Other Findings Cardiology note 06/20/19:  1. Acute on chronic diastolic CHF -Volume overload by exam.  Likely due to excess salt intake.  At length discussion regarding the dietary restriction.  Check BNP.  Lasix changed as below.  2. CAD -No angina.  Continue aspirin, statin, Imdur and beta-blocker.  3. HTN -Blood pressure stable and well-controlled on current medication  4. HLD -Continue statin  5. Palpitations  monitor showed NSR with one episode of SVT x 10 beats. Pending final reading.  Only able to wore monitor for 3 days secondary to reaction to patch.  Her symptoms sounds like PVC.  Continue beta-blocker.  Check electrolytes today.  6.  Dyspnea on exertion -Likely due to obesity, sedentary lifestyle and excess salt intake.  This is multifactorial.  Her symptom does not sounds like angina.  I have encouraged heart healthy diet with sodium restriction and regular exercise. -Stress test and echocardiogram was reassuring last year.  7. CKD III - Check labs  Reproductive/Obstetrics                            Anesthesia Physical Anesthesia Plan  ASA: III  Anesthesia Plan: MAC   Post-op Pain Management:     Induction:   PONV Risk Score and Plan: 2 and Treatment may vary due to age or medical condition and Midazolam  Airway Management Planned:   Additional Equipment:   Intra-op Plan:   Post-operative Plan:   Informed Consent: I have reviewed the patients History and Physical, chart, labs and discussed the procedure including the risks, benefits and alternatives for the proposed anesthesia with the patient or authorized representative who has indicated his/her understanding and acceptance.       Plan Discussed with: CRNA  Anesthesia Plan Comments:         Anesthesia Quick Evaluation

## 2019-10-11 ENCOUNTER — Other Ambulatory Visit
Admission: RE | Admit: 2019-10-11 | Discharge: 2019-10-11 | Disposition: A | Discharge status: Home / Self Care | Payer: Medicare Other | Source: Ambulatory Visit | Attending: Ophthalmology | Admitting: Ophthalmology

## 2019-10-11 DIAGNOSIS — Z01812 Encounter for preprocedural laboratory examination: Secondary | ICD-10-CM | POA: Diagnosis present

## 2019-10-11 DIAGNOSIS — Z20828 Contact with and (suspected) exposure to other viral communicable diseases: Secondary | ICD-10-CM | POA: Diagnosis not present

## 2019-10-11 LAB — SARS CORONAVIRUS 2 (TAT 6-24 HRS): SARS Coronavirus 2: NEGATIVE

## 2019-10-14 NOTE — Discharge Instructions (Signed)

## 2019-10-15 ENCOUNTER — Encounter: Payer: Self-pay | Admitting: Ophthalmology

## 2019-10-15 ENCOUNTER — Ambulatory Visit: Payer: Medicare Other | Admitting: Anesthesiology

## 2019-10-15 ENCOUNTER — Other Ambulatory Visit: Payer: Self-pay

## 2019-10-15 ENCOUNTER — Ambulatory Visit
Admission: RE | Admit: 2019-10-15 | Discharge: 2019-10-15 | Disposition: A | Discharge status: Home / Self Care | Payer: Medicare Other | Attending: Ophthalmology | Admitting: Ophthalmology

## 2019-10-15 ENCOUNTER — Encounter
Admission: RE | Disposition: A | Discharge status: Home / Self Care | Payer: Self-pay | Source: Home / Self Care | Attending: Ophthalmology

## 2019-10-15 DIAGNOSIS — Z881 Allergy status to other antibiotic agents status: Secondary | ICD-10-CM | POA: Insufficient documentation

## 2019-10-15 DIAGNOSIS — I252 Old myocardial infarction: Secondary | ICD-10-CM | POA: Diagnosis not present

## 2019-10-15 DIAGNOSIS — N183 Chronic kidney disease, stage 3 unspecified: Secondary | ICD-10-CM | POA: Insufficient documentation

## 2019-10-15 DIAGNOSIS — J449 Chronic obstructive pulmonary disease, unspecified: Secondary | ICD-10-CM | POA: Diagnosis not present

## 2019-10-15 DIAGNOSIS — Z96649 Presence of unspecified artificial hip joint: Secondary | ICD-10-CM | POA: Diagnosis not present

## 2019-10-15 DIAGNOSIS — H2512 Age-related nuclear cataract, left eye: Secondary | ICD-10-CM | POA: Insufficient documentation

## 2019-10-15 DIAGNOSIS — Z7982 Long term (current) use of aspirin: Secondary | ICD-10-CM | POA: Diagnosis not present

## 2019-10-15 DIAGNOSIS — Z880 Allergy status to penicillin: Secondary | ICD-10-CM | POA: Insufficient documentation

## 2019-10-15 DIAGNOSIS — M3500 Sicca syndrome, unspecified: Secondary | ICD-10-CM | POA: Insufficient documentation

## 2019-10-15 DIAGNOSIS — E669 Obesity, unspecified: Secondary | ICD-10-CM | POA: Insufficient documentation

## 2019-10-15 DIAGNOSIS — M1990 Unspecified osteoarthritis, unspecified site: Secondary | ICD-10-CM | POA: Insufficient documentation

## 2019-10-15 DIAGNOSIS — Z6838 Body mass index (BMI) 38.0-38.9, adult: Secondary | ICD-10-CM | POA: Diagnosis not present

## 2019-10-15 DIAGNOSIS — Z888 Allergy status to other drugs, medicaments and biological substances status: Secondary | ICD-10-CM | POA: Insufficient documentation

## 2019-10-15 DIAGNOSIS — I2510 Atherosclerotic heart disease of native coronary artery without angina pectoris: Secondary | ICD-10-CM | POA: Insufficient documentation

## 2019-10-15 DIAGNOSIS — I5032 Chronic diastolic (congestive) heart failure: Secondary | ICD-10-CM | POA: Insufficient documentation

## 2019-10-15 DIAGNOSIS — Z79899 Other long term (current) drug therapy: Secondary | ICD-10-CM | POA: Diagnosis not present

## 2019-10-15 DIAGNOSIS — Z882 Allergy status to sulfonamides status: Secondary | ICD-10-CM | POA: Insufficient documentation

## 2019-10-15 DIAGNOSIS — Z7951 Long term (current) use of inhaled steroids: Secondary | ICD-10-CM | POA: Diagnosis not present

## 2019-10-15 DIAGNOSIS — I130 Hypertensive heart and chronic kidney disease with heart failure and stage 1 through stage 4 chronic kidney disease, or unspecified chronic kidney disease: Secondary | ICD-10-CM | POA: Diagnosis not present

## 2019-10-15 DIAGNOSIS — K219 Gastro-esophageal reflux disease without esophagitis: Secondary | ICD-10-CM | POA: Insufficient documentation

## 2019-10-15 DIAGNOSIS — Z885 Allergy status to narcotic agent status: Secondary | ICD-10-CM | POA: Diagnosis not present

## 2019-10-15 HISTORY — PX: CATARACT EXTRACTION W/PHACO: SHX586

## 2019-10-15 SURGERY — PHACOEMULSIFICATION, CATARACT, WITH IOL INSERTION
Anesthesia: Monitor Anesthesia Care | Site: Eye | Laterality: Left

## 2019-10-15 MED ORDER — ONDANSETRON HCL 4 MG/2ML IJ SOLN: 4.0000 mg | Freq: Once | INTRAMUSCULAR | Status: DC | PRN

## 2019-10-15 MED ORDER — BRIMONIDINE TARTRATE-TIMOLOL 0.2-0.5 % OP SOLN
OPHTHALMIC | Status: DC | PRN
  Administered 2019-10-15: 1 [drp] via OPHTHALMIC

## 2019-10-15 MED ORDER — NA CHONDROIT SULF-NA HYALURON 40-17 MG/ML IO SOLN
INTRAOCULAR | Status: DC | PRN
  Administered 2019-10-15: 1 mL via INTRAOCULAR

## 2019-10-15 MED ORDER — POLYMYXIN B-TRIMETHOPRIM 10000-0.1 UNIT/ML-% OP SOLN
OPHTHALMIC | Status: DC | PRN
  Administered 2019-10-15: 1 [drp] via OPHTHALMIC

## 2019-10-15 MED ORDER — TETRACAINE HCL 0.5 % OP SOLN
1.0000 [drp] | OPHTHALMIC | Status: DC | PRN
  Administered 2019-10-15 (×3): 1 [drp] via OPHTHALMIC

## 2019-10-15 MED ORDER — LIDOCAINE HCL (PF) 2 % IJ SOLN
INTRAOCULAR | Status: DC | PRN
  Administered 2019-10-15: 2 mL

## 2019-10-15 MED ORDER — EPINEPHRINE PF 1 MG/ML IJ SOLN
INTRAOCULAR | Status: DC | PRN
  Administered 2019-10-15: 42 mL via OPHTHALMIC

## 2019-10-15 MED ORDER — FENTANYL CITRATE (PF) 100 MCG/2ML IJ SOLN
INTRAMUSCULAR | Status: DC | PRN
  Administered 2019-10-15 (×2): 50 ug via INTRAVENOUS

## 2019-10-15 MED ORDER — LACTATED RINGERS IV SOLN: 100.0000 mL/h | INTRAVENOUS | Status: DC

## 2019-10-15 MED ORDER — ARMC OPHTHALMIC DILATING DROPS
1.0000 "application " | OPHTHALMIC | Status: DC | PRN
  Administered 2019-10-15 (×3): 1 via OPHTHALMIC

## 2019-10-15 MED ORDER — MIDAZOLAM HCL 2 MG/2ML IJ SOLN
INTRAMUSCULAR | Status: DC | PRN
  Administered 2019-10-15 (×2): 1 mg via INTRAVENOUS

## 2019-10-15 MED ORDER — LACTATED RINGERS IV SOLN: INTRAVENOUS | Status: DC

## 2019-10-15 MED ORDER — CEFUROXIME OPHTHALMIC INJECTION 1 MG/0.1 ML
INJECTION | OPHTHALMIC | Status: DC | PRN
  Administered 2019-10-15: 0.1 mL via INTRACAMERAL

## 2019-10-15 SURGICAL SUPPLY — 20 items
CANNULA ANT/CHMB 27G (MISCELLANEOUS) ×2 IMPLANT
CANNULA ANT/CHMB 27GA (MISCELLANEOUS) ×6 IMPLANT
GLOVE SURG LX 8.0 MICRO (GLOVE) ×2
GLOVE SURG LX STRL 8.0 MICRO (GLOVE) ×1 IMPLANT
GLOVE SURG TRIUMPH 8.0 PF LTX (GLOVE) ×3 IMPLANT
GOWN STRL REUS W/ TWL LRG LVL3 (GOWN DISPOSABLE) ×2 IMPLANT
GOWN STRL REUS W/TWL LRG LVL3 (GOWN DISPOSABLE) ×4
LENS IOL TECNIS ITEC 14.0 (Intraocular Lens) ×2 IMPLANT
MARKER SKIN DUAL TIP RULER LAB (MISCELLANEOUS) ×3 IMPLANT
NDL FILTER BLUNT 18X1 1/2 (NEEDLE) ×1 IMPLANT
NDL RETROBULBAR .5 NSTRL (NEEDLE) ×3 IMPLANT
NEEDLE FILTER BLUNT 18X 1/2SAF (NEEDLE) ×2
NEEDLE FILTER BLUNT 18X1 1/2 (NEEDLE) ×1 IMPLANT
PACK EYE AFTER SURG (MISCELLANEOUS) ×3 IMPLANT
PACK OPTHALMIC (MISCELLANEOUS) ×3 IMPLANT
PACK PORFILIO (MISCELLANEOUS) ×3 IMPLANT
SYR 3ML LL SCALE MARK (SYRINGE) ×3 IMPLANT
SYR TB 1ML LUER SLIP (SYRINGE) ×3 IMPLANT
WATER STERILE IRR 250ML POUR (IV SOLUTION) ×3 IMPLANT
WIPE NON LINTING 3.25X3.25 (MISCELLANEOUS) ×3 IMPLANT

## 2019-10-15 NOTE — Transfer of Care (Signed)
Immediate Anesthesia Transfer of Care Note  Patient: Donna Williamson  Procedure(s) Performed: CATARACT EXTRACTION PHACO AND INTRAOCULAR LENS PLACEMENT (IOC) LEFT 8.76, 00:48.8 (Left Eye)  Patient Location: PACU  Anesthesia Type: MAC  Level of Consciousness: awake, alert  and patient cooperative  Airway and Oxygen Therapy: Patient Spontanous Breathing and Patient connected to supplemental oxygen  Post-op Assessment: Post-op Vital signs reviewed, Patient's Cardiovascular Status Stable, Respiratory Function Stable, Patent Airway and No signs of Nausea or vomiting  Post-op Vital Signs: Reviewed and stable  Complications: No apparent anesthesia complications

## 2019-10-15 NOTE — Anesthesia Procedure Notes (Signed)
Procedure Name: MAC Performed by: Vanetta Shawl, CRNA Pre-anesthesia Checklist: Patient identified, Emergency Drugs available, Suction available, Timeout performed and Patient being monitored Patient Re-evaluated:Patient Re-evaluated prior to induction Oxygen Delivery Method: Nasal cannula Placement Confirmation: positive ETCO2

## 2019-10-15 NOTE — H&P (Signed)
All labs reviewed. Abnormal studies sent to patients PCP when indicated.  Previous H&P reviewed, patient examined, there are NO CHANGES.  Donna Williamson Porfilio12/15/20209:15 AM

## 2019-10-15 NOTE — Anesthesia Postprocedure Evaluation (Signed)
Anesthesia Post Note  Patient: Donna Williamson  Procedure(s) Performed: CATARACT EXTRACTION PHACO AND INTRAOCULAR LENS PLACEMENT (IOC) LEFT 8.76, 00:48.8 (Left Eye)     Patient location during evaluation: PACU Anesthesia Type: MAC Level of consciousness: awake Pain management: pain level controlled Vital Signs Assessment: post-procedure vital signs reviewed and stable Respiratory status: respiratory function stable Cardiovascular status: stable Postop Assessment: no apparent nausea or vomiting Anesthetic complications: no    Veda Canning

## 2019-10-15 NOTE — Op Note (Signed)
PREOPERATIVE DIAGNOSIS:  Nuclear sclerotic cataract of the left eye.   POSTOPERATIVE DIAGNOSIS:  H25.12 Cataract   OPERATIVE PROCEDURE:  Procedure(s): CATARACT EXTRACTION PHACO AND INTRAOCULAR LENS PLACEMENT (IOC) LEFT 8.76, 00:48.8   SURGEON:  Birder Robson, MD.   ANESTHESIA:   Anesthesiologist: Veda Canning, MD CRNA: Vanetta Shawl, CRNA  1.      Managed anesthesia care. 2.      Topical tetracaine drops followed by 2% Xylocaine jelly applied in the preoperative holding area.   COMPLICATIONS:  None.   TECHNIQUE:   Stop and chop   DESCRIPTION OF PROCEDURE:  The patient was examined and consented in the preoperative holding area where the aforementioned topical anesthesia was applied to the left eye and then brought back to the Operating Room where the left eye was prepped and draped in the usual sterile ophthalmic fashion and a lid speculum was placed. A paracentesis was created with the side port blade and the anterior chamber was filled with viscoelastic. A near clear corneal incision was performed with the steel keratome. A continuous curvilinear capsulorrhexis was performed with a cystotome followed by the capsulorrhexis forceps. Hydrodissection and hydrodelineation were carried out with BSS on a blunt cannula. The lens was removed in a stop and chop  technique and the remaining cortical material was removed with the irrigation-aspiration handpiece. The capsular bag was inflated with viscoelastic and the Technis [id number] lens was placed in the capsular bag without complication. The remaining viscoelastic was removed from the eye with the irrigation-aspiration handpiece. The wounds were hydrated. The anterior chamber was flushed with BSS and the eye was inflated to physiologic pressure. 0.1 mL of cefuroxime concentration 10 mg/mL was placed in the anterior chamber. The wounds were found to be water tight. The eye was dressed with Polytrim. The patient was given protective glasses to wear  throughout the day and a shield with which to sleep tonight. The patient was also given drops with which to begin a drop regimen today and will follow-up with me in one day. Implant Name Type Inv. Item Serial No. Manufacturer Lot No. LRB No. Used Action  LENS IOL DIOP 14.0 - [id number] Intraocular Lens LENS IOL DIOP 14.0 [id number]  Left 1 Implanted   Procedure(s): CATARACT EXTRACTION PHACO AND INTRAOCULAR LENS PLACEMENT (IOC) LEFT 8.76, 00:48.8 (Left)  Electronically signed: Birder Robson 10/15/2019 9:39 AM

## 2019-10-16 ENCOUNTER — Encounter: Payer: Self-pay | Admitting: *Deleted

## 2020-01-10 ENCOUNTER — Other Ambulatory Visit: Payer: Self-pay

## 2020-01-10 ENCOUNTER — Encounter: Payer: Self-pay | Admitting: Cardiovascular Disease

## 2020-01-10 ENCOUNTER — Ambulatory Visit: Payer: Medicare PPO | Admitting: Cardiovascular Disease

## 2020-01-10 VITALS — BP 110/68 | HR 80 | Ht 65.0 in | Wt 228.4 lb

## 2020-01-10 DIAGNOSIS — I25119 Atherosclerotic heart disease of native coronary artery with unspecified angina pectoris: Secondary | ICD-10-CM

## 2020-01-10 DIAGNOSIS — I10 Essential (primary) hypertension: Secondary | ICD-10-CM | POA: Diagnosis not present

## 2020-01-10 DIAGNOSIS — N1831 Chronic kidney disease, stage 3a: Secondary | ICD-10-CM | POA: Diagnosis not present

## 2020-01-10 DIAGNOSIS — I5032 Chronic diastolic (congestive) heart failure: Secondary | ICD-10-CM

## 2020-01-10 DIAGNOSIS — E782 Mixed hyperlipidemia: Secondary | ICD-10-CM

## 2020-01-10 MED ORDER — ISOSORBIDE MONONITRATE ER 30 MG PO TB24: 30.0000 mg | ORAL_TABLET | Freq: Every day | ORAL | 3 refills | Status: DC

## 2020-01-10 NOTE — Patient Instructions (Signed)
Medication Instructions:  1) INCREASE IMDUR to 30 mg daily *If you need a refill on your cardiac medications before your next appointment, please call your pharmacy*  Testing/Procedures: Your provider has requested that you have a lexiscan myoview. For further information please visit [email or web address]. Please follow instruction sheet, as given.  Follow-Up: At [facility], you and your health needs are our priority.  As part of our continuing mission to provide you with exceptional heart care, we have created designated Provider Care Teams.  These Care Teams include your primary Cardiologist (physician) and Advanced Practice Providers (APPs -  Physician Assistants and Nurse Practitioners) who all work together to provide you with the care you need, when you need it. Your next appointment:   12 month(s) The format for your next appointment:   In Person Provider:   You may see Sherren Mocha, MD or one of the following Advanced Practice Providers on your designated Care Team:    Richardson Dopp, PA-C  Vin [place], [place]  Daune Perch, [place]

## 2020-01-10 NOTE — Progress Notes (Signed)
Cardiology Office Note:    Date:  01/10/2020   ID:  Donna, Williamson [birth year], MRN [id number]  PCP:  Crist Infante, MD  Cardiologist:  Sherren Mocha, MD  Electrophysiologist:  None   Referring MD: Crist Infante, MD   Chief Complaint  Patient presents with  . Shortness of Breath    History of Present Illness:    Donna Williamson is a 74 y.o. female with a hx of coronary artery disease status post PCI, hypertension, hyperlipidemia, chronic diastolic heart failure, and stage III chronic kidney disease, presenting for follow-up evaluation.  Patient initially presented in 2017 with non-STEMI.  She was found to have severe stenosis of the first OM branch of the circumflex and was treated with a synergy drug-eluting stent.  She was noted to have moderate residual stenosis in the LAD with medical therapy recommended.  Her last stress test in 2019 demonstrated normal myocardial perfusion with no ischemia.  LVEF by nuclear stress test was calculated at 55%.  When she was seen last in August 2020 she was felt to have some evidence of volume overload on exam.  Sodium restriction was discussed and her furosemide dose was increased.  She is here alone today.  She continues to have symptoms of shortness of breath with activity.  She denies orthopnea, PND, or leg swelling.  She denies heart palpitations.  She does have substernal discomfort that occurs at times of stress.  She is dealing with a lot of stress because her elderly mother has developed dementia and she is dealing with her loss of functional independence.  She denies exertional chest pain but does have episodes of pain that reminds her of her previous angina.  Past Medical History:  Diagnosis Date  . Anemia   . Anginal pain ([place])   . Anxiety   . Arthritis    "feet, knees, back" (05/02/2016)  . Asthma   . CAD (coronary artery disease)    a. 04/2016: NSTEMI 95% stenosis 1st Mrg (s/p DES)  . Chest pain    a. 10/2013 Exercise Myoview:  Ef 65%, no ischemia.  . CHF (congestive heart failure) ([place])   . Chronic bronchitis ([place])   . Chronic lower back pain   . COPD (chronic obstructive pulmonary disease) ([place])   . GERD (gastroesophageal reflux disease)   . History of blood transfusion 1960s   "related to my hysterectomy"  . Hypercholesterolemia   . Hypertension   . Lower extremity edema   . Mitral valve prolapse   . NSTEMI (non-ST elevated myocardial infarction) ([place]) 05/01/2016  . Pneumonia 05/01/2016   "saw trace of slight pneumonia/CT scan" (05/02/2016)  . Seasonal allergies   . Sjogren's syndrome ([place])   . Wheezing     Past Surgical History:  Procedure Laterality Date  . ABDOMINAL HYSTERECTOMY  1960s   "partial"  . CARDIAC CATHETERIZATION N/A 05/02/2016   Procedure: Right/Left Heart Cath and Coronary Angiography;  Surgeon: Jettie Booze, MD;  Location: [facility];  Service: Cardiovascular;  Laterality: N/A;  . CARDIAC CATHETERIZATION N/A 05/02/2016   Procedure: Coronary Stent Intervention;  Surgeon: Jettie Booze, MD;  Location: [facility];  Service: Cardiovascular;  Laterality: N/A;  . CATARACT EXTRACTION W/PHACO Right 09/20/2019   Procedure: CATARACT EXTRACTION PHACO AND INTRAOCULAR LENS PLACEMENT ([place]) RIGHT;  Surgeon: Birder Robson, MD;  Location: [facility];  Service: Ophthalmology;  Laterality: Right;  [place] 01:29.4 CDE 19.28 FLUID PACK LOT # [id number]  . CATARACT EXTRACTION W/PHACO Left  10/15/2019   Procedure: CATARACT EXTRACTION PHACO AND INTRAOCULAR LENS PLACEMENT (IOC) LEFT 8.76, 00:48.8;  Surgeon: Birder Robson, MD;  Location: [facility];  Service: Ophthalmology;  Laterality: Left;  . COLONOSCOPY W/ BIOPSIES AND POLYPECTOMY    . CORONARY ANGIOPLASTY WITH STENT PLACEMENT  05/02/2016   "1 stent"  . ESOPHAGOGASTRODUODENOSCOPY    . JOINT REPLACEMENT Left 2007   hip  . LAPAROSCOPIC SALPINGOOPHERECTOMY Bilateral ~ 1990  . TOTAL HIP ARTHROPLASTY Left 2007    Current  Medications: Current Meds  Medication Sig  . albuterol (PROVENTIL) (2.5 MG/3ML) 0.083% nebulizer solution Take 3 mLs (2.5 mg total) by nebulization every 2 (two) hours as needed for wheezing or shortness of breath.  . ALPRAZolam (XANAX) 0.5 MG tablet Take 0.5 mg by mouth daily as needed for anxiety.   Marland Kitchen aspirin EC 81 MG tablet Take 81 mg by mouth daily.  . Cholecalciferol (VITAMIN D3) 2000 UNITS TABS Take 1 tablet by mouth daily.  . cyanocobalamin 500 MCG tablet Take 500 mcg by mouth daily.  Marland Kitchen diltiazem (CARDIZEM CD) 240 MG 24 hr capsule Take 240 mg by mouth daily.   Marland Kitchen EPIPEN 2-PAK 0.3 MG/0.3ML SOAJ injection Inject 0.3 mg into the skin daily as needed (allergic reaction).   Marland Kitchen estradiol (ESTRACE) 1 MG tablet Take 1 mg by mouth daily.   Marland Kitchen FLUoxetine (PROZAC) 10 MG tablet Take 10 mg by mouth daily.  . fluticasone (FLONASE) 50 MCG/ACT nasal spray Place 2 sprays into the nose daily.  . furosemide (LASIX) 40 MG tablet TAKE 1 TABLET(40 MG) BY MOUTH DAILY  . gabapentin (NEURONTIN) 100 MG capsule Take 100 mg by mouth at bedtime.   . isosorbide mononitrate (IMDUR) 30 MG 24 hr tablet Take 15 mg by mouth daily. TAKE HALF TABLET OF 30 MG BY MOUTH (15 MG TOTAL) ONCE DAILY  . metoprolol tartrate (LOPRESSOR) 25 MG tablet Take 0.5 tablets (12.5 mg total) by mouth 2 (two) times daily.  . montelukast (SINGULAIR) 10 MG tablet Take 10 mg by mouth at bedtime.  . Multiple Vitamin (MULTIVITAMIN WITH MINERALS) TABS tablet Take 1 tablet by mouth daily.  . nitroGLYCERIN (NITROSTAT) 0.4 MG SL tablet Place 1 tablet (0.4 mg total) under the tongue every 5 (five) minutes as needed for chest pain.  Marland Kitchen omeprazole (PRILOSEC) 40 MG capsule Take 40 mg by mouth daily.  Marland Kitchen PROAIR HFA 108 (90 BASE) MCG/ACT inhaler Inhale 1-2 puffs into the lungs every 4 (four) hours as needed for wheezing.   . rosuvastatin (CRESTOR) 20 MG tablet Take 1 tablet (20 mg total) by mouth daily at 6 PM.  . SYMBICORT 160-4.5 MCG/ACT inhaler Inhale 2 puffs  into the lungs 2 (two) times daily.     Allergies:   Levaquin [levofloxacin], Codeine, Nitrofurantoin, Ciprofloxacin, Penicillins, and Sulfa antibiotics   Social History   Socioeconomic History  . Marital status: Married    Spouse name: Not on file  . Number of children: Not on file  . Years of education: Not on file  . Highest education level: Not on file  Occupational History  . Not on file  Tobacco Use  . Smoking status: Never Smoker  . Smokeless tobacco: Never Used  Substance and Sexual Activity  . Alcohol use: No    Alcohol/week: 0.0 standard drinks  . Drug use: No  . Sexual activity: Yes  Other Topics Concern  . Not on file  Social History Narrative   Lives in [place] with husband.  Active but does not routinely  exercise.   Social Determinants of Health   Financial Resource Strain:   . Difficulty of Paying Living Expenses:   Food Insecurity:   . Worried About Charity fundraiser in the Last Year:   . Arboriculturist in the Last Year:   Transportation Needs:   . Film/video editor (Medical):   Marland Kitchen Lack of Transportation (Non-Medical):   Physical Activity:   . Days of Exercise per Week:   . Minutes of Exercise per Session:   Stress:   . Feeling of Stress :   Social Connections:   . Frequency of Communication with Friends and Family:   . Frequency of Social Gatherings with Friends and Family:   . Attends Religious Services:   . Active Member of Clubs or Organizations:   . Attends Archivist Meetings:   Marland Kitchen Marital Status:      Family History: The patient's family history includes Heart disease in her father; Hypertension in her mother; Ovarian cancer in her sister; Stroke in her mother.  ROS:   Please see the history of present illness.    All other systems reviewed and are negative.  EKGs/Labs/Other Studies Reviewed:    The following studies were reviewed today: Myoview 07-26-2018: Study Highlights    Nuclear stress EF: 52%.  There was no  ST segment deviation noted during stress.  The left ventricular ejection fraction is mildly decreased (45-54%).  This is a low risk study.   Normal perfusion but LV appears dilated with apical hypokinesis EF low normal 52%  No infarct or ischemia    Echo 07-25-2018: Study Conclusions   - Left ventricle: The cavity size was normal. Systolic function was  normal. The estimated ejection fraction was 55%. Wall motion was  normal; there were no regional wall motion abnormalities. There  was an increased relative contribution of atrial contraction to  ventricular filling. Doppler parameters are consistent with  abnormal left ventricular relaxation (grade 1 diastolic  dysfunction).  - Aortic valve: Trileaflet; mildly thickened, moderately calcified  leaflets.  - Mitral valve: There was trivial regurgitation.  - Left atrium: The atrium was mildly dilated.  - Right ventricle: The cavity size was mildly dilated. Wall  thickness was normal.   Zio Monitor 06-13-2019: Study Highlights  1) The basic rhythm is normal sinus with an average HR of 67 bpm 2) Occasional short runs of SVT 8-10 beats 3) Occasional PVC's with burden < 1% 4) No sustained arrhythmia, bradycardic events, or pathologic pauses    EKG:  EKG is not ordered today.    Recent Labs: 06/20/2019: BUN 21; Creatinine, Ser 1.34; NT-Pro BNP 760; Potassium 4.8; Sodium 140  Recent Lipid Panel    Component Value Date/Time   CHOL 132 08/09/2016 0738   TRIG 201 (H) 08/09/2016 0738   HDL 42 (L) 08/09/2016 0738   CHOLHDL 3.1 08/09/2016 0738   VLDL 40 (H) 08/09/2016 0738   LDLCALC 50 08/09/2016 0738    Physical Exam:    VS:  BP 110/68   Pulse 80   Ht 5\' 5"  (1.651 m)   Wt 228 lb 6.4 oz (103.6 kg)   SpO2 95%   BMI 38.01 kg/m     Wt Readings from Last 3 Encounters:  01/10/20 228 lb 6.4 oz (103.6 kg)  10/15/19 232 lb (105.2 kg)  09/13/19 230 lb (104.3 kg)     GEN: Well nourished, well developed in no acute  distress HEENT: Normal NECK: No JVD; No carotid bruits  LYMPHATICS: No lymphadenopathy CARDIAC: RRR, no murmurs, rubs, gallops RESPIRATORY:  Clear to auscultation without rales, wheezing or rhonchi  ABDOMEN: Soft, non-tender, non-distended MUSCULOSKELETAL:  No edema; No deformity  SKIN: Warm and dry NEUROLOGIC:  Alert and oriented x 3 PSYCHIATRIC:  Normal affect   ASSESSMENT:    1. Coronary artery disease involving native coronary artery of native heart with angina pectoris ([place])   2. Chronic diastolic heart failure (HCC)   3. Stage 3a chronic kidney disease   4. Essential hypertension   5. Mixed hyperlipidemia    PLAN:    In order of problems listed above:  1. Some progression of symptoms, may partially be stress related.  I did review her previous cardiac catheterization films which demonstrated moderate LAD/diagonal stenosis and moderate residual stenosis in the circumflex beyond the area that was stented.  I think she should have another ischemic evaluation with a Lexiscan Myoview stress test.  I recommended an increase in isosorbide from 15 to 30 mg daily.  She will continue on diltiazem and metoprolol tartrate at current doses.  She is treated with a high intensity statin drug using rosuvastatin 20 mg. 2. New York Heart Association functional class II symptoms.  Continue furosemide 20 mg daily.  Blood pressure well controlled.  Weight management will be important. 3. Reviewed most recent labs. 4. Blood pressure well controlled as above.  Treated with diltiazem, furosemide, metoprolol, and isosorbide. 5. Treated with rosuvastatin 20 mg daily.  LDL cholesterol is 75 mg/dL.   Medication Adjustments/Labs and Tests Ordered: Current medicines are reviewed at length with the patient today.  Concerns regarding medicines are outlined above.  No orders of the defined types were placed in this encounter.  No orders of the defined types were placed in this encounter.   Patient  Instructions  Medication Instructions:  1) INCREASE IMDUR to 30 mg daily *If you need a refill on your cardiac medications before your next appointment, please call your pharmacy*  Testing/Procedures: Your provider has requested that you have a lexiscan myoview. For further information please visit [email or web address]. Please follow instruction sheet, as given.  Follow-Up: At [facility], you and your health needs are our priority.  As part of our continuing mission to provide you with exceptional heart care, we have created designated Provider Care Teams.  These Care Teams include your primary Cardiologist (physician) and Advanced Practice Providers (APPs -  Physician Assistants and Nurse Practitioners) who all work together to provide you with the care you need, when you need it. Your next appointment:   12 month(s) The format for your next appointment:   In Person Provider:   You may see Sherren Mocha, MD or one of the following Advanced Practice Providers on your designated Care Team:    Richardson Dopp, PA-C  Vin [place], PA-C  Daune Perch, [place]      Signed, Sherren Mocha, MD  01/10/2020 3:58 PM    [place]

## 2020-01-15 ENCOUNTER — Telehealth (HOSPITAL_COMMUNITY): Payer: Self-pay

## 2020-01-15 NOTE — Telephone Encounter (Signed)
Attempted to contact the patient, detailed instructions were left on the patient;s answering machine. Asked to call back with any questions. S.Jamilex Bohnsack EMTP

## 2020-01-16 ENCOUNTER — Other Ambulatory Visit: Payer: Self-pay

## 2020-01-16 ENCOUNTER — Ambulatory Visit (HOSPITAL_COMMUNITY): Payer: Medicare PPO | Attending: Internal Medicine

## 2020-01-16 DIAGNOSIS — I25119 Atherosclerotic heart disease of native coronary artery with unspecified angina pectoris: Secondary | ICD-10-CM | POA: Insufficient documentation

## 2020-01-16 MED ORDER — TECHNETIUM TC 99M TETROFOSMIN IV KIT
32.7000 | PACK | Freq: Once | INTRAVENOUS | Status: AC | PRN
  Administered 2020-01-16: 32.7 via INTRAVENOUS
  Filled 2020-01-16: qty 33

## 2020-01-16 MED ORDER — REGADENOSON 0.4 MG/5ML IV SOLN
0.4000 mg | Freq: Once | INTRAVENOUS | Status: AC
  Administered 2020-01-16: 0.4 mg via INTRAVENOUS

## 2020-01-20 ENCOUNTER — Ambulatory Visit (HOSPITAL_COMMUNITY): Payer: Medicare PPO | Attending: Cardiology

## 2020-01-20 ENCOUNTER — Other Ambulatory Visit: Payer: Self-pay

## 2020-01-20 LAB — MYOCARDIAL PERFUSION IMAGING
LV dias vol: 94 mL (ref 46–106)
LV sys vol: 43 mL
Peak HR: 76 {beats}/min
Rest HR: 61 {beats}/min
SDS: 0
SRS: 0
SSS: 0
TID: 0.92

## 2020-01-20 MED ORDER — TECHNETIUM TC 99M TETROFOSMIN IV KIT
32.4000 | PACK | Freq: Once | INTRAVENOUS | Status: AC | PRN
  Administered 2020-01-20: 32.4 via INTRAVENOUS
  Filled 2020-01-20: qty 33

## 2020-06-26 DIAGNOSIS — Z1231 Encounter for screening mammogram for malignant neoplasm of breast: Secondary | ICD-10-CM | POA: Diagnosis not present

## 2020-07-10 ENCOUNTER — Other Ambulatory Visit: Payer: Self-pay | Admitting: Cardiovascular Disease

## 2020-07-13 DIAGNOSIS — R928 Other abnormal and inconclusive findings on diagnostic imaging of breast: Secondary | ICD-10-CM | POA: Diagnosis not present

## 2020-07-23 DIAGNOSIS — M1712 Unilateral primary osteoarthritis, left knee: Secondary | ICD-10-CM | POA: Diagnosis not present

## 2020-07-24 DIAGNOSIS — J45901 Unspecified asthma with (acute) exacerbation: Secondary | ICD-10-CM | POA: Insufficient documentation

## 2020-07-24 DIAGNOSIS — J45909 Unspecified asthma, uncomplicated: Secondary | ICD-10-CM | POA: Insufficient documentation

## 2020-07-29 DIAGNOSIS — H26491 Other secondary cataract, right eye: Secondary | ICD-10-CM | POA: Diagnosis not present

## 2020-08-19 DIAGNOSIS — Z23 Encounter for immunization: Secondary | ICD-10-CM | POA: Diagnosis not present

## 2020-09-09 DIAGNOSIS — R7301 Impaired fasting glucose: Secondary | ICD-10-CM | POA: Diagnosis not present

## 2020-09-09 DIAGNOSIS — E785 Hyperlipidemia, unspecified: Secondary | ICD-10-CM | POA: Diagnosis not present

## 2020-09-16 DIAGNOSIS — H6122 Impacted cerumen, left ear: Secondary | ICD-10-CM | POA: Diagnosis not present

## 2020-09-16 DIAGNOSIS — R7301 Impaired fasting glucose: Secondary | ICD-10-CM | POA: Diagnosis not present

## 2020-09-16 DIAGNOSIS — R2681 Unsteadiness on feet: Secondary | ICD-10-CM | POA: Diagnosis not present

## 2020-09-16 DIAGNOSIS — I252 Old myocardial infarction: Secondary | ICD-10-CM | POA: Diagnosis not present

## 2020-09-16 DIAGNOSIS — N1831 Chronic kidney disease, stage 3a: Secondary | ICD-10-CM | POA: Diagnosis not present

## 2020-09-16 DIAGNOSIS — E785 Hyperlipidemia, unspecified: Secondary | ICD-10-CM | POA: Diagnosis not present

## 2020-09-16 DIAGNOSIS — D649 Anemia, unspecified: Secondary | ICD-10-CM | POA: Diagnosis not present

## 2020-09-16 DIAGNOSIS — I10 Essential (primary) hypertension: Secondary | ICD-10-CM | POA: Diagnosis not present

## 2020-09-16 DIAGNOSIS — Z0000 Encounter for general adult medical examination without abnormal findings: Secondary | ICD-10-CM | POA: Diagnosis not present

## 2020-09-17 ENCOUNTER — Other Ambulatory Visit: Payer: Self-pay | Admitting: Internal Medicine

## 2020-09-17 DIAGNOSIS — R2681 Unsteadiness on feet: Secondary | ICD-10-CM

## 2020-09-22 DIAGNOSIS — Z1212 Encounter for screening for malignant neoplasm of rectum: Secondary | ICD-10-CM | POA: Diagnosis not present

## 2020-10-09 ENCOUNTER — Ambulatory Visit
Admission: RE | Admit: 2020-10-09 | Discharge: 2020-10-09 | Disposition: A | Discharge status: Home / Self Care | Payer: Medicare PPO | Source: Ambulatory Visit | Attending: Internal Medicine | Admitting: Internal Medicine

## 2020-10-09 DIAGNOSIS — K110 Atrophy of salivary gland: Secondary | ICD-10-CM | POA: Diagnosis not present

## 2020-10-09 DIAGNOSIS — I6782 Cerebral ischemia: Secondary | ICD-10-CM | POA: Diagnosis not present

## 2020-10-09 DIAGNOSIS — R2689 Other abnormalities of gait and mobility: Secondary | ICD-10-CM | POA: Diagnosis not present

## 2020-10-09 DIAGNOSIS — R2681 Unsteadiness on feet: Secondary | ICD-10-CM

## 2020-10-09 DIAGNOSIS — H748X2 Other specified disorders of left middle ear and mastoid: Secondary | ICD-10-CM | POA: Diagnosis not present

## 2020-10-12 DIAGNOSIS — N760 Acute vaginitis: Secondary | ICD-10-CM | POA: Diagnosis not present

## 2020-10-12 DIAGNOSIS — Z6838 Body mass index (BMI) 38.0-38.9, adult: Secondary | ICD-10-CM | POA: Diagnosis not present

## 2020-10-12 DIAGNOSIS — Z01419 Encounter for gynecological examination (general) (routine) without abnormal findings: Secondary | ICD-10-CM | POA: Diagnosis not present

## 2020-10-14 DIAGNOSIS — M1712 Unilateral primary osteoarthritis, left knee: Secondary | ICD-10-CM | POA: Diagnosis not present

## 2020-10-16 ENCOUNTER — Telehealth: Payer: Self-pay

## 2020-10-16 NOTE — Telephone Encounter (Signed)
Late entry from this AM:  The patient's husband was in for his visit today with Dr. Burt Knack and gave Dr. Burt Knack a cardiac clearance request for Donna Williamson for LTKR with Dr. Ronnie Derby.  Called the patient and she states she has not had any CP, SOB or any cardiac symptoms since last visit.  She does report her BP has been a little elevated (130-140/60-70).  Dr. Burt Knack gave no new orders at this time. The patient will monitor her BP and call if consistently elevated.  Dr. Burt Knack signed clearance and was given to the patient's husband to take to the patient (who is in the car).  They were grateful for call and agree with plan.

## 2020-11-03 DIAGNOSIS — J3089 Other allergic rhinitis: Secondary | ICD-10-CM | POA: Diagnosis not present

## 2020-11-03 DIAGNOSIS — H1045 Other chronic allergic conjunctivitis: Secondary | ICD-10-CM | POA: Diagnosis not present

## 2020-11-03 DIAGNOSIS — J4530 Mild persistent asthma, uncomplicated: Secondary | ICD-10-CM | POA: Diagnosis not present

## 2020-11-03 DIAGNOSIS — T50995D Adverse effect of other drugs, medicaments and biological substances, subsequent encounter: Secondary | ICD-10-CM | POA: Diagnosis not present

## 2021-01-01 DIAGNOSIS — M1712 Unilateral primary osteoarthritis, left knee: Secondary | ICD-10-CM | POA: Diagnosis not present

## 2021-01-13 ENCOUNTER — Other Ambulatory Visit: Payer: Self-pay | Admitting: Orthopedic Surgery

## 2021-01-15 ENCOUNTER — Ambulatory Visit: Payer: Medicare PPO | Admitting: Cardiovascular Disease

## 2021-01-22 ENCOUNTER — Encounter: Payer: Self-pay | Admitting: Cardiovascular Disease

## 2021-01-22 ENCOUNTER — Other Ambulatory Visit: Payer: Self-pay

## 2021-01-22 ENCOUNTER — Ambulatory Visit: Payer: Medicare PPO | Admitting: Cardiovascular Disease

## 2021-01-22 VITALS — BP 120/72 | HR 66 | Ht 65.0 in | Wt 229.2 lb

## 2021-01-22 DIAGNOSIS — N1831 Chronic kidney disease, stage 3a: Secondary | ICD-10-CM | POA: Diagnosis not present

## 2021-01-22 DIAGNOSIS — I10 Essential (primary) hypertension: Secondary | ICD-10-CM | POA: Diagnosis not present

## 2021-01-22 DIAGNOSIS — I5032 Chronic diastolic (congestive) heart failure: Secondary | ICD-10-CM | POA: Diagnosis not present

## 2021-01-22 DIAGNOSIS — E782 Mixed hyperlipidemia: Secondary | ICD-10-CM | POA: Diagnosis not present

## 2021-01-22 DIAGNOSIS — I25119 Atherosclerotic heart disease of native coronary artery with unspecified angina pectoris: Secondary | ICD-10-CM

## 2021-01-22 MED ORDER — OLMESARTAN MEDOXOMIL 20 MG PO TABS: 10.0000 mg | ORAL_TABLET | Freq: Every day | ORAL | 3 refills | Status: DC

## 2021-01-22 NOTE — Patient Instructions (Addendum)
Medication Instructions:  1) STOP DILTIAZEM  2) START BENICAR 10 mg daily  *If you need a refill on your cardiac medications before your next appointment, please call your pharmacy*  Lab Work: You will have labs drawn when you return for your echocardiogram. You do not need to be fasting.  If you have labs (blood work) drawn today and your tests are completely normal, you will receive your results only by: Marland Kitchen MyChart Message (if you have MyChart) OR . A paper copy in the mail If you have any lab test that is abnormal or we need to change your treatment, we will call you to review the results.  Testing/Procedures: Your provider has requested that you have an echocardiogram. Echocardiography is a painless test that uses sound waves to create images of your heart. It provides your doctor with information about the size and shape of your heart and how well your heart's chambers and valves are working. This procedure takes approximately one hour. There are no restrictions for this procedure.    Follow-Up: At [facility], you and your health needs are our priority.  As part of our continuing mission to provide you with exceptional heart care, we have created designated Provider Care Teams.  These Care Teams include your primary Cardiologist (physician) and Advanced Practice Providers (APPs -  Physician Assistants and Nurse Practitioners) who all work together to provide you with the care you need, when you need it. Your next appointment:   12 month(s) The format for your next appointment:   In Person Provider:   You may see Sherren Mocha, MD or one of the following Advanced Practice Providers on your designated Care Team:    Richardson Dopp, PA-C  Vin [place], [place]

## 2021-01-22 NOTE — Progress Notes (Signed)
Cardiology Office Note:    Date:  01/22/2021   ID:  Donna Williamson, Donna Williamson Jul 08, 1946, MRN [id number]  PCP:  Crist Infante, [place]  Cardiologist:  Sherren Mocha, MD  Advanced Practice Provider:  No care team member to display Electrophysiologist:  None       Referring MD: Crist Infante, MD   Chief Complaint  Patient presents with   Coronary Artery Disease    History of Present Illness:    Donna Williamson is a 75 y.o. female with a hx of coronary artery disease status post PCI, hypertension, hyperlipidemia, chronic diastolic heart failure, and stage IIIa chronic kidney disease, presenting for follow-up evaluation.  Patient initially presented in 2017 with non-STEMI.  She was found to have severe stenosis of the first OM branch of the circumflex and was treated with a synergy drug-eluting stent.  She was noted to have moderate residual stenosis in the LAD with medical therapy recommended.  Her last stress test in 2019 demonstrated normal myocardial perfusion with no ischemia.  LVEF by nuclear stress test was calculated at 55%.  Patient is here alone today.  She is under a lot of stress caring for her elderly mother who has dementia but continues to live independently.  Her husband, Donna Williamson, also has multiple medical problems.  The patient describes exertional dyspnea with any moderate level activity.  She is short of breath walking less than 1 city block.  She denies orthopnea or PND.  She does have chronic leg swelling which is unchanged.  She has noticed slight progression of her shortness of breath but no marked change.  She has no chest pain or pressure.  She does experience heart palpitations but only when she is under extreme stress.  Past Medical History:  Diagnosis Date   Anemia    Anginal pain ([place])    Anxiety    Arthritis    "feet, knees, back" (05/02/2016)   Asthma    CAD (coronary artery disease)    a. 04/2016: NSTEMI 95% stenosis  1st Mrg (s/p DES)   Chest pain    a. 10/2013 Exercise Myoview: Ef 65%, no ischemia.   CHF (congestive heart failure) (HCC)    Chronic bronchitis (HCC)    Chronic lower back pain    COPD (chronic obstructive pulmonary disease) (HCC)    GERD (gastroesophageal reflux disease)    History of blood transfusion 1960s   "related to my hysterectomy"   Hypercholesterolemia    Hypertension    Lower extremity edema    Mitral valve prolapse    NSTEMI (non-ST elevated myocardial infarction) ([place]) 05/01/2016   Pneumonia 05/01/2016   "saw trace of slight pneumonia/CT scan" (05/02/2016)   Seasonal allergies    Sjogren's syndrome ([place])    Wheezing     Past Surgical History:  Procedure Laterality Date   ABDOMINAL HYSTERECTOMY  1960s   "partial"   CARDIAC CATHETERIZATION N/A 05/02/2016   Procedure: Right/Left Heart Cath and Coronary Angiography;  Surgeon: Jettie Booze, MD;  Location: [facility];  Service: Cardiovascular;  Laterality: N/A;   CARDIAC CATHETERIZATION N/A 05/02/2016   Procedure: Coronary Stent Intervention;  Surgeon: Jettie Booze, MD;  Location: [facility];  Service: Cardiovascular;  Laterality: N/A;   CATARACT EXTRACTION W/PHACO Right 09/20/2019   Procedure: CATARACT EXTRACTION PHACO AND INTRAOCULAR LENS PLACEMENT ([place]) RIGHT;  Surgeon: Birder Robson, MD;  Location: [facility];  Service: Ophthalmology;  Laterality: Right;  [place] 01:29.4  CDE 19.28 FLUID PACK LOT # [id number]   CATARACT EXTRACTION W/PHACO Left 10/15/2019   Procedure: CATARACT EXTRACTION PHACO AND INTRAOCULAR LENS PLACEMENT ([facility]) LEFT 8.76, 00:48.8;  Surgeon: Birder Robson, MD;  Location: [facility];  Service: Ophthalmology;  Laterality: Left;   COLONOSCOPY W/ BIOPSIES AND POLYPECTOMY     CORONARY ANGIOPLASTY WITH STENT PLACEMENT  05/02/2016   "1 stent"   ESOPHAGOGASTRODUODENOSCOPY     JOINT REPLACEMENT Left 2007   hip   LAPAROSCOPIC SALPINGOOPHERECTOMY Bilateral ~  [place] Left 2007    Current Medications: Current Meds  Medication Sig   albuterol (PROVENTIL) (2.5 MG/3ML) 0.083% nebulizer solution Take 3 mLs (2.5 mg total) by nebulization every 2 (two) hours as needed for wheezing or shortness of breath.   ALPRAZolam (XANAX) 0.5 MG tablet Take 0.5 mg by mouth daily as needed for anxiety.    aspirin EC 81 MG tablet Take 81 mg by mouth daily.   cetirizine (ZYRTEC) 10 MG tablet 1 tablet   Cholecalciferol (VITAMIN D3) 2000 UNITS TABS Take 1 tablet by mouth daily.   cyanocobalamin 500 MCG tablet Take 500 mcg by mouth daily.   EPIPEN 2-PAK 0.3 MG/0.3ML SOAJ injection Inject 0.3 mg into the skin daily as needed (allergic reaction).    estradiol (ESTRACE) 1 MG tablet Take 1 mg by mouth daily.    FLUoxetine (PROZAC) 10 MG capsule    FLUoxetine (PROZAC) 10 MG tablet Take 10 mg by mouth daily.   fluticasone (FLONASE) 50 MCG/ACT nasal spray Place 2 sprays into the nose daily.   furosemide (LASIX) 40 MG tablet TAKE 1 TABLET(40 MG) BY MOUTH DAILY   gabapentin (NEURONTIN) 100 MG capsule Take 100 mg by mouth at bedtime.    metoprolol tartrate (LOPRESSOR) 25 MG tablet Take 0.5 tablets (12.5 mg total) by mouth 2 (two) times daily.   montelukast (SINGULAIR) 10 MG tablet Take 10 mg by mouth at bedtime.   Multiple Vitamin (MULTIVITAMIN WITH MINERALS) TABS tablet Take 1 tablet by mouth daily.   nitroGLYCERIN (NITROSTAT) 0.4 MG SL tablet Place 1 tablet (0.4 mg total) under the tongue every 5 (five) minutes as needed for chest pain.   olmesartan (BENICAR) 20 MG tablet Take 0.5 tablets (10 mg total) by mouth daily.   Olopatadine HCl 0.2 % SOLN    omeprazole (PRILOSEC) 40 MG capsule Take 40 mg by mouth daily.   PROAIR HFA 108 (90 BASE) MCG/ACT inhaler Inhale 1-2 puffs into the lungs every 4 (four) hours as needed for wheezing.    rosuvastatin (CRESTOR) 20 MG tablet Take 1 tablet (20 mg total) by mouth daily at 6 PM.   SYMBICORT  160-4.5 MCG/ACT inhaler Inhale 2 puffs into the lungs 2 (two) times daily.   [DISCONTINUED] diltiazem (CARDIZEM CD) 240 MG 24 hr capsule Take 240 mg by mouth daily.      Allergies:   Levaquin [levofloxacin], Codeine, Nitrofurantoin, Ciprofloxacin, Penicillins, and Sulfa antibiotics   Social History   Socioeconomic History   Marital status: Married    Spouse name: Not on file   Number of children: Not on file   Years of education: Not on file   Highest education level: Not on file  Occupational History   Not on file  Tobacco Use   Smoking status: Never Smoker   Smokeless tobacco: Never Used  Vaping Use   Vaping Use: Never used  Substance and Sexual Activity   Alcohol use: No    Alcohol/week: 0.0 standard drinks  Drug use: No   Sexual activity: Yes  Other Topics Concern   Not on file  Social History Narrative   Lives in [place] with husband.  Active but does not routinely exercise.   Social Determinants of Health   Financial Resource Strain: Not on file  Food Insecurity: Not on file  Transportation Needs: Not on file  Physical Activity: Not on file  Stress: Not on file  Social Connections: Not on file     Family History: The patient's family history includes Heart disease in her father; Hypertension in her mother; Ovarian cancer in her sister; Stroke in her mother.  ROS:   Please see the history of present illness.    All other systems reviewed and are negative.  EKGs/Labs/Other Studies Reviewed:    The following studies were reviewed today: Myoview Stress Test 01/20/2020: Study Highlights   Nuclear stress EF: 54%.  The left ventricular ejection fraction is mildly decreased (45-54%).  There was no ST segment deviation noted during stress.  There is a small defect of mild severity present in the apex location. The defect is non-reversible.This is consistent with diaphragmatic attenuation artifact and extracardiac uptake. No ischemia noted.  This is  a low risk study.  Echo 07/25/2018: Study Conclusions   - Left ventricle: The cavity size was normal. Systolic function was  normal. The estimated ejection fraction was 55%. Wall motion was  normal; there were no regional wall motion abnormalities. There  was an increased relative contribution of atrial contraction to  ventricular filling. Doppler parameters are consistent with  abnormal left ventricular relaxation (grade 1 diastolic  dysfunction).  - Aortic valve: Trileaflet; mildly thickened, moderately calcified  leaflets.  - Mitral valve: There was trivial regurgitation.  - Left atrium: The atrium was mildly dilated.  - Right ventricle: The cavity size was mildly dilated. Wall  thickness was normal.   EKG:  EKG is ordered today.  The ekg ordered today demonstrates normal sinus rhythm 66 bpm, within normal limits.  Recent Labs: No results found for requested labs within last 8760 hours.  Recent Lipid Panel    Component Value Date/Time   CHOL 132 08/09/2016 0738   TRIG 201 (H) 08/09/2016 0738   HDL 42 (L) 08/09/2016 0738   CHOLHDL 3.1 08/09/2016 0738   VLDL 40 (H) 08/09/2016 0738   LDLCALC 50 08/09/2016 0738     Risk Assessment/Calculations:       Physical Exam:    VS:  BP 120/72    Pulse 66    Ht 5\' 5"  (1.651 m)    Wt 229 lb 3.2 oz (104 kg)    SpO2 93%    BMI 38.14 kg/m     Wt Readings from Last 3 Encounters:  01/22/21 229 lb 3.2 oz (104 kg)  01/16/20 228 lb (103.4 kg)  01/10/20 228 lb 6.4 oz (103.6 kg)     GEN:  Well nourished, well developed in no acute distress HEENT: Normal NECK: No JVD; No carotid bruits LYMPHATICS: No lymphadenopathy CARDIAC: RRR, no murmurs, rubs, gallops RESPIRATORY:  Clear to auscultation without rales, wheezing or rhonchi  ABDOMEN: Soft, non-tender, non-distended MUSCULOSKELETAL:  No edema; No deformity  SKIN: Warm and dry NEUROLOGIC:  Alert and oriented x 3 PSYCHIATRIC:  Normal affect   ASSESSMENT:    1.  Coronary artery disease involving native coronary artery of native heart with angina pectoris ([place])   2. Chronic diastolic heart failure (HCC)   3. Stage 3a chronic kidney disease ([place])  4. Essential hypertension   5. Mixed hyperlipidemia    PLAN:    In order of problems listed above:  1. The patient is stable with no symptoms of angina on her current medical program.  She continues on aspirin and a statin drug. 2. Appears euvolemic.  Recommend to discontinue diltiazem and start olmesartan 10 mg daily.  We will follow-up with a metabolic panel in about 2 weeks.  I reviewed her echocardiogram from 2019.  Recommend an updated echocardiogram to reassess systolic and diastolic function. 3. Add Benicar.  Stop diltiazem.  Avoid nephrotoxins.  Check metabolic panel in 2 weeks. 4. Med changes as outlined above.  She was also treated with a beta-blocker and loop diuretic. 5. Lipids are at goal with an HDL of 59 and an LDL of 68 on rosuvastatin 20 mg daily.  We discussed weight management, diet, and exercise today.  The patient will have an echocardiogram, metabolic panel in about 2 weeks.  Medication changes as outlined above.  I would like to see her back in 1 year for clinical follow-up. She will require knee replacement administered scheduled in May of this year.  She is on cardiac risk well no angina unstable.  She may proceed without any further cardiac testing at this time.  Her stress test from last year is reviewed and showed no significant ischemia.  Medication Adjustments/Labs and Tests Ordered: Current medicines are reviewed at length with the patient today.  Concerns regarding medicines are outlined above.  Orders Placed This Encounter  Procedures   Basic metabolic panel   EKG 16-BWGY   ECHOCARDIOGRAM COMPLETE   Meds ordered this encounter  Medications   olmesartan (BENICAR) 20 MG tablet    Sig: Take 0.5 tablets (10 mg total) by mouth daily.    Dispense:  45 tablet    Refill:   3    Patient Instructions  Medication Instructions:  1) STOP DILTIAZEM  2) START BENICAR 10 mg daily  *If you need a refill on your cardiac medications before your next appointment, please call your pharmacy*  Lab Work: You will have labs drawn when you return for your echocardiogram. You do not need to be fasting.  If you have labs (blood work) drawn today and your tests are completely normal, you will receive your results only by:  [place] (if you have MyChart) OR  A paper copy in the mail If you have any lab test that is abnormal or we need to change your treatment, we will call you to review the results.  Testing/Procedures: Your provider has requested that you have an echocardiogram. Echocardiography is a painless test that uses sound waves to create images of your heart. It provides your doctor with information about the size and shape of your heart and how well your hearts chambers and valves are working. This procedure takes approximately one hour. There are no restrictions for this procedure.    Follow-Up: At [facility], you and your health needs are our priority.  As part of our continuing mission to provide you with exceptional heart care, we have created designated Provider Care Teams.  These Care Teams include your primary Cardiologist (physician) and Advanced Practice Providers (APPs -  Physician Assistants and Nurse Practitioners) who all work together to provide you with the care you need, when you need it. Your next appointment:   12 month(s) The format for your next appointment:   In Person Provider:   You may see Sherren Mocha, MD or  one of the following Advanced Practice Providers on your designated Care Team:    Richardson Dopp, PA-C  Robbie Lis, [place]     Signed, Sherren Mocha, MD  01/22/2021 12:07 PM    [place]

## 2021-02-01 ENCOUNTER — Telehealth: Payer: Self-pay | Admitting: Cardiovascular Disease

## 2021-02-01 NOTE — Telephone Encounter (Signed)
Instructed the patient to [place]. She will monitor BP for a few weeks (she found her BP cuff) and send a message with readings. She understands to contact the office prior to that time if her BP is high or if her symptoms do not improve. She was grateful for call and agrees with plan.

## 2021-02-01 NOTE — Telephone Encounter (Signed)
Pt c/o medication issue:  1. Name of Medication: Benicar  2. How are you currently taking this medication (dosage and times per day)?  2 times a day  3. Are you having a reaction (difficulty breathing--STAT)? no  4. What is your medication issue? Lightheaded and dizzy

## 2021-02-01 NOTE — Telephone Encounter (Signed)
Recommend that she stop taking benicar and monitor her BP with a home cuff. Call in or send MyChart once she gets 6-8 BP readings over a few weeks. thanks

## 2021-02-01 NOTE — Telephone Encounter (Signed)
The patient reports dizziness and lightheadedness since starting Benicar 10 mg daily about 2 weeks ago.  She wakes up in the morning with dizziness, fogginess, and lightheadedness that gets better throughout the day.  She takes her Benicar in the morning about 8AM.  She checked her pulse ox this morning: HR 70s and O2 96% She cannot find her BP cuff to check her pressure.  She is concerned the Benicar is making her feel poorly and doesn't want it to potentially affect her knee surgery coming up in May.  She understands she will be called back with recommendations.

## 2021-02-12 ENCOUNTER — Other Ambulatory Visit: Payer: Self-pay | Admitting: Cardiovascular Disease

## 2021-02-16 ENCOUNTER — Encounter (HOSPITAL_COMMUNITY): Payer: Self-pay | Admitting: Cardiology

## 2021-02-16 ENCOUNTER — Telehealth: Payer: Self-pay | Admitting: Cardiovascular Disease

## 2021-02-16 ENCOUNTER — Other Ambulatory Visit: Payer: Self-pay

## 2021-02-16 ENCOUNTER — Inpatient Hospital Stay (HOSPITAL_COMMUNITY)
Admission: EM | Admit: 2021-02-16 | Discharge: 2021-02-18 | DRG: 247 | Disposition: A | Discharge status: Home / Self Care | Payer: Medicare PPO | Attending: Cardiology | Admitting: Cardiology

## 2021-02-16 ENCOUNTER — Emergency Department (HOSPITAL_COMMUNITY): Payer: Medicare PPO

## 2021-02-16 ENCOUNTER — Encounter: Payer: Self-pay | Admitting: Cardiology

## 2021-02-16 ENCOUNTER — Ambulatory Visit: Payer: Medicare PPO | Admitting: Cardiology

## 2021-02-16 VITALS — BP 142/76 | HR 76 | Ht 65.0 in | Wt 226.0 lb

## 2021-02-16 DIAGNOSIS — R0789 Other chest pain: Secondary | ICD-10-CM | POA: Diagnosis not present

## 2021-02-16 DIAGNOSIS — Z20822 Contact with and (suspected) exposure to covid-19: Secondary | ICD-10-CM | POA: Diagnosis present

## 2021-02-16 DIAGNOSIS — F419 Anxiety disorder, unspecified: Secondary | ICD-10-CM | POA: Diagnosis present

## 2021-02-16 DIAGNOSIS — I130 Hypertensive heart and chronic kidney disease with heart failure and stage 1 through stage 4 chronic kidney disease, or unspecified chronic kidney disease: Secondary | ICD-10-CM | POA: Diagnosis present

## 2021-02-16 DIAGNOSIS — Z79899 Other long term (current) drug therapy: Secondary | ICD-10-CM

## 2021-02-16 DIAGNOSIS — Z8249 Family history of ischemic heart disease and other diseases of the circulatory system: Secondary | ICD-10-CM | POA: Diagnosis not present

## 2021-02-16 DIAGNOSIS — I252 Old myocardial infarction: Secondary | ICD-10-CM

## 2021-02-16 DIAGNOSIS — I200 Unstable angina: Secondary | ICD-10-CM | POA: Diagnosis present

## 2021-02-16 DIAGNOSIS — I2510 Atherosclerotic heart disease of native coronary artery without angina pectoris: Secondary | ICD-10-CM | POA: Diagnosis not present

## 2021-02-16 DIAGNOSIS — Z823 Family history of stroke: Secondary | ICD-10-CM

## 2021-02-16 DIAGNOSIS — I5032 Chronic diastolic (congestive) heart failure: Secondary | ICD-10-CM | POA: Diagnosis not present

## 2021-02-16 DIAGNOSIS — Z8041 Family history of malignant neoplasm of ovary: Secondary | ICD-10-CM

## 2021-02-16 DIAGNOSIS — D631 Anemia in chronic kidney disease: Secondary | ICD-10-CM | POA: Diagnosis present

## 2021-02-16 DIAGNOSIS — M545 Low back pain, unspecified: Secondary | ICD-10-CM | POA: Diagnosis not present

## 2021-02-16 DIAGNOSIS — N1831 Chronic kidney disease, stage 3a: Secondary | ICD-10-CM

## 2021-02-16 DIAGNOSIS — I2583 Coronary atherosclerosis due to lipid rich plaque: Secondary | ICD-10-CM | POA: Diagnosis not present

## 2021-02-16 DIAGNOSIS — E785 Hyperlipidemia, unspecified: Secondary | ICD-10-CM | POA: Diagnosis present

## 2021-02-16 DIAGNOSIS — Z881 Allergy status to other antibiotic agents status: Secondary | ICD-10-CM

## 2021-02-16 DIAGNOSIS — I25110 Atherosclerotic heart disease of native coronary artery with unstable angina pectoris: Principal | ICD-10-CM | POA: Diagnosis present

## 2021-02-16 DIAGNOSIS — G4734 Idiopathic sleep related nonobstructive alveolar hypoventilation: Secondary | ICD-10-CM

## 2021-02-16 DIAGNOSIS — K219 Gastro-esophageal reflux disease without esophagitis: Secondary | ICD-10-CM | POA: Diagnosis not present

## 2021-02-16 DIAGNOSIS — E7800 Pure hypercholesterolemia, unspecified: Secondary | ICD-10-CM | POA: Diagnosis not present

## 2021-02-16 DIAGNOSIS — Z880 Allergy status to penicillin: Secondary | ICD-10-CM

## 2021-02-16 DIAGNOSIS — J449 Chronic obstructive pulmonary disease, unspecified: Secondary | ICD-10-CM | POA: Diagnosis present

## 2021-02-16 DIAGNOSIS — Z955 Presence of coronary angioplasty implant and graft: Secondary | ICD-10-CM

## 2021-02-16 DIAGNOSIS — I10 Essential (primary) hypertension: Secondary | ICD-10-CM

## 2021-02-16 DIAGNOSIS — I517 Cardiomegaly: Secondary | ICD-10-CM | POA: Diagnosis not present

## 2021-02-16 DIAGNOSIS — R0602 Shortness of breath: Secondary | ICD-10-CM | POA: Diagnosis not present

## 2021-02-16 DIAGNOSIS — E1122 Type 2 diabetes mellitus with diabetic chronic kidney disease: Secondary | ICD-10-CM | POA: Diagnosis present

## 2021-02-16 DIAGNOSIS — R079 Chest pain, unspecified: Secondary | ICD-10-CM | POA: Diagnosis not present

## 2021-02-16 DIAGNOSIS — Z96642 Presence of left artificial hip joint: Secondary | ICD-10-CM | POA: Diagnosis present

## 2021-02-16 DIAGNOSIS — R0902 Hypoxemia: Secondary | ICD-10-CM | POA: Diagnosis not present

## 2021-02-16 DIAGNOSIS — Z882 Allergy status to sulfonamides status: Secondary | ICD-10-CM

## 2021-02-16 DIAGNOSIS — I493 Ventricular premature depolarization: Secondary | ICD-10-CM | POA: Diagnosis present

## 2021-02-16 DIAGNOSIS — D649 Anemia, unspecified: Secondary | ICD-10-CM

## 2021-02-16 DIAGNOSIS — N179 Acute kidney failure, unspecified: Secondary | ICD-10-CM | POA: Diagnosis not present

## 2021-02-16 DIAGNOSIS — Z7982 Long term (current) use of aspirin: Secondary | ICD-10-CM

## 2021-02-16 DIAGNOSIS — Z7951 Long term (current) use of inhaled steroids: Secondary | ICD-10-CM

## 2021-02-16 DIAGNOSIS — G8929 Other chronic pain: Secondary | ICD-10-CM | POA: Diagnosis not present

## 2021-02-16 DIAGNOSIS — Z888 Allergy status to other drugs, medicaments and biological substances status: Secondary | ICD-10-CM

## 2021-02-16 DIAGNOSIS — Z885 Allergy status to narcotic agent status: Secondary | ICD-10-CM

## 2021-02-16 DIAGNOSIS — I25119 Atherosclerotic heart disease of native coronary artery with unspecified angina pectoris: Secondary | ICD-10-CM

## 2021-02-16 LAB — CBC
HCT: 35 % — ABNORMAL LOW (ref 36.0–46.0)
Hemoglobin: 11.2 g/dL — ABNORMAL LOW (ref 12.0–15.0)
MCH: 31.3 pg (ref 26.0–34.0)
MCHC: 32 g/dL (ref 30.0–36.0)
MCV: 97.8 fL (ref 80.0–100.0)
Platelets: 270 10*3/uL (ref 150–400)
RBC: 3.58 MIL/uL — ABNORMAL LOW (ref 3.87–5.11)
RDW: 12.5 % (ref 11.5–15.5)
WBC: 8 10*3/uL (ref 4.0–10.5)
nRBC: 0 % (ref 0.0–0.2)

## 2021-02-16 LAB — BASIC METABOLIC PANEL
Anion gap: 8 (ref 5–15)
BUN: 16 mg/dL (ref 8–23)
CO2: 26 mmol/L (ref 22–32)
Calcium: 8.8 mg/dL — ABNORMAL LOW (ref 8.9–10.3)
Chloride: 105 mmol/L (ref 98–111)
Creatinine, Ser: 1.15 mg/dL — ABNORMAL HIGH (ref 0.44–1.00)
GFR, Estimated: 49 mL/min — ABNORMAL LOW (ref >60)
Glucose, Bld: 94 mg/dL (ref 70–99)
Potassium: 4.4 mmol/L (ref 3.5–5.1)
Sodium: 139 mmol/L (ref 135–145)

## 2021-02-16 LAB — TROPONIN I (HIGH SENSITIVITY)
Troponin I (High Sensitivity): 9 ng/L (ref <18)
Troponin I (High Sensitivity): 12 ng/L (ref <18)

## 2021-02-16 LAB — HEMOGLOBIN A1C
Hgb A1c MFr Bld: 6.1 % — ABNORMAL HIGH (ref 4.8–5.6)
Mean Plasma Glucose: 128.37 mg/dL

## 2021-02-16 LAB — HEPARIN LEVEL (UNFRACTIONATED): Heparin Unfractionated: 0.38 IU/mL (ref 0.30–0.70)

## 2021-02-16 MED ORDER — VITAMIN D 25 MCG (1000 UNIT) PO TABS
2000.0000 [IU] | ORAL_TABLET | Freq: Every day | ORAL | Status: DC
  Administered 2021-02-17 – 2021-02-18 (×2): 2000 [IU] via ORAL
  Filled 2021-02-16 (×2): qty 2

## 2021-02-16 MED ORDER — ONDANSETRON HCL 4 MG/2ML IJ SOLN: 4.0000 mg | Freq: Four times a day (QID) | INTRAMUSCULAR | Status: DC | PRN

## 2021-02-16 MED ORDER — ZOLPIDEM TARTRATE 5 MG PO TABS: 5.0000 mg | ORAL_TABLET | Freq: Every evening | ORAL | Status: DC | PRN

## 2021-02-16 MED ORDER — ASPIRIN EC 81 MG PO TBEC
81.0000 mg | DELAYED_RELEASE_TABLET | Freq: Every day | ORAL | Status: DC
  Administered 2021-02-17 – 2021-02-18 (×2): 81 mg via ORAL
  Filled 2021-02-16 (×2): qty 1

## 2021-02-16 MED ORDER — ROSUVASTATIN CALCIUM 20 MG PO TABS
20.0000 mg | ORAL_TABLET | Freq: Every evening | ORAL | Status: DC
  Administered 2021-02-16 – 2021-02-17 (×2): 20 mg via ORAL
  Filled 2021-02-16 (×2): qty 1

## 2021-02-16 MED ORDER — ASPIRIN 300 MG RE SUPP
300.0000 mg | RECTAL | Status: AC
  Filled 2021-02-16: qty 1

## 2021-02-16 MED ORDER — MOMETASONE FURO-FORMOTEROL FUM 200-5 MCG/ACT IN AERO
2.0000 | INHALATION_SPRAY | Freq: Two times a day (BID) | RESPIRATORY_TRACT | Status: DC
  Administered 2021-02-18: 2 via RESPIRATORY_TRACT
  Filled 2021-02-16 (×2): qty 8.8

## 2021-02-16 MED ORDER — ACETAMINOPHEN 325 MG PO TABS
650.0000 mg | ORAL_TABLET | ORAL | Status: DC | PRN
  Administered 2021-02-17 – 2021-02-18 (×2): 650 mg via ORAL
  Filled 2021-02-16 (×2): qty 2

## 2021-02-16 MED ORDER — MONTELUKAST SODIUM 10 MG PO TABS
10.0000 mg | ORAL_TABLET | Freq: Every day | ORAL | Status: DC
  Administered 2021-02-16 – 2021-02-17 (×2): 10 mg via ORAL
  Filled 2021-02-16 (×3): qty 1

## 2021-02-16 MED ORDER — HEPARIN (PORCINE) 25000 UT/250ML-% IV SOLN
1050.0000 [IU]/h | INTRAVENOUS | Status: DC
  Administered 2021-02-16 – 2021-02-17 (×2): 950 [IU]/h via INTRAVENOUS
  Filled 2021-02-16 (×2): qty 250

## 2021-02-16 MED ORDER — HEPARIN BOLUS VIA INFUSION
4000.0000 [IU] | Freq: Once | INTRAVENOUS | Status: AC
  Administered 2021-02-16: 4000 [IU] via INTRAVENOUS
  Filled 2021-02-16: qty 4000

## 2021-02-16 MED ORDER — VITAMIN B-12 100 MCG PO TABS
500.0000 ug | ORAL_TABLET | Freq: Every day | ORAL | Status: DC
  Administered 2021-02-17 – 2021-02-18 (×2): 500 ug via ORAL
  Filled 2021-02-16: qty 1
  Filled 2021-02-16: qty 5

## 2021-02-16 MED ORDER — ALBUTEROL SULFATE HFA 108 (90 BASE) MCG/ACT IN AERS: 1.0000 | INHALATION_SPRAY | RESPIRATORY_TRACT | Status: DC | PRN

## 2021-02-16 MED ORDER — OLOPATADINE HCL 0.1 % OP SOLN
1.0000 [drp] | Freq: Two times a day (BID) | OPHTHALMIC | Status: DC
  Administered 2021-02-17: 1 [drp] via OPHTHALMIC
  Filled 2021-02-16 (×2): qty 5

## 2021-02-16 MED ORDER — METOPROLOL TARTRATE 12.5 MG HALF TABLET
12.5000 mg | ORAL_TABLET | Freq: Two times a day (BID) | ORAL | Status: DC
  Administered 2021-02-16 – 2021-02-18 (×3): 12.5 mg via ORAL
  Filled 2021-02-16 (×4): qty 1

## 2021-02-16 MED ORDER — NITROGLYCERIN IN D5W 200-5 MCG/ML-% IV SOLN
0.0000 ug/min | INTRAVENOUS | Status: DC
  Administered 2021-02-16: 5 ug/min via INTRAVENOUS
  Administered 2021-02-17: 10 ug/min via INTRAVENOUS
  Filled 2021-02-16 (×2): qty 250

## 2021-02-16 MED ORDER — ASPIRIN 81 MG PO CHEW
324.0000 mg | CHEWABLE_TABLET | ORAL | Status: AC
  Administered 2021-02-16: 324 mg via ORAL
  Filled 2021-02-16: qty 4

## 2021-02-16 MED ORDER — FLUOXETINE HCL 10 MG PO CAPS
10.0000 mg | ORAL_CAPSULE | Freq: Every day | ORAL | Status: DC
  Administered 2021-02-17 – 2021-02-18 (×2): 10 mg via ORAL
  Filled 2021-02-16 (×2): qty 1

## 2021-02-16 MED ORDER — NITROGLYCERIN 0.4 MG SL SUBL: 0.4000 mg | SUBLINGUAL_TABLET | SUBLINGUAL | Status: DC | PRN

## 2021-02-16 MED ORDER — FLUTICASONE PROPIONATE 50 MCG/ACT NA SUSP
2.0000 | Freq: Every day | NASAL | Status: DC
  Administered 2021-02-18: 2 via NASAL
  Filled 2021-02-16 (×3): qty 16

## 2021-02-16 MED ORDER — SODIUM CHLORIDE 0.9% FLUSH
3.0000 mL | Freq: Two times a day (BID) | INTRAVENOUS | Status: DC
  Administered 2021-02-16 – 2021-02-17 (×2): 3 mL via INTRAVENOUS

## 2021-02-16 MED ORDER — GABAPENTIN 100 MG PO CAPS
100.0000 mg | ORAL_CAPSULE | Freq: Every day | ORAL | Status: DC
  Administered 2021-02-16 – 2021-02-17 (×2): 100 mg via ORAL
  Filled 2021-02-16 (×2): qty 1

## 2021-02-16 MED ORDER — ALPRAZOLAM 0.5 MG PO TABS: 0.5000 mg | ORAL_TABLET | Freq: Every day | ORAL | Status: DC | PRN

## 2021-02-16 MED ORDER — ALPRAZOLAM 0.25 MG PO TABS: 0.2500 mg | ORAL_TABLET | Freq: Two times a day (BID) | ORAL | Status: DC | PRN

## 2021-02-16 MED ORDER — SODIUM CHLORIDE 0.9% FLUSH: 3.0000 mL | INTRAVENOUS | Status: DC | PRN

## 2021-02-16 MED ORDER — SODIUM CHLORIDE 0.9 % IV SOLN: 250.0000 mL | INTRAVENOUS | Status: DC | PRN

## 2021-02-16 MED ORDER — PANTOPRAZOLE SODIUM 40 MG PO TBEC
40.0000 mg | DELAYED_RELEASE_TABLET | Freq: Every day | ORAL | Status: DC
  Administered 2021-02-17 – 2021-02-18 (×2): 40 mg via ORAL
  Filled 2021-02-16 (×2): qty 1

## 2021-02-16 MED ORDER — ALBUTEROL SULFATE (2.5 MG/3ML) 0.083% IN NEBU: 2.5000 mg | INHALATION_SOLUTION | RESPIRATORY_TRACT | Status: DC | PRN

## 2021-02-16 MED ORDER — ADULT MULTIVITAMIN W/MINERALS CH
1.0000 | ORAL_TABLET | Freq: Every day | ORAL | Status: DC
  Administered 2021-02-17 – 2021-02-18 (×2): 1 via ORAL
  Filled 2021-02-16 (×2): qty 1

## 2021-02-16 NOTE — ED Provider Notes (Addendum)
[place] EMERGENCY DEPARTMENT Provider Note   CSN: [id number] Arrival date & time: 02/16/21  1400     History Chief Complaint  Patient presents with  . Chest Pain    Donna Williamson is a 75 y.o. female.  Patient reports that 3 days ago, she started having pain in her right scapular region She states that this continued throughout the last few days Yesterday she took her mother to the cancer center and states that it was a very stressful time After she came home, she was resting, and started to feel tightness in the center of her chest that was similar to her myocardial infarction in 2017 She states that this occurred off and on overnight She was seen by her cardiologist today Dr. Radford Pax, who recommended that patient come to the ED for ACS rule out including troponins, started on heparin drip She was given 2 nitroglycerin in route via EMS Patient now reports that her chest pain has resolved She denies any shortness of breath Denies any nausea, diaphoresis She states that she is having some pain that radiates down her right arm Otherwise has been feeling in her normal state of health No fevers States that about 2 weeks ago, she was having some dizziness and had a change in her blood pressure medicine from diltiazem to olmesartan She states that since that time she has had no dizziness, but has been running higher blood pressures        Past Medical History:  Diagnosis Date  . Anemia   . Anginal pain ([place])   . Anxiety   . Arthritis    "feet, knees, back" (05/02/2016)  . Asthma   . CAD (coronary artery disease)    a. 04/2016: NSTEMI 95% stenosis 1st Mrg (s/p DES)  . Chest pain    a. 10/2013 Exercise Myoview: Ef 65%, no ischemia.  . CHF (congestive heart failure) ([place])   . Chronic bronchitis ([place])   . Chronic lower back pain   . COPD (chronic obstructive pulmonary disease) ([place])   . GERD (gastroesophageal reflux disease)   . History of blood  transfusion 1960s   "related to my hysterectomy"  . Hypercholesterolemia   . Hypertension   . Lower extremity edema   . Mitral valve prolapse   . NSTEMI (non-ST elevated myocardial infarction) ([place]) 05/01/2016  . Pneumonia 05/01/2016   "saw trace of slight pneumonia/CT scan" (05/02/2016)  . Seasonal allergies   . Sjogren's syndrome ([place])   . Wheezing     Patient Active Problem List   Diagnosis Date Noted  . Coronary artery disease involving native coronary artery of native heart without angina pectoris 05/09/2016  . NSTEMI (non-ST elevated myocardial infarction) ([place]) 05/02/2016  . Chronic diastolic CHF (congestive heart failure) ([place])   . Hyperglycemia 05/01/2016  . Anemia 05/01/2016  . Anxiety 05/01/2016  . Depression 05/01/2016  . Asthma exacerbation   . Porokeratosis 09/21/2015  . Chest pain 11/29/2013  . HTN (hypertension) 11/29/2013  . Hyperlipidemia 11/29/2013  . GERD (gastroesophageal reflux disease) 11/29/2013    Past Surgical History:  Procedure Laterality Date  . ABDOMINAL HYSTERECTOMY  1960s   "partial"  . CARDIAC CATHETERIZATION N/A 05/02/2016   Procedure: Right/Left Heart Cath and Coronary Angiography;  Surgeon: Jettie Booze, MD;  Location: [facility];  Service: Cardiovascular;  Laterality: N/A;  . CARDIAC CATHETERIZATION N/A 05/02/2016   Procedure: Coronary Stent Intervention;  Surgeon: Jettie Booze, MD;  Location: [facility];  Service: Cardiovascular;  Laterality: N/A;  . CATARACT EXTRACTION W/PHACO Right 09/20/2019   Procedure: CATARACT EXTRACTION PHACO AND INTRAOCULAR LENS PLACEMENT ([place]) RIGHT;  Surgeon: Birder Robson, MD;  Location: [facility];  Service: Ophthalmology;  Laterality: Right;  [place] 01:29.4 CDE 19.28 FLUID PACK LOT # [id number]  . CATARACT EXTRACTION W/PHACO Left 10/15/2019   Procedure: CATARACT EXTRACTION PHACO AND INTRAOCULAR LENS PLACEMENT (I[facility]) LEFT 8.76, 00:48.8;  Surgeon: Birder Robson, MD;  Location: [facility];  Service: Ophthalmology;  Laterality: Left;  . COLONOSCOPY W/ BIOPSIES AND POLYPECTOMY    . CORONARY ANGIOPLASTY WITH STENT PLACEMENT  05/02/2016   "1 stent"  . ESOPHAGOGASTRODUODENOSCOPY    . JOINT REPLACEMENT Left 2007   hip  . LAPAROSCOPIC SALPINGOOPHERECTOMY Bilateral ~ 1990  . TOTAL HIP ARTHROPLASTY Left 2007     OB History   No obstetric history on file.     Family History  Problem Relation Age of Onset  . Hypertension Mother   . Stroke Mother   . Heart disease Father   . Ovarian cancer Sister     Social History   Tobacco Use  . Smoking status: Never Smoker  . Smokeless tobacco: Never Used  Vaping Use  . Vaping Use: Never used  Substance Use Topics  . Alcohol use: No    Alcohol/week: 0.0 standard drinks  . Drug use: No    Home Medications Prior to Admission medications   Medication Sig Start Date End Date Taking? Authorizing Provider  albuterol (PROVENTIL) (2.5 MG/3ML) 0.083% nebulizer solution Take 3 mLs (2.5 mg total) by nebulization every 2 (two) hours as needed for wheezing or shortness of breath. 05/04/16   Hosie Poisson, MD  ALPRAZolam Duanne Moron) 0.5 MG tablet Take 0.5 mg by mouth daily as needed for anxiety.     [provider]  aspirin EC 81 MG tablet Take 81 mg by mouth daily.    [provider]  Cholecalciferol (VITAMIN D3) 2000 UNITS TABS Take 2,000 Units by mouth daily.    [provider]  cyanocobalamin 500 MCG tablet Take 500 mcg by mouth daily.    [provider]  EPIPEN 2-PAK 0.3 MG/0.3ML SOAJ injection Inject 0.3 mg into the skin daily as needed (allergic reaction).  11/01/13   [provider]  estradiol (ESTRACE) 1 MG tablet Take 1 mg by mouth daily.     [provider]  FLUoxetine (PROZAC) 10 MG tablet Take 10 mg by mouth daily.    [provider]  fluticasone (FLONASE) 50 MCG/ACT nasal spray Place 2 sprays into the nose daily.    [provider]  furosemide (LASIX)  40 MG tablet TAKE 1 TABLET(40 MG) BY MOUTH DAILY Patient taking differently: Take 40 mg by mouth daily. 07/10/20   Sherren Mocha, MD  gabapentin (NEURONTIN) 100 MG capsule Take 100 mg by mouth at bedtime.     [provider]  isosorbide mononitrate (IMDUR) 30 MG 24 hr tablet TAKE 1 TABLET(30 MG) BY MOUTH DAILY 02/12/21   Sherren Mocha, MD  metoprolol tartrate (LOPRESSOR) 25 MG tablet Take 0.5 tablets (12.5 mg total) by mouth 2 (two) times daily. 04/21/17   Sherren Mocha, MD  montelukast (SINGULAIR) 10 MG tablet Take 10 mg by mouth at bedtime.    [provider]  Multiple Vitamin (MULTIVITAMIN WITH MINERALS) TABS tablet Take 1 tablet by mouth daily.    [provider]  nitroGLYCERIN (NITROSTAT) 0.4 MG SL tablet Place 1 tablet (0.4 mg total) under the tongue every  5 (five) minutes as needed for chest pain. 06/20/19   Bhagat, Crista Luria, PA  Olopatadine HCl 0.2 % SOLN Place 1 drop into both eyes daily as needed (allergies).    [provider]  omeprazole (PRILOSEC) 40 MG capsule Take 40 mg by mouth daily. 09/07/19   [provider]  Polyvinyl Alcohol (LIQUID TEARS OP) Place 1 drop into both eyes daily as needed (dry eyes).    [provider]  PROAIR HFA 108 (90 BASE) MCG/ACT inhaler Inhale 1-2 puffs into the lungs every 4 (four) hours as needed for wheezing.  11/01/13   [provider]  rosuvastatin (CRESTOR) 20 MG tablet Take 1 tablet (20 mg total) by mouth daily at 6 PM. Patient taking differently: Take 20 mg by mouth every evening. 05/30/16   Bhagat, Bhavinkumar, PA  SYMBICORT 160-4.5 MCG/ACT inhaler Inhale 2 puffs into the lungs 2 (two) times daily. 10/11/18   [provider]    Allergies    Levaquin [levofloxacin], Codeine, Nitrofurantoin, Ciprofloxacin, Penicillins, and Sulfa antibiotics  Review of Systems   Review of Systems  Constitutional: Negative for activity change, appetite change, chills, diaphoresis, fatigue and  fever.  HENT: Negative for congestion.   Eyes: Negative for visual disturbance.  Respiratory: Positive for chest tightness. Negative for cough and shortness of breath.   Cardiovascular: Positive for chest pain. Negative for leg swelling.  Gastrointestinal: Negative for abdominal pain, constipation, diarrhea, nausea and vomiting.  Genitourinary: Negative for difficulty urinating and dysuria.  Musculoskeletal: Positive for back pain (right sided, peri-scapular).  Neurological: Negative for dizziness, syncope, weakness and headaches.    Physical Exam Updated Vital Signs BP (!) 175/57 (BP Location: Right Arm)   Pulse 65   Temp 98.2 F (36.8 C) (Oral)   Resp 20   SpO2 95%   Physical Exam Vitals reviewed.  Constitutional:      General: She is not in acute distress.    Appearance: She is well-developed. She is not ill-appearing, toxic-appearing or diaphoretic.  HENT:     Head: Normocephalic and atraumatic.  Cardiovascular:     Rate and Rhythm: Normal rate and regular rhythm.     Pulses:          Radial pulses are 2+ on the right side and 2+ on the left side.       Dorsalis pedis pulses are 2+ on the right side and 2+ on the left side.     Heart sounds: No murmur heard. No friction rub. No gallop.   Pulmonary:     Effort: Pulmonary effort is normal. No respiratory distress.     Breath sounds: Normal breath sounds.  Chest:     Chest wall: No tenderness.  Abdominal:     Palpations: Abdomen is soft. There is no mass.     Tenderness: There is no abdominal tenderness. There is no guarding or rebound.  Musculoskeletal:     Right lower leg: Edema (trace) present.     Left lower leg: Edema (trace) present.  Skin:    General: Skin is warm and dry.     Capillary Refill: Capillary refill takes less than 2 seconds.  Neurological:     General: No focal deficit present.     Mental Status: She is alert and oriented to person, place, and time.  Psychiatric:        Mood and Affect: Mood  normal.        Behavior: Behavior normal.     ED Results / Procedures /  Treatments   Labs (all labs ordered are listed, but only abnormal results are displayed) Labs Reviewed  BASIC METABOLIC PANEL  CBC  HEPARIN LEVEL (UNFRACTIONATED)  TROPONIN I (HIGH SENSITIVITY)    EKG EKG Interpretation  Date/Time:  Tuesday February 16 2021 14:02:53 EDT Ventricular Rate:  59 PR Interval:  184 QRS Duration: 106 QT Interval:  444 QTC Calculation: 440 R Axis:   24 Text Interpretation: Sinus rhythm No significant change since last tracing Confirmed by Dorie Rank [phone]) on 02/16/2021 3:25:31 PM   Radiology DG Chest Port 1 View  Result Date: 02/16/2021 CLINICAL DATA:  Chest pain short of breath EXAM: PORTABLE CHEST 1 VIEW COMPARISON:  06/11/2018 FINDINGS: Borderline to mild cardiomegaly. No focal opacity, pleural effusion or edema. No pneumothorax. IMPRESSION: No active disease. Borderline to mild cardiomegaly. Electronically Signed   By: Donavan Foil M.D.   On: 02/16/2021 15:21    Procedures Procedures   Medications Ordered in ED Medications  heparin ADULT infusion 100 units/mL (25000 units/214mL) (has no administration in time range)    And  heparin bolus via infusion 4,000 Units (has no administration in time range)    ED Course  I have reviewed the triage vital signs and the nursing notes.  Pertinent labs & imaging results that were available during my care of the patient were reviewed by me and considered in my medical decision making (see chart for details).    MDM Rules/Calculators/A&P                          Patient is a 75 year old female with past medical history of CAD s/p NSTEMI in 2017 with drug-eluting stent placement, HLD, hypertension, COPD, HFpEF, who presents from cardiology office with 1 day of chest tightness, which she describes as similar in character to her NSTEMI in 2017.  She received 2 nitro in route and has had resolution of her symptoms.  Her vitals are  stable aside from mild hypertension.  Dr. Radford Pax recommended placing on IV heparin drip until ACS is ruled out, cycle troponins, and have cardiology see patient for possible admission.  Will not start nitro drip as patient is currently asymptomatic.  Spoke with cardiology, who plan to see patient immediately.  Will obtain troponin, CBC, BMP, chest x-ray.  EKG without STEMI.  Heparin drip order placed.  Patient to be admitted by cardiology.  Final Clinical Impression(s) / ED Diagnoses Final diagnoses:  Unstable angina [facility])    Rx / DC Orders ED Discharge Orders    None       Cleophas Dunker, DO 02/16/21 1519    Maeci Kalbfleisch, Bernita Raisin, DO 02/16/21 1525    Lucrezia Starch, MD 02/17/21 1054

## 2021-02-16 NOTE — Progress Notes (Signed)
ANTICOAGULATION CONSULT NOTE - Initial Consult  Pharmacy Consult for heparin dosing. Indication: chest pain/ACS  Allergies  Allergen Reactions  . Levaquin [Levofloxacin] Anaphylaxis  . Codeine Nausea Only  . Nitrofurantoin Other (See Comments)    unknown VISION CHANGES  . Ciprofloxacin Rash  . Penicillins Rash    Has patient had a PCN reaction causing immediate rash, facial/tongue/throat swelling, SOB or lightheadedness with hypotension: No Has patient had a PCN reaction causing severe rash involving mucus membranes or skin necrosis: No Has patient had a PCN reaction that required hospitalization: No Has patient had a PCN reaction occurring within the last 10 years: No If all of the above answers are "NO", then may proceed with Cephalosporin use.  . Sulfa Antibiotics Rash    Patient Measurements:   Heparin Dosing Weight: 80.6 kg  Vital Signs: Temp: 98.2 F (36.8 C) (04/19 1409) Temp Source: Oral (04/19 1409) BP: 175/57 (04/19 1409) Pulse Rate: 65 (04/19 1409)  Labs: No results for input(s): HGB, HCT, PLT, APTT, LABPROT, INR, HEPARINUNFRC, HEPRLOWMOCWT, CREATININE, CKTOTAL, CKMB, TROPONINIHS in the last 72 hours.  CrCl cannot be calculated (Patient's most recent lab result is older than the maximum 21 days allowed.).   Medical History: Past Medical History:  Diagnosis Date  . Anemia   . Anginal pain ([place])   . Anxiety   . Arthritis    "feet, knees, back" (05/02/2016)  . Asthma   . CAD (coronary artery disease)    a. 04/2016: NSTEMI 95% stenosis 1st Mrg (s/p DES)  . Chest pain    a. 10/2013 Exercise Myoview: Ef 65%, no ischemia.  . CHF (congestive heart failure) ([place])   . Chronic bronchitis ([place])   . Chronic lower back pain   . COPD (chronic obstructive pulmonary disease) ([place])   . GERD (gastroesophageal reflux disease)   . History of blood transfusion 1960s   "related to my hysterectomy"  . Hypercholesterolemia   . Hypertension   . Lower extremity edema   .  Mitral valve prolapse   . NSTEMI (non-ST elevated myocardial infarction) ([place]) 05/01/2016  . Pneumonia 05/01/2016   "saw trace of slight pneumonia/CT scan" (05/02/2016)  . Seasonal allergies   . Sjogren's syndrome ([place])   . Wheezing    Assessment: 75 y.o. female presenting to ED per cardiology clinic recommendation due to radiating chest pain x24 hours. Patient s/p remote non-STEMI in 2017 with cath showing severe stenosis. Chest pain today is similar to her 2017 presentation, thus brought in for rule-out ACS. Not on AC PTA. Pharmacy has been consulted for IV heparin dosing.   Goal of Therapy:  Heparin level 0.3-0.7 units/ml Monitor platelets by anticoagulation protocol: Yes   Plan:  Give 4000 units bolus x 1 Start heparin infusion at 950 units/hr Check anti-Xa level in 6 hours and daily while on heparin Continue to monitor H&H and platelets  Claudina Lick, PharmD PGY1 [facility] Resident 02/16/2021 2:58 PM  Please check [email or web address] for unit-specific pharmacy phone numbers.

## 2021-02-16 NOTE — Progress Notes (Signed)
[place] for heparin dosing. Indication: chest pain/ACS  Labs: Recent Labs    02/16/21 1448 02/16/21 1648 02/16/21 2113  HGB 11.2*  --   --   HCT 35.0*  --   --   PLT 270  --   --   HEPARINUNFRC  --   --  0.38  CREATININE 1.15*  --   --   TROPONINIHS 9 12  --     Assessment: 75 y.o. female presenting to ED per cardiology clinic recommendation due to radiating chest pain x24 hours. Patient s/p remote non-STEMI in 2017 with cath showing severe stenosis. Chest pain today is similar to her 2017 presentation, thus brought in for rule-out ACS. Not on AC PTA. Pharmacy has been consulted for IV heparin dosing.  Initial heparin level 0.38 units/ml  Goal of Therapy:  Heparin level 0.3-0.7 units/ml Monitor platelets by anticoagulation protocol: Yes   Plan:  Continue heparin at 950 units/hr Daily HL and CBC  Thanks for allowing pharmacy to be a part of this patient's care.  Excell Seltzer, PharmD Clinical Pharmacist

## 2021-02-16 NOTE — Telephone Encounter (Signed)
Spoke with patient about chest pain and right shoulder pain that started on Saturday.  Patient relates pain "to stress related to her mother".  Blood pressure has been elevated since Dr. Burt Knack stopped diltiazem and starting benicar.   Patient complains of chest tightness and asked if she had this pain before she states, "when I had my heart attack".  Patient also has asthma and has had the chest tightness when having an asthma attack.  Patient has shortness of breath but states, "I have asthma and COPD".    Patient was asked to palpate right shoulder/arm and asked if this causes the same pain and she stated, "yes".  Patient states she has pain with movement of the right shoulder.  Patient has not taken nitroglycerin but took 2 tylenol before bed and this helped.  Patient is asking if the increase in blood pressure could be related to discontinuing diltiazem and starting benicar.  Patient is coming into the office tomorrow for an echo.  Patient is also scheduled for knee surgery on May 2 and is questioning should she continue with that plan.  Patient reassured to come into the office for the echo tomorrow and that we will forward this information to Dr. Burt Knack and will discuss with DOD, Dr. Radford Pax and call her back if any changes.  Reviewed with Dr. Radford Pax (DOD) and she will see patient in the office today at 1 pm. Informed patient and she is agreeable and appreciative of plan.

## 2021-02-16 NOTE — Progress Notes (Addendum)
Cardiology Office Note:    Date:  02/16/2021   ID:  Donna Williamson, Donna Williamson May 29, 1946, MRN [id number]  PCP:  Crist Infante, MD  Cardiologist:  Sherren Mocha, MD    Referring MD: Crist Infante, MD   Chief Complaint  Patient presents with  . Coronary Artery Disease  . Chest Pain  . Hypertension  . Hyperlipidemia    History of Present Illness:    Donna Williamson is a 75 y.o. female with a hx of with a hx of coronary artery disease status post PCI, hypertension, hyperlipidemia, chronic diastolic heart failure, and stage IIIa chronic kidney disease, presenting for follow-up evaluation.  Patient initially presented in 2017 with non-STEMI. She was found to have severe stenosis of the first OM branch of the circumflex and was treated with a synergy drug-eluting stent. She was noted to have moderate residual stenosis in the LAD with medical therapy recommended. Her last stress test in 2019 demonstrated normal myocardial perfusion with no ischemia. LVEF by nuclear stress test was calculated at 55%.  She was seen by Dr. Burt Knack last month and had been under a lot of stress taking care of her elderly mother with dementia.  At that OV the patient described exertional dyspnea with any moderate level activity.  She was short of breath walking less than 1 city block.  She denied any orthopnea or PND.  She has chronic leg swelling which was unchanged.  She had noticed slight progression of her shortness of breath but no marked change and no chest pain or pressure.  Her cardizem was stopped at that OV and was started on Olmesartan 10mg  daily and repeat echo was ordered.  She has not had her echo done yet.  Today she called into the office with complaints of chest pain with radiation into the right shoulder blade that started in the past 24 hours.  She tells me that the shoulder pain started on Saturday and is reproducible with movements of her arm. Yesterday she started having chest pain in the mid  sternal area that is identical to what she had in 2017 at the time of her stent.  She describes it as a tightness that lasted until she took she took some Tylenol.  The pain prior to that was off and on throughout the day.  She had some mild tightness in her upper chest today that has been present for the past 2 hours.  There is radiation of the chest discomfort into her neck.  There is no diaphoresis or nausea with the pain.  There is no accentuation of her SOB with the pain but has been more fatigued.  She is under a lot of stress with her mother.   Past Medical History:  Diagnosis Date  . Anemia   . Anginal pain ([place])   . Anxiety   . Arthritis    "feet, knees, back" (05/02/2016)  . Asthma   . CAD (coronary artery disease)    a. 04/2016: NSTEMI 95% stenosis 1st Mrg (s/p DES)  . Chest pain    a. 10/2013 Exercise Myoview: Ef 65%, no ischemia.  . CHF (congestive heart failure) ([place])   . Chronic bronchitis ([place])   . Chronic lower back pain   . COPD (chronic obstructive pulmonary disease) ([place])   . GERD (gastroesophageal reflux disease)   . History of blood transfusion 1960s   "related to my hysterectomy"  . Hypercholesterolemia   . Hypertension   . Lower extremity edema   .  Mitral valve prolapse   . NSTEMI (non-ST elevated myocardial infarction) ([place]) 05/01/2016  . Pneumonia 05/01/2016   "saw trace of slight pneumonia/CT scan" (05/02/2016)  . Seasonal allergies   . Sjogren's syndrome ([place])   . Wheezing     Past Surgical History:  Procedure Laterality Date  . ABDOMINAL HYSTERECTOMY  1960s   "partial"  . CARDIAC CATHETERIZATION N/A 05/02/2016   Procedure: Right/Left Heart Cath and Coronary Angiography;  Surgeon: Jettie Booze, MD;  Location: [facility];  Service: Cardiovascular;  Laterality: N/A;  . CARDIAC CATHETERIZATION N/A 05/02/2016   Procedure: Coronary Stent Intervention;  Surgeon: Jettie Booze, MD;  Location: [facility];  Service: Cardiovascular;   Laterality: N/A;  . CATARACT EXTRACTION W/PHACO Right 09/20/2019   Procedure: CATARACT EXTRACTION PHACO AND INTRAOCULAR LENS PLACEMENT ([place]) RIGHT;  Surgeon: Birder Robson, MD;  Location: [facility];  Service: Ophthalmology;  Laterality: Right;  [place] 01:29.4 CDE 19.28 FLUID PACK LOT # [id number] H  . CATARACT EXTRACTION W/PHACO Left 10/15/2019   Procedure: CATARACT EXTRACTION PHACO AND INTRAOCULAR LENS PLACEMENT (IOC) LEFT 8.76, 00:48.8;  Surgeon: Birder Robson, MD;  Location: [facility];  Service: Ophthalmology;  Laterality: Left;  . COLONOSCOPY W/ BIOPSIES AND POLYPECTOMY    . CORONARY ANGIOPLASTY WITH STENT PLACEMENT  05/02/2016   "1 stent"  . ESOPHAGOGASTRODUODENOSCOPY    . JOINT REPLACEMENT Left 2007   hip  . LAPAROSCOPIC SALPINGOOPHERECTOMY Bilateral ~ 1990  . TOTAL HIP ARTHROPLASTY Left 2007    Current Medications: Current Meds  Medication Sig  . albuterol (PROVENTIL) (2.5 MG/3ML) 0.083% nebulizer solution Take 3 mLs (2.5 mg total) by nebulization every 2 (two) hours as needed for wheezing or shortness of breath.  . ALPRAZolam (XANAX) 0.5 MG tablet Take 0.5 mg by mouth daily as needed for anxiety.   Marland Kitchen aspirin EC 81 MG tablet Take 81 mg by mouth daily.  . Cholecalciferol (VITAMIN D3) 2000 UNITS TABS Take 2,000 Units by mouth daily.  . cyanocobalamin 500 MCG tablet Take 500 mcg by mouth daily.  Marland Kitchen EPIPEN 2-PAK 0.3 MG/0.3ML SOAJ injection Inject 0.3 mg into the skin daily as needed (allergic reaction).   Marland Kitchen estradiol (ESTRACE) 1 MG tablet Take 1 mg by mouth daily.   Marland Kitchen FLUoxetine (PROZAC) 10 MG tablet Take 10 mg by mouth daily.  . fluticasone (FLONASE) 50 MCG/ACT nasal spray Place 2 sprays into the nose daily.  . furosemide (LASIX) 40 MG tablet TAKE 1 TABLET(40 MG) BY MOUTH DAILY (Patient taking differently: Take 40 mg by mouth daily.)  . gabapentin (NEURONTIN) 100 MG capsule Take 100 mg by mouth at bedtime.   . isosorbide mononitrate (IMDUR) 30 MG 24 hr tablet TAKE 1 TABLET(30  MG) BY MOUTH DAILY  . metoprolol tartrate (LOPRESSOR) 25 MG tablet Take 0.5 tablets (12.5 mg total) by mouth 2 (two) times daily.  . montelukast (SINGULAIR) 10 MG tablet Take 10 mg by mouth at bedtime.  . Multiple Vitamin (MULTIVITAMIN WITH MINERALS) TABS tablet Take 1 tablet by mouth daily.  . nitroGLYCERIN (NITROSTAT) 0.4 MG SL tablet Place 1 tablet (0.4 mg total) under the tongue every 5 (five) minutes as needed for chest pain.  Marland Kitchen Olopatadine HCl 0.2 % SOLN Place 1 drop into both eyes daily as needed (allergies).  Marland Kitchen omeprazole (PRILOSEC) 40 MG capsule Take 40 mg by mouth daily.  . Polyvinyl Alcohol (LIQUID TEARS OP) Place 1 drop into both eyes daily as needed (dry eyes).  Marland Kitchen PROAIR HFA 108 (90 BASE) MCG/ACT  inhaler Inhale 1-2 puffs into the lungs every 4 (four) hours as needed for wheezing.   . rosuvastatin (CRESTOR) 20 MG tablet Take 1 tablet (20 mg total) by mouth daily at 6 PM. (Patient taking differently: Take 20 mg by mouth every evening.)  . SYMBICORT 160-4.5 MCG/ACT inhaler Inhale 2 puffs into the lungs 2 (two) times daily.     Allergies:   Levaquin [levofloxacin], Codeine, Nitrofurantoin, Ciprofloxacin, Penicillins, and Sulfa antibiotics   Social History   Socioeconomic History  . Marital status: Married    Spouse name: Not on file  . Number of children: Not on file  . Years of education: Not on file  . Highest education level: Not on file  Occupational History  . Not on file  Tobacco Use  . Smoking status: Never Smoker  . Smokeless tobacco: Never Used  Vaping Use  . Vaping Use: Never used  Substance and Sexual Activity  . Alcohol use: No    Alcohol/week: 0.0 standard drinks  . Drug use: No  . Sexual activity: Yes  Other Topics Concern  . Not on file  Social History Narrative   Lives in [place] with [place].  Active but does not routinely exercise.   Social Determinants of Health   Financial Resource Strain: Not on file  Food Insecurity: Not on file  Transportation  Needs: Not on file  Physical Activity: Not on file  Stress: Not on file  Social Connections: Not on file     Family History: The patient's family history includes Heart disease in her father; Hypertension in her mother; Ovarian cancer in her sister; Stroke in her mother.  ROS:   Please see the history of present illness.    ROS  All other systems reviewed and negative.   EKGs/Labs/Other Studies Reviewed:    The following studies were reviewed today:   EKG:  EKG is  ordered today.  The ekg ordered today demonstrates NSR  Recent Labs: No results found for requested labs within last 8760 hours.   Recent Lipid Panel    Component Value Date/Time   CHOL 132 08/09/2016 0738   TRIG 201 (H) 08/09/2016 0738   HDL 42 (L) 08/09/2016 0738   CHOLHDL 3.1 08/09/2016 0738   VLDL 40 (H) 08/09/2016 0738   LDLCALC 50 08/09/2016 0738    Physical Exam:    VS:  BP (!) 142/76   Pulse 76   Ht 5\' 5"  (1.651 m)   Wt 226 lb (102.5 kg)   SpO2 90%   BMI 37.61 kg/m     Wt Readings from Last 3 Encounters:  02/16/21 226 lb (102.5 kg)  01/22/21 229 lb 3.2 oz (104 kg)  01/16/20 228 lb (103.4 kg)     GEN:  Well nourished, well developed in no acute distress HEENT: Normal NECK: No JVD; No carotid bruits LYMPHATICS: No lymphadenopathy CARDIAC: RRR, no murmurs, rubs, gallops RESPIRATORY:  Clear to auscultation without rales, wheezing or rhonchi  ABDOMEN: Soft, non-tender, non-distended MUSCULOSKELETAL:  No edema; No deformity  SKIN: Warm and dry NEUROLOGIC:  Alert and oriented x 3 PSYCHIATRIC:  Normal affect   ASSESSMENT:    1. Coronary artery disease involving native coronary artery of native heart without angina pectoris   2. Chronic diastolic CHF (congestive heart failure) ([place])   3. Primary hypertension   4. Pure hypercholesterolemia    PLAN:    In order of problems listed above:  ASCAD/Chest pain -s/p remote non-STEMI 2017 with cath showing severe stenosis of  the first OM  branch of the circumflex s/p synergy drug-eluting stent. She was noted to have moderate residual stenosis in the LAD with medical therapy recommended.  -Her last stress test in 2019 demonstrated normal myocardial perfusion with no ischemia. LVEF by nuclear stress test was calculated at 55%. -now having chest pain that is somewhat atypical and radiates into the right side along with right shoulder pain.  The shoulder pain is worse with movement and I do not think that is related to her CAD -I am concerned about the chest tightness that she has been having.  -the CP is similar to what she had in 2017 when she had her PCI and her pain has been constant for several hours today.  -I think we need to send her to the ER today given her ongoing chest pain to rule out ACS>>:EKG does not show any ST elevation -will admit via the ER to [facility] Cards service -cycle hsTrop -start IV heparin gtt until ACS rule out -if enzymes neg then consider Lexiscan myoview vs. Cardiac cath (she is having knee surgery in May so needs definitive ischemic workup even if enzymes negative) -continue ASA, BB, statin -start IV NTG gtt for CP -NPO after MN -given her CAD and advanced age>>recommend stopping estrogen replacement  Chronic diastolic CHF -she does not appear volume overloaded on exam today -she takes lasix 40mg  daily>>if she gets cath would hold in am  HTN -BP is adequately controlled on exam today -continue Lopressor 12.5mg  BID  HLD -LDL goal < 70 -LDL was 68  -continue Crestor 20mg  daily    Medication Adjustments/Labs and Tests Ordered: Current medicines are reviewed at length with the patient today.  Concerns regarding medicines are outlined above.  Orders Placed This Encounter  Procedures  . EKG 12-Lead   No orders of the defined types were placed in this encounter.   Signed, Fransico Him, MD  02/16/2021 1:02 PM    [place]

## 2021-02-16 NOTE — H&P (Addendum)
Cardiology H&P Note   Date:  02/16/2021   ID:  Donna Williamson, Donna Williamson [birth year], MRN [id number]  PCP:  Crist Infante, MD  Cardiologist:  Sherren Mocha, MD    Referring MD: Crist Infante, MD   Chief Complaint  Patient presents with  . Coronary Artery Disease  . Chest Pain  . Hypertension  . Hyperlipidemia    History of Present Illness:    Donna Williamson is a 75 y.o. female with a hx of with a hx of coronary artery disease status post PCI, hypertension, hyperlipidemia, chronic diastolic heart failure, and stage IIIa chronic kidney disease, presenting for follow-up evaluation.  Patient initially presented in 2017 with non-STEMI. She was found to have severe stenosis of the first OM branch of the circumflex and was treated with a synergy drug-eluting stent. She was noted to have moderate residual stenosis in the LAD with medical therapy recommended. Her last stress test in 2019 demonstrated normal myocardial perfusion with no ischemia. LVEF by nuclear stress test was calculated at 55%.  She was seen by Dr. Burt Knack last month and had been under a lot of stress taking care of her elderly mother with dementia.  At that OV the patient described exertional dyspnea with any moderate level activity.  She was short of breath walking less than 1 city block.  She denied any orthopnea or PND.  She has chronic leg swelling which was unchanged.  She had noticed slight progression of her shortness of breath but no marked change and no chest pain or pressure.  Her cardizem was stopped at that OV and was started on Olmesartan 10mg  daily and repeat echo was ordered.  She has not had her echo done yet.  Today she called into the office with complaints of chest pain with radiation into the right shoulder blade that started in the past 24 hours.  She tells me that the shoulder pain started on Saturday and is reproducible with movements of her arm. Yesterday she started having chest pain in the mid sternal  area that is identical to what she had in 2017 at the time of her stent.  She describes it as a tightness that lasted until she took she took some Tylenol.  The pain prior to that was off and on throughout the day.  She had some mild tightness in her upper chest today that has been present for the past 2 hours.  There is radiation of the chest discomfort into her neck.  There is no diaphoresis or nausea with the pain.  There is no accentuation of her SOB with the pain but has been more fatigued.  She is under a lot of stress with her mother.   Past Medical History:  Diagnosis Date  . Anemia   . Anginal pain ([place])   . Anxiety   . Arthritis    "feet, knees, back" (05/02/2016)  . Asthma   . CAD (coronary artery disease)    a. 04/2016: NSTEMI 95% stenosis 1st Mrg (s/p DES)  . Chest pain    a. 10/2013 Exercise Myoview: Ef 65%, no ischemia.  . CHF (congestive heart failure) ([place])   . Chronic bronchitis ([place])   . Chronic lower back pain   . COPD (chronic obstructive pulmonary disease) ([place])   . GERD (gastroesophageal reflux disease)   . History of blood transfusion 1960s   "related to my hysterectomy"  . Hypercholesterolemia   . Hypertension   . Lower extremity edema   .  Mitral valve prolapse   . NSTEMI (non-ST elevated myocardial infarction) ([place]) 05/01/2016  . Pneumonia 05/01/2016   "saw trace of slight pneumonia/CT scan" (05/02/2016)  . Seasonal allergies   . Sjogren's syndrome ([place])   . Wheezing     Past Surgical History:  Procedure Laterality Date  . ABDOMINAL HYSTERECTOMY  1960s   "partial"  . CARDIAC CATHETERIZATION N/A 05/02/2016   Procedure: Right/Left Heart Cath and Coronary Angiography;  Surgeon: Jettie Booze, MD;  Location: [facility];  Service: Cardiovascular;  Laterality: N/A;  . CARDIAC CATHETERIZATION N/A 05/02/2016   Procedure: Coronary Stent Intervention;  Surgeon: Jettie Booze, MD;  Location: [facility];  Service: Cardiovascular;  Laterality: N/A;   . CATARACT EXTRACTION W/PHACO Right 09/20/2019   Procedure: CATARACT EXTRACTION PHACO AND INTRAOCULAR LENS PLACEMENT ([place]) RIGHT;  Surgeon: Birder Robson, MD;  Location: [facility];  Service: Ophthalmology;  Laterality: Right;  [place] 01:29.4 CDE 19.28 FLUID PACK LOT # [id number] H  . CATARACT EXTRACTION W/PHACO Left 10/15/2019   Procedure: CATARACT EXTRACTION PHACO AND INTRAOCULAR LENS PLACEMENT (IOC) LEFT 8.76, 00:48.8;  Surgeon: Birder Robson, MD;  Location: [facility];  Service: Ophthalmology;  Laterality: Left;  . COLONOSCOPY W/ BIOPSIES AND POLYPECTOMY    . CORONARY ANGIOPLASTY WITH STENT PLACEMENT  05/02/2016   "1 stent"  . ESOPHAGOGASTRODUODENOSCOPY    . JOINT REPLACEMENT Left 2007   hip  . LAPAROSCOPIC SALPINGOOPHERECTOMY Bilateral ~ 1990  . TOTAL HIP ARTHROPLASTY Left 2007    Current Medications: Current Meds  Medication Sig  . albuterol (PROVENTIL) (2.5 MG/3ML) 0.083% nebulizer solution Take 3 mLs (2.5 mg total) by nebulization every 2 (two) hours as needed for wheezing or shortness of breath.  . ALPRAZolam (XANAX) 0.5 MG tablet Take 0.5 mg by mouth daily as needed for anxiety.   Marland Kitchen aspirin EC 81 MG tablet Take 81 mg by mouth daily.  . Cholecalciferol (VITAMIN D3) 2000 UNITS TABS Take 2,000 Units by mouth daily.  . cyanocobalamin 500 MCG tablet Take 500 mcg by mouth daily.  Marland Kitchen EPIPEN 2-PAK 0.3 MG/0.3ML SOAJ injection Inject 0.3 mg into the skin daily as needed (allergic reaction).   Marland Kitchen estradiol (ESTRACE) 1 MG tablet Take 1 mg by mouth daily.   Marland Kitchen FLUoxetine (PROZAC) 10 MG tablet Take 10 mg by mouth daily.  . fluticasone (FLONASE) 50 MCG/ACT nasal spray Place 2 sprays into the nose daily.  . furosemide (LASIX) 40 MG tablet TAKE 1 TABLET(40 MG) BY MOUTH DAILY (Patient taking differently: Take 40 mg by mouth daily.)  . gabapentin (NEURONTIN) 100 MG capsule Take 100 mg by mouth at bedtime.   . isosorbide mononitrate (IMDUR) 30 MG 24 hr tablet TAKE 1 TABLET(30 MG) BY MOUTH  DAILY  . metoprolol tartrate (LOPRESSOR) 25 MG tablet Take 0.5 tablets (12.5 mg total) by mouth 2 (two) times daily.  . montelukast (SINGULAIR) 10 MG tablet Take 10 mg by mouth at bedtime.  . Multiple Vitamin (MULTIVITAMIN WITH MINERALS) TABS tablet Take 1 tablet by mouth daily.  . nitroGLYCERIN (NITROSTAT) 0.4 MG SL tablet Place 1 tablet (0.4 mg total) under the tongue every 5 (five) minutes as needed for chest pain.  Marland Kitchen Olopatadine HCl 0.2 % SOLN Place 1 drop into both eyes daily as needed (allergies).  Marland Kitchen omeprazole (PRILOSEC) 40 MG capsule Take 40 mg by mouth daily.  . Polyvinyl Alcohol (LIQUID TEARS OP) Place 1 drop into both eyes daily as needed (dry eyes).  Marland Kitchen PROAIR HFA 108 (90 BASE) MCG/ACT  inhaler Inhale 1-2 puffs into the lungs every 4 (four) hours as needed for wheezing.   . rosuvastatin (CRESTOR) 20 MG tablet Take 1 tablet (20 mg total) by mouth daily at 6 PM. (Patient taking differently: Take 20 mg by mouth every evening.)  . SYMBICORT 160-4.5 MCG/ACT inhaler Inhale 2 puffs into the lungs 2 (two) times daily.     Allergies:   Levaquin [levofloxacin], Codeine, Nitrofurantoin, Ciprofloxacin, Penicillins, and Sulfa antibiotics   Social History   Socioeconomic History  . Marital status: Married    Spouse name: Not on file  . Number of children: Not on file  . Years of education: Not on file  . Highest education level: Not on file  Occupational History  . Not on file  Tobacco Use  . Smoking status: Never Smoker  . Smokeless tobacco: Never Used  Vaping Use  . Vaping Use: Never used  Substance and Sexual Activity  . Alcohol use: No    Alcohol/week: 0.0 standard drinks  . Drug use: No  . Sexual activity: Yes  Other Topics Concern  . Not on file  Social History Narrative   Lives in [place] with husband.  Active but does not routinely exercise.   Social Determinants of Health   Financial Resource Strain: Not on file  Food Insecurity: Not on file  Transportation Needs: Not on  file  Physical Activity: Not on file  Stress: Not on file  Social Connections: Not on file     Family History: The patient's family history includes Heart disease in her father; Hypertension in her mother; Ovarian cancer in her sister; Stroke in her mother.  ROS:   Please see the history of present illness.    ROS  All other systems reviewed and negative.   EKGs/Labs/Other Studies Reviewed:    The following studies were reviewed today:   EKG:  EKG is  ordered today.  The ekg ordered today demonstrates NSR  Recent Labs: No results found for requested labs within last 8760 hours.   Recent Lipid Panel    Component Value Date/Time   CHOL 132 08/09/2016 0738   TRIG 201 (H) 08/09/2016 0738   HDL 42 (L) 08/09/2016 0738   CHOLHDL 3.1 08/09/2016 0738   VLDL 40 (H) 08/09/2016 0738   LDLCALC 50 08/09/2016 0738    Physical Exam:    VS:  BP (!) 142/76   Pulse 76   Ht 5\' 5"  (1.651 m)   Wt 226 lb (102.5 kg)   SpO2 90%   BMI 37.61 kg/m     Wt Readings from Last 3 Encounters:  02/16/21 226 lb (102.5 kg)  01/22/21 229 lb 3.2 oz (104 kg)  01/16/20 228 lb (103.4 kg)     GEN:  Well nourished, well developed in no acute distress HEENT: Normal NECK: No JVD; No carotid bruits LYMPHATICS: No lymphadenopathy CARDIAC: RRR, no murmurs, rubs, gallops RESPIRATORY:  Clear to auscultation without rales, wheezing or rhonchi  ABDOMEN: Soft, non-tender, non-distended MUSCULOSKELETAL:  No edema; No deformity  SKIN: Warm and dry NEUROLOGIC:  Alert and oriented x 3 PSYCHIATRIC:  Normal affect   ASSESSMENT:    1. Coronary artery disease involving native coronary artery of native heart without angina pectoris   2. Chronic diastolic CHF (congestive heart failure) ([place])   3. Primary hypertension   4. Pure hypercholesterolemia    PLAN:    In order of problems listed above:  ASCAD/Chest pain -s/p remote non-STEMI 2017 with cath showing severe stenosis of  the first OM branch of the  circumflex s/p synergy drug-eluting stent. She was noted to have moderate residual stenosis in the LAD with medical therapy recommended.  -Her last stress test in 2019 demonstrated normal myocardial perfusion with no ischemia. LVEF by nuclear stress test was calculated at 55%. -now having chest pain that is somewhat atypical and radiates into the right side along with right shoulder pain.  The shoulder pain is worse with movement and I do not think that is related to her CAD -I am concerned about the chest tightness that she has been having.  -the CP is similar to what she had in 2017 when she had her PCI and her pain has been constant for several hours today.  -I think we need to send her to the ER today given her ongoing chest pain to rule out ACS>>:EKG does not show any ST elevation -will admit via the ER to [facility] Cards service -cycle hsTrop -start IV heparin gtt until ACS rule out -if enzymes neg then consider Lexiscan myoview vs. Cardiac cath (she is having knee surgery in May so needs definitive ischemic workup even if enzymes negative) -continue ASA, BB, statin -start IV NTG gtt for CP -NPO after MN -given her CAD and advanced age>>recommend stopping estrogen replacement  Chronic diastolic CHF -she does not appear volume overloaded on exam today -she takes lasix 40mg  daily>>if she gets cath would hold in am  HTN -BP is adequately controlled on exam today -continue Lopressor 12.5mg  BID  HLD -LDL goal < 70 -LDL was 68  -continue Crestor 20mg  daily    Medication Adjustments/Labs and Tests Ordered: Current medicines are reviewed at length with the patient today.  Concerns regarding medicines are outlined above.  Orders Placed This Encounter  Procedures  . EKG 12-Lead   No orders of the defined types were placed in this encounter.   Signed, Fransico Him, MD  02/16/2021 1:02 PM    [place]

## 2021-02-16 NOTE — Telephone Encounter (Signed)
Left message to call back  

## 2021-02-16 NOTE — Plan of Care (Signed)
  Problem: Education: Goal: Knowledge of General Education information will improve Description Including pain rating scale, medication(s)/side effects and non-pharmacologic comfort measures Outcome: Progressing   

## 2021-02-16 NOTE — ED Triage Notes (Signed)
BIB EMS from [place]. Chest pain x 3 days that progressingly getting worst. ECG wnl with EMS and at the [facility]. Sent here for stat troponin. Hx of stent in 2017.

## 2021-02-16 NOTE — H&P (Signed)
   See Dr Theodosia Blender office note from today.  That will serve as the H&P.  The Plan is reproduced here.  ASCAD/Chest pain -s/p remote non-STEMI 2017 with cath showing severe stenosis of the first OM branch of the circumflex s/p synergy drug-eluting stent. She was noted to have moderate residual stenosis in the LAD with medical therapy recommended.  -Her last stress test in 2019 demonstrated normal myocardial perfusion with no ischemia. LVEF by nuclear stress test was calculated at 55%. -now having chest pain that is somewhat atypical and radiates into the right side along with right shoulder pain.  The shoulder pain is worse with movement and I do not think that is related to her CAD -I am concerned about the chest tightness that she has been having.  -the CP is similar to what she had in 2017 when she had her PCI and her pain has been constant for several hours today.  -I think we need to send her to the ER today given her ongoing chest pain to rule out ACS>>:EKG does not show any ST elevation -will admit via the ER to [facility] Cards service -cycle hsTrop -start IV heparin gtt until ACS rule out -if enzymes neg then consider Lexiscan myoview vs. Cardiac cath (she is having knee surgery in May so needs definitive ischemic workup even if enzymes negative) -continue ASA, BB, statin -start IV NTG gtt for CP -NPO after MN -given her CAD and advanced age>>recommend stopping estrogen replacement  Chronic diastolic CHF -she does not appear volume overloaded on exam today -she takes lasix 40mg  daily>>if she gets cath would hold in am  HTN -BP is adequately controlled on exam today -continue Lopressor 12.5mg  BID  HLD -LDL goal < 70 -LDL was 68  -continue Crestor 20mg  daily

## 2021-02-16 NOTE — Telephone Encounter (Signed)
Pt c/o of Chest Pain: STAT if CP now or developed within 24 hours  1. Are you having CP right now? In last 24 hrs; discomfort now  2. Are you experiencing any other symptoms (ex. SOB, nausea, vomiting, sweating)? Headache   3. How long have you been experiencing CP? 3 days  4. Is your CP continuous or coming and going? Coming and going  5. Have you taken Nitroglycerin? No   BP last night was 152/77; current BP is 146/77; 59; experienced pain right down shoulder blade ?

## 2021-02-17 ENCOUNTER — Encounter (HOSPITAL_COMMUNITY)
Admission: EM | Disposition: A | Discharge status: Home / Self Care | Payer: Self-pay | Source: Home / Self Care | Attending: Cardiology

## 2021-02-17 ENCOUNTER — Other Ambulatory Visit: Payer: Medicare PPO

## 2021-02-17 ENCOUNTER — Other Ambulatory Visit (HOSPITAL_COMMUNITY): Payer: Medicare PPO

## 2021-02-17 ENCOUNTER — Ambulatory Visit (HOSPITAL_COMMUNITY): Payer: Medicare PPO

## 2021-02-17 DIAGNOSIS — D649 Anemia, unspecified: Secondary | ICD-10-CM

## 2021-02-17 DIAGNOSIS — I25110 Atherosclerotic heart disease of native coronary artery with unstable angina pectoris: Principal | ICD-10-CM

## 2021-02-17 HISTORY — PX: LEFT HEART CATH AND CORONARY ANGIOGRAPHY: CATH118249

## 2021-02-17 HISTORY — PX: CORONARY STENT INTERVENTION: CATH118234

## 2021-02-17 HISTORY — PX: CORONARY PRESSURE/FFR STUDY: CATH118243

## 2021-02-17 LAB — COMPREHENSIVE METABOLIC PANEL
ALT: 15 U/L (ref 0–44)
ALT: 16 U/L (ref 0–44)
AST: 25 U/L (ref 15–41)
AST: 35 U/L (ref 15–41)
Albumin: 3.2 g/dL — ABNORMAL LOW (ref 3.5–5.0)
Albumin: 3.4 g/dL — ABNORMAL LOW (ref 3.5–5.0)
Alkaline Phosphatase: 63 U/L (ref 38–126)
Alkaline Phosphatase: 64 U/L (ref 38–126)
Anion gap: 8 (ref 5–15)
Anion gap: 11 (ref 5–15)
BUN: 15 mg/dL (ref 8–23)
BUN: 12 mg/dL (ref 8–23)
CO2: 26 mmol/L (ref 22–32)
CO2: 24 mmol/L (ref 22–32)
Calcium: 8.7 mg/dL — ABNORMAL LOW (ref 8.9–10.3)
Calcium: 8.7 mg/dL — ABNORMAL LOW (ref 8.9–10.3)
Chloride: 104 mmol/L (ref 98–111)
Chloride: 102 mmol/L (ref 98–111)
Creatinine, Ser: 1.18 mg/dL — ABNORMAL HIGH (ref 0.44–1.00)
Creatinine, Ser: 0.98 mg/dL (ref 0.44–1.00)
GFR, Estimated: 48 mL/min — ABNORMAL LOW (ref >60)
GFR, Estimated: 59 mL/min — ABNORMAL LOW (ref >60)
Glucose, Bld: 94 mg/dL (ref 70–99)
Glucose, Bld: 115 mg/dL — ABNORMAL HIGH (ref 70–99)
Potassium: 3.9 mmol/L (ref 3.5–5.1)
Potassium: 3.6 mmol/L (ref 3.5–5.1)
Sodium: 138 mmol/L (ref 135–145)
Sodium: 137 mmol/L (ref 135–145)
Total Bilirubin: 0.5 mg/dL (ref 0.3–1.2)
Total Bilirubin: 0.3 mg/dL (ref 0.3–1.2)
Total Protein: 6.1 g/dL — ABNORMAL LOW (ref 6.5–8.1)
Total Protein: 6.5 g/dL (ref 6.5–8.1)

## 2021-02-17 LAB — CBC WITH DIFFERENTIAL/PLATELET
Abs Immature Granulocytes: 0.02 10*3/uL (ref 0.00–0.07)
Basophils Absolute: 0 10*3/uL (ref 0.0–0.1)
Basophils Relative: 0 %
Eosinophils Absolute: 0.1 10*3/uL (ref 0.0–0.5)
Eosinophils Relative: 1 %
HCT: 40.1 % (ref 36.0–46.0)
Hemoglobin: 12.7 g/dL (ref 12.0–15.0)
Immature Granulocytes: 0 %
Lymphocytes Relative: 43 %
Lymphs Abs: 3.4 10*3/uL (ref 0.7–4.0)
MCH: 30.7 pg (ref 26.0–34.0)
MCHC: 31.7 g/dL (ref 30.0–36.0)
MCV: 96.9 fL (ref 80.0–100.0)
Monocytes Absolute: 0.4 10*3/uL (ref 0.1–1.0)
Monocytes Relative: 5 %
Neutro Abs: 4.1 10*3/uL (ref 1.7–7.7)
Neutrophils Relative %: 51 %
Platelets: 252 10*3/uL (ref 150–400)
RBC: 4.14 MIL/uL (ref 3.87–5.11)
RDW: 12.6 % (ref 11.5–15.5)
WBC: 8 10*3/uL (ref 4.0–10.5)
nRBC: 0 % (ref 0.0–0.2)

## 2021-02-17 LAB — CBC
HCT: 38.2 % (ref 36.0–46.0)
Hemoglobin: 12 g/dL (ref 12.0–15.0)
MCH: 30.5 pg (ref 26.0–34.0)
MCHC: 31.4 g/dL (ref 30.0–36.0)
MCV: 97.2 fL (ref 80.0–100.0)
Platelets: 284 10*3/uL (ref 150–400)
RBC: 3.93 MIL/uL (ref 3.87–5.11)
RDW: 12.6 % (ref 11.5–15.5)
WBC: 8.5 10*3/uL (ref 4.0–10.5)
nRBC: 0 % (ref 0.0–0.2)

## 2021-02-17 LAB — LIPID PANEL
Cholesterol: 131 mg/dL (ref 0–200)
HDL: 46 mg/dL (ref >40)
LDL Cholesterol: 59 mg/dL (ref 0–99)
Total CHOL/HDL Ratio: 2.8 RATIO
Triglycerides: 132 mg/dL (ref <150)
VLDL: 26 mg/dL (ref 0–40)

## 2021-02-17 LAB — SARS CORONAVIRUS 2 (TAT 6-24 HRS): SARS Coronavirus 2: NEGATIVE

## 2021-02-17 LAB — TROPONIN I (HIGH SENSITIVITY)
Troponin I (High Sensitivity): 9 ng/L (ref <18)
Troponin I (High Sensitivity): 11 ng/L (ref <18)

## 2021-02-17 LAB — GLUCOSE, CAPILLARY: Glucose-Capillary: 93 mg/dL (ref 70–99)

## 2021-02-17 LAB — TSH: TSH: 2.98 u[IU]/mL (ref 0.350–4.500)

## 2021-02-17 LAB — HEPARIN LEVEL (UNFRACTIONATED): Heparin Unfractionated: 0.25 IU/mL — ABNORMAL LOW (ref 0.30–0.70)

## 2021-02-17 LAB — POCT ACTIVATED CLOTTING TIME: Activated Clotting Time: 321 seconds

## 2021-02-17 SURGERY — LEFT HEART CATH AND CORONARY ANGIOGRAPHY
Anesthesia: LOCAL

## 2021-02-17 MED ORDER — FENTANYL CITRATE (PF) 100 MCG/2ML IJ SOLN
INTRAMUSCULAR | Status: DC | PRN
  Administered 2021-02-17 (×2): 25 ug via INTRAVENOUS

## 2021-02-17 MED ORDER — ADENOSINE 6 MG/2ML IV SOLN
INTRAVENOUS | Status: AC
  Filled 2021-02-17: qty 2

## 2021-02-17 MED ORDER — LIDOCAINE HCL (PF) 1 % IJ SOLN
INTRAMUSCULAR | Status: AC
  Filled 2021-02-17: qty 30

## 2021-02-17 MED ORDER — HEPARIN SODIUM (PORCINE) 1000 UNIT/ML IJ SOLN
INTRAMUSCULAR | Status: DC | PRN
  Administered 2021-02-17 (×2): 5000 [IU] via INTRAVENOUS

## 2021-02-17 MED ORDER — SODIUM CHLORIDE 0.9 % IV SOLN: INTRAVENOUS | Status: AC

## 2021-02-17 MED ORDER — TRANEXAMIC ACID-NACL 1000-0.7 MG/100ML-% IV SOLN: 1000.0000 mg | INTRAVENOUS | Status: DC

## 2021-02-17 MED ORDER — FENTANYL CITRATE (PF) 100 MCG/2ML IJ SOLN
INTRAMUSCULAR | Status: AC
  Filled 2021-02-17: qty 2

## 2021-02-17 MED ORDER — SODIUM CHLORIDE 0.9 % IV SOLN: 250.0000 mL | INTRAVENOUS | Status: DC | PRN

## 2021-02-17 MED ORDER — CLOPIDOGREL BISULFATE 300 MG PO TABS
ORAL_TABLET | ORAL | Status: DC | PRN
  Administered 2021-02-17: 600 mg via ORAL

## 2021-02-17 MED ORDER — SODIUM CHLORIDE 0.9% FLUSH: 3.0000 mL | INTRAVENOUS | Status: DC | PRN

## 2021-02-17 MED ORDER — LABETALOL HCL 5 MG/ML IV SOLN
10.0000 mg | INTRAVENOUS | Status: AC | PRN
  Administered 2021-02-17: 10 mg via INTRAVENOUS
  Filled 2021-02-17: qty 4

## 2021-02-17 MED ORDER — GABAPENTIN 300 MG PO CAPS: 300.0000 mg | ORAL_CAPSULE | Freq: Once | ORAL | Status: DC

## 2021-02-17 MED ORDER — CLOPIDOGREL BISULFATE 75 MG PO TABS
75.0000 mg | ORAL_TABLET | Freq: Every day | ORAL | Status: DC
  Administered 2021-02-18: 75 mg via ORAL
  Filled 2021-02-17: qty 1

## 2021-02-17 MED ORDER — SODIUM CHLORIDE 0.9% FLUSH
3.0000 mL | Freq: Two times a day (BID) | INTRAVENOUS | Status: DC
  Administered 2021-02-18: 3 mL via INTRAVENOUS

## 2021-02-17 MED ORDER — NITROGLYCERIN 1 MG/10 ML FOR IR/CATH LAB
INTRA_ARTERIAL | Status: DC | PRN
  Administered 2021-02-17: 200 ug via INTRACORONARY

## 2021-02-17 MED ORDER — CLOPIDOGREL BISULFATE 300 MG PO TABS
ORAL_TABLET | ORAL | Status: AC
  Filled 2021-02-17: qty 2

## 2021-02-17 MED ORDER — POVIDONE-IODINE 10 % EX SWAB: 2.0000 "application " | Freq: Once | CUTANEOUS | Status: DC

## 2021-02-17 MED ORDER — DEXAMETHASONE SODIUM PHOSPHATE 10 MG/ML IJ SOLN: 8.0000 mg | Freq: Once | INTRAMUSCULAR | Status: DC

## 2021-02-17 MED ORDER — HEPARIN (PORCINE) IN NACL 1000-0.9 UT/500ML-% IV SOLN
INTRAVENOUS | Status: DC | PRN
  Administered 2021-02-17 (×2): 500 mL

## 2021-02-17 MED ORDER — AMLODIPINE BESYLATE 5 MG PO TABS
5.0000 mg | ORAL_TABLET | Freq: Every day | ORAL | Status: DC
  Administered 2021-02-17 – 2021-02-18 (×2): 5 mg via ORAL
  Filled 2021-02-17 (×2): qty 1

## 2021-02-17 MED ORDER — MIDAZOLAM HCL 2 MG/2ML IJ SOLN
INTRAMUSCULAR | Status: DC | PRN
  Administered 2021-02-17 (×2): 1 mg via INTRAVENOUS

## 2021-02-17 MED ORDER — MIDAZOLAM HCL 2 MG/2ML IJ SOLN
INTRAMUSCULAR | Status: AC
  Filled 2021-02-17: qty 2

## 2021-02-17 MED ORDER — BUPIVACAINE LIPOSOME 1.3 % IJ SUSP: 20.0000 mL | Freq: Once | INTRAMUSCULAR | Status: DC

## 2021-02-17 MED ORDER — NITROGLYCERIN 1 MG/10 ML FOR IR/CATH LAB
INTRA_ARTERIAL | Status: AC
  Filled 2021-02-17: qty 10

## 2021-02-17 MED ORDER — IOHEXOL 350 MG/ML SOLN
INTRAVENOUS | Status: DC | PRN
  Administered 2021-02-17: 180 mL

## 2021-02-17 MED ORDER — HEPARIN (PORCINE) IN NACL 1000-0.9 UT/500ML-% IV SOLN
INTRAVENOUS | Status: AC
  Filled 2021-02-17: qty 1000

## 2021-02-17 MED ORDER — SODIUM CHLORIDE 0.9% FLUSH
3.0000 mL | Freq: Two times a day (BID) | INTRAVENOUS | Status: DC
  Administered 2021-02-17 – 2021-02-18 (×2): 3 mL via INTRAVENOUS

## 2021-02-17 MED ORDER — HEPARIN (PORCINE) IN NACL 2-0.9 UNITS/ML
INTRAMUSCULAR | Status: DC | PRN
  Administered 2021-02-17: 10 mL via INTRA_ARTERIAL

## 2021-02-17 MED ORDER — HYDRALAZINE HCL 20 MG/ML IJ SOLN
10.0000 mg | INTRAMUSCULAR | Status: AC | PRN
  Administered 2021-02-17: 10 mg via INTRAVENOUS
  Filled 2021-02-17: qty 1

## 2021-02-17 MED ORDER — VERAPAMIL HCL 2.5 MG/ML IV SOLN
INTRAVENOUS | Status: AC
  Filled 2021-02-17: qty 2

## 2021-02-17 MED ORDER — ACETAMINOPHEN 500 MG PO TABS: 1000.0000 mg | ORAL_TABLET | Freq: Once | ORAL | Status: DC

## 2021-02-17 MED ORDER — LIDOCAINE HCL (PF) 1 % IJ SOLN
INTRAMUSCULAR | Status: DC | PRN
  Administered 2021-02-17: 2 mL

## 2021-02-17 MED ORDER — SODIUM CHLORIDE 0.9 % IV SOLN: INTRAVENOUS | Status: DC

## 2021-02-17 MED ORDER — CEFAZOLIN SODIUM-DEXTROSE 2-4 GM/100ML-% IV SOLN: 2.0000 g | INTRAVENOUS | Status: DC

## 2021-02-17 SURGICAL SUPPLY — 20 items
BALLN SAPPHIRE 2.0X12 (BALLOONS) ×2
BALLN SAPPHIRE ~~LOC~~ 2.75X12 (BALLOONS) ×2 IMPLANT
BALLOON SAPPHIRE 2.0X12 (BALLOONS) ×1 IMPLANT
CATH 5FR JL3.5 JR4 ANG PIG MP (CATHETERS) ×2 IMPLANT
CATH VISTA GUIDE 6FR XBLAD3.5 (CATHETERS) ×2 IMPLANT
DEVICE RAD COMP TR BAND LRG (VASCULAR PRODUCTS) ×2 IMPLANT
GLIDESHEATH SLEND SS 6F .021 (SHEATH) ×2 IMPLANT
GUIDEWIRE INQWIRE 1.5J.035X260 (WIRE) ×1 IMPLANT
GUIDEWIRE PRESSURE X 175 (WIRE) ×2 IMPLANT
INQWIRE 1.5J .035X260CM (WIRE) ×2
KIT ENCORE 26 ADVANTAGE (KITS) ×2 IMPLANT
KIT ESSENTIALS PG (KITS) ×2 IMPLANT
KIT HEART LEFT (KITS) ×2 IMPLANT
PACK CARDIAC CATHETERIZATION (CUSTOM PROCEDURE TRAY) ×2 IMPLANT
STENT SYNERGY XD 2.50X16 (Permanent Stent) ×1 IMPLANT
SYNERGY XD 2.50X16 (Permanent Stent) ×2 IMPLANT
SYR MEDRAD MARK 7 150ML (SYRINGE) ×2 IMPLANT
TRANSDUCER W/STOPCOCK (MISCELLANEOUS) ×2 IMPLANT
TUBING CIL FLEX 10 FLL-RA (TUBING) ×2 IMPLANT
WIRE COUGAR XT STRL 190CM (WIRE) ×4 IMPLANT

## 2021-02-17 NOTE — Progress Notes (Signed)
Patient reports cardiology asking about any reactions to blood thinners and at the time forgot about a medical trial of Brilinta in 2017. Patient reports that she experienced a hemorraghic response requiring hospitalization. She was able to tolerate plavix after her stent in 2017

## 2021-02-17 NOTE — Interval H&P Note (Signed)
History and Physical Interval Note:  02/17/2021 2:36 PM  Donna Williamson  has presented today for surgery, with the diagnosis of chest pain - shortness of breath.  The various methods of treatment have been discussed with the patient and family. After consideration of risks, benefits and other options for treatment, the patient has consented to  Procedure(s): LEFT HEART CATH AND CORONARY ANGIOGRAPHY (N/A) as a surgical intervention.  The patient's history has been reviewed, patient examined, no change in status, stable for surgery.  I have reviewed the patient's chart and labs.  Questions were answered to the patient's satisfaction.    Cath Lab Visit (complete for each Cath Lab visit)  Clinical Evaluation Leading to the Procedure:   ACS: No.  Non-ACS:    Anginal Classification: CCS III  Anti-ischemic medical therapy: Minimal Therapy (1 class of medications)  Non-Invasive Test Results: No non-invasive testing performed  Prior CABG: No previous CABG        Lauree Chandler

## 2021-02-17 NOTE — Progress Notes (Signed)
ANTICOAGULATION CONSULT NOTE - Follow Up Consult  Pharmacy Consult for Heparin Indication: chest pain/ACS  Allergies  Allergen Reactions  . Levaquin [Levofloxacin] Anaphylaxis  . Codeine Nausea Only  . Drug Ingredient [Ticagrelor] Other (See Comments)    Patient reports hemorrhage after medical trial of brilinta in 2017  . Nitrofurantoin Other (See Comments)    unknown VISION CHANGES  . Ciprofloxacin Rash  . Penicillins Rash    Has patient had a PCN reaction causing immediate rash, facial/tongue/throat swelling, SOB or lightheadedness with hypotension: No Has patient had a PCN reaction causing severe rash involving mucus membranes or skin necrosis: No Has patient had a PCN reaction that required hospitalization: No Has patient had a PCN reaction occurring within the last 10 years: No If all of the above answers are "NO", then may proceed with Cephalosporin use.  . Sulfa Antibiotics Rash    Patient Measurements: Height: 5\' 5"  (165.1 cm) Weight: 100.6 kg (221 lb 12.5 oz) IBW/kg (Calculated) : 57 Heparin Dosing Weight: 80 kg  Vital Signs: Temp: 97.6 F (36.4 C) (04/20 0821) Temp Source: Oral (04/20 0821) BP: 173/75 (04/20 1247) Pulse Rate: 66 (04/20 0821)  Labs: Recent Labs    02/16/21 1448 02/16/21 1648 02/16/21 2113 02/16/21 2257 02/17/21 0154 02/17/21 1224  HGB 11.2*  --   --   --   --  12.0  HCT 35.0*  --   --   --   --  38.2  PLT 270  --   --   --   --  284  HEPARINUNFRC  --   --  0.38  --   --  0.25*  CREATININE 1.15*  --   --   --  1.18*  --   TROPONINIHS 9 12  --  9 11  --     Estimated Creatinine Clearance: 46.9 mL/min (A) (by C-G formula based on SCr of 1.18 mg/dL (H)).  Assessment: 75 y.o. female presenting to ED on 02/16/21 per cardiology clinic recommendation due to radiating chest pain x24 hours. Patient s/p remote non-STEMI in 2017 with cath showing severe stenosis. Chest pain today is similar to her 2017 presentation, thus brought in for rule-out  ACS. Not on AC PTA. Pharmacy has been consulted for IV heparin dosing.    Heparin level subtherapeutic (0.25) on 950 units/hr, after initial heparin level at goal (0.38) on same rate.  Planning cardiac cath today.  Goal of Therapy:  Heparin level 0.3-0.7 units/ml Monitor platelets by anticoagulation protocol: Yes   Plan:   Increase heparin drip to 1050 units/hr  Follow up post-cath.  Arty Baumgartner, RPh 02/17/2021,1:55 PM

## 2021-02-17 NOTE — Progress Notes (Addendum)
Progress Note  Patient Name: Donna Williamson Date of Encounter: 02/17/2021  Primary Cardiologist: Sherren Mocha, MD  Subjective   Feeling much better this AM. In retrospect she thinks right shoulder blade pain is a separate soreness/MSK pain (started Saturday) from the chest tightness (started yesterday in context of stress). The chest tightness is not worse with exertion, inspiration or palpation but does come about in the setting of emotional stress. It is improved with drinking cold water. It reminds her of prior angina in the quality/location of the pain.  Last O2 sat in Epic recorded at 88% during sleeping hours. Spoke with nurse - this was repeated at bedside with active pulse ox monitoring and she is 97-100% on RA currently.  Of note: 1) she was recently started on Benicar as OP but did not tolerate due to feeling spacey so called our office and stopped it. Recent OP BPs have been running higher recently. 2) she was pending total knee surgery 03/01/21 but she is now considering deferring this given her home obligations  Inpatient Medications    Scheduled Meds: . aspirin EC  81 mg Oral Daily  . cholecalciferol  2,000 Units Oral Daily  . FLUoxetine  10 mg Oral Daily  . fluticasone  2 spray Each Nare Daily  . gabapentin  100 mg Oral QHS  . metoprolol tartrate  12.5 mg Oral BID  . mometasone-formoterol  2 puff Inhalation BID  . montelukast  10 mg Oral QHS  . multivitamin with minerals  1 tablet Oral Daily  . olopatadine  1 drop Both Eyes BID  . pantoprazole  40 mg Oral Daily  . rosuvastatin  20 mg Oral QPM  . sodium chloride flush  3 mL Intravenous Q12H  . vitamin B-12  500 mcg Oral Daily   Continuous Infusions: . sodium chloride    . heparin 950 Units/hr (02/16/21 1546)  . nitroGLYCERIN 10 mcg/min (02/17/21 0030)   PRN Meds: sodium chloride, acetaminophen, albuterol, ALPRAZolam, nitroGLYCERIN, ondansetron (ZOFRAN) IV, sodium chloride flush, zolpidem   Vital Signs     Vitals:   02/17/21 0400 02/17/21 0500 02/17/21 0600 02/17/21 0821  BP: (!) 160/84 (!) 141/73 (!) 146/64   Pulse:    66  Resp: 19 (!) 26 (!) 21   Temp:    97.6 F (36.4 C)  TempSrc:    Oral  SpO2: 95% (!) 88% (!) 88%   Weight:      Height:        Intake/Output Summary (Last 24 hours) at 02/17/2021 0858 Last data filed at 02/17/2021 0827 Gross per 24 hour  Intake 196.16 ml  Output 1150 ml  Net -953.84 ml   Last 3 Weights 02/17/2021 02/16/2021 01/22/2021  Weight (lbs) 221 lb 12.5 oz 226 lb 229 lb 3.2 oz  Weight (kg) 100.6 kg 102.513 kg 103.964 kg     Telemetry    NSR - Personally Reviewed  Physical Exam   GEN: No acute distress.  HEENT: Normocephalic, atraumatic, sclera non-icteric. Neck: No JVD or bruits. Cardiac: RRR no murmurs, rubs, or gallops.  Respiratory: Clear to auscultation bilaterally. Breathing is unlabored. GI: Soft, nontender, non-distended, BS +x 4. MS: no deformity. Extremities: No clubbing or cyanosis. No edema. Distal pedal pulses are 2+ and equal bilaterally. Neuro:  AAOx3. Follows commands. Psych:  Responds to questions appropriately with a normal affect.  Labs    High Sensitivity Troponin:   Recent Labs  Lab 02/16/21 1448 02/16/21 1648 02/16/21 2257 02/17/21 0154  TROPONINIHS  9 12 9 11       Cardiac EnzymesNo results for input(s): TROPONINI in the last 168 hours. No results for input(s): TROPIPOC in the last 168 hours.   Chemistry Recent Labs  Lab 02/16/21 1448 02/17/21 0154  NA 139 138  K 4.4 3.9  CL 105 104  CO2 26 26  GLUCOSE 94 94  BUN 16 15  CREATININE 1.15* 1.18*  CALCIUM 8.8* 8.7*  PROT  --  6.1*  ALBUMIN  --  3.2*  AST  --  25  ALT  --  15  ALKPHOS  --  63  BILITOT  --  0.5  GFRNONAA 49* 48*  ANIONGAP 8 8     Hematology Recent Labs  Lab 02/16/21 1448  WBC 8.0  RBC 3.58*  HGB 11.2*  HCT 35.0*  MCV 97.8  MCH 31.3  MCHC 32.0  RDW 12.5  PLT 270    BNPNo results for input(s): BNP, PROBNP in the last 168  hours.   DDimer No results for input(s): DDIMER in the last 168 hours.   Radiology    DG Chest Port 1 View  Result Date: 02/16/2021 CLINICAL DATA:  Chest pain short of breath EXAM: PORTABLE CHEST 1 VIEW COMPARISON:  06/11/2018 FINDINGS: Borderline to mild cardiomegaly. No focal opacity, pleural effusion or edema. No pneumothorax. IMPRESSION: No active disease. Borderline to mild cardiomegaly. Electronically Signed   By: Donavan Foil M.D.   On: 02/16/2021 15:21    Cardiac Studies   2d echo 2019 - Left ventricle: The cavity size was normal. Systolic function was  normal. The estimated ejection fraction was 55%. Wall motion was  normal; there were no regional wall motion abnormalities. There  was an increased relative contribution of atrial contraction to  ventricular filling. Doppler parameters are consistent with  abnormal left ventricular relaxation (grade 1 diastolic  dysfunction).  - Aortic valve: Trileaflet; mildly thickened, moderately calcified  leaflets.  - Mitral valve: There was trivial regurgitation.  - Left atrium: The atrium was mildly dilated.  - Right ventricle: The cavity size was mildly dilated. Wall  thickness was normal.   Last nuc 12/2019  Nuclear stress EF: 54%.  The left ventricular ejection fraction is mildly decreased (45-54%).  There was no ST segment deviation noted during stress.  There is a small defect of mild severity present in the apex location. The defect is non-reversible.This is consistent with diaphragmatic attenuation artifact and extracardiac uptake. No ischemia noted.  This is a low risk study.     Patient Profile     75 y.o. female with CAD (NSTEMI 2017 s/p DES to OM with residual LAD disease treated medically), HTN, HLD, chronic diastolic CHF, CKD stage IIIa. Last nuc 2019 was low risk. Recently seen in the office under a lot of stress caring for demented elderly mother. Was reported increased dyspnea in context of  decreased activity. Cardizem was stopped, changed to olmesartan, with repeat echo planned. In the meantime, presented to the hospital with chest pain with radiation into the right shoulder blade and neck. She was admitted for further management.  Assessment & Plan    1. Chest pain with recent DOE in the context of known CAD - mixed typical/atypical features  - will discuss nuc vs cath with MD - need to also keep in mind pre-op component possibly for upcoming knee surgery, TBD - elevated BP/stress could also be contributing - obtain echo while inpt (sent message to office to cancel OP echo today)  2. Essential HTN - BP mildly hypertensive - on Lasix 40mg , Imdur 30mg , metoprolol 12.5mg  BID as OP - Lasix on hold for potential cath, Imdur changed to NTG gtt and metoprolol continued - was supposed to start benicar as OP but did not tolerate due to feeling spacey - since diltiazem was recently stopped, consider transition to amlodipine for both BP and anti-anginal effect  3. HLD - continue statin (LDL 59)  4. CKD stage IIIa - Cr near baseline (1.1-1.3)  5. Mild anemia  - appears chronic/waxing waning - no bleeding reported - FOBT ordered  6. Nocturnal desaturations - Pulse ox 88-95% on RA overnight recorded in VS - patient unsure if she snores - would suggest formal sleep study after discharge  For questions or updates, please contact [place] Please consult [email or web address] for contact info under Cardiology/STEMI.  Signed, Charlie Pitter, PA-C 02/17/2021, 8:58 AM    History and all data above reviewed.  Patient examined.  I agree with the findings as above.   No chest pain currently.  However, pain was similar to previous angina.  The patient exam reveals COR:RRR  ,  Lungs: Clear  ,  Abd: Positive bowel sounds, no rebound no guarding, Ext No edema  .  All available labs, radiology testing, previous records reviewed. Agree with documented assessment and plan.   Chest pain:  Consistent  with unstable angina.  Cath is indicated.  The patient understands that risks included but are not limited to stroke (1 in 1000), death (1 in 59), kidney failure [usually temporary] (1 in 500), bleeding (1 in 200), allergic reaction [possibly serious] (1 in 200).  The patient understands and agrees to proceed.   HTN:  Will start Norvasc for BP control.   Jeneen Rinks Jayren Cease  11:30 AM  02/17/2021

## 2021-02-17 NOTE — Progress Notes (Signed)
Patient transferred from cath lab at 1620hrs.  Right radial site with TR band in place, level zero, +2 radial pulse.  Reviewed post cath instructions, patient verbalized understanding.  NTG gtt IV infusing at 15mcg/min, spoke with D. Dunn PA, will continue for tonight and wean in morning if remains pain free.

## 2021-02-17 NOTE — H&P (View-Only) (Signed)
Progress Note  Patient Name: Donna Williamson Date of Encounter: 02/17/2021  Primary Cardiologist: Sherren Mocha, MD  Subjective   Feeling much better this AM. In retrospect she thinks right shoulder blade pain is a separate soreness/MSK pain (started Saturday) from the chest tightness (started yesterday in context of stress). The chest tightness is not worse with exertion, inspiration or palpation but does come about in the setting of emotional stress. It is improved with drinking cold water. It reminds her of prior angina in the quality/location of the pain.  Last O2 sat in Epic recorded at 88% during sleeping hours. Spoke with nurse - this was repeated at bedside with active pulse ox monitoring and she is 97-100% on RA currently.  Of note: 1) she was recently started on Benicar as OP but did not tolerate due to feeling spacey so called our office and stopped it. Recent OP BPs have been running higher recently. 2) she was pending total knee surgery 03/01/21 but she is now considering deferring this given her home obligations  Inpatient Medications    Scheduled Meds: . aspirin EC  81 mg Oral Daily  . cholecalciferol  2,000 Units Oral Daily  . FLUoxetine  10 mg Oral Daily  . fluticasone  2 spray Each Nare Daily  . gabapentin  100 mg Oral QHS  . metoprolol tartrate  12.5 mg Oral BID  . mometasone-formoterol  2 puff Inhalation BID  . montelukast  10 mg Oral QHS  . multivitamin with minerals  1 tablet Oral Daily  . olopatadine  1 drop Both Eyes BID  . pantoprazole  40 mg Oral Daily  . rosuvastatin  20 mg Oral QPM  . sodium chloride flush  3 mL Intravenous Q12H  . vitamin B-12  500 mcg Oral Daily   Continuous Infusions: . sodium chloride    . heparin 950 Units/hr (02/16/21 1546)  . nitroGLYCERIN 10 mcg/min (02/17/21 0030)   PRN Meds: sodium chloride, acetaminophen, albuterol, ALPRAZolam, nitroGLYCERIN, ondansetron (ZOFRAN) IV, sodium chloride flush, zolpidem   Vital Signs     Vitals:   02/17/21 0400 02/17/21 0500 02/17/21 0600 02/17/21 0821  BP: (!) 160/84 (!) 141/73 (!) 146/64   Pulse:    66  Resp: 19 (!) 26 (!) 21   Temp:    97.6 F (36.4 C)  TempSrc:    Oral  SpO2: 95% (!) 88% (!) 88%   Weight:      Height:        Intake/Output Summary (Last 24 hours) at 02/17/2021 0858 Last data filed at 02/17/2021 0827 Gross per 24 hour  Intake 196.16 ml  Output 1150 ml  Net -953.84 ml   Last 3 Weights 02/17/2021 02/16/2021 01/22/2021  Weight (lbs) 221 lb 12.5 oz 226 lb 229 lb 3.2 oz  Weight (kg) 100.6 kg 102.513 kg 103.964 kg     Telemetry    NSR - Personally Reviewed  Physical Exam   GEN: No acute distress.  HEENT: Normocephalic, atraumatic, sclera non-icteric. Neck: No JVD or bruits. Cardiac: RRR no murmurs, rubs, or gallops.  Respiratory: Clear to auscultation bilaterally. Breathing is unlabored. GI: Soft, nontender, non-distended, BS +x 4. MS: no deformity. Extremities: No clubbing or cyanosis. No edema. Distal pedal pulses are 2+ and equal bilaterally. Neuro:  AAOx3. Follows commands. Psych:  Responds to questions appropriately with a normal affect.  Labs    High Sensitivity Troponin:   Recent Labs  Lab 02/16/21 1448 02/16/21 1648 02/16/21 2257 02/17/21 0154  TROPONINIHS  9 12 9 11       Cardiac EnzymesNo results for input(s): TROPONINI in the last 168 hours. No results for input(s): TROPIPOC in the last 168 hours.   Chemistry Recent Labs  Lab 02/16/21 1448 02/17/21 0154  NA 139 138  K 4.4 3.9  CL 105 104  CO2 26 26  GLUCOSE 94 94  BUN 16 15  CREATININE 1.15* 1.18*  CALCIUM 8.8* 8.7*  PROT  --  6.1*  ALBUMIN  --  3.2*  AST  --  25  ALT  --  15  ALKPHOS  --  63  BILITOT  --  0.5  GFRNONAA 49* 48*  ANIONGAP 8 8     Hematology Recent Labs  Lab 02/16/21 1448  WBC 8.0  RBC 3.58*  HGB 11.2*  HCT 35.0*  MCV 97.8  MCH 31.3  MCHC 32.0  RDW 12.5  PLT 270    BNPNo results for input(s): BNP, PROBNP in the last 168  hours.   DDimer No results for input(s): DDIMER in the last 168 hours.   Radiology    DG Chest Port 1 View  Result Date: 02/16/2021 CLINICAL DATA:  Chest pain short of breath EXAM: PORTABLE CHEST 1 VIEW COMPARISON:  06/11/2018 FINDINGS: Borderline to mild cardiomegaly. No focal opacity, pleural effusion or edema. No pneumothorax. IMPRESSION: No active disease. Borderline to mild cardiomegaly. Electronically Signed   By: Donavan Foil M.D.   On: 02/16/2021 15:21    Cardiac Studies   2d echo 2019 - Left ventricle: The cavity size was normal. Systolic function was  normal. The estimated ejection fraction was 55%. Wall motion was  normal; there were no regional wall motion abnormalities. There  was an increased relative contribution of atrial contraction to  ventricular filling. Doppler parameters are consistent with  abnormal left ventricular relaxation (grade 1 diastolic  dysfunction).  - Aortic valve: Trileaflet; mildly thickened, moderately calcified  leaflets.  - Mitral valve: There was trivial regurgitation.  - Left atrium: The atrium was mildly dilated.  - Right ventricle: The cavity size was mildly dilated. Wall  thickness was normal.   Last nuc 12/2019  Nuclear stress EF: 54%.  The left ventricular ejection fraction is mildly decreased (45-54%).  There was no ST segment deviation noted during stress.  There is a small defect of mild severity present in the apex location. The defect is non-reversible.This is consistent with diaphragmatic attenuation artifact and extracardiac uptake. No ischemia noted.  This is a low risk study.     Patient Profile     75 y.o. female with CAD (NSTEMI 2017 s/p DES to OM with residual LAD disease treated medically), HTN, HLD, chronic diastolic CHF, CKD stage IIIa. Last nuc 2019 was low risk. Recently seen in the office under a lot of stress caring for demented elderly mother. Was reported increased dyspnea in context of  decreased activity. Cardizem was stopped, changed to olmesartan, with repeat echo planned. In the meantime, presented to the hospital with chest pain with radiation into the right shoulder blade and neck. She was admitted for further management.  Assessment & Plan    1. Chest pain with recent DOE in the context of known CAD - mixed typical/atypical features  - will discuss nuc vs cath with MD - need to also keep in mind pre-op component possibly for upcoming knee surgery, TBD - elevated BP/stress could also be contributing - obtain echo while inpt (sent message to office to cancel OP echo today)  2. Essential HTN - BP mildly hypertensive - on Lasix 40mg , Imdur 30mg , metoprolol 12.5mg  BID as OP - Lasix on hold for potential cath, Imdur changed to NTG gtt and metoprolol continued - was supposed to start benicar as OP but did not tolerate due to feeling spacey - since diltiazem was recently stopped, consider transition to amlodipine for both BP and anti-anginal effect  3. HLD - continue statin (LDL 59)  4. CKD stage IIIa - Cr near baseline (1.1-1.3)  5. Mild anemia  - appears chronic/waxing waning - no bleeding reported - FOBT ordered  6. Nocturnal desaturations - Pulse ox 88-95% on RA overnight recorded in VS - patient unsure if she snores - would suggest formal sleep study after discharge  For questions or updates, please contact [place] Please consult [email or web address] for contact info under Cardiology/STEMI.  Signed, Charlie Pitter, PA-C 02/17/2021, 8:58 AM    History and all data above reviewed.  Patient examined.  I agree with the findings as above.   No chest pain currently.  However, pain was similar to previous angina.  The patient exam reveals COR:RRR  ,  Lungs: Clear  ,  Abd: Positive bowel sounds, no rebound no guarding, Ext No edema  .  All available labs, radiology testing, previous records reviewed. Agree with documented assessment and plan.   Chest pain:  Consistent  with unstable angina.  Cath is indicated.  The patient understands that risks included but are not limited to stroke (1 in 1000), death (1 in 18), kidney failure [usually temporary] (1 in 500), bleeding (1 in 200), allergic reaction [possibly serious] (1 in 200).  The patient understands and agrees to proceed.   HTN:  Will start Norvasc for BP control.   Donna Williamson  11:30 AM  02/17/2021

## 2021-02-18 ENCOUNTER — Other Ambulatory Visit: Payer: Self-pay | Admitting: *Deleted

## 2021-02-18 ENCOUNTER — Other Ambulatory Visit: Payer: Self-pay | Admitting: Physician Assistant

## 2021-02-18 ENCOUNTER — Encounter (HOSPITAL_COMMUNITY): Payer: Self-pay | Admitting: Cardiovascular Disease

## 2021-02-18 ENCOUNTER — Inpatient Hospital Stay (HOSPITAL_COMMUNITY): Payer: Medicare PPO

## 2021-02-18 DIAGNOSIS — R079 Chest pain, unspecified: Secondary | ICD-10-CM

## 2021-02-18 DIAGNOSIS — I493 Ventricular premature depolarization: Secondary | ICD-10-CM

## 2021-02-18 DIAGNOSIS — N1831 Chronic kidney disease, stage 3a: Secondary | ICD-10-CM

## 2021-02-18 DIAGNOSIS — G4734 Idiopathic sleep related nonobstructive alveolar hypoventilation: Secondary | ICD-10-CM

## 2021-02-18 LAB — BASIC METABOLIC PANEL
Anion gap: 6 (ref 5–15)
BUN: 12 mg/dL (ref 8–23)
CO2: 26 mmol/L (ref 22–32)
Calcium: 8.6 mg/dL — ABNORMAL LOW (ref 8.9–10.3)
Chloride: 107 mmol/L (ref 98–111)
Creatinine, Ser: 1.17 mg/dL — ABNORMAL HIGH (ref 0.44–1.00)
GFR, Estimated: 48 mL/min — ABNORMAL LOW (ref >60)
Glucose, Bld: 100 mg/dL — ABNORMAL HIGH (ref 70–99)
Potassium: 3.8 mmol/L (ref 3.5–5.1)
Sodium: 139 mmol/L (ref 135–145)

## 2021-02-18 LAB — CBC
HCT: 35.2 % — ABNORMAL LOW (ref 36.0–46.0)
Hemoglobin: 11.1 g/dL — ABNORMAL LOW (ref 12.0–15.0)
MCH: 30.5 pg (ref 26.0–34.0)
MCHC: 31.5 g/dL (ref 30.0–36.0)
MCV: 96.7 fL (ref 80.0–100.0)
Platelets: 258 10*3/uL (ref 150–400)
RBC: 3.64 MIL/uL — ABNORMAL LOW (ref 3.87–5.11)
RDW: 12.8 % (ref 11.5–15.5)
WBC: 7 10*3/uL (ref 4.0–10.5)
nRBC: 0 % (ref 0.0–0.2)

## 2021-02-18 LAB — ECHOCARDIOGRAM COMPLETE
AR max vel: 2.19 cm2
AV Peak grad: 11.2 mmHg
Ao pk vel: 1.67 m/s
Area-P 1/2: 3.58 cm2
Height: 65 in
S' Lateral: 3.4 cm
Weight: 3545.6 oz

## 2021-02-18 LAB — MAGNESIUM: Magnesium: 2.2 mg/dL (ref 1.7–2.4)

## 2021-02-18 MED ORDER — NITROGLYCERIN 0.4 MG SL SUBL: 0.4000 mg | SUBLINGUAL_TABLET | SUBLINGUAL | 3 refills | Status: DC | PRN

## 2021-02-18 MED ORDER — AMLODIPINE BESYLATE 5 MG PO TABS: 5.0000 mg | ORAL_TABLET | Freq: Every day | ORAL | 1 refills | Status: DC

## 2021-02-18 MED ORDER — CLOPIDOGREL BISULFATE 75 MG PO TABS: 75.0000 mg | ORAL_TABLET | Freq: Every day | ORAL | 3 refills | Status: DC

## 2021-02-18 MED ORDER — POTASSIUM CHLORIDE CRYS ER 20 MEQ PO TBCR
20.0000 meq | EXTENDED_RELEASE_TABLET | Freq: Once | ORAL | Status: AC
  Administered 2021-02-18: 20 meq via ORAL
  Filled 2021-02-18: qty 1

## 2021-02-18 MED ORDER — PANTOPRAZOLE SODIUM 40 MG PO TBEC: 80.0000 mg | DELAYED_RELEASE_TABLET | Freq: Every day | ORAL | 0 refills | Status: DC

## 2021-02-18 NOTE — Progress Notes (Addendum)
Progress Note  Patient Name: Donna Williamson Date of Encounter: 02/18/2021  Primary Cardiologist: Sherren Mocha, MD  Subjective   Feeling well this AM. Last night had episode of soft BP 107/49 at med pass - NTG gtt discontinued and repeat BP came back up. Low dose metoprolol held. She is hypertensive this AM at 035 systolic and feels well. 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   Inpatient Medications    Scheduled Meds: . amLODipine  5 mg Oral Daily  . aspirin EC  81 mg Oral Daily  . cholecalciferol  2,000 Units Oral Daily  . clopidogrel  75 mg Oral Q breakfast  . FLUoxetine  10 mg Oral Daily  . fluticasone  2 spray Each Nare Daily  . gabapentin  100 mg Oral QHS  . metoprolol tartrate  12.5 mg Oral BID  . mometasone-formoterol  2 puff Inhalation BID  . montelukast  10 mg Oral QHS  . multivitamin with minerals  1 tablet Oral Daily  . olopatadine  1 drop Both Eyes BID  . pantoprazole  40 mg Oral Daily  . rosuvastatin  20 mg Oral QPM  . sodium chloride flush  3 mL Intravenous Q12H  . sodium chloride flush  3 mL Intravenous Q12H  . sodium chloride flush  3 mL Intravenous Q12H  . vitamin B-12  500 mcg Oral Daily   Continuous Infusions: . sodium chloride    . sodium chloride    . nitroGLYCERIN Stopped (02/18/21 0500)   PRN Meds: sodium chloride, sodium chloride, acetaminophen, albuterol, ALPRAZolam, nitroGLYCERIN, ondansetron (ZOFRAN) IV, sodium chloride flush, sodium chloride flush, zolpidem   Vital Signs    Vitals:   02/17/21 2228 02/18/21 0026 02/18/21 0448 02/18/21 0749  BP: (!) 115/50 (!) 136/55 (!) 158/62   Pulse:  75 78   Resp:  16    Temp:  97.8 F (36.6 C) 98.1 F (36.7 C)   TempSrc:  Oral Oral   SpO2:  94% 93% 94%  Weight:   100.5 kg   Height:        Intake/Output Summary (Last 24 hours) at 02/18/2021 4656 Last data filed at 02/17/2021 2151 Gross per 24 hour  Intake 967.95 ml  Output 2200 ml   Net -1232.05 ml   Last 3 Weights 02/18/2021 02/17/2021 02/17/2021  Weight (lbs) 221 lb 9.6 oz 219 lb 12.8 oz 221 lb 12.5 oz  Weight (kg) 100.517 kg 99.701 kg 100.6 kg     Telemetry    NSR occasional PVCs, occasional episodes of ventricular trigeminy - Personally Reviewed  ECG    Post procedure EKG yesterday: NSR without acute changes - Personally Reviewed  Physical Exam   GEN: No acute distress.  HEENT: Normocephalic, atraumatic, sclera non-icteric. Neck: No JVD or bruits. Cardiac: RRR no murmurs, rubs, or gallops.  Respiratory: Clear to auscultation bilaterally. Breathing is unlabored. GI: Soft, nontender, non-distended, BS +x 4. MS: no deformity. Extremities: No clubbing or cyanosis. No edema. Distal pedal pulses are 2+ and equal bilaterally. Right radial cath site without hematoma or ecchymosis; good pulse. Neuro:  AAOx3. Follows commands. Psych:  Responds to questions appropriately with a normal affect.  Labs    High Sensitivity Troponin:   Recent Labs  Lab 02/16/21 1448 02/16/21 1648 02/16/21 2257 02/17/21 0154  TROPONINIHS 9 12 9 11       Cardiac EnzymesNo results for input(s): TROPONINI in the last 168 hours. No results for  input(s): TROPIPOC in the last 168 hours.   Chemistry Recent Labs  Lab 02/17/21 0154 02/17/21 1816 02/18/21 0324  NA 138 137 139  K 3.9 3.6 3.8  CL 104 102 107  CO2 26 24 26   GLUCOSE 94 115* 100*  BUN 15 12 12   CREATININE 1.18* 0.98 1.17*  CALCIUM 8.7* 8.7* 8.6*  PROT 6.1* 6.5  --   ALBUMIN 3.2* 3.4*  --   AST 25 35  --   ALT 15 16  --   ALKPHOS 63 64  --   BILITOT 0.5 0.3  --   GFRNONAA 48* 59* 48*  ANIONGAP 8 11 6      Hematology Recent Labs  Lab 02/17/21 1224 02/17/21 1816 02/18/21 0324  WBC 8.5 8.0 7.0  RBC 3.93 4.14 3.64*  HGB 12.0 12.7 11.1*  HCT 38.2 40.1 35.2*  MCV 97.2 96.9 96.7  MCH 30.5 30.7 30.5  MCHC 31.4 31.7 31.5  RDW 12.6 12.6 12.8  PLT 284 252 258    BNPNo results for input(s): BNP, PROBNP in  the last 168 hours.   DDimer No results for input(s): DDIMER in the last 168 hours.   Radiology    CARDIAC CATHETERIZATION  Result Date: 02/17/2021  Colon Flattery 2nd Diag to 2nd Diag lesion is 70% stenosed.  Previously placed 1st Mrg stent (unknown type) is widely patent.  Prox Cx to Mid Cx lesion is 40% stenosed.  Prox LAD to Mid LAD lesion is 40% stenosed.  2nd Diag lesion is 80% stenosed.  Mid LAD lesion is 80% stenosed.  Post intervention, there is a 80% residual stenosis.  A drug-eluting stent was successfully placed using a SYNERGY XD 2.50X16.  Post intervention, there is a 0% residual stenosis.  1. Moderate, calcified proximal LAD stenosis. 2. Severe mid LAD stenosis at the takeoff of the moderate caliber second Diagonal branch. RFR of the mid LAD lesion was 0.84 suggesting the lesion was flow limiting. The ostium of the moderate caliber second Diagonal branch had a severe stenosis. 3. Patent obtuse marginal stent 4. Non-dominant RCA with mild plaque 5. Successful balloon angioplasty of the ostium of the Diagonal branch. This was done prior to stenting of the LAD. TIMI-3 flow down the Diagonal branch post LAD stenting. 6. Successful PTCA/DES x 1 mid LAD. 7. Preserved LV systolic function. Recommendations: Will continue DAPT with ASA and Plavix for at least six months. Continue statin. I would expect that she will be watched overnight and discharged home tomorrow.    DG Chest Port 1 View  Result Date: 02/16/2021 CLINICAL DATA:  Chest pain short of breath EXAM: PORTABLE CHEST 1 VIEW COMPARISON:  06/11/2018 FINDINGS: Borderline to mild cardiomegaly. No focal opacity, pleural effusion or edema. No pneumothorax. IMPRESSION: No active disease. Borderline to mild cardiomegaly. Electronically Signed   By: Donavan Foil M.D.   On: 02/16/2021 15:21    Cardiac Studies   Cardiac Cath 02/17/21  Ost 2nd Diag to 2nd Diag lesion is 70% stenosed.  Previously placed 1st Mrg stent (unknown type) is widely  patent.  Prox Cx to Mid Cx lesion is 40% stenosed.  Prox LAD to Mid LAD lesion is 40% stenosed.  2nd Diag lesion is 80% stenosed.  Mid LAD lesion is 80% stenosed.  Post intervention, there is a 80% residual stenosis.  A drug-eluting stent was successfully placed using a SYNERGY XD 2.50X16.  Post intervention, there is a 0% residual stenosis.   1. Moderate, calcified proximal LAD stenosis.  2. Severe mid  LAD stenosis at the takeoff of the moderate caliber second Diagonal branch. RFR of the mid LAD lesion was 0.84 suggesting the lesion was flow limiting. The ostium of the moderate caliber second Diagonal branch had a severe stenosis.  3. Patent obtuse marginal stent 4. Non-dominant RCA with mild plaque 5. Successful balloon angioplasty of the ostium of the Diagonal branch. This was done prior to stenting of the LAD. TIMI-3 flow down the Diagonal branch post LAD stenting.  6. Successful PTCA/DES x 1 mid LAD.  7. Preserved LV systolic function.   Recommendations: Will continue DAPT with ASA and Plavix for at least six months. Continue statin. I would expect that she will be watched overnight and discharged home tomorrow.     Last nuc 12/2019  Nuclear stress EF: 54%.  The left ventricular ejection fraction is mildly decreased (45-54%).  There was no ST segment deviation noted during stress.  There is a small defect of mild severity present in the apex location. The defect is non-reversible.This is consistent with diaphragmatic attenuation artifact and extracardiac uptake. No ischemia noted.  This is a low risk study.   Patient Profile     75 y.o. female with CAD (NSTEMI 2017 s/p DES to OM with residual LAD disease treated medically), HTN, HLD, chronic diastolic CHF, CKD stage IIIa. Last nuc 2019 was low risk. Recently seen in the office under a lot of stress caring for demented elderly mother. Was reported increased dyspnea in context of decreased activity. Cardizem was stopped,  changed to olmesartan, with repeat echo planned. In the meantime, presented to the hospital with chest pain with radiation into the right shoulder blade and neck. She was admitted for further management.  Assessment & Plan    1. Chest pain with recent DOE in the context of known CAD - mixed typical/atypical features  - underwent cath 02/17/21 with findings above, s/p DES to severe mLAD stenosis and PTCA of ostial D2, normal LVEF - per cath note, she will need to continue DAPT for at least 6 months - patient was planning to defer upcoming knee surgery anyway - she plans to reach out to ortho to let them know - 2D echo planned this AM  2. Essential HTN - recently started on Benicar but patient stopped due to feeling spacey - amlodipine added this admission - hypertensive this AM but also had soft BP last night so BB held and IV NTG discontinued - will discuss resumption of Lasix + Imdur with MD this AM  3. HLD - continue statin (LDL 59)  4. CKD stage IIIa - Cr slightly up from yesterday but actually closer to baseline today (1.1-1.3)  5. Mild anemia  - appears chronic/waxing waning - no bleeding reported - continue to follow as OP  6. Nocturnal desaturations - Pulse ox 88-95% on RA overnight recorded in VS - patient unsure if she snores - would suggest formal sleep study after discharge given HTN, desats  7. PVCs - noted on telemetry overnight - K 3.8-> will supp (may be related to NPO as PTA K was 4.4) - check Mg - restart BB - plan echo  Anticipate dc home today after echo. Pt does not want to use [facility] pharmacy, so will plan to send rxs in to usual pharmacy. 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  For questions or updates, please contact [place] Please consult [email or web address] for contact info under  Cardiology/STEMI.  Signed, Charlie Pitter, PA-C 02/18/2021, 8:22 AM    History and all data above reviewed.  Patient examined.  I agree with the findings as above.   No further pain.  No SOB.  Creat mildly increased The patient exam reveals COR:RRR  ,  Lungs: Clear  ,  Abd: Positive bowel sounds, no rebound no guarding, Ext Right radial without bleeding and very slight echymosis  .  All available labs, radiology testing, previous records reviewed. Agree with documented assessment and plan.   CAD:  Plans as above.  AKI:  Mild increase in creat.  I would suggest not resuming Lasix or Imdur.  I do agree with the low dose beta blocker and Norvasc as on MAR.  OK to discharge after echo (prelim looks fine.)  Of note there is a small pressure ulcer that is being addressed by our nursing with instructions for home care.    Minus Breeding  9:39 AM  02/18/2021

## 2021-02-18 NOTE — Progress Notes (Signed)
  Echocardiogram 2D Echocardiogram with strain has been performed.  Darlina Sicilian M 02/18/2021, 9:47 AM

## 2021-02-18 NOTE — Discharge Summary (Signed)
Discharge Summary    Patient ID: Donna Williamson MRN: [id number]; DOB: [birth year]  Admit date: 02/16/2021 Discharge date: 02/18/2021  PCP:  Crist Infante, [place]  Cardiologist:  Sherren Mocha, MD  Advanced Practice Provider:  No care team member to display Electrophysiologist:  None [id number]    Discharge Diagnoses    Principal Problem:   Unstable angina [facility]) Active Problems:   HTN (hypertension)   Hyperlipidemia   Mild anemia   CAD in native artery   Chronic kidney disease, stage 3a ([place])   Nocturnal oxygen desaturation   PVC's (premature ventricular contractions)    Diagnostic Studies/Procedures     Cardiac Cath 02/17/21  Ost 2nd Diag to 2nd Diag lesion is 70% stenosed.  Previously placed 1st Mrg stent (unknown type) is widely patent.  Prox Cx to Mid Cx lesion is 40% stenosed.  Prox LAD to Mid LAD lesion is 40% stenosed.  2nd Diag lesion is 80% stenosed.  Mid LAD lesion is 80% stenosed.  Post intervention, there is a 80% residual stenosis.  A drug-eluting stent was successfully placed using a SYNERGY XD 2.50X16.  Post intervention, there is a 0% residual stenosis.  1. Moderate, calcified proximal LAD stenosis.  2. Severe mid LAD stenosis at the takeoff of the moderate caliber second Diagonal branch. RFR of the mid LAD lesion was 0.84 suggesting the lesion was flow limiting. The ostium of the moderate caliber second Diagonal branch had a severe stenosis.  3. Patent obtuse marginal stent 4. Non-dominant RCA with mild plaque 5. Successful balloon angioplasty of the ostium of the Diagonal branch. This was done prior to stenting of the LAD. TIMI-3 flow down the Diagonal branch post LAD stenting.  6. Successful PTCA/DES x 1 mid LAD.  7. Preserved LV systolic function.   Recommendations: Will continue DAPT with ASA and Plavix for at least six months. Continue statin. I would expect that she will be watched overnight and  discharged home tomorrow.   2D echo 02/18/21 1. Normal GLS -17.4. Left ventricular ejection fraction, by estimation,  is 60 to 65%. The left ventricle has normal function. The left ventricle  has no regional wall motion abnormalities. Left ventricular diastolic  parameters were normal.  2. Right ventricular systolic function is normal. The right ventricular  size is normal.  3. Left atrial size was mildly dilated.  4. The mitral valve is abnormal. Trivial mitral valve regurgitation. No  evidence of mitral stenosis.  5. Severe nodular calcification of the left coronary cusp. The aortic  valve is calcified. There is severe calcifcation of the aortic valve.  There is moderate thickening of the aortic valve. Aortic valve  regurgitation is not visualized. No aortic  stenosis is present.  6. The inferior vena cava is normal in size with greater than 50%  respiratory variability, suggesting right atrial pressure of 3 mmHg.   _____________   History of Present Illness     75 y.o.femalewith CAD (NSTEMI 2017 s/p DES to OM with residual LAD disease treated medically), HTN, HLD, chronic diastolic CHF, CKD stage IIIa.Last nuc 2019 was low risk.She was recently seen in the office in March and was under a lot of stress caring for demented elderly mother. She reported increased dyspnea in context of decreased activity. Cardizem was stopped, Benicar was started, with repeat echo planned. This had not yet been done. She called the office complaining of feeling side effects from the [place] so it was stopped (felt "spacey"). In  the meantime she presented back to the office with several day history of right shoulder pain and intermittent chest tightness. The shoulder pain felt like MSK but the chest tightness felt similar to prior angina. She was subsequently sent to the hospital for admission for unstable angina. She was started on IV heparin and IV NTG on arrival.  Hospital Course     1. Chest  pain with recent DOE in the context of known CAD - mixed typical/atypical features with negative troponins - she underwent cath 02/17/21 with findings above, s/p DES to severe mLAD stenosis and PTCA of ostial D2; normal LVEF - started on Plavix in addition to home ASA - per cath note, she will need to continue DAPT for at least 6 months - Prilosec changed to Protonix given interaction with Plavix - recommended she discuss long term plan for PPI with PCP - 2D echo showed EF 25-85%, normal diastolic parameters, mild LAE, nodular calcification of the AV but no significant AI/AS -ok per Dr. Percival Spanish - Of note, patient was planning to defer upcoming knee surgery anyway - she states she reached out to the orthopedic team to let them know she is cancelling  2. Essential HTN - recently started on Benicar as outpatient but patient stopped due to feeling spacey - blood pressure elevated this admission so amlodipine was added - she swung to soft BP last night so BB temporarily held and IV NTG discontinued - she was hypertensive again this morning but feeling well - per Dr. Percival Spanish, continue to hold Lasix and Imdur at discharge, continue amlodipine and metoprolol, and follow up as outpatient - The patient was instructed to monitor their blood pressure at home and to call if tending to run higher than 130/80. Also relayed warning sx on DC instructions regarding signs of fluid retention as well  3. HLD - continue statin (LDL 59)  4. CKD stage IIIa - Cr slightly up from yesterday at 1.17 but actually closer to baseline today (1.1-1.3) - per Dr. Percival Spanish, holding Lasix at discharge  5. Mild anemia  - appears chronic/waxing waning - no bleeding reported - continue to follow as OP  6. Nocturnal desaturations - Pulse ox 88-95% on RA overnight recorded in VS - patient unsure if she snores - would suggest formal sleep study after discharge given HTN, desats - sent message to triage team to help  arrange  7. Occasional PVCs/ventricular tigeminy - noted on telemetry overnight - K 3.8 -> supplemented prior to discharge (may be related to NPO as PTA K was 4.4) - Magnesium WNL - restarted BB this AM and doing well  - f/u as OP  8. 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 She was given these instructions and provided the powder from the hospital at discharge.  Dr. Percival Spanish has seen and examined the patient today and feels she is stable for discharge. Rx were sent to regular pharmacy per pt request. 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 Per our discussion she will return to work in 1 week. We have arranged next available post hospital  follow-up appt.   Did the patient have an acute coronary syndrome (MI, NSTEMI, STEMI, etc) this admission?:  No                               Did the patient have a percutaneous coronary intervention (stent / angioplasty)?:  Yes.     Cath/PCI Registry Performance & Quality Measures: 1. Aspirin prescribed? - Yes 2. ADP Receptor Inhibitor (Plavix/Clopidogrel, Brilinta/Ticagrelor or Effient/Prasugrel) prescribed (includes medically managed patients)? - Yes 3. High Intensity Statin (Lipitor 40-80mg  or Crestor 20-40mg ) prescribed? - Yes 4. For EF <40%, was ACEI/ARB prescribed? - Not Applicable (EF >/= 81%) 5. For EF <40%, Aldosterone Antagonist (Spironolactone or Eplerenone) prescribed? - Not Applicable (EF >/= 44%) 6. Cardiac Rehab Phase II ordered? - Yes      _____________  Discharge Vitals Blood pressure (!) 158/62, pulse 78, temperature 98.1 F  (36.7 C), temperature source Oral, resp. rate 16, height 5\' 5"  (1.651 m), weight 100.5 kg, SpO2 94 %.  Filed Weights   02/17/21 0010 02/17/21 1620 02/18/21 0448  Weight: 100.6 kg 99.7 kg 100.5 kg    Labs & Radiologic Studies    CBC Recent Labs    02/17/21 1816 02/18/21 0324  WBC 8.0 7.0  NEUTROABS 4.1  --   HGB 12.7 11.1*  HCT 40.1 35.2*  MCV 96.9 96.7  PLT 252 818   Basic Metabolic Panel Recent Labs    02/17/21 1816 02/18/21 0324  NA 137 139  K 3.6 3.8  CL 102 107  CO2 24 26  GLUCOSE 115* 100*  BUN 12 12  CREATININE 0.98 1.17*  CALCIUM 8.7* 8.6*  MG  --  2.2   Liver Function Tests Recent Labs    02/17/21 0154 02/17/21 1816  AST 25 35  ALT 15 16  ALKPHOS 63 64  BILITOT 0.5 0.3  PROT 6.1* 6.5  ALBUMIN 3.2* 3.4*   No results for input(s): LIPASE, AMYLASE in the last 72 hours. High Sensitivity Troponin:   Recent Labs  Lab 02/16/21 1448 02/16/21 1648 02/16/21 2257 02/17/21 0154  TROPONINIHS 9 12 9 11     BNP Invalid input(s): POCBNP D-Dimer No results for input(s): DDIMER in the last 72 hours. Hemoglobin A1C Recent Labs    02/16/21 2257  HGBA1C 6.1*   Fasting Lipid Panel Recent Labs    02/17/21 0154  CHOL 131  HDL 46  LDLCALC 59  TRIG 132  CHOLHDL 2.8   Thyroid Function Tests Recent Labs    02/16/21 2257  TSH 2.980   _____________  CARDIAC CATHETERIZATION  Result Date: 02/17/2021  Colon Flattery 2nd Diag to 2nd Diag lesion is 70% stenosed.  Previously placed 1st Mrg stent (unknown type) is widely patent.  Prox Cx to Mid Cx lesion is 40% stenosed.  Prox LAD to Mid LAD lesion is 40% stenosed.  2nd Diag lesion is 80% stenosed.  Mid LAD lesion is 80% stenosed.  Post intervention, there is a 80% residual stenosis.  A drug-eluting stent was successfully placed using a SYNERGY XD 2.50X16.  Post intervention, there is a 0% residual stenosis.  1. Moderate, calcified proximal LAD stenosis. 2. Severe mid LAD stenosis at the takeoff of the moderate  caliber second Diagonal branch. RFR of the mid LAD lesion was 0.84 suggesting the lesion was flow limiting. The ostium of the moderate caliber second Diagonal branch had a severe stenosis. 3. Patent obtuse marginal stent 4. Non-dominant RCA with mild plaque  5. Successful balloon angioplasty of the ostium of the Diagonal branch. This was done prior to stenting of the LAD. TIMI-3 flow down the Diagonal branch post LAD stenting. 6. Successful PTCA/DES x 1 mid LAD. 7. Preserved LV systolic function. Recommendations: Will continue DAPT with ASA and Plavix for at least six months. Continue statin. I would expect that she will be watched overnight and discharged home tomorrow.    DG Chest Port 1 View  Result Date: 02/16/2021 CLINICAL DATA:  Chest pain short of breath EXAM: PORTABLE CHEST 1 VIEW COMPARISON:  06/11/2018 FINDINGS: Borderline to mild cardiomegaly. No focal opacity, pleural effusion or edema. No pneumothorax. IMPRESSION: No active disease. Borderline to mild cardiomegaly. Electronically Signed   By: Donavan Foil M.D.   On: 02/16/2021 15:21   ECHOCARDIOGRAM COMPLETE  Result Date: 02/18/2021    ECHOCARDIOGRAM REPORT   Patient Name:   Donna Williamson Date of Exam: 02/18/2021 Medical Rec #:  [id number]         Height:       65.0 in Accession #:    [id number]        Weight:       221.6 lb Date of Birth:  1945/11/02         BSA:          2.066 m Patient Age:    38 years          BP:           158/53 mmHg Patient Gender: F                 HR:           78 bpm. Exam Location:  Inpatient Procedure: 2D Echo, Cardiac Doppler, Color Doppler and Strain Analysis Indications:    Chest Pain R07.9  History:        Patient has prior history of Echocardiogram examinations, most                 recent 07/25/2018. CHF, CAD, COPD; Risk Factors:Hypertension,                 Dyslipidemia and Former Smoker. GERD.  Sonographer:    Marygrace Drought RCS Referring Phys: [place]  1. Normal GLS -17.4.  Left ventricular ejection fraction, by estimation, is 60 to 65%. The left ventricle has normal function. The left ventricle has no regional wall motion abnormalities. Left ventricular diastolic parameters were normal.  2. Right ventricular systolic function is normal. The right ventricular size is normal.  3. Left atrial size was mildly dilated.  4. The mitral valve is abnormal. Trivial mitral valve regurgitation. No evidence of mitral stenosis.  5. Severe nodular calcification of the left coronary cusp. The aortic valve is calcified. There is severe calcifcation of the aortic valve. There is moderate thickening of the aortic valve. Aortic valve regurgitation is not visualized. No aortic stenosis is present.  6. The inferior vena cava is normal in size with greater than 50% respiratory variability, suggesting right atrial pressure of 3 mmHg. FINDINGS  Left Ventricle: Normal GLS -17.4. Left ventricular ejection fraction, by estimation, is 60 to 65%. The left ventricle has normal function. The left ventricle has no regional wall motion abnormalities. The left ventricular internal cavity size was normal  in size. There is no left ventricular hypertrophy. Left ventricular diastolic parameters were normal. Right Ventricle: The right ventricular size is normal. No increase in right ventricular wall thickness. Right ventricular systolic function  is normal. Left Atrium: Left atrial size was mildly dilated. Right Atrium: Right atrial size was normal in size. Pericardium: There is no evidence of pericardial effusion. Mitral Valve: The mitral valve is abnormal. There is mild thickening of the mitral valve leaflet(s). Mild mitral annular calcification. Trivial mitral valve regurgitation. No evidence of mitral valve stenosis. Tricuspid Valve: The tricuspid valve is normal in structure. Tricuspid valve regurgitation is trivial. No evidence of tricuspid stenosis. Aortic Valve: Severe nodular calcification of the left coronary  cusp. The aortic valve is calcified. There is severe calcifcation of the aortic valve. There is moderate thickening of the aortic valve. Aortic valve regurgitation is not visualized. No aortic stenosis is present. Aortic valve peak gradient measures 11.2 mmHg. Pulmonic Valve: The pulmonic valve was normal in structure. Pulmonic valve regurgitation is not visualized. No evidence of pulmonic stenosis. Aorta: The aortic root is normal in size and structure. Venous: The inferior vena cava is normal in size with greater than 50% respiratory variability, suggesting right atrial pressure of 3 mmHg. IAS/Shunts: No atrial level shunt detected by color flow Doppler.  LEFT VENTRICLE PLAX 2D LVIDd:         5.30 cm  Diastology LVIDs:         3.40 cm  LV e' medial:    7.18 cm/s LV PW:         0.80 cm  LV E/e' medial:  13.0 LV IVS:        1.10 cm  LV e' lateral:   9.68 cm/s LVOT diam:     2.20 cm  LV E/e' lateral: 9.6 LV SV:         74 LV SV Index:   36 LVOT Area:     3.80 cm  RIGHT VENTRICLE RV S prime:     19.50 cm/s TAPSE (M-mode): 3.2 cm LEFT ATRIUM             Index       RIGHT ATRIUM           Index LA diam:        4.20 cm 2.03 cm/m  RA Area:     18.30 cm LA Vol (A2C):   52.7 ml 25.51 ml/m RA Volume:   46.90 ml  22.70 ml/m LA Vol (A4C):   51.0 ml 24.68 ml/m LA Biplane Vol: 52.6 ml 25.46 ml/m  AORTIC VALVE AV Area (Vmax): 2.19 cm AV Vmax:        167.00 cm/s AV Peak Grad:   11.2 mmHg LVOT Vmax:      96.10 cm/s LVOT Vmean:     54.300 cm/s LVOT VTI:       0.195 m  AORTA Ao Root diam: 2.70 cm Ao Asc diam:  2.90 cm MITRAL VALVE MV Area (PHT): 3.58 cm     SHUNTS MV Decel Time: 212 msec     Systemic VTI:  0.20 m MV E velocity: 93.10 cm/s   Systemic Diam: 2.20 cm MV A velocity: 123.00 cm/s MV E/A ratio:  0.76 Jenkins Rouge MD Electronically signed by Jenkins Rouge MD Signature Date/Time: 02/18/2021/10:20:50 AM    Final    Disposition   Pt is being discharged home today in good condition.  Follow-up Plans & Appointments      Follow-up Information    Tommie Raymond, NP Follow up.   Specialty: Cardiology Why: [place] location - a follow-up has been arranged for you on Tuesday Mar 30, 2021 at 2:15 PM (  Arrive by 2:00 PM). Sharee Pimple is one of the nurse practitioners that works with Dr. Burt Knack. Contact information: [place] [phone]              Discharge Instructions    Amb Referral to Cardiac Rehabilitation   Complete by: As directed    Diagnosis:  PTCA Coronary Stents     After initial evaluation and assessments completed: Virtual Based Care may be provided alone or in conjunction with Phase 2 Cardiac Rehab based on patient barriers.: Yes   Diet - low sodium heart healthy   Complete by: As directed    Discharge instructions   Complete by: As directed    You were started on a blood pressure medicine called amlodipine.  You were started on clopidogrel/Plavix to start in addition to aspirin. If you notice any bleeding such as blood in stool, black tarry stools, blood in urine, nosebleeds or any other unusual bleeding, call your doctor immediately. It is not normal to have this kind of bleeding while on a blood thinner and usually indicates there is an underlying problem with one of your body systems that needs to be checked out.   Some studies suggest Prilosec/Omeprazole interacts with Plavix/clopidogrel. We changed your Prilosec/Omeprazole to the equivalent dose of Protonix/Pantoprazole for less chance of interaction. Please contact your primary care doctor to discuss long term plan for this class of medicine.  We sent in a refill of your nitroglycerin as you requested.  Dr. Percival Spanish recommended to stay off your Lasix (furosemide) and isosorbide (Imdur) for now. Do not throw these away as they might be restarted at a later time. Please monitor your blood pressure occasionally at home. Call your doctor if you tend to get readings of greater  than 130 on the top number or 80 on the bottom number. If you notice any weight gain, shortness of breath or swelling, call the office.   Discharge wound care:   Complete by: As directed    Apply antifungal powder (green and white bottle in clean utility) to gluteal cleft AFTER cleansing with soap and water, patting dry.   Increase activity slowly   Complete by: As directed    No driving for 2 days. No lifting over 5 lbs for 1 week. No sexual activity for 1 week. You may return to work in 1 week if feeling well. Keep procedure site clean & dry. If you notice increased pain, swelling, bleeding or pus, call/return!  You may shower, but no soaking baths/hot tubs/pools for 1 week.      Discharge Medications   Allergies as of 02/18/2021      Reactions   Levaquin [levofloxacin] Anaphylaxis   Codeine Nausea Only   Drug Ingredient [ticagrelor] Other (See Comments)   Patient reports hemorrhage after medical trial of brilinta in 2017   Nitrofurantoin Other (See Comments)   unknown VISION CHANGES   Ciprofloxacin Rash   Penicillins Rash   Has patient had a PCN reaction causing immediate rash, facial/tongue/throat swelling, SOB or lightheadedness with hypotension: No Has patient had a PCN reaction causing severe rash involving mucus membranes or skin necrosis: No Has patient had a PCN reaction that required hospitalization: No Has patient had a PCN reaction occurring within the last 10 years: No If all of the above answers are "NO", then may proceed with Cephalosporin use.   Sulfa Antibiotics Rash      Medication List    STOP taking these medications  furosemide 40 MG tablet Commonly known as: LASIX   isosorbide mononitrate 30 MG 24 hr tablet Commonly known as: IMDUR   omeprazole 40 MG capsule Commonly known as: PRILOSEC Replaced by: pantoprazole 40 MG tablet     TAKE these medications   ALPRAZolam 0.5 MG tablet Commonly known as: XANAX Take 0.5 mg by mouth daily as needed for  anxiety.   amLODipine 5 MG tablet Commonly known as: NORVASC Take 1 tablet (5 mg total) by mouth daily. Start taking on: February 19, 2021   aspirin EC 81 MG tablet Take 81 mg by mouth daily.   clopidogrel 75 MG tablet Commonly known as: PLAVIX Take 1 tablet (75 mg total) by mouth daily.   EpiPen 2-Pak 0.3 mg/0.3 mL Soaj injection Generic drug: EPINEPHrine Inject 0.3 mg into the skin daily as needed (allergic reaction).   FLUoxetine 10 MG tablet Commonly known as: PROZAC Take 10 mg by mouth daily.   fluticasone 50 MCG/ACT nasal spray Commonly known as: FLONASE Place 2 sprays into the nose daily.   gabapentin 100 MG capsule Commonly known as: NEURONTIN Take 100 mg by mouth at bedtime.   LIQUID TEARS OP Place 1 drop into both eyes daily as needed (dry eyes).   metoprolol tartrate 25 MG tablet Commonly known as: LOPRESSOR Take 0.5 tablets (12.5 mg total) by mouth 2 (two) times daily.   montelukast 10 MG tablet Commonly known as: SINGULAIR Take 10 mg by mouth at bedtime.   multivitamin with minerals Tabs tablet Take 1 tablet by mouth daily.   nitroGLYCERIN 0.4 MG SL tablet Commonly known as: NITROSTAT Place 1 tablet (0.4 mg total) under the tongue every 5 (five) minutes as needed for chest pain (up to 3 doses. If taking 3rd dose call 911). What changed: reasons to take this   Olopatadine HCl 0.2 % Soln Place 1 drop into both eyes daily as needed (allergies).   pantoprazole 40 MG tablet Commonly known as: PROTONIX Take 2 tablets (80 mg total) by mouth daily. Start taking on: February 19, 2021 Replaces: omeprazole 40 MG capsule   ProAir HFA 108 (90 Base) MCG/ACT inhaler Generic drug: albuterol Inhale 1-2 puffs into the lungs every 4 (four) hours as needed for wheezing.   albuterol (2.5 MG/3ML) 0.083% nebulizer solution Commonly known as: PROVENTIL Take 3 mLs (2.5 mg total) by nebulization every 2 (two) hours as needed for wheezing or shortness of breath.    rosuvastatin 20 MG tablet Commonly known as: CRESTOR Take 1 tablet (20 mg total) by mouth daily at 6 PM. What changed: when to take this   Symbicort 160-4.5 MCG/ACT inhaler Generic drug: budesonide-formoterol Inhale 2 puffs into the lungs 2 (two) times daily.   vitamin B-12 500 MCG tablet Commonly known as: CYANOCOBALAMIN Take 500 mcg by mouth daily.   Vitamin D3 50 MCG (2000 UT) Tabs Take 2,000 Units by mouth daily.            Discharge Care Instructions  (From admission, onward)         Start     Ordered   02/18/21 0000  Discharge wound care:       Comments: Apply antifungal powder (green and white bottle in clean utility) to gluteal cleft AFTER cleansing with soap and water, patting dry.   02/18/21 1412             Outstanding Labs/Studies   N/A  Duration of Discharge Encounter   Greater than 30 minutes including physician time.  Signed,  Charlie Pitter, PA-C 02/18/2021, 2:55 PM

## 2021-02-18 NOTE — Progress Notes (Signed)
From: Charlie Pitter, PA-C  Sent: 02/18/2021  1:55 PM EDT  To: [facility] Triage  Subject: Sleep study please                Hi triage, I was going to send this to Lenice Llamas but she is out. We are discharging this patient from the hospital. She is a patient of [facility]. She needs a sleep study for nocturnal desaturations seen in the hospital as well as HTN. Can you help arrange? I was not sure what order we are doing these days. Thanks!  Dayna    Placed order for sleep study, message forwarded to sleep pool.

## 2021-02-18 NOTE — Progress Notes (Signed)
CARDIAC REHAB PHASE I   PRE:  Rate/Rhythm: 74 SR with PVCs    BP: sitting 167/61    SaO2: 95 RA  MODE:  Ambulation: 340 ft   POST:  Rate/Rhythm: 100 ST after walk, 112 ST to BR    BP: sitting 173/79     SaO2: 91-93 RA  Tolerated well. No c/o CP or right arm pain. Her knee felt well too. Does have bouts of increased PVCs, trigeminy. BP elevated. Discussed stent, restrictions, Plavix, diet, exercise (with consideration of knee), NTG, and CRPII. Will refer to [place]. She is eager to do program again. [place], ACSM 02/18/2021 8:45 AM

## 2021-02-18 NOTE — Progress Notes (Signed)
Patients blood pressure soft 107/49 at med pass and stating that she felt dizzy. Paged [facility] and got orders to hold po metoprolol for tonight and watch blood pressure. Patient also on iv nitro, decreased dosage to 89mcg/min.   Update: patients blood pressure better 136/55.

## 2021-02-18 NOTE — Consult Note (Signed)
[facility] Nurse Consult Note: Patient receiving care in [place]. 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 Pressure Injury POA: Yes/No/NA THIS IS NOT A PRESSURE INJURY--IT IS MOISTURE RELATED, and the patient informs me sits in a recliner when she is at home many hours every day. Measurement: Wound bed: Drainage (amount, consistency, odor)  Periwound: Dressing procedure/placement/frequency:  Apply antifungal powder (green and white bottle in clean utility) AFTER cleansing with soap and water, patting dry. Thank you for the consult.  Discussed plan of care with the patient and bedside nurse.  [facility] nurse will not follow at this time.  Please re-consult the [facility] team if needed.  Val Riles, RN, MSN, CWOCN, CNS-BC, pager [phone]

## 2021-02-19 ENCOUNTER — Telehealth: Payer: Self-pay | Admitting: *Deleted

## 2021-02-19 ENCOUNTER — Telehealth: Payer: Self-pay | Admitting: Cardiovascular Disease

## 2021-02-19 NOTE — Telephone Encounter (Signed)
Spoke to patient about spot where IV was during hospital stay, no redness or heat present.  Patient advised to place a warm compress on the area.  Patient concerned about the hospital stopping her lasix and she was advised to call the office if she has any swelling or shortness of breath.

## 2021-02-19 NOTE — Telephone Encounter (Signed)
PA request for split night sleep study submitted to [facility] via portal. Clinicals faxed to [phone]. Tracking # [id number].

## 2021-02-19 NOTE — Telephone Encounter (Deleted)
Spoke to patient about spot where IV was during hospital stay, no redness or heat present.  Patient advised to place a warm compress on the area.  Patient concerned about the hospital stopping her lasix and she was advised to call the office if she has any swelling or shortness of breath.

## 2021-02-19 NOTE — Telephone Encounter (Addendum)
Debbie from dr Rosana Hoes office with [facility] called to say her sleep study will be approved today and fax to follow in 24 hrs.

## 2021-02-19 NOTE — Telephone Encounter (Signed)
Patient had a cath done on 04/20 and after one of the nurses took one of her IV's out she states about a couple hours later she got a knot in that spot. She states that it is red and about 2inches in size. It is not warm or hot to the touch. She wants to know if this is normal.    She also wanted me to let Dr. Radford Pax know how thankful she is that she sent her to the hospital - she was very grateful over the phone.

## 2021-02-22 ENCOUNTER — Telehealth: Payer: Self-pay | Admitting: Cardiology

## 2021-02-22 ENCOUNTER — Telehealth: Payer: Self-pay | Admitting: *Deleted

## 2021-02-22 ENCOUNTER — Telehealth: Payer: Self-pay | Admitting: Cardiovascular Disease

## 2021-02-22 ENCOUNTER — Encounter (HOSPITAL_COMMUNITY): Admission: RE | Admit: 2021-02-22 | Payer: Medicare PPO | Source: Ambulatory Visit

## 2021-02-22 NOTE — Telephone Encounter (Signed)
PT is calling in with questions about the instructions on her discharge papers.PT states she has never had a BP that is in the range on the papers.She wants to speak with someone to explain instructions on discharge papers.Please advise

## 2021-02-22 NOTE — Telephone Encounter (Signed)
Staff message sent to Donna Williamson ok to schedule sleep study. [employer] auth received. Auth # [id number]. Valid dates 04/12/21 to 05/12/21.

## 2021-02-22 NOTE — Telephone Encounter (Signed)
Returned call to Pt.  Per Pt she was instructed upon discharge from hospital to monitor blood pressure and notify us if blood pressure was consistently greater than 130/80.  Per Pt her blood pressure has never gotten below 381 systolic  She states her systolic blood pressure is running 017-510'C and diastolic 58-52.  Advised would send message to Dr. Burt Knack for advisement.  She was discharged on amlodipine 5 mg daily, and metoprolol tartrate 12.5 mg BID.  Pt states she is feeling better each day since discharge.

## 2021-02-23 MED ORDER — METOPROLOL TARTRATE 25 MG PO TABS: 25.0000 mg | ORAL_TABLET | Freq: Two times a day (BID) | ORAL | 3 refills | Status: DC

## 2021-02-23 NOTE — Telephone Encounter (Signed)
Recommend increase metoprolol to 25 mg BID. Reviewed hospital notes and HR was in the 70's, so I think she will tolerate this increase.

## 2021-02-23 NOTE — Telephone Encounter (Signed)
    Dani Gobble RN - case manager with [employer] calling to follow up regarding pt's call about her BP. She said pt is getting anxious and if someone can give her a call back today. She said to call pt on her cell # or call her on her home #

## 2021-02-23 NOTE — Telephone Encounter (Signed)
Instructed the patient to INCREASE METOPROLOL to 25 mg BID.  She will check BP once daily 2 hours after her morning medications and call if readings do not improve.   In the hospital, her Lasix was stopped due to soft BP. She now complains of minimal swelling in her ankles and feet. Instructed her to elevate legs when sitting and to limit salt in her diet.  She inquires about restarting Lasix.   She understands she will be called back with Dr. Antionette Char recommendations.

## 2021-02-23 NOTE — Progress Notes (Signed)
Spoke with the patient for another issue (see 4/25 phone note). Reiterated to her that the CPAP assistant will call to arrange PSG once precert process is complete.

## 2021-02-24 MED ORDER — FUROSEMIDE 40 MG PO TABS: 40.0000 mg | ORAL_TABLET | Freq: Every day | ORAL | 3 refills | Status: DC | PRN

## 2021-02-24 NOTE — Telephone Encounter (Signed)
Informed the patient it is OK for her to restart Lasix as long as she keeps an eye on her BP. She states her swelling is hardly noticeable today. Instead of taking daily, she will take Lasix 40 mg once daily PRN for swelling. She will continue to monitor BP and bring readings to her visit with Sharee Pimple in May. She was grateful for call and agrees with plan.  The patient has plenty of Lasix at this time. Will add Lasix 40 mg qd PRN to medication list and refills will be addressed at visit with Sharee Pimple.

## 2021-02-24 NOTE — Telephone Encounter (Signed)
Yes - ok to restart lasix as long as she's keeping an eye on her BP. thx

## 2021-02-25 ENCOUNTER — Telehealth (HOSPITAL_COMMUNITY): Payer: Self-pay

## 2021-02-25 NOTE — Progress Notes (Signed)
PSG has been scheduled 04/28/21.

## 2021-02-25 NOTE — Telephone Encounter (Signed)
Pt insurance is active and benefits verified through [facility]. Co-pay $20.00, DED $0.00/$0.00 met, out of pocket $3,300.00/$140.00 met, co-insurance 0%. No pre-authorization required. Passport, 02/25/21 @ 11:05AM, UYQ#[id number]  Will contact patient to see if she is interested in the Cardiac Rehab Program. If interested, patient will need to complete follow up appt. Once completed, patient will be contacted for scheduling upon review by the RN Navigator.

## 2021-02-25 NOTE — Telephone Encounter (Signed)
Called patient to see if she is interested in the Cardiac Rehab Program. Patient expressed interest. Explained scheduling process and went over insurance, patient verbalized understanding. Will contact patient for scheduling once f/u has been completed. 

## 2021-02-26 DIAGNOSIS — I129 Hypertensive chronic kidney disease with stage 1 through stage 4 chronic kidney disease, or unspecified chronic kidney disease: Secondary | ICD-10-CM | POA: Diagnosis not present

## 2021-02-26 DIAGNOSIS — N1832 Chronic kidney disease, stage 3b: Secondary | ICD-10-CM | POA: Diagnosis not present

## 2021-02-26 DIAGNOSIS — I200 Unstable angina: Secondary | ICD-10-CM | POA: Diagnosis not present

## 2021-02-26 DIAGNOSIS — I240 Acute coronary thrombosis not resulting in myocardial infarction: Secondary | ICD-10-CM | POA: Diagnosis not present

## 2021-03-01 ENCOUNTER — Ambulatory Visit: Admit: 2021-03-01 | Payer: Medicare PPO | Admitting: Orthopedic Surgery

## 2021-03-01 ENCOUNTER — Telehealth: Payer: Self-pay

## 2021-03-01 SURGERY — ARTHROPLASTY, KNEE, TOTAL
Anesthesia: Spinal | Site: Knee | Laterality: Left

## 2021-03-01 NOTE — Telephone Encounter (Addendum)
Anderson Malta with [facility]/ Reginold Agent NP transferred to me today... she is calling to report the pt was seen this past Friday and she reports that her chest pain and Sob have improved but she is still c/o increased fatigue with minimal exertion. Her BP at the office visit was 168/78. She increased her Amlodipine to 10 mg daily.   The pt has an appt 03/30/21 with Kathyrn Drown NP but she is asking to have the appt moved up. I advised her that Dr. Antionette Char nurse has been in communication with the pt recently re: some recent med changes.   I will forward her message to Dr. Nance Pew for review.

## 2021-03-02 ENCOUNTER — Telehealth: Payer: Self-pay | Admitting: *Deleted

## 2021-03-02 NOTE — Telephone Encounter (Signed)
Patient is scheduled for lab study on 04-28-21. Patient understands her sleep study will be done at [facility] sleep lab. Patient understands she will receive a sleep packet in a week or so. Patient understands to call if she does not receive the sleep packet in a timely manner. Patient agrees with treatment and thanked me for call.

## 2021-03-02 NOTE — Telephone Encounter (Signed)
-----   Message from Lauralee Evener, [place] sent at 02/22/2021  9:38 AM EDT ----- Regarding: RE: Sleep study please Ok to schedule sleep study. [facility] Auth # [id number]. Valid dates 04/12/21 to 05/12/21. ----- Message ----- From: Willeen Cass, RN Sent: 02/18/2021   2:10 PM EDT To: [facility] Studies Subject: FW: Sleep study please                          ----- Message ----- From: Willeen Cass, RN Sent: 02/18/2021   1:59 PM EDT To: Freada Bergeron, CMA, Cv Div [facility] Triage Subject: FW: Sleep study please                          ----- Message ----- From: Felipa Evener Sent: 02/18/2021   1:55 PM EDT To: [facility] Triage Subject: Sleep study please                             Hi triage, I was going to send this to Lenice Llamas but she is out. We are discharging this patient from the hospital. She is a patient of [facility]. She needs a sleep study for nocturnal desaturations seen in the hospital as well as HTN. Can you help arrange? I was not sure what order we are doing these days. Thanks! Dayna

## 2021-03-04 NOTE — Telephone Encounter (Signed)
Patient is returning call.  °

## 2021-03-04 NOTE — Telephone Encounter (Signed)
Left message to call back  

## 2021-03-04 NOTE — Telephone Encounter (Signed)
Scheduled the patient for visit with Richardson Dopp 03/08/21.  While on the phone, she reported swelling in her feet has been worse since she increased her amlodipine to 10 mg daily (her PCP increased at last visit due to systolic BP in 827M). This AM, her BP was 130s/80s when she took her medication. Instructed her to decrease amlodipine back to 5 mg daily (this was better tolerated) and to check BP 2 hours after she takes her meds and to bring readings to visit with Scott on Monday. She will also take her PRN Lasix.  She was grateful for call and agrees with plan.

## 2021-03-07 NOTE — Progress Notes (Addendum)
Cardiology Office Note:    Date:  03/08/2021   ID:  Cori, Alexie [birth year], MRN [id number]  PCP:  Crist Infante, MD   [facility] Providers Cardiologist:  Sherren Mocha, MD     Referring MD: Crist Infante, MD   Chief Complaint:  Hospitalization Follow-up (S/p PCI)    Patient Profile:    MELLANIE ALTSCHULER is a 75 y.o. female with:   Coronary artery disease   NSTEMI 04/2016 s/p DES to [place] 3/21: low risk  S/p DES to LAD and POBA of D2 in 01/2021  (HFpEF) heart failure with preserved ejection fraction   Hypertension   Hyperlipidemia   Chronic kidney disease   COPD  GERD  Sjogren's syndrome   Prior CV studies: LEFT HEART CATH 02/17/2021 Narrative  Ost 2nd Diag to 2nd Diag lesion is 70% stenosed.  Previously placed 1st Mrg stent (unknown type) is widely patent.  Prox Cx to Mid Cx lesion is 40% stenosed.  Prox LAD to Mid LAD lesion is 40% stenosed.  2nd Diag lesion is 80% stenosed.  Mid LAD lesion is 80% stenosed.  Post intervention, there is a 80% residual stenosis.  A drug-eluting stent was successfully placed using a SYNERGY XD [id number].  Post intervention, there is a 0% residual stenosis.  1. Moderate, calcified proximal LAD stenosis. 2. Severe mid LAD stenosis at the takeoff of the moderate caliber second Diagonal branch. RFR of the mid LAD lesion was 0.84 suggesting the lesion was flow limiting. The ostium of the moderate caliber second Diagonal branch had a severe stenosis. 3. Patent obtuse marginal stent 4. Non-dominant RCA with mild plaque 5. Successful balloon angioplasty of the ostium of the Diagonal branch. This was done prior to stenting of the LAD. TIMI-3 flow down the Diagonal branch post LAD stenting. 6. Successful PTCA/DES x 1 mid LAD. 7. Preserved LV systolic function.  Recommendations: Will continue DAPT with ASA and Plavix for at least six months. Continue statin. I would expect that she will be watched overnight  and discharged home tomorrow.   Echocardiogram 02/18/21 GLS -17.4; EF 60-65, no RWMA, normal RVSF, mild LAE, trivial MR, severe nodular calcification of AV L coronary cusp, no AS  GATED SPECT MYO PERF W/LEXISCAN STRESS 2D 01/20/2020 Narrative  Nuclear stress EF: 54%.  The left ventricular ejection fraction is mildly decreased (45-54%).  There was no ST segment deviation noted during stress.  Diaphragmatic attenuation,  No ischemia noted, Low risk study.   LONG TERM MONITOR (3-7 DAYS) INTERPRETATION 06/13/2019 Narrative 1) The basic rhythm is normal sinus with an average HR of 67 bpm 2) Occasional short runs of SVT 8-10 beats 3) Occasional PVC's with burden < 1% 4) No sustained arrhythmia, bradycardic events, or pathologic pauses   Carotid US 3/15 Normal bilateral carotid arteries   History of Present Illness: Ms. Chihuahua was seen by Dr. Radford Pax 02/16/21 with symptoms of unstable angina.  She was admitted from the office (4/19-4/21).  Cardiac catheterization demonstrated severe mLAD and severe oD2 stenoses.  The mLAD was tx with a DES and the oD2 was tx with POBA. Echocardiogram demonstrated normal EF.  she was noted to have nocturnal desaturations during her stay and it was felt she would need OP sleep study.  She had a slight bump in her creatinine and her BP was running low.  Her furosemide and isosorbide were held at [facility].  She returns for f/u.  She is here alone.  She is doing well without chest  pain, shortness of breath, syncope, orthopnea.  Her BP was high when she got home from the hospital.  Amlodipine was new for her and this was increased to 10 mg. She has had a lot of leg edema since then.  She has started back on Lasix 40 mg once daily.  She would like to go back to taking Diltiazem.  Her BP was well controlled then and she did not have significant edema.         Past Medical History:  Diagnosis Date  . Anemia   . Anginal pain ([place])   . Anxiety   . Arthritis    "feet, knees,  back" (05/02/2016)  . Asthma   . CAD (coronary artery disease)    a. 04/2016: NSTEMI 95% stenosis 1st Mrg (s/p DES)  . Chest pain    a. 10/2013 Exercise Myoview: Ef 65%, no ischemia.  . CHF (congestive heart failure) ([place])   . Chronic bronchitis ([place])   . Chronic lower back pain   . COPD (chronic obstructive pulmonary disease) ([place])   . GERD (gastroesophageal reflux disease)   . History of blood transfusion 1960s   "related to my hysterectomy"  . Hypercholesterolemia   . Hypertension   . Lower extremity edema   . Mitral valve prolapse   . NSTEMI (non-ST elevated myocardial infarction) ([place]) 05/01/2016  . Pneumonia 05/01/2016   "saw trace of slight pneumonia/CT scan" (05/02/2016)  . Seasonal allergies   . Sjogren's syndrome ([place])   . Wheezing     Current Medications: Current Meds  Medication Sig  . albuterol (PROVENTIL) (2.5 MG/3ML) 0.083% nebulizer solution Take 3 mLs (2.5 mg total) by nebulization every 2 (two) hours as needed for wheezing or shortness of breath.  . ALPRAZolam (XANAX) 0.5 MG tablet Take 0.5 mg by mouth daily as needed for anxiety.   Marland Kitchen aspirin EC 81 MG tablet Take 81 mg by mouth daily.  . Cholecalciferol (VITAMIN D3) 2000 UNITS TABS Take 2,000 Units by mouth daily.  . clopidogrel (PLAVIX) 75 MG tablet Take 1 tablet (75 mg total) by mouth daily.  . cyanocobalamin 500 MCG tablet Take 500 mcg by mouth daily.  Marland Kitchen diltiazem (CARDIZEM CD) 240 MG 24 hr capsule Take 1 capsule (240 mg total) by mouth daily.  Marland Kitchen EPIPEN 2-PAK 0.3 MG/0.3ML SOAJ injection Inject 0.3 mg into the skin daily as needed (allergic reaction).   Marland Kitchen FLUoxetine (PROZAC) 10 MG tablet Take 10 mg by mouth daily.  . fluticasone (FLONASE) 50 MCG/ACT nasal spray Place 2 sprays into the nose daily.  . furosemide (LASIX) 40 MG tablet Take 1 tablet (40 mg total) by mouth daily as needed for fluid or edema.  . gabapentin (NEURONTIN) 100 MG capsule Take 100 mg by mouth at bedtime.   . metoprolol tartrate (LOPRESSOR) 25 MG  tablet Take 0.5 tablets (12.5 mg total) by mouth 2 (two) times daily.  . montelukast (SINGULAIR) 10 MG tablet Take 10 mg by mouth at bedtime.  . Multiple Vitamin (MULTIVITAMIN WITH MINERALS) TABS tablet Take 1 tablet by mouth daily.  . nitroGLYCERIN (NITROSTAT) 0.4 MG SL tablet Place 1 tablet (0.4 mg total) under the tongue every 5 (five) minutes as needed for chest pain (up to 3 doses. If taking 3rd dose call 911).  . Olopatadine HCl 0.2 % SOLN Place 1 drop into both eyes daily as needed (allergies).  . pantoprazole (PROTONIX) 40 MG tablet Take 40 mg by mouth daily.  . Polyvinyl Alcohol (LIQUID TEARS OP) Place 1  drop into both eyes daily as needed (dry eyes).  Marland Kitchen PROAIR HFA 108 (90 BASE) MCG/ACT inhaler Inhale 1-2 puffs into the lungs every 4 (four) hours as needed for wheezing.   . rosuvastatin (CRESTOR) 20 MG tablet Take 1 tablet (20 mg total) by mouth daily at 6 PM. (Patient taking differently: Take 20 mg by mouth every evening.)  . SYMBICORT 160-4.5 MCG/ACT inhaler Inhale 2 puffs into the lungs 2 (two) times daily.  . [DISCONTINUED] amLODipine (NORVASC) 5 MG tablet Take 5 mg by mouth daily.  . [DISCONTINUED] metoprolol tartrate (LOPRESSOR) 25 MG tablet Take 1 tablet (25 mg total) by mouth 2 (two) times daily.     Allergies:   Levaquin [levofloxacin], Codeine, Drug ingredient [ticagrelor], Nitrofurantoin, Ciprofloxacin, Penicillins, and Sulfa antibiotics   Social History   Tobacco Use  . Smoking status: Never Smoker  . Smokeless tobacco: Never Used  Vaping Use  . Vaping Use: Never used  Substance Use Topics  . Alcohol use: No    Alcohol/week: 0.0 standard drinks  . Drug use: No     Family Hx: The patient's family history includes Heart disease in her father; Hypertension in her mother; Ovarian cancer in her sister; Stroke in her mother.  ROS - See HPI  EKGs/Labs/Other Test Reviewed:    EKG:  EKG is  ordered today.  The ekg ordered today demonstrates sinus brady, HR 59, normal  axis, inf Qs, cannot r/o ant Qs, QTc 437 ms, no ST-TW changes.  Recent Labs: 02/16/2021: TSH 2.980 02/17/2021: ALT 16 02/18/2021: BUN 12; Creatinine, Ser 1.17; Hemoglobin 11.1; Magnesium 2.2; Platelets 258; Potassium 3.8; Sodium 139   Recent Lipid Panel Lab Results  Component Value Date/Time   CHOL 131 02/17/2021 01:54 AM   TRIG 132 02/17/2021 01:54 AM   HDL 46 02/17/2021 01:54 AM   CHOLHDL 2.8 02/17/2021 01:54 AM   LDLCALC 59 02/17/2021 01:54 AM    Risk Assessment/Calculations:      Physical Exam:    VS:  BP 110/60 (BP Location: Right Arm, Patient Position: Sitting, Cuff Size: Normal)   Pulse (!) 59   Ht 5\' 5"  (1.651 m)   Wt 223 lb (101.2 kg)   SpO2 96%   BMI 37.11 kg/m     Wt Readings from Last 3 Encounters:  03/08/21 223 lb (101.2 kg)  02/18/21 221 lb 9.6 oz (100.5 kg)  02/16/21 226 lb (102.5 kg)     Constitutional:      Appearance: Healthy appearance. Not in distress.  Neck:     Vascular: JVD normal.  Pulmonary:     Effort: Pulmonary effort is normal.     Breath sounds: No wheezing. No rales.  Cardiovascular:     Normal rate. Regular rhythm. Normal S1. Normal S2.     Murmurs: There is no murmur.     Comments: R wrist without hematoma Edema:    Ankle: bilateral trace edema of the ankle. Abdominal:     Palpations: Abdomen is soft. There is no hepatomegaly.  Skin:    General: Skin is warm and dry.  Neurological:     Mental Status: Alert and oriented to person, place and time.     Cranial Nerves: Cranial nerves are intact.          ASSESSMENT & PLAN:    1. Coronary artery disease involving native coronary artery of native heart without angina pectoris Status post non-STEMI in 7/17 treated with a DES to the OM1.  She recently was seen for unstable  angina pectoris and underwent DES to the LAD and balloon angioplasty to the D2.  OM1 stent was patent.  She is doing well without anginal symptoms.  We discussed the need to remain on dual antiplatelet therapy for  at least the next 6 months.  She plans to pursue cardiac rehabilitation.  I have encouraged her to do this.  Continue aspirin, clopidogrel, metoprolol, rosuvastatin.  Follow-up in 3-4 months with Dr. Burt Knack or me.  2. Chronic heart failure with preserved ejection fraction (HCC) Overall, volume status stable.  She is back on furosemide daily.  Obtain follow-up BMET today.  3. Nocturnal oxygen desaturation 4. Snoring She was noted to have desaturations in the hospital.  She has a mild history of snoring but nothing significant.  She was set up for an in lab sleep study at [facility].  However, she questions if her nocturnal desaturations were due to not getting her inhalers while in the hospital.  I will see if we can get her sleep study switched over to a home study ([facility]).  She would prefer this.    5. Essential hypertension Blood pressure is well controlled.  She is having difficulty tolerating amlodipine.  She was able to tolerate diltiazem without any lower extremity swelling.  She could not tolerate ARB.  I will stop her amlodipine and place her back on diltiazem to 40 mg daily.  Decrease metoprolol back to 12.5 mg twice daily.  I have asked her to notify us if her blood pressure should start to increase.  We can always get her back on isosorbide if needed.  6. Pure hypercholesterolemia LDL optimal on most recent lab work.  Continue current Rx.    7. Stage 3a chronic kidney disease ([place]) Obtain follow-up BMET today.         Dispo:  Return in about 4 months (around 07/09/2021) for Routine Follow Up, w/ Dr. Burt Knack, or Richardson Dopp, PA-C.   Medication Adjustments/Labs and Tests Ordered: Current medicines are reviewed at length with the patient today.  Concerns regarding medicines are outlined above.  Tests Ordered: Orders Placed This Encounter  Procedures  . Basic metabolic panel  . EKG 12-Lead  . [facility] Sleep Study   Medication Changes: Meds ordered this encounter  Medications   . diltiazem (CARDIZEM CD) 240 MG 24 hr capsule    Sig: Take 1 capsule (240 mg total) by mouth daily.    Dispense:  90 capsule    Refill:  3  . metoprolol tartrate (LOPRESSOR) 25 MG tablet    Sig: Take 0.5 tablets (12.5 mg total) by mouth 2 (two) times daily.    Dispense:  90 tablet    Refill:  3    Signed, Richardson Dopp, PA-C  03/08/2021 4:45 PM    [facility] [address], [place], [place]  [address] Phone: [phone]; Fax: [phone]

## 2021-03-08 ENCOUNTER — Ambulatory Visit: Payer: Medicare PPO | Admitting: Physician Assistant

## 2021-03-08 ENCOUNTER — Other Ambulatory Visit: Payer: Self-pay

## 2021-03-08 ENCOUNTER — Encounter: Payer: Self-pay | Admitting: Physician Assistant

## 2021-03-08 VITALS — BP 110/60 | HR 59 | Ht 65.0 in | Wt 223.0 lb

## 2021-03-08 DIAGNOSIS — E7800 Pure hypercholesterolemia, unspecified: Secondary | ICD-10-CM

## 2021-03-08 DIAGNOSIS — I5032 Chronic diastolic (congestive) heart failure: Secondary | ICD-10-CM | POA: Diagnosis not present

## 2021-03-08 DIAGNOSIS — R0683 Snoring: Secondary | ICD-10-CM | POA: Diagnosis not present

## 2021-03-08 DIAGNOSIS — N1831 Chronic kidney disease, stage 3a: Secondary | ICD-10-CM | POA: Diagnosis not present

## 2021-03-08 DIAGNOSIS — I2510 Atherosclerotic heart disease of native coronary artery without angina pectoris: Secondary | ICD-10-CM | POA: Diagnosis not present

## 2021-03-08 DIAGNOSIS — G4734 Idiopathic sleep related nonobstructive alveolar hypoventilation: Secondary | ICD-10-CM

## 2021-03-08 DIAGNOSIS — I10 Essential (primary) hypertension: Secondary | ICD-10-CM

## 2021-03-08 MED ORDER — METOPROLOL TARTRATE 25 MG PO TABS: 12.5000 mg | ORAL_TABLET | Freq: Two times a day (BID) | ORAL | 3 refills | Status: DC

## 2021-03-08 MED ORDER — DILTIAZEM HCL ER COATED BEADS 240 MG PO CP24: 240.0000 mg | ORAL_CAPSULE | Freq: Every day | ORAL | 3 refills | Status: DC

## 2021-03-08 NOTE — Progress Notes (Signed)
Patient Name:         DOB:       Height:     Weight:  Office Name:         Referring Provider:  Today's Date:  Date:   STOP BANG RISK ASSESSMENT S (snore) Have you been told that you snore?      NO   T (tired) Are you often tired, fatigued, or sleepy during the day?  no NO  O (obstruction) Do you stop breathing, choke, or gasp during sleep?  NO   P (pressure) Do you have or are you being treated for high blood pressure? YES   B (BMI) Is your body index greater than 35 kg/m? YES   A (age) Are you 77 years old or older? YES   N (neck) Do you have a neck circumference greater than 16 inches?   NO   G (gender) Are you a female? NO   TOTAL STOP/BANG "YES" ANSWERS 3                                                                       For Office Use Only              Procedure Order Form    YES to 3+ Stop Bang questions OR two clinical symptoms - patient qualifies for WatchPAT (CPT 95800)             Clinical Notes: Will consult Sleep Specialist and refer for management of therapy due to patient increased risk of Sleep Apnea. Ordering a sleep study due to the following two clinical symptoms: Excessive daytime sleepiness G47.10 / Gastroesophageal reflux K21.9 / Nocturia R35.1 / Morning Headaches G44.221 / Difficulty concentrating R41.840 / Memory problems or poor judgment G31.84 / Personality changes or irritability R45.4 / Loud snoring R06.83 / Depression F32.9 / Unrefreshed by sleep G47.8 / Impotence N52.9 / History of high blood pressure R03.0 / Insomnia G47.00    I understand that I am proceeding with a home sleep apnea test as ordered by my treating physician. I understand that untreated sleep apnea is a serious cardiovascular risk factor and it is my responsibility to perform the test and seek management for sleep apnea. I will be contacted with the results and be managed for sleep apnea by a local sleep physician. I will be receiving equipment and further instructions from  [facility]. I shall promptly ship back the equipment via the included mailing label. I understand my insurance will be billed for the test and as the patient I am responsible for any insurance related out-of-pocket costs incurred. I have been provided with written instructions and can call for additional video or telephonic instruction, with 24-hour availability of qualified personnel to answer any questions: Patient Help Desk [phone].

## 2021-03-08 NOTE — Patient Instructions (Signed)
Medication Instructions:  Your physician has recommended you make the following change in your medication:  STOP AMLODIPINE  START DILTIAZEM 240 MG EVERYDAY  DECREASE METOPROLOL TO 12.5 MG TWICE  A DAY   *If you need a refill on your cardiac medications before your next appointment, please call your pharmacy*   Lab Work: BMET TODAY If you have labs (blood work) drawn today and your tests are completely normal, you will receive your results only by: Marland Kitchen MyChart Message (if you have MyChart) OR . A paper copy in the mail If you have any lab test that is abnormal or we need to change your treatment, we will call you to review the results.   Testing/Procedures: Home sleep study [place]   Follow-Up: At [facility], you and your health needs are our priority.  As part of our continuing mission to provide you with exceptional heart care, we have created designated Provider Care Teams.  These Care Teams include your primary Cardiologist (physician) and Advanced Practice Providers (APPs -  Physician Assistants and Nurse Practitioners) who all work together to provide you with the care you need, when you need it.  We recommend signing up for the patient portal called "MyChart".  Sign up information is provided on this After Visit Summary.  MyChart is used to connect with patients for Virtual Visits (Telemedicine).  Patients are able to view lab/test results, encounter notes, upcoming appointments, etc.  Non-urgent messages can be sent to your provider as well.   To learn more about what you can do with MyChart, go to [email or web address].    Your next appointment:   4 month(s)  The format for your next appointment:   In Person  Provider:   You may see Sherren Mocha, MD  or one of the following Advanced Practice Providers on your designated Care Team:    Richardson Dopp, PA-C  Robbie Lis, [place]    Other Instructions

## 2021-03-09 LAB — BASIC METABOLIC PANEL
BUN/Creatinine Ratio: 12 (ref 12–28)
BUN: 14 mg/dL (ref 8–27)
CO2: 21 mmol/L (ref 20–29)
Calcium: 8.9 mg/dL (ref 8.7–10.3)
Chloride: 104 mmol/L (ref 96–106)
Creatinine, Ser: 1.13 mg/dL — ABNORMAL HIGH (ref 0.57–1.00)
Glucose: 92 mg/dL (ref 65–99)
Potassium: 4.1 mmol/L (ref 3.5–5.2)
Sodium: 143 mmol/L (ref 134–144)
eGFR: 50 mL/min/{1.73_m2} — ABNORMAL LOW (ref >59)

## 2021-03-13 ENCOUNTER — Telehealth: Payer: Self-pay | Admitting: Physician Assistant

## 2021-03-13 NOTE — Telephone Encounter (Signed)
Pt called stating her legs have red splotches on them from knee to feet. She also has extreme swelling in both feet. She took 40 mg lasix yesterday and today. She also elevated her feet with some improvement. She is concerned that her legs are very swollen, splotches developed with the lower extremity edema. She has baseline SOB due to asthma, nothing new or worse. She had some blood in her stool this morning - has had intermittent bright red blood in her stool since starting plavix, sounds consistent with hemorrhoids.   Red splotches are not painful, pruritic, or warm. Some are an 0.5 inch in diameter. Protonix and plavix are the only new medications and she has been taking them for a month, lower suspicion for an allergic reaction. She questioned benadryl and I advised she could try that.   She is not febrile, no CP, dizziness, weakness, or pre-syncope. I have advised her to take 80 mg lasix today and tomorrow to see if that helps the swelling. It is difficult to evaluate the rash over the phone, but I do not think this needs an ER visit. I have asked her to see Dr. Joylene Draft on Monday for this. PCI was radial stick, not femoral. She will call back if worse tomorrow.   I will ask the office to schedule a visit for evaluation of swelling and rash.

## 2021-03-15 ENCOUNTER — Telehealth: Payer: Self-pay | Admitting: Cardiovascular Disease

## 2021-03-15 NOTE — Telephone Encounter (Signed)
  Pt c/o swelling: STAT is pt has developed SOB within 24 hours  1) How much weight have you gained and in what time span? 2 pounds on Saturday  2) If swelling, where is the swelling located? Lower leg and feet  3) Are you currently taking a fluid pill? yes  4) Are you currently SOB? Only if she is up moving around, pt states she also has asthma  5) Do you have a log of your daily weights (if so, list)? Yes   6) Have you gained 3 pounds in a day or 5 pounds in a week? No   7) Have you traveled recently? No   Patient has swelling and a rash on her lower legs and feet. She is drinking more water. She spoke to APP on call over the weekend who told her to take 80 mg of lasix this weekend. Appointment has been made for 04/05/21 with our office. Patient has appt on 03/17/21 with PCP regarding the rash.

## 2021-03-15 NOTE — Telephone Encounter (Signed)
Called the patient for an update. She is concerned about the swelling and rash on her legs. She is scheduled to see Dr. Joylene Draft on Wednesday and Cardiology in a couple weeks. Reiterated to her she may take Benadryl if that helps and to call Dr. Silvestre Mesi office in the morning to see if there are any cancellations. She will call after seeing him for an update. She was grateful for call and agrees with plan.

## 2021-03-17 ENCOUNTER — Other Ambulatory Visit: Payer: Self-pay | Admitting: Physician Assistant

## 2021-03-17 ENCOUNTER — Telehealth: Payer: Self-pay | Admitting: Cardiovascular Disease

## 2021-03-17 DIAGNOSIS — E785 Hyperlipidemia, unspecified: Secondary | ICD-10-CM | POA: Diagnosis not present

## 2021-03-17 DIAGNOSIS — R7301 Impaired fasting glucose: Secondary | ICD-10-CM | POA: Diagnosis not present

## 2021-03-17 DIAGNOSIS — I252 Old myocardial infarction: Secondary | ICD-10-CM | POA: Diagnosis not present

## 2021-03-17 DIAGNOSIS — R2681 Unsteadiness on feet: Secondary | ICD-10-CM | POA: Diagnosis not present

## 2021-03-17 DIAGNOSIS — I200 Unstable angina: Secondary | ICD-10-CM | POA: Diagnosis not present

## 2021-03-17 DIAGNOSIS — I10 Essential (primary) hypertension: Secondary | ICD-10-CM | POA: Diagnosis not present

## 2021-03-17 DIAGNOSIS — N1832 Chronic kidney disease, stage 3b: Secondary | ICD-10-CM | POA: Diagnosis not present

## 2021-03-17 DIAGNOSIS — I2510 Atherosclerotic heart disease of native coronary artery without angina pectoris: Secondary | ICD-10-CM | POA: Diagnosis not present

## 2021-03-17 DIAGNOSIS — I129 Hypertensive chronic kidney disease with stage 1 through stage 4 chronic kidney disease, or unspecified chronic kidney disease: Secondary | ICD-10-CM | POA: Diagnosis not present

## 2021-03-17 NOTE — Telephone Encounter (Signed)
Pt c/o swelling: STAT is pt has developed SOB within 24 hours  1) How much weight have you gained and in what time span? 4 POUNDS  2) If swelling, where is the swelling located? FEET AND ANKLES   3) Are you currently taking a fluid pill? YES   4) Are you currently SOB? YES   5) Do you have a log of your daily weights (if so, list)? 2 pounds yesterday, 4 pounds today   6) Have you gained 3 pounds in a day or 5 pounds in a week? yes  7) Have you traveled recently? No      PT STATES SHE JUST LFT PCP AND NEEDS TO SEE SOMEONE IN DR. York Cerise OFFICE IMMEDIATELY, EXPERIENCING A RASH AND EXCESSIVE SWELLING IN FEET AND ANKLES, PT STATES EXPERIENCING SHORTNESS OF BREATH OXYGEN IS AT 88. NO SOONER APPT W/ DR COOPER OR APP

## 2021-03-17 NOTE — Telephone Encounter (Signed)
Pt seen by PCP and he recommended she contact our office for a sooner appt to address rash and swelling.  States Dr. Joylene Draft felt it may be coming from the Plavix that pt started on 4/20.  O2 at PCP office was 88% today.  This was checked as soon as she say down after ambulating to the room.  Recheck at their office after resting was 94%.  Advised pt that we will monitor for any cancellations and see if we can get her in sooner.  Pt would like for Dr. Burt Knack to review and see if anything can be done in the meantime while she awaits her appt on June 6th.

## 2021-03-18 NOTE — Telephone Encounter (Signed)
3 most likely meds of hers that could contribute to rash are: -Lasix (pt has sulfa allergy with rash and Lasix contains sulfa moiety), however she has been taking this since 2017 -Clopidogrel (recently resumed 02/18/21, she also took this 07/2016 - 8/201, not sure if she recalls experiencing a rash at that time) -Diltiazem (recently resumed 03/08/21, she also took this from 2015 - March 2022) -Singulair, metoprolol, and pantoprazole can all cause rash as well, but none of these meds are new for pt either. Unclear what the culprit is if it's medication-induced.  -Would be easier to stop diltiazem first and see if sx improve.  -Prasugrel could be used but agree with general recommendation to avoid in patients > 75 if possible. If decision is made to change pt to prasugrel, she would qualify for 5mg  daily maintenance dosing based on her age. She 29 days s/p stenting on 02/17/21 (typically load with prasugrel 60mg  if < 30 days from PCI vs lower load of 10mg  if > 30 days from PCI - would have Dr Burt Knack weigh in here). -If pt has not reported prior rash with her Lasix, would advise her to double her dose today and take 80mg  to help with her swelling before her appt tomorrow

## 2021-03-18 NOTE — Telephone Encounter (Signed)
Scheduled the patient for evaluation with Donna Williamson tomorrow.  Appointment 6/6 was not cancelled in case close follow-up is recommended (the patient knows this might be cancelled tomorrow).  Will await Dr. Antionette Char recommendations on medication therapy and will send to PharmD for input as well.

## 2021-03-18 NOTE — Telephone Encounter (Signed)
She has a listed allergy/intolerance to Brilinta. I'm not sure we can use Effient for her.  She is > 77 yo. Plus if she truly has a hypersensitivity to Plavix, she could in theory have a reaction to Effient.  Dr. Burt Knack, what do you think?  At her last visit with me, I did switch her back to Diltiazem.  Has she ever had symptoms like this with Diltiazem?  She had difficulty tolerating Olmesartan before.  But we could try other antihypertensives (agents with newer indications for HFpEF).  That may be worth trying before we start changing her antiplatelet Rx after a recent PCI.  If she has video capability and can check her BP at home, we could see if someone has a virtual visit opening as an option.  I'm in preop this week and next, so I have no clinic schedule.   Richardson Dopp, PA-C    03/18/2021 7:54 AM

## 2021-03-18 NOTE — Telephone Encounter (Signed)
The patient is already taking Lasix 80 mg daily (under Dr. Silvestre Mesi instruction 5/18). He also started her on Kdur 10 meq.  Med list updated.  She will continue on increased dose until appointment tomorrow. Dr. Joylene Draft seems to think the issue is the Plavix.   She also requests a follow-up to discuss home sleep test. Will route to CPAP assistant to call patient for update.

## 2021-03-19 ENCOUNTER — Ambulatory Visit: Payer: Medicare PPO | Admitting: Adult Health

## 2021-03-19 ENCOUNTER — Other Ambulatory Visit: Payer: Self-pay

## 2021-03-19 ENCOUNTER — Telehealth: Payer: Self-pay | Admitting: Adult Health

## 2021-03-19 ENCOUNTER — Encounter: Payer: Self-pay | Admitting: Adult Health

## 2021-03-19 VITALS — BP 136/66 | HR 71 | Ht 65.0 in | Wt 226.0 lb

## 2021-03-19 DIAGNOSIS — E7800 Pure hypercholesterolemia, unspecified: Secondary | ICD-10-CM | POA: Diagnosis not present

## 2021-03-19 DIAGNOSIS — I5032 Chronic diastolic (congestive) heart failure: Secondary | ICD-10-CM | POA: Diagnosis not present

## 2021-03-19 DIAGNOSIS — T7840XA Allergy, unspecified, initial encounter: Secondary | ICD-10-CM

## 2021-03-19 DIAGNOSIS — I2510 Atherosclerotic heart disease of native coronary artery without angina pectoris: Secondary | ICD-10-CM | POA: Diagnosis not present

## 2021-03-19 MED ORDER — PRASUGREL HCL 10 MG PO TABS: 10.0000 mg | ORAL_TABLET | Freq: Every day | ORAL | 1 refills | Status: DC

## 2021-03-19 MED ORDER — ISOSORBIDE MONONITRATE ER 30 MG PO TB24: 30.0000 mg | ORAL_TABLET | Freq: Every day | ORAL | 3 refills | Status: DC

## 2021-03-19 NOTE — Patient Instructions (Signed)
Medication Instructions:   STOP Clopidogrel (Plavix)   START Isosorbide mononitrate (Imdur) 30 mg daily  START 03/20/21 Prasugrel (Effient) 10 mg daily.   *If you need a refill on your cardiac medications before your next appointment, please call your pharmacy*  Lab Work: NONE ordered at this time of appointment   If you have labs (blood work) drawn today and your tests are completely normal, you will receive your results only by: Marland Kitchen MyChart Message (if you have MyChart) OR . A paper copy in the mail If you have any lab test that is abnormal or we need to change your treatment, we will call you to review the results.  Testing/Procedures: NONE ordered at this time of appointment   Follow-Up: At [facility], you and your health needs are our priority.  As part of our continuing mission to provide you with exceptional heart care, we have created designated Provider Care Teams.  These Care Teams include your primary Cardiologist (physician) and Advanced Practice Providers (APPs -  Physician Assistants and Nurse Practitioners) who all work together to provide you with the care you need, when you need it.  Your next appointment:   As scheduled    The format for your next appointment:   In Person  Provider:   Laurann Montana, NP  Other Instructions

## 2021-03-19 NOTE — Telephone Encounter (Signed)
I think your plan is fine. We can reduce the effient down the road if she has trouble with bruising or bleeding. Thanks for seeing her. Ronalee Belts

## 2021-03-19 NOTE — Telephone Encounter (Signed)
Agree - all good thoughts. Makes sense to DC dilt first if this could be the issue. However, I've seen a lot more drug rash with plavix than any of these other medicines and suspect that is the issue. If symptoms don't improve with dilt discontinuation, would be reasonable to switch to Effient. Age 77 or greater is not an absolute contraindication, and the 5 mg dose could even be considered. I'm happy to follow-up with her on these recommendations.

## 2021-03-19 NOTE — Progress Notes (Signed)
Cardiology Office Note   Date:  03/19/2021   ID:  Donna Williamson, Donna Williamson May 25, 1946, MRN [id number]  PCP:  Crist Infante, MD  Cardiologist:  Dr. Burt Knack  No chief complaint on file.    History of Present Illness: Donna Williamson is a 75 y.o. female who presents for ongoing assessment and management of unstable angina with known history of coronary artery disease.  Patient had recent cardiac catheterization which demonstrated severe mid LAD and severe diagonal 2 stenosis.  The mid LAD was treated with a drug-eluting stent and the diagonal 2 was treated with POBA.  Echocardiogram revealed normal LV systolic function.  During hospitalization it was also noted that she had nocturnal desaturations and it was recommended that she have an outpatient sleep study.   Other history includes hypertension, hypercholesterolemia, chronic kidney disease stage IIIa, chronic diastolic heart failure.  She was last seen in the office on 03/08/2021 by Richardson Dopp, PA.  She had been placed on amlodipine 10 mg daily Lasix 40 mg daily.  She was complaining of a lot of lower extremity edema.  She was continued on clopidogrel metoprolol atorvastatin.  She was unable to tolerate amlodipine and she was placed back on diltiazem.  Metoprolol was decreased to 12.5 mg twice daily.  She called our office on 03/13/2021 with complaints of a rash, red splotches on her lower extremities with edema.  Protonix and clopidogrel with the only new medication she has been taking had had been taking them for a month.  There was a low suspicion that this was an allergic reaction and however she was placed on Benadryl and advised to follow-up with PCP.  She was to see her primary care Dr. Haynes Kerns, on 03/17/2021.  She continued weight gain.  Dr. Haynes Kerns felt that the rash was coming from the Plavix and she is here for this evaluation and need to change medication.  It was noted that she also has an allergy and intolerance to Brilinta.  Consideration  for use of Effient.  [name] speaks at length about all of her symptoms.  She describes dyspnea on exertion just walking across a parking lot or going upstairs which has been occurring over the last 3 weeks, the petechial rash bilateral lower extremities below the knee.  She states that she thought that she would be feeling much better as she did after having had her first stent placed but instead feels worse.  She is also complaining of some throat discomfort and trouble swallowing.  The rash is not pruritic, there are no vesicles seen.  Past Medical History:  Diagnosis Date  . Anemia   . Anginal pain ([place])   . Anxiety   . Arthritis    "feet, knees, back" (05/02/2016)  . Asthma   . CAD (coronary artery disease)    a. 04/2016: NSTEMI 95% stenosis 1st Mrg (s/p DES)  . Chest pain    a. 10/2013 Exercise Myoview: Ef 65%, no ischemia.  . CHF (congestive heart failure) ([place])   . Chronic bronchitis ([place])   . Chronic lower back pain   . COPD (chronic obstructive pulmonary disease) ([place])   . GERD (gastroesophageal reflux disease)   . History of blood transfusion 1960s   "related to my hysterectomy"  . Hypercholesterolemia   . Hypertension   . Lower extremity edema   . Mitral valve prolapse   . NSTEMI (non-ST elevated myocardial infarction) ([place]) 05/01/2016  . Pneumonia 05/01/2016   "saw trace of slight pneumonia/CT scan" (05/02/2016)  .  Seasonal allergies   . Sjogren's syndrome ([place])   . Wheezing     Past Surgical History:  Procedure Laterality Date  . ABDOMINAL HYSTERECTOMY  1960s   "partial"  . CARDIAC CATHETERIZATION N/A 05/02/2016   Procedure: Right/Left Heart Cath and Coronary Angiography;  Surgeon: Jettie Booze, MD;  Location: [facility];  Service: Cardiovascular;  Laterality: N/A;  . CARDIAC CATHETERIZATION N/A 05/02/2016   Procedure: Coronary Stent Intervention;  Surgeon: Jettie Booze, MD;  Location: [facility];  Service: Cardiovascular;  Laterality: N/A;   . CATARACT EXTRACTION W/PHACO Right 09/20/2019   Procedure: CATARACT EXTRACTION PHACO AND INTRAOCULAR LENS PLACEMENT ([place]) RIGHT;  Surgeon: Birder Robson, MD;  Location: [facility];  Service: Ophthalmology;  Laterality: Right;  [place] 01:29.4 CDE 19.28 FLUID PACK LOT # [id number]  . CATARACT EXTRACTION W/PHACO Left 10/15/2019   Procedure: CATARACT EXTRACTION PHACO AND INTRAOCULAR LENS PLACEMENT ([facility]) LEFT 8.76, 00:48.8;  Surgeon: Birder Robson, MD;  Location: [facility];  Service: Ophthalmology;  Laterality: Left;  . COLONOSCOPY W/ BIOPSIES AND POLYPECTOMY    . CORONARY ANGIOPLASTY WITH STENT PLACEMENT  05/02/2016   "1 stent"  . CORONARY STENT INTERVENTION N/A 02/17/2021   Procedure: CORONARY STENT INTERVENTION;  Surgeon: Burnell Blanks, MD;  Location: [facility];  Service: Cardiovascular;  Laterality: N/A;  . ESOPHAGOGASTRODUODENOSCOPY    . INTRAVASCULAR PRESSURE WIRE/FFR STUDY N/A 02/17/2021   Procedure: INTRAVASCULAR PRESSURE WIRE/FFR STUDY;  Surgeon: Burnell Blanks, MD;  Location: [facility];  Service: Cardiovascular;  Laterality: N/A;  . JOINT REPLACEMENT Left 2007   hip  . LAPAROSCOPIC SALPINGOOPHERECTOMY Bilateral ~ 1990  . LEFT HEART CATH AND CORONARY ANGIOGRAPHY N/A 02/17/2021   Procedure: LEFT HEART CATH AND CORONARY ANGIOGRAPHY;  Surgeon: Burnell Blanks, MD;  Location: [facility];  Service: Cardiovascular;  Laterality: N/A;  . TOTAL HIP ARTHROPLASTY Left 2007     Current Outpatient Medications  Medication Sig Dispense Refill  . albuterol (PROVENTIL) (2.5 MG/3ML) 0.083% nebulizer solution Take 3 mLs (2.5 mg total) by nebulization every 2 (two) hours as needed for wheezing or shortness of breath. 75 mL 2  . ALPRAZolam (XANAX) 0.5 MG tablet Take 0.5 mg by mouth daily as needed for anxiety.     Marland Kitchen aspirin EC 81 MG tablet Take 81 mg by mouth daily.    . Cholecalciferol (VITAMIN D3) 2000 UNITS TABS Take 2,000 Units by mouth  daily.    . cyanocobalamin 500 MCG tablet Take 1,000 mcg by mouth daily.    Marland Kitchen diltiazem (CARDIZEM CD) 240 MG 24 hr capsule Take 1 capsule (240 mg total) by mouth daily. 90 capsule 3  . EPIPEN 2-PAK 0.3 MG/0.3ML SOAJ injection Inject 0.3 mg into the skin daily as needed (allergic reaction).     Marland Kitchen FLUoxetine (PROZAC) 10 MG tablet Take 10 mg by mouth daily.    . fluticasone (FLONASE) 50 MCG/ACT nasal spray Place 2 sprays into the nose daily.    . furosemide (LASIX) 40 MG tablet Take 1 tablet (40 mg total) by mouth daily as needed for fluid or edema. (Patient taking differently: Take 80 mg by mouth daily.) 30 tablet 3  . gabapentin (NEURONTIN) 100 MG capsule Take 100 mg by mouth at bedtime.     . metoprolol tartrate (LOPRESSOR) 25 MG tablet Take 0.5 tablets (12.5 mg total) by mouth 2 (two) times daily. 90 tablet 3  . montelukast (SINGULAIR) 10 MG tablet Take 10 mg by mouth  at bedtime.    . Multiple Vitamin (MULTIVITAMIN WITH MINERALS) TABS tablet Take 1 tablet by mouth daily.    . nitroGLYCERIN (NITROSTAT) 0.4 MG SL tablet Place 1 tablet (0.4 mg total) under the tongue every 5 (five) minutes as needed for chest pain (up to 3 doses. If taking 3rd dose call 911). 25 tablet 3  . Olopatadine HCl 0.2 % SOLN Place 1 drop into both eyes daily as needed (allergies).    . pantoprazole (PROTONIX) 40 MG tablet Take 40 mg by mouth daily.    . Polyvinyl Alcohol (LIQUID TEARS OP) Place 1 drop into both eyes daily as needed (dry eyes).    . potassium chloride (KLOR-CON) 10 MEQ tablet Take 10 mEq by mouth 2 (two) times daily.    Marland Kitchen PROAIR HFA 108 (90 BASE) MCG/ACT inhaler Inhale 1-2 puffs into the lungs every 4 (four) hours as needed for wheezing.     . rosuvastatin (CRESTOR) 20 MG tablet Take 1 tablet (20 mg total) by mouth daily at 6 PM. (Patient taking differently: Take 20 mg by mouth every evening.) 30 tablet 0  . SYMBICORT 160-4.5 MCG/ACT inhaler Inhale 2 puffs into the lungs 2 (two) times daily.     No current  facility-administered medications for this visit.    Allergies:   Levaquin [levofloxacin], Plavix [clopidogrel bisulfate], Codeine, Drug ingredient [ticagrelor], Nitrofurantoin, Ciprofloxacin, Penicillins, and Sulfa antibiotics    Social History:  The patient  reports that she has never smoked. She has never used smokeless tobacco. She reports that she does not drink alcohol and does not use drugs.   Family History:  The patient's family history includes Heart disease in her father; Hypertension in her mother; Ovarian cancer in her sister; Stroke in her mother.    ROS: All other systems are reviewed and negative. Unless otherwise mentioned in H&P    PHYSICAL EXAM: VS:  BP 136/66   Pulse 71   Ht 5\' 5"  (1.651 m)   Wt 226 lb (102.5 kg)   SpO2 94%   BMI 37.61 kg/m  , BMI Body mass index is 37.61 kg/m. GEN: Well nourished, well developed, in no acute distress HEENT: normal Neck: no JVD, carotid bruits, or masses Cardiac: RRR; no murmurs, rubs, or gallops,no edema  Respiratory:  Clear to auscultation bilaterally, normal work of breathing GI: soft, nontender, nondistended, + BS MS: no deformity or atrophy Skin: warm and dry, no rash, bilateral petechial rash below the knees into the feet.  (See photo taken located in media) Neuro:  Strength and sensation are intact Psych: euthymic mood, full affect   EKG: Not completed this office visit.  Recent Labs: 02/16/2021: TSH 2.980 02/17/2021: ALT 16 02/18/2021: Hemoglobin 11.1; Magnesium 2.2; Platelets 258 03/08/2021: BUN 14; Creatinine, Ser 1.13; Potassium 4.1; Sodium 143    Lipid Panel    Component Value Date/Time   CHOL 131 02/17/2021 0154   TRIG 132 02/17/2021 0154   HDL 46 02/17/2021 0154   CHOLHDL 2.8 02/17/2021 0154   VLDL 26 02/17/2021 0154   LDLCALC 59 02/17/2021 0154      Wt Readings from Last 3 Encounters:  03/19/21 226 lb (102.5 kg)  03/08/21 223 lb (101.2 kg)  02/18/21 221 lb 9.6 oz (100.5 kg)      Other  studies Reviewed: Prior CV studies: LEFT HEART CATH 02/17/2021 Narrative  Ost 2nd Diag to 2nd Diag lesion is 70% stenosed.  Previously placed 1st Mrg stent (unknown type) is widely patent.  Prox Cx to  Mid Cx lesion is 40% stenosed.  Prox LAD to Mid LAD lesion is 40% stenosed.  2nd Diag lesion is 80% stenosed.  Mid LAD lesion is 80% stenosed.  Post intervention, there is a 80% residual stenosis.  A drug-eluting stent was successfully placed using a SYNERGY XD 2.50X16.  Post intervention, there is a 0% residual stenosis.  1. Moderate, calcified proximal LAD stenosis. 2. Severe mid LAD stenosis at the takeoff of the moderate caliber second Diagonal branch. RFR of the mid LAD lesion was 0.84 suggesting the lesion was flow limiting. The ostium of the moderate caliber second Diagonal branch had a severe stenosis. 3. Patent obtuse marginal stent 4. Non-dominant RCA with mild plaque 5. Successful balloon angioplasty of the ostium of the Diagonal branch. This was done prior to stenting of the LAD. TIMI-3 flow down the Diagonal branch post LAD stenting. 6. Successful PTCA/DES x 1 mid LAD. 7. Preserved LV systolic function.  Recommendations: Will continue DAPT with ASA and Plavix for at least six months. Continue statin. I would expect that she will be watched overnight and discharged home tomorrow.   Echocardiogram 02/18/21 GLS -17.4; EF 60-65, no RWMA, normal RVSF, mild LAE, trivial MR, severe nodular calcification of AV L coronary cusp, no AS  GATED SPECT MYO PERF W/LEXISCAN STRESS 2D 01/20/2020 Narrative  Nuclear stress EF: 54%.  The left ventricular ejection fraction is mildly decreased (45-54%).  There was no ST segment deviation noted during stress.  Diaphragmatic attenuation,  No ischemia noted, Low risk study.   LONG TERM MONITOR (3-7 DAYS) INTERPRETATION 06/13/2019 Narrative 1) The basic rhythm is normal sinus with an average HR of 67 bpm 2) Occasional short runs  of SVT 8-10 beats 3) Occasional PVC's with burden < 1% 4) No sustained arrhythmia, bradycardic events, or pathologic pauses   Carotid US 3/15 Normal bilateral carotid arteries    ASSESSMENT AND PLAN:  1.  Allergic reaction to Plavix: I have spoken with Dr. Carlena Hurl who is DOD today and [place] line concerning this patient's case and her allergy.  She was intolerant of Brilinta as well causing anaphylaxis.  We will plan on changing her to Effient 10 mg daily which she will start tomorrow, no loading dose is required as she is in the late phase of antiplatelet therapy.  We will discontinue Plavix.  She has an appointment on April 05, 2021 for follow-up which I have asked her to keep.  If rash becomes worse, or she has issues with breathing, she is to report this to [place] and likely will need to be seen in the ED.  Plavix has been added to her allergy list.  2.  Coronary artery disease: Most recent cardiac catheterization on 02/18/2021 revealing mid LAD stenosis requiring drug-eluting stent, and severe diagonal 2 stenosis.  This was treated with POBA.  She is having some shortness of breath and some chest discomfort.  She does not feel any better since having the stent placed.  I am not certain if this is related to her allergic reaction to Plavix versus cardiac issue.  She may need to have a relook with cardiac cath to evaluate that second diagonal branch if her symptoms continue to be persistent after taking her off Plavix.  In the interim I have started her back on isosorbide 30 mg daily.  She is to continue her other medications as directed.  3.  Hyperlipidemia: She is to continue on rosuvastatin 20 mg daily, goal of LDL less than 70.  4.  Chronic lower extremity edema: She remains on Lasix 40 mg 2 in the morning and 1 in the afternoon which has been helpful.  May need to consider changing her to a different diuretic that does not have sulfa, as this is in her list of allergies.  There is concerned that  this sulfa allergy is playing a part in some of this.   Current medicines are reviewed at length with the patient today.  I have spent 45 minutes  dedicated to the care of this patient on the date of this encounter to include pre-visit review of records, assessment, management and diagnostic testing,with shared decision making.  Labs/ tests ordered today include: None Phill Myron. West Pugh, ANP, [facility]   03/19/2021 10:57 AM    [facility] [address] Office [phone] Fax [phone]  Notice: This dictation was prepared with Dragon dictation along with smaller phrase technology. Any transcriptional errors that result from this process are unintentional and may not be corrected upon review.

## 2021-03-19 NOTE — Telephone Encounter (Signed)
I called and spoke with Mrs. Musick and asked her to start the Effient at 5 mg daily instead of 10 mg daily. She verbalized understanding. She will call us if she has any new or worsening symptoms. She will start the lower dose on 5 /24/2022, and has a follow up on June 6th.   Curt Bears

## 2021-03-24 DIAGNOSIS — E785 Hyperlipidemia, unspecified: Secondary | ICD-10-CM | POA: Diagnosis not present

## 2021-03-24 DIAGNOSIS — R0600 Dyspnea, unspecified: Secondary | ICD-10-CM | POA: Diagnosis not present

## 2021-03-24 DIAGNOSIS — N1832 Chronic kidney disease, stage 3b: Secondary | ICD-10-CM | POA: Diagnosis not present

## 2021-03-24 DIAGNOSIS — I2510 Atherosclerotic heart disease of native coronary artery without angina pectoris: Secondary | ICD-10-CM | POA: Diagnosis not present

## 2021-03-24 DIAGNOSIS — I129 Hypertensive chronic kidney disease with stage 1 through stage 4 chronic kidney disease, or unspecified chronic kidney disease: Secondary | ICD-10-CM | POA: Diagnosis not present

## 2021-03-30 ENCOUNTER — Ambulatory Visit: Payer: Medicare PPO | Admitting: Cardiology

## 2021-04-05 ENCOUNTER — Other Ambulatory Visit: Payer: Self-pay

## 2021-04-05 ENCOUNTER — Encounter: Payer: Self-pay | Admitting: Family

## 2021-04-05 ENCOUNTER — Ambulatory Visit: Payer: Medicare PPO | Admitting: Family

## 2021-04-05 VITALS — BP 116/70 | HR 67 | Ht 65.0 in | Wt 219.8 lb

## 2021-04-05 DIAGNOSIS — I10 Essential (primary) hypertension: Secondary | ICD-10-CM | POA: Diagnosis not present

## 2021-04-05 DIAGNOSIS — I5032 Chronic diastolic (congestive) heart failure: Secondary | ICD-10-CM | POA: Diagnosis not present

## 2021-04-05 DIAGNOSIS — E785 Hyperlipidemia, unspecified: Secondary | ICD-10-CM | POA: Diagnosis not present

## 2021-04-05 DIAGNOSIS — G4730 Sleep apnea, unspecified: Secondary | ICD-10-CM | POA: Diagnosis not present

## 2021-04-05 DIAGNOSIS — I25118 Atherosclerotic heart disease of native coronary artery with other forms of angina pectoris: Secondary | ICD-10-CM | POA: Diagnosis not present

## 2021-04-05 MED ORDER — PRASUGREL HCL 5 MG PO TABS: 5.0000 mg | ORAL_TABLET | Freq: Every day | ORAL | 1 refills | Status: DC

## 2021-04-05 NOTE — Progress Notes (Signed)
Office Visit    Patient Name: Donna Williamson Date of Encounter: 04/05/2021  PCP:  Donna Williamson, [place]  Cardiologist:  Donna Mocha, MD  Advanced Practice Provider:  No care team member to display Electrophysiologist:  None   Chief Complaint    Donna Williamson is a 75 y.o. female with a hx of coronary artery disease s/p DES to LAD and diagonal continue treated with POBA 02/17/21, hypertension, hyperlipidemia, chronic kidney disease 3A, chronic diastolic heart failure presents today for edema   Past Medical History    Past Medical History:  Diagnosis Date  . Anemia   . Anginal pain ([place])   . Anxiety   . Arthritis    "feet, knees, back" (05/02/2016)  . Asthma   . CAD (coronary artery disease)    a. 04/2016: NSTEMI 95% stenosis 1st Mrg (s/p DES)  . Chest pain    a. 10/2013 Exercise Myoview: Ef 65%, no ischemia.  . CHF (congestive heart failure) ([place])   . Chronic bronchitis ([place])   . Chronic lower back pain   . COPD (chronic obstructive pulmonary disease) ([place])   . GERD (gastroesophageal reflux disease)   . History of blood transfusion 1960s   "related to my hysterectomy"  . Hypercholesterolemia   . Hypertension   . Lower extremity edema   . Mitral valve prolapse   . NSTEMI (non-ST elevated myocardial infarction) ([place]) 05/01/2016  . Pneumonia 05/01/2016   "saw trace of slight pneumonia/CT scan" (05/02/2016)  . Seasonal allergies   . Sjogren's syndrome ([place])   . Wheezing    Past Surgical History:  Procedure Laterality Date  . ABDOMINAL HYSTERECTOMY  1960s   "partial"  . CARDIAC CATHETERIZATION N/A 05/02/2016   Procedure: Right/Left Heart Cath and Coronary Angiography;  Surgeon: Donna Booze, MD;  Location: [facility];  Service: Cardiovascular;  Laterality: N/A;  . CARDIAC CATHETERIZATION N/A 05/02/2016   Procedure: Coronary Stent Intervention;  Surgeon: Donna Booze, MD;  Location: [facility];  Service:  Cardiovascular;  Laterality: N/A;  . CATARACT EXTRACTION W/PHACO Right 09/20/2019   Procedure: CATARACT EXTRACTION PHACO AND INTRAOCULAR LENS PLACEMENT ([place]) RIGHT;  Surgeon: Donna Robson, MD;  Location: [facility];  Service: Ophthalmology;  Laterality: Right;  [place] 01:29.4 CDE 19.28 FLUID PACK LOT # [id number]  . CATARACT EXTRACTION W/PHACO Left 10/15/2019   Procedure: CATARACT EXTRACTION PHACO AND INTRAOCULAR LENS PLACEMENT ([facility]) LEFT 8.76, 00:48.8;  Surgeon: Donna Robson, MD;  Location: [facility];  Service: Ophthalmology;  Laterality: Left;  . COLONOSCOPY W/ BIOPSIES AND POLYPECTOMY    . CORONARY ANGIOPLASTY WITH STENT PLACEMENT  05/02/2016   "1 stent"  . CORONARY STENT INTERVENTION N/A 02/17/2021   Procedure: CORONARY STENT INTERVENTION;  Surgeon: Donna Blanks, MD;  Location: [facility];  Service: Cardiovascular;  Laterality: N/A;  . ESOPHAGOGASTRODUODENOSCOPY    . INTRAVASCULAR PRESSURE WIRE/FFR STUDY N/A 02/17/2021   Procedure: INTRAVASCULAR PRESSURE WIRE/FFR STUDY;  Surgeon: Donna Blanks, MD;  Location: [facility];  Service: Cardiovascular;  Laterality: N/A;  . JOINT REPLACEMENT Left 2007   hip  . LAPAROSCOPIC SALPINGOOPHERECTOMY Bilateral ~ 1990  . LEFT HEART CATH AND CORONARY ANGIOGRAPHY N/A 02/17/2021   Procedure: LEFT HEART CATH AND CORONARY ANGIOGRAPHY;  Surgeon: Donna Blanks, MD;  Location: [facility];  Service: Cardiovascular;  Laterality: N/A;  . TOTAL HIP ARTHROPLASTY Left 2007    Allergies  Allergies  Allergen Reactions  .  Levaquin [Levofloxacin] Anaphylaxis  . Plavix [Clopidogrel Bisulfate] Rash  . Codeine Nausea Only  . Drug Ingredient [Ticagrelor] Other (See Comments)    Patient reports hemorrhage after medical trial of brilinta in 2017  . Nitrofurantoin Other (See Comments)    unknown VISION CHANGES  . Ciprofloxacin Rash  . Penicillins Rash    Has patient had a PCN reaction causing immediate  rash, facial/tongue/throat swelling, SOB or lightheadedness with hypotension: No Has patient had a PCN reaction causing severe rash involving mucus membranes or skin necrosis: No Has patient had a PCN reaction that required hospitalization: No Has patient had a PCN reaction occurring within the last 10 years: No If all of the above answers are "NO", then may proceed with Cephalosporin use.  . Sulfa Antibiotics Rash    History of Present Illness    Donna Williamson is a 75 y.o. female with a hx of coronary artery disease s/p DES to LAD and diagonal continue treated with POBA 02/17/21, hypertension, hyperlipidemia, chronic kidney disease 3A, chronic diastolic heart failure last seen 03/19/21 by Donna Sims, NP.  Recent coronary stent intervention 02/17/2021 with mid LAD stenosis treated with stent and severe diagonal 2 disease treated with POBA.  Echo with normal LVEF.  She was noted to have nocturnal desaturations during hospitalizations recommended for outpatient surgery.  She was seen in clinic 03/08/2021 noting lower extremity edema.  Her amlodipine was discontinued.  She was placed back on diltiazem and metoprolol decreased to 12.5 mg twice daily.  She contacted the office 03/14/2021 noting rash with having been on clopidogrel and Protonix for 1 month.  She was seen by Donna Sims, NP 03/19/2021 and after discussion with DOD Plavix was discontinued.  Noted previous intolerance with anaphylaxis to Brilinta.  She was transitioned to Effient. As she is >48 yo, 5mg  dose was utilized.  Imdur added for shortness of breath and chest discomfort.  She presents today for follow-up with her husband. Rash resolved after transition to Prasugrel. Tolerating without difficulty. No recurrent chest pain since addition of Imdur. Still with stable dyspnea on exertion. No orthopnea, PND, edema. She is overall sedentary with no exercise routine. Plans to participate in cardiac rehab and is waiting for a spot to  open. Following low salt diet very carefully. No lightheadedness, dizziness.    STOP BANG RISK ASSESSMENT S (snore) Have you been told that you snore?                                       NO   T (tired) Are you often tired, fatigued, or sleepy during the day?             no NO  O (obstruction) Do you stop breathing, choke, or gasp during sleep?  NO   P (pressure) Do you have or are you being treated for high blood pressure? YES   B (BMI) Is your body index greater than 35 kg/m? YES   A (age) Are you 60 years old or older? YES   N (neck) Do you have a neck circumference greater than 16 inches?   NO   G (gender) Are you a female? NO   TOTAL STOP/BANG "YES" ANSWERS 3     EKGs/Labs/Other Studies Reviewed:   The following studies were reviewed today: LEFT HEART CATH 02/17/2021 Narrative  Ost 2nd Diag to 2nd Diag lesion is 70% stenosed.  Previously placed  1st Mrg stent (unknown type) is widely patent.  Prox Cx to Mid Cx lesion is 40% stenosed.  Prox LAD to Mid LAD lesion is 40% stenosed.  2nd Diag lesion is 80% stenosed.  Mid LAD lesion is 80% stenosed.  Post intervention, there is a 80% residual stenosis.  A drug-eluting stent was successfully placed using a SYNERGY XD [id number].  Post intervention, there is a 0% residual stenosis.   1. Moderate, calcified proximal LAD stenosis. 2. Severe mid LAD stenosis at the takeoff of the moderate caliber second Diagonal branch. RFR of the mid LAD lesion was 0.84 suggesting the lesion was flow limiting. The ostium of the moderate caliber second Diagonal branch had a severe stenosis. 3. Patent obtuse marginal stent 4. Non-dominant RCA with mild plaque 5. Successful balloon angioplasty of the ostium of the Diagonal branch. This was done prior to stenting of the LAD. TIMI-3 flow down the Diagonal branch post LAD stenting. 6. Successful PTCA/DES x 1 mid LAD. 7. Preserved LV systolic function.   Recommendations: Will continue  DAPT with ASA and Plavix for at least six months. Continue statin. I would expect that she will be watched overnight and discharged home tomorrow.    Echocardiogram 02/18/21 GLS -17.4; EF 60-65, no RWMA, normal RVSF, mild LAE, trivial MR, severe nodular calcification of AV L coronary cusp, no AS   GATED SPECT MYO PERF W/LEXISCAN STRESS 2D 01/20/2020 Narrative  Nuclear stress EF: 54%.  The left ventricular ejection fraction is mildly decreased (45-54%).  There was no ST segment deviation noted during stress.  Diaphragmatic attenuation,  No ischemia noted, Low risk study.   LONG TERM MONITOR (3-7 DAYS) INTERPRETATION 06/13/2019 Narrative 1) The basic rhythm is normal sinus with an average HR of 67 bpm 2) Occasional short runs of SVT 8-10 beats 3) Occasional PVC's with burden < 1% 4) No sustained arrhythmia, bradycardic events, or pathologic pauses   Carotid US 3/15 Normal bilateral carotid arteries  EKG:  No EKG today.  Recent Labs: 02/16/2021: TSH 2.980 02/17/2021: ALT 16 02/18/2021: Hemoglobin 11.1; Magnesium 2.2; Platelets 258 03/08/2021: BUN 14; Creatinine, Ser 1.13; Potassium 4.1; Sodium 143  Recent Lipid Panel    Component Value Date/Time   CHOL 131 02/17/2021 0154   TRIG 132 02/17/2021 0154   HDL 46 02/17/2021 0154   CHOLHDL 2.8 02/17/2021 0154   VLDL 26 02/17/2021 0154   LDLCALC 59 02/17/2021 0154    Home Medications   Current Meds  Medication Sig  . albuterol (PROVENTIL) (2.5 MG/3ML) 0.083% nebulizer solution Take 3 mLs (2.5 mg total) by nebulization every 2 (two) hours as needed for wheezing or shortness of breath.  . ALPRAZolam (XANAX) 0.5 MG tablet Take 0.5 mg by mouth daily as needed for anxiety.   Marland Kitchen aspirin EC 81 MG tablet Take 81 mg by mouth daily.  . Cholecalciferol (VITAMIN D3) 2000 UNITS TABS Take 2,000 Units by mouth daily.  . cyanocobalamin 500 MCG tablet Take 1,000 mcg by mouth daily.  Marland Kitchen diltiazem (CARDIZEM CD) 240 MG 24 hr capsule Take 1 capsule  (240 mg total) by mouth daily.  Marland Kitchen EPIPEN 2-PAK 0.3 MG/0.3ML SOAJ injection Inject 0.3 mg into the skin daily as needed (allergic reaction).   Marland Kitchen FLUoxetine (PROZAC) 10 MG tablet Take 10 mg by mouth daily.  . fluticasone (FLONASE) 50 MCG/ACT nasal spray Place 2 sprays into the nose daily.  . fluticasone-salmeterol (ADVAIR) 250-50 MCG/ACT AEPB 1 puff  . furosemide (LASIX) 40 MG tablet Take 1 tablet (40 mg  total) by mouth daily as needed for fluid or edema.  . gabapentin (NEURONTIN) 100 MG capsule Take 100 mg by mouth at bedtime.   . isosorbide mononitrate (IMDUR) 30 MG 24 hr tablet Take 1 tablet (30 mg total) by mouth daily.  . metoprolol tartrate (LOPRESSOR) 25 MG tablet Take 0.5 tablets (12.5 mg total) by mouth 2 (two) times daily.  . montelukast (SINGULAIR) 10 MG tablet Take 10 mg by mouth at bedtime.  . Multiple Vitamin (MULTIVITAMIN WITH MINERALS) TABS tablet Take 1 tablet by mouth daily.  . nitroGLYCERIN (NITROSTAT) 0.4 MG SL tablet Place 1 tablet (0.4 mg total) under the tongue every 5 (five) minutes as needed for chest pain (up to 3 doses. If taking 3rd dose call 911).  . Olopatadine HCl 0.2 % SOLN Place 1 drop into both eyes daily as needed (allergies).  . pantoprazole (PROTONIX) 40 MG tablet Take 40 mg by mouth daily.  . Polyvinyl Alcohol (LIQUID TEARS OP) Place 1 drop into both eyes daily as needed (dry eyes).  . potassium chloride (KLOR-CON) 10 MEQ tablet Take 10 mEq by mouth 2 (two) times daily.  . prasugrel (EFFIENT) 5 MG TABS tablet Take 1 tablet (5 mg total) by mouth daily.  Marland Kitchen PROAIR HFA 108 (90 BASE) MCG/ACT inhaler Inhale 1-2 puffs into the lungs every 4 (four) hours as needed for wheezing.   . rosuvastatin (CRESTOR) 20 MG tablet Take 1 tablet (20 mg total) by mouth daily at 6 PM.  . [DISCONTINUED] prasugrel (EFFIENT) 10 MG TABS tablet Take 10 mg by mouth daily. Per patient taking 1/2 tablet of 10 mg tablet     Review of Systems  All other systems reviewed and are otherwise  negative except as noted above.  Physical Exam    VS:  BP 116/70   Pulse 67   Ht 5\' 5"  (1.651 m)   Wt 219 lb 12.8 oz (99.7 kg)   SpO2 94%   BMI 36.58 kg/m  , BMI Body mass index is 36.58 kg/m.  Wt Readings from Last 3 Encounters:  04/05/21 219 lb 12.8 oz (99.7 kg)  03/19/21 226 lb (102.5 kg)  03/08/21 223 lb (101.2 kg)     GEN: Well nourished, overweight, well developed, in no acute distress. HEENT: normal. Neck: Supple, no JVD, carotid bruits, or masses. Cardiac: RRR, no murmurs, rubs, or gallops. No clubbing, cyanosis, edema.  Radials/PT 2+ and equal bilaterally.  Respiratory:  Respirations regular and unlabored, clear to auscultation bilaterally. GI: Soft, nontender, nondistended. MS: No deformity or atrophy. Skin: Warm and dry, no rash. Neuro:  Strength and sensation are intact. Psych: Normal affect.  Assessment & Plan    1. CAD - Cardiac cath 02/18/21 with mid LAD stenosis requiring DES and severe diagonal 2 stenosis treated with POBA. Allergic to Plavix with rash, intolerant to Brilinta with anaphylaxis -  started on Effient at most recent clinic visit. Tolerating without difficulty. Continue Effient 5mg  daily (reduced dose due to age) - refill provided. Additional GDMT includes aspirin, metoprolol. Rosuvastatin, Imdur. Chest pain resolved since addition of Imdur. Still with exertional dyspnea likely deconditioning. Encouraged to participate in cardiac rehab. BNP to rule out excess volume as contributory to dyspnea. Heart healthy diet and regular cardiovascular exercise encouraged.   2. HLD, LDL goal <70 - Continue Rosuvastatin 20mg  daily.   3. Chronic lower extremity edema / Diastolic heart failure - Well controlled on lasix 40mg  QD. BMP, BNP for monitoring. Low salt diet encouraged. Encouraged compression and elevation.  4. HTN - BP well controlled. Continue current antihypertensive regimen.   5. Sleep disordered breathing - Plan for [place] WatchPAT. Stop Bang score of  3. Symptomatic with sleep disordered breathing. History of hypertension and diastolic heart failure. Also noted apneic episodes and nocturnal desaturations during hospitalization.   6. Obesity - Weight loss via diet and exercise encouraged. Discussed the impact being overweight would have on cardiovascular risk.  Disposition: Follow up in 3 month(s) with Dr. Burt Knack or APP.    Signed, Loel Dubonnet, NP 04/05/2021, 4:56 PM [facility]

## 2021-04-05 NOTE — Patient Instructions (Addendum)
Medication Instructions:  Your provider has recommended you make the following change in your medication:  We have sent a prescription for Praluent 5mg  tablet - one tablet daily  *If you need a refill on your cardiac medications before your next appointment, please call your pharmacy*   Lab Work: Your provider recommends lab work today: BMP, BNP  If you have labs (blood work) drawn today and your tests are completely normal, you will receive your results only by: Marland Kitchen MyChart Message (if you have MyChart) OR . A paper copy in the mail If you have any lab test that is abnormal or we need to change your treatment, we will call you to review the results.   Testing/Procedures: None ordered today.  Follow-Up: At [facility], you and your health needs are our priority.  As part of our continuing mission to provide you with exceptional heart care, we have created designated Provider Care Teams.  These Care Teams include your primary Cardiologist (physician) and Advanced Practice Providers (APPs -  Physician Assistants and Nurse Practitioners) who all work together to provide you with the care you need, when you need it.  We recommend signing up for the patient portal called "MyChart".  Sign up information is provided on this After Visit Summary.  MyChart is used to connect with patients for Virtual Visits (Telemedicine).  Patients are able to view lab/test results, encounter notes, upcoming appointments, etc.  Non-urgent messages can be sent to your provider as well.   To learn more about what you can do with MyChart, go to [email or web address].    Your next appointment:   4 month(s) with Dr. Burt Knack on Friday, October 7 @ 3:20 pm.   The format for your next appointment:   In Person  Provider:   You may see Sherren Mocha, MD or one of the following Advanced Practice Providers on your designated Care Team:    Other Instructions  Loel Dubonnet, NP will reach out to our sleep study  coordinators to get an update. We will give you a call with more info later this week.   Heart Healthy Diet Recommendations: A low-salt diet is recommended. Meats should be grilled, baked, or boiled. Avoid fried foods. Focus on lean protein sources like fish or chicken with vegetables and fruits. The American Heart Association is a Microbiologist!  American Heart Association Diet and Lifeystyle Recommendations   Exercise recommendations: The American Heart Association recommends 150 minutes of moderate intensity exercise weekly. Try 30 minutes of moderate intensity exercise 4-5 times per week. This could include walking, jogging, or swimming.

## 2021-04-06 ENCOUNTER — Telehealth: Payer: Self-pay | Admitting: Cardiovascular Disease

## 2021-04-06 DIAGNOSIS — I10 Essential (primary) hypertension: Secondary | ICD-10-CM

## 2021-04-06 LAB — BASIC METABOLIC PANEL
BUN/Creatinine Ratio: 16 (ref 12–28)
BUN: 22 mg/dL (ref 8–27)
CO2: 16 mmol/L — ABNORMAL LOW (ref 20–29)
Calcium: 9.5 mg/dL (ref 8.7–10.3)
Chloride: 101 mmol/L (ref 96–106)
Creatinine, Ser: 1.34 mg/dL — ABNORMAL HIGH (ref 0.57–1.00)
Glucose: 102 mg/dL — ABNORMAL HIGH (ref 65–99)
Potassium: 4.9 mmol/L (ref 3.5–5.2)
Sodium: 139 mmol/L (ref 134–144)
eGFR: 41 mL/min/{1.73_m2} — ABNORMAL LOW (ref >59)

## 2021-04-06 LAB — PRO B NATRIURETIC PEPTIDE: NT-Pro BNP: 648 pg/mL (ref 0–738)

## 2021-04-06 NOTE — Telephone Encounter (Signed)
Patient returning call in regards to lab results.

## 2021-04-07 DIAGNOSIS — G8929 Other chronic pain: Secondary | ICD-10-CM | POA: Diagnosis not present

## 2021-04-07 DIAGNOSIS — M1712 Unilateral primary osteoarthritis, left knee: Secondary | ICD-10-CM | POA: Diagnosis not present

## 2021-04-07 MED ORDER — FUROSEMIDE 40 MG PO TABS: 40.0000 mg | ORAL_TABLET | ORAL | 11 refills | Status: DC

## 2021-04-07 NOTE — Telephone Encounter (Signed)
Reviewed results with patient who verbalized understanding.   Instructed the patient to DECREASE LASIX to every other day. BMET scheduled 04/23/21. The patient was grateful for call and agrees with plan.

## 2021-04-07 NOTE — Telephone Encounter (Signed)
-----   Message from Loel Dubonnet, NP sent at 04/06/2021  7:52 AM EDT ----- ProBNP with no evidence of volume overload. Good result! Normal electrolytes. Kidney function worse than baseline suggestive of dehydration. Reduce Lasix from every day to every other day with repeat BMP in 2 weeks.

## 2021-04-09 ENCOUNTER — Telehealth: Payer: Self-pay | Admitting: *Deleted

## 2021-04-09 NOTE — Telephone Encounter (Signed)
I s/w the pt and and let her know her insurance did not need a PA for the [facility] Sleep Study. Pt is agreeable to do sleep study either tonight 6/10 or tomorrow night 04/10/21. I apologized to the pt for our delay in getting the PA and getting her the PIN# [id number]. Pt thanked me for the call and the help. Assured the pt once the results are in and have been read by the MD we will let her know the results.

## 2021-04-11 ENCOUNTER — Encounter (INDEPENDENT_AMBULATORY_CARE_PROVIDER_SITE_OTHER): Payer: Medicare PPO | Admitting: Cardiology

## 2021-04-11 DIAGNOSIS — G4733 Obstructive sleep apnea (adult) (pediatric): Secondary | ICD-10-CM | POA: Diagnosis not present

## 2021-04-12 ENCOUNTER — Ambulatory Visit: Payer: Medicare PPO

## 2021-04-12 DIAGNOSIS — N1831 Chronic kidney disease, stage 3a: Secondary | ICD-10-CM

## 2021-04-12 DIAGNOSIS — E7800 Pure hypercholesterolemia, unspecified: Secondary | ICD-10-CM

## 2021-04-12 DIAGNOSIS — I2510 Atherosclerotic heart disease of native coronary artery without angina pectoris: Secondary | ICD-10-CM

## 2021-04-12 DIAGNOSIS — R0683 Snoring: Secondary | ICD-10-CM

## 2021-04-12 DIAGNOSIS — I10 Essential (primary) hypertension: Secondary | ICD-10-CM

## 2021-04-12 DIAGNOSIS — G4734 Idiopathic sleep related nonobstructive alveolar hypoventilation: Secondary | ICD-10-CM

## 2021-04-12 DIAGNOSIS — I5032 Chronic diastolic (congestive) heart failure: Secondary | ICD-10-CM

## 2021-04-12 NOTE — Procedures (Signed)
   Sleep Study Report  Patient Information Study Date: Apr 11, 2021 Patient Name: Donna Williamson Patient ID: [id number] Birth Date: June 27, 1946 Age: 75 Gender: Female BMI: 37.5 (W=225 lb, H=5' 5'') Referring Physician: Richardson Dopp, PA  TEST DESCRIPTION: Home sleep apnea testing was completed using the WatchPat, a Type 1 device, utilizing  peripheral arterial tonometry (PAT), chest movement, actigraphy, pulse oximetry, pulse rate, body position and snore.  AHI was calculated with apnea and hypopnea using valid sleep time as the denominator. RDI includes apneas,  hypopneas, and RERAs. The data acquired and the scoring of sleep and all associated events were performed in  accordance with the recommended standards and specifications as outlined in the AASM Manual for the Scoring of  Sleep and Associated Events 2.2.0 (2015).  FINDINGS: 1. Moderate Obstructive Sleep Apnea with AHI 16.3/hr.  2. No Central Sleep Apnea with pAHIc 0.5/hr. 3. Oxygen desaturations as low as 82%. 4. Mild snoring was present. O2 sats were < 88% for 13.4 min. 5. Total sleep time was 6 hrs and 5 min. 6. 23.1% of total sleep time was spent in REM sleep.  7. Normal sleep onset latency at 17 min.  8. Normal REM sleep onset latency at 96 min.  9. Total awakenings were 3.   DIAGNOSIS:  Moderate Obstructive Sleep Apnea (G47.33) Nocturnal Hypoxemia  RECOMMENDATIONS:  1. Clinical correlation of these findings is necessary. The decision to treat obstructive sleep apnea (OSA) is usually  based on the presence of apnea symptoms or the presence of associated medical conditions such as Hypertension,  Congestive Heart Failure, Atrial Fibrillation or Obesity. The most common symptoms of OSA are snoring, gasping for  breath while sleeping, daytime sleepiness and fatigue.   2. Initiating apnea therapy is recommended given the presence of symptoms and/or associated conditions.  Recommend proceeding with one of the  following:   a. Auto-CPAP therapy with a pressure range of 5-20cm H2O.   b. An oral appliance (OA) that can be obtained from certain dentists with expertise in sleep medicine. These are  primarily of use in non-obese patients with mild and moderate disease.   c. An ENT consultation which may be useful to look for specific causes of obstruction and possible treatment  options.   d. If patient is intolerant to PAP therapy, consider referral to ENT for evaluation for hypoglossal nerve stimulator.   3. Close follow-up is necessary to ensure success with CPAP or oral appliance therapy for maximum benefit .  4. A follow-up oximetry study on CPAP is recommended to assess the adequacy of therapy and determine the need  for supplemental oxygen or the potential need for Bi-level therapy. An arterial blood gas to determine the adequacy of  baseline ventilation and oxygenation should also be considered.  5. Healthy sleep recommendations include: adequate nightly sleep (normal 7-9 hrs/night), avoidance of caffeine after  noon and alcohol near bedtime, and maintaining a sleep environment that is cool, dark and quiet.  6. Weight loss for overweight patients is recommended. Even modest amounts of weight loss can significantly  improve the severity of sleep apnea.  7. Snoring recommendations include: weight loss where appropriate, side sleeping, and avoidance of alcohol before  bed.  8. Operation of motor vehicle or dangerous equipment must be avoided when feeling drowsy, excessively sleepy, or  mentally fatigued.  Signature:  Fransico Him, MD; [facility]; [place], [place] Board of Sleep Medicine Electronically Signed: Apr 12, 2021

## 2021-04-16 ENCOUNTER — Telehealth: Payer: Self-pay | Admitting: *Deleted

## 2021-04-16 DIAGNOSIS — I10 Essential (primary) hypertension: Secondary | ICD-10-CM

## 2021-04-16 DIAGNOSIS — G4733 Obstructive sleep apnea (adult) (pediatric): Secondary | ICD-10-CM

## 2021-04-16 NOTE — Telephone Encounter (Addendum)
Procedure is not authorized.Clinicals requested and uploaded successfully via the web portal.

## 2021-04-16 NOTE — Telephone Encounter (Signed)
The patient has been notified of the result and verbalized understanding.  All questions (if any) were answered. Marolyn Hammock, [place] 04/16/2021 9:13 AM    Titration sent to [facility].

## 2021-04-16 NOTE — Telephone Encounter (Signed)
IInformed patient of sleep study results and patient understanding was verbalized. Patient understands her sleep study showed sleep apnea and the next step is a cpap titration.

## 2021-04-16 NOTE — Telephone Encounter (Signed)
-----   Message from Sueanne Margarita, MD sent at 04/12/2021  6:08 PM EDT ----- Please let patient know that they have sleep apnea.  Recommend therapeutic CPAP titration for treatment of patient's sleep disordered breathing.  If unable to perform an in lab titration then initiate ResMed auto CPAP from 4 to 15cm H2O with heated humidity and mask of choice and overnight pulse ox on CPAP.

## 2021-04-23 ENCOUNTER — Other Ambulatory Visit: Payer: Medicare PPO | Admitting: *Deleted

## 2021-04-23 ENCOUNTER — Other Ambulatory Visit: Payer: Self-pay

## 2021-04-23 DIAGNOSIS — I10 Essential (primary) hypertension: Secondary | ICD-10-CM

## 2021-04-23 LAB — BASIC METABOLIC PANEL
BUN/Creatinine Ratio: 16 (ref 12–28)
BUN: 23 mg/dL (ref 8–27)
CO2: 18 mmol/L — ABNORMAL LOW (ref 20–29)
Calcium: 8.9 mg/dL (ref 8.7–10.3)
Chloride: 106 mmol/L (ref 96–106)
Creatinine, Ser: 1.43 mg/dL — ABNORMAL HIGH (ref 0.57–1.00)
Glucose: 113 mg/dL — ABNORMAL HIGH (ref 65–99)
Potassium: 4.4 mmol/L (ref 3.5–5.2)
Sodium: 140 mmol/L (ref 134–144)
eGFR: 38 mL/min/{1.73_m2} — ABNORMAL LOW (ref >59)

## 2021-04-26 ENCOUNTER — Telehealth: Payer: Self-pay

## 2021-04-26 DIAGNOSIS — I10 Essential (primary) hypertension: Secondary | ICD-10-CM

## 2021-04-26 MED ORDER — FUROSEMIDE 20 MG PO TABS
20.0000 mg | ORAL_TABLET | ORAL | 3 refills | Status: DC
Start: 2021-04-26 — End: 2022-05-13

## 2021-04-26 NOTE — Telephone Encounter (Signed)
Patient aware of results and changes. Patient will come in on 7/11 for BMET.

## 2021-04-26 NOTE — Telephone Encounter (Signed)
-----   Message from Loel Dubonnet, NP sent at 04/26/2021  7:34 AM EDT ----- Normal electrolytes. Kidneys continue to show slight decline from baseline. Please ensure staying well hydrated. Recommend reducing Lasix to 20mg  every other day with repeat BMP in 2 weeks for monitoring.

## 2021-04-28 ENCOUNTER — Telehealth: Payer: Self-pay

## 2021-04-28 ENCOUNTER — Encounter (HOSPITAL_BASED_OUTPATIENT_CLINIC_OR_DEPARTMENT_OTHER): Payer: Medicare PPO | Admitting: Cardiology

## 2021-04-28 DIAGNOSIS — I129 Hypertensive chronic kidney disease with stage 1 through stage 4 chronic kidney disease, or unspecified chronic kidney disease: Secondary | ICD-10-CM | POA: Diagnosis not present

## 2021-04-28 DIAGNOSIS — R7301 Impaired fasting glucose: Secondary | ICD-10-CM | POA: Diagnosis not present

## 2021-04-28 DIAGNOSIS — I2510 Atherosclerotic heart disease of native coronary artery without angina pectoris: Secondary | ICD-10-CM | POA: Diagnosis not present

## 2021-04-28 DIAGNOSIS — N1832 Chronic kidney disease, stage 3b: Secondary | ICD-10-CM | POA: Diagnosis not present

## 2021-04-28 DIAGNOSIS — E785 Hyperlipidemia, unspecified: Secondary | ICD-10-CM | POA: Diagnosis not present

## 2021-04-28 NOTE — Telephone Encounter (Signed)
Called patient and informed her that she is scheduled for a sleep study on 07/01/21, patient verbalized understanding and thanked me for calling. I gave her the number to the sleep center [phone] if she has any further questions.

## 2021-05-10 ENCOUNTER — Other Ambulatory Visit: Payer: Medicare PPO

## 2021-05-10 ENCOUNTER — Other Ambulatory Visit: Payer: Self-pay

## 2021-05-10 DIAGNOSIS — I10 Essential (primary) hypertension: Secondary | ICD-10-CM | POA: Diagnosis not present

## 2021-05-11 ENCOUNTER — Telehealth: Payer: Self-pay | Admitting: Family

## 2021-05-11 LAB — BASIC METABOLIC PANEL
BUN/Creatinine Ratio: 15 (ref 12–28)
BUN: 21 mg/dL (ref 8–27)
CO2: 17 mmol/L — ABNORMAL LOW (ref 20–29)
Calcium: 9.2 mg/dL (ref 8.7–10.3)
Chloride: 106 mmol/L (ref 96–106)
Creatinine, Ser: 1.38 mg/dL — ABNORMAL HIGH (ref 0.57–1.00)
Glucose: 83 mg/dL (ref 65–99)
Potassium: 4.9 mmol/L (ref 3.5–5.2)
Sodium: 142 mmol/L (ref 134–144)
eGFR: 39 mL/min/{1.73_m2} — ABNORMAL LOW (ref >59)

## 2021-05-11 NOTE — Telephone Encounter (Signed)
Follow Up:     Pt is returning a call from this morning, concerning her lab results.

## 2021-05-11 NOTE — Telephone Encounter (Signed)
Pt advised her lab results.

## 2021-05-20 ENCOUNTER — Telehealth (HOSPITAL_COMMUNITY): Payer: Self-pay

## 2021-05-20 ENCOUNTER — Encounter (HOSPITAL_COMMUNITY): Payer: Self-pay

## 2021-05-20 NOTE — Telephone Encounter (Signed)
Attempted to call patient in regards to Cardiac Rehab - LM on VM Mailed letter 

## 2021-05-20 NOTE — Telephone Encounter (Signed)
Called patient to see if she was interested in participating in the Cardiac Rehab Program. Patient stated yes. Patient will come in for orientation on 06/22/2021@8 :30am and will attend the 8:45am exercise class.   Tourist information centre manager.

## 2021-06-04 DIAGNOSIS — M3501 Sicca syndrome with keratoconjunctivitis: Secondary | ICD-10-CM | POA: Diagnosis not present

## 2021-06-22 ENCOUNTER — Inpatient Hospital Stay (HOSPITAL_COMMUNITY): Admission: RE | Admit: 2021-06-22 | Payer: Medicare PPO | Source: Ambulatory Visit

## 2021-06-28 ENCOUNTER — Ambulatory Visit (HOSPITAL_COMMUNITY): Payer: Medicare PPO

## 2021-06-30 ENCOUNTER — Ambulatory Visit (HOSPITAL_COMMUNITY): Payer: Medicare PPO

## 2021-07-01 ENCOUNTER — Encounter (HOSPITAL_BASED_OUTPATIENT_CLINIC_OR_DEPARTMENT_OTHER): Payer: Medicare PPO | Admitting: Cardiology

## 2021-07-02 ENCOUNTER — Ambulatory Visit (HOSPITAL_COMMUNITY): Payer: Medicare PPO

## 2021-07-07 ENCOUNTER — Ambulatory Visit (HOSPITAL_COMMUNITY): Payer: Medicare PPO

## 2021-07-09 ENCOUNTER — Ambulatory Visit (HOSPITAL_COMMUNITY): Payer: Medicare PPO

## 2021-07-12 ENCOUNTER — Ambulatory Visit (HOSPITAL_COMMUNITY): Payer: Medicare PPO

## 2021-07-14 ENCOUNTER — Ambulatory Visit (HOSPITAL_COMMUNITY): Payer: Medicare PPO

## 2021-07-14 ENCOUNTER — Telehealth (HOSPITAL_COMMUNITY): Payer: Self-pay

## 2021-07-14 NOTE — Telephone Encounter (Signed)
Called and spoke with pt in regards to CR, pt stated she is not ready to reschedule at this time. Adv pt to contact CR when she is ready to schedule.   Closed referral

## 2021-07-16 ENCOUNTER — Ambulatory Visit (HOSPITAL_COMMUNITY): Payer: Medicare PPO

## 2021-07-19 ENCOUNTER — Ambulatory Visit (HOSPITAL_COMMUNITY): Payer: Medicare PPO

## 2021-07-21 ENCOUNTER — Ambulatory Visit (HOSPITAL_COMMUNITY): Payer: Medicare PPO

## 2021-07-23 ENCOUNTER — Ambulatory Visit (HOSPITAL_COMMUNITY): Payer: Medicare PPO

## 2021-07-26 ENCOUNTER — Ambulatory Visit (HOSPITAL_COMMUNITY): Payer: Medicare PPO

## 2021-07-28 ENCOUNTER — Ambulatory Visit (HOSPITAL_COMMUNITY): Payer: Medicare PPO

## 2021-07-30 ENCOUNTER — Ambulatory Visit (HOSPITAL_COMMUNITY): Payer: Medicare PPO

## 2021-08-02 ENCOUNTER — Ambulatory Visit (HOSPITAL_COMMUNITY): Payer: Medicare PPO

## 2021-08-04 ENCOUNTER — Ambulatory Visit (HOSPITAL_COMMUNITY): Payer: Medicare PPO

## 2021-08-04 ENCOUNTER — Telehealth: Payer: Self-pay | Admitting: Cardiovascular Disease

## 2021-08-04 NOTE — Telephone Encounter (Signed)
Patient called to say that she has been having nose bleeds this week in the morining! She would like to speak to a nurse in regards to this. Please advise

## 2021-08-04 NOTE — Telephone Encounter (Signed)
Spoke with pt who reports 2 nose bleeds since 08/01/2021.  Pt states both nose bleeds were easily managed with pressure.  She is not taking any additional OTC NSAIDS or ASA.  She does have a history of nosebleeds a few years ago when taking Brillinta that required nasal packing.  She denies blood in her urine or stools.  She also has a history of seasonal allergies and uses Flonase nasal spray.  Pt is scheduled to see Dr Burt Knack on 08/06/21 for routine follow up. Pt advised will forward to Dr Burt Knack for further recommendation.  Pt advised to seek treatment if nose bleeds heavily or bleeding lasts longer than 30 minutes and is not stopped with pressure.  Pt verbalizes understanding and agrees with current plan.

## 2021-08-05 NOTE — Telephone Encounter (Signed)
Thx will discuss at her office visit

## 2021-08-06 ENCOUNTER — Encounter: Payer: Self-pay | Admitting: Cardiovascular Disease

## 2021-08-06 ENCOUNTER — Ambulatory Visit: Payer: Medicare PPO | Admitting: Cardiovascular Disease

## 2021-08-06 ENCOUNTER — Other Ambulatory Visit: Payer: Self-pay

## 2021-08-06 VITALS — BP 130/74 | HR 60 | Ht 65.0 in | Wt 220.0 lb

## 2021-08-06 DIAGNOSIS — I10 Essential (primary) hypertension: Secondary | ICD-10-CM | POA: Diagnosis not present

## 2021-08-06 DIAGNOSIS — I2510 Atherosclerotic heart disease of native coronary artery without angina pectoris: Secondary | ICD-10-CM

## 2021-08-06 DIAGNOSIS — N1831 Chronic kidney disease, stage 3a: Secondary | ICD-10-CM | POA: Diagnosis not present

## 2021-08-06 DIAGNOSIS — I5032 Chronic diastolic (congestive) heart failure: Secondary | ICD-10-CM

## 2021-08-06 NOTE — Patient Instructions (Signed)
Medication Instructions:  1) On October 20th you can discontinue your Effient  *If you need a refill on your cardiac medications before your next appointment, please call your pharmacy*   Lab Work: None If you have labs (blood work) drawn today and your tests are completely normal, you will receive your results only by: [place] (if you have MyChart) OR A paper copy in the mail If you have any lab test that is abnormal or we need to change your treatment, we will call you to review the results.   Testing/Procedures: None   Follow-Up: At [facility], you and your health needs are our priority.  As part of our continuing mission to provide you with exceptional heart care, we have created designated Provider Care Teams.  These Care Teams include your primary Cardiologist (physician) and Advanced Practice Providers (APPs -  Physician Assistants and Nurse Practitioners) who all work together to provide you with the care you need, when you need it.  We recommend signing up for the patient portal called "MyChart".  Sign up information is provided on this After Visit Summary.  MyChart is used to connect with patients for Virtual Visits (Telemedicine).  Patients are able to view lab/test results, encounter notes, upcoming appointments, etc.  Non-urgent messages can be sent to your provider as well.   To learn more about what you can do with MyChart, go to [email or web address].    Your next appointment:   6 month(s)  The format for your next appointment:   In Person  Provider:   You will see one of the following Advanced Practice Providers on your designated Care Team:   Richardson Dopp, PA-C Robbie Lis, PA-C  Then, Sherren Mocha, MD will plan to see you again in 1 year(s).   Other Instructions

## 2021-08-06 NOTE — Progress Notes (Signed)
Cardiology Office Note:    Date:  08/06/2021   ID:  Donna Williamson 1946/04/06, MRN [id number]  PCP:  Donna Infante, MD   [facility] Providers Cardiologist:  Sherren Mocha, MD     Referring MD: Donna Infante, MD   Chief Complaint  Patient presents with   Coronary Artery Disease    History of Present Illness:    Donna Williamson is a 75 y.o. female with a hx of coronary artery disease, presenting for follow-up evaluation.  She underwent drug-eluting stent implantation in the LAD in April 2022 after presenting with chest pain.  She had negative cardiac biomarkers at the time.  Her procedure was uncomplicated.  Comorbid conditions include hypertension, mixed hyperlipidemia, stage IIIa chronic kidney disease, and chronic diastolic heart failure.  After the patient's PCI procedure in April, she developed a drug rash felt to be secondary to clopidogrel.  Her rash improved after clopidogrel was discontinued, confirming the cause-and-effect.  She has been treated with Effient 5 mg daily since that time.  The patient is here with her husband today.  She continues to have exertional dyspnea.  Denies orthopnea, PND, or any change in her chronic leg swelling.  She has had no recent chest pain or pressure.  She denies heart palpitations.  She has decided to delay cardiac rehab because of cost considerations.  She has had a lot of problems with bruising on Effient.  She is eager to discontinue this drug when it is safe.  Past Medical History:  Diagnosis Date   Anemia    Anginal pain ([place])    Anxiety    Arthritis    "feet, knees, back" (05/02/2016)   Asthma    CAD (coronary artery disease)    a. 04/2016: NSTEMI 95% stenosis 1st Mrg (s/p DES)   Chest pain    a. 10/2013 Exercise Myoview: Ef 65%, no ischemia.   CHF (congestive heart failure) (HCC)    Chronic bronchitis (HCC)    Chronic lower back pain    COPD (chronic obstructive pulmonary disease) (HCC)    GERD (gastroesophageal reflux  disease)    History of blood transfusion 1960s   "related to my hysterectomy"   Hypercholesterolemia    Hypertension    Lower extremity edema    Mitral valve prolapse    NSTEMI (non-ST elevated myocardial infarction) ([place]) 05/01/2016   Pneumonia 05/01/2016   "saw trace of slight pneumonia/CT scan" (05/02/2016)   Seasonal allergies    Sjogren's syndrome ([place])    Wheezing     Past Surgical History:  Procedure Laterality Date   ABDOMINAL HYSTERECTOMY  1960s   "partial"   CARDIAC CATHETERIZATION N/A 05/02/2016   Procedure: Right/Left Heart Cath and Coronary Angiography;  Surgeon: Donna Booze, MD;  Location: [place] [facility];  Service: Cardiovascular;  Laterality: N/A;   CARDIAC CATHETERIZATION N/A 05/02/2016   Procedure: Coronary Stent Intervention;  Surgeon: Donna Booze, MD;  Location: [place] [facility];  Service: Cardiovascular;  Laterality: N/A;   CATARACT EXTRACTION W/PHACO Right 09/20/2019   Procedure: CATARACT EXTRACTION PHACO AND INTRAOCULAR LENS PLACEMENT ([place]) RIGHT;  Surgeon: Donna Robson, MD;  Location: [facility];  Service: Ophthalmology;  Laterality: Right;  [place] 01:29.4 CDE 19.28 FLUID PACK LOT # [id number]   CATARACT EXTRACTION W/PHACO Left 10/15/2019   Procedure: CATARACT EXTRACTION PHACO AND INTRAOCULAR LENS PLACEMENT ([facility]) LEFT 8.76, 00:48.8;  Surgeon: Donna Robson, MD;  Location: [place];  Service: Ophthalmology;  Laterality: Left;   COLONOSCOPY W/  BIOPSIES AND POLYPECTOMY     CORONARY ANGIOPLASTY WITH STENT PLACEMENT  05/02/2016   "1 stent"   CORONARY STENT INTERVENTION N/A 02/17/2021   Procedure: CORONARY STENT INTERVENTION;  Surgeon: Donna Blanks, MD;  Location: [place] [facility];  Service: Cardiovascular;  Laterality: N/A;   ESOPHAGOGASTRODUODENOSCOPY     INTRAVASCULAR PRESSURE WIRE/FFR STUDY N/A 02/17/2021   Procedure: INTRAVASCULAR PRESSURE WIRE/FFR STUDY;  Surgeon: Donna Blanks, MD;  Location: [place] [facility];   Service: Cardiovascular;  Laterality: N/A;   JOINT REPLACEMENT Left 2007   hip   LAPAROSCOPIC SALPINGOOPHERECTOMY Bilateral ~ 1990   LEFT HEART CATH AND CORONARY ANGIOGRAPHY N/A 02/17/2021   Procedure: LEFT HEART CATH AND CORONARY ANGIOGRAPHY;  Surgeon: Donna Blanks, MD;  Location: [place] [facility];  Service: Cardiovascular;  Laterality: N/A;   TOTAL HIP ARTHROPLASTY Left 2007    Current Medications: Current Meds  Medication Sig   albuterol (PROVENTIL) (2.5 MG/3ML) 0.083% nebulizer solution Take 3 mLs (2.5 mg total) by nebulization every 2 (two) hours as needed for wheezing or shortness of breath.   ALPRAZolam (XANAX) 0.5 MG tablet Take 0.5 mg by mouth daily as needed for anxiety.    aspirin EC 81 MG tablet Take 81 mg by mouth daily.   budesonide-formoterol (SYMBICORT) 160-4.5 MCG/ACT inhaler Inhale 2 puffs into the lungs 2 (two) times daily.   Cholecalciferol (VITAMIN D3) 2000 UNITS TABS Take 2,000 Units by mouth daily.   cyanocobalamin 500 MCG tablet Take 1,000 mcg by mouth daily.   diltiazem (CARDIZEM CD) 240 MG 24 hr capsule Take 1 capsule (240 mg total) by mouth daily.   EPIPEN 2-PAK 0.3 MG/0.3ML SOAJ injection Inject 0.3 mg into the skin daily as needed (allergic reaction).    FLUoxetine (PROZAC) 10 MG tablet Take 10 mg by mouth daily.   fluticasone (FLONASE) 50 MCG/ACT nasal spray Place 2 sprays into the nose daily.   fluticasone-salmeterol (ADVAIR) 250-50 MCG/ACT AEPB 1 puff   furosemide (LASIX) 20 MG tablet Take 1 tablet (20 mg total) by mouth every other day.   gabapentin (NEURONTIN) 100 MG capsule Take 100 mg by mouth at bedtime.    metoprolol tartrate (LOPRESSOR) 25 MG tablet Take 0.5 tablets (12.5 mg total) by mouth 2 (two) times daily.   montelukast (SINGULAIR) 10 MG tablet Take 10 mg by mouth at bedtime.   Multiple Vitamin (MULTIVITAMIN WITH MINERALS) TABS tablet Take 1 tablet by mouth daily.   nitroGLYCERIN (NITROSTAT) 0.4 MG SL tablet Place 1 tablet (0.4 mg  total) under the tongue every 5 (five) minutes as needed for chest pain (up to 3 doses. If taking 3rd dose call 911).   Olopatadine HCl 0.2 % SOLN Place 1 drop into both eyes daily as needed (allergies).   pantoprazole (PROTONIX) 40 MG tablet Take 40 mg by mouth daily.   Polyvinyl Alcohol (LIQUID TEARS OP) Place 1 drop into both eyes daily as needed (dry eyes).   potassium chloride (KLOR-CON) 10 MEQ tablet Take 10 mEq by mouth 2 (two) times daily.   rosuvastatin (CRESTOR) 20 MG tablet Take 1 tablet (20 mg total) by mouth daily at 6 PM.   [DISCONTINUED] prasugrel (EFFIENT) 5 MG TABS tablet Take 1 tablet (5 mg total) by mouth daily.     Allergies:   Levaquin [levofloxacin], Plavix [clopidogrel bisulfate], Codeine, Drug ingredient [ticagrelor], Nitrofurantoin, Ciprofloxacin, Penicillins, and Sulfa antibiotics   Social History   Socioeconomic History   Marital status: Married    Spouse name: Not on  file   Number of children: Not on file   Years of education: Not on file   Highest education level: Not on file  Occupational History   Not on file  Tobacco Use   Smoking status: Never   Smokeless tobacco: Never  Vaping Use   Vaping Use: Never used  Substance and Sexual Activity   Alcohol use: No    Alcohol/week: 0.0 standard drinks   Drug use: No   Sexual activity: Yes  Other Topics Concern   Not on file  Social History Narrative   Lives in [place] with husband.  Active but does not routinely exercise.   Social Determinants of Health   Financial Resource Strain: Not on file  Food Insecurity: Not on file  Transportation Needs: Not on file  Physical Activity: Not on file  Stress: Not on file  Social Connections: Not on file     Family History: The patient's family history includes Heart disease in her father; Hypertension in her mother; Ovarian cancer in her sister; Stroke in her mother.  ROS:   Please see the history of present illness.    All other systems reviewed and are  negative.  EKGs/Labs/Other Studies Reviewed:     EKG:  EKG is not ordered today.    Recent Labs: 02/16/2021: TSH 2.980 02/17/2021: ALT 16 02/18/2021: Hemoglobin 11.1; Magnesium 2.2; Platelets 258 04/05/2021: NT-Pro BNP 648 05/10/2021: BUN 21; Creatinine, Ser 1.38; Potassium 4.9; Sodium 142  Recent Lipid Panel    Component Value Date/Time   CHOL 131 02/17/2021 0154   TRIG 132 02/17/2021 0154   HDL 46 02/17/2021 0154   CHOLHDL 2.8 02/17/2021 0154   VLDL 26 02/17/2021 0154   LDLCALC 59 02/17/2021 0154     Risk Assessment/Calculations:           Physical Exam:    VS:  BP 130/74   Pulse 60   Ht 5\' 5"  (1.651 m)   Wt 220 lb (99.8 kg)   SpO2 94%   BMI 36.61 kg/m     Wt Readings from Last 3 Encounters:  08/06/21 220 lb (99.8 kg)  04/05/21 219 lb 12.8 oz (99.7 kg)  03/19/21 226 lb (102.5 kg)     GEN:  Well nourished, well developed in no acute distress HEENT: Normal NECK: No JVD; No carotid bruits LYMPHATICS: No lymphadenopathy CARDIAC: RRR, no murmurs, rubs, gallops RESPIRATORY:  Clear to auscultation without rales, wheezing or rhonchi  ABDOMEN: Soft, non-tender, non-distended MUSCULOSKELETAL: 1+ bilateral ankle edema; No deformity  SKIN: Warm and dry, ecchymoses on the arms noted NEUROLOGIC:  Alert and oriented x 3 PSYCHIATRIC:  Normal affect   ASSESSMENT:    1. Coronary artery disease involving native coronary artery of native heart without angina pectoris   2. Essential hypertension   3. Stage 3a chronic kidney disease ([place])   4. Chronic diastolic heart failure (HCC)    PLAN:    In order of problems listed above:  The patient is clinically stable.  She is having no anginal symptoms.  She is having extensive bruising with Effient.  I reviewed her coronary angiogram and hospital records from April of this year.  I think she can discontinue Effient on October 20 when she is 6 months out from her most recent PCI procedure.  She should continue on aspirin 81 mg  daily.  Her other medications will be continued without change. Blood pressure well controlled on current medical therapy.  Most recent labs reviewed. The patient's creatinine is  stable with most recent check on May 10, 2021 at 1.38. Volume appears stable.  Chronic ankle edema is probably multifactorial with use of calcium channel blocker, some dependent edema from venous insufficiency both likely contributing.  Continue current medications.        Medication Adjustments/Labs and Tests Ordered: Current medicines are reviewed at length with the patient today.  Concerns regarding medicines are outlined above.  No orders of the defined types were placed in this encounter.  No orders of the defined types were placed in this encounter.   Patient Instructions  Medication Instructions:  1) On October 20th you can discontinue your Effient  *If you need a refill on your cardiac medications before your next appointment, please call your pharmacy*   Lab Work: None If you have labs (blood work) drawn today and your tests are completely normal, you will receive your results only by: [place] (if you have MyChart) OR A paper copy in the mail If you have any lab test that is abnormal or we need to change your treatment, we will call you to review the results.   Testing/Procedures: None   Follow-Up: At [facility], you and your health needs are our priority.  As part of our continuing mission to provide you with exceptional heart care, we have created designated Provider Care Teams.  These Care Teams include your primary Cardiologist (physician) and Advanced Practice Providers (APPs -  Physician Assistants and Nurse Practitioners) who all work together to provide you with the care you need, when you need it.  We recommend signing up for the patient portal called "MyChart".  Sign up information is provided on this After Visit Summary.  MyChart is used to connect with patients for  Virtual Visits (Telemedicine).  Patients are able to view lab/test results, encounter notes, upcoming appointments, etc.  Non-urgent messages can be sent to your provider as well.   To learn more about what you can do with MyChart, go to [email or web address].    Your next appointment:   6 month(s)  The format for your next appointment:   In Person  Provider:   You will see one of the following Advanced Practice Providers on your designated Care Team:   Richardson Dopp, PA-C Robbie Lis, PA-C  Then, Sherren Mocha, MD will plan to see you again in 1 year(s).   Other Instructions     Signed, Sherren Mocha, MD  08/06/2021 5:37 PM    [place]

## 2021-08-06 NOTE — Progress Notes (Signed)
Cardiology Office Note:    Date:  08/06/2021   ID:  Donna Williamson, Donna Williamson 05/14/1946, MRN [id number]  PCP:  Crist Infante, MD   [facility] Providers Cardiologist:  Sherren Mocha, MD     Referring MD: Crist Infante, MD   Chief Complaint  Patient presents with   Coronary Artery Disease    History of Present Illness:    Donna Williamson is a 75 y.o. female with a hx of: Coronary artery disease  NSTEMI 04/2016 s/p DES to OM1 Myoview 3/21: low risk S/p DES to LAD and POBA of D2 in 01/2021 (HFpEF) heart failure with preserved ejection fraction  Hypertension  Hyperlipidemia  Chronic kidney disease  COPD GERD Sjogren's syndrome   The patient is here with her husband today.  She complains of easy bruising on Effient.  She has a lot of bruising on her arms from very minor bumps.  She denies any serious bleeding or gastrointestinal bleeding.  She has not required blood transfusion.  She has chronic exertional dyspnea with no recent change in symptoms.  She denies chest pain or chest pressure.  No orthopnea or PND.  Past Medical History:  Diagnosis Date   Anemia    Anginal pain ([place])    Anxiety    Arthritis    "feet, knees, back" (05/02/2016)   Asthma    CAD (coronary artery disease)    a. 04/2016: NSTEMI 95% stenosis 1st Mrg (s/p DES)   Chest pain    a. 10/2013 Exercise Myoview: Ef 65%, no ischemia.   CHF (congestive heart failure) (HCC)    Chronic bronchitis (HCC)    Chronic lower back pain    COPD (chronic obstructive pulmonary disease) (HCC)    GERD (gastroesophageal reflux disease)    History of blood transfusion 1960s   "related to my hysterectomy"   Hypercholesterolemia    Hypertension    Lower extremity edema    Mitral valve prolapse    NSTEMI (non-ST elevated myocardial infarction) ([place]) 05/01/2016   Pneumonia 05/01/2016   "saw trace of slight pneumonia/CT scan" (05/02/2016)   Seasonal allergies    Sjogren's syndrome ([place])    Wheezing     Past Surgical History:   Procedure Laterality Date   ABDOMINAL HYSTERECTOMY  1960s   "partial"   CARDIAC CATHETERIZATION N/A 05/02/2016   Procedure: Right/Left Heart Cath and Coronary Angiography;  Surgeon: Jettie Booze, MD;  Location: [facility];  Service: Cardiovascular;  Laterality: N/A;   CARDIAC CATHETERIZATION N/A 05/02/2016   Procedure: Coronary Stent Intervention;  Surgeon: Jettie Booze, MD;  Location: [facility];  Service: Cardiovascular;  Laterality: N/A;   CATARACT EXTRACTION W/PHACO Right 09/20/2019   Procedure: CATARACT EXTRACTION PHACO AND INTRAOCULAR LENS PLACEMENT ([place]) RIGHT;  Surgeon: Birder Robson, MD;  Location: [facility];  Service: Ophthalmology;  Laterality: Right;  [place] 01:29.4 CDE 19.28 FLUID PACK LOT # [id number]   CATARACT EXTRACTION W/PHACO Left 10/15/2019   Procedure: CATARACT EXTRACTION PHACO AND INTRAOCULAR LENS PLACEMENT (I[facility]) LEFT 8.76, 00:48.8;  Surgeon: Birder Robson, MD;  Location: [facility];  Service: Ophthalmology;  Laterality: Left;   COLONOSCOPY W/ BIOPSIES AND POLYPECTOMY     CORONARY ANGIOPLASTY WITH STENT PLACEMENT  05/02/2016   "1 stent"   CORONARY STENT INTERVENTION N/A 02/17/2021   Procedure: CORONARY STENT INTERVENTION;  Surgeon: Burnell Blanks, MD;  Location: [facility];  Service: Cardiovascular;  Laterality: N/A;   ESOPHAGOGASTRODUODENOSCOPY     INTRAVASCULAR PRESSURE WIRE/FFR STUDY  N/A 02/17/2021   Procedure: INTRAVASCULAR PRESSURE WIRE/FFR STUDY;  Surgeon: Burnell Blanks, MD;  Location: [place];  Service: Cardiovascular;  Laterality: N/A;   JOINT REPLACEMENT Left 2007   hip   LAPAROSCOPIC SALPINGOOPHERECTOMY Bilateral ~ 1990   LEFT HEART CATH AND CORONARY ANGIOGRAPHY N/A 02/17/2021   Procedure: LEFT HEART CATH AND CORONARY ANGIOGRAPHY;  Surgeon: Burnell Blanks, MD;  Location: [facility];  Service: Cardiovascular;  Laterality: N/A;   TOTAL HIP ARTHROPLASTY Left 2007     Current Medications: Current Meds  Medication Sig   albuterol (PROVENTIL) (2.5 MG/3ML) 0.083% nebulizer solution Take 3 mLs (2.5 mg total) by nebulization every 2 (two) hours as needed for wheezing or shortness of breath.   ALPRAZolam (XANAX) 0.5 MG tablet Take 0.5 mg by mouth daily as needed for anxiety.    aspirin EC 81 MG tablet Take 81 mg by mouth daily.   budesonide-formoterol (SYMBICORT) 160-4.5 MCG/ACT inhaler Inhale 2 puffs into the lungs 2 (two) times daily.   Cholecalciferol (VITAMIN D3) 2000 UNITS TABS Take 2,000 Units by mouth daily.   cyanocobalamin 500 MCG tablet Take 1,000 mcg by mouth daily.   diltiazem (CARDIZEM CD) 240 MG 24 hr capsule Take 1 capsule (240 mg total) by mouth daily.   EPIPEN 2-PAK 0.3 MG/0.3ML SOAJ injection Inject 0.3 mg into the skin daily as needed (allergic reaction).    FLUoxetine (PROZAC) 10 MG tablet Take 10 mg by mouth daily.   fluticasone (FLONASE) 50 MCG/ACT nasal spray Place 2 sprays into the nose daily.   fluticasone-salmeterol (ADVAIR) 250-50 MCG/ACT AEPB 1 puff   furosemide (LASIX) 20 MG tablet Take 1 tablet (20 mg total) by mouth every other day.   gabapentin (NEURONTIN) 100 MG capsule Take 100 mg by mouth at bedtime.    metoprolol tartrate (LOPRESSOR) 25 MG tablet Take 0.5 tablets (12.5 mg total) by mouth 2 (two) times daily.   montelukast (SINGULAIR) 10 MG tablet Take 10 mg by mouth at bedtime.   Multiple Vitamin (MULTIVITAMIN WITH MINERALS) TABS tablet Take 1 tablet by mouth daily.   nitroGLYCERIN (NITROSTAT) 0.4 MG SL tablet Place 1 tablet (0.4 mg total) under the tongue every 5 (five) minutes as needed for chest pain (up to 3 doses. If taking 3rd dose call 911).   Olopatadine HCl 0.2 % SOLN Place 1 drop into both eyes daily as needed (allergies).   pantoprazole (PROTONIX) 40 MG tablet Take 40 mg by mouth daily.   Polyvinyl Alcohol (LIQUID TEARS OP) Place 1 drop into both eyes daily as needed (dry eyes).   potassium chloride (KLOR-CON)  10 MEQ tablet Take 10 mEq by mouth 2 (two) times daily.   rosuvastatin (CRESTOR) 20 MG tablet Take 1 tablet (20 mg total) by mouth daily at 6 PM.   [DISCONTINUED] prasugrel (EFFIENT) 5 MG TABS tablet Take 1 tablet (5 mg total) by mouth daily.     Allergies:   Levaquin [levofloxacin], Plavix [clopidogrel bisulfate], Codeine, Drug ingredient [ticagrelor], Nitrofurantoin, Ciprofloxacin, Penicillins, and Sulfa antibiotics   Social History   Socioeconomic History   Marital status: Married    Spouse name: Not on file   Number of children: Not on file   Years of education: Not on file   Highest education level: Not on file  Occupational History   Not on file  Tobacco Use   Smoking status: Never   Smokeless tobacco: Never  Vaping Use   Vaping Use: Never used  Substance and Sexual Activity  Alcohol use: No    Alcohol/week: 0.0 standard drinks   Drug use: No   Sexual activity: Yes  Other Topics Concern   Not on file  Social History Narrative   Lives in [place] with husband.  Active but does not routinely exercise.   Social Determinants of Health   Financial Resource Strain: Not on file  Food Insecurity: Not on file  Transportation Needs: Not on file  Physical Activity: Not on file  Stress: Not on file  Social Connections: Not on file     Family History: The patient's family history includes Heart disease in her father; Hypertension in her mother; Ovarian cancer in her sister; Stroke in her mother.  ROS:   Please see the history of present illness.    All other systems reviewed and are negative.  EKGs/Labs/Other Studies Reviewed:    The following studies were reviewed today: Echo 02/18/21: IMPRESSIONS     1. Normal GLS -17.4. Left ventricular ejection fraction, by estimation,  is 60 to 65%. The left ventricle has normal function. The left ventricle  has no regional wall motion abnormalities. Left ventricular diastolic  parameters were normal.   2. Right ventricular  systolic function is normal. The right ventricular  size is normal.   3. Left atrial size was mildly dilated.   4. The mitral valve is abnormal. Trivial mitral valve regurgitation. No  evidence of mitral stenosis.   5. Severe nodular calcification of the left coronary cusp. The aortic  valve is calcified. There is severe calcifcation of the aortic valve.  There is moderate thickening of the aortic valve. Aortic valve  regurgitation is not visualized. No aortic  stenosis is present.   6. The inferior vena cava is normal in size with greater than 50%  respiratory variability, suggesting right atrial pressure of 3 mmHg.  Cardiac Cath 02/17/2021: Colon Flattery 2nd Diag to 2nd Diag lesion is 70% stenosed. Previously placed 1st Mrg stent (unknown type) is widely patent. Prox Cx to Mid Cx lesion is 40% stenosed. Prox LAD to Mid LAD lesion is 40% stenosed. 2nd Diag lesion is 80% stenosed. Mid LAD lesion is 80% stenosed. Post intervention, there is a 80% residual stenosis. A drug-eluting stent was successfully placed using a SYNERGY XD 2.50X16. Post intervention, there is a 0% residual stenosis.   1. Moderate, calcified proximal LAD stenosis.  2. Severe mid LAD stenosis at the takeoff of the moderate caliber second Diagonal branch. RFR of the mid LAD lesion was 0.84 suggesting the lesion was flow limiting. The ostium of the moderate caliber second Diagonal branch had a severe stenosis.  3. Patent obtuse marginal stent 4. Non-dominant RCA with mild plaque 5. Successful balloon angioplasty of the ostium of the Diagonal branch. This was done prior to stenting of the LAD. TIMI-3 flow down the Diagonal branch post LAD stenting.  6. Successful PTCA/DES x 1 mid LAD.  7. Preserved LV systolic function.    Recommendations: Will continue DAPT with ASA and Plavix for at least six months. Continue statin. I would expect that she will be watched overnight and discharged home tomorrow.    EKG:  EKG is not ordered  today.   Recent Labs: 02/16/2021: TSH 2.980 02/17/2021: ALT 16 02/18/2021: Hemoglobin 11.1; Magnesium 2.2; Platelets 258 04/05/2021: NT-Pro BNP 648 05/10/2021: BUN 21; Creatinine, Ser 1.38; Potassium 4.9; Sodium 142  Recent Lipid Panel    Component Value Date/Time   CHOL 131 02/17/2021 0154   TRIG 132 02/17/2021 0154   HDL 46  02/17/2021 0154   CHOLHDL 2.8 02/17/2021 0154   VLDL 26 02/17/2021 0154   LDLCALC 59 02/17/2021 0154     Risk Assessment/Calculations:      Physical Exam:    VS:  BP 130/74   Pulse 60   Ht 5\' 5"  (1.651 m)   Wt 220 lb (99.8 kg)   SpO2 94%   BMI 36.61 kg/m     Wt Readings from Last 3 Encounters:  08/06/21 220 lb (99.8 kg)  04/05/21 219 lb 12.8 oz (99.7 kg)  03/19/21 226 lb (102.5 kg)     GEN:  Well nourished, well developed in no acute distress HEENT: Normal NECK: No JVD; No carotid bruits LYMPHATICS: No lymphadenopathy CARDIAC: RRR, no murmurs, rubs, gallops RESPIRATORY:  Clear to auscultation without rales, wheezing or rhonchi  ABDOMEN: Soft, non-tender, non-distended MUSCULOSKELETAL: 1+ bilateral ankle edema; No deformity  SKIN: Warm and dry, ecchymoses noted on the arms NEUROLOGIC:  Alert and oriented x 3 PSYCHIATRIC:  Normal affect   ASSESSMENT:    1. Coronary artery disease involving native coronary artery of native heart without angina pectoris   2. Essential hypertension   3. Stage 3a chronic kidney disease ([place])   4. Chronic diastolic heart failure (HCC)    PLAN:    In order of problems listed above:  The patient is stable with no symptoms of angina.  She is approaching 6 months out from PCI.  I reviewed hospital records as well as her coronary angiogram today.  She did not have elevated cardiac enzymes at the time and I think can be treated as a stable PCI patient.  This would mean she could discontinue prasugrel at 6 months.  She can stop this medication on October 20.  She should remain on low-dose aspirin indefinitely. Blood  pressure is well controlled on current medical therapy.  This includes diltiazem, isosorbide, and furosemide. Stable, recent labs reviewed with a creatinine of 1.38. Appears euvolemic on exam.  Suspect chronic leg edema related to calcium channel blocker use and possibly venous insufficiency.  Would continue current medicines.   Medication Adjustments/Labs and Tests Ordered: Current medicines are reviewed at length with the patient today.  Concerns regarding medicines are outlined above.  No orders of the defined types were placed in this encounter.  No orders of the defined types were placed in this encounter.   Patient Instructions  Medication Instructions:  1) On October 20th you can discontinue your Effient  *If you need a refill on your cardiac medications before your next appointment, please call your pharmacy*   Lab Work: None If you have labs (blood work) drawn today and your tests are completely normal, you will receive your results only by: [place] (if you have MyChart) OR A paper copy in the mail If you have any lab test that is abnormal or we need to change your treatment, we will call you to review the results.   Testing/Procedures: None   Follow-Up: At [facility], you and your health needs are our priority.  As part of our continuing mission to provide you with exceptional heart care, we have created designated Provider Care Teams.  These Care Teams include your primary Cardiologist (physician) and Advanced Practice Providers (APPs -  Physician Assistants and Nurse Practitioners) who all work together to provide you with the care you need, when you need it.  We recommend signing up for the patient portal called "MyChart".  Sign up information is provided on this After Visit  Summary.  MyChart is used to connect with patients for Virtual Visits (Telemedicine).  Patients are able to view lab/test results, encounter notes, upcoming appointments, etc.   Non-urgent messages can be sent to your provider as well.   To learn more about what you can do with MyChart, go to [email or web address].    Your next appointment:   6 month(s)  The format for your next appointment:   In Person  Provider:   You will see one of the following Advanced Practice Providers on your designated Care Team:   Richardson Dopp, PA-C Robbie Lis, PA-C  Then, Sherren Mocha, MD will plan to see you again in 1 year(s).   Other Instructions     Signed, Sherren Mocha, MD  08/06/2021 5:30 PM    [facility]

## 2021-08-07 DIAGNOSIS — Z23 Encounter for immunization: Secondary | ICD-10-CM | POA: Diagnosis not present

## 2021-08-09 ENCOUNTER — Ambulatory Visit (HOSPITAL_COMMUNITY): Payer: Medicare PPO

## 2021-08-11 ENCOUNTER — Ambulatory Visit (HOSPITAL_COMMUNITY): Payer: Medicare PPO

## 2021-08-11 DIAGNOSIS — M1712 Unilateral primary osteoarthritis, left knee: Secondary | ICD-10-CM | POA: Diagnosis not present

## 2021-08-13 ENCOUNTER — Ambulatory Visit (HOSPITAL_COMMUNITY): Payer: Medicare PPO

## 2021-08-16 ENCOUNTER — Ambulatory Visit (HOSPITAL_COMMUNITY): Payer: Medicare PPO

## 2021-08-18 ENCOUNTER — Ambulatory Visit (HOSPITAL_COMMUNITY): Payer: Medicare PPO

## 2021-08-20 ENCOUNTER — Ambulatory Visit (HOSPITAL_COMMUNITY): Payer: Medicare PPO

## 2021-08-24 DIAGNOSIS — Z1231 Encounter for screening mammogram for malignant neoplasm of breast: Secondary | ICD-10-CM | POA: Diagnosis not present

## 2021-09-02 DIAGNOSIS — H26492 Other secondary cataract, left eye: Secondary | ICD-10-CM | POA: Diagnosis not present

## 2021-09-02 DIAGNOSIS — H43812 Vitreous degeneration, left eye: Secondary | ICD-10-CM | POA: Diagnosis not present

## 2021-10-10 ENCOUNTER — Other Ambulatory Visit: Payer: Self-pay | Admitting: Physician Assistant

## 2021-10-12 ENCOUNTER — Telehealth: Payer: Self-pay

## 2021-10-13 DIAGNOSIS — E785 Hyperlipidemia, unspecified: Secondary | ICD-10-CM | POA: Diagnosis not present

## 2021-10-13 DIAGNOSIS — I10 Essential (primary) hypertension: Secondary | ICD-10-CM | POA: Diagnosis not present

## 2021-10-13 DIAGNOSIS — R7301 Impaired fasting glucose: Secondary | ICD-10-CM | POA: Diagnosis not present

## 2021-10-14 DIAGNOSIS — Z6836 Body mass index (BMI) 36.0-36.9, adult: Secondary | ICD-10-CM | POA: Diagnosis not present

## 2021-10-14 DIAGNOSIS — Z1272 Encounter for screening for malignant neoplasm of vagina: Secondary | ICD-10-CM | POA: Diagnosis not present

## 2021-10-14 DIAGNOSIS — Z124 Encounter for screening for malignant neoplasm of cervix: Secondary | ICD-10-CM | POA: Diagnosis not present

## 2021-10-20 DIAGNOSIS — R82998 Other abnormal findings in urine: Secondary | ICD-10-CM | POA: Diagnosis not present

## 2021-10-20 DIAGNOSIS — R9431 Abnormal electrocardiogram [ECG] [EKG]: Secondary | ICD-10-CM | POA: Diagnosis not present

## 2021-10-20 DIAGNOSIS — N1832 Chronic kidney disease, stage 3b: Secondary | ICD-10-CM | POA: Diagnosis not present

## 2021-10-20 DIAGNOSIS — Z1331 Encounter for screening for depression: Secondary | ICD-10-CM | POA: Diagnosis not present

## 2021-10-20 DIAGNOSIS — E785 Hyperlipidemia, unspecified: Secondary | ICD-10-CM | POA: Diagnosis not present

## 2021-10-20 DIAGNOSIS — Z0000 Encounter for general adult medical examination without abnormal findings: Secondary | ICD-10-CM | POA: Diagnosis not present

## 2021-10-20 DIAGNOSIS — I2510 Atherosclerotic heart disease of native coronary artery without angina pectoris: Secondary | ICD-10-CM | POA: Diagnosis not present

## 2021-10-20 DIAGNOSIS — I129 Hypertensive chronic kidney disease with stage 1 through stage 4 chronic kidney disease, or unspecified chronic kidney disease: Secondary | ICD-10-CM | POA: Diagnosis not present

## 2021-10-20 DIAGNOSIS — H6122 Impacted cerumen, left ear: Secondary | ICD-10-CM | POA: Diagnosis not present

## 2021-10-20 DIAGNOSIS — I200 Unstable angina: Secondary | ICD-10-CM | POA: Diagnosis not present

## 2021-10-20 DIAGNOSIS — R7301 Impaired fasting glucose: Secondary | ICD-10-CM | POA: Diagnosis not present

## 2021-11-05 DIAGNOSIS — R82998 Other abnormal findings in urine: Secondary | ICD-10-CM | POA: Diagnosis not present

## 2021-11-05 DIAGNOSIS — I10 Essential (primary) hypertension: Secondary | ICD-10-CM | POA: Diagnosis not present

## 2021-11-11 DIAGNOSIS — E8940 Asymptomatic postprocedural ovarian failure: Secondary | ICD-10-CM | POA: Diagnosis not present

## 2021-11-12 DIAGNOSIS — M1712 Unilateral primary osteoarthritis, left knee: Secondary | ICD-10-CM | POA: Diagnosis not present

## 2021-12-11 ENCOUNTER — Emergency Department (HOSPITAL_BASED_OUTPATIENT_CLINIC_OR_DEPARTMENT_OTHER): Payer: Medicare PPO

## 2021-12-11 ENCOUNTER — Emergency Department (HOSPITAL_BASED_OUTPATIENT_CLINIC_OR_DEPARTMENT_OTHER): Payer: Medicare PPO | Admitting: Radiology

## 2021-12-11 ENCOUNTER — Emergency Department (HOSPITAL_BASED_OUTPATIENT_CLINIC_OR_DEPARTMENT_OTHER)
Admission: EM | Admit: 2021-12-11 | Discharge: 2021-12-11 | Disposition: A | Discharge status: Home / Self Care | Payer: Medicare PPO | Attending: Emergency Medicine | Admitting: Emergency Medicine

## 2021-12-11 ENCOUNTER — Encounter (HOSPITAL_BASED_OUTPATIENT_CLINIC_OR_DEPARTMENT_OTHER): Payer: Self-pay | Admitting: Emergency Medicine

## 2021-12-11 DIAGNOSIS — R079 Chest pain, unspecified: Secondary | ICD-10-CM | POA: Insufficient documentation

## 2021-12-11 DIAGNOSIS — M545 Low back pain, unspecified: Secondary | ICD-10-CM | POA: Diagnosis not present

## 2021-12-11 DIAGNOSIS — Z7982 Long term (current) use of aspirin: Secondary | ICD-10-CM | POA: Diagnosis not present

## 2021-12-11 DIAGNOSIS — R1031 Right lower quadrant pain: Secondary | ICD-10-CM | POA: Insufficient documentation

## 2021-12-11 DIAGNOSIS — M542 Cervicalgia: Secondary | ICD-10-CM | POA: Diagnosis not present

## 2021-12-11 DIAGNOSIS — M7918 Myalgia, other site: Secondary | ICD-10-CM

## 2021-12-11 DIAGNOSIS — M791 Myalgia, unspecified site: Secondary | ICD-10-CM | POA: Diagnosis not present

## 2021-12-11 DIAGNOSIS — M25512 Pain in left shoulder: Secondary | ICD-10-CM | POA: Diagnosis not present

## 2021-12-11 DIAGNOSIS — Y92410 Unspecified street and highway as the place of occurrence of the external cause: Secondary | ICD-10-CM | POA: Insufficient documentation

## 2021-12-11 NOTE — ED Notes (Signed)
Pt returned from radiology.
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
## 2021-12-11 NOTE — ED Notes (Signed)
Dc instructions reviewed with pt no questions or concerns a this time will follow up as needed.

## 2021-12-15 DIAGNOSIS — T50995D Adverse effect of other drugs, medicaments and biological substances, subsequent encounter: Secondary | ICD-10-CM | POA: Diagnosis not present

## 2021-12-15 DIAGNOSIS — H1045 Other chronic allergic conjunctivitis: Secondary | ICD-10-CM | POA: Diagnosis not present

## 2021-12-15 DIAGNOSIS — J4530 Mild persistent asthma, uncomplicated: Secondary | ICD-10-CM | POA: Diagnosis not present

## 2021-12-15 DIAGNOSIS — J3089 Other allergic rhinitis: Secondary | ICD-10-CM | POA: Diagnosis not present

## 2021-12-20 DIAGNOSIS — H0014 Chalazion left upper eyelid: Secondary | ICD-10-CM | POA: Diagnosis not present

## 2021-12-24 DIAGNOSIS — I129 Hypertensive chronic kidney disease with stage 1 through stage 4 chronic kidney disease, or unspecified chronic kidney disease: Secondary | ICD-10-CM | POA: Diagnosis not present

## 2021-12-24 DIAGNOSIS — H00014 Hordeolum externum left upper eyelid: Secondary | ICD-10-CM | POA: Diagnosis not present

## 2021-12-24 DIAGNOSIS — L03213 Periorbital cellulitis: Secondary | ICD-10-CM | POA: Diagnosis not present

## 2021-12-24 DIAGNOSIS — J309 Allergic rhinitis, unspecified: Secondary | ICD-10-CM | POA: Diagnosis not present

## 2021-12-30 DIAGNOSIS — H0014 Chalazion left upper eyelid: Secondary | ICD-10-CM | POA: Diagnosis not present

## 2022-02-03 NOTE — Progress Notes (Signed)
Has ?Cardiology Office Note:   ? ?Date:  02/04/2022  ? ?ID:  Donna Williamson, DOB [birth year], MRN [id number] ? ?PCP:  Crist Infante, MD  ?[facility] Providers ?Cardiologist:  Sherren Mocha, MD    ?Referring MD: Crist Infante, MD  ? ?Chief Complaint:  F/u for CAD ?  ? ?Patient Profile: ?Coronary artery disease  ?NSTEMI 04/2016 s/p DES to OM1 ?Myoview 3/21: low risk ?S/p DES to LAD and POBA of D2 in 01/2021 ?Effient Rx due to allergy to Brilinta and Plavix  ?Intol to Brilinta (epistaxis) ?Allergy to Plavix  ?(HFpEF) heart failure with preserved ejection fraction  ?Hypertension  ?Hyperlipidemia  ?Chronic kidney disease  ?COPD ?GERD ?Sjogren's syndrome  ?Sleep apnea  ?  ?Prior CV studies: ?LEFT HEART CATH 02/17/2021 ?LAD prox to mid 40, mid 80 (RFR 0.84); D2 ost 70, 80 ?LCx prox to mid 40; OM1 stent patent ?RCA irregs ?PCI: 2.5 x 16 mm Synergy DES to mLAD; POBA to D2 ?  ?Echocardiogram 02/18/21 ?GLS -17.4; EF 60-65, no RWMA, normal RVSF, mild LAE, trivial MR, severe nodular calcification of AV L coronary cusp, no AS ?  ?GATED SPECT MYO PERF W/LEXISCAN STRESS 2D 01/20/2020 ?EF 54, no ischemia; low risk ?  ?LONG TERM MONITOR (3-7 DAYS) INTERPRETATION 06/13/2019 ?Normal sinus rhythm, avg HR 67, short SVT (8-10 beats), PVCs < 1% burden ?  ?Carotid US 3/15 ?Normal bilateral carotid arteries ? ?History of Present Illness:   ?Donna Williamson is a 76 y.o. female with the above problem list.  She was last seen by Dr. Burt Knack in Oct 2022.  She was having a lot of bruising with Effient and it was decided to DC it after 6 mos of DAPT.  She returns for f/u.  She is here alone.  Overall, she is doing well.  She has chronic shortness of breath related to asthma/COPD.  When pollen counts are increased, she has more issues.  She has not had orthopnea.  Lower extremity edema is overall stable.  She only uses Lasix every few days.  She has not had chest discomfort, syncope. ?   ?Past Medical History:  ?Diagnosis Date  ? Anemia   ? Anginal  pain ([place])   ? Anxiety   ? Arthritis   ? "feet, knees, back" (05/02/2016)  ? Asthma   ? CAD (coronary artery disease)   ? a. 04/2016: NSTEMI 95% stenosis 1st Mrg (s/p DES)  ? Chest pain   ? a. 10/2013 Exercise Myoview: Ef 65%, no ischemia.  ? CHF (congestive heart failure) ([place])   ? Chronic bronchitis ([place])   ? Chronic lower back pain   ? COPD (chronic obstructive pulmonary disease) ([place])   ? GERD (gastroesophageal reflux disease)   ? History of blood transfusion 1960s  ? "related to my hysterectomy"  ? Hypercholesterolemia   ? Hypertension   ? Lower extremity edema   ? Mitral valve prolapse   ? NSTEMI (non-ST elevated myocardial infarction) ([place]) 05/01/2016  ? Pneumonia 05/01/2016  ? "saw trace of slight pneumonia/CT scan" (05/02/2016)  ? Seasonal allergies   ? Sjogren's syndrome ([place])   ? Wheezing   ? ?Current Medications: ?Current Meds  ?Medication Sig  ? albuterol (PROVENTIL) (2.5 MG/3ML) 0.083% nebulizer solution Take 3 mLs (2.5 mg total) by nebulization every 2 (two) hours as needed for wheezing or shortness of breath.  ? ALPRAZolam (XANAX) 0.5 MG tablet Take 0.5 mg by mouth daily as needed for anxiety.   ? aspirin EC 81 MG tablet  Take 81 mg by mouth daily.  ? budesonide-formoterol (SYMBICORT) 160-4.5 MCG/ACT inhaler Inhale 2 puffs into the lungs 2 (two) times daily.  ? Cholecalciferol (VITAMIN D3) 2000 UNITS TABS Take 2,000 Units by mouth daily.  ? cyanocobalamin 500 MCG tablet Take 1,000 mcg by mouth daily.  ? diltiazem (CARDIZEM CD) 240 MG 24 hr capsule Take 1 capsule (240 mg total) by mouth daily.  ? EPIPEN 2-PAK 0.3 MG/0.3ML SOAJ injection Inject 0.3 mg into the skin daily as needed (allergic reaction).   ? ezetimibe (ZETIA) 10 MG tablet Take 10 mg by mouth daily.  ? FLUoxetine (PROZAC) 10 MG tablet Take 10 mg by mouth daily.  ? fluticasone (FLONASE) 50 MCG/ACT nasal spray Place 2 sprays into the nose daily.  ? furosemide (LASIX) 20 MG tablet Take 1 tablet (20 mg total) by mouth every other day.  ? gabapentin  (NEURONTIN) 100 MG capsule Take 100 mg by mouth at bedtime.   ? metoprolol tartrate (LOPRESSOR) 25 MG tablet Take 0.5 tablets (12.5 mg total) by mouth 2 (two) times daily.  ? montelukast (SINGULAIR) 10 MG tablet Take 10 mg by mouth at bedtime.  ? Multiple Vitamin (MULTIVITAMIN WITH MINERALS) TABS tablet Take 1 tablet by mouth daily.  ? pantoprazole (PROTONIX) 40 MG tablet TAKE 2 TABLETS(80 MG) BY MOUTH DAILY  ? Polyvinyl Alcohol (LIQUID TEARS OP) Place 1 drop into both eyes daily as needed (dry eyes).  ? potassium chloride (KLOR-CON) 10 MEQ tablet Take 10 mEq by mouth 2 (two) times daily.  ? PROAIR HFA 108 (90 BASE) MCG/ACT inhaler Inhale 1-2 puffs into the lungs every 4 (four) hours as needed for wheezing.  ? rosuvastatin (CRESTOR) 20 MG tablet Take 1 tablet (20 mg total) by mouth daily at 6 PM.  ? [DISCONTINUED] isosorbide mononitrate (IMDUR) 30 MG 24 hr tablet Take 1 tablet (30 mg total) by mouth daily.  ? [DISCONTINUED] nitroGLYCERIN (NITROSTAT) 0.4 MG SL tablet Place 1 tablet (0.4 mg total) under the tongue every 5 (five) minutes as needed for chest pain (up to 3 doses. If taking 3rd dose call 911).  ?  ?Allergies:   Levaquin [levofloxacin], Plavix [clopidogrel bisulfate], Ciprofibrate, Codeine, Drug ingredient [ticagrelor], Nitrofurantoin, Ciprofloxacin, Penicillins, and Sulfa antibiotics  ? ?Social History  ? ?Tobacco Use  ? Smoking status: Never  ? Smokeless tobacco: Never  ?Vaping Use  ? Vaping Use: Never used  ?Substance Use Topics  ? Alcohol use: No  ?  Alcohol/week: 0.0 standard drinks  ? Drug use: No  ?  ?Family Hx: ?The patient's family history includes Heart disease in her father; Hypertension in her mother; Ovarian cancer in her sister; Stroke in her mother. ? ?Review of Systems  ?Gastrointestinal:  Negative for hematochezia.  ?Genitourinary:  Negative for hematuria.   ? ?EKGs/Labs/Other Test Reviewed:   ? ?EKG:  EKG is   ordered today.  The ekg ordered today demonstrates NSR, HR 76, normal axis,  inferior Q waves, no ST-T wave changes, QTc 454 ? ?Recent Labs: ?02/16/2021: TSH 2.980 ?02/17/2021: ALT 16 ?02/18/2021: Hemoglobin 11.1; Magnesium 2.2; Platelets 258 ?04/05/2021: NT-Pro BNP 648 ?05/10/2021: BUN 21; Creatinine, Ser 1.38; Potassium 4.9; Sodium 142  ? ?Recent Lipid Panel ?Recent Labs  ?  02/17/21 ?0154  ?CHOL 131  ?TRIG 132  ?HDL 46  ?VLDL 26  ?Hilda 59  ?  ? ?Risk Assessment/Calculations:   ?  ?    ?Physical Exam:   ? ?VS:  BP (!) 116/54   Pulse 76   Ht  $'5\' 5"'Y$  (1.651 m)   Wt 222 lb (100.7 kg)   SpO2 95%   BMI 36.94 kg/m?    ? ?Wt Readings from Last 3 Encounters:  ?02/04/22 222 lb (100.7 kg)  ?08/06/21 220 lb (99.8 kg)  ?04/05/21 219 lb 12.8 oz (99.7 kg)  ?  ?Constitutional:   ?   Appearance: Healthy appearance. Not in distress.  ?Neck:  ?   Vascular: No carotid bruit or JVR. JVD normal.  ?Pulmonary:  ?   Effort: Pulmonary effort is normal.  ?   Breath sounds: No wheezing. No rales.  ?Cardiovascular:  ?   Normal rate. Regular rhythm. Normal S1. Normal S2.   ?   Murmurs: There is no murmur.  ?Edema: ?   Peripheral edema present. ?   Pretibial: bilateral trace edema of the pretibial area. ?Abdominal:  ?   Palpations: Abdomen is soft.  ?Skin: ?   General: Skin is warm and dry.  ?Neurological:  ?   Mental Status: Alert and oriented to person, place and time.  ?   Cranial Nerves: Cranial nerves are intact.  ?  ?    ?ASSESSMENT & PLAN:   ?Chronic heart failure with preserved ejection fraction ([place]) ?EF 60-65 by echocardiogram in April 2022.  Her shortness of breath is mainly due to asthma/COPD.  Her volume is overall stable.  She only takes Lasix every few days.  Continue current medical regimen. ? ?Coronary artery disease involving native coronary artery of native heart without angina pectoris ?History of non-STEMI in 2017 treated with a drug-eluting stent to the OM1 and unstable angina in April 2022 treated with a DES to LAD and balloon angioplasty to the second diagonal.  Thank you she has been intolerant  to Brilinta in the past secondary to epistaxis.  She also had an allergy to Plavix and was switched to Effient.  Effient was stopped after last visit as she had completed 6 months of dual antiplatelet therapy af

## 2022-02-04 ENCOUNTER — Ambulatory Visit: Payer: Medicare PPO | Admitting: Physician Assistant

## 2022-02-04 ENCOUNTER — Encounter: Payer: Self-pay | Admitting: Physician Assistant

## 2022-02-04 VITALS — BP 116/54 | HR 76 | Ht 65.0 in | Wt 222.0 lb

## 2022-02-04 DIAGNOSIS — E785 Hyperlipidemia, unspecified: Secondary | ICD-10-CM | POA: Diagnosis not present

## 2022-02-04 DIAGNOSIS — G4733 Obstructive sleep apnea (adult) (pediatric): Secondary | ICD-10-CM | POA: Insufficient documentation

## 2022-02-04 DIAGNOSIS — I10 Essential (primary) hypertension: Secondary | ICD-10-CM | POA: Diagnosis not present

## 2022-02-04 DIAGNOSIS — I2510 Atherosclerotic heart disease of native coronary artery without angina pectoris: Secondary | ICD-10-CM | POA: Diagnosis not present

## 2022-02-04 DIAGNOSIS — I5032 Chronic diastolic (congestive) heart failure: Secondary | ICD-10-CM

## 2022-02-04 DIAGNOSIS — N1831 Chronic kidney disease, stage 3a: Secondary | ICD-10-CM | POA: Diagnosis not present

## 2022-02-04 MED ORDER — NITROGLYCERIN 0.4 MG SL SUBL: 0.4000 mg | SUBLINGUAL_TABLET | SUBLINGUAL | 3 refills | Status: AC | PRN

## 2022-02-04 MED ORDER — ISOSORBIDE MONONITRATE ER 30 MG PO TB24: 30.0000 mg | ORAL_TABLET | Freq: Every day | ORAL | 3 refills | Status: DC

## 2022-02-04 NOTE — Assessment & Plan Note (Signed)
Labs obtained through [facility] - personally reviewed and interpreted:   ?10/13/2021: Total cholesterol 147, HDL 50, LDL 69, triglycerides 139, ALT 15 ?She notes her PCP has recently placed her on ezetimibe 10 mg daily.  Continue rosuvastatin 20 mg daily.  ?

## 2022-02-04 NOTE — Assessment & Plan Note (Signed)
EF 60-65 by echocardiogram in April 2022.  Her shortness of breath is mainly due to asthma/COPD.  Her volume is overall stable.  She only takes Lasix every few days.  Continue current medical regimen. ?

## 2022-02-04 NOTE — Assessment & Plan Note (Signed)
She never had CPAP titration completed.  I will reach out to our CMA with sleep medicine to get everything arranged ? ?

## 2022-02-04 NOTE — Assessment & Plan Note (Signed)
Blood pressure is well controlled.  Continue Cardizem CD 240 milligrams daily, isosorbide mononitrate 30 mg daily, metoprolol tartrate 12.5 mg twice daily. ?

## 2022-02-04 NOTE — Assessment & Plan Note (Signed)
History of non-STEMI in 2017 treated with a drug-eluting stent to the OM1 and unstable angina in April 2022 treated with a DES to LAD and balloon angioplasty to the second diagonal.  Thank you she has been intolerant to Brilinta in the past secondary to epistaxis.  She also had an allergy to Plavix and was switched to Effient.  Effient was stopped after last visit as she had completed 6 months of dual antiplatelet therapy after PCI.  She is currently doing well without anginal symptoms.  Continue aspirin 81 mg daily, isosorbide mononitrate 30 mg daily, metoprolol tartrate 12.5 mg twice daily, rosuvastatin 20 mg daily. ?

## 2022-02-04 NOTE — Patient Instructions (Addendum)
Medication Instructions:  ?.Your physician recommends that you continue on your current medications as directed. Please refer to the Current Medication list given to you today. ? ?*If you need a refill on your cardiac medications before your next appointment, please call your pharmacy* ? ? ?Lab Work: ?None ordered ? ?If you have labs (blood work) drawn today and your tests are completely normal, you will receive your results only by: ?MyChart Message (if you have MyChart) OR ?A paper copy in the mail ?If you have any lab test that is abnormal or we need to change your treatment, we will call you to review the results. ? ? ?Testing/Procedures: ?None ordered ? ? ?Follow-Up: ?At [facility], you and your health needs are our priority.  As part of our continuing mission to provide you with exceptional heart care, we have created designated Provider Care Teams.  These Care Teams include your primary Cardiologist (physician) and Advanced Practice Providers (APPs -  Physician Assistants and Nurse Practitioners) who all work together to provide you with the care you need, when you need it. ? ?We recommend signing up for the patient portal called "MyChart".  Sign up information is provided on this After Visit Summary.  MyChart is used to connect with patients for Virtual Visits (Telemedicine).  Patients are able to view lab/test results, encounter notes, upcoming appointments, etc.  Non-urgent messages can be sent to your provider as well.   ?To learn more about what you can do with MyChart, go to [email or web address].   ? ?Your next appointment:   ?6 month(s) ? ?The format for your next appointment:   ?In Person ? ?Provider:   ?Sherren Mocha, MD  or Richardson Dopp, PA-C       ? ? ?Other Instructions ? ?

## 2022-02-04 NOTE — Assessment & Plan Note (Signed)
Most recent creatinine was 1.38. ?

## 2022-02-07 NOTE — Telephone Encounter (Signed)
Prior Authorization for TITRATION sent to [facility] via web portal.  ?AUTHORIZATION NUMBER [id number] ?VALID DATES   Mar 05 2022 - Apr 04 2022 ?

## 2022-02-25 DIAGNOSIS — M1712 Unilateral primary osteoarthritis, left knee: Secondary | ICD-10-CM | POA: Diagnosis not present

## 2022-03-16 DIAGNOSIS — H0014 Chalazion left upper eyelid: Secondary | ICD-10-CM | POA: Diagnosis not present

## 2022-03-20 ENCOUNTER — Other Ambulatory Visit: Payer: Self-pay | Admitting: Cardiology

## 2022-04-01 DIAGNOSIS — H2000 Unspecified acute and subacute iridocyclitis: Secondary | ICD-10-CM | POA: Diagnosis not present

## 2022-04-01 DIAGNOSIS — H18593 Other hereditary corneal dystrophies, bilateral: Secondary | ICD-10-CM | POA: Diagnosis not present

## 2022-04-07 ENCOUNTER — Other Ambulatory Visit: Payer: Self-pay | Admitting: Cardiovascular Disease

## 2022-04-07 DIAGNOSIS — H2000 Unspecified acute and subacute iridocyclitis: Secondary | ICD-10-CM | POA: Diagnosis not present

## 2022-04-13 DIAGNOSIS — H2000 Unspecified acute and subacute iridocyclitis: Secondary | ICD-10-CM | POA: Diagnosis not present

## 2022-04-20 DIAGNOSIS — H169 Unspecified keratitis: Secondary | ICD-10-CM | POA: Diagnosis not present

## 2022-05-09 DIAGNOSIS — I129 Hypertensive chronic kidney disease with stage 1 through stage 4 chronic kidney disease, or unspecified chronic kidney disease: Secondary | ICD-10-CM | POA: Diagnosis not present

## 2022-05-09 DIAGNOSIS — E785 Hyperlipidemia, unspecified: Secondary | ICD-10-CM | POA: Diagnosis not present

## 2022-05-09 DIAGNOSIS — I10 Essential (primary) hypertension: Secondary | ICD-10-CM | POA: Diagnosis not present

## 2022-05-09 DIAGNOSIS — I2510 Atherosclerotic heart disease of native coronary artery without angina pectoris: Secondary | ICD-10-CM | POA: Diagnosis not present

## 2022-05-09 DIAGNOSIS — N1832 Chronic kidney disease, stage 3b: Secondary | ICD-10-CM | POA: Diagnosis not present

## 2022-05-09 DIAGNOSIS — D649 Anemia, unspecified: Secondary | ICD-10-CM | POA: Diagnosis not present

## 2022-05-09 DIAGNOSIS — R7301 Impaired fasting glucose: Secondary | ICD-10-CM | POA: Diagnosis not present

## 2022-05-12 ENCOUNTER — Other Ambulatory Visit: Payer: Self-pay | Admitting: Family

## 2022-05-17 ENCOUNTER — Ambulatory Visit (INDEPENDENT_AMBULATORY_CARE_PROVIDER_SITE_OTHER): Payer: Medicare PPO

## 2022-05-17 ENCOUNTER — Ambulatory Visit: Payer: Medicare PPO | Admitting: Podiatry

## 2022-05-17 ENCOUNTER — Encounter: Payer: Self-pay | Admitting: Podiatry

## 2022-05-17 DIAGNOSIS — Q666 Other congenital valgus deformities of feet: Secondary | ICD-10-CM | POA: Diagnosis not present

## 2022-05-17 DIAGNOSIS — M76811 Anterior tibial syndrome, right leg: Secondary | ICD-10-CM

## 2022-05-17 DIAGNOSIS — M76821 Posterior tibial tendinitis, right leg: Secondary | ICD-10-CM | POA: Diagnosis not present

## 2022-05-17 DIAGNOSIS — M778 Other enthesopathies, not elsewhere classified: Secondary | ICD-10-CM

## 2022-05-17 DIAGNOSIS — M19071 Primary osteoarthritis, right ankle and foot: Secondary | ICD-10-CM | POA: Diagnosis not present

## 2022-05-17 MED ORDER — TRIAMCINOLONE ACETONIDE 40 MG/ML IJ SUSP
40.0000 mg | Freq: Once | INTRAMUSCULAR | Status: AC
  Administered 2022-05-17: 40 mg

## 2022-05-17 NOTE — Progress Notes (Signed)
Subjective:  Patient ID: Donna Williamson, female    DOB: 09/27/1946,  MRN: [id number] HPI Chief Complaint  Patient presents with   Foot Pain    Medial foot right - aching, swelling x 1 year, worsening, tried resting and elevating, also says her toes flop when she walks (has had injuries in the past), balance issues   New Patient (Initial Visit)    Est pt 2016    76 y.o. female presents with the above complaint.   ROS: Denies fever chills nausea vomit muscle aches pains calf pain back pain chest pain shortness of breath.  Past Medical History:  Diagnosis Date   Anemia    Anginal pain ([place])    Anxiety    Arthritis    "feet, knees, back" (05/02/2016)   Asthma    CAD (coronary artery disease)    a. 04/2016: NSTEMI 95% stenosis 1st Mrg (s/p DES)   Chest pain    a. 10/2013 Exercise Myoview: Ef 65%, no ischemia.   CHF (congestive heart failure) (HCC)    Chronic bronchitis (HCC)    Chronic lower back pain    COPD (chronic obstructive pulmonary disease) (HCC)    GERD (gastroesophageal reflux disease)    History of blood transfusion 1960s   "related to my hysterectomy"   Hypercholesterolemia    Hypertension    Lower extremity edema    Mitral valve prolapse    NSTEMI (non-ST elevated myocardial infarction) ([place]) 05/01/2016   Pneumonia 05/01/2016   "saw trace of slight pneumonia/CT scan" (05/02/2016)   Seasonal allergies    Sjogren's syndrome ([place])    Wheezing    Past Surgical History:  Procedure Laterality Date   ABDOMINAL HYSTERECTOMY  1960s   "partial"   CARDIAC CATHETERIZATION N/A 05/02/2016   Procedure: Right/Left Heart Cath and Coronary Angiography;  Surgeon: Jettie Booze, MD;  Location: [facility];  Service: Cardiovascular;  Laterality: N/A;   CARDIAC CATHETERIZATION N/A 05/02/2016   Procedure: Coronary Stent Intervention;  Surgeon: Jettie Booze, MD;  Location: [facility];  Service: Cardiovascular;  Laterality: N/A;   CATARACT EXTRACTION W/PHACO  Right 09/20/2019   Procedure: CATARACT EXTRACTION PHACO AND INTRAOCULAR LENS PLACEMENT ([place]) RIGHT;  Surgeon: Birder Robson, MD;  Location: [facility];  Service: Ophthalmology;  Laterality: Right;  [place] 01:29.4 CDE 19.28 FLUID PACK LOT # [id number]   CATARACT EXTRACTION W/PHACO Left 10/15/2019   Procedure: CATARACT EXTRACTION PHACO AND INTRAOCULAR LENS PLACEMENT ([facility]) LEFT 8.76, 00:48.8;  Surgeon: Birder Robson, MD;  Location: [facility];  Service: Ophthalmology;  Laterality: Left;   COLONOSCOPY W/ BIOPSIES AND POLYPECTOMY     CORONARY ANGIOPLASTY WITH STENT PLACEMENT  05/02/2016   "1 stent"   CORONARY STENT INTERVENTION N/A 02/17/2021   Procedure: CORONARY STENT INTERVENTION;  Surgeon: Burnell Blanks, MD;  Location: [facility];  Service: Cardiovascular;  Laterality: N/A;   ESOPHAGOGASTRODUODENOSCOPY     INTRAVASCULAR PRESSURE WIRE/FFR STUDY N/A 02/17/2021   Procedure: INTRAVASCULAR PRESSURE WIRE/FFR STUDY;  Surgeon: Burnell Blanks, MD;  Location: [facility];  Service: Cardiovascular;  Laterality: N/A;   JOINT REPLACEMENT Left 2007   hip   LAPAROSCOPIC SALPINGOOPHERECTOMY Bilateral ~ 1990   LEFT HEART CATH AND CORONARY ANGIOGRAPHY N/A 02/17/2021   Procedure: LEFT HEART CATH AND CORONARY ANGIOGRAPHY;  Surgeon: Burnell Blanks, MD;  Location: [facility];  Service: Cardiovascular;  Laterality: N/A;   TOTAL HIP ARTHROPLASTY Left 2007    Current Outpatient Medications:  albuterol (PROVENTIL) (2.5 MG/3ML) 0.083% nebulizer solution, Take 3 mLs (2.5 mg total) by nebulization every 2 (two) hours as needed for wheezing or shortness of breath., Disp: 75 mL, Rfl: 2   ALPRAZolam (XANAX) 0.5 MG tablet, Take 0.5 mg by mouth daily as needed for anxiety. , Disp: , Rfl:    aspirin EC 81 MG tablet, Take 81 mg by mouth daily., Disp: , Rfl:    budesonide-formoterol (SYMBICORT) 160-4.5 MCG/ACT inhaler, Inhale 2 puffs into the lungs 2 (two) times daily.,  Disp: , Rfl:    Cholecalciferol (VITAMIN D3) 2000 UNITS TABS, Take 2,000 Units by mouth daily., Disp: , Rfl:    clobetasol ointment (TEMOVATE) 0.05 %, Apply topically., Disp: , Rfl:    cyanocobalamin 500 MCG tablet, Take 1,000 mcg by mouth daily., Disp: , Rfl:    diltiazem (CARDIZEM CD) 240 MG 24 hr capsule, Take 1 capsule (240 mg total) by mouth daily., Disp: 90 capsule, Rfl: 3   EPIPEN 2-PAK 0.3 MG/0.3ML SOAJ injection, Inject 0.3 mg into the skin daily as needed (allergic reaction). , Disp: , Rfl:    estradiol (ESTRACE) 1 MG tablet, Take 1 mg by mouth daily., Disp: , Rfl:    ezetimibe (ZETIA) 10 MG tablet, Take 10 mg by mouth daily., Disp: , Rfl:    FLUoxetine (PROZAC) 10 MG capsule, Take 10 mg by mouth daily., Disp: , Rfl:    fluticasone (FLONASE) 50 MCG/ACT nasal spray, Place 2 sprays into the nose daily., Disp: , Rfl:    furosemide (LASIX) 20 MG tablet, TAKE 1 TABLET(20 MG) BY MOUTH EVERY OTHER DAY, Disp: 45 tablet, Rfl: 2   gabapentin (NEURONTIN) 100 MG capsule, Take 100 mg by mouth at bedtime. , Disp: , Rfl:    isosorbide mononitrate (IMDUR) 30 MG 24 hr tablet, Take 1 tablet (30 mg total) by mouth daily., Disp: 90 tablet, Rfl: 3   MAXITROL 0.1 % ophthalmic suspension, Place 1 drop into the left eye 3 (three) times daily., Disp: , Rfl:    metoprolol tartrate (LOPRESSOR) 25 MG tablet, Take 0.5 tablets (12.5 mg total) by mouth 2 (two) times daily. Please call the office at [phone] to schedule your October  follow-up appointment for future refills. Thank you., Disp: 90 tablet, Rfl: 1   montelukast (SINGULAIR) 10 MG tablet, Take 10 mg by mouth at bedtime., Disp: , Rfl:    Multiple Vitamin (MULTIVITAMIN WITH MINERALS) TABS tablet, Take 1 tablet by mouth daily., Disp: , Rfl:    nitroGLYCERIN (NITROSTAT) 0.4 MG SL tablet, Place 1 tablet (0.4 mg total) under the tongue every 5 (five) minutes as needed for chest pain (up to 3 doses. If taking 3rd dose call 911)., Disp: 25 tablet, Rfl: 3    pantoprazole (PROTONIX) 40 MG tablet, TAKE 2 TABLETS(80 MG) BY MOUTH DAILY, Disp: 180 tablet, Rfl: 3   Polyvinyl Alcohol (LIQUID TEARS OP), Place 1 drop into both eyes daily as needed (dry eyes)., Disp: , Rfl:    potassium chloride (KLOR-CON) 10 MEQ tablet, Take 10 mEq by mouth daily., Disp: , Rfl:    PROAIR HFA 108 (90 BASE) MCG/ACT inhaler, Inhale 1-2 puffs into the lungs every 4 (four) hours as needed for wheezing., Disp: , Rfl:    rosuvastatin (CRESTOR) 20 MG tablet, Take 1 tablet (20 mg total) by mouth daily at 6 PM., Disp: 30 tablet, Rfl: 0   trimethoprim-polymyxin b (POLYTRIM) ophthalmic solution, SMARTSIG:In Eye(s), Disp: , Rfl:    VALTREX 1 g tablet, Take 1,000 mg by mouth  3 (three) times daily., Disp: , Rfl:   Allergies  Allergen Reactions   Levaquin [Levofloxacin] Anaphylaxis   Plavix [Clopidogrel Bisulfate] Rash   Ciprofibrate     Other reaction(s): swelling, muscle aches   Codeine Nausea Only   Drug Ingredient [Ticagrelor] Other (See Comments)    Patient reports hemorrhage after medical trial of brilinta in 2017   Nitrofurantoin Other (See Comments)    unknown VISION CHANGES   Ciprofloxacin Rash   Penicillins Rash    Has patient had a PCN reaction causing immediate rash, facial/tongue/throat swelling, SOB or lightheadedness with hypotension: No Has patient had a PCN reaction causing severe rash involving mucus membranes or skin necrosis: No Has patient had a PCN reaction that required hospitalization: No Has patient had a PCN reaction occurring within the last 10 years: No If all of the above answers are "NO", then may proceed with Cephalosporin use.   Sulfa Antibiotics Rash   Review of Systems Objective:  There were no vitals filed for this visit.  General: Well developed, nourished, in no acute distress, alert and oriented x3   Dermatological: Skin is warm, dry and supple bilateral. Nails x 10 are well maintained; remaining integument appears unremarkable at this  time. There are no open sores, no preulcerative lesions, no rash or signs of infection present.  Vascular: Dorsalis Pedis artery and Posterior Tibial artery pedal pulses are 2/4 bilateral with immedate capillary fill time. Pedal hair growth present. No varicosities and no lower extremity edema present bilateral.   Neruologic: Grossly intact via light touch bilateral. Vibratory intact via tuning fork bilateral.  She has significant loss of proprioceptive and protective threshold sensations present lasting monofilament.  Most likely idiopathic.  Patellar and Achilles deep tendon reflexes 2+ bilateral. No Babinski or clonus noted bilateral.   Musculoskeletal: No gross boney pedal deformities bilateral. No pain, crepitus, or limitation noted with foot and ankle range of motion bilateral. Muscular strength 5/5 in all groups tested bilateral.  Pain on palpation of the distal aspect of the tibialis anterior tendon and of the posterior tibial tendon.  She also has pain on frontal plane range of motion of the midfoot.  Gait: Unassisted, Nonantalgic.    Radiographs:  Radiographs taken today demonstrate pes planovalgus with abduction of the forefoot on the rear foot severe osteoarthritis of the tarsometatarsal joints of the right foot.  A very prominent navicular tuberosity.  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  Pes planovalgus bilateral.  Plan: Discussed etiology pathology conservative surgical therapies at this point I injected the point of maximal tenderness of the tibialis anterior tendon with dexamethasone and the tendon sheath of the posterior tibial tendon with 10 mg of Kenalog.  Discussed appropriate shoe gear stretching ice therapy and anti-inflammatories.  I like to follow-up with her in 1 month     Takenya Travaglini T. [place], [place]

## 2022-05-31 DIAGNOSIS — M25562 Pain in left knee: Secondary | ICD-10-CM | POA: Diagnosis not present

## 2022-05-31 DIAGNOSIS — G8929 Other chronic pain: Secondary | ICD-10-CM | POA: Diagnosis not present

## 2022-06-16 ENCOUNTER — Encounter: Payer: Self-pay | Admitting: Podiatry

## 2022-06-16 ENCOUNTER — Ambulatory Visit: Payer: Medicare PPO | Admitting: Podiatry

## 2022-06-16 DIAGNOSIS — M722 Plantar fascial fibromatosis: Secondary | ICD-10-CM | POA: Diagnosis not present

## 2022-06-16 DIAGNOSIS — M76821 Posterior tibial tendinitis, right leg: Secondary | ICD-10-CM

## 2022-06-16 DIAGNOSIS — M76811 Anterior tibial syndrome, right leg: Secondary | ICD-10-CM

## 2022-06-16 DIAGNOSIS — Q666 Other congenital valgus deformities of feet: Secondary | ICD-10-CM

## 2022-06-16 MED ORDER — TRIAMCINOLONE ACETONIDE 40 MG/ML IJ SUSP
20.0000 mg | Freq: Once | INTRAMUSCULAR | Status: AC
  Administered 2022-06-16: 20 mg

## 2022-06-16 NOTE — Progress Notes (Signed)
She presents today for follow-up of her posterior tibial tendon right foot.  She denies fever chills nausea vomiting muscle aches and pains states that it still is kind of tender back and here she points to the heel and the posterior tibial tendon area.  Objective: She has some tenderness on palpation of the posterior tibial tendon and the places of the medial longitudinal arch where it attaches..  She also has pain on palpation medial calcaneal tubercle of the heel.  Assessment: Fasciitis most likely resulting in some of her posterior tibial tendon symptoms.  Plan: I injected the area at the plantar fascia calcaneal insertion site and we will follow-up with her in the near future for further treatment.

## 2022-07-18 ENCOUNTER — Ambulatory Visit: Payer: Medicare PPO | Admitting: Podiatry

## 2022-08-01 ENCOUNTER — Encounter: Payer: Self-pay | Admitting: Cardiovascular Disease

## 2022-08-01 ENCOUNTER — Telehealth: Payer: Self-pay | Admitting: *Deleted

## 2022-08-01 ENCOUNTER — Ambulatory Visit: Payer: Medicare PPO | Attending: Cardiovascular Disease | Admitting: Cardiovascular Disease

## 2022-08-01 VITALS — BP 126/68 | HR 66 | Ht 65.0 in | Wt 224.2 lb

## 2022-08-01 DIAGNOSIS — E782 Mixed hyperlipidemia: Secondary | ICD-10-CM | POA: Diagnosis not present

## 2022-08-01 DIAGNOSIS — N1831 Chronic kidney disease, stage 3a: Secondary | ICD-10-CM

## 2022-08-01 DIAGNOSIS — I5032 Chronic diastolic (congestive) heart failure: Secondary | ICD-10-CM | POA: Diagnosis not present

## 2022-08-01 DIAGNOSIS — I25118 Atherosclerotic heart disease of native coronary artery with other forms of angina pectoris: Secondary | ICD-10-CM

## 2022-08-01 DIAGNOSIS — R400 Somnolence: Secondary | ICD-10-CM

## 2022-08-01 DIAGNOSIS — I10 Essential (primary) hypertension: Secondary | ICD-10-CM

## 2022-08-01 NOTE — Progress Notes (Signed)
Cardiology Office Note:    Date:  08/01/2022   ID:  Donna Williamson, Donna Williamson 1946-07-14, MRN [id number]  PCP:  Crist Infante, [place] Providers Cardiologist:  Sherren Mocha, MD     Referring MD: Crist Infante, MD   Chief Complaint  Patient presents with   Coronary Artery Disease    History of Present Illness:    Donna Williamson is a 76 y.o. female with a hx of: Coronary artery disease  NSTEMI 04/2016 s/p DES to [place] 3/21: low risk S/p DES to LAD and POBA of D2 in 01/2021 Effient Rx due to allergy to Brilinta and Plavix  Intol to Brilinta (epistaxis) Allergy to Plavix  (HFpEF) heart failure with preserved ejection fraction  Hypertension  Hyperlipidemia  Chronic kidney disease  COPD GERD Sjogren's syndrome  Sleep apnea   The patient is here alone today. We discussed consideration of a sleep study after a past history of a non-diagnostic study. She doesn't feel rested when she wakes up in the morning. Her spouse reports that she snores. She has some pain in the right shoulder blade area but carries her purse on that side and thinks it's related to this. No chest pain or pressure.  She continues to have some issues with seasonal allergies and uses albuterol as needed as well as maintenance therapy with Symbicort.  She denies any recent problems with edema, orthopnea, PND, or heart palpitations.  She has been under a lot of stress, caring for her elderly mother who has squamous cell carcinoma of the mouth and has declined surgery because of her advanced age.  Past Medical History:  Diagnosis Date   Anemia    Anginal pain ([place])    Anxiety    Arthritis    "feet, knees, back" (05/02/2016)   Asthma    CAD (coronary artery disease)    a. 04/2016: NSTEMI 95% stenosis 1st Mrg (s/p DES)   Chest pain    a. 10/2013 Exercise Myoview: Ef 65%, no ischemia.   CHF (congestive heart failure) (HCC)    Chronic bronchitis (HCC)    Chronic lower back pain    COPD  (chronic obstructive pulmonary disease) (HCC)    GERD (gastroesophageal reflux disease)    History of blood transfusion 1960s   "related to my hysterectomy"   Hypercholesterolemia    Hypertension    Lower extremity edema    Mitral valve prolapse    NSTEMI (non-ST elevated myocardial infarction) ([place]) 05/01/2016   Pneumonia 05/01/2016   "saw trace of slight pneumonia/CT scan" (05/02/2016)   Seasonal allergies    Sjogren's syndrome ([place])    Wheezing     Past Surgical History:  Procedure Laterality Date   ABDOMINAL HYSTERECTOMY  1960s   "partial"   CARDIAC CATHETERIZATION N/A 05/02/2016   Procedure: Right/Left Heart Cath and Coronary Angiography;  Surgeon: Jettie Booze, MD;  Location: [facility];  Service: Cardiovascular;  Laterality: N/A;   CARDIAC CATHETERIZATION N/A 05/02/2016   Procedure: Coronary Stent Intervention;  Surgeon: Jettie Booze, MD;  Location: [facility];  Service: Cardiovascular;  Laterality: N/A;   CATARACT EXTRACTION W/PHACO Right 09/20/2019   Procedure: CATARACT EXTRACTION PHACO AND INTRAOCULAR LENS PLACEMENT ([place]) RIGHT;  Surgeon: Birder Robson, MD;  Location: [facility];  Service: Ophthalmology;  Laterality: Right;  [place] 01:29.4 CDE 19.28 FLUID PACK LOT # [id number] H   CATARACT EXTRACTION W/PHACO Left 10/15/2019   Procedure: CATARACT EXTRACTION PHACO AND INTRAOCULAR LENS PLACEMENT ([facility])  LEFT 8.76, 00:48.8;  Surgeon: Birder Robson, MD;  Location: [facility];  Service: Ophthalmology;  Laterality: Left;   COLONOSCOPY W/ BIOPSIES AND POLYPECTOMY     CORONARY ANGIOPLASTY WITH STENT PLACEMENT  05/02/2016   "1 stent"   CORONARY STENT INTERVENTION N/A 02/17/2021   Procedure: CORONARY STENT INTERVENTION;  Surgeon: Burnell Blanks, MD;  Location: [facility];  Service: Cardiovascular;  Laterality: N/A;   ESOPHAGOGASTRODUODENOSCOPY     INTRAVASCULAR PRESSURE WIRE/FFR STUDY N/A 02/17/2021   Procedure: INTRAVASCULAR PRESSURE WIRE/FFR  STUDY;  Surgeon: Burnell Blanks, MD;  Location: [facility];  Service: Cardiovascular;  Laterality: N/A;   JOINT REPLACEMENT Left 2007   hip   LAPAROSCOPIC SALPINGOOPHERECTOMY Bilateral ~ 1990   LEFT HEART CATH AND CORONARY ANGIOGRAPHY N/A 02/17/2021   Procedure: LEFT HEART CATH AND CORONARY ANGIOGRAPHY;  Surgeon: Burnell Blanks, MD;  Location: [facility];  Service: Cardiovascular;  Laterality: N/A;   TOTAL HIP ARTHROPLASTY Left 2007    Current Medications: No outpatient medications have been marked as taking for the 08/01/22 encounter (Office Visit) with Sherren Mocha, MD.     Allergies:   Levaquin [levofloxacin], Plavix [clopidogrel bisulfate], Ciprofibrate, Codeine, Drug ingredient [ticagrelor], Nitrofurantoin, Ciprofloxacin, Penicillins, and Sulfa antibiotics   Social History   Socioeconomic History   Marital status: Married    Spouse name: Not on file   Number of children: Not on file   Years of education: Not on file   Highest education level: Not on file  Occupational History   Not on file  Tobacco Use   Smoking status: Never   Smokeless tobacco: Never  Vaping Use   Vaping Use: Never used  Substance and Sexual Activity   Alcohol use: No    Alcohol/week: 0.0 standard drinks of alcohol   Drug use: No   Sexual activity: Yes  Other Topics Concern   Not on file  Social History Narrative   Lives in [place] with husband.  Active but does not routinely exercise.   Social Determinants of Health   Financial Resource Strain: Not on file  Food Insecurity: Not on file  Transportation Needs: Not on file  Physical Activity: Not on file  Stress: Not on file  Social Connections: Not on file     Family History: The patient's family history includes Heart disease in her father; Hypertension in her mother; Ovarian cancer in her sister; Stroke in her mother.  ROS:   Please see the history of present illness.    All other systems reviewed and are  negative.  EKGs/Labs/Other Studies Reviewed:    The following studies were reviewed today: Cardiac Cath 02/17/2021: Ost 2nd Diag to 2nd Diag lesion is 70% stenosed. Previously placed 1st Mrg stent (unknown type) is widely patent. Prox Cx to Mid Cx lesion is 40% stenosed. Prox LAD to Mid LAD lesion is 40% stenosed. 2nd Diag lesion is 80% stenosed. Mid LAD lesion is 80% stenosed. Post intervention, there is a 80% residual stenosis. A drug-eluting stent was successfully placed using a SYNERGY XD 2.50X16. Post intervention, there is a 0% residual stenosis.   1. Moderate, calcified proximal LAD stenosis.  2. Severe mid LAD stenosis at the takeoff of the moderate caliber second Diagonal branch. RFR of the mid LAD lesion was 0.84 suggesting the lesion was flow limiting. The ostium of the moderate caliber second Diagonal branch had a severe stenosis.  3. Patent obtuse marginal stent 4. Non-dominant RCA with mild plaque 5. Successful  balloon angioplasty of the ostium of the Diagonal branch. This was done prior to stenting of the LAD. TIMI-3 flow down the Diagonal branch post LAD stenting.  6. Successful PTCA/DES x 1 mid LAD.  7. Preserved LV systolic function.    Recommendations: Will continue DAPT with ASA and Plavix for at least six months. Continue statin. I would expect that she will be watched overnight and discharged home tomorrow.    Diagnostic Dominance: Co-dominant  Intervention      Echo:  1. Normal GLS -17.4. Left ventricular ejection fraction, by estimation,  is 60 to 65%. The left ventricle has normal function. The left ventricle  has no regional wall motion abnormalities. Left ventricular diastolic  parameters were normal.   2. Right ventricular systolic function is normal. The right ventricular  size is normal.   3. Left atrial size was mildly dilated.   4. The mitral valve is abnormal. Trivial mitral valve regurgitation. No  evidence of mitral stenosis.   5. Severe  nodular calcification of the left coronary cusp. The aortic  valve is calcified. There is severe calcifcation of the aortic valve.  There is moderate thickening of the aortic valve. Aortic valve  regurgitation is not visualized. No aortic  stenosis is present.   6. The inferior vena cava is normal in size with greater than 50%  respiratory variability, suggesting right atrial pressure of 3 mmHg.   EKG:  EKG is not ordered today.  Recent Labs: No results found for requested labs within last 365 days.  Recent Lipid Panel    Component Value Date/Time   CHOL 131 02/17/2021 0154   TRIG 132 02/17/2021 0154   HDL 46 02/17/2021 0154   CHOLHDL 2.8 02/17/2021 0154   VLDL 26 02/17/2021 0154   LDLCALC 59 02/17/2021 0154     Risk Assessment/Calculations:           STOP-Bang Score:  6       Physical Exam:    VS:  BP 126/68   Pulse 66   Ht '5\' 5"'$  (1.651 m)   Wt 224 lb 3.2 oz (101.7 kg)   SpO2 93%   BMI 37.31 kg/m     Wt Readings from Last 3 Encounters:  08/01/22 224 lb 3.2 oz (101.7 kg)  02/04/22 222 lb (100.7 kg)  08/06/21 220 lb (99.8 kg)     GEN:  Well nourished, well developed in no acute distress HEENT: Normal NECK: No JVD; No carotid bruits LYMPHATICS: No lymphadenopathy CARDIAC: RRR, no murmurs, rubs, gallops RESPIRATORY:  Clear to auscultation without rales, wheezing or rhonchi  ABDOMEN: Soft, non-tender, non-distended MUSCULOSKELETAL:  No edema; No deformity  SKIN: Warm and dry NEUROLOGIC:  Alert and oriented x 3 PSYCHIATRIC:  Normal affect   ASSESSMENT:    1. Coronary artery disease involving native coronary artery of native heart with other form of angina pectoris ([place])   2. Essential hypertension   3. Stage 3a chronic kidney disease ([place])   4. Chronic heart failure with preserved ejection fraction (HCC)   5. Mixed hyperlipidemia   6. Daytime somnolence    PLAN:    In order of problems listed above:  The patient is clinically stable on aspirin for  antiplatelet therapy, combination of rosuvastatin and Zetia for lipid lowering, and a low-dose of metoprolol and isosorbide for her antianginal regimen.  She is not having any anginal chest discomfort with physical activity or at rest.  I will plan to see her back in 1 year  and we will have her see an APP in 6 months.  Her bruising has gotten much better since discontinuation of prasugrel. Blood pressure is well controlled on current medical regimen. Most recent labs reviewed with creatinine 1.38.  She follows regularly with with primary care. Clinically stable, treated with furosemide 20 mg every other day.  No signs of volume overload on exam.  No significant functional limitation at present. Treated with a combination of rosuvastatin and ezetimibe.  Followed by Dr. Joylene Draft. We discussed formal evaluation with a home sleep study.  She would like to pursue this.      Medication Adjustments/Labs and Tests Ordered: Current medicines are reviewed at length with the patient today.  Concerns regarding medicines are outlined above.  Orders Placed This Encounter  Procedures   [place] Sleep Study   No orders of the defined types were placed in this encounter.   Patient Instructions  Medication Instructions:  Your physician recommends that you continue on your current medications as directed. Please refer to the Current Medication list given to you today.  *If you need a refill on your cardiac medications before your next appointment, please call your pharmacy*   Lab Work: NONE If you have labs (blood work) drawn today and your tests are completely normal, you will receive your results only by: [place] (if you have MyChart) OR A paper copy in the mail If you have any lab test that is abnormal or we need to change your treatment, we will call you to review the results.   Testing/Procedures: [place] sleep study Your physician has recommended that you have a sleep study. This test  records several body functions during sleep, including: brain activity, eye movement, oxygen and carbon dioxide blood levels, heart rate and rhythm, breathing rate and rhythm, the flow of air through your mouth and nose, snoring, body muscle movements, and chest and belly movement.  Follow-Up: At [facility], you and your health needs are our priority.  As part of our continuing mission to provide you with exceptional heart care, we have created designated Provider Care Teams.  These Care Teams include your primary Cardiologist (physician) and Advanced Practice Providers (APPs -  Physician Assistants and Nurse Practitioners) who all work together to provide you with the care you need, when you need it.  Your next appointment:   6 month(s)  The format for your next appointment:   In Person  Provider:   Richardson Dopp, Ambrose Pancoast, or Christen Bame. Then, Dr Burt Knack will plan to see you in 1 year.     Important Information About Sugar         Signed, Sherren Mocha, MD  08/01/2022 1:18 PM    [place]

## 2022-08-01 NOTE — Patient Instructions (Signed)
Medication Instructions:  Your physician recommends that you continue on your current medications as directed. Please refer to the Current Medication list given to you today.  *If you need a refill on your cardiac medications before your next appointment, please call your pharmacy*   Lab Work: NONE If you have labs (blood work) drawn today and your tests are completely normal, you will receive your results only by: [facility] (if you have MyChart) OR A paper copy in the mail If you have any lab test that is abnormal or we need to change your treatment, we will call you to review the results.   Testing/Procedures: [place] sleep study Your physician has recommended that you have a sleep study. This test records several body functions during sleep, including: brain activity, eye movement, oxygen and carbon dioxide blood levels, heart rate and rhythm, breathing rate and rhythm, the flow of air through your mouth and nose, snoring, body muscle movements, and chest and belly movement.  Follow-Up: At [facility], you and your health needs are our priority.  As part of our continuing mission to provide you with exceptional heart care, we have created designated Provider Care Teams.  These Care Teams include your primary Cardiologist (physician) and Advanced Practice Providers (APPs -  Physician Assistants and Nurse Practitioners) who all work together to provide you with the care you need, when you need it.  Your next appointment:   6 month(s)  The format for your next appointment:   In Person  Provider:   Richardson Dopp, Ambrose Pancoast, or Christen Bame. Then, Dr Burt Knack will plan to see you in 1 year.     Important Information About Sugar

## 2022-08-01 NOTE — Telephone Encounter (Signed)
Prior Authorization for [facility] sent to [facility]- via Fax  NO PA REQ

## 2022-08-03 ENCOUNTER — Encounter (HOSPITAL_BASED_OUTPATIENT_CLINIC_OR_DEPARTMENT_OTHER): Payer: Medicare PPO | Admitting: Cardiology

## 2022-08-03 DIAGNOSIS — G4733 Obstructive sleep apnea (adult) (pediatric): Secondary | ICD-10-CM

## 2022-08-03 NOTE — Telephone Encounter (Signed)
Called and made the patient aware that she may proceed with the [facility] Sleep Study. PIN # provided to the patient. Patient made aware that she will be contacted after the test has been read with the results and any recommendations. Patient verbalized understanding and thanked me for the call.   Pt has been given PIN# [id number] and will do sleep study sometime between tonight and over the weekend

## 2022-08-04 NOTE — Procedures (Signed)
Patient Information Study Date: 08/03/22 Patient Name: Donna Williamson Patient ID: [id number] Birth Date: 10/08/2046 Age: 76 Gender: Female BMI: 37.5 (W=225 lb, H=5' 5'') Stopbang: 6 Referring Physician: Sherren Mocha, MD  TEST DESCRIPTION:  Home sleep apnea testing was completed using the WatchPat, a Type 1 device, utilizing peripheral arterial tonometry (PAT), chest movement, actigraphy, pulse oximetry, pulse rate, body position and snore.  AHI was calculated with apnea and hypopnea using valid sleep time as the denominator. RDI includes apneas, hypopneas, and RERAs.  The data acquired and the scoring of sleep and all associated events were performed in accordance with the recommended standards and specifications as outlined in the AASM Manual for the Scoring of Sleep and Associated Events 2.2.0 (2015).  FINDINGS:  1.  Moderate Obstructive Sleep Apnea with AHI 16.1/hr.   2.  No Central Sleep Apnea with pAHIc 2.1/hr.  3.  Oxygen desaturations as low as 80%.  4.  Moderate snoring was present. O2 sats were < 88% for 39 min.  5.  Total sleep time was 9 hrs and 5 min.  6.  25% of total sleep time was spent in REM sleep.   7.  Normal sleep onset latency at 18 min  8.  Shortened REM sleep onset latency at 57 min.   9.  Total awakenings were 7.  10. Arrhythmia detection:  None  DIAGNOSIS:   Moderate Obstructive Sleep Apnea (G47.33) Nocturnal Hypoxemia  RECOMMENDATIONS:   1.  Clinical correlation of these findings is necessary.  The decision to treat obstructive sleep apnea (OSA) is usually based on the presence of apnea symptoms or the presence of associated medical conditions such as Hypertension, Congestive Heart Failure, Atrial Fibrillation or Obesity.  The most common symptoms of OSA are snoring, gasping for breath while sleeping, daytime sleepiness and fatigue.   2.  Initiating apnea therapy is recommended given the presence of symptoms and/or associated conditions. Recommend  proceeding with one of the following:     a.  Auto-CPAP therapy with a pressure range of 5-20cm H2O.     b.  An oral appliance (OA) that can be obtained from certain dentists with expertise in sleep medicine.  These are primarily of use in non-obese patients with mild and moderate disease.     c.  An ENT consultation which may be useful to look for specific causes of obstruction and possible treatment options.     d.  If patient is intolerant to PAP therapy, consider referral to ENT for evaluation for hypoglossal nerve stimulator.   3.  Close follow-up is necessary to ensure success with CPAP or oral appliance therapy for maximum benefit.  4.  A follow-up oximetry study on CPAP is recommended to assess the adequacy of therapy and determine the need for supplemental oxygen or the potential need for Bi-level therapy.  An arterial blood gas to determine the adequacy of baseline ventilation and oxygenation should also be considered.  5.  Healthy sleep recommendations include:  adequate nightly sleep (normal 7-9 hrs/night), avoidance of caffeine after noon and alcohol near bedtime, and maintaining a sleep environment that is cool, dark and quiet.  6.  Weight loss for overweight patients is recommended.  Even modest amounts of weight loss can significantly improve the severity of sleep apnea.  7.  Snoring recommendations include:  weight loss where appropriate, side sleeping, and avoidance of alcohol before bed.  8.  Operation of motor vehicle should not be performed when sleepy.  Signature: Fransico Him, MD; [facility];  Diplomat, Tax adviser of Sleep Medicine Electronically Signed: 08/04/22

## 2022-08-05 DIAGNOSIS — H26492 Other secondary cataract, left eye: Secondary | ICD-10-CM | POA: Diagnosis not present

## 2022-08-06 DIAGNOSIS — Z23 Encounter for immunization: Secondary | ICD-10-CM | POA: Diagnosis not present

## 2022-08-08 ENCOUNTER — Ambulatory Visit: Payer: Medicare PPO | Attending: Cardiovascular Disease

## 2022-08-08 DIAGNOSIS — R400 Somnolence: Secondary | ICD-10-CM

## 2022-08-09 ENCOUNTER — Telehealth: Payer: Self-pay | Admitting: *Deleted

## 2022-08-09 DIAGNOSIS — G4733 Obstructive sleep apnea (adult) (pediatric): Secondary | ICD-10-CM

## 2022-08-09 DIAGNOSIS — I5032 Chronic diastolic (congestive) heart failure: Secondary | ICD-10-CM

## 2022-08-09 DIAGNOSIS — I2510 Atherosclerotic heart disease of native coronary artery without angina pectoris: Secondary | ICD-10-CM

## 2022-08-09 NOTE — Telephone Encounter (Signed)
The patient has been notified of the result and verbalized understanding.  All questions (if any) were answered. Marolyn Hammock, CMA 08/09/2022 41:28 PM    Will precert Cpap titration

## 2022-08-09 NOTE — Telephone Encounter (Signed)
-----   Message from Lauralee Evener, [place] sent at 08/05/2022 10:02 AM EDT -----  ----- Message ----- From: Sueanne Margarita, MD Sent: 08/04/2022   2:11 PM EDT To: [facility] Sleep Studies  Please let patient know that they have sleep apnea.  Recommend therapeutic CPAP titration for treatment of patient's sleep disordered breathing.  If unable to perform an in lab titration then initiate ResMed auto CPAP from 4 to 15cm H2O with heated humidity and mask of choice and overnight pulse ox on CPAP.

## 2022-08-31 DIAGNOSIS — M1712 Unilateral primary osteoarthritis, left knee: Secondary | ICD-10-CM | POA: Diagnosis not present

## 2022-09-02 DIAGNOSIS — Z1231 Encounter for screening mammogram for malignant neoplasm of breast: Secondary | ICD-10-CM | POA: Diagnosis not present

## 2022-09-09 DIAGNOSIS — J069 Acute upper respiratory infection, unspecified: Secondary | ICD-10-CM | POA: Diagnosis not present

## 2022-09-09 DIAGNOSIS — J101 Influenza due to other identified influenza virus with other respiratory manifestations: Secondary | ICD-10-CM | POA: Diagnosis not present

## 2022-09-09 DIAGNOSIS — N1832 Chronic kidney disease, stage 3b: Secondary | ICD-10-CM | POA: Diagnosis not present

## 2022-09-09 DIAGNOSIS — Z1152 Encounter for screening for COVID-19: Secondary | ICD-10-CM | POA: Diagnosis not present

## 2022-09-09 DIAGNOSIS — I129 Hypertensive chronic kidney disease with stage 1 through stage 4 chronic kidney disease, or unspecified chronic kidney disease: Secondary | ICD-10-CM | POA: Diagnosis not present

## 2022-09-09 DIAGNOSIS — R0981 Nasal congestion: Secondary | ICD-10-CM | POA: Diagnosis not present

## 2022-09-09 DIAGNOSIS — J309 Allergic rhinitis, unspecified: Secondary | ICD-10-CM | POA: Diagnosis not present

## 2022-09-09 DIAGNOSIS — J029 Acute pharyngitis, unspecified: Secondary | ICD-10-CM | POA: Diagnosis not present

## 2022-09-17 ENCOUNTER — Other Ambulatory Visit: Payer: Self-pay | Admitting: Physician Assistant

## 2022-09-20 ENCOUNTER — Ambulatory Visit (HOSPITAL_BASED_OUTPATIENT_CLINIC_OR_DEPARTMENT_OTHER): Payer: Medicare PPO | Attending: Cardiology | Admitting: Cardiology

## 2022-09-20 VITALS — Ht 65.0 in | Wt 223.8 lb

## 2022-09-20 DIAGNOSIS — G4733 Obstructive sleep apnea (adult) (pediatric): Secondary | ICD-10-CM

## 2022-09-20 DIAGNOSIS — I2510 Atherosclerotic heart disease of native coronary artery without angina pectoris: Secondary | ICD-10-CM | POA: Insufficient documentation

## 2022-09-20 DIAGNOSIS — I5032 Chronic diastolic (congestive) heart failure: Secondary | ICD-10-CM | POA: Insufficient documentation

## 2022-09-26 ENCOUNTER — Emergency Department (HOSPITAL_BASED_OUTPATIENT_CLINIC_OR_DEPARTMENT_OTHER): Payer: Medicare PPO | Admitting: Radiology

## 2022-09-26 ENCOUNTER — Other Ambulatory Visit: Payer: Self-pay

## 2022-09-26 ENCOUNTER — Encounter (HOSPITAL_BASED_OUTPATIENT_CLINIC_OR_DEPARTMENT_OTHER): Payer: Self-pay | Admitting: Emergency Medicine

## 2022-09-26 ENCOUNTER — Emergency Department (HOSPITAL_BASED_OUTPATIENT_CLINIC_OR_DEPARTMENT_OTHER)
Admission: EM | Admit: 2022-09-26 | Discharge: 2022-09-26 | Disposition: A | Discharge status: Home / Self Care | Payer: Medicare PPO | Attending: Emergency Medicine | Admitting: Emergency Medicine

## 2022-09-26 ENCOUNTER — Emergency Department (HOSPITAL_BASED_OUTPATIENT_CLINIC_OR_DEPARTMENT_OTHER): Payer: Medicare PPO

## 2022-09-26 DIAGNOSIS — Z7982 Long term (current) use of aspirin: Secondary | ICD-10-CM | POA: Diagnosis not present

## 2022-09-26 DIAGNOSIS — Z79899 Other long term (current) drug therapy: Secondary | ICD-10-CM | POA: Diagnosis not present

## 2022-09-26 DIAGNOSIS — R0781 Pleurodynia: Secondary | ICD-10-CM | POA: Diagnosis not present

## 2022-09-26 DIAGNOSIS — M25552 Pain in left hip: Secondary | ICD-10-CM | POA: Insufficient documentation

## 2022-09-26 DIAGNOSIS — W010XXA Fall on same level from slipping, tripping and stumbling without subsequent striking against object, initial encounter: Secondary | ICD-10-CM | POA: Diagnosis not present

## 2022-09-26 DIAGNOSIS — I509 Heart failure, unspecified: Secondary | ICD-10-CM | POA: Diagnosis not present

## 2022-09-26 DIAGNOSIS — Y9289 Other specified places as the place of occurrence of the external cause: Secondary | ICD-10-CM | POA: Insufficient documentation

## 2022-09-26 DIAGNOSIS — I2510 Atherosclerotic heart disease of native coronary artery without angina pectoris: Secondary | ICD-10-CM | POA: Diagnosis not present

## 2022-09-26 DIAGNOSIS — M19012 Primary osteoarthritis, left shoulder: Secondary | ICD-10-CM | POA: Diagnosis not present

## 2022-09-26 DIAGNOSIS — M79602 Pain in left arm: Secondary | ICD-10-CM | POA: Diagnosis present

## 2022-09-26 DIAGNOSIS — M1612 Unilateral primary osteoarthritis, left hip: Secondary | ICD-10-CM | POA: Diagnosis not present

## 2022-09-26 DIAGNOSIS — S51812A Laceration without foreign body of left forearm, initial encounter: Secondary | ICD-10-CM | POA: Insufficient documentation

## 2022-09-26 DIAGNOSIS — M1812 Unilateral primary osteoarthritis of first carpometacarpal joint, left hand: Secondary | ICD-10-CM | POA: Diagnosis not present

## 2022-09-26 DIAGNOSIS — W19XXXA Unspecified fall, initial encounter: Secondary | ICD-10-CM

## 2022-09-26 DIAGNOSIS — S0990XA Unspecified injury of head, initial encounter: Secondary | ICD-10-CM | POA: Diagnosis not present

## 2022-09-26 DIAGNOSIS — M546 Pain in thoracic spine: Secondary | ICD-10-CM | POA: Diagnosis not present

## 2022-09-26 DIAGNOSIS — J449 Chronic obstructive pulmonary disease, unspecified: Secondary | ICD-10-CM | POA: Diagnosis not present

## 2022-09-26 DIAGNOSIS — Y9301 Activity, walking, marching and hiking: Secondary | ICD-10-CM | POA: Insufficient documentation

## 2022-09-26 DIAGNOSIS — Z96642 Presence of left artificial hip joint: Secondary | ICD-10-CM | POA: Diagnosis not present

## 2022-09-26 DIAGNOSIS — Z471 Aftercare following joint replacement surgery: Secondary | ICD-10-CM | POA: Diagnosis not present

## 2022-09-26 DIAGNOSIS — Z043 Encounter for examination and observation following other accident: Secondary | ICD-10-CM | POA: Diagnosis not present

## 2022-09-26 MED ORDER — ACETAMINOPHEN 500 MG PO TABS: 500.0000 mg | ORAL_TABLET | Freq: Once | ORAL | Status: DC

## 2022-09-26 NOTE — ED Provider Notes (Signed)
[facility]ERGENC[facility] DEPT Provider Note   CSN: [id number] Arrival date & time: 09/26/22  1343     History  Chief Complaint  Patient presents with   Donna Williamson is a 76 y.o. female.   Fall   76 year old female presents emergency department after a fall.  Patient states that she was walking at her mother's nursing facility earlier today when she tripped over a bench in the hallway landing on her left side.  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  She denies any trauma to head, blood thinner use, loss of consciousness but is complaining of mild neck pain.  She reports left-sided shoulder pain, wrist pain, rib pain and hip pain.  Denies visual disturbance, slurred speech, facial droop, weakness/sensory deficits in upper or lower extremities, abdominal pain, nausea, vomiting, shortness of breath.  Past medical history significant for COPD, NSTEMI, CAD, CHF  Home Medications Prior to Admission medications   Medication Sig Start Date End Date Taking? Authorizing Provider  albuterol (PROVENTIL) (2.5 MG/3ML) 0.083% nebulizer solution Take 3 mLs (2.5 mg total) by nebulization every 2 (two) hours as needed for wheezing or shortness of breath. 05/04/16   Hosie Poisson, MD  ALPRAZolam Duanne Moron) 0.5 MG tablet Take 0.5 mg by mouth daily as needed for anxiety.     [provider]  aspirin EC 81 MG tablet Take 81 mg by mouth daily.    [provider]  budesonide-formoterol (SYMBICORT) 160-4.5 MCG/ACT inhaler Inhale 2 puffs into the lungs 2 (two) times daily.    [provider]  Cholecalciferol (VITAMIN D3) 2000 UNITS TABS Take 2,000 Units by mouth daily.    [provider]  clobetasol ointment (TEMOVATE) 0.05 % Apply topically. 01/28/22   [provider]  cyanocobalamin 500 MCG tablet Take 1,000 mcg by mouth daily.    [provider]   diltiazem (CARDIZEM CD) 240 MG 24 hr capsule Take 1 capsule (240 mg total) by mouth daily. 03/08/21   Tommie Raymond, NP  EPIPEN 2-PAK 0.3 MG/0.3ML SOAJ injection Inject 0.3 mg into the skin daily as needed (allergic reaction).  11/01/13   [provider]  estradiol (ESTRACE) 1 MG tablet Take 1 mg by mouth daily. 03/31/22   [provider]  ezetimibe (ZETIA) 10 MG tablet Take 10 mg by mouth daily.    [provider]  FLUoxetine (PROZAC) 10 MG capsule Take 10 mg by mouth daily. 02/21/22   [provider]  fluticasone (FLONASE) 50 MCG/ACT nasal spray Place 2 sprays into the nose daily.    [provider]  furosemide (LASIX) 20 MG tablet TAKE 1 TABLET(20 MG) BY MOUTH EVERY OTHER DAY 05/13/22   Richardson Dopp T, PA-C  gabapentin (NEURONTIN) 100 MG capsule Take 100 mg by mouth at bedtime.     [provider]  isosorbide mononitrate (IMDUR) 30 MG 24 hr tablet Take 1 tablet (30 mg total) by mouth daily. 02/04/22 05/05/22  Richardson Dopp T, PA-C  metoprolol tartrate (LOPRESSOR) 25 MG tablet TAKE 1/2 TABLET(12.5 MG) BY MOUTH TWICE DAILY. 09/19/22   Sherren Mocha, MD  montelukast (SINGULAIR) 10 MG tablet Take 10 mg by mouth at bedtime.    [provider]  Multiple Vitamin (MULTIVITAMIN WITH MINERALS) TABS tablet Take 1 tablet by mouth daily.    [provider]  nitroGLYCERIN (NITROSTAT) 0.4 MG SL tablet Place 1 tablet (0.4 mg  total) under the tongue every 5 (five) minutes as needed for chest pain (up to 3 doses. If taking 3rd dose call 911). 02/04/22   Richardson Dopp T, PA-C  pantoprazole (PROTONIX) 40 MG tablet TAKE 2 TABLETS(80 MG) BY MOUTH DAILY 04/07/22   Sherren Mocha, MD  Polyvinyl Alcohol (LIQUID TEARS OP) Place 1 drop into both eyes daily as needed (dry eyes).    [provider]  potassium chloride (KLOR-CON) 10 MEQ tablet Take 10 mEq by mouth daily. 05/09/22   [provider]  PROAIR HFA 108 (90 BASE) MCG/ACT inhaler Inhale  1-2 puffs into the lungs every 4 (four) hours as needed for wheezing. 11/01/13   [provider]  rosuvastatin (CRESTOR) 20 MG tablet Take 1 tablet (20 mg total) by mouth daily at 6 PM. 05/30/16   Bhagat, Bhavinkumar, PA  VALTREX 1 g tablet Take 1,000 mg by mouth 3 (three) times daily. 04/01/22   [provider]      Allergies    Levaquin [levofloxacin], Plavix [clopidogrel bisulfate], Ciprofibrate, Codeine, Drug ingredient [ticagrelor], Nitrofurantoin, Ciprofloxacin, Penicillins, and Sulfa antibiotics    Review of Systems   Review of Systems  All other systems reviewed and are negative.   Physical Exam Updated Vital Signs BP 138/65   Pulse 70   Temp 97.6 F (36.4 C)   Resp 20   SpO2 96%  Physical Exam Vitals and nursing note reviewed.  Constitutional:      General: She is not in acute distress.    Appearance: She is well-developed.  HENT:     Head: Normocephalic and atraumatic.  Eyes:     Conjunctiva/sclera: Conjunctivae normal.  Cardiovascular:     Rate and Rhythm: Normal rate and regular rhythm.     Heart sounds: No murmur heard. Pulmonary:     Effort: Pulmonary effort is normal. No respiratory distress.     Breath sounds: Normal breath sounds. No wheezing or rales.  Abdominal:     Palpations: Abdomen is soft.     Tenderness: There is no abdominal tenderness.  Musculoskeletal:        General: No swelling.     Cervical back: Neck supple.     Comments: Midline tenderness of lower cervical spine with no obvious step-off or deformity noted.  Paraspinal tenderness in the cervical region on the left side with radiation down left trapezial ridge.  No overlying skin abnormalities noted.    Patient has full active range of motion of bilateral upper extremities at shoulder, elbow, wrist, digits.  Muscle strength 5 out of 5 for upper and lower extremities.  No sensory deficits along major nerve distributions of upper and lower extremities.  Radial pulses full intact  bilaterally.  No bony tenderness of left upper extremity besides lateral left wrist.  No anatomical snuffbox tenderness.  Small skin tear noted on dorsal aspect of patient's left forearm measuring approximately 2.0 cm in length and linear in appearance.  No obvious foreign body noted.  2 small punctate lacerations noted adjacently.    Mild tenderness to palpation of left proximal femur.  Patient able to ambulate without difficulty.  Strength symmetric bilateral lower extremities.  Pedal pulses full and intact bilaterally.  Patient has tenderness to palpation of left lateral lower ribs with no obvious step-off or deformity.  No palpable crepitus.  No overlying skin abnormalities noted.  Skin:    General: Skin is warm and dry.     Capillary Refill: Capillary refill takes less than 2 seconds.  Neurological:     Mental Status: She is alert.     Comments: Alert and oriented to self, place, time and event.   Speech is fluent, clear without dysarthria or dysphasia.   Strength symmetric in upper/lower extremities   Sensation intact in upper/lower extremities   Normal gait.  Negative Romberg. No pronator drift.  Normal finger-to-nose and feet tapping.  CN I not tested  CN II grossly intact visual fields bilaterally. Did not visualize posterior eye.  CN III, IV, VI PERRLA and EOMs intact bilaterally  CN V Intact sensation to sharp and light touch to the face  CN VII facial movements symmetric  CN VIII not tested  CN IX, X no uvula deviation, symmetric rise of soft palate  CN XI 5/5 SCM and trapezius strength bilaterally  CN XII Midline tongue protrusion, symmetric L/R movements     Psychiatric:        Mood and Affect: Mood normal.     ED Results / Procedures / Treatments   Labs (all labs ordered are listed, but only abnormal results are displayed) Labs Reviewed - No data to display  EKG None  Radiology CT Cervical Spine Wo Contrast  Result Date: 09/26/2022 CLINICAL DATA:  Fall  EXAM: CT HEAD WITHOUT CONTRAST CT CERVICAL SPINE WITHOUT CONTRAST TECHNIQUE: Multidetector CT imaging of the head and cervical spine was performed following the standard protocol without intravenous contrast. Multiplanar CT image reconstructions of the cervical spine were also generated. RADIATION DOSE REDUCTION: This exam was performed according to the departmental dose-optimization program which includes automated exposure control, adjustment of the mA and/or kV according to patient size and/or use of iterative reconstruction technique. COMPARISON:  08/01/2017 FINDINGS: CT HEAD FINDINGS Brain: There is no mass, hemorrhage or extra-axial collection. The size and configuration of the ventricles and extra-axial CSF spaces are normal. There is hypoattenuation of the periventricular white matter, most commonly indicating chronic ischemic microangiopathy. Vascular: No abnormal hyperdensity of the major intracranial arteries or dural venous sinuses. No intracranial atherosclerosis. Skull: The visualized skull base, calvarium and extracranial soft tissues are normal. Sinuses/Orbits: No fluid levels or advanced mucosal thickening of the visualized paranasal sinuses. No mastoid or middle ear effusion. The orbits are normal. CT CERVICAL SPINE FINDINGS Alignment: There is grade 1 anterolisthesis at C3-4 and grade 1 retrolisthesis at C4-5. Skull base and vertebrae: No acute fracture. Soft tissues and spinal canal: No prevertebral fluid or swelling. No visible canal hematoma. Disc levels: There is left foraminal stenosis at C4-5 and C5-6. Upper chest: No pneumothorax, pulmonary nodule or pleural effusion. Other: Normal visualized paraspinal cervical soft tissues. IMPRESSION: 1. No acute intracranial abnormality. 2. No acute fracture of the cervical spine. 3. 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 Electronically Signed   By: Ulyses Jarred M.D.   On: 09/26/2022 18:56   CT Head Wo Contrast  Result Date: 09/26/2022 CLINICAL  DATA:  Fall EXAM: CT HEAD WITHOUT CONTRAST CT CERVICAL SPINE WITHOUT CONTRAST TECHNIQUE: Multidetector CT imaging of the head and cervical spine was performed following the standard protocol without intravenous contrast. Multiplanar CT image reconstructions of the cervical spine were also generated. RADIATION DOSE REDUCTION: This exam was performed according to the departmental dose-optimization program which includes automated exposure control, adjustment of the mA and/or kV according to patient size and/or use of iterative reconstruction technique. COMPARISON:  08/01/2017 FINDINGS: CT HEAD FINDINGS Brain: There is no mass, hemorrhage or extra-axial collection. The size and configuration of the ventricles and extra-axial CSF spaces are  normal. There is hypoattenuation of the periventricular white matter, most commonly indicating chronic ischemic microangiopathy. Vascular: No abnormal hyperdensity of the major intracranial arteries or dural venous sinuses. No intracranial atherosclerosis. Skull: The visualized skull base, calvarium and extracranial soft tissues are normal. Sinuses/Orbits: No fluid levels or advanced mucosal thickening of the visualized paranasal sinuses. No mastoid or middle ear effusion. The orbits are normal. CT CERVICAL SPINE FINDINGS Alignment: There is grade 1 anterolisthesis at C3-4 and grade 1 retrolisthesis at C4-5. Skull base and vertebrae: No acute fracture. Soft tissues and spinal canal: No prevertebral fluid or swelling. No visible canal hematoma. Disc levels: There is left foraminal stenosis at C4-5 and C5-6. Upper chest: No pneumothorax, pulmonary nodule or pleural effusion. Other: Normal visualized paraspinal cervical soft tissues. IMPRESSION: 1. No acute intracranial abnormality. 2. No acute fracture of the cervical spine. 3. 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 Electronically Signed   By: Ulyses Jarred M.D.   On: 09/26/2022 18:56   DG Hip Unilat W or Wo Pelvis 2-3 Views  Left  Result Date: 09/26/2022 CLINICAL DATA:  Pain.  Fall today. EXAM: DG HIP (WITH OR WITHOUT PELVIS) 2-3V LEFT COMPARISON:  Pelvis and left hip radiographs 12/19/2005 FINDINGS: There is diffuse decreased bone mineralization. Mild right femoroacetabular joint space narrowing. Mild bilateral sacroiliac subchondral sclerosis. Redemonstration of total left hip arthroplasty. No perihardware lucency is seen to indicate hardware failure or loosening. There are multiple calcific versus ossific densities lateral to the left ilium and left hip, chronic and possibly postsurgical. No acute fracture is seen. No dislocation. IMPRESSION: Total left hip arthroplasty without evidence of hardware failure. Mild right femoroacetabular osteoarthritis. No acute fracture. Electronically Signed   By: Yvonne Kendall M.D.   On: 09/26/2022 15:14   DG Ribs Unilateral W/Chest Left  Result Date: 09/26/2022 CLINICAL DATA:  Fall.  Pain. EXAM: LEFT RIBS AND CHEST - 3+ VIEW COMPARISON:  Chest two views 12/11/2021 FINDINGS: Cardiac silhouette and mediastinal contours are within normal limits. Mild calcification within the aortic arch. Mild interstitial thickening and some linear streaky densities that likely reflect chronic scarring, unchanged from 12/11/2021. No focal airspace opacity to indicate pneumonia. No pleural effusion or pneumothorax. A marker overlies the anterolateral left flank at the level of the lateral left tenth and eleventh ribs. No definite acute displaced left-sided rib fracture is seen. Mild levocurvature of the mid to lower thoracic spine. IMPRESSION: 1. No definite acute displaced left-sided rib fracture. No pneumothorax is seen. 2. Mild interstitial thickening and some linear streaky densities that likely reflect chronic scarring, unchanged from 12/11/2021. Electronically Signed   By: Yvonne Kendall M.D.   On: 09/26/2022 15:12   DG Shoulder Left  Result Date: 09/26/2022 CLINICAL DATA:  Fall.  Pain. EXAM: LEFT  SHOULDER - 2+ VIEW COMPARISON:  None Available. FINDINGS: Mildly decreased bone mineralization. Mild peripheral acromioclavicular degenerative osteophytes. The glenohumeral joint space is maintained. No acute fracture or dislocation. The visualized portion of the left lung is unremarkable. Mild-to-moderate atherosclerotic calcifications within the aortic arch. IMPRESSION: Mild acromioclavicular osteoarthritis. No acute fracture. Electronically Signed   By: Yvonne Kendall M.D.   On: 09/26/2022 15:03   DG Wrist Complete Left  Result Date: 09/26/2022 CLINICAL DATA:  Fall today, pain, initial encounter. EXAM: LEFT WRIST - COMPLETE 3+ VIEW COMPARISON:  None Available. FINDINGS: No acute osseous or joint abnormality. Bony overgrowth and degenerative changes at the first carpometacarpal joint. IMPRESSION: 1. No acute osseous abnormality question no acute findings. 2. First carpometacarpal joint  osteoarthritis. Electronically Signed   By: Lorin Picket M.D.   On: 09/26/2022 15:03    Procedures .Marland KitchenLaceration Repair  Date/Time: 09/26/2022 7:40 PM  Performed by: Wilnette Kales, PA Authorized by: Wilnette Kales, PA   Consent:    Consent obtained:  Verbal   Consent given by:  Patient   Risks, benefits, and alternatives were discussed: yes     Risks discussed:  Need for additional repair, infection, poor cosmetic result, nerve damage, poor wound healing, vascular damage, tendon damage, retained foreign body and pain   Alternatives discussed:  No treatment and delayed treatment Universal protocol:    Procedure explained and questions answered to patient or proxy's satisfaction: yes     Patient identity confirmed:  Verbally with patient Anesthesia:    Anesthesia method:  None Laceration details:    Location:  Shoulder/arm   Shoulder/arm location:  L lower arm   Length (cm):  2 Pre-procedure details:    Preparation:  Patient was prepped and draped in usual sterile fashion Exploration:    Limited  defect created (wound extended): no     Imaging outcome: foreign body not noted     Wound exploration: wound explored through full range of motion and entire depth of wound visualized     Contaminated: no   Treatment:    Area cleansed with:  Saline   Amount of cleaning:  Standard   Irrigation method:  Syringe   Debridement:  None   Undermining:  None   Scar revision: no   Skin repair:    Repair method:  Tissue adhesive Approximation:    Approximation:  Close Repair type:    Repair type:  Simple Post-procedure details:    Dressing:  Open (no dressing)   Procedure completion:  Tolerated well, no immediate complications Comments:     Patient had small skin tear that was repaired via Dermabond after 250 cc flushing of saline.     Medications Ordered in ED Medications  acetaminophen (TYLENOL) tablet 500 mg (500 mg Oral Patient Refused/Not Given 09/26/22 1916)    ED Course/ Medical Decision Making/ A&P                           Medical Decision Making Amount and/or Complexity of Data Reviewed Radiology: ordered.  Risk OTC drugs.   This patient presents to the ED for concern of fall, this involves an extensive number of treatment options, and is a complaint that carries with it a high risk of complications and morbidity.  The differential diagnosis includes CVA, fracture, strain/sprain, dislocation, solid organ damage, pneumothorax   Co morbidities that complicate the patient evaluation  See HPI   Additional history obtained:  Additional history obtained from EMR External records from outside source obtained and reviewed including hospital records   Lab Tests:  N/a   Imaging Studies ordered:  I ordered imaging studies including chest x-ray with left ribs, left shoulder x-ray, left wrist x-ray, pelvis with left hip, CT C-spine, CT head I independently visualized and interpreted imaging which showed  CT head/C-spine: No acute intracranial abnormality.  No acute  fracture of the C-spine.  Left foraminal stenosis at C4-C5 and C5-C6. Left wrist x-ray: No acute osseous abnormality.  First carpometacarpal joint osteoarthritis. Pelvis with left hip: Total left hip arthroplasty without evidence of hardware failure.  Mild bilateral femoral acetabular osteoarthritis.  No acute fracture. Left shoulder x-ray: Mild AC OA.  No acute fracture. Chest x-ray  with left ribs: No definite acute displaced left-sided rib fracture.  No pneumothorax seen.  Mild interstitial thickening and some linear streaky densities that remain unchanged from 12/11/2021 I agree with the radiologist interpretation  Cardiac Monitoring: / EKG:  The patient was maintained on a cardiac monitor.  I personally viewed and interpreted the cardiac monitored which showed an underlying rhythm of: Sinus rhythm   Consultations Obtained:  N/a   Problem List / ED Course / Critical interventions / Medication management  Fall I ordered medication including Tylenol for pain   Reevaluation of the patient after these medicines showed that the patient improved I have reviewed the patients home medicines and have made adjustments as needed   Social Determinants of Health:  Denies tobacco, illicit drug use   Test / Admission - Considered:  Fall Vitals signs within normal range and stable throughout visit. Imaging studies significant for: See above Patient reassured by overall negative work-up today.  Skin tear repaired in manner as pictured above.  Recommend at home therapy with Tylenol/ice as needed for areas of pain.  Recommend follow-up with primary care in 3 to 5 days for reevaluation of symptoms.  Treatment plan discussed at length with patient she knowledge understanding was agreeable to said plan. Worrisome signs and symptoms were discussed with the patient, and the patient acknowledged understanding to return to the ED if noticed. Patient was stable upon discharge.          Final  Clinical Impression(s) / ED Diagnoses Final diagnoses:  Fall, initial encounter    Rx / DC Orders ED Discharge Orders     None         Wilnette Kales, [place] 09/26/22 1941    Elgie Congo, MD 09/27/22 1233

## 2022-09-26 NOTE — Procedures (Signed)
   Patient Name: Donna Williamson, Donna Williamson Date: 09/20/2022 Gender: Female D.O.B: 13-Aug-1946 Age (years): 35 Referring Provider: Fransico Him MD, ABSM Height (inches): 65 Interpreting Physician: Fransico Him MD, ABSM Weight (lbs): 224 RPSGT: Jorge Ny BMI: 37 MRN: [id number] Neck Size: 14.50  CLINICAL INFORMATION The patient is referred for a CPAP titration to treat sleep apnea.  SLEEP STUDY TECHNIQUE As per the AASM Manual for the Scoring of Sleep and Associated Events v2.3 (April 2016) with a hypopnea requiring 4% desaturations.  The channels recorded and monitored were frontal, central and occipital EEG, electrooculogram (EOG), submentalis EMG (chin), nasal and oral airflow, thoracic and abdominal wall motion, anterior tibialis EMG, snore microphone, electrocardiogram, and pulse oximetry. Continuous positive airway pressure (CPAP) was initiated at the beginning of the study and titrated to treat sleep-disordered breathing.  MEDICATIONS Medications self-administered by patient taken the night of the study : EZETIMIBE, GABAPENTIN, METOPROLOL TARTRATE, MONTELUKAST, Rosuvastatin  TECHNICIAN COMMENTS Comments added by technician: Patient had difficulty initiating sleep. Comments added by scorer: N/A  RESPIRATORY PARAMETERS Optimal PAP Pressure (cm): 10  AHI at Optimal Pressure (/hr):0.9 Overall Minimal O2 (%):81.0  Supine % at Optimal Pressure (%):N/A Minimal O2 at Optimal Pressure (%): 88.0   SLEEP ARCHITECTURE The study was initiated at 10:53:00 PM and ended at 5:00:41 AM.  Sleep onset time was 38.2 minutes and the sleep efficiency was 81.9%. The total sleep time was 301 minutes.  The patient spent 3.8% of the night in stage N1 sleep, 87.9% in stage N2 sleep, 0.0% in stage N3 and 8.3% in REM.Stage REM latency was 198.5 minutes  Wake after sleep onset was 28.5. Alpha intrusion was absent. Supine sleep was 34.95%.  CARDIAC DATA The 2 lead EKG demonstrated sinus rhythm.  The mean heart rate was 58.1 beats per minute. Other EKG findings include: Rare PVC.  LEG MOVEMENT DATA The total Periodic Limb Movements of Sleep (PLMS) were 0. The PLMS index was 0.0. A PLMS index of <15 is considered normal in adults.  IMPRESSIONS - An optimal PAP pressure could not be selected for this patient based on the available study data. - Central sleep apnea was not noted during this titration (CAI = 0.2/h). - Moderate oxygen desaturations were observed during this titration (min O2 = 81.0%). - The patient snored with soft snoring volume during this titration study. - Rare PVCs were noted - Clinically significant periodic limb movements were not noted during this study. Arousals associated with PLMs were rare.  DIAGNOSIS - Obstructive Sleep Apnea (G47.33) - Nocturnal Hypoxemia  RECOMMENDATIONS - Recommend a trial of CPAP at 10cm H2O with heated humidity and mask of choice. - Avoid alcohol, sedatives and other CNS depressants that may worsen sleep apnea and disrupt normal sleep architecture. - Sleep hygiene should be reviewed to assess factors that may improve sleep quality. - Weight management and regular exercise should be initiated or continued. - Return to Sleep Center for re-evaluation after 6 weeks of therapy  [Electronically signed] 09/26/2022 01:04 PM  Fransico Him MD, ABSM Diplomate, American Board of Sleep Medicine

## 2022-09-26 NOTE — Discharge Instructions (Signed)
Note that your visit to the emergency department today was overall reassuring.  Recommend follow-up with your primary care in 3 to 5 days.  Take Tylenol as needed for pain.  Please not hesitate to return to emergency department for worrisome signs and symptoms we discussed become apparent.

## 2022-09-26 NOTE — ED Triage Notes (Signed)
Pt arrives to ED with c/o fall. She tripped over a bench in a hallway. She reports left sided arm, hip, and rib pain. No head/neck injury.

## 2022-09-29 ENCOUNTER — Telehealth: Payer: Self-pay | Admitting: *Deleted

## 2022-09-29 NOTE — Telephone Encounter (Signed)
-----   Message from Lauralee Evener, [place] sent at 09/26/2022  1:51 PM EST -----  ----- Message ----- From: Sueanne Margarita, MD Sent: 09/26/2022   1:05 PM EST To: [facility] Sleep Studies  Please let patient know that they had a successful PAP titration and let DME know that orders are in EPIC.  Please set up 6 week OV with me.

## 2022-09-29 NOTE — Telephone Encounter (Signed)
The patient has been notified of the result and verbalized understanding.  All questions (if any) were answered. Donna Williamson, [place] 09/29/2022 4:45 PM    The patient has declined to get the cpap stating the air blows around the mask and dries her eyes out and she already has a dry eye problem. Patient would like another option to the cpap mask.

## 2022-10-04 DIAGNOSIS — T148XXA Other injury of unspecified body region, initial encounter: Secondary | ICD-10-CM | POA: Diagnosis not present

## 2022-10-04 DIAGNOSIS — W010XXA Fall on same level from slipping, tripping and stumbling without subsequent striking against object, initial encounter: Secondary | ICD-10-CM | POA: Diagnosis not present

## 2022-10-04 DIAGNOSIS — S40812A Abrasion of left upper arm, initial encounter: Secondary | ICD-10-CM | POA: Diagnosis not present

## 2022-10-05 ENCOUNTER — Telehealth: Payer: Self-pay | Admitting: *Deleted

## 2022-10-05 NOTE — Telephone Encounter (Signed)
     Patient  visit on 09/27/2022  at [facility]  was for fall  Have you been able to follow up with your primary care physician? Patient went for a followup with PCP and is doing well and is feeling much better .  The patient was or was not able to obtain any needed medicine or equipment.  Are there diet recommendations that you are having difficulty following?  Patient expresses understanding of discharge instructions and education provided has no other needs at this time.   [facility] [phone] [address] , [place] [address] Email : [email or web address]

## 2022-10-10 ENCOUNTER — Telehealth: Payer: Self-pay | Admitting: Cardiology

## 2022-10-10 NOTE — Telephone Encounter (Signed)
Patient stated she is following-up on Sleep Test results as well as next steps.

## 2022-10-11 NOTE — Telephone Encounter (Signed)
Patient has decided try the cpap.  The patient has been notified of the result and verbalized understanding.  All questions (if any) were answered. Marolyn Hammock, [place] 10/11/2022 2:51 PM    Upon patient request DME selection is [facility]. Patient understands he will be contacted by [facility] to set up his cpap. Patient understands to call if [facility] does not contact him with new setup in a timely manner. Patient understands they will be called once confirmation has been received from [facility]/ that they have received their new machine to schedule 10 week follow up appointment.   [facility] notified of new cpap order  Please add to airview Patient was grateful for the call and thanked me.

## 2022-10-12 NOTE — Telephone Encounter (Signed)
The patient has been notified of the result and verbalized understanding.  All questions (if any) were answered. Marolyn Hammock, [place] 10/11/2022 2:51 PM

## 2022-11-14 DIAGNOSIS — N952 Postmenopausal atrophic vaginitis: Secondary | ICD-10-CM | POA: Diagnosis not present

## 2022-11-14 DIAGNOSIS — F419 Anxiety disorder, unspecified: Secondary | ICD-10-CM | POA: Diagnosis not present

## 2022-11-14 DIAGNOSIS — N1832 Chronic kidney disease, stage 3b: Secondary | ICD-10-CM | POA: Diagnosis not present

## 2022-11-14 DIAGNOSIS — Z6838 Body mass index (BMI) 38.0-38.9, adult: Secondary | ICD-10-CM | POA: Diagnosis not present

## 2022-11-14 DIAGNOSIS — E785 Hyperlipidemia, unspecified: Secondary | ICD-10-CM | POA: Diagnosis not present

## 2022-11-14 DIAGNOSIS — N62 Hypertrophy of breast: Secondary | ICD-10-CM | POA: Diagnosis not present

## 2022-11-14 DIAGNOSIS — I10 Essential (primary) hypertension: Secondary | ICD-10-CM | POA: Diagnosis not present

## 2022-11-14 DIAGNOSIS — R7989 Other specified abnormal findings of blood chemistry: Secondary | ICD-10-CM | POA: Diagnosis not present

## 2022-11-14 DIAGNOSIS — Z01419 Encounter for gynecological examination (general) (routine) without abnormal findings: Secondary | ICD-10-CM | POA: Diagnosis not present

## 2022-11-14 DIAGNOSIS — R7301 Impaired fasting glucose: Secondary | ICD-10-CM | POA: Diagnosis not present

## 2022-11-17 DIAGNOSIS — I2510 Atherosclerotic heart disease of native coronary artery without angina pectoris: Secondary | ICD-10-CM | POA: Diagnosis not present

## 2022-11-17 DIAGNOSIS — R9431 Abnormal electrocardiogram [ECG] [EKG]: Secondary | ICD-10-CM | POA: Diagnosis not present

## 2022-11-17 DIAGNOSIS — I8310 Varicose veins of unspecified lower extremity with inflammation: Secondary | ICD-10-CM | POA: Diagnosis not present

## 2022-11-17 DIAGNOSIS — E785 Hyperlipidemia, unspecified: Secondary | ICD-10-CM | POA: Diagnosis not present

## 2022-11-17 DIAGNOSIS — N1832 Chronic kidney disease, stage 3b: Secondary | ICD-10-CM | POA: Diagnosis not present

## 2022-11-17 DIAGNOSIS — R82998 Other abnormal findings in urine: Secondary | ICD-10-CM | POA: Diagnosis not present

## 2022-11-17 DIAGNOSIS — Z0000 Encounter for general adult medical examination without abnormal findings: Secondary | ICD-10-CM | POA: Diagnosis not present

## 2022-11-17 DIAGNOSIS — R7301 Impaired fasting glucose: Secondary | ICD-10-CM | POA: Diagnosis not present

## 2022-11-17 DIAGNOSIS — D649 Anemia, unspecified: Secondary | ICD-10-CM | POA: Diagnosis not present

## 2022-11-17 DIAGNOSIS — I129 Hypertensive chronic kidney disease with stage 1 through stage 4 chronic kidney disease, or unspecified chronic kidney disease: Secondary | ICD-10-CM | POA: Diagnosis not present

## 2022-11-23 ENCOUNTER — Telehealth: Payer: Self-pay | Admitting: *Deleted

## 2022-11-23 DIAGNOSIS — M25562 Pain in left knee: Secondary | ICD-10-CM | POA: Diagnosis not present

## 2022-11-23 DIAGNOSIS — M1712 Unilateral primary osteoarthritis, left knee: Secondary | ICD-10-CM | POA: Diagnosis not present

## 2022-11-23 DIAGNOSIS — G8929 Other chronic pain: Secondary | ICD-10-CM | POA: Diagnosis not present

## 2022-11-23 NOTE — Telephone Encounter (Signed)
   Pre-operative Risk Assessment    Patient Name: Donna Williamson  DOB: 02/07/1946 MRN: [id number]      Request for Surgical Clearance    Procedure:   TKA (TOTAL KNEE ARTHROPLASTY)  Date of Surgery:  Clearance TBD                                 Surgeon:  DR. Lara Mulch Surgeon's Group or Practice Name:  [facility] Phone number:  [phone] Fax number:  [phone] (OFFICE FAX); [phone] (SURG FAX)   Type of Clearance Requested:   - Medical ; ASA    Type of Anesthesia:  Spinal   Additional requests/questions:    Jiles Prows   11/23/2022, 5:24 PM

## 2022-11-24 NOTE — Telephone Encounter (Signed)
Left message for the pt to call back to schedule a tele pre op appt 

## 2022-11-24 NOTE — Telephone Encounter (Signed)
   Name: Donna Williamson  DOB: [birth year]  MRN: [id number]  Primary Cardiologist: Sherren Mocha, MD  Chart reviewed as part of pre-operative protocol coverage. Because of Donna Williamson's past medical history and time since last visit, she will require a follow-up telephone visit in order to better assess preoperative cardiovascular risk.  Pre-op covering staff: - Please schedule appointment and call patient to inform them. If patient already had an upcoming appointment within acceptable timeframe, please add "pre-op clearance" to the appointment notes so provider is aware. - Please contact requesting surgeon's office via preferred method (i.e, phone, fax) to inform them of need for appointment prior to surgery.  Okay to hold ASA x 5-7 days for history of LAD (mid) stenting 2022. Please resume when medically safe to do so.   Donna Collard, PA-C  11/24/2022, 9:20 AM

## 2022-11-28 ENCOUNTER — Telehealth: Payer: Self-pay

## 2022-11-28 NOTE — Telephone Encounter (Addendum)
Patient is scheduled for a telephone pre-op visit on 12/25/22 at 2:00 PM. Medications reviewed and consent given and charted. Patient verbalized understanding that provider will give her a call and if she may need to reschedule to please give the pre-op callback pool a call to reschedule appointment.

## 2022-11-28 NOTE — Telephone Encounter (Signed)
Pt is returning call. Requesting return call.

## 2022-11-28 NOTE — Telephone Encounter (Signed)
Left message x 2 to call for tele pre op appt  

## 2022-11-28 NOTE — Telephone Encounter (Signed)
  Patient Consent for Virtual Visit        Donna Williamson has provided verbal consent on 11/28/2022 for a virtual visit (video or telephone).   CONSENT FOR VIRTUAL VISIT FOR:  Donna Williamson  By participating in this virtual visit I agree to the following:  I hereby voluntarily request, consent and authorize [facility] and its employed or contracted physicians, physician assistants, nurse practitioners or other licensed health care professionals (the Practitioner), to provide me with telemedicine health care services (the "Services") as deemed necessary by the treating Practitioner. I acknowledge and consent to receive the Services by the Practitioner via telemedicine. I understand that the telemedicine visit will involve communicating with the Practitioner through live audiovisual communication technology and the disclosure of certain medical information by electronic transmission. I acknowledge that I have been given the opportunity to request an in-person assessment or other available alternative prior to the telemedicine visit and am voluntarily participating in the telemedicine visit.  I understand that I have the right to withhold or withdraw my consent to the use of telemedicine in the course of my care at any time, without affecting my right to future care or treatment, and that the Practitioner or I may terminate the telemedicine visit at any time. I understand that I have the right to inspect all information obtained and/or recorded in the course of the telemedicine visit and may receive copies of available information for a reasonable fee.  I understand that some of the potential risks of receiving the Services via telemedicine include:  Delay or interruption in medical evaluation due to technological equipment failure or disruption; Information transmitted may not be sufficient (e.g. poor resolution of images) to allow for appropriate medical decision making by the  Practitioner; and/or  In rare instances, security protocols could fail, causing a breach of personal health information.  Furthermore, I acknowledge that it is my responsibility to provide information about my medical history, conditions and care that is complete and accurate to the best of my ability. I acknowledge that Practitioner's advice, recommendations, and/or decision may be based on factors not within their control, such as incomplete or inaccurate data provided by me or distortions of diagnostic images or specimens that may result from electronic transmissions. I understand that the practice of medicine is not an exact science and that Practitioner makes no warranties or guarantees regarding treatment outcomes. I acknowledge that a copy of this consent can be made available to me via my patient portal ([facility]), or I can request a printed copy by calling the office of [facility].    I understand that my insurance will be billed for this visit.   I have read or had this consent read to me. I understand the contents of this consent, which adequately explains the benefits and risks of the Services being provided via telemedicine.  I have been provided ample opportunity to ask questions regarding this consent and the Services and have had my questions answered to my satisfaction. I give my informed consent for the services to be provided through the use of telemedicine in my medical care

## 2022-12-05 ENCOUNTER — Ambulatory Visit: Payer: Medicare PPO | Attending: Cardiology | Admitting: Nurse Practitioner

## 2022-12-05 DIAGNOSIS — Z01810 Encounter for preprocedural cardiovascular examination: Secondary | ICD-10-CM | POA: Diagnosis not present

## 2022-12-05 NOTE — Progress Notes (Signed)
Virtual Visit via Telephone Note   Because of Donna Williamson's co-morbid illnesses, she is at least at moderate risk for complications without adequate follow up.  This format is felt to be most appropriate for this patient at this time.  The patient did not have access to video technology/had technical difficulties with video requiring transitioning to audio format only (telephone).  All issues noted in this document were discussed and addressed.  No physical exam could be performed with this format.  Please refer to the patient's chart for her consent to telehealth for [facility].  Evaluation Performed:  Preoperative cardiovascular risk assessment _____________   Date:  12/05/2022   Patient ID:  Donna, Williamson April 21, 1946, MRN [id number] Patient Location:  Home Provider location:   Office  Primary Care Provider:  Crist Infante, MD Primary Cardiologist:  Sherren Mocha, MD  Chief Complaint / Patient Profile   77 y.o. y/o female with a h/o CAD s/p NSTEMI, DES-OM1 in 2017, DES-LAD, POBA-D2 in 07/3715, chronic diastolic heart failure, hypertension, hyperlipidemia, CKD, COPD, GERD, Sjogren's syndrome, and OSA who is pending L total knee arthroplasty with Dr. Lara Mulch of sports medicine and joint replacement and presents today for telephonic preoperative cardiovascular risk assessment.  History of Present Illness    Donna Williamson is a 77 y.o. female who presents via audio/video conferencing for a telehealth visit today.  Pt was last seen in cardiology clinic on 08/01/2022 by Dr. Burt Knack.  At that time Donna Williamson was doing well.  The patient is now pending procedure as outlined above. Since her last visit, she has done well from a cardiac standpoint.   She denies chest pain, palpitations, dyspnea, pnd, orthopnea, n, v, dizziness, syncope, edema, weight gain, or early satiety. All other systems reviewed and are otherwise negative except as noted above.   Past  Medical History    Past Medical History:  Diagnosis Date   Anemia    Anginal pain ([facility])    Anxiety    Arthritis    "feet, knees, back" (05/02/2016)   Asthma    CAD (coronary artery disease)    a. 04/2016: NSTEMI 95% stenosis 1st Mrg (s/p DES)   Chest pain    a. 10/2013 Exercise Myoview: Ef 65%, no ischemia.   CHF (congestive heart failure) (HCC)    Chronic bronchitis (HCC)    Chronic lower back pain    COPD (chronic obstructive pulmonary disease) (HCC)    GERD (gastroesophageal reflux disease)    History of blood transfusion 1960s   "related to my hysterectomy"   Hypercholesterolemia    Hypertension    Lower extremity edema    Mitral valve prolapse    NSTEMI (non-ST elevated myocardial infarction) ([place] [facility]) 05/01/2016   Pneumonia 05/01/2016   "saw trace of slight pneumonia/CT scan" (05/02/2016)   Seasonal allergies    Sjogren's syndrome ([facility])    Wheezing    Past Surgical History:  Procedure Laterality Date   ABDOMINAL HYSTERECTOMY  1960s   "partial"   CARDIAC CATHETERIZATION N/A 05/02/2016   Procedure: Right/Left Heart Cath and Coronary Angiography;  Surgeon: Jettie Booze, MD;  Location: [facility];  Service: Cardiovascular;  Laterality: N/A;   CARDIAC CATHETERIZATION N/A 05/02/2016   Procedure: Coronary Stent Intervention;  Surgeon: Jettie Booze, MD;  Location: [facility];  Service: Cardiovascular;  Laterality: N/A;   CATARACT EXTRACTION W/PHACO Right 09/20/2019   Procedure: CATARACT EXTRACTION PHACO AND INTRAOCULAR LENS PLACEMENT (IOC) RIGHT;  Surgeon: Birder Robson, MD;  Location: [facility];  Service: Ophthalmology;  Laterality: Right;  [place] 01:29.4 CDE 19.28 FLUID PACK LOT # [id number]   CATARACT EXTRACTION W/PHACO Left 10/15/2019   Procedure: CATARACT EXTRACTION PHACO AND INTRAOCULAR LENS PLACEMENT (IOC) LEFT 8.76, 00:48.8;  Surgeon: Birder Robson, MD;  Location: [facility];  Service: Ophthalmology;  Laterality: Left;   COLONOSCOPY W/  BIOPSIES AND POLYPECTOMY     CORONARY ANGIOPLASTY WITH STENT PLACEMENT  05/02/2016   "1 stent"   CORONARY STENT INTERVENTION N/A 02/17/2021   Procedure: CORONARY STENT INTERVENTION;  Surgeon: Burnell Blanks, MD;  Location: [facility];  Service: Cardiovascular;  Laterality: N/A;   ESOPHAGOGASTRODUODENOSCOPY     INTRAVASCULAR PRESSURE WIRE/FFR STUDY N/A 02/17/2021   Procedure: INTRAVASCULAR PRESSURE WIRE/FFR STUDY;  Surgeon: Burnell Blanks, MD;  Location: [facility];  Service: Cardiovascular;  Laterality: N/A;   JOINT REPLACEMENT Left 2007   hip   LAPAROSCOPIC SALPINGOOPHERECTOMY Bilateral ~ 1990   LEFT HEART CATH AND CORONARY ANGIOGRAPHY N/A 02/17/2021   Procedure: LEFT HEART CATH AND CORONARY ANGIOGRAPHY;  Surgeon: Burnell Blanks, MD;  Location: [facility];  Service: Cardiovascular;  Laterality: N/A;   TOTAL HIP ARTHROPLASTY Left 2007    Allergies  Allergies  Allergen Reactions   Levaquin [Levofloxacin] Anaphylaxis   Plavix [Clopidogrel Bisulfate] Rash   Ciprofibrate     Other reaction(s): swelling, muscle aches   Codeine Nausea Only   Drug Ingredient [Ticagrelor] Other (See Comments)    Patient reports hemorrhage after medical trial of brilinta in 2017   Nitrofurantoin Other (See Comments)    unknown VISION CHANGES   Ciprofloxacin Rash   Penicillins Rash    Has patient had a PCN reaction causing immediate rash, facial/tongue/throat swelling, SOB or lightheadedness with hypotension: No Has patient had a PCN reaction causing severe rash involving mucus membranes or skin necrosis: No Has patient had a PCN reaction that required hospitalization: No Has patient had a PCN reaction occurring within the last 10 years: No If all of the above answers are "NO", then may proceed with Cephalosporin use.   Sulfa Antibiotics Rash    Home Medications    Prior to Admission medications   Medication Sig Start Date End Date Taking? Authorizing  Provider  albuterol (PROVENTIL) (2.5 MG/3ML) 0.083% nebulizer solution Take 3 mLs (2.5 mg total) by nebulization every 2 (two) hours as needed for wheezing or shortness of breath. 05/04/16   Hosie Poisson, MD  ALPRAZolam Duanne Moron) 0.5 MG tablet Take 0.5 mg by mouth daily as needed for anxiety.     [provider]  aspirin EC 81 MG tablet Take 81 mg by mouth daily.    [provider]  budesonide-formoterol (SYMBICORT) 160-4.5 MCG/ACT inhaler Inhale 2 puffs into the lungs 2 (two) times daily.    [provider]  Cholecalciferol (VITAMIN D3) 2000 UNITS TABS Take 2,000 Units by mouth daily.    [provider]  clobetasol ointment (TEMOVATE) 0.05 % Apply topically. 01/28/22   [provider]  cyanocobalamin 500 MCG tablet Take 1,000 mcg by mouth daily.    [provider]  diltiazem (CARDIZEM CD) 240 MG 24 hr capsule Take 1 capsule (240 mg total) by mouth daily. 03/08/21   Tommie Raymond, NP  EPIPEN 2-PAK 0.3 MG/0.3ML SOAJ injection Inject 0.3 mg into the skin daily as needed (allergic reaction).  11/01/13   [provider]  ezetimibe (ZETIA) 10 MG tablet Take 10 mg by  mouth daily.    [provider]  FLUoxetine (PROZAC) 10 MG capsule Take 10 mg by mouth daily. 02/21/22   [provider]  fluticasone (FLONASE) 50 MCG/ACT nasal spray Place 2 sprays into the nose daily.    [provider]  furosemide (LASIX) 20 MG tablet TAKE 1 TABLET(20 MG) BY MOUTH EVERY OTHER DAY 05/13/22   Richardson Dopp T, PA-C  gabapentin (NEURONTIN) 100 MG capsule Take 100 mg by mouth at bedtime.     [provider]  isosorbide mononitrate (IMDUR) 30 MG 24 hr tablet Take 1 tablet (30 mg total) by mouth daily. 02/04/22 11/28/22  Richardson Dopp T, PA-C  metoprolol tartrate (LOPRESSOR) 25 MG tablet TAKE 1/2 TABLET(12.5 MG) BY MOUTH TWICE DAILY. 09/19/22   Sherren Mocha, MD  montelukast (SINGULAIR) 10 MG tablet Take 10 mg by mouth at bedtime.     [provider]  Multiple Vitamin (MULTIVITAMIN WITH MINERALS) TABS tablet Take 1 tablet by mouth daily.    [provider]  nitroGLYCERIN (NITROSTAT) 0.4 MG SL tablet Place 1 tablet (0.4 mg total) under the tongue every 5 (five) minutes as needed for chest pain (up to 3 doses. If taking 3rd dose call 911). 02/04/22   Richardson Dopp T, PA-C  pantoprazole (PROTONIX) 40 MG tablet TAKE 2 TABLETS(80 MG) BY MOUTH DAILY 04/07/22   Sherren Mocha, MD  Polyvinyl Alcohol (LIQUID TEARS OP) Place 1 drop into both eyes daily as needed (dry eyes).    [provider]  potassium chloride (KLOR-CON) 10 MEQ tablet Take 10 mEq by mouth 2 (two) times a week. Take 1 tablet 2 times a week 05/09/22   [provider]  PROAIR HFA 108 (90 BASE) MCG/ACT inhaler Inhale 1-2 puffs into the lungs every 4 (four) hours as needed for wheezing. 11/01/13   [provider]  rosuvastatin (CRESTOR) 20 MG tablet Take 1 tablet (20 mg total) by mouth daily at 6 PM. 05/30/16   Leanor Kail, [place]    Physical Exam    Vital Signs:  CHAELA BRANSCUM does not have vital signs available for review today.  Given telephonic nature of communication, physical exam is limited. AAOx3. NAD. Normal affect.  Speech and respirations are unlabored.  Accessory Clinical Findings    None  Assessment & Plan    1.  Preoperative Cardiovascular Risk Assessment:  According to the Revised Cardiac Risk Index (RCRI), her Perioperative Risk of Major Cardiac Event is (%): 6.6. Her Functional Capacity in METs is: 7.01 according to the [facility] Activity Status Index (DASI). Therefore, based on ACC/AHA guidelines, patient would be at acceptable risk for the planned procedure without further cardiovascular testing.   The patient was advised that if she develops new symptoms prior to surgery to contact our office to arrange for a follow-up visit, and she verbalized understanding.  Per office protocol, she may hold Aspirin  for 5-7 days prior to procedure. Please resume Aspirin as soon as possible postprocedure, at the discretion of the surgeon.   A copy of this note will be routed to requesting surgeon.  Time:   Today, I have spent 7 minutes with the patient with telehealth technology discussing medical history, symptoms, and management plan.     Donna Sciara, NP  12/05/2022, 2:09 PM

## 2022-12-12 DIAGNOSIS — Z1152 Encounter for screening for COVID-19: Secondary | ICD-10-CM | POA: Diagnosis not present

## 2022-12-12 DIAGNOSIS — J45909 Unspecified asthma, uncomplicated: Secondary | ICD-10-CM | POA: Diagnosis not present

## 2022-12-12 DIAGNOSIS — J189 Pneumonia, unspecified organism: Secondary | ICD-10-CM | POA: Diagnosis not present

## 2022-12-12 DIAGNOSIS — R0981 Nasal congestion: Secondary | ICD-10-CM | POA: Diagnosis not present

## 2022-12-12 DIAGNOSIS — B001 Herpesviral vesicular dermatitis: Secondary | ICD-10-CM | POA: Diagnosis not present

## 2022-12-12 DIAGNOSIS — N1832 Chronic kidney disease, stage 3b: Secondary | ICD-10-CM | POA: Diagnosis not present

## 2022-12-12 DIAGNOSIS — I129 Hypertensive chronic kidney disease with stage 1 through stage 4 chronic kidney disease, or unspecified chronic kidney disease: Secondary | ICD-10-CM | POA: Diagnosis not present

## 2022-12-12 DIAGNOSIS — R051 Acute cough: Secondary | ICD-10-CM | POA: Diagnosis not present

## 2022-12-15 DIAGNOSIS — Z1211 Encounter for screening for malignant neoplasm of colon: Secondary | ICD-10-CM | POA: Diagnosis not present

## 2022-12-26 LAB — COLOGUARD: COLOGUARD: NEGATIVE

## 2023-01-10 ENCOUNTER — Encounter (HOSPITAL_COMMUNITY): Payer: Medicare PPO

## 2023-01-10 DIAGNOSIS — J189 Pneumonia, unspecified organism: Secondary | ICD-10-CM | POA: Diagnosis not present

## 2023-01-12 ENCOUNTER — Other Ambulatory Visit (HOSPITAL_COMMUNITY): Payer: Self-pay | Admitting: *Deleted

## 2023-01-13 ENCOUNTER — Ambulatory Visit (HOSPITAL_COMMUNITY)
Admission: RE | Admit: 2023-01-13 | Discharge: 2023-01-13 | Disposition: A | Discharge status: Home / Self Care | Payer: Medicare PPO | Source: Ambulatory Visit | Attending: Internal Medicine | Admitting: Internal Medicine

## 2023-01-13 DIAGNOSIS — I2510 Atherosclerotic heart disease of native coronary artery without angina pectoris: Secondary | ICD-10-CM | POA: Insufficient documentation

## 2023-01-13 DIAGNOSIS — E7800 Pure hypercholesterolemia, unspecified: Secondary | ICD-10-CM | POA: Diagnosis present

## 2023-01-13 DIAGNOSIS — E785 Hyperlipidemia, unspecified: Secondary | ICD-10-CM | POA: Insufficient documentation

## 2023-01-13 MED ORDER — INCLISIRAN SODIUM 284 MG/1.5ML ~~LOC~~ SOSY
PREFILLED_SYRINGE | SUBCUTANEOUS | Status: AC
  Administered 2023-01-13: 284 mg via SUBCUTANEOUS
  Filled 2023-01-13: qty 1.5

## 2023-01-13 MED ORDER — INCLISIRAN SODIUM 284 MG/1.5ML ~~LOC~~ SOSY: 284.0000 mg | PREFILLED_SYRINGE | Freq: Once | SUBCUTANEOUS | Status: AC

## 2023-01-18 DIAGNOSIS — I129 Hypertensive chronic kidney disease with stage 1 through stage 4 chronic kidney disease, or unspecified chronic kidney disease: Secondary | ICD-10-CM | POA: Diagnosis not present

## 2023-01-18 DIAGNOSIS — I252 Old myocardial infarction: Secondary | ICD-10-CM | POA: Diagnosis not present

## 2023-01-18 DIAGNOSIS — J309 Allergic rhinitis, unspecified: Secondary | ICD-10-CM | POA: Diagnosis not present

## 2023-01-18 DIAGNOSIS — N1832 Chronic kidney disease, stage 3b: Secondary | ICD-10-CM | POA: Diagnosis not present

## 2023-01-18 DIAGNOSIS — D649 Anemia, unspecified: Secondary | ICD-10-CM | POA: Diagnosis not present

## 2023-01-18 DIAGNOSIS — R61 Generalized hyperhidrosis: Secondary | ICD-10-CM | POA: Diagnosis not present

## 2023-01-18 DIAGNOSIS — E785 Hyperlipidemia, unspecified: Secondary | ICD-10-CM | POA: Diagnosis not present

## 2023-01-18 DIAGNOSIS — F329 Major depressive disorder, single episode, unspecified: Secondary | ICD-10-CM | POA: Diagnosis not present

## 2023-01-18 DIAGNOSIS — N390 Urinary tract infection, site not specified: Secondary | ICD-10-CM | POA: Diagnosis not present

## 2023-01-31 ENCOUNTER — Ambulatory Visit: Payer: Medicare PPO | Attending: Physician Assistant | Admitting: Physician Assistant

## 2023-01-31 ENCOUNTER — Encounter: Payer: Self-pay | Admitting: *Deleted

## 2023-01-31 ENCOUNTER — Encounter: Payer: Self-pay | Admitting: Physician Assistant

## 2023-01-31 VITALS — BP 104/50 | HR 66 | Ht 64.0 in | Wt 225.8 lb

## 2023-01-31 DIAGNOSIS — I25119 Atherosclerotic heart disease of native coronary artery with unspecified angina pectoris: Secondary | ICD-10-CM

## 2023-01-31 DIAGNOSIS — J45909 Unspecified asthma, uncomplicated: Secondary | ICD-10-CM

## 2023-01-31 DIAGNOSIS — E785 Hyperlipidemia, unspecified: Secondary | ICD-10-CM

## 2023-01-31 DIAGNOSIS — I5032 Chronic diastolic (congestive) heart failure: Secondary | ICD-10-CM

## 2023-01-31 DIAGNOSIS — N1831 Chronic kidney disease, stage 3a: Secondary | ICD-10-CM

## 2023-01-31 DIAGNOSIS — R0602 Shortness of breath: Secondary | ICD-10-CM | POA: Diagnosis not present

## 2023-01-31 DIAGNOSIS — R072 Precordial pain: Secondary | ICD-10-CM

## 2023-01-31 DIAGNOSIS — I10 Essential (primary) hypertension: Secondary | ICD-10-CM | POA: Diagnosis not present

## 2023-01-31 DIAGNOSIS — I2510 Atherosclerotic heart disease of native coronary artery without angina pectoris: Secondary | ICD-10-CM | POA: Diagnosis not present

## 2023-01-31 MED ORDER — FUROSEMIDE 20 MG PO TABS: 20.0000 mg | ORAL_TABLET | Freq: Every day | ORAL | 3 refills | Status: DC

## 2023-01-31 MED ORDER — FUROSEMIDE 40 MG PO TABS: 40.0000 mg | ORAL_TABLET | Freq: Every day | ORAL | 3 refills | Status: DC

## 2023-01-31 NOTE — Progress Notes (Signed)
Cardiology Office Note:    Date:  01/31/2023  ID:  Donna Williamson, Donna Williamson 08/27/1946, MRN [id number] Welcome Providers Cardiologist:  Sherren Mocha, MD      Patient Profile:   Coronary artery disease  Intol to Brilinta (epistaxis); Allergy to Plavix  NSTEMI 04/2016 s/p DES to OM1 Myoview 3/21: low risk [place] 01/2021 s/p 2.5 x 16 mm DES to LAD and POBA of D2  Residual CAD on cath 02/17/21: pLAD 40, pLCx 7, OM1 stent patent Effient Rx due to allergy to Brilinta and Plavix  (HFpEF) heart failure with preserved ejection fraction  TTE 02/18/2021: GLS -17.4; EF 60-65, no RWMA, normal RVSF, mild LAE, trivial MR, severe nodular calcification of AV L coronary cusp, no AS  Parox Supraventricular Tachycardia  Monitor 06/2019: Normal sinus rhythm, avg HR 67, short SVT (8-10 beats), PVCs < 1% burden  Hypertension  Hyperlipidemia  Chronic kidney disease stage III COPD GERD Sjogren's syndrome  Sleep apnea  Carotid US 12/2013: Normal bilateral carotid arteries    History of Present Illness:   Donna Williamson is a 77 y.o. female returns for follow-up on CHF, CAD.  She was last seen by Dr. Burt Knack 08/01/2022. She is here with her husband. She has been under a great deal of stress recently. Her husband was admitted recently with what sounds like DKA and a stroke. Her mother has cancer and is in a nursing home. Donna Williamson is her primary caregiver. She has noted increasing shortness of breath w exertion as well as chest tightness over the last several weeks. She thinks it has gotten somewhat worse. Her PCP treated her for pneumonia in Jan. She had a f/u CXR a couple of weeks ago. Her WBC was still elevated but she did not require another round of antibiotics. She sees her pulmonologist tomorrow. She has had some relief of her symptoms with her rescue inhaler. She feels like her Symbicort is not working as well anymore. She assumed her shortness of breath and chest pain was related to stress +/- increased  pollen. She has not had any weight increase. She does not feel that her legs are swollen. She has had to start sleeping on 2 pillows at night.   Review of Systems  Constitutional: Negative for fever.  Respiratory:  Negative for cough.   Gastrointestinal:  Negative for hematochezia.  Genitourinary:  Negative for hematuria.   See HPI    Studies Reviewed:    EKG: NSR, HR 66, normal axis, LVH, inferior Q waves, no ST-T wave changes, QTc 423, similar to prior tracings  Risk Assessment/Calculations:         STOP-Bang Score:  6      Physical Exam:   VS:  BP (!) 104/50   Pulse 66   Ht 5\' 4"  (1.626 m)   Wt 225 lb 12.8 oz (102.4 kg)   SpO2 92%   BMI 38.76 kg/m    Wt Readings from Last 3 Encounters:  01/31/23 225 lb 12.8 oz (102.4 kg)  09/20/22 223 lb 12.8 oz (101.5 kg)  08/01/22 224 lb 3.2 oz (101.7 kg)    Constitutional:      Appearance: Healthy appearance. Not in distress.  Neck:     Vascular: JVD normal.  Pulmonary:     Breath sounds: No wheezing. Bibasilar Rales present.  Cardiovascular:     Normal rate. Regular rhythm. Normal S1. Normal S2.      Murmurs: There is no murmur.  Edema:    Peripheral  edema present.    Ankle: bilateral 1+ edema of the ankle. Abdominal:     Palpations: Abdomen is soft.       ASSESSMENT AND PLAN:   Chronic heart failure with preserved ejection fraction ([place]) EF 60-65 by echocardiogram in 01/2021. She takes Lasix 20 mg every other day. She is NYHA IIb-III. She has bibasilar crackles on exam as well as pedal edema. She notes she ate at [facility] prior to coming in today. Question if her diet is rich in salt. I suspect volume overload is playing a role in her symptoms. Although, she has multiple potential causes (recent hx of pneumonia, COPD/Asthma, CAD, increased emotional stress).  Increase Lasix to 20 mg once daily BMET, BNP today Repeat BMET in 1 week Consider repeating an echocardiogram if her BNP is significantly elevated Obtain recent labs,  CXR from primary care F/u in 4 weeks, or sooner if symptoms worsen  Coronary artery disease involving native coronary artery with angina pectoris Hx of NSTEMI in 2017 tx with DES to OM1 and DES to the LAD in the setting of unstable angina in 2022. She required POBA to the D2 as well. There was residual 80% stenosis post PCI. She has recently noted chest tightness from time to time. She notes it is similar to prior angina but not as bad. She seems to be volume overloaded which may be contributing to her symptoms. Worsening Asthma may also explain some of her chest tightness. Her EKG does not demonstrate any acute changes.  Adjust Lasix and obtain labs as noted. Obtain Lexiscan Myoview to rule out ischemia. F/u in 4 weeks or sooner if symptoms worsen/Myoview abnormal. Continue aspirin 81 mg daily, diltiazem to 40 mg daily, Zetia 10 mg daily, Imdur 30 mg daily, metoprolol tartrate 12.5 mg twice daily,.  Nitroglycerin, Crestor 20 mg daily  Stage 3a chronic kidney disease ([place]) Obtain BMET today and repeat BMET in 1 week given adjustments in furosemide dosing.  Hyperlipidemia LDL goal <70 LDL in July 2023 was 53.  Lipids optimal.  She is now on Leqvio in addition to Zetia 10 mg daily and Crestor 20 mg daily.  Continue current management.  Essential hypertension Blood sugar is controlled.  Continue diltiazem 240 mg daily, Imdur 30 mg daily, metoprolol tartrate 12.5 mg twice daily.  Asthma F/u with pulmonology tomorrow as planned.       Shared Decision Making/Informed Consent The risks [chest pain, shortness of breath, cardiac arrhythmias, dizziness, blood pressure fluctuations, myocardial infarction, stroke/transient ischemic attack, nausea, vomiting, allergic reaction, radiation exposure, metallic taste sensation and life-threatening complications (estimated to be 1 in 10,000)], benefits (risk stratification, diagnosing coronary artery disease, treatment guidance) and alternatives of a nuclear  stress test were discussed in detail with Donna Williamson and she agrees to proceed.   Dispo:  Return in about 4 weeks (around 02/28/2023) for Follow up after testing, w/ Dr. Burt Knack, or Richardson Dopp, PA-C.  Signed, Richardson Dopp, PA-C

## 2023-01-31 NOTE — Assessment & Plan Note (Signed)
Blood sugar is controlled.  Continue diltiazem 240 mg daily, Imdur 30 mg daily, metoprolol tartrate 12.5 mg twice daily.

## 2023-01-31 NOTE — Assessment & Plan Note (Addendum)
Hx of NSTEMI in 2017 tx with DES to OM1 and DES to the LAD in the setting of unstable angina in 2022. She required POBA to the D2 as well. There was residual 80% stenosis post PCI. She has recently noted chest tightness from time to time. She notes it is similar to prior angina but not as bad. She seems to be volume overloaded which may be contributing to her symptoms. Worsening Asthma may also explain some of her chest tightness. Her EKG does not demonstrate any acute changes.  Adjust Lasix and obtain labs as noted. Obtain Lexiscan Myoview to rule out ischemia. F/u in 4 weeks or sooner if symptoms worsen/Myoview abnormal. Continue aspirin 81 mg daily, diltiazem to 40 mg daily, Zetia 10 mg daily, Imdur 30 mg daily, metoprolol tartrate 12.5 mg twice daily,.  Nitroglycerin, Crestor 20 mg daily

## 2023-01-31 NOTE — Assessment & Plan Note (Signed)
Obtain BMET today and repeat BMET in 1 week given adjustments in furosemide dosing.

## 2023-01-31 NOTE — Assessment & Plan Note (Signed)
EF 60-65 by echocardiogram in 01/2021. She takes Lasix 20 mg every other day. She is NYHA IIb-III. She has bibasilar crackles on exam as well as pedal edema. She notes she ate at [facility] prior to coming in today. Question if her diet is rich in salt. I suspect volume overload is playing a role in her symptoms. Although, she has multiple potential causes (recent hx of pneumonia, COPD/Asthma, CAD, increased emotional stress).  Increase Lasix to 20 mg once daily BMET, BNP today Repeat BMET in 1 week Consider repeating an echocardiogram if her BNP is significantly elevated Obtain recent labs, CXR from primary care F/u in 4 weeks, or sooner if symptoms worsen

## 2023-01-31 NOTE — Assessment & Plan Note (Signed)
LDL in July 2023 was 53.  Lipids optimal.  She is now on Leqvio in addition to Zetia 10 mg daily and Crestor 20 mg daily.  Continue current management.

## 2023-01-31 NOTE — Patient Instructions (Signed)
Medication Instructions:  1.Increase lasix 20 mg to daily. *If you need a refill on your cardiac medications before your next appointment, please call your pharmacy*   Lab Work: BMET, BNP--TODAY BMET --IN 1 WEEK If you have labs (blood work) drawn today and your tests are completely normal, you will receive your results only by: [place] (if you have MyChart) OR A paper copy in the mail If you have any lab test that is abnormal or we need to change your treatment, we will call you to review the results.   Testing/Procedures: Your physician has requested that you have a lexiscan myoview. For further information please visit [email or web address]. Please follow instruction sheet, as given.   Follow-Up: At [facility], you and your health needs are our priority.  As part of our continuing mission to provide you with exceptional heart care, we have created designated Provider Care Teams.  These Care Teams include your primary Cardiologist (physician) and Advanced Practice Providers (APPs -  Physician Assistants and Nurse Practitioners) who all work together to provide you with the care you need, when you need it.   Your next appointment:   1 month(s)  Provider:   Sherren Mocha, MD  or Richardson Dopp, PA-C

## 2023-01-31 NOTE — Assessment & Plan Note (Signed)
F/u with pulmonology tomorrow as planned.

## 2023-02-01 DIAGNOSIS — J3089 Other allergic rhinitis: Secondary | ICD-10-CM | POA: Diagnosis not present

## 2023-02-01 DIAGNOSIS — J4530 Mild persistent asthma, uncomplicated: Secondary | ICD-10-CM | POA: Diagnosis not present

## 2023-02-01 DIAGNOSIS — T50995D Adverse effect of other drugs, medicaments and biological substances, subsequent encounter: Secondary | ICD-10-CM | POA: Diagnosis not present

## 2023-02-01 DIAGNOSIS — H1045 Other chronic allergic conjunctivitis: Secondary | ICD-10-CM | POA: Diagnosis not present

## 2023-02-01 LAB — BASIC METABOLIC PANEL
BUN/Creatinine Ratio: 14 (ref 12–28)
BUN: 22 mg/dL (ref 8–27)
CO2: 22 mmol/L (ref 20–29)
Calcium: 9.7 mg/dL (ref 8.7–10.3)
Chloride: 105 mmol/L (ref 96–106)
Creatinine, Ser: 1.53 mg/dL — ABNORMAL HIGH (ref 0.57–1.00)
Glucose: 90 mg/dL (ref 70–99)
Potassium: 5 mmol/L (ref 3.5–5.2)
Sodium: 142 mmol/L (ref 134–144)
eGFR: 34 mL/min/{1.73_m2} — ABNORMAL LOW (ref >59)

## 2023-02-01 LAB — PRO B NATRIURETIC PEPTIDE: NT-Pro BNP: 1137 pg/mL — ABNORMAL HIGH (ref 0–738)

## 2023-02-01 NOTE — Addendum Note (Signed)
Addended byKathlen Mody, Nicki Reaper T on: 02/01/2023 02:18 PM   Modules accepted: Orders

## 2023-02-03 ENCOUNTER — Telehealth (HOSPITAL_COMMUNITY): Payer: Self-pay | Admitting: *Deleted

## 2023-02-03 NOTE — Telephone Encounter (Signed)
Left message on voicemail per DPR in reference to upcoming appointment scheduled on 02/08/2023 at 7:45 with detailed instructions given per Myocardial Perfusion Study Information Sheet for the test. LM to arrive 15 minutes early, and that it is imperative to arrive on time for appointment to keep from having the test rescheduled. If you need to cancel or reschedule your appointment, please call the office within 24 hours of your appointment. Failure to do so may result in a cancellation of your appointment, and a $50 no show fee. Phone number given for call back for any questions.

## 2023-02-08 ENCOUNTER — Ambulatory Visit: Payer: Medicare PPO | Attending: Physician Assistant

## 2023-02-08 ENCOUNTER — Ambulatory Visit: Payer: Medicare PPO

## 2023-02-08 VITALS — Ht 64.0 in | Wt 225.0 lb

## 2023-02-08 DIAGNOSIS — R072 Precordial pain: Secondary | ICD-10-CM | POA: Diagnosis not present

## 2023-02-08 DIAGNOSIS — I10 Essential (primary) hypertension: Secondary | ICD-10-CM | POA: Insufficient documentation

## 2023-02-08 DIAGNOSIS — I25119 Atherosclerotic heart disease of native coronary artery with unspecified angina pectoris: Secondary | ICD-10-CM

## 2023-02-08 DIAGNOSIS — I5032 Chronic diastolic (congestive) heart failure: Secondary | ICD-10-CM | POA: Insufficient documentation

## 2023-02-08 DIAGNOSIS — R0602 Shortness of breath: Secondary | ICD-10-CM | POA: Diagnosis not present

## 2023-02-08 DIAGNOSIS — N1831 Chronic kidney disease, stage 3a: Secondary | ICD-10-CM | POA: Diagnosis not present

## 2023-02-08 LAB — MYOCARDIAL PERFUSION IMAGING
LV dias vol: 86 mL (ref 46–106)
LV sys vol: 42 mL
Nuc Stress EF: 51 %
Peak HR: 70 {beats}/min
Rest HR: 59 {beats}/min
Rest Nuclear Isotope Dose: 10.7 mCi
SDS: 2
SRS: 0
SSS: 2
ST Depression (mm): 0 mm
Stress Nuclear Isotope Dose: 31.6 mCi
TID: 0.99

## 2023-02-08 MED ORDER — REGADENOSON 0.4 MG/5ML IV SOLN
0.4000 mg | Freq: Once | INTRAVENOUS | Status: AC
  Administered 2023-02-08: 0.4 mg via INTRAVENOUS

## 2023-02-08 MED ORDER — TECHNETIUM TC 99M TETROFOSMIN IV KIT
31.6000 | PACK | Freq: Once | INTRAVENOUS | Status: AC | PRN
  Administered 2023-02-08: 31.6 via INTRAVENOUS

## 2023-02-08 MED ORDER — TECHNETIUM TC 99M TETROFOSMIN IV KIT
10.7000 | PACK | Freq: Once | INTRAVENOUS | Status: AC | PRN
  Administered 2023-02-08: 10.7 via INTRAVENOUS

## 2023-02-09 LAB — BASIC METABOLIC PANEL
BUN/Creatinine Ratio: 18 (ref 12–28)
BUN: 22 mg/dL (ref 8–27)
CO2: 21 mmol/L (ref 20–29)
Calcium: 9 mg/dL (ref 8.7–10.3)
Chloride: 104 mmol/L (ref 96–106)
Creatinine, Ser: 1.23 mg/dL — ABNORMAL HIGH (ref 0.57–1.00)
Glucose: 91 mg/dL (ref 70–99)
Potassium: 4.2 mmol/L (ref 3.5–5.2)
Sodium: 142 mmol/L (ref 134–144)
eGFR: 45 mL/min/{1.73_m2} — ABNORMAL LOW (ref >59)

## 2023-02-13 ENCOUNTER — Other Ambulatory Visit: Payer: Self-pay | Admitting: Physician Assistant

## 2023-02-13 NOTE — Progress Notes (Signed)
Pt has been made aware of normal result and verbalized understanding.  jw

## 2023-02-20 DIAGNOSIS — I10 Essential (primary) hypertension: Secondary | ICD-10-CM | POA: Diagnosis not present

## 2023-02-20 DIAGNOSIS — K219 Gastro-esophageal reflux disease without esophagitis: Secondary | ICD-10-CM | POA: Diagnosis not present

## 2023-02-20 DIAGNOSIS — E785 Hyperlipidemia, unspecified: Secondary | ICD-10-CM | POA: Diagnosis not present

## 2023-02-28 ENCOUNTER — Ambulatory Visit (HOSPITAL_COMMUNITY): Payer: Medicare PPO | Attending: Internal Medicine

## 2023-02-28 DIAGNOSIS — I25119 Atherosclerotic heart disease of native coronary artery with unspecified angina pectoris: Secondary | ICD-10-CM | POA: Insufficient documentation

## 2023-02-28 DIAGNOSIS — R072 Precordial pain: Secondary | ICD-10-CM | POA: Insufficient documentation

## 2023-02-28 DIAGNOSIS — I5032 Chronic diastolic (congestive) heart failure: Secondary | ICD-10-CM | POA: Insufficient documentation

## 2023-02-28 DIAGNOSIS — R0602 Shortness of breath: Secondary | ICD-10-CM | POA: Diagnosis not present

## 2023-02-28 LAB — ECHOCARDIOGRAM COMPLETE
AR max vel: 1.49 cm2
AV Area VTI: 1.34 cm2
AV Area mean vel: 1.56 cm2
AV Mean grad: 9.6 mmHg
AV Peak grad: 15.9 mmHg
Ao pk vel: 1.99 m/s
Area-P 1/2: 5.4 cm2
S' Lateral: 2.9 cm

## 2023-03-01 NOTE — Progress Notes (Signed)
Pt has been made aware of normal result and verbalized understanding.  jw

## 2023-03-07 ENCOUNTER — Encounter: Payer: Self-pay | Admitting: Physician Assistant

## 2023-03-07 DIAGNOSIS — I350 Nonrheumatic aortic (valve) stenosis: Secondary | ICD-10-CM

## 2023-03-07 HISTORY — DX: Nonrheumatic aortic (valve) stenosis: I35.0

## 2023-03-07 NOTE — Progress Notes (Unsigned)
Cardiology Office Note:    Date:  03/08/2023  ID:  Donna Williamson 14-Aug-1946, MRN [id number] PCP: Rodrigo Ran, MD  [facility] Providers Cardiologist:  Tonny Bollman, MD          Patient Profile:   Coronary artery disease  Intol to Brilinta (epistaxis); Allergy to Plavix  NSTEMI 04/2016 s/p DES to OM1 Myoview 3/21: low risk [place] 01/2021 s/p 2.5 x 16 mm DES to LAD and POBA of D2  Residual CAD on cath 02/17/21: pLAD 40, pLCx 40, OM1 stent patent Effient Rx due to allergy to Brilinta and Plavix  Myoview 02/08/2023: Normal perfusion, EF 51 (HFpEF) heart failure with preserved ejection fraction  TTE 02/18/2021: EF 60-65  TTE 02/28/2023: EF 55-60, no RWMA, mild asymmetric LVH, NL RVSF, mild MR, mild aortic stenosis (bulky subvalvular calcification), mean AV gradient 9.6, V-max 199 cm/s, RAP 3 Mild aortic stenosis  Parox Supraventricular Tachycardia  Monitor 06/2019: NSR avg HR 67, short SVT (8-10 beats), PVCs < 1% burden  Hypertension  Hyperlipidemia  Chronic kidney disease stage III COPD GERD Sjogren's syndrome  Sleep apnea  Carotid US 12/2013: Normal bilateral carotid arteries        History of Present Illness:   Donna Williamson is a 77 y.o. female returns for follow-up of CAD, CHF.  She was seen for routine follow-up 4-24.  She noted that she had been under a great deal of stress.  She had been experiencing worsening shortness of breath with exertion as well as chest tightness.  I increased her furosemide and set up for an echocardiogram and stress test.  Her BNP was significantly elevated at 1137.  I adjusted her furosemide further.  Creatinine remained stable.  Her stress test demonstrated no ischemia.  Echocardiogram demonstrated normal EF, mild aortic stenosis. She is here alone. She is feeling better. Her shortness of breath is improved, but not ideal. She has not had orthopnea, leg edema, chest pain.   Review of Systems  Musculoskeletal:  Positive for joint pain.    See HPI    Studies Reviewed:    EKG:  not done  Risk Assessment/Calculations:         STOP-Bang Score:  6      Physical Exam:   VS:  BP (!) 110/58   Pulse 80   Ht 5\' 4"  (1.626 m)   Wt 223 lb (101.2 kg)   SpO2 93%   BMI 38.28 kg/m    Wt Readings from Last 3 Encounters:  03/08/23 223 lb (101.2 kg)  02/08/23 225 lb (102.1 kg)  01/31/23 225 lb 12.8 oz (102.4 kg)    Constitutional:      Appearance: Healthy appearance. Not in distress.  Neck:     Vascular: JVD normal.  Pulmonary:     Breath sounds: Normal breath sounds. No wheezing. No rales.  Cardiovascular:     Normal rate. Regular rhythm.     Murmurs: There is a grade 1/6 systolic murmur at the URSB.  Edema:    Peripheral edema absent.  Abdominal:     Palpations: Abdomen is soft.       ASSESSMENT AND PLAN:   Chronic heart failure with preserved ejection fraction (HCC) EF normal on recent echocardiogram. She is NYHA IIb. Volume status improved. She has chronic kidney disease. I have recommended adding an SGLT2 inhibitor to advance GDMT for her CHF.  Continue Lasix 20 mg once daily, K+ 10 mEq twice a week  Start Farxiga 10 mg once  daily  We discussed potential for GU infection BMET 2 weeks F/u 3-4 mos  Coronary artery disease involving native coronary artery with angina pectoris (HCC) Hx of NSTEMI in 2017 tx with DES to OM1 and DES to the LAD in the setting of unstable angina in 2022. She required POBA to the D2 as well. There was residual 80% stenosis post PCI. Recent Myoview low risk. Her symptoms of chest pain have improved and were likely related to volume excess. Continue ASA 81 mg once daily, Diltiazem 240 mg once daily, Imdur 30 mg once daily, Metoprolol tartrate 12.5 mg twice daily, Crestor 20 mg once daily, prn NTG. F/u 3-4 mos.  Mild aortic stenosis Mean gradient 9.6 on recent echocardiogram. Given degree of calcification, consider repeat echocardiogram in 1-2 years.   Essential hypertension BP controlled.  Continue current Rx.   Stage 3a chronic kidney disease (HCC) We discussed the benefits of SGLT2 inhibitors in patients with chronic kidney disease.     Dispo:  Return in about 3 months (around 06/08/2023) for w/ Dr. Excell Seltzer, or Tereso Newcomer, PA-C.  Signed, Tereso Newcomer, PA-C

## 2023-03-08 ENCOUNTER — Telehealth: Payer: Self-pay | Admitting: Cardiovascular Disease

## 2023-03-08 ENCOUNTER — Other Ambulatory Visit: Payer: Self-pay | Admitting: *Deleted

## 2023-03-08 ENCOUNTER — Ambulatory Visit: Payer: Medicare PPO | Attending: Physician Assistant | Admitting: Physician Assistant

## 2023-03-08 ENCOUNTER — Encounter: Payer: Self-pay | Admitting: Physician Assistant

## 2023-03-08 VITALS — BP 110/58 | HR 80 | Ht 64.0 in | Wt 223.0 lb

## 2023-03-08 DIAGNOSIS — I10 Essential (primary) hypertension: Secondary | ICD-10-CM | POA: Diagnosis not present

## 2023-03-08 DIAGNOSIS — N1831 Chronic kidney disease, stage 3a: Secondary | ICD-10-CM

## 2023-03-08 DIAGNOSIS — I350 Nonrheumatic aortic (valve) stenosis: Secondary | ICD-10-CM | POA: Diagnosis not present

## 2023-03-08 DIAGNOSIS — I5032 Chronic diastolic (congestive) heart failure: Secondary | ICD-10-CM | POA: Diagnosis not present

## 2023-03-08 DIAGNOSIS — I25119 Atherosclerotic heart disease of native coronary artery with unspecified angina pectoris: Secondary | ICD-10-CM

## 2023-03-08 MED ORDER — DAPAGLIFLOZIN PROPANEDIOL 10 MG PO TABS: 10.0000 mg | ORAL_TABLET | Freq: Every day | ORAL | 0 refills | Status: DC

## 2023-03-08 MED ORDER — DAPAGLIFLOZIN PROPANEDIOL 10 MG PO TABS: 10.0000 mg | ORAL_TABLET | Freq: Every day | ORAL | 11 refills | Status: DC

## 2023-03-08 NOTE — Assessment & Plan Note (Signed)
Hx of NSTEMI in 2017 tx with DES to OM1 and DES to the LAD in the setting of unstable angina in 2022. She required POBA to the D2 as well. There was residual 80% stenosis post PCI. Recent Myoview low risk. Her symptoms of chest pain have improved and were likely related to volume excess. Continue ASA 81 mg once daily, Diltiazem 240 mg once daily, Imdur 30 mg once daily, Metoprolol tartrate 12.5 mg twice daily, Crestor 20 mg once daily, prn NTG. F/u 3-4 mos.

## 2023-03-08 NOTE — Assessment & Plan Note (Signed)
EF normal on recent echocardiogram. She is NYHA IIb. Volume status improved. She has chronic kidney disease. I have recommended adding an SGLT2 inhibitor to advance GDMT for her CHF.  Continue Lasix 20 mg once daily, K+ 10 mEq twice a week  Start Farxiga 10 mg once daily  We discussed potential for GU infection BMET 2 weeks F/u 3-4 mos

## 2023-03-08 NOTE — Assessment & Plan Note (Signed)
Mean gradient 9.6 on recent echocardiogram. Given degree of calcification, consider repeat echocardiogram in 1-2 years.

## 2023-03-08 NOTE — Telephone Encounter (Signed)
Pt c/o medication issue:  1. Name of Medication: farxiga  2. How are you currently taking this medication (dosage and times per day)?    3. Are you having a reaction (difficulty breathing--STAT)? no  4. What is your medication issue? Patient states she was given samples of this medication. Buy she states that she is unable to afford medication. Please advise

## 2023-03-08 NOTE — Telephone Encounter (Signed)
Patient can download and print a patient asisstance form from [place] and [place].  Form can be found at [email or web address]  Alternatively, she can also fill out form online.

## 2023-03-08 NOTE — Patient Instructions (Signed)
Medication Instructions:  Your physician has recommended you make the following change in your medication:   START Farxiga 10 mg taking 1 daily   *If you need a refill on your cardiac medications before your next appointment, please call your pharmacy*   Lab Work: 2 WEEKS:  BMET  If you have labs (blood work) drawn today and your tests are completely normal, you will receive your results only by: MyChart Message (if you have MyChart) OR A paper copy in the mail If you have any lab test that is abnormal or we need to change your treatment, we will call you to review the results.   Testing/Procedures: None ordered   Follow-Up: At [facility], you and your health needs are our priority.  As part of our continuing mission to provide you with exceptional heart care, we have created designated Provider Care Teams.  These Care Teams include your primary Cardiologist (physician) and Advanced Practice Providers (APPs -  Physician Assistants and Nurse Practitioners) who all work together to provide you with the care you need, when you need it.  We recommend signing up for the patient portal called "MyChart".  Sign up information is provided on this After Visit Summary.  MyChart is used to connect with patients for Virtual Visits (Telemedicine).  Patients are able to view lab/test results, encounter notes, upcoming appointments, etc.  Non-urgent messages can be sent to your provider as well.   To learn more about what you can do with MyChart, go to [email or web address].    Your next appointment:   3-4 MONTHS   Provider:   Tonny Bollman, MD  or Tereso Newcomer, PA-C         Other Instructions

## 2023-03-08 NOTE — Assessment & Plan Note (Signed)
BP controlled. Continue current Rx.

## 2023-03-08 NOTE — Assessment & Plan Note (Signed)
We discussed the benefits of SGLT2 inhibitors in patients with chronic kidney disease.

## 2023-03-10 ENCOUNTER — Telehealth: Payer: Self-pay | Admitting: Pharmacist

## 2023-03-10 NOTE — Telephone Encounter (Signed)
I spoke with patient.  She is unable to afford farxiga at this time as she is not working now.  I gave patient the website address and phone number for [place] assistance [place].  Patient will contact assistance program and let us know if she applies so office can get provider information completed.

## 2023-03-10 NOTE — Progress Notes (Signed)
[facility])  [facility] Quality Pharmacy Team   03/10/2023  KHRYSTYNA WILDRICK 1946/07/11 [id number]  Reason for referral: Medication assistance with Farxiga  Referral source:  Patient  Current insurance: [employer]  Outreach:  Received voicemail from Mrs. Kaeleigh Melucci regarding patient assistance for [place]. Successful telephone call with Natalia Leatherwood.  HIPAA identifiers verified.    Medication Assistance Findings:  Medication assistance needs identified: [place]  Extra Help:  Not eligible for Extra Help Low Income Subsidy based on reported income and assets  Additional medication assistance options reviewed with patient as warranted:  No other options identified  Plan: I will route patient assistance letter to [facility] pharmacy technician who will coordinate patient assistance program application process for medications listed above.  [facility] pharmacy technician will assist with obtaining all required documents from both patient and provider(s) and submit application(s) once completed.  Thank you for allowing [facility] pharmacy to be a part of this patient's care.   Reynold Bowen, PharmD [facility]  [facility] Clinical Pharmacist Direct Dial: [phone]

## 2023-03-10 NOTE — Telephone Encounter (Signed)
Patient is calling back to check on the status of getting approved for this grant. I asked the patient if she had filled out the patient assistance form due to [facility] documentation. She states she has not due to not being advised that she needs to do this. Please advise.

## 2023-03-15 ENCOUNTER — Other Ambulatory Visit: Payer: Self-pay | Admitting: Cardiovascular Disease

## 2023-03-16 ENCOUNTER — Telehealth: Payer: Self-pay | Admitting: Pharmacy Technician

## 2023-03-16 ENCOUNTER — Telehealth: Payer: Self-pay

## 2023-03-16 DIAGNOSIS — Z5986 Financial insecurity: Secondary | ICD-10-CM

## 2023-03-16 NOTE — Telephone Encounter (Signed)
**Note De-Identified Romin Divita Obfuscation** We received the providers page of a [place] and[place] assistance application for [place] from [employer]. CPhT at [facility] with a request for [place] to finish completing the page, have Tereso Newcomer. PA-c sign and date it, and to fax it back to her at [phone].  I have completed the page and have given it to Scott's nurse so she can obtain his signature, date it, and to fax back to [place] at the fax number written on the cover letter included.

## 2023-03-16 NOTE — Progress Notes (Signed)
[facility] Customer service manager [facility])                                            [facility] Quality Pharmacy Team    03/16/2023  Donna Williamson 1946-06-09 [id number]                                      Medication Assistance Referral  Referral From: [facility] RPh Tiffany B.  Medication/Company: [name] / [place] Patient application portion:  Mailed Provider application portion: Faxed  to Tereso Newcomer, [facility] Provider address/fax verified via: [employer]. Licia Harl, CPhT [facility] [employer]  [phone]

## 2023-03-17 NOTE — Telephone Encounter (Signed)
Faxed to appropriate place with confirmation received. Saved for JW.

## 2023-03-23 ENCOUNTER — Ambulatory Visit: Payer: Medicare PPO | Attending: Physician Assistant

## 2023-03-23 DIAGNOSIS — I5032 Chronic diastolic (congestive) heart failure: Secondary | ICD-10-CM | POA: Diagnosis not present

## 2023-03-23 DIAGNOSIS — I25119 Atherosclerotic heart disease of native coronary artery with unspecified angina pectoris: Secondary | ICD-10-CM

## 2023-03-23 DIAGNOSIS — I350 Nonrheumatic aortic (valve) stenosis: Secondary | ICD-10-CM | POA: Diagnosis not present

## 2023-03-24 LAB — BASIC METABOLIC PANEL
BUN/Creatinine Ratio: 15 (ref 12–28)
BUN: 20 mg/dL (ref 8–27)
CO2: 19 mmol/L — ABNORMAL LOW (ref 20–29)
Calcium: 9.2 mg/dL (ref 8.7–10.3)
Chloride: 105 mmol/L (ref 96–106)
Creatinine, Ser: 1.35 mg/dL — ABNORMAL HIGH (ref 0.57–1.00)
Glucose: 91 mg/dL (ref 70–99)
Potassium: 4.5 mmol/L (ref 3.5–5.2)
Sodium: 140 mmol/L (ref 134–144)
eGFR: 40 mL/min/{1.73_m2} — ABNORMAL LOW (ref >59)

## 2023-03-28 NOTE — Progress Notes (Signed)
Pt has been made aware of normal result and verbalized understanding.  jw

## 2023-03-29 ENCOUNTER — Telehealth: Payer: Self-pay | Admitting: *Deleted

## 2023-03-29 MED ORDER — DAPAGLIFLOZIN PROPANEDIOL 5 MG PO TABS: 5.0000 mg | ORAL_TABLET | Freq: Every day | ORAL | 11 refills | Status: DC

## 2023-03-29 NOTE — Telephone Encounter (Signed)
-----   Message from Beatrice Lecher, [place] sent at 03/28/2023  4:47 PM EDT ----- Try cutting [name] in 1/2 (5 mg once daily). If symptoms return, discontinue it. Tereso Newcomer, PA-C    03/28/2023 4:46 PM

## 2023-04-13 ENCOUNTER — Other Ambulatory Visit (HOSPITAL_COMMUNITY): Payer: Self-pay | Admitting: *Deleted

## 2023-04-13 DIAGNOSIS — J029 Acute pharyngitis, unspecified: Secondary | ICD-10-CM | POA: Diagnosis not present

## 2023-04-13 DIAGNOSIS — J4540 Moderate persistent asthma, uncomplicated: Secondary | ICD-10-CM | POA: Diagnosis not present

## 2023-04-13 DIAGNOSIS — R519 Headache, unspecified: Secondary | ICD-10-CM | POA: Diagnosis not present

## 2023-04-13 DIAGNOSIS — J018 Other acute sinusitis: Secondary | ICD-10-CM | POA: Diagnosis not present

## 2023-04-13 DIAGNOSIS — Z1152 Encounter for screening for COVID-19: Secondary | ICD-10-CM | POA: Diagnosis not present

## 2023-04-13 DIAGNOSIS — J309 Allergic rhinitis, unspecified: Secondary | ICD-10-CM | POA: Diagnosis not present

## 2023-04-17 ENCOUNTER — Ambulatory Visit (HOSPITAL_COMMUNITY)
Admission: RE | Admit: 2023-04-17 | Discharge: 2023-04-17 | Disposition: A | Discharge status: Home / Self Care | Payer: Medicare PPO | Source: Ambulatory Visit | Attending: Internal Medicine | Admitting: Internal Medicine

## 2023-04-17 DIAGNOSIS — E785 Hyperlipidemia, unspecified: Secondary | ICD-10-CM | POA: Diagnosis not present

## 2023-04-17 DIAGNOSIS — I2510 Atherosclerotic heart disease of native coronary artery without angina pectoris: Secondary | ICD-10-CM | POA: Insufficient documentation

## 2023-04-17 MED ORDER — INCLISIRAN SODIUM 284 MG/1.5ML ~~LOC~~ SOSY: 284.0000 mg | PREFILLED_SYRINGE | Freq: Once | SUBCUTANEOUS | Status: AC

## 2023-04-17 MED ORDER — INCLISIRAN SODIUM 284 MG/1.5ML ~~LOC~~ SOSY
PREFILLED_SYRINGE | SUBCUTANEOUS | Status: AC
  Administered 2023-04-17: 284 mg via SUBCUTANEOUS
  Filled 2023-04-17: qty 1.5

## 2023-04-27 ENCOUNTER — Other Ambulatory Visit: Payer: Self-pay | Admitting: Cardiovascular Disease

## 2023-05-15 ENCOUNTER — Emergency Department (HOSPITAL_BASED_OUTPATIENT_CLINIC_OR_DEPARTMENT_OTHER)
Admission: EM | Admit: 2023-05-15 | Discharge: 2023-05-16 | Disposition: A | Discharge status: Home / Self Care | Payer: Medicare PPO | Attending: Emergency Medicine | Admitting: Emergency Medicine

## 2023-05-15 ENCOUNTER — Encounter (HOSPITAL_BASED_OUTPATIENT_CLINIC_OR_DEPARTMENT_OTHER): Payer: Self-pay

## 2023-05-15 ENCOUNTER — Other Ambulatory Visit: Payer: Self-pay

## 2023-05-15 DIAGNOSIS — Z7982 Long term (current) use of aspirin: Secondary | ICD-10-CM | POA: Insufficient documentation

## 2023-05-15 DIAGNOSIS — Z79899 Other long term (current) drug therapy: Secondary | ICD-10-CM | POA: Diagnosis not present

## 2023-05-15 DIAGNOSIS — I10 Essential (primary) hypertension: Secondary | ICD-10-CM | POA: Insufficient documentation

## 2023-05-15 DIAGNOSIS — I2510 Atherosclerotic heart disease of native coronary artery without angina pectoris: Secondary | ICD-10-CM | POA: Diagnosis not present

## 2023-05-15 DIAGNOSIS — R509 Fever, unspecified: Secondary | ICD-10-CM | POA: Insufficient documentation

## 2023-05-15 DIAGNOSIS — Z1152 Encounter for screening for COVID-19: Secondary | ICD-10-CM | POA: Diagnosis not present

## 2023-05-15 DIAGNOSIS — N390 Urinary tract infection, site not specified: Secondary | ICD-10-CM | POA: Diagnosis not present

## 2023-05-15 DIAGNOSIS — D72829 Elevated white blood cell count, unspecified: Secondary | ICD-10-CM | POA: Insufficient documentation

## 2023-05-15 LAB — RESP PANEL BY RT-PCR (RSV, FLU A&B, COVID)  RVPGX2
Influenza A by PCR: NEGATIVE
Influenza B by PCR: NEGATIVE
Resp Syncytial Virus by PCR: NEGATIVE
SARS Coronavirus 2 by RT PCR: NEGATIVE

## 2023-05-15 LAB — COMPREHENSIVE METABOLIC PANEL
ALT: 13 U/L (ref 0–44)
AST: 27 U/L (ref 15–41)
Albumin: 4.2 g/dL (ref 3.5–5.0)
Alkaline Phosphatase: 88 U/L (ref 38–126)
Anion gap: 12 (ref 5–15)
BUN: 20 mg/dL (ref 8–23)
CO2: 22 mmol/L (ref 22–32)
Calcium: 9.2 mg/dL (ref 8.9–10.3)
Chloride: 102 mmol/L (ref 98–111)
Creatinine, Ser: 1.14 mg/dL — ABNORMAL HIGH (ref 0.44–1.00)
GFR, Estimated: 49 mL/min — ABNORMAL LOW (ref >60)
Glucose, Bld: 113 mg/dL — ABNORMAL HIGH (ref 70–99)
Potassium: 4.1 mmol/L (ref 3.5–5.1)
Sodium: 136 mmol/L (ref 135–145)
Total Bilirubin: 0.3 mg/dL (ref 0.3–1.2)
Total Protein: 7.6 g/dL (ref 6.5–8.1)

## 2023-05-15 LAB — URINALYSIS, ROUTINE W REFLEX MICROSCOPIC
Bilirubin Urine: NEGATIVE
Glucose, UA: NEGATIVE mg/dL
Hgb urine dipstick: NEGATIVE
Ketones, ur: NEGATIVE mg/dL
Nitrite: NEGATIVE
Specific Gravity, Urine: 1.015 (ref 1.005–1.030)
pH: 6.5 (ref 5.0–8.0)

## 2023-05-15 LAB — CBC
HCT: 38 % (ref 36.0–46.0)
Hemoglobin: 12.1 g/dL (ref 12.0–15.0)
MCH: 30.6 pg (ref 26.0–34.0)
MCHC: 31.8 g/dL (ref 30.0–36.0)
MCV: 96 fL (ref 80.0–100.0)
Platelets: 397 10*3/uL (ref 150–400)
RBC: 3.96 MIL/uL (ref 3.87–5.11)
RDW: 12.7 % (ref 11.5–15.5)
WBC: 15.6 10*3/uL — ABNORMAL HIGH (ref 4.0–10.5)
nRBC: 0 % (ref 0.0–0.2)

## 2023-05-15 LAB — LIPASE, BLOOD: Lipase: 24 U/L (ref 11–51)

## 2023-05-15 MED ORDER — DOXYCYCLINE HYCLATE 100 MG PO TABS
100.0000 mg | ORAL_TABLET | Freq: Once | ORAL | Status: AC
  Administered 2023-05-16: 100 mg via ORAL
  Filled 2023-05-15: qty 1

## 2023-05-15 NOTE — ED Provider Notes (Signed)
[place] EMERGENCY DEPARTMENT AT [facility] Provider Note   CSN: [id number] Arrival date & time: 05/15/23  2148     History  No chief complaint on file.   Donna Williamson is a 77 y.o. female.  Patient is a 77 year old female with past medical history of coronary artery disease with stent, hypertension, GERD, anxiety.  Patient presenting today with complaints of fever.  At approximately 2 PM, she began to feel chilled, then checked her temperature and it was 102.4.  She called the on-call provider who advised her to come to the ER to be checked out.  Patient otherwise has no symptoms.  She did not take anything for the fever, but arrives here afebrile with temp of 99.6.  The history is provided by the patient.       Home Medications Prior to Admission medications   Medication Sig Start Date End Date Taking? Authorizing Provider  albuterol (PROVENTIL) (2.5 MG/3ML) 0.083% nebulizer solution Take 3 mLs (2.5 mg total) by nebulization every 2 (two) hours as needed for wheezing or shortness of breath. 05/04/16   Kathlen Mody, MD  ALPRAZolam Prudy Feeler) 0.5 MG tablet Take 0.5 mg by mouth daily as needed for anxiety.     [provider]  aspirin EC 81 MG tablet Take 81 mg by mouth daily.    [provider]  budesonide-formoterol (SYMBICORT) 160-4.5 MCG/ACT inhaler Inhale 2 puffs into the lungs 2 (two) times daily.    [provider]  carboxymethylcellulose (REFRESH PLUS) 0.5 % SOLN Place 1 drop into both eyes daily as needed (dry eyes).    [provider]  Cholecalciferol (VITAMIN D3) 2000 UNITS TABS Take 2,000 Units by mouth daily.    [provider]  clobetasol ointment (TEMOVATE) 0.05 % Apply topically. 01/28/22   [provider]  cyanocobalamin 500 MCG tablet Take 1,000 mcg by mouth daily.    [provider]  dapagliflozin propanediol (FARXIGA) 10 MG TABS tablet Take 1 tablet (10 mg total) by mouth daily before  breakfast. 03/08/23   Tereso Newcomer T, PA-C  dapagliflozin propanediol (FARXIGA) 5 MG TABS tablet Take 1 tablet (5 mg total) by mouth daily before breakfast. 03/29/23   Tereso Newcomer T, PA-C  diltiazem (CARDIZEM CD) 240 MG 24 hr capsule Take 1 capsule (240 mg total) by mouth daily. 03/08/21   Filbert Schilder, NP  EPIPEN 2-PAK 0.3 MG/0.3ML SOAJ injection Inject 0.3 mg into the skin daily as needed (allergic reaction).  11/01/13   [provider]  estradiol (ESTRACE) 0.5 MG tablet Take 0.5 mg by mouth daily.    [provider]  ezetimibe (ZETIA) 10 MG tablet Take 10 mg by mouth daily.    [provider]  FLUoxetine (PROZAC) 10 MG capsule Take 10 mg by mouth daily. 02/21/22   [provider]  fluticasone (FLONASE) 50 MCG/ACT nasal spray Place 2 sprays into the nose daily.    [provider]  furosemide (LASIX) 20 MG tablet Take 1 tablet (20 mg total) by mouth daily. 01/31/23   Tereso Newcomer T, PA-C  gabapentin (NEURONTIN) 100 MG capsule Take 100 mg by mouth at bedtime.     [provider]  inclisiran (LEQVIO) 284 MG/1.5ML SOSY injection Inject 284 mg into the skin once.    [provider]  isosorbide mononitrate (IMDUR) 30 MG 24 hr tablet TAKE 1 TABLET(30 MG) BY MOUTH DAILY 02/13/23   Tereso Newcomer T, PA-C  metoprolol tartrate (LOPRESSOR) 25 MG tablet TAKE 1/2  TABLET(12.5 MG) BY MOUTH TWICE DAILY 03/15/23   Tonny Bollman, MD  montelukast (SINGULAIR) 10 MG tablet Take 10 mg by mouth at bedtime.    [provider]  Multiple Vitamin (MULTIVITAMIN WITH MINERALS) TABS tablet Take 1 tablet by mouth daily.    [provider]  nitroGLYCERIN (NITROSTAT) 0.4 MG SL tablet Place 1 tablet (0.4 mg total) under the tongue every 5 (five) minutes as needed for chest pain (up to 3 doses. If taking 3rd dose call 911). 02/04/22   Tereso Newcomer T, PA-C  pantoprazole (PROTONIX) 40 MG tablet TAKE 2 TABLETS(80 MG) BY MOUTH DAILY 04/27/23   Tonny Bollman, MD   potassium chloride (KLOR-CON) 10 MEQ tablet Take 10 mEq by mouth 2 (two) times a week. Take 1 tablet 2 times a week 05/09/22   [provider]  PROAIR HFA 108 (90 BASE) MCG/ACT inhaler Inhale 1-2 puffs into the lungs every 4 (four) hours as needed for wheezing. 11/01/13   [provider]  rosuvastatin (CRESTOR) 20 MG tablet Take 1 tablet (20 mg total) by mouth daily at 6 PM. 05/30/16   Bhagat, [place], PA      Allergies    Levofloxacin, Plavix [clopidogrel bisulfate], Ciprofibrate, Codeine, Drug ingredient [ticagrelor], Nitrofurantoin, Ciprofloxacin, Penicillins, and Sulfa antibiotics    Review of Systems   Review of Systems  All other systems reviewed and are negative.   Physical Exam Updated Vital Signs BP (!) 156/70   Pulse 86   Temp 99.6 F (37.6 C)   Resp 19   Ht 5\' 4"  (1.626 m)   Wt 101.2 kg   SpO2 94%   BMI 38.28 kg/m  Physical Exam Vitals and nursing note reviewed.  Constitutional:      General: She is not in acute distress.    Appearance: She is well-developed. She is not diaphoretic.  HENT:     Head: Normocephalic and atraumatic.  Cardiovascular:     Rate and Rhythm: Normal rate and regular rhythm.     Heart sounds: No murmur heard.    No friction rub. No gallop.  Pulmonary:     Effort: Pulmonary effort is normal. No respiratory distress.     Breath sounds: Normal breath sounds. No wheezing.  Abdominal:     General: Bowel sounds are normal. There is no distension.     Palpations: Abdomen is soft.     Tenderness: There is no abdominal tenderness.  Musculoskeletal:        General: Normal range of motion.     Cervical back: Normal range of motion and neck supple.  Skin:    General: Skin is warm and dry.  Neurological:     General: No focal deficit present.     Mental Status: She is alert and oriented to person, place, and time.     ED Results / Procedures / Treatments   Labs (all labs ordered are listed, but only abnormal results are  displayed) Labs Reviewed  COMPREHENSIVE METABOLIC PANEL - Abnormal; Notable for the following components:      Result Value   Glucose, Bld 113 (*)    Creatinine, Ser 1.14 (*)    GFR, Estimated 49 (*)    All other components within normal limits  CBC - Abnormal; Notable for the following components:   WBC 15.6 (*)    All other components within normal limits  URINALYSIS, ROUTINE W REFLEX MICROSCOPIC - Abnormal; Notable for the following components:   APPearance HAZY (*)    Protein,  ur TRACE (*)    Leukocytes,Ua MODERATE (*)    Bacteria, UA FEW (*)    All other components within normal limits  RESP PANEL BY RT-PCR (RSV, FLU A&B, COVID)  RVPGX2  CULTURE, BLOOD (ROUTINE X 2)  CULTURE, BLOOD (ROUTINE X 2)  LIPASE, BLOOD    EKG None  Radiology No results found.  Procedures Procedures    Medications Ordered in ED Medications  doxycycline (VIBRA-TABS) tablet 100 mg (has no administration in time range)    ED Course/ Medical Decision Making/ A&P  Patient with history of coronary artery disease with stents, hypertension presenting with complaints of fever as described in the HPI.  She arrives here with stable vital signs and temp of 99.6.  Physical examination basically unremarkable.  Patient is without complaints otherwise.  Workup initiated including CBC, metabolic panel, respiratory panel, and UTI.  She has a leukocytosis with white count of 15.6, but laboratory studies otherwise unremarkable.  COVID/flu/RSV all negative.  UA suggestive of a UTI.  As patient has multiple drug allergies, I will prescribe doxycycline.  Blood cultures will also be obtained and patient will be notified if these turn positive and require further treatment.  She otherwise seems appropriate for discharge to home.  Final Clinical Impression(s) / ED Diagnoses Final diagnoses:  None    Rx / DC Orders ED Discharge Orders     None         Geoffery Lyons, MD 05/16/23 0001

## 2023-05-15 NOTE — ED Notes (Signed)
Patient's SpO2 88-90% on Room Air. Patient states she had a sleep study performed and required a CPAP. Patient states she is unable to tolerate CPAP. Patient placed on 2L [place].

## 2023-05-15 NOTE — ED Triage Notes (Signed)
Patient here POV from Home.  Endorses Fever that began today. Noted some Headache and ABD Discomfort. 102.4 Highest. No Other Symptoms such as Cough, Dysuria, Sore Throat, Aches, N/V/D.   NAD noted during Triage. A&Ox4. GCS 15. Ambulatory.

## 2023-05-15 NOTE — ED Notes (Signed)
Patient oxygen saturation is 88%, Respiratory is at bedside. Patient placed on 2L [place].

## 2023-05-16 MED ORDER — DOXYCYCLINE HYCLATE 100 MG PO CAPS: 100.0000 mg | ORAL_CAPSULE | Freq: Two times a day (BID) | ORAL | 0 refills | Status: DC

## 2023-05-16 NOTE — ED Notes (Signed)
This nurse attempted blood cultures twice, only getting enough blood for 1 set. Patient refuses to be stuck again. Dr. Judd Lien notified. Dr. Judd Lien okay with 1 set of cultures at this time.

## 2023-05-16 NOTE — Discharge Instructions (Signed)
Begin taking doxycycline as prescribed.  Take Tylenol 1000 mg rotated with ibuprofen 600 mg every 4 hours as needed for fever.  We will call you if your cultures indicate you require further treatment or need to take additional action.

## 2023-05-17 DIAGNOSIS — R509 Fever, unspecified: Secondary | ICD-10-CM | POA: Diagnosis not present

## 2023-05-17 DIAGNOSIS — D72825 Bandemia: Secondary | ICD-10-CM | POA: Diagnosis not present

## 2023-05-17 DIAGNOSIS — J4541 Moderate persistent asthma with (acute) exacerbation: Secondary | ICD-10-CM | POA: Diagnosis not present

## 2023-05-17 DIAGNOSIS — R918 Other nonspecific abnormal finding of lung field: Secondary | ICD-10-CM | POA: Diagnosis not present

## 2023-05-18 ENCOUNTER — Ambulatory Visit
Admission: RE | Admit: 2023-05-18 | Discharge: 2023-05-18 | Disposition: A | Discharge status: Home / Self Care | Payer: Medicare PPO | Source: Ambulatory Visit | Attending: Adult Health | Admitting: Adult Health

## 2023-05-18 ENCOUNTER — Other Ambulatory Visit: Payer: Self-pay | Admitting: Adult Health

## 2023-05-18 DIAGNOSIS — R509 Fever, unspecified: Secondary | ICD-10-CM

## 2023-05-18 DIAGNOSIS — I2510 Atherosclerotic heart disease of native coronary artery without angina pectoris: Secondary | ICD-10-CM | POA: Diagnosis not present

## 2023-05-18 DIAGNOSIS — D72825 Bandemia: Secondary | ICD-10-CM

## 2023-05-18 DIAGNOSIS — R918 Other nonspecific abnormal finding of lung field: Secondary | ICD-10-CM

## 2023-05-18 DIAGNOSIS — J8410 Pulmonary fibrosis, unspecified: Secondary | ICD-10-CM | POA: Diagnosis not present

## 2023-05-18 DIAGNOSIS — I700 Atherosclerosis of aorta: Secondary | ICD-10-CM | POA: Diagnosis not present

## 2023-05-19 DIAGNOSIS — J8410 Pulmonary fibrosis, unspecified: Secondary | ICD-10-CM | POA: Diagnosis not present

## 2023-05-19 DIAGNOSIS — I700 Atherosclerosis of aorta: Secondary | ICD-10-CM | POA: Diagnosis not present

## 2023-05-19 DIAGNOSIS — D72825 Bandemia: Secondary | ICD-10-CM | POA: Diagnosis not present

## 2023-05-19 DIAGNOSIS — J84112 Idiopathic pulmonary fibrosis: Secondary | ICD-10-CM | POA: Diagnosis not present

## 2023-05-19 DIAGNOSIS — I517 Cardiomegaly: Secondary | ICD-10-CM | POA: Diagnosis not present

## 2023-05-20 LAB — CULTURE, BLOOD (ROUTINE X 2)

## 2023-05-21 LAB — CULTURE, BLOOD (ROUTINE X 2): Culture: NO GROWTH

## 2023-05-29 ENCOUNTER — Telehealth: Payer: Self-pay | Admitting: *Deleted

## 2023-05-29 DIAGNOSIS — J189 Pneumonia, unspecified organism: Secondary | ICD-10-CM | POA: Diagnosis not present

## 2023-05-29 DIAGNOSIS — J84112 Idiopathic pulmonary fibrosis: Secondary | ICD-10-CM | POA: Diagnosis not present

## 2023-05-29 DIAGNOSIS — J8410 Pulmonary fibrosis, unspecified: Secondary | ICD-10-CM | POA: Diagnosis not present

## 2023-05-29 DIAGNOSIS — D72825 Bandemia: Secondary | ICD-10-CM | POA: Diagnosis not present

## 2023-05-29 DIAGNOSIS — R0602 Shortness of breath: Secondary | ICD-10-CM | POA: Diagnosis not present

## 2023-05-29 NOTE — Telephone Encounter (Signed)
Transition Care Management Unsuccessful Follow-up Telephone Call  Date of discharge and from where:  [facility]  05/16/2023  Attempts:  1st Attempt  Reason for unsuccessful TCM follow-up call:

## 2023-06-08 ENCOUNTER — Telehealth: Payer: Self-pay | Admitting: Physician Assistant

## 2023-06-08 DIAGNOSIS — J45909 Unspecified asthma, uncomplicated: Secondary | ICD-10-CM | POA: Diagnosis not present

## 2023-06-08 DIAGNOSIS — N1832 Chronic kidney disease, stage 3b: Secondary | ICD-10-CM | POA: Diagnosis not present

## 2023-06-08 DIAGNOSIS — J84112 Idiopathic pulmonary fibrosis: Secondary | ICD-10-CM | POA: Diagnosis not present

## 2023-06-08 DIAGNOSIS — I252 Old myocardial infarction: Secondary | ICD-10-CM | POA: Diagnosis not present

## 2023-06-08 DIAGNOSIS — I129 Hypertensive chronic kidney disease with stage 1 through stage 4 chronic kidney disease, or unspecified chronic kidney disease: Secondary | ICD-10-CM | POA: Diagnosis not present

## 2023-06-08 DIAGNOSIS — E785 Hyperlipidemia, unspecified: Secondary | ICD-10-CM | POA: Diagnosis not present

## 2023-06-08 DIAGNOSIS — J189 Pneumonia, unspecified organism: Secondary | ICD-10-CM | POA: Diagnosis not present

## 2023-06-08 NOTE — Telephone Encounter (Signed)
Patient states she was diagnosed with pneumonia last week and wanted to know if she should come in for her appointment tomorrow. 06/09/23. She states she has finished her antibiotics and she has been afebrile.   Advised patient there is no reason not to come if she feels well enough. Patient states she will plan on keeping her appointment.

## 2023-06-08 NOTE — Progress Notes (Addendum)
Cardiology Office Note:    Date:  06/09/2023  ID:  Donna Williamson, Donna Williamson 05/16/1946, MRN [id number] PCP: Rodrigo Ran, MD  [facility] Providers Cardiologist:  Tonny Bollman, MD       Patient Profile:      Coronary artery disease  Intol to Brilinta (epistaxis); Allergy to Plavix  NSTEMI 04/2016 s/p DES to OM1 Myoview 3/21: low risk [place] 01/2021 s/p 2.5 x 16 mm DES to LAD and POBA of D2  Residual CAD on cath 02/17/21: pLAD 40, pLCx 40, OM1 stent patent Effient Rx due to allergy to Brilinta and Plavix  Myoview 02/08/2023: Normal perfusion, EF 51 (HFpEF) heart failure with preserved ejection fraction  TTE 02/18/2021: EF 60-65  TTE 02/28/2023: EF 55-60, no RWMA, mild asymmetric LVH, NL RVSF, mild MR, mild aortic stenosis (bulky subvalvular calcification), mean AV gradient 9.6, V-max 199 cm/s, RAP 3 Mild aortic stenosis  Parox Supraventricular Tachycardia  Monitor 06/2019: NSR avg HR 67, short SVT (8-10 beats), PVCs < 1% burden  Hypertension  Hyperlipidemia  Chronic kidney disease stage III Chronic Obstructive Pulmonary Disease Pulmonary fibrosis  GERD Sjogren's syndrome  Sleep apnea  Carotid US 12/2013: Normal bilateral carotid arteries            Discussed the use of AI scribe software for clinical note transcription with the patient, who gave verbal consent to proceed. History of Present Illness   A 77 year old patient with a history of CAD and CHF presents for follow-up. An echocardiogram in April 2024 showed an ejection fraction of 55-60, mild mitral regurgitation, and mild aortic stenosis. Recently, the patient was diagnosed with community-acquired pneumonia and treated with antibiotics. Despite completing the course of antibiotics, the patient reports a persistent cough. A Chest CT scan performed in July 2024 revealed mild pulmonary fibrosis and findings suggestive of usual interstitial pneumonitis.  The patient is here alone today.  She notes intolerance to [place]. She  experienced headaches and nausea while on the medication, which persisted even after the dose was halved. These symptoms resolved after discontinuation of the medication.  She has not had chest pain.  Shortness of breath is stable.  She has not had orthopnea significant lower extremity edema or syncope.     ROS:  See HPI    Studies Reviewed:        Risk Assessment/Calculations:         STOP-Bang Score:  6      Physical Exam:   VS:  BP 136/62   Pulse 72   Ht 5\' 5"  (1.651 m)   Wt 219 lb (99.3 kg)   SpO2 93%   BMI 36.44 kg/m    Wt Readings from Last 3 Encounters:  06/09/23 219 lb (99.3 kg)  05/15/23 223 lb (101.2 kg)  03/08/23 223 lb (101.2 kg)    GEN: Well nourished, well developed in no acute distress NECK: No JVD CARDIAC: RRR, no murmurs, rubs, gallops RESPIRATORY: Faint wheeze in the right lower lobe otherwise no rales ABDOMEN: Soft EXTREMITIES:  No edema     Assessment and Plan:  Chronic heart failure with preserved ejection fraction (HCC) Intolerance to [place]. Currently managed with Lasix 20mg  daily. No current volume overload. -Continue Lasix 20mg  daily. Consider addition of spironolactone if volume management becomes an issue. -Keep follow-up with Dr. Excell Seltzer in October as planned.  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 No current chest pain or anginal symptoms. -Continue aspirin 81mg  daily, Cardizem 240mg  daily, Zetia 10mg  daily, Leqvio twice yearly, Imdur 30mg  daily, Metoprolol tartrate 12.5mg  twice daily, Nitroglycerin as needed, and Crestor 20mg  daily. -She prefers to keep follow-up with Dr. Excell Seltzer in October as planned.  Mild aortic stenosis Identified on April 2024 echocardiogram. -Plan to repeat echocardiogram in 2-3 years.  Essential hypertension Well-controlled on current  regimen. -Continue Cardizem CD 240mg  daily, Lasix 20mg  daily, Imdur 30mg  daily, Metoprolol tartrate 12.5mg  twice daily.  Hyperlipidemia LDL goal <70 Managed by primary care.  Goal LDL <55. Managed by primary care. -Continue Leqvio twice yearly, Crestor 20mg  daily, Zetia 10mg  daily.  -Request most recent labs. ADDENDUM 06/20/2023: Labs from PCP reviewed. 11/14/22: LDL 66. Slightly above goal. But pt on 3 meds. Would continue current Rx for now.  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 Recent history of community-acquired pneumonia. Persistent cough despite completion of antibiotics.  -She will see pulmonology on 07/27/2023.     Dispo:  Return in 2 months (on 08/21/2023) for Scheduled Follow Up, w/ Dr. Excell Seltzer.  Signed, Tereso Newcomer, PA-C

## 2023-06-08 NOTE — Telephone Encounter (Signed)
Pt has an office visit tomorrow but called stating she's just recovering from Pneumonia and would like a callback from the nurse to see if she should still come in or not before canceling this appt. Please advise

## 2023-06-09 ENCOUNTER — Telehealth: Payer: Self-pay | Admitting: *Deleted

## 2023-06-09 ENCOUNTER — Encounter: Payer: Self-pay | Admitting: Physician Assistant

## 2023-06-09 ENCOUNTER — Ambulatory Visit: Payer: Medicare PPO | Attending: Physician Assistant | Admitting: Physician Assistant

## 2023-06-09 VITALS — BP 136/62 | HR 72 | Ht 65.0 in | Wt 219.0 lb

## 2023-06-09 DIAGNOSIS — I350 Nonrheumatic aortic (valve) stenosis: Secondary | ICD-10-CM

## 2023-06-09 DIAGNOSIS — I10 Essential (primary) hypertension: Secondary | ICD-10-CM

## 2023-06-09 DIAGNOSIS — J8410 Pulmonary fibrosis, unspecified: Secondary | ICD-10-CM | POA: Diagnosis not present

## 2023-06-09 DIAGNOSIS — I25119 Atherosclerotic heart disease of native coronary artery with unspecified angina pectoris: Secondary | ICD-10-CM | POA: Diagnosis not present

## 2023-06-09 DIAGNOSIS — I5032 Chronic diastolic (congestive) heart failure: Secondary | ICD-10-CM | POA: Diagnosis not present

## 2023-06-09 DIAGNOSIS — E785 Hyperlipidemia, unspecified: Secondary | ICD-10-CM

## 2023-06-09 NOTE — Assessment & Plan Note (Signed)
Identified on April 2024 echocardiogram. -Plan to repeat echocardiogram in 2-3 years.

## 2023-06-09 NOTE — Assessment & Plan Note (Signed)
History of non-STEMI treated with DES to OM1 in 2017 and DES to LAD, balloon angioplasty to the second diagonal in April 2022. No current chest pain or anginal symptoms. -Continue aspirin 81mg  daily, Cardizem 240mg  daily, Zetia 10mg  daily, Leqvio twice yearly, Imdur 30mg  daily, Metoprolol tartrate 12.5mg  twice daily, Nitroglycerin as needed, and Crestor 20mg  daily. -She prefers to keep follow-up with Dr. Excell Seltzer in October as planned.

## 2023-06-09 NOTE — Telephone Encounter (Signed)
Lvm for Dondra Spry at Dr. Laurey Morale office @ [phone] to send recent lipid results to same day fax number att Scott.

## 2023-06-09 NOTE — Assessment & Plan Note (Signed)
Intolerance to [place]. Currently managed with Lasix 20mg  daily. No current volume overload. -Continue Lasix 20mg  daily. Consider addition of spironolactone if volume management becomes an issue. -Keep follow-up with Dr. Excell Seltzer in October as planned.

## 2023-06-09 NOTE — Assessment & Plan Note (Signed)
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 Recent history of community-acquired pneumonia. Persistent cough despite completion of antibiotics.  -She will see pulmonology on 07/27/2023.

## 2023-06-09 NOTE — Assessment & Plan Note (Signed)
Managed by primary care.  Goal LDL <55. Managed by primary care. -Continue Leqvio twice yearly, Crestor 20mg  daily, Zetia 10mg  daily.  -Request most recent labs.

## 2023-06-09 NOTE — Assessment & Plan Note (Signed)
Well-controlled on current regimen. -Continue Cardizem CD 240mg  daily, Lasix 20mg  daily, Imdur 30mg  daily, Metoprolol tartrate 12.5mg  twice daily.

## 2023-06-09 NOTE — Patient Instructions (Signed)
Medication Instructions:   Your physician recommends that you continue on your current medications as directed. Please refer to the Current Medication list given to you today.   *If you need a refill on your cardiac medications before your next appointment, please call your pharmacy*   Lab Work:  None ordered.  If you have labs (blood work) drawn today and your tests are completely normal, you will receive your results only by: MyChart Message (if you have MyChart) OR A paper copy in the mail If you have any lab test that is abnormal or we need to change your treatment, we will call you to review the results.   Testing/Procedures:  None ordered.   Follow-Up: At [facility], you and your health needs are our priority.  As part of our continuing mission to provide you with exceptional heart care, we have created designated Provider Care Teams.  These Care Teams include your primary Cardiologist (physician) and Advanced Practice Providers (APPs -  Physician Assistants and Nurse Practitioners) who all work together to provide you with the care you need, when you need it.  We recommend signing up for the patient portal called "MyChart".  Sign up information is provided on this After Visit Summary.  MyChart is used to connect with patients for Virtual Visits (Telemedicine).  Patients are able to view lab/test results, encounter notes, upcoming appointments, etc.  Non-urgent messages can be sent to your provider as well.   To learn more about what you can do with MyChart, go to [email or web address].    Your next appointment:   2 month(s)  Provider:   Tonny Bollman, MD

## 2023-06-12 NOTE — Telephone Encounter (Signed)
Received faxed labs from PCP.

## 2023-07-05 ENCOUNTER — Encounter: Payer: Self-pay | Admitting: Internal Medicine

## 2023-07-11 DIAGNOSIS — N1832 Chronic kidney disease, stage 3b: Secondary | ICD-10-CM | POA: Diagnosis not present

## 2023-07-11 DIAGNOSIS — J8410 Pulmonary fibrosis, unspecified: Secondary | ICD-10-CM | POA: Diagnosis not present

## 2023-07-11 DIAGNOSIS — R0981 Nasal congestion: Secondary | ICD-10-CM | POA: Diagnosis not present

## 2023-07-11 DIAGNOSIS — J309 Allergic rhinitis, unspecified: Secondary | ICD-10-CM | POA: Diagnosis not present

## 2023-07-11 DIAGNOSIS — J189 Pneumonia, unspecified organism: Secondary | ICD-10-CM | POA: Diagnosis not present

## 2023-07-11 DIAGNOSIS — Z23 Encounter for immunization: Secondary | ICD-10-CM | POA: Diagnosis not present

## 2023-07-11 DIAGNOSIS — R051 Acute cough: Secondary | ICD-10-CM | POA: Diagnosis not present

## 2023-07-11 DIAGNOSIS — J45909 Unspecified asthma, uncomplicated: Secondary | ICD-10-CM | POA: Diagnosis not present

## 2023-07-11 DIAGNOSIS — J84112 Idiopathic pulmonary fibrosis: Secondary | ICD-10-CM | POA: Diagnosis not present

## 2023-07-11 DIAGNOSIS — I129 Hypertensive chronic kidney disease with stage 1 through stage 4 chronic kidney disease, or unspecified chronic kidney disease: Secondary | ICD-10-CM | POA: Diagnosis not present

## 2023-07-12 DIAGNOSIS — I129 Hypertensive chronic kidney disease with stage 1 through stage 4 chronic kidney disease, or unspecified chronic kidney disease: Secondary | ICD-10-CM | POA: Diagnosis not present

## 2023-07-12 DIAGNOSIS — N1832 Chronic kidney disease, stage 3b: Secondary | ICD-10-CM | POA: Diagnosis not present

## 2023-07-12 DIAGNOSIS — E785 Hyperlipidemia, unspecified: Secondary | ICD-10-CM | POA: Diagnosis not present

## 2023-07-12 DIAGNOSIS — R7301 Impaired fasting glucose: Secondary | ICD-10-CM | POA: Diagnosis not present

## 2023-07-19 DIAGNOSIS — S76311A Strain of muscle, fascia and tendon of the posterior muscle group at thigh level, right thigh, initial encounter: Secondary | ICD-10-CM | POA: Diagnosis not present

## 2023-07-27 ENCOUNTER — Ambulatory Visit: Payer: Medicare PPO | Admitting: Internal Medicine

## 2023-07-27 ENCOUNTER — Encounter: Payer: Self-pay | Admitting: Internal Medicine

## 2023-07-27 VITALS — BP 124/74 | HR 69 | Ht 65.0 in | Wt 219.0 lb

## 2023-07-27 DIAGNOSIS — Z8679 Personal history of other diseases of the circulatory system: Secondary | ICD-10-CM

## 2023-07-27 DIAGNOSIS — J849 Interstitial pulmonary disease, unspecified: Secondary | ICD-10-CM | POA: Diagnosis not present

## 2023-07-27 DIAGNOSIS — R0609 Other forms of dyspnea: Secondary | ICD-10-CM

## 2023-07-27 LAB — BRAIN NATRIURETIC PEPTIDE: Pro B Natriuretic peptide (BNP): 118 pg/mL — ABNORMAL HIGH (ref 0.0–100.0)

## 2023-07-27 LAB — SEDIMENTATION RATE: Sed Rate: 80 mm/hr — ABNORMAL HIGH (ref 0–30)

## 2023-07-27 NOTE — Progress Notes (Signed)
OV 07/27/2023  Subjective:  Patient ID: Donna Williamson, female , DOB: 04-01-1946 , age 77 y.o. , MRN: [id number] , ADDRESS: [address] [place] [address] PCP Rodrigo Ran, MD Patient Care Team: Rodrigo Ran, MD as PCP - General (Internal Medicine) Tonny Bollman, MD as PCP - Cardiology (Cardiology)  This Provider for this visit: Treatment Team:  Attending Provider: Nyoka Cowden, MD    07/27/2023 -   Chief Complaint  Patient presents with   Pulmonary Consult    Referred by Dr. Rodrigo Ran. Pt c/o DOE over many years, worse x 2 years. She gets winded "most anything I do".  She has cough in the am- prod with white to yellow sputum, then dry cough throughout the day.      HPI Donna Williamson 77 y.o. -presents with her husband.  Husband is an independent historian.  The concern is for interstitial lung disease.  Is a 77 year old female suffers from obesity.  She says in 2017 she had a myocardial infarction.  I visualized the CT scan of the chest from the time she did have pleural effusion it was a angiogram protocol to rule out pulmonary embolism which she did rule out.  She had pleural effusions and bilateral lower lobe inflammation at that time..  She states after that she felt completely like a new person.  Then appears in 2019 or 2020 she did have stents and angioplasty but was doing fairly okay but then in the last few years has worsening insidious onset of shortness of breath that is slowly getting worse present with exertion relieved by rest.  She believes she might be desaturating [walk 200 feet here and pulse ox went down to 90%].  She says just walking from her kitchen to the living room makes her shortness of breath come on.  She is also got nonproductive cough indigestion and sometimes sore throat.  In early 2024 she was told she had pneumonia with scar tissue then by July 2024 chest x-ray showed resolution of the pneumonia but the scar tissue was still  there but then they did a high-resolution CT chest on her insistence.  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  There is reticulation there is no honeycombing.  Definite craniocaudal appearance but it seems more postinflammatory.  She is known to suffer from diastolic heart failure she has multiple episodes of high BNP.  She is managed by Dr. Excell Seltzer  She also sees Dr. Carolanne Grumbling for sleep apnea and is using CPAP.    CT Chest data from date: *718/24  - personally visualized and independently interpreted : ys - my findings are: as dictated with Narrative & Impression  CLINICAL DATA:  Abnormal chest radiograph   EXAM: CT CHEST WITHOUT CONTRAST   TECHNIQUE: Multidetector CT imaging of the chest was performed following the standard protocol without IV contrast.   RADIATION DOSE REDUCTION: This exam was performed according to the departmental dose-optimization program which includes automated exposure control, adjustment of the mA and/or kV according to patient size and/or use of iterative reconstruction technique.   COMPARISON:  None Available.   FINDINGS: Cardiovascular: Aortic atherosclerosis. Mild cardiomegaly. Extensive three-vessel coronary artery calcifications no pericardial effusion.   Mediastinum/Nodes: No enlarged mediastinal, hilar, or axillary lymph nodes. Thyroid gland, trachea, and esophagus demonstrate no significant findings.   Lungs/Pleura: Mild pulmonary fibrosis in a pattern with apical to  basal gradient, however with heterogeneous areas involvement, for example in the right upper lobe, featuring irregular peripheral interstitial opacity, septal thickening, mild traction bronchiectasis in the lung bases, as well as small areas of subpleural bronchiolectasis without clear evidence of honeycombing. No pleural effusion or pneumothorax.   Upper Abdomen: No acute abnormality.    Musculoskeletal: No chest wall abnormality. No acute osseous findings.   IMPRESSION: 1. Mild pulmonary fibrosis in a pattern with apical to basal gradient, however with heterogeneous areas involvement, featuring irregular peripheral interstitial opacity, septal thickening, mild traction bronchiectasis in the lung bases, as well as small areas of subpleural bronchiolectasis without clear evidence of honeycombing. 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 Am Rosezetta Schlatter Crit Care Med Vol 198, Iss 5, [phone], Jul 01 2017. 2. Coronary artery disease. 3. Aortic atherosclerosis.   Aortic Atherosclerosis (ICD10-I70.0).     Electronically Signed   By: Jearld Lesch M.D.   On: 05/18/2023 15:16    PFT      No data to display            LAB RESULTS last 96 hours No results found.  LAB RESULTS last 90 days Recent Results (from the past 2160 hour(s))  Lipase, blood     Status: None   Collection Time: 05/15/23 10:04 PM  Result Value Ref Range   Lipase 24 11 - 51 U/L    Comment: Performed at [facility], [address], [place], [place] [address]  Comprehensive metabolic panel     Status: Abnormal   Collection Time: 05/15/23 10:04 PM  Result Value Ref Range   Sodium 136 135 - 145 mmol/L   Potassium 4.1 3.5 - 5.1 mmol/L   Chloride 102 98 - 111 mmol/L   CO2 22 22 - 32 mmol/L   Glucose, Bld 113 (H) 70 - 99 mg/dL    Comment: Glucose reference range applies only to samples taken after fasting for at least 8 hours.   BUN 20 8 - 23 mg/dL   Creatinine, Ser 0.27 (H) 0.44 - 1.00 mg/dL   Calcium 9.2 8.9 - 25.3 mg/dL   Total Protein 7.6 6.5 - 8.1 g/dL   Albumin 4.2 3.5 - 5.0 g/dL   AST 27 15 - 41 U/L   ALT 13 0 - 44 U/L   Alkaline Phosphatase 88 38 - 126 U/L   Total Bilirubin 0.3 0.3 - 1.2 mg/dL   GFR, Estimated 49 (L) >60 mL/min     Comment: (NOTE) Calculated using the CKD-EPI Creatinine Equation (2021)    Anion gap 12 5 - 15    Comment: Performed at [facility], [address], [place], [place] [address]  CBC     Status: Abnormal   Collection Time: 05/15/23 10:04 PM  Result Value Ref Range   WBC 15.6 (H) 4.0 - 10.5 K/uL   RBC 3.96 3.87 - 5.11 MIL/uL   Hemoglobin 12.1 12.0 - 15.0 g/dL   HCT 34.7 42.5 - 95.6 %   MCV 96.0 80.0 - 100.0 fL   MCH 30.6 26.0 - 34.0 pg   MCHC 31.8 30.0 - 36.0 g/dL   RDW 38.7 56.4 - 33.2 %   Platelets 397 150 - 400 K/uL   nRBC 0.0 0.0 - 0.2 %    Comment: Performed at [facility], [address], [place], [place] [address]  Resp panel by RT-PCR (RSV, Flu A&B, Covid) Anterior Nasal  Swab     Status: None   Collection Time: 05/15/23 10:04 PM   Specimen: Anterior Nasal Swab  Result Value Ref Range   SARS Coronavirus 2 by RT PCR NEGATIVE NEGATIVE    Comment: (NOTE) SARS-CoV-2 target nucleic acids are NOT DETECTED.  The SARS-CoV-2 RNA is generally detectable in upper respiratory specimens during the acute phase of infection. The lowest concentration of SARS-CoV-2 viral copies this assay can detect is 138 copies/mL. A negative result does not preclude SARS-Cov-2 infection and should not be used as the sole basis for treatment or other patient management decisions. A negative result may occur with  improper specimen collection/handling, submission of specimen other than nasopharyngeal swab, presence of viral mutation(s) within the areas targeted by this assay, and inadequate number of viral copies(<138 copies/mL). A negative result must be combined with clinical observations, patient history, and epidemiological information. The expected result is Negative.  Fact Sheet for Patients:  BloggerCourse.com  Fact Sheet for Healthcare Providers:  SeriousBroker.it  This test is no t yet approved or  cleared by the [place] FDA and  has been authorized for detection and/or diagnosis of SARS-CoV-2 by FDA under an Emergency Use Authorization (EUA). This EUA will remain  in effect (meaning this test can be used) for the duration of the COVID-19 declaration under Section 564(b)(1) of the Act, 21 U.S.C.section 360bbb-3(b)(1), unless the authorization is terminated  or revoked sooner.       Influenza A by PCR NEGATIVE NEGATIVE   Influenza B by PCR NEGATIVE NEGATIVE    Comment: (NOTE) The Xpert Xpress SARS-CoV-2/FLU/RSV plus assay is intended as an aid in the diagnosis of influenza from Nasopharyngeal swab specimens and should not be used as a sole basis for treatment. Nasal washings and aspirates are unacceptable for Xpert Xpress SARS-CoV-2/FLU/RSV testing.  Fact Sheet for Patients: BloggerCourse.com  Fact Sheet for Healthcare Providers: SeriousBroker.it  This test is not yet approved or cleared by the [place] FDA and has been authorized for detection and/or diagnosis of SARS-CoV-2 by FDA under an Emergency Use Authorization (EUA). This EUA will remain in effect (meaning this test can be used) for the duration of the COVID-19 declaration under Section 564(b)(1) of the Act, 21 U.S.C. section 360bbb-3(b)(1), unless the authorization is terminated or revoked.     Resp Syncytial Virus by PCR NEGATIVE NEGATIVE    Comment: (NOTE) Fact Sheet for Patients: BloggerCourse.com  Fact Sheet for Healthcare Providers: SeriousBroker.it  This test is not yet approved or cleared by the [place] FDA and has been authorized for detection and/or diagnosis of SARS-CoV-2 by FDA under an Emergency Use Authorization (EUA). This EUA will remain in effect (meaning this test can be used) for the duration of the COVID-19 declaration under Section 564(b)(1) of the Act, 21 U.S.C. section  360bbb-3(b)(1), unless the authorization is terminated or revoked.  Performed at [facility], [address] [place], [place] [address]   Urinalysis, Routine w reflex microscopic -Urine, Clean Catch     Status: Abnormal   Collection Time: 05/15/23 10:05 PM  Result Value Ref Range   Color, Urine YELLOW YELLOW   APPearance HAZY (A) CLEAR   Specific Gravity, Urine 1.015 1.005 - 1.030   pH 6.5 5.0 - 8.0   Glucose, UA NEGATIVE NEGATIVE mg/dL   Hgb urine dipstick NEGATIVE NEGATIVE   Bilirubin Urine NEGATIVE NEGATIVE   Ketones, ur NEGATIVE NEGATIVE mg/dL   Protein, ur TRACE (A) NEGATIVE mg/dL   Nitrite NEGATIVE NEGATIVE  Leukocytes,Ua MODERATE (A) NEGATIVE   RBC / HPF 0-5 0 - 5 RBC/hpf   WBC, UA 6-10 0 - 5 WBC/hpf   Bacteria, UA FEW (A) NONE SEEN   Squamous Epithelial / HPF 21-50 0 - 5 /HPF   Mucus PRESENT    Hyaline Casts, UA PRESENT     Comment: Performed at [facility], [address] [place], [place] [address]  Culture, blood (routine x 2)     Status: None   Collection Time: 05/16/23 12:30 AM   Specimen: BLOOD RIGHT ARM  Result Value Ref Range   Specimen Description      BLOOD RIGHT ARM Performed at [facility], [address], [place], [place] [address]    Special Requests      BOTTLES DRAWN AEROBIC AND ANAEROBIC Blood Culture results may not be optimal due to an inadequate volume of blood received in culture bottles Performed at [facility], [address], [place], [place] [address]    Culture      NO GROWTH 5 DAYS Performed at [facility], [address], [place], [place] [address]    Report Status 05/21/2023 FINAL          has a past medical history of Anemia, Anginal pain (HCC), Anxiety, Arthritis, Asthma, CAD (coronary artery disease), Chest pain, CHF (congestive heart failure) (HCC), Chronic bronchitis (HCC), Chronic lower back pain, COPD (chronic obstructive pulmonary  disease) (HCC), GERD (gastroesophageal reflux disease), History of blood transfusion (1960s), Hypercholesterolemia, Hypertension, Lower extremity edema, Mild aortic stenosis (03/07/2023), Mitral valve prolapse, NSTEMI (non-ST elevated myocardial infarction) (HCC) (05/01/2016), Pneumonia (05/01/2016), Seasonal allergies, Sjogren's syndrome (HCC), and Wheezing.   reports that she has never smoked. She has never used smokeless tobacco.  Past Surgical History:  Procedure Laterality Date   ABDOMINAL HYSTERECTOMY  1960s   "partial"   CARDIAC CATHETERIZATION N/A 05/02/2016   Procedure: Right/Left Heart Cath and Coronary Angiography;  Surgeon: Corky Crafts, MD;  Location: [facility] INVASIVE CV LAB;  Service: Cardiovascular;  Laterality: N/A;   CARDIAC CATHETERIZATION N/A 05/02/2016   Procedure: Coronary Stent Intervention;  Surgeon: Corky Crafts, MD;  Location: [facility] INVASIVE CV LAB;  Service: Cardiovascular;  Laterality: N/A;   CATARACT EXTRACTION W/PHACO Right 09/20/2019   Procedure: CATARACT EXTRACTION PHACO AND INTRAOCULAR LENS PLACEMENT (IOC) RIGHT;  Surgeon: Galen Manila, MD;  Location: [facility] ORS;  Service: Ophthalmology;  Laterality: Right;  [place] 01:29.4 CDE 19.28 FLUID PACK LOT # [id number]   CATARACT EXTRACTION W/PHACO Left 10/15/2019   Procedure: CATARACT EXTRACTION PHACO AND INTRAOCULAR LENS PLACEMENT (IOC) LEFT 8.76, 00:48.8;  Surgeon: Galen Manila, MD;  Location: [facility] SURGERY [facility];  Service: Ophthalmology;  Laterality: Left;   COLONOSCOPY W/ BIOPSIES AND POLYPECTOMY     CORONARY ANGIOPLASTY WITH STENT PLACEMENT  05/02/2016   "1 stent"   CORONARY PRESSURE/FFR STUDY N/A 02/17/2021   Procedure: INTRAVASCULAR PRESSURE WIRE/FFR STUDY;  Surgeon: Kathleene Hazel, MD;  Location: [facility] INVASIVE CV LAB;  Service: Cardiovascular;  Laterality: N/A;   CORONARY STENT INTERVENTION N/A 02/17/2021   Procedure: CORONARY STENT INTERVENTION;  Surgeon: Kathleene Hazel, MD;  Location: [facility]  INVASIVE CV LAB;  Service: Cardiovascular;  Laterality: N/A;   ESOPHAGOGASTRODUODENOSCOPY     JOINT REPLACEMENT Left 2007   hip   LAPAROSCOPIC SALPINGOOPHERECTOMY Bilateral ~ 1990   LEFT HEART CATH AND CORONARY ANGIOGRAPHY N/A 02/17/2021   Procedure: LEFT HEART CATH AND CORONARY ANGIOGRAPHY;  Surgeon: Kathleene Hazel, MD;  Location: [facility] INVASIVE CV LAB;  Service: Cardiovascular;  Laterality: N/A;   TOTAL HIP ARTHROPLASTY Left 2007    Allergies  Allergen Reactions   Levofloxacin Anaphylaxis   Plavix [Clopidogrel Bisulfate] Rash   Ciprofibrate     Other reaction(s): swelling, muscle aches   Codeine Nausea Only   Drug Ingredient [Ticagrelor] Other (See Comments)    Patient reports hemorrhage after medical trial of brilinta in 2017   Nitrofurantoin Other (See Comments)    unknown VISION CHANGES   Ciprofloxacin Rash    Other Reaction(s): nausea and vomiting   Penicillins Rash    Has patient had a PCN reaction causing immediate rash, facial/tongue/throat swelling, SOB or lightheadedness with hypotension: No Has patient had a PCN reaction causing severe rash involving mucus membranes or skin necrosis: No Has patient had a PCN reaction that required hospitalization: No Has patient had a PCN reaction occurring within the last 10 years: No If all of the above answers are "NO", then may proceed with Cephalosporin use.   Sulfa Antibiotics Rash    Immunization History  Administered Date(s) Administered   Influenza-Unspecified 07/02/2023   Pneumococcal-Unspecified 07/01/2014, 04/30/2022   Respiratory Syncytial Virus Vaccine,Recomb Aduvanted(Arexvy) 08/31/2022   Td 09/10/2019   Tdap 08/23/2015   Zoster Recombinant(Shingrix) 01/31/2018, 05/07/2018    Family History  Problem Relation Age of Onset   Hypertension Mother    Stroke Mother    Breast cancer Mother    Heart disease Father    Emphysema Father        smoked   Asthma Father    Ovarian cancer Sister      Current  Outpatient Medications:    albuterol (PROVENTIL) (2.5 MG/3ML) 0.083% nebulizer solution, Take 3 mLs (2.5 mg total) by nebulization every 2 (two) hours as needed for wheezing or shortness of breath., Disp: 75 mL, Rfl: 2   ALPRAZolam (XANAX) 0.5 MG tablet, Take 0.5 mg by mouth daily as needed for anxiety. , Disp: , Rfl:    aspirin EC 81 MG tablet, Take 81 mg by mouth daily., Disp: , Rfl:    budesonide-formoterol (SYMBICORT) 160-4.5 MCG/ACT inhaler, Inhale 2 puffs into the lungs 2 (two) times daily., Disp: , Rfl:    carboxymethylcellulose (REFRESH PLUS) 0.5 % SOLN, Place 1 drop into both eyes daily as needed (dry eyes)., Disp: , Rfl:    Cholecalciferol (VITAMIN D3) 2000 UNITS TABS, Take 2,000 Units by mouth daily., Disp: , Rfl:    clobetasol ointment (TEMOVATE) 0.05 %, Apply 1 Application topically as needed (for itching)., Disp: , Rfl:    cyanocobalamin 500 MCG tablet, Take 1,000 mcg by mouth daily., Disp: , Rfl:    diltiazem (CARDIZEM CD) 240 MG 24 hr capsule, Take 1 capsule (240 mg total) by mouth daily., Disp: 90 capsule, Rfl: 3   EPIPEN 2-PAK 0.3 MG/0.3ML SOAJ injection, Inject 0.3 mg into the skin daily as needed (allergic reaction). , Disp: , Rfl:    estradiol (ESTRACE) 0.5 MG tablet, Take 0.5 mg by mouth daily., Disp: , Rfl:    ezetimibe (ZETIA) 10 MG tablet, Take 10 mg by mouth daily., Disp: , Rfl:    FLUoxetine (PROZAC) 10 MG capsule, Take 10 mg by mouth daily., Disp: , Rfl:    fluticasone (FLONASE) 50 MCG/ACT nasal spray, Place 2 sprays into the nose daily., Disp: , Rfl:    furosemide (LASIX) 20 MG tablet, Take 1 tablet (20 mg total) by mouth daily., Disp: 90 tablet, Rfl: 3   gabapentin (NEURONTIN) 100 MG capsule, Take 100 mg by mouth at  bedtime. , Disp: , Rfl:    inclisiran (LEQVIO) 284 MG/1.5ML SOSY injection, Inject 284 mg into the skin once., Disp: , Rfl:    isosorbide mononitrate (IMDUR) 30 MG 24 hr tablet, TAKE 1 TABLET(30 MG) BY MOUTH DAILY, Disp: 90 tablet, Rfl: 3   metoprolol  tartrate (LOPRESSOR) 25 MG tablet, TAKE 1/2 TABLET(12.5 MG) BY MOUTH TWICE DAILY, Disp: 90 tablet, Rfl: 3   montelukast (SINGULAIR) 10 MG tablet, Take 10 mg by mouth at bedtime., Disp: , Rfl:    Multiple Vitamin (MULTIVITAMIN WITH MINERALS) TABS tablet, Take 1 tablet by mouth daily., Disp: , Rfl:    nitroGLYCERIN (NITROSTAT) 0.4 MG SL tablet, Place 1 tablet (0.4 mg total) under the tongue every 5 (five) minutes as needed for chest pain (up to 3 doses. If taking 3rd dose call 911)., Disp: 25 tablet, Rfl: 3   pantoprazole (PROTONIX) 40 MG tablet, TAKE 2 TABLETS(80 MG) BY MOUTH DAILY, Disp: 180 tablet, Rfl: 3   potassium chloride (KLOR-CON) 10 MEQ tablet, Take 10 mEq by mouth 2 (two) times a week. Take 1 tablet 2 times a week, Disp: , Rfl:    PROAIR HFA 108 (90 BASE) MCG/ACT inhaler, Inhale 1-2 puffs into the lungs every 4 (four) hours as needed for wheezing., Disp: , Rfl:    rosuvastatin (CRESTOR) 20 MG tablet, Take 1 tablet (20 mg total) by mouth daily at 6 PM., Disp: 30 tablet, Rfl: 0      Objective:   Vitals:   07/27/23 1124  BP: 124/74  Pulse: 69  SpO2: 94%  Weight: 219 lb (99.3 kg)  Height: 5\' 5"  (1.651 m)    Estimated body mass index is 36.44 kg/m as calculated from the following:   Height as of this encounter: 5\' 5"  (1.651 m).   Weight as of this encounter: 219 lb (99.3 kg).  @WEIGHTCHANGE @  [employer]   07/27/23 1124  Weight: 219 lb (99.3 kg)     Physical Exam   General: No distress. Obse O2 at rest: no Cane present: no Sitting in wheel chair: no Frail: no Obese: YEs Neuro: Alert and Oriented x 3. GCS 15. Speech normal Psych: Pleasant Resp:  Barrel Chest - no.  Wheeze - no, Crackles - no, No overt respiratory distress CVS: Normal heart sounds. Murmurs - no Ext: Stigmata of Connective Tissue Disease - no HEENT: Normal upper airway. PEERL +. No post nasal drip        Assessment:       ICD-10-CM   1. DOE (dyspnea on exertion)  R06.09 Sjogren's syndrome  antibods(ssa + ssb)    Sed Rate (ESR)    Anti-scleroderma antibody    B Nat Peptide    QuantiFERON-TB Gold Plus    Hypersensitivity Pneumonitis    Aldolase    CK Total (and CKMB)    Rheumatoid Factor    Antinuclear Antib (ANA)    Anti-DNA antibody, double-stranded    Pulmonary function test    Anti-DNA antibody, double-stranded    Antinuclear Antib (ANA)    Rheumatoid Factor    CK Total (and CKMB)    Aldolase    Hypersensitivity Pneumonitis    QuantiFERON-TB Gold Plus    B Nat Peptide    Anti-scleroderma antibody    Sed Rate (ESR)    Sjogren's syndrome antibods(ssa + ssb)    2. ILD (interstitial lung disease) (HCC)  J84.9 Sjogren's syndrome antibods(ssa + ssb)    Sed Rate (ESR)    Anti-scleroderma antibody  B Nat Peptide    QuantiFERON-TB Gold Plus    Hypersensitivity Pneumonitis    Aldolase    CK Total (and CKMB)    Rheumatoid Factor    Antinuclear Antib (ANA)    Anti-DNA antibody, double-stranded    Pulmonary function test    Anti-DNA antibody, double-stranded    Antinuclear Antib (ANA)    Rheumatoid Factor    CK Total (and CKMB)    Aldolase    Hypersensitivity Pneumonitis    QuantiFERON-TB Gold Plus    B Nat Peptide    Anti-scleroderma antibody    Sed Rate (ESR)    Sjogren's syndrome antibods(ssa + ssb)    3. History of diastolic dysfunction  Z86.79 Sjogren's syndrome antibods(ssa + ssb)    Sed Rate (ESR)    Anti-scleroderma antibody    B Nat Peptide    QuantiFERON-TB Gold Plus    Hypersensitivity Pneumonitis    Aldolase    CK Total (and CKMB)    Rheumatoid Factor    Antinuclear Antib (ANA)    Anti-DNA antibody, double-stranded    Pulmonary function test    Anti-DNA antibody, double-stranded    Antinuclear Antib (ANA)    Rheumatoid Factor    CK Total (and CKMB)    Aldolase    Hypersensitivity Pneumonitis    QuantiFERON-TB Gold Plus    B Nat Peptide    Anti-scleroderma antibody    Sed Rate (ESR)    Sjogren's syndrome antibods(ssa + ssb)          Plan:     Patient Instructions     ICD-10-CM   1. DOE (dyspnea on exertion)  R06.09     2. ILD (interstitial lung disease) (HCC)  J84.9        - I am concerned you  have Interstitial Lung Disease (ILD)  -  There are MANY varieties of this -   - we discussed post inflammatory fibrosis, NSIP.  IPF, autoimmune and chronic HP - To narrow down possibilities and assess severity please do the following  PLAN  - take ILD questionnaire packet with you and bring it back at  next visit - do full PFT  - do following blood work - autoimmune panel: Serum: ESR, ANA, DS-DNA, RF, anti-CCP, ssA, ssB, scl-70, Total CK,  Aldolase,   - do serum Hypersensitivity Pneumonitis Panel   - do blood  Quantiferon Gold   - do BNP   - possible biopsy discussion based on results of above  Followup  - < 6 weeks with Dr Marchelle Gearing but after completing above   FOLLOWUP No follow-ups on file.    SIGNATURE    Dr. Kalman Shan, M.D., F.C.C.P,  Pulmonary and Critical Care Medicine Staff Physician, [facility] Director - Interstitial Lung Disease  Program  Pulmonary Fibrosis [facility] at [facility] [place], [place], [address]  Pager: [phone], If no answer or between  15:00h - 7:00h: call [phone] Telephone: [phone]  1:51 PM 07/27/2023

## 2023-07-27 NOTE — Patient Instructions (Signed)
ICD-10-CM   1. DOE (dyspnea on exertion)  R06.09     2. ILD (interstitial lung disease) (HCC)  J84.9        - I am concerned you  have Interstitial Lung Disease (ILD)  -  There are MANY varieties of this -   - we discussed post inflammatory fibrosis, NSIP.  IPF, autoimmune and chronic HP - To narrow down possibilities and assess severity please do the following  PLAN  - take ILD questionnaire packet with you and bring it back at  next visit - do full PFT  - do following blood work - autoimmune panel: Serum: ESR, ANA, DS-DNA, RF, anti-CCP, ssA, ssB, scl-70, Total CK,  Aldolase,   - do serum Hypersensitivity Pneumonitis Panel   - do blood  Quantiferon Gold   - do BNP   - possible biopsy discussion based on results of above  Followup  - < 6 weeks with Dr Marchelle Gearing but after completing above

## 2023-07-31 ENCOUNTER — Institutional Professional Consult (permissible substitution): Payer: Medicare PPO | Admitting: Internal Medicine

## 2023-07-31 LAB — HYPERSENSITIVITY PNEUMONITIS
A. Pullulans Abs: NEGATIVE
A.Fumigatus #1 Abs: NEGATIVE
Micropolyspora faeni, IgG: NEGATIVE
Pigeon Serum Abs: NEGATIVE
Thermoact. Saccharii: NEGATIVE
Thermoactinomyces vulgaris, IgG: NEGATIVE

## 2023-07-31 LAB — CK TOTAL AND CKMB (NOT AT ARMC)
CK, MB: 1.1 ng/mL (ref 0–5.0)
Relative Index: 2.4 (ref 0–4.0)
Total CK: 46 U/L (ref 29–143)

## 2023-07-31 LAB — QUANTIFERON-TB GOLD PLUS
Mitogen-NIL: >10 [IU]/mL
NIL: 0.07 [IU]/mL
QuantiFERON-TB Gold Plus: NEGATIVE
TB1-NIL: 0.04 [IU]/mL
TB2-NIL: 0.01 [IU]/mL

## 2023-07-31 LAB — RHEUMATOID FACTOR: Rheumatoid fact SerPl-aCnc: 10 [IU]/mL (ref <14)

## 2023-07-31 LAB — ANA: Anti Nuclear Antibody (ANA): POSITIVE — AB

## 2023-07-31 LAB — SJOGREN'S SYNDROME ANTIBODS(SSA + SSB)
SSA (Ro) (ENA) Antibody, IgG: >8 AI — AB
SSB (La) (ENA) Antibody, IgG: <1 AI

## 2023-07-31 LAB — ANTI-NUCLEAR AB-TITER (ANA TITER): ANA Titer 1: 1:320 {titer} — ABNORMAL HIGH

## 2023-07-31 LAB — ALDOLASE: Aldolase: 3.9 U/L (ref <=8.1)

## 2023-07-31 LAB — ANTI-DNA ANTIBODY, DOUBLE-STRANDED: ds DNA Ab: <1 [IU]/mL

## 2023-07-31 LAB — ANTI-SCLERODERMA ANTIBODY: Scleroderma (Scl-70) (ENA) Antibody, IgG: <1 AI

## 2023-08-21 ENCOUNTER — Ambulatory Visit: Payer: Medicare PPO | Attending: Cardiovascular Disease | Admitting: Cardiovascular Disease

## 2023-08-21 ENCOUNTER — Encounter: Payer: Self-pay | Admitting: Cardiovascular Disease

## 2023-08-21 VITALS — BP 144/62 | HR 63 | Ht 65.0 in | Wt 217.6 lb

## 2023-08-21 DIAGNOSIS — E785 Hyperlipidemia, unspecified: Secondary | ICD-10-CM | POA: Diagnosis not present

## 2023-08-21 DIAGNOSIS — I5032 Chronic diastolic (congestive) heart failure: Secondary | ICD-10-CM

## 2023-08-21 DIAGNOSIS — I25119 Atherosclerotic heart disease of native coronary artery with unspecified angina pectoris: Secondary | ICD-10-CM

## 2023-08-21 DIAGNOSIS — I350 Nonrheumatic aortic (valve) stenosis: Secondary | ICD-10-CM | POA: Diagnosis not present

## 2023-08-21 DIAGNOSIS — I10 Essential (primary) hypertension: Secondary | ICD-10-CM | POA: Diagnosis not present

## 2023-08-21 NOTE — Patient Instructions (Signed)
Medication Instructions:  Your physician recommends that you continue on your current medications as directed. Please refer to the Current Medication list given to you today.  *If you need a refill on your cardiac medications before your next appointment, please call your pharmacy*  Follow-Up: At [facility], you and your health needs are our priority.  As part of our continuing mission to provide you with exceptional heart care, we have created designated Provider Care Teams.  These Care Teams include your primary Cardiologist (physician) and Advanced Practice Providers (APPs -  Physician Assistants and Nurse Practitioners) who all work together to provide you with the care you need, when you need it.  We recommend signing up for the patient portal called "MyChart".  Sign up information is provided on this After Visit Summary.  MyChart is used to connect with patients for Virtual Visits (Telemedicine).  Patients are able to view lab/test results, encounter notes, upcoming appointments, etc.  Non-urgent messages can be sent to your provider as well.   To learn more about what you can do with MyChart, go to [email or web address].    Your next appointment:   6 month(s)  Provider:   Tereso Newcomer, PA-C     Then, Tonny Bollman, MD will plan to see you again in 1 year(s).

## 2023-08-21 NOTE — Assessment & Plan Note (Signed)
LDL cholesterol is 31.  She is treated with rosuvastatin, Zetia, and inclisiran.

## 2023-08-21 NOTE — Assessment & Plan Note (Signed)
Blood pressure controlled on multidrug therapy.  No changes made today.

## 2023-08-21 NOTE — Progress Notes (Signed)
guide catheter was inserted. Lesion crossed with guidewire using a WIRE COUGAR XT STRL 190CM. Pre-stent angioplasty was performed using a BALLOON SAPPHIRE 2.0X12. A drug-eluting stent was successfully placed using a SYNERGY XD 2.50X16. Stent strut is well apposed. Post-stent angioplasty was performed using a BALLOON SAPPHIRE [place] [id number]. Post-Intervention Lesion Assessment The intervention was successful. Pre-interventional TIMI flow is 3. Post-intervention TIMI flow is 3. No complications occurred at this lesion. There is a 0% residual stenosis post intervention.  2nd Diag lesion Angioplasty Post-Intervention Lesion Assessment The intervention was successful. Pre-interventional TIMI flow is 3. Post-intervention TIMI flow is 3. No complications occurred at this lesion. There is a 80% residual stenosis post intervention.   CARDIAC CATHETERIZATION  CARDIAC CATHETERIZATION 05/02/2016  Narrative  Prox LAD to Mid LAD lesion, 30% stenosed.  Mid LAD lesion, 50% stenosed  at bifurcation of Ost 2nd Diag to 2nd Diag lesion, 70% stenosed.  1st Mrg-1 lesion, 95% stenosed. Post intervention with a 2.5 x 16 Synergy drug eluting stent post dilated to 2.8 mm, there is a 0% residual stenosis.  Moderate pulmonary HTN.  Elevated LVEDP. CO 8.7 L/min. CI 3.96.  Continue dual antiplatelet therapy ideally for a year. She does have anemia but has had bleeding source is ruled out. Brilinta started. She would be a candidate for TWILIGHT study.  She will need some diuresis over the next few days.  Findings Coronary Findings Diagnostic  Dominance: Co-dominant  Left Anterior Descending Calcified. located at the major branch.  Second Diagonal Branch  Left Circumflex  First Obtuse Marginal Branch Thrombotic.  Right Coronary Artery The vessel exhibits minimal luminal irregularities.  Intervention  1st Mrg-1 lesion PCI The pre-interventional distal flow is normal (TIMI 3). Pre-stent angioplasty was performed. A drug-eluting stent was placed. The strut is apposed. Post-stent angioplasty was performed. The post-interventional distal flow is normal (TIMI 3). The intervention was successful. No complications occurred at this lesion. Supplies used: BALLN EMER[place] MR 2.5X15; STENT SYNERGY DES 2.5X16; BALLN [place] EMERGE MR [id number] There is a 0% residual stenosis post intervention.   STRESS TESTS  MYOCARDIAL PERFUSION IMAGING 02/08/2023  Narrative   The study is normal. The study is low risk.   No ST deviation was noted.   LV perfusion is normal.   Left ventricular function is abnormal. Global function is mildly reduced. Nuclear stress EF: 51 %. The left ventricular ejection fraction is mildly decreased (45-54%). End diastolic cavity size is normal.   Prior study available for comparison from 01/20/2020. No changes compared to prior study.  Low risk stress nuclear study with normal perfusion. Borderline reduced left ventricular global systolic function, similar to the previous  study.   ECHOCARDIOGRAM  ECHOCARDIOGRAM COMPLETE 02/28/2023  Narrative ECHOCARDIOGRAM REPORT    Patient Name:   Donna Williamson Date of Exam: 02/28/2023 Medical Rec #:  [id number]         Height:       64.0 in Accession #:    [id number]        Weight:       225.0 lb Date of Birth:  11-Jul-1946         BSA:          2.057 m Patient Age:    77 years          BP:           104/50 mmHg Patient Gender: F                 HR:  guide catheter was inserted. Lesion crossed with guidewire using a WIRE COUGAR XT STRL 190CM. Pre-stent angioplasty was performed using a BALLOON SAPPHIRE 2.0X12. A drug-eluting stent was successfully placed using a SYNERGY XD 2.50X16. Stent strut is well apposed. Post-stent angioplasty was performed using a BALLOON SAPPHIRE [place] [id number]. Post-Intervention Lesion Assessment The intervention was successful. Pre-interventional TIMI flow is 3. Post-intervention TIMI flow is 3. No complications occurred at this lesion. There is a 0% residual stenosis post intervention.  2nd Diag lesion Angioplasty Post-Intervention Lesion Assessment The intervention was successful. Pre-interventional TIMI flow is 3. Post-intervention TIMI flow is 3. No complications occurred at this lesion. There is a 80% residual stenosis post intervention.   CARDIAC CATHETERIZATION  CARDIAC CATHETERIZATION 05/02/2016  Narrative  Prox LAD to Mid LAD lesion, 30% stenosed.  Mid LAD lesion, 50% stenosed  at bifurcation of Ost 2nd Diag to 2nd Diag lesion, 70% stenosed.  1st Mrg-1 lesion, 95% stenosed. Post intervention with a 2.5 x 16 Synergy drug eluting stent post dilated to 2.8 mm, there is a 0% residual stenosis.  Moderate pulmonary HTN.  Elevated LVEDP. CO 8.7 L/min. CI 3.96.  Continue dual antiplatelet therapy ideally for a year. She does have anemia but has had bleeding source is ruled out. Brilinta started. She would be a candidate for TWILIGHT study.  She will need some diuresis over the next few days.  Findings Coronary Findings Diagnostic  Dominance: Co-dominant  Left Anterior Descending Calcified. located at the major branch.  Second Diagonal Branch  Left Circumflex  First Obtuse Marginal Branch Thrombotic.  Right Coronary Artery The vessel exhibits minimal luminal irregularities.  Intervention  1st Mrg-1 lesion PCI The pre-interventional distal flow is normal (TIMI 3). Pre-stent angioplasty was performed. A drug-eluting stent was placed. The strut is apposed. Post-stent angioplasty was performed. The post-interventional distal flow is normal (TIMI 3). The intervention was successful. No complications occurred at this lesion. Supplies used: BALLN EMER[place] MR 2.5X15; STENT SYNERGY DES 2.5X16; BALLN [place] EMERGE MR [id number] There is a 0% residual stenosis post intervention.   STRESS TESTS  MYOCARDIAL PERFUSION IMAGING 02/08/2023  Narrative   The study is normal. The study is low risk.   No ST deviation was noted.   LV perfusion is normal.   Left ventricular function is abnormal. Global function is mildly reduced. Nuclear stress EF: 51 %. The left ventricular ejection fraction is mildly decreased (45-54%). End diastolic cavity size is normal.   Prior study available for comparison from 01/20/2020. No changes compared to prior study.  Low risk stress nuclear study with normal perfusion. Borderline reduced left ventricular global systolic function, similar to the previous  study.   ECHOCARDIOGRAM  ECHOCARDIOGRAM COMPLETE 02/28/2023  Narrative ECHOCARDIOGRAM REPORT    Patient Name:   Donna Williamson Date of Exam: 02/28/2023 Medical Rec #:  [id number]         Height:       64.0 in Accession #:    [id number]        Weight:       225.0 lb Date of Birth:  11-Jul-1946         BSA:          2.057 m Patient Age:    77 years          BP:           104/50 mmHg Patient Gender: F                 HR:  guide catheter was inserted. Lesion crossed with guidewire using a WIRE COUGAR XT STRL 190CM. Pre-stent angioplasty was performed using a BALLOON SAPPHIRE 2.0X12. A drug-eluting stent was successfully placed using a SYNERGY XD 2.50X16. Stent strut is well apposed. Post-stent angioplasty was performed using a BALLOON SAPPHIRE [place] [id number]. Post-Intervention Lesion Assessment The intervention was successful. Pre-interventional TIMI flow is 3. Post-intervention TIMI flow is 3. No complications occurred at this lesion. There is a 0% residual stenosis post intervention.  2nd Diag lesion Angioplasty Post-Intervention Lesion Assessment The intervention was successful. Pre-interventional TIMI flow is 3. Post-intervention TIMI flow is 3. No complications occurred at this lesion. There is a 80% residual stenosis post intervention.   CARDIAC CATHETERIZATION  CARDIAC CATHETERIZATION 05/02/2016  Narrative  Prox LAD to Mid LAD lesion, 30% stenosed.  Mid LAD lesion, 50% stenosed  at bifurcation of Ost 2nd Diag to 2nd Diag lesion, 70% stenosed.  1st Mrg-1 lesion, 95% stenosed. Post intervention with a 2.5 x 16 Synergy drug eluting stent post dilated to 2.8 mm, there is a 0% residual stenosis.  Moderate pulmonary HTN.  Elevated LVEDP. CO 8.7 L/min. CI 3.96.  Continue dual antiplatelet therapy ideally for a year. She does have anemia but has had bleeding source is ruled out. Brilinta started. She would be a candidate for TWILIGHT study.  She will need some diuresis over the next few days.  Findings Coronary Findings Diagnostic  Dominance: Co-dominant  Left Anterior Descending Calcified. located at the major branch.  Second Diagonal Branch  Left Circumflex  First Obtuse Marginal Branch Thrombotic.  Right Coronary Artery The vessel exhibits minimal luminal irregularities.  Intervention  1st Mrg-1 lesion PCI The pre-interventional distal flow is normal (TIMI 3). Pre-stent angioplasty was performed. A drug-eluting stent was placed. The strut is apposed. Post-stent angioplasty was performed. The post-interventional distal flow is normal (TIMI 3). The intervention was successful. No complications occurred at this lesion. Supplies used: BALLN EMER[place] MR 2.5X15; STENT SYNERGY DES 2.5X16; BALLN [place] EMERGE MR [id number] There is a 0% residual stenosis post intervention.   STRESS TESTS  MYOCARDIAL PERFUSION IMAGING 02/08/2023  Narrative   The study is normal. The study is low risk.   No ST deviation was noted.   LV perfusion is normal.   Left ventricular function is abnormal. Global function is mildly reduced. Nuclear stress EF: 51 %. The left ventricular ejection fraction is mildly decreased (45-54%). End diastolic cavity size is normal.   Prior study available for comparison from 01/20/2020. No changes compared to prior study.  Low risk stress nuclear study with normal perfusion. Borderline reduced left ventricular global systolic function, similar to the previous  study.   ECHOCARDIOGRAM  ECHOCARDIOGRAM COMPLETE 02/28/2023  Narrative ECHOCARDIOGRAM REPORT    Patient Name:   Donna Williamson Date of Exam: 02/28/2023 Medical Rec #:  [id number]         Height:       64.0 in Accession #:    [id number]        Weight:       225.0 lb Date of Birth:  11-Jul-1946         BSA:          2.057 m Patient Age:    77 years          BP:           104/50 mmHg Patient Gender: F                 HR:  Cardiology Office Note:    Date:  08/21/2023   ID:  Aishwarya, Peplinski 07-17-1946, MRN [id number]  PCP:  Rodrigo Ran, MD   [facility] Providers Cardiologist:  Tonny Bollman, MD     Referring MD: Rodrigo Ran, MD   Chief Complaint  Patient presents with   Coronary Artery Disease    History of Present Illness:    SAMAN YAZEL is a 77 y.o. female with a hx of:  Coronary artery disease  Intol to Brilinta (epistaxis); Allergy to Plavix  NSTEMI 04/2016 s/p DES to OM1 Myoview 3/21: low risk [place] 01/2021 s/p 2.5 x 16 mm DES to LAD and POBA of D2  Residual CAD on cath 02/17/21: pLAD 40, pLCx 40, OM1 stent patent Effient Rx due to allergy to Brilinta and Plavix  Myoview 02/08/2023: Normal perfusion, EF 51 (HFpEF) heart failure with preserved ejection fraction  TTE 02/18/2021: EF 60-65  TTE 02/28/2023: EF 55-60, no RWMA, mild asymmetric LVH, NL RVSF, mild MR, mild aortic stenosis (bulky subvalvular calcification), mean AV gradient 9.6, V-max 199 cm/s, RAP 3 Mild aortic stenosis  Parox Supraventricular Tachycardia  Monitor 06/2019: NSR avg HR 67, short SVT (8-10 beats), PVCs < 1% burden  Hypertension  Hyperlipidemia  Chronic kidney disease stage III Chronic Obstructive Pulmonary Disease Pulmonary fibrosis  GERD Sjogren's syndrome  Sleep apnea  Carotid US 12/2013: Normal bilateral carotid arteries    The patient is here with her husband today.  She is undergoing evaluation for interstitial lung disease.  She has seen Dr. Marchelle Gearing and has a follow-up in the next few weeks.  Her labs are reviewed and there are multiple positive serologies.  From a cardiac standpoint, she feels like she is doing fine.  She has not had any edema.  There is no change in her shortness of breath with activity.  She denies orthopnea, PND, or chest pain.  No heart palpitations.  Current Medications: Current Meds  Medication Sig   albuterol (PROVENTIL) (2.5 MG/3ML) 0.083% nebulizer solution  Take 3 mLs (2.5 mg total) by nebulization every 2 (two) hours as needed for wheezing or shortness of breath.   ALPRAZolam (XANAX) 0.5 MG tablet Take 0.5 mg by mouth daily as needed for anxiety.    aspirin EC 81 MG tablet Take 81 mg by mouth daily.   budesonide-formoterol (SYMBICORT) 160-4.5 MCG/ACT inhaler Inhale 2 puffs into the lungs 2 (two) times daily.   carboxymethylcellulose (REFRESH PLUS) 0.5 % SOLN Place 1 drop into both eyes daily as needed (dry eyes).   Cholecalciferol (VITAMIN D3) 2000 UNITS TABS Take 2,000 Units by mouth daily.   clobetasol ointment (TEMOVATE) 0.05 % Apply 1 Application topically as needed (for itching).   cyanocobalamin 500 MCG tablet Take 1,000 mcg by mouth daily.   diltiazem (CARDIZEM CD) 240 MG 24 hr capsule Take 1 capsule (240 mg total) by mouth daily.   EPIPEN 2-PAK 0.3 MG/0.3ML SOAJ injection Inject 0.3 mg into the skin daily as needed (allergic reaction).    estradiol (ESTRACE) 0.5 MG tablet Take 0.5 mg by mouth daily.   ezetimibe (ZETIA) 10 MG tablet Take 10 mg by mouth daily.   FLUoxetine (PROZAC) 10 MG capsule Take 10 mg by mouth daily.   fluticasone (FLONASE) 50 MCG/ACT nasal spray Place 2 sprays into the nose daily.   furosemide (LASIX) 20 MG tablet Take 1 tablet (20 mg total) by mouth daily.   gabapentin (NEURONTIN) 100 MG capsule Take 100 mg by mouth at bedtime.  guide catheter was inserted. Lesion crossed with guidewire using a WIRE COUGAR XT STRL 190CM. Pre-stent angioplasty was performed using a BALLOON SAPPHIRE 2.0X12. A drug-eluting stent was successfully placed using a SYNERGY XD 2.50X16. Stent strut is well apposed. Post-stent angioplasty was performed using a BALLOON SAPPHIRE [place] [id number]. Post-Intervention Lesion Assessment The intervention was successful. Pre-interventional TIMI flow is 3. Post-intervention TIMI flow is 3. No complications occurred at this lesion. There is a 0% residual stenosis post intervention.  2nd Diag lesion Angioplasty Post-Intervention Lesion Assessment The intervention was successful. Pre-interventional TIMI flow is 3. Post-intervention TIMI flow is 3. No complications occurred at this lesion. There is a 80% residual stenosis post intervention.   CARDIAC CATHETERIZATION  CARDIAC CATHETERIZATION 05/02/2016  Narrative  Prox LAD to Mid LAD lesion, 30% stenosed.  Mid LAD lesion, 50% stenosed  at bifurcation of Ost 2nd Diag to 2nd Diag lesion, 70% stenosed.  1st Mrg-1 lesion, 95% stenosed. Post intervention with a 2.5 x 16 Synergy drug eluting stent post dilated to 2.8 mm, there is a 0% residual stenosis.  Moderate pulmonary HTN.  Elevated LVEDP. CO 8.7 L/min. CI 3.96.  Continue dual antiplatelet therapy ideally for a year. She does have anemia but has had bleeding source is ruled out. Brilinta started. She would be a candidate for TWILIGHT study.  She will need some diuresis over the next few days.  Findings Coronary Findings Diagnostic  Dominance: Co-dominant  Left Anterior Descending Calcified. located at the major branch.  Second Diagonal Branch  Left Circumflex  First Obtuse Marginal Branch Thrombotic.  Right Coronary Artery The vessel exhibits minimal luminal irregularities.  Intervention  1st Mrg-1 lesion PCI The pre-interventional distal flow is normal (TIMI 3). Pre-stent angioplasty was performed. A drug-eluting stent was placed. The strut is apposed. Post-stent angioplasty was performed. The post-interventional distal flow is normal (TIMI 3). The intervention was successful. No complications occurred at this lesion. Supplies used: BALLN [place]ER[place] MR 2.5X15; STENT SYNERGY DES 2.5X16; BALLN [place] EMERGE MR [id number] There is a 0% residual stenosis post intervention.   STRESS TESTS  MYOCARDIAL PERFUSION IMAGING 02/08/2023  Narrative   The study is normal. The study is low risk.   No ST deviation was noted.   LV perfusion is normal.   Left ventricular function is abnormal. Global function is mildly reduced. Nuclear stress EF: 51 %. The left ventricular ejection fraction is mildly decreased (45-54%). End diastolic cavity size is normal.   Prior study available for comparison from 01/20/2020. No changes compared to prior study.  Low risk stress nuclear study with normal perfusion. Borderline reduced left ventricular global systolic function, similar to the previous  study.   ECHOCARDIOGRAM  ECHOCARDIOGRAM COMPLETE 02/28/2023  Narrative ECHOCARDIOGRAM REPORT    Patient Name:   Donna Williamson Date of Exam: 02/28/2023 Medical Rec #:  [id number]         Height:       64.0 in Accession #:    [id number]        Weight:       225.0 lb Date of Birth:  11-Jul-1946         BSA:          2.057 m Patient Age:    77 years          BP:           104/50 mmHg Patient Gender: F                 HR:  Cardiology Office Note:    Date:  08/21/2023   ID:  Aishwarya, Peplinski 07-17-1946, MRN [id number]  PCP:  Rodrigo Ran, MD   [facility] Providers Cardiologist:  Tonny Bollman, MD     Referring MD: Rodrigo Ran, MD   Chief Complaint  Patient presents with   Coronary Artery Disease    History of Present Illness:    SAMAN YAZEL is a 77 y.o. female with a hx of:  Coronary artery disease  Intol to Brilinta (epistaxis); Allergy to Plavix  NSTEMI 04/2016 s/p DES to OM1 Myoview 3/21: low risk [place] 01/2021 s/p 2.5 x 16 mm DES to LAD and POBA of D2  Residual CAD on cath 02/17/21: pLAD 40, pLCx 40, OM1 stent patent Effient Rx due to allergy to Brilinta and Plavix  Myoview 02/08/2023: Normal perfusion, EF 51 (HFpEF) heart failure with preserved ejection fraction  TTE 02/18/2021: EF 60-65  TTE 02/28/2023: EF 55-60, no RWMA, mild asymmetric LVH, NL RVSF, mild MR, mild aortic stenosis (bulky subvalvular calcification), mean AV gradient 9.6, V-max 199 cm/s, RAP 3 Mild aortic stenosis  Parox Supraventricular Tachycardia  Monitor 06/2019: NSR avg HR 67, short SVT (8-10 beats), PVCs < 1% burden  Hypertension  Hyperlipidemia  Chronic kidney disease stage III Chronic Obstructive Pulmonary Disease Pulmonary fibrosis  GERD Sjogren's syndrome  Sleep apnea  Carotid US 12/2013: Normal bilateral carotid arteries    The patient is here with her husband today.  She is undergoing evaluation for interstitial lung disease.  She has seen Dr. Marchelle Gearing and has a follow-up in the next few weeks.  Her labs are reviewed and there are multiple positive serologies.  From a cardiac standpoint, she feels like she is doing fine.  She has not had any edema.  There is no change in her shortness of breath with activity.  She denies orthopnea, PND, or chest pain.  No heart palpitations.  Current Medications: Current Meds  Medication Sig   albuterol (PROVENTIL) (2.5 MG/3ML) 0.083% nebulizer solution  Take 3 mLs (2.5 mg total) by nebulization every 2 (two) hours as needed for wheezing or shortness of breath.   ALPRAZolam (XANAX) 0.5 MG tablet Take 0.5 mg by mouth daily as needed for anxiety.    aspirin EC 81 MG tablet Take 81 mg by mouth daily.   budesonide-formoterol (SYMBICORT) 160-4.5 MCG/ACT inhaler Inhale 2 puffs into the lungs 2 (two) times daily.   carboxymethylcellulose (REFRESH PLUS) 0.5 % SOLN Place 1 drop into both eyes daily as needed (dry eyes).   Cholecalciferol (VITAMIN D3) 2000 UNITS TABS Take 2,000 Units by mouth daily.   clobetasol ointment (TEMOVATE) 0.05 % Apply 1 Application topically as needed (for itching).   cyanocobalamin 500 MCG tablet Take 1,000 mcg by mouth daily.   diltiazem (CARDIZEM CD) 240 MG 24 hr capsule Take 1 capsule (240 mg total) by mouth daily.   EPIPEN 2-PAK 0.3 MG/0.3ML SOAJ injection Inject 0.3 mg into the skin daily as needed (allergic reaction).    estradiol (ESTRACE) 0.5 MG tablet Take 0.5 mg by mouth daily.   ezetimibe (ZETIA) 10 MG tablet Take 10 mg by mouth daily.   FLUoxetine (PROZAC) 10 MG capsule Take 10 mg by mouth daily.   fluticasone (FLONASE) 50 MCG/ACT nasal spray Place 2 sprays into the nose daily.   furosemide (LASIX) 20 MG tablet Take 1 tablet (20 mg total) by mouth daily.   gabapentin (NEURONTIN) 100 MG capsule Take 100 mg by mouth at bedtime.

## 2023-08-21 NOTE — Assessment & Plan Note (Signed)
The patient reports no new anginal symptoms.  She continues on aspirin for antiplatelet therapy.  Her antianginal program includes diltiazem and isosorbide as well as metoprolol.  Continue current management.

## 2023-08-21 NOTE — Assessment & Plan Note (Signed)
Reviewed most recent echo.  Will repeat her echo next year prior to her office visit.  Exam is consistent with mild aortic stenosis.  Her mean transvalvular gradient is 10 mmHg.

## 2023-08-21 NOTE — Assessment & Plan Note (Signed)
Stable on low-dose furosemide.  Reports no changes in her weight and no edema.  Continue current management.  Discussed out diastolic heart failure can impact her symptoms in the context of interstitial lung disease.

## 2023-08-25 DIAGNOSIS — L98491 Non-pressure chronic ulcer of skin of other sites limited to breakdown of skin: Secondary | ICD-10-CM | POA: Diagnosis not present

## 2023-08-25 DIAGNOSIS — B372 Candidiasis of skin and nail: Secondary | ICD-10-CM | POA: Diagnosis not present

## 2023-08-28 DIAGNOSIS — H43813 Vitreous degeneration, bilateral: Secondary | ICD-10-CM | POA: Diagnosis not present

## 2023-08-28 DIAGNOSIS — Z0100 Encounter for examination of eyes and vision without abnormal findings: Secondary | ICD-10-CM | POA: Diagnosis not present

## 2023-08-28 DIAGNOSIS — H169 Unspecified keratitis: Secondary | ICD-10-CM | POA: Diagnosis not present

## 2023-09-06 DIAGNOSIS — I129 Hypertensive chronic kidney disease with stage 1 through stage 4 chronic kidney disease, or unspecified chronic kidney disease: Secondary | ICD-10-CM | POA: Diagnosis not present

## 2023-09-06 DIAGNOSIS — I5032 Chronic diastolic (congestive) heart failure: Secondary | ICD-10-CM | POA: Diagnosis not present

## 2023-09-06 DIAGNOSIS — R0600 Dyspnea, unspecified: Secondary | ICD-10-CM | POA: Diagnosis not present

## 2023-09-06 DIAGNOSIS — J849 Interstitial pulmonary disease, unspecified: Secondary | ICD-10-CM | POA: Diagnosis not present

## 2023-09-06 DIAGNOSIS — I2510 Atherosclerotic heart disease of native coronary artery without angina pectoris: Secondary | ICD-10-CM | POA: Diagnosis not present

## 2023-09-06 DIAGNOSIS — M3500 Sicca syndrome, unspecified: Secondary | ICD-10-CM | POA: Diagnosis not present

## 2023-09-15 ENCOUNTER — Encounter: Payer: Self-pay | Admitting: Podiatry

## 2023-09-15 ENCOUNTER — Ambulatory Visit: Payer: Medicare PPO | Admitting: Podiatry

## 2023-09-15 DIAGNOSIS — M76821 Posterior tibial tendinitis, right leg: Secondary | ICD-10-CM | POA: Diagnosis not present

## 2023-09-15 DIAGNOSIS — M778 Other enthesopathies, not elsewhere classified: Secondary | ICD-10-CM

## 2023-09-15 MED ORDER — TRIAMCINOLONE ACETONIDE 10 MG/ML IJ SUSP
5.0000 mg | Freq: Once | INTRAMUSCULAR | Status: AC
Start: 2023-09-15 — End: 2023-09-15
  Administered 2023-09-15: 5 mg via INTRAMUSCULAR

## 2023-09-17 NOTE — Progress Notes (Signed)
Chief Complaint  Patient presents with   Foot Pain    Has right foot pain in "in step" area.  Pain has worsened, goes up leg.  Has decreased feeling in left foot and definitely in right foot.  Has swelling on bottom foot.  Has seen Dr. Al Corpus in the past.  Nail trim on both feet.  Has hammer toes on right foot.    HPI: 77 y.o. female presents today with pain to her right instep.  She states that she first noticed this about 1 week ago.  She believes this could have been caused by altered gait as she states that she pulled her right hamstring about 1 month ago which is caused her to walk with a limp.  She identifies the pain correlating with the area of the PT tendon.  She has previously seen Dr. Al Corpus for PT tendinitis in the past.  She states the pain is in a similar location.  She denies any nausea, vomiting, fever, chills, chest pain, shortness of breath.  Past Medical History:  Diagnosis Date   Anemia    Anginal pain (HCC)    Anxiety    Arthritis    "feet, knees, back" (05/02/2016)   Asthma    CAD (coronary artery disease)    a. 04/2016: NSTEMI 95% stenosis 1st Mrg (s/p DES)   Chest pain    a. 10/2013 Exercise Myoview: Ef 65%, no ischemia.   CHF (congestive heart failure) (HCC)    Chronic bronchitis (HCC)    Chronic lower back pain    COPD (chronic obstructive pulmonary disease) (HCC)    GERD (gastroesophageal reflux disease)    History of blood transfusion 1960s   "related to my hysterectomy"   Hypercholesterolemia    Hypertension    Lower extremity edema    Mild aortic stenosis 03/07/2023   TTE 02/28/2023: EF 55-60, no RWMA, mild asymmetric LVH, NL RVSF, mild MR, mild aortic stenosis (bulky subvalvular calcification), mean AV gradient 9.6, V-max 199 cm/s, RAP 3   Mitral valve prolapse    NSTEMI (non-ST elevated myocardial infarction) (HCC) 05/01/2016   Pneumonia 05/01/2016   "saw trace of slight pneumonia/CT scan" (05/02/2016)   Seasonal allergies    Sjogren's syndrome  (HCC)    Wheezing     Past Surgical History:  Procedure Laterality Date   ABDOMINAL HYSTERECTOMY  1960s   "partial"   CARDIAC CATHETERIZATION N/A 05/02/2016   Procedure: Right/Left Heart Cath and Coronary Angiography;  Surgeon: Corky Crafts, MD;  Location: [facility];  Service: Cardiovascular;  Laterality: N/A;   CARDIAC CATHETERIZATION N/A 05/02/2016   Procedure: Coronary Stent Intervention;  Surgeon: Corky Crafts, MD;  Location: [facility] INVASIVE CV LAB;  Service: Cardiovascular;  Laterality: N/A;   CATARACT EXTRACTION W/PHACO Right 09/20/2019   Procedure: CATARACT EXTRACTION PHACO AND INTRAOCULAR LENS PLACEMENT (IOC) RIGHT;  Surgeon: Galen Manila, MD;  Location: [facility] ORS;  Service: Ophthalmology;  Laterality: Right;  [place] 01:29.4 CDE 19.28 FLUID PACK LOT # [id number]   CATARACT EXTRACTION W/PHACO Left 10/15/2019   Procedure: CATARACT EXTRACTION PHACO AND INTRAOCULAR LENS PLACEMENT (IOC) LEFT 8.76, 00:48.8;  Surgeon: Galen Manila, MD;  Location: [facility];  Service: Ophthalmology;  Laterality: Left;   COLONOSCOPY W/ BIOPSIES AND POLYPECTOMY     CORONARY ANGIOPLASTY WITH STENT PLACEMENT  05/02/2016   "1 stent"   CORONARY PRESSURE/FFR STUDY N/A 02/17/2021   Procedure: INTRAVASCULAR PRESSURE WIRE/FFR STUDY;  Surgeon: Kathleene Hazel, MD;  Location:  [facility];  Service: Cardiovascular;  Laterality: N/A;   CORONARY STENT INTERVENTION N/A 02/17/2021   Procedure: CORONARY STENT INTERVENTION;  Surgeon: Kathleene Hazel, MD;  Location: [facility] CV [facility];  Service: Cardiovascular;  Laterality: N/A;   ESOPHAGOGASTRODUODENOSCOPY     JOINT REPLACEMENT Left 2007   hip   LAPAROSCOPIC SALPINGOOPHERECTOMY Bilateral ~ 1990   LEFT HEART CATH AND CORONARY ANGIOGRAPHY N/A 02/17/2021   Procedure: LEFT HEART CATH AND CORONARY ANGIOGRAPHY;  Surgeon: Kathleene Hazel, MD;  Location: [facility];  Service: Cardiovascular;  Laterality: N/A;   TOTAL  HIP ARTHROPLASTY Left 2007    Allergies  Allergen Reactions   Levofloxacin Anaphylaxis   Plavix [Clopidogrel Bisulfate] Rash   Ciprofibrate     Other reaction(s): swelling, muscle aches   Codeine Nausea Only   Drug Ingredient [Ticagrelor] Other (See Comments)    Patient reports hemorrhage after medical trial of brilinta in 2017   Nitrofurantoin Other (See Comments)    unknown VISION CHANGES   Ciprofloxacin Rash    Other Reaction(s): nausea and vomiting   Penicillins Rash    Has patient had a PCN reaction causing immediate rash, facial/tongue/throat swelling, SOB or lightheadedness with hypotension: No Has patient had a PCN reaction causing severe rash involving mucus membranes or skin necrosis: No Has patient had a PCN reaction that required hospitalization: No Has patient had a PCN reaction occurring within the last 10 years: No If all of the above answers are "NO", then may proceed with Cephalosporin use.   Sulfa Antibiotics Rash    ROS -negative except as stated in HPI.   Physical Exam: There were no vitals filed for this visit.  General: The patient is alert and oriented x3 in no acute distress.  Dermatology: Skin is warm, dry and supple bilateral lower extremities. Interspaces are clear of maceration and debris.    Vascular: Palpable pedal pulses bilaterally. Capillary refill within normal limits.  No appreciable edema.  No erythema or calor.  Telangiectasias appreciated.  Neurological: Light touch sensation grossly intact bilateral feet.   Musculoskeletal Exam: Tenderness on palpation of the right PT tendon at the level of its insertion to the navicular tuberosity and along its sheath proximal to this. Pain with resisted inversion.  No pain to ankle joint or subtalar joint with range of motion on palpation.    Assessment/Plan of Care: 1. Posterior tibial tendon dysfunction (PTTD) of right lower extremity   2. Capsulitis of right foot      Meds ordered this  encounter  Medications   triamcinolone acetonide (KENALOG) 10 MG/ML injection 5 mg   None  Discussed clinical findings with patient today.  Consent obtained to perform corticosteroid injection of the PT tendon after discussing risk and benefits.  Tendon sheath was injected with 0.5 cc of 2% lidocaine plain and 0.5 cc of Kenalog 10 after alcohol skin prep.  Band-Aid was applied.  Patient tolerated this well.  Tri-Lock ankle brace was dispensed to the patient, instructed to use this to the right foot weightbearing at all times.  Discussed importance of good supportive shoe gear.  Discussed with patient that PT tendinitis is an overuse injury in some cases immobilization or physical therapy may be required.  Follow-up in 2 weeks for reevaluation.   Ivey Nembhard L. Marchia Bond, AACFAS [facility]     [address]  [place], [place] [address]                Office [phone]  Fax [phone]

## 2023-09-18 ENCOUNTER — Ambulatory Visit: Payer: Medicare PPO | Admitting: Orthopaedic Surgery

## 2023-09-18 ENCOUNTER — Other Ambulatory Visit (INDEPENDENT_AMBULATORY_CARE_PROVIDER_SITE_OTHER): Payer: Self-pay

## 2023-09-18 ENCOUNTER — Other Ambulatory Visit (INDEPENDENT_AMBULATORY_CARE_PROVIDER_SITE_OTHER): Payer: Medicare PPO

## 2023-09-18 VITALS — Ht 65.0 in | Wt 214.0 lb

## 2023-09-18 DIAGNOSIS — M25561 Pain in right knee: Secondary | ICD-10-CM | POA: Diagnosis not present

## 2023-09-18 DIAGNOSIS — M170 Bilateral primary osteoarthritis of knee: Secondary | ICD-10-CM | POA: Diagnosis not present

## 2023-09-18 DIAGNOSIS — M25562 Pain in left knee: Secondary | ICD-10-CM

## 2023-09-18 DIAGNOSIS — M1711 Unilateral primary osteoarthritis, right knee: Secondary | ICD-10-CM

## 2023-09-18 DIAGNOSIS — G8929 Other chronic pain: Secondary | ICD-10-CM

## 2023-09-18 DIAGNOSIS — M1712 Unilateral primary osteoarthritis, left knee: Secondary | ICD-10-CM

## 2023-09-18 MED ORDER — METHYLPREDNISOLONE ACETATE 40 MG/ML IJ SUSP
40.0000 mg | INTRAMUSCULAR | Status: AC | PRN
  Administered 2023-09-18: 40 mg via INTRA_ARTICULAR

## 2023-09-18 MED ORDER — LIDOCAINE HCL 1 % IJ SOLN
3.0000 mL | INTRAMUSCULAR | Status: AC | PRN
Start: 2023-09-18 — End: 2023-09-18
  Administered 2023-09-18: 3 mL

## 2023-09-18 NOTE — Progress Notes (Signed)
The patient is a very pleasant and active 77 year old female that I am seeing for the first time.  She was actually referred to me by her cardiologist Dr. Tonny Bollman.  She is also a patient of Dr. Rodrigo Ran who is her PCP.  She comes in with significant bilateral knee pain with the left greater than right.  She has been a longtime patient of one of our other colleagues in town from an orthopedic standpoint and has had multiple injections over the years in both knees.  She says her left knee hurts her way more than the right knee and she is in need of knee replacement surgery at some point and she has been told that by the other orthopedic surgeon.  He is employed by different health system and she would like to have surgery and her orthopedic care done through physicians within the [facility] system since all of her other physicians are in the [facility] system.  She does again ambulate with a cane.  She has bilateral knee pain with most activities of daily living.  They are detrimentally affecting her mobility and her quality of life.  She is requesting steroid injections today to get her through the holidays and consider surgery in the later winter.  She has had a history of a MI in the past.  She is not diabetic.  She is not on blood thinning medications.  Her BMI is 35.61.  I was able to review all of her medications and past medical history within epic.  She currently denies any headache, chest pain, shortness of breath, fever, chills, nausea, vomiting.  On examination her left knee has valgus malalignment with significant lateral joint line tenderness and patellofemoral crepitation throughout the arc of motion.  The right knee is more neutrally aligned but does have pain throughout the arc of motion.  X-rays of both knees are seen today.  Her left knee has complete loss of joint space of the lateral compartment and patellofemoral joint with valgus malalignment and osteophytes in all 3 compartments.  It is severe  and stage arthritis with left knee.  The right knee has arthritis as well but it is not severe.  The alignment of the right knee is more neutral.  We talked in length in detail about her knees.  She did wish for steroid injection in both knees today and I agreed to this and she tolerated them well.  Will see her back in 6 weeks but no x-rays are needed.  At that visit we can talk about knee replacement surgery for her left knee and sure knee replacement model and discussed getting clearance as well for that surgery from Dr. Excell Seltzer and Dr. Waynard Edwards.    Procedure Note  Patient: Donna Williamson             Date of Birth: 05/17/1946           MRN: [id number]             Visit Date: 09/18/2023  Procedures: Visit Diagnoses:  1. Chronic pain of right knee   2. Chronic pain of left knee   3. Unilateral primary osteoarthritis, left knee   4. Unilateral primary osteoarthritis, right knee     Large Joint Inj: R knee on 09/18/2023 3:42 PM Indications: diagnostic evaluation and pain Details: 22 G 1.5 in needle, superolateral approach  Arthrogram: No  Medications: 3 mL lidocaine 1 %; 40 mg methylPREDNISolone acetate 40 MG/ML Outcome: tolerated well, no immediate complications  Procedure, treatment alternatives, risks and benefits explained, specific risks discussed. Consent was given by the patient. Immediately prior to procedure a time out was called to verify the correct patient, procedure, equipment, support staff and site/side marked as required. Patient was prepped and draped in the usual sterile fashion.    Large Joint Inj: L knee on 09/18/2023 3:42 PM Indications: diagnostic evaluation and pain Details: 22 G 1.5 in needle, superolateral approach  Arthrogram: No  Medications: 3 mL lidocaine 1 %; 40 mg methylPREDNISolone acetate 40 MG/ML Outcome: tolerated well, no immediate complications Procedure, treatment alternatives, risks and benefits explained, specific risks discussed. Consent  was given by the patient. Immediately prior to procedure a time out was called to verify the correct patient, procedure, equipment, support staff and site/side marked as required. Patient was prepped and draped in the usual sterile fashion.

## 2023-09-27 ENCOUNTER — Other Ambulatory Visit: Payer: Self-pay | Admitting: Orthopaedic Surgery

## 2023-09-27 ENCOUNTER — Ambulatory Visit: Payer: Medicare PPO | Admitting: Internal Medicine

## 2023-09-27 ENCOUNTER — Telehealth: Payer: Self-pay | Admitting: Orthopaedic Surgery

## 2023-09-27 DIAGNOSIS — R0609 Other forms of dyspnea: Secondary | ICD-10-CM

## 2023-09-27 DIAGNOSIS — J849 Interstitial pulmonary disease, unspecified: Secondary | ICD-10-CM

## 2023-09-27 DIAGNOSIS — Z8679 Personal history of other diseases of the circulatory system: Secondary | ICD-10-CM

## 2023-09-27 LAB — PULMONARY FUNCTION TEST
DL/VA % pred: 74 %
DL/VA: 3.01 ml/min/mmHg/L
DLCO cor % pred: 59 %
DLCO cor: 11.7 ml/min/mmHg
DLCO unc % pred: 59 %
DLCO unc: 11.7 ml/min/mmHg
FEF 25-75 Post: 1.96 L/s
FEF 25-75 Pre: 1.68 L/s
FEF2575-%Change-Post: 16 %
FEF2575-%Pred-Post: 126 %
FEF2575-%Pred-Pre: 108 %
FEV1-%Change-Post: 2 %
FEV1-%Pred-Post: 93 %
FEV1-%Pred-Pre: 90 %
FEV1-Post: 1.98 L
FEV1-Pre: 1.92 L
FEV1FVC-%Change-Post: 1 %
FEV1FVC-%Pred-Pre: 105 %
FEV6-%Change-Post: 1 %
FEV6-%Pred-Post: 93 %
FEV6-%Pred-Pre: 91 %
FEV6-Post: 2.5 L
FEV6-Pre: 2.46 L
FEV6FVC-%Pred-Post: 105 %
FEV6FVC-%Pred-Pre: 105 %
FVC-%Change-Post: 1 %
FVC-%Pred-Post: 88 %
FVC-%Pred-Pre: 86 %
FVC-Post: 2.5 L
FVC-Pre: 2.46 L
Post FEV1/FVC ratio: 79 %
Post FEV6/FVC ratio: 100 %
Pre FEV1/FVC ratio: 78 %
Pre FEV6/FVC Ratio: 100 %
RV % pred: 98 %
RV: 2.4 L
TLC % pred: 91 %
TLC: 4.82 L

## 2023-09-27 MED ORDER — METHYLPREDNISOLONE 4 MG PO TABS: ORAL_TABLET | ORAL | 0 refills | Status: DC

## 2023-09-27 MED ORDER — TRAMADOL HCL 50 MG PO TABS: 50.0000 mg | ORAL_TABLET | Freq: Three times a day (TID) | ORAL | 0 refills | Status: DC | PRN

## 2023-09-27 NOTE — Telephone Encounter (Signed)
Patient called. Says she is in pain. Would like someone to call her. [phone]

## 2023-09-27 NOTE — Telephone Encounter (Signed)
Called patient

## 2023-09-27 NOTE — Progress Notes (Signed)
Full PFT performed today. °

## 2023-09-27 NOTE — Patient Instructions (Signed)
Full PFT performed today. °

## 2023-10-02 ENCOUNTER — Ambulatory Visit: Payer: Medicare PPO | Admitting: Internal Medicine

## 2023-10-02 ENCOUNTER — Encounter: Payer: Self-pay | Admitting: Internal Medicine

## 2023-10-02 VITALS — BP 108/68 | HR 80 | Temp 98.2°F | Ht 65.0 in | Wt 210.0 lb

## 2023-10-02 DIAGNOSIS — J8489 Other specified interstitial pulmonary diseases: Secondary | ICD-10-CM

## 2023-10-02 DIAGNOSIS — M3502 Sicca syndrome with lung involvement: Secondary | ICD-10-CM | POA: Diagnosis not present

## 2023-10-02 DIAGNOSIS — M359 Systemic involvement of connective tissue, unspecified: Secondary | ICD-10-CM | POA: Diagnosis not present

## 2023-10-02 DIAGNOSIS — R0609 Other forms of dyspnea: Secondary | ICD-10-CM

## 2023-10-02 NOTE — Progress Notes (Signed)
OV 07/27/2023  Subjective:  Patient ID: Donna Williamson, female , DOB: 24-Apr-1946 , age 77 y.o. , MRN: [id number] , ADDRESS: [address] [place] [address] PCP Rodrigo Ran, MD Patient Care Team: Rodrigo Ran, MD as PCP - General (Internal Medicine) Tonny Bollman, MD as PCP - Cardiology (Cardiology)  This Provider for this visit: Treatment Team:  Attending Provider: Nyoka Cowden, MD    07/27/2023 -   Chief Complaint  Patient presents with   Pulmonary Consult    Referred by Dr. Rodrigo Ran. Pt c/o DOE over many years, worse x 2 years. She gets winded "most anything I do".  She has cough in the am- prod with white to yellow sputum, then dry cough throughout the day.      HPI Donna Williamson 77 y.o. -presents with her husband.  Husband is an independent historian.  The concern is for interstitial lung disease.  Is a 77 year old female suffers from obesity.  She says in 2017 she had a myocardial infarction.  I visualized the CT scan of the chest from the time she did have pleural effusion it was a angiogram protocol to rule out pulmonary embolism which she did rule out.  She had pleural effusions and bilateral lower lobe inflammation at that time..  She states after that she felt completely like a new person.  Then appears in 2019 or 2020 she did have stents and angioplasty but was doing fairly okay but then in the last few years has worsening insidious onset of shortness of breath that is slowly getting worse present with exertion relieved by rest.  She believes she might be desaturating [walk 200 feet here and pulse ox went down to 90%].  She says just walking from her kitchen to the living room makes her shortness of breath come on.  She is also got nonproductive cough indigestion and sometimes sore throat.  In early 2024 she was told she had pneumonia with scar tissue then by July 2024 chest x-ray showed resolution of the pneumonia but the scar tissue was still  there but then they did a high-resolution CT chest on her insistence.  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  There is reticulation there is no honeycombing.  Definite craniocaudal appearance but it seems more postinflammatory.  She is known to suffer from diastolic heart failure she has multiple episodes of high BNP.  She is managed by Dr. Excell Seltzer  She also sees Dr. Carolanne Grumbling for sleep apnea and is using CPAP.    CT Chest data from date: *718/24  - personally visualized and independently interpreted : ys - my findings are: as dictated with Narrative & Impression  CLINICAL DATA:  Abnormal chest radiograph   EXAM: CT CHEST WITHOUT CONTRAST   TECHNIQUE: Multidetector CT imaging of the chest was performed following the standard protocol without IV contrast.   RADIATION DOSE REDUCTION: This exam was performed according to the departmental dose-optimization program which includes automated exposure control, adjustment of the mA and/or kV according to patient size and/or use of iterative reconstruction technique.   COMPARISON:  None Available.   FINDINGS: Cardiovascular: Aortic atherosclerosis. Mild cardiomegaly. Extensive three-vessel coronary artery calcifications no pericardial effusion.   Mediastinum/Nodes: No enlarged mediastinal, hilar, or axillary lymph nodes. Thyroid gland, trachea, and esophagus demonstrate no significant findings.   Lungs/Pleura: Mild pulmonary fibrosis in a pattern with apical to basal gradient,  however with heterogeneous areas involvement, for example in the right upper lobe, featuring irregular peripheral interstitial opacity, septal thickening, mild traction bronchiectasis in the lung bases, as well as small areas of subpleural bronchiolectasis without clear evidence of honeycombing. No pleural effusion or pneumothorax.   Upper Abdomen: No acute abnormality.    Musculoskeletal: No chest wall abnormality. No acute osseous findings.   IMPRESSION: 1. Mild pulmonary fibrosis in a pattern with apical to basal gradient, however with heterogeneous areas involvement, featuring irregular peripheral interstitial opacity, septal thickening, mild traction bronchiectasis in the lung bases, as well as small areas of subpleural bronchiolectasis without clear evidence of honeycombing. 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 Am Rosezetta Schlatter Crit Care Med Vol 198, Iss 5, [phone], Jul 01 2017. 2. Coronary artery disease. 3. Aortic atherosclerosis.   Aortic Atherosclerosis (ICD10-I70.0).     Electronically Signed   By: Jearld Lesch M.D.   On: 05/18/2023 15:16     OV 10/02/2023  Subjective:  Patient ID: Donna Williamson, female , DOB: 1946-07-08 , age 77 y.o. , MRN: [id number] , ADDRESS: [address] [place] [address] PCP Rodrigo Ran, MD Patient Care Team: Rodrigo Ran, MD as PCP - General (Internal Medicine) Tonny Bollman, MD as PCP - Cardiology (Cardiology)  This Provider for this visit: Treatment Team:  Attending Provider: Kalman Shan, MD    10/02/2023 -   Chief Complaint  Patient presents with   Follow-up    SOB with exertion and occ prod cough with clear sputum.    #Interstitial lung disease -workup in progress HPI Donna Williamson 77 y.o. -presents with her husband here to discuss test results.  In the interim on 09/18/2023 did see Dr. Doneen Poisson for pain in the right knee.  She took a week of prednisone that is completing today.  This might of helped her shortness of breath but otherwise no overall change no hospitalizations no ER visits no change in overall symptoms.  She did complete the ILD questionnaire and also the blood work and the results are as follows     Development worker, community ILD Questionnaire  Symptoms:  x SYMPTOM SCALE - ILD 10/02/2023  Current weight   O2 use ra  Shortness of Breath 0 -> 5 scale with 5 being worst (score 6 If unable to do)  At rest 0  Simple tasks - showers, clothes change, eating, shaving 4  Household (dishes, doing bed, laundry) 3  Shopping 3  Walking level at own pace 2  Walking up Stairs 5  Total (30-36) Dyspnea Score 17      Non-dyspnea symptoms (0-> 5 scale) 10/02/2023  How bad is your cough? 0  How bad is your fatigue 3  How bad is nausea 0  How bad is vomiting?  0  How bad is diarrhea? 0  How bad is anxiety? 00  How bad is depression 0  Any chronic pain - if so where and how bad 0     Past Medical History :  -She has been on Symbicort for unclear reasons.  She says she has a diagnosis of COPD since 2017 although the CT chest itself does not show that.  She is also seeing allergist Dr. [place] Callas.  Symbicort was started by primary care.  She says allergy test was negative.   That she has diastolic heart failure and she sees cardiology for this  -Actually  on today that she has Sjogren's disease since 69s.  She has dry eyes for which she has had lacrimal duct surgery.  She also dry mouth is a chronic issue.  She does not have a rheumatologist but she does see [facility].   - Does have a history of sleep apnea   - Denies any pulmonary hypertension.  No diabetes no thyroid disease no stroke no tuberculosis no kidney disease   -She did have COVID-vaccine.  She has had COVID disease 3 years ago.  ROS:  -Sicca symptoms present.  She uses lemon drops and teardrops  FAMILY HISTORY of LUNG DISEASE:  = She has family history of COPD.  Asthma in the father and COPD in the father.  PERSONAL EXPOSURE HISTORY:  -She got exposed to passive smoking by her ex-husband.  Unclear duration.  No vaping marijuana no cocaine no intravenous drug use  HOME  EXPOSURE and HOBBY DETAILS :  -[place]  [place] last [place] years.  Age of the home was built in 67.  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  Has worked in Database administrator.  Otherwise detail organic and inorganic antigen exposure history is negative  PULMONARY TOXICITY HISTORY (27 items):  Negative  INVESTIGATIONS: Strongly positive for ANA and SSA consistent with Sjogren's  High-resolution CT chest again which personally visualized.  This time I Agree there is probable UIP versus indeterminate.  But there is definite ILD pattern there.   PFT with slight reduction in DLCO only.    PFT     Latest Ref Rng & Units 09/27/2023   12:50 PM  PFT Results  FVC-Pre L 2.46  P  FVC-Predicted Pre % 86  P  FVC-Post L 2.50  P  FVC-Predicted Post % 88  P  Pre FEV1/FVC % % 78  P  Post FEV1/FCV % % 79  P  FEV1-Pre L 1.92  P  FEV1-Predicted Pre % 90  P  FEV1-Post L 1.98  P  DLCO uncorrected ml/min/mmHg 11.70  P  DLCO UNC% % 59  P  DLCO corrected ml/min/mmHg 11.70  P  DLCO COR %Predicted % 59  P  DLVA Predicted % 74  P  TLC L 4.82  P  TLC % Predicted % 91  P  RV % Predicted % 98  P    P Preliminary result        LAB RESULTS last 96 hours No results found.  LAB RESULTS last 90 days Recent Results (from the past 2160 hour(s))  Anti-DNA antibody, double-stranded     Status: None   Collection Time: 07/27/23 12:31 PM  Result Value Ref Range   ds DNA Ab <1 IU/mL    Comment:                            IU/mL       Interpretation                            < or = 4    Negative                            5-9         Indeterminate                            >  or = 10   Positive .   Antinuclear Antib (ANA)     Status: Abnormal   Collection Time: 07/27/23 12:31 PM  Result Value Ref Range   Anti Nuclear Antibody (ANA) POSITIVE (A) NEGATIVE     Comment: ANA IFA is a first line screen for detecting the presence of up to approximately 150 autoantibodies in various autoimmune diseases. A positive ANA IFA result is suggestive of autoimmune disease and reflexes to titer and pattern. Further laboratory testing may be considered if clinically indicated. . For additional information, please refer to http://education.[facility].com/faq/FAQ177 (This link is being provided for informational/ educational purposes only.) .   Rheumatoid Factor     Status: None   Collection Time: 07/27/23 12:31 PM  Result Value Ref Range   Rheumatoid fact SerPl-aCnc 10 <14 IU/mL  CK Total (and CKMB)     Status: None   Collection Time: 07/27/23 12:31 PM  Result Value Ref Range   Total CK 46 29 - 143 U/L   CK, MB 1.1 0 - 5.0 ng/mL   Relative Index 2.4 0 - 4.0  Aldolase     Status: None   Collection Time: 07/27/23 12:31 PM  Result Value Ref Range   Aldolase 3.9 < OR = 8.1 U/L  Hypersensitivity Pneumonitis     Status: None   Collection Time: 07/27/23 12:31 PM  Result Value Ref Range   A.Fumigatus #1 Abs Negative Negative   Micropolyspora faeni, IgG Negative Negative   Thermoactinomyces vulgaris, IgG Negative Negative   A. Pullulans Abs Negative Negative   Thermoact. Saccharii Negative Negative   Pigeon Serum Abs Negative Negative  QuantiFERON-TB Gold Plus     Status: None   Collection Time: 07/27/23 12:31 PM  Result Value Ref Range   QuantiFERON-TB Gold Plus NEGATIVE NEGATIVE    Comment: Negative test result. M. tuberculosis complex  infection unlikely.    NIL 0.07 IU/mL   Mitogen-NIL >10.00 IU/mL   TB1-NIL 0.04 IU/mL   TB2-NIL 0.01 IU/mL    Comment: . The Nil tube value reflects the background interferon gamma immune response of the patient's blood sample. This value has been subtracted from the patient's displayed TB and Mitogen results. . Lower than expected results with the Mitogen tube prevent false-negative Quantiferon  readings by detecting a patient with a potential immune suppressive condition and/or suboptimal pre-analytical specimen handling. . The TB1 Antigen tube is coated with the M. tuberculosis-specific antigens designed to elicit responses from TB antigen primed CD4+ helper T-lymphocytes. . The TB2 Antigen tube is coated with the M. tuberculosis-specific antigens designed to elicit responses from TB antigen primed CD4+ helper and CD8+ cytotoxic T-lymphocytes. . For additional information, please refer to https://education.[facility].com/faq/FAQ204 (This link is being provided for informational/ educational purposes only.) .   B Nat Peptide     Status: Abnormal   Collection Time: 07/27/23 12:31 PM  Result Value Ref Range   Pro B Natriuretic peptide (BNP) 118.0 (H) 0.0 - 100.0 pg/mL  Anti-scleroderma antibody     Status: None   Collection Time: 07/27/23 12:31 PM  Result Value Ref Range   Scleroderma (Scl-70) (ENA) Antibody, IgG <1.0 NEG <1.0 NEG AI  Sed Rate (ESR)     Status: Abnormal   Collection Time: 07/27/23 12:31 PM  Result Value Ref Range   Sed Rate 80 (H) 0 - 30 mm/hr  Sjogren's syndrome antibods(ssa + ssb)     Status: Abnormal   Collection Time: 07/27/23 12:31 PM  Result Value Ref Range  SSA (Ro) (ENA) Antibody, IgG >8.0 POS (A) <1.0 NEG AI   SSB (La) (ENA) Antibody, IgG <1.0 NEG <1.0 NEG AI  Anti-nuclear ab-titer (ANA titer)     Status: Abnormal   Collection Time: 07/27/23 12:31 PM  Result Value Ref Range   ANA Titer 1 1:320 (H) titer    Comment:                 Reference Range                 <1:40        Negative                 1:40-1:80    Low Antibody Level                 >1:80        Elevated Antibody Level .    ANA Pattern 1 Nuclear, Speckled (A)     Comment: Speckled pattern is associated with mixed connective tissue disease (MCTD), systemic lupus erythematosus (SLE), Sjogren's syndrome, dermatomyositis, and  systemic sclerosis/polymyositis  overlap. . AC-2,4,5,29: Speckled . International Consensus on ANA Patterns ([email or web address])   Pulmonary function test     Status: None (Preliminary result)   Collection Time: 09/27/23 12:50 PM  Result Value Ref Range   FVC-Pre 2.46 L   FVC-%Pred-Pre 86 %   FVC-Post 2.50 L   FVC-%Pred-Post 88 %   FVC-%Change-Post 1 %   FEV1-Pre 1.92 L   FEV1-%Pred-Pre 90 %   FEV1-Post 1.98 L   FEV1-%Pred-Post 93 %   FEV1-%Change-Post 2 %   FEV6-Pre 2.46 L   FEV6-%Pred-Pre 91 %   FEV6-Post 2.50 L   FEV6-%Pred-Post 93 %   FEV6-%Change-Post 1 %   Pre FEV1/FVC ratio 78 %   FEV1FVC-%Pred-Pre 105 %   Post FEV1/FVC ratio 79 %   FEV1FVC-%Change-Post 1 %   Pre FEV6/FVC Ratio 100 %   FEV6FVC-%Pred-Pre 105 %   Post FEV6/FVC ratio 100 %   FEV6FVC-%Pred-Post 105 %   FEF 25-75 Pre 1.68 L/sec   FEF2575-%Pred-Pre 108 %   FEF 25-75 Post 1.96 L/sec   FEF2575-%Pred-Post 126 %   FEF2575-%Change-Post 16 %   RV 2.40 L   RV % pred 98 %   TLC 4.82 L   TLC % pred 91 %   DLCO unc 11.70 ml/min/mmHg   DLCO unc % pred 59 %   DLCO cor 11.70 ml/min/mmHg   DLCO cor % pred 59 %   DL/VA 9.56 ml/min/mmHg/L   DL/VA % pred 74 %         has a past medical history of Anemia, Anginal pain (HCC), Anxiety, Arthritis, Asthma, CAD (coronary artery disease), Chest pain, CHF (congestive heart failure) (HCC), Chronic bronchitis (HCC), Chronic lower back pain, COPD (chronic obstructive pulmonary disease) (HCC), GERD (gastroesophageal reflux disease), History of blood transfusion (1960s), Hypercholesterolemia, Hypertension, Lower extremity edema, Mild aortic stenosis (03/07/2023), Mitral valve prolapse, NSTEMI (non-ST elevated myocardial infarction) (HCC) (05/01/2016), Pneumonia (05/01/2016), Seasonal allergies, Sjogren's syndrome (HCC), and Wheezing.   reports that she has never smoked. She has never used smokeless tobacco.  Past Surgical History:  Procedure Laterality Date   ABDOMINAL HYSTERECTOMY   1960s   "partial"   CARDIAC CATHETERIZATION N/A 05/02/2016   Procedure: Right/Left Heart Cath and Coronary Angiography;  Surgeon: Corky Crafts, MD;  Location: [facility] INVASIVE CV LAB;  Service: Cardiovascular;  Laterality: N/A;   CARDIAC CATHETERIZATION N/A 05/02/2016   Procedure: Coronary Stent Intervention;  Surgeon: Corky Crafts, MD;  Location: [facility] INVASIVE CV LAB;  Service: Cardiovascular;  Laterality: N/A;   CATARACT EXTRACTION W/PHACO Right 09/20/2019   Procedure: CATARACT EXTRACTION PHACO AND INTRAOCULAR LENS PLACEMENT (IOC) RIGHT;  Surgeon: Galen Manila, MD;  Location: [facility] ORS;  Service: Ophthalmology;  Laterality: Right;  [place] 01:29.4 CDE 19.28 FLUID PACK LOT # [id number]   CATARACT EXTRACTION W/PHACO Left 10/15/2019   Procedure: CATARACT EXTRACTION PHACO AND INTRAOCULAR LENS PLACEMENT (IOC) LEFT 8.76, 00:48.8;  Surgeon: Galen Manila, MD;  Location: [facility];  Service: Ophthalmology;  Laterality: Left;   COLONOSCOPY W/ BIOPSIES AND POLYPECTOMY     CORONARY ANGIOPLASTY WITH STENT PLACEMENT  05/02/2016   "1 stent"   CORONARY PRESSURE/FFR STUDY N/A 02/17/2021   Procedure: INTRAVASCULAR PRESSURE WIRE/FFR STUDY;  Surgeon: Kathleene Hazel, MD;  Location: [facility] INVASIVE CV LAB;  Service: Cardiovascular;  Laterality: N/A;   CORONARY STENT INTERVENTION N/A 02/17/2021   Procedure: CORONARY STENT INTERVENTION;  Surgeon: Kathleene Hazel, MD;  Location: [facility] INVASIVE CV LAB;  Service: Cardiovascular;  Laterality: N/A;   ESOPHAGOGASTRODUODENOSCOPY     JOINT REPLACEMENT Left 2007   hip   LAPAROSCOPIC SALPINGOOPHERECTOMY Bilateral ~ 1990   LEFT HEART CATH AND CORONARY ANGIOGRAPHY N/A 02/17/2021   Procedure: LEFT HEART CATH AND CORONARY ANGIOGRAPHY;  Surgeon: Kathleene Hazel, MD;  Location: [facility] INVASIVE CV LAB;  Service: Cardiovascular;  Laterality: N/A;   TOTAL HIP ARTHROPLASTY Left 2007    Allergies  Allergen Reactions   Levofloxacin Anaphylaxis   Plavix  [Clopidogrel Bisulfate] Rash   Ciprofibrate     Other reaction(s): swelling, muscle aches   Codeine Nausea Only   Drug Ingredient [Ticagrelor] Other (See Comments)    Patient reports hemorrhage after medical trial of brilinta in 2017   Nitrofurantoin Other (See Comments)    unknown VISION CHANGES   Ciprofloxacin Rash    Other Reaction(s): nausea and vomiting   Penicillins Rash    Has patient had a PCN reaction causing immediate rash, facial/tongue/throat swelling, SOB or lightheadedness with hypotension: No Has patient had a PCN reaction causing severe rash involving mucus membranes or skin necrosis: No Has patient had a PCN reaction that required hospitalization: No Has patient had a PCN reaction occurring within the last 10 years: No If all of the above answers are "NO", then may proceed with Cephalosporin use.   Sulfa Antibiotics Rash    Immunization History  Administered Date(s) Administered   Influenza-Unspecified 07/01/2022, 07/02/2023   Pneumococcal-Unspecified 07/01/2014, 04/30/2022   Respiratory Syncytial Virus Vaccine,Recomb Aduvanted(Arexvy) 08/31/2022   Td 09/10/2019   Tdap 08/23/2015   Unspecified SARS-COV-2 Vaccination 11/25/2019, 12/16/2019, 08/01/2020, 07/16/2021, 04/07/2022, 07/31/2022   Zoster Recombinant(Shingrix) 01/31/2018, 05/07/2018    Family History  Problem Relation Age of Onset   Hypertension Mother    Stroke Mother    Breast cancer Mother    Heart disease Father    Emphysema Father        smoked   Asthma Father    Ovarian cancer Sister      Current Outpatient Medications:    albuterol (PROVENTIL) (2.5 MG/3ML) 0.083% nebulizer solution, Take 3 mLs (2.5 mg total) by nebulization every 2 (two) hours as needed for wheezing or shortness of breath., Disp: 75 mL, Rfl: 2   ALPRAZolam (XANAX) 0.5 MG tablet, Take 0.5 mg by mouth daily as needed for anxiety. , Disp: , Rfl:    aspirin EC 81 MG tablet, Take 81 mg by mouth daily., Disp: , Rfl:  budesonide-formoterol (SYMBICORT) 160-4.5 MCG/ACT inhaler, Inhale 2 puffs into the lungs 2 (two) times daily., Disp: , Rfl:    carboxymethylcellulose (REFRESH PLUS) 0.5 % SOLN, Place 1 drop into both eyes daily as needed (dry eyes)., Disp: , Rfl:    Cholecalciferol (VITAMIN D3) 2000 UNITS TABS, Take 2,000 Units by mouth daily., Disp: , Rfl:    clobetasol ointment (TEMOVATE) 0.05 %, Apply 1 Application topically as needed (for itching)., Disp: , Rfl:    cyanocobalamin 500 MCG tablet, Take 1,000 mcg by mouth daily., Disp: , Rfl:    diltiazem (CARDIZEM CD) 240 MG 24 hr capsule, Take 1 capsule (240 mg total) by mouth daily., Disp: 90 capsule, Rfl: 3   EPIPEN 2-PAK 0.3 MG/0.3ML SOAJ injection, Inject 0.3 mg into the skin daily as needed (allergic reaction). , Disp: , Rfl:    estradiol (ESTRACE) 0.5 MG tablet, Take 0.5 mg by mouth daily., Disp: , Rfl:    ezetimibe (ZETIA) 10 MG tablet, Take 10 mg by mouth daily., Disp: , Rfl:    FLUoxetine (PROZAC) 10 MG capsule, Take 10 mg by mouth daily., Disp: , Rfl:    fluticasone (FLONASE) 50 MCG/ACT nasal spray, Place 2 sprays into the nose daily., Disp: , Rfl:    furosemide (LASIX) 20 MG tablet, Take 1 tablet (20 mg total) by mouth daily., Disp: 90 tablet, Rfl: 3   gabapentin (NEURONTIN) 100 MG capsule, Take 100 mg by mouth at bedtime. , Disp: , Rfl:    inclisiran (LEQVIO) 284 MG/1.5ML SOSY injection, Inject 284 mg into the skin once., Disp: , Rfl:    isosorbide mononitrate (IMDUR) 30 MG 24 hr tablet, TAKE 1 TABLET(30 MG) BY MOUTH DAILY, Disp: 90 tablet, Rfl: 3   methylPREDNISolone (MEDROL) 4 MG tablet, Medrol dose pack. Take as instructed, Disp: 21 tablet, Rfl: 0   metoprolol tartrate (LOPRESSOR) 25 MG tablet, TAKE 1/2 TABLET(12.5 MG) BY MOUTH TWICE DAILY, Disp: 90 tablet, Rfl: 3   montelukast (SINGULAIR) 10 MG tablet, Take 10 mg by mouth at bedtime., Disp: , Rfl:    Multiple Vitamin (MULTIVITAMIN WITH MINERALS) TABS tablet, Take 1 tablet by mouth daily., Disp: ,  Rfl:    nitroGLYCERIN (NITROSTAT) 0.4 MG SL tablet, Place 1 tablet (0.4 mg total) under the tongue every 5 (five) minutes as needed for chest pain (up to 3 doses. If taking 3rd dose call 911)., Disp: 25 tablet, Rfl: 3   pantoprazole (PROTONIX) 40 MG tablet, TAKE 2 TABLETS(80 MG) BY MOUTH DAILY, Disp: 180 tablet, Rfl: 3   potassium chloride (KLOR-CON) 10 MEQ tablet, Take 10 mEq by mouth 2 (two) times a week. Take 1 tablet 2 times a week, Disp: , Rfl:    PROAIR HFA 108 (90 BASE) MCG/ACT inhaler, Inhale 1-2 puffs into the lungs every 4 (four) hours as needed for wheezing., Disp: , Rfl:    rosuvastatin (CRESTOR) 20 MG tablet, Take 1 tablet (20 mg total) by mouth daily at 6 PM., Disp: 30 tablet, Rfl: 0   traMADol (ULTRAM) 50 MG tablet, Take 1-2 tablets (50-100 mg total) by mouth 3 (three) times daily as needed., Disp: 30 tablet, Rfl: 0      Objective:   Vitals:   10/02/23 1346  BP: 108/68  Pulse: 80  Temp: 98.2 F (36.8 C)  TempSrc: Temporal  SpO2: 92%  Weight: 210 lb (95.3 kg)  Height: 5\' 5"  (1.651 m)    Estimated body mass index is 34.95 kg/m as calculated from the following:   Height as of  this encounter: 5\' 5"  (1.651 m).   Weight as of this encounter: 210 lb (95.3 kg).  @WEIGHTCHANGE @  [employer]   10/02/23 1346  Weight: 210 lb (95.3 kg)     Physical Exam   General: No distress. Looks well O2 at rest: no Cane present: YES Sitting in wheel chair: no Frail: no Obese: YES Neuro: Alert and Oriented x 3. GCS 15. Speech normal Psych: Pleasant Resp:  Barrel Chest - no.  Wheeze - no, Crackles - no, No overt respiratory distress CVS: Normal heart sounds. Murmurs - no Ext: Stigmata of Connective Tissue Disease - no HEENT: Normal upper airway. PEERL +. No post nasal drip        Assessment:       ICD-10-CM   1. DOE (dyspnea on exertion)  R06.09 Pulmonary function test    2. Interstitial lung disease due to connective tissue disease (HCC)  J84.89 Pulmonary function  test   M35.9     3. Sjogren's syndrome with lung involvement (HCC) and eye  M35.02          Plan:     Patient Instructions     ICD-10-CM   1. 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 Sjogren's syndrome with lung involvement (HCC) and eye  M35.02       I think yyou have r mild interstitial lung disease secondary to Sjogren's syndrome Doubt you have aiway disease  Shortness of breath itself is multifactorial and the lung is only 1 contributor.  Plan  - At this point in time I do not recommend antifibrotic  -Consider recommend a monitoring approach - I will discuss in a case conference about overall CT-based progression - Meanwhile I think you should see a rheumatologist and see if you should be on any immunosuppressants because you still have dry eye and dry mouth  -Refer [facility] rheumatology Dr Corliss Skains  or Dr Dimple Casey -> if the delay is too long then referred to [facility] -Please to work with cardiology and primary care about cardiac and also weight related issues contributing to shortness of breath - ok to hold off symbicort for some weeks and reassess  Follow-up - Spirometry and DLCO in 4 months - Return to see Dr. Marchelle Gearing in 4 months for 15-minute visit   FOLLOWUP Return in about 4 months (around 01/31/2024) for 15 min visit, ILD, with Dr Marchelle Gearing, Face to Face Visit.  ( Level 05 visit E&M 2024: Estb >= 40 min   visit type: on-site physical face to visit  in total care time and counseling or/and coordination of care by this undersigned MD - Dr Kalman Shan. This includes one or more of the following on this same day 10/02/2023: pre-charting, chart review, note writing, documentation discussion of test results, diagnostic or treatment recommendations, prognosis, risks and benefits of management options, instructions, education, compliance or risk-factor  reduction. It excludes time spent by the CMA or office staff in the care of the patient. Actual time 40 min)   SIGNATURE    Dr. Kalman Shan, M.D., F.C.C.P,  Pulmonary and Critical Care Medicine Staff Physician, [facility] Center Director - Interstitial Lung Disease  Program  Pulmonary Fibrosis [facility] at [facility] [place], [place], [address]  Pager: [phone], If no answer or between  15:00h - 7:00h: call [phone] Telephone: [phone]  5:46 PM 10/02/2023

## 2023-10-02 NOTE — Patient Instructions (Addendum)
ICD-10-CM   1. 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 Sjogren's syndrome with lung involvement (HCC) and eye  M35.02       I think yyou have r mild interstitial lung disease secondary to Sjogren's syndrome Doubt you have aiway disease  Shortness of breath itself is multifactorial and the lung is only 1 contributor.  Plan  - At this point in time I do not recommend antifibrotic  -Consider recommend a monitoring approach - I will discuss in a case conference about overall CT-based progression - Meanwhile I think you should see a rheumatologist and see if you should be on any immunosuppressants because you still have dry eye and dry mouth  -Refer [facility] rheumatology Dr Corliss Skains  or Dr Dimple Casey -> if the delay is too long then referred to [facility] -Please to work with cardiology and primary care about cardiac and also weight related issues contributing to shortness of breath - ok to hold off symbicort for some weeks and reassess  Follow-up - Spirometry and DLCO in 4 months - Return to see Dr. Marchelle Gearing in 4 months for 15-minute visit

## 2023-10-06 ENCOUNTER — Ambulatory Visit: Payer: Medicare PPO | Admitting: Podiatry

## 2023-10-06 DIAGNOSIS — Z1231 Encounter for screening mammogram for malignant neoplasm of breast: Secondary | ICD-10-CM | POA: Diagnosis not present

## 2023-10-10 ENCOUNTER — Ambulatory Visit: Payer: Self-pay | Admitting: Internal Medicine

## 2023-10-12 ENCOUNTER — Telehealth: Payer: Self-pay | Admitting: Cardiovascular Disease

## 2023-10-12 DIAGNOSIS — M79604 Pain in right leg: Secondary | ICD-10-CM | POA: Diagnosis not present

## 2023-10-12 DIAGNOSIS — M5416 Radiculopathy, lumbar region: Secondary | ICD-10-CM | POA: Diagnosis not present

## 2023-10-12 DIAGNOSIS — M170 Bilateral primary osteoarthritis of knee: Secondary | ICD-10-CM | POA: Diagnosis not present

## 2023-10-12 DIAGNOSIS — I129 Hypertensive chronic kidney disease with stage 1 through stage 4 chronic kidney disease, or unspecified chronic kidney disease: Secondary | ICD-10-CM | POA: Diagnosis not present

## 2023-10-12 DIAGNOSIS — F4321 Adjustment disorder with depressed mood: Secondary | ICD-10-CM | POA: Diagnosis not present

## 2023-10-12 DIAGNOSIS — F331 Major depressive disorder, recurrent, moderate: Secondary | ICD-10-CM | POA: Diagnosis not present

## 2023-10-12 NOTE — Telephone Encounter (Signed)
Pt c/o medication issue:  1. Name of Medication:  diltiazem (CARDIZEM CD) 240 MG 24 hr capsule  2. How are you currently taking this medication (dosage and times per day)?   3. Are you having a reaction (difficulty breathing--STAT)?   4. What is your medication issue?    Patient states this morning at her PCP BP was 98/50 and PCP decreased it to 120 MG daily. Patient was advised to inform Dr. Excell Seltzer.

## 2023-10-12 NOTE — Telephone Encounter (Signed)
Ok thanks for letting me know

## 2023-10-16 ENCOUNTER — Other Ambulatory Visit (HOSPITAL_COMMUNITY): Payer: Self-pay

## 2023-10-17 ENCOUNTER — Ambulatory Visit (HOSPITAL_COMMUNITY)
Admission: RE | Admit: 2023-10-17 | Discharge: 2023-10-17 | Disposition: A | Discharge status: Home / Self Care | Payer: Medicare PPO | Source: Ambulatory Visit | Attending: Internal Medicine | Admitting: Internal Medicine

## 2023-10-17 DIAGNOSIS — E785 Hyperlipidemia, unspecified: Secondary | ICD-10-CM | POA: Diagnosis not present

## 2023-10-17 DIAGNOSIS — I2510 Atherosclerotic heart disease of native coronary artery without angina pectoris: Secondary | ICD-10-CM | POA: Insufficient documentation

## 2023-10-17 MED ORDER — INCLISIRAN SODIUM 284 MG/1.5ML ~~LOC~~ SOSY
PREFILLED_SYRINGE | SUBCUTANEOUS | Status: AC
  Filled 2023-10-17: qty 1.5

## 2023-10-17 MED ORDER — INCLISIRAN SODIUM 284 MG/1.5ML ~~LOC~~ SOSY
284.0000 mg | PREFILLED_SYRINGE | Freq: Once | SUBCUTANEOUS | Status: AC
  Administered 2023-10-17: 284 mg via SUBCUTANEOUS

## 2023-10-17 NOTE — Progress Notes (Signed)
Spoke with Donna Williamson at Dr. Therisa Doyne office about our order only being authorized through 06/2023. Patient stated she has been approved and she has the letter and per Dr. Waynard Edwards we were okay to give injection and office will fax new order.

## 2023-10-18 DIAGNOSIS — M25561 Pain in right knee: Secondary | ICD-10-CM | POA: Diagnosis not present

## 2023-10-18 DIAGNOSIS — M25562 Pain in left knee: Secondary | ICD-10-CM | POA: Diagnosis not present

## 2023-10-20 NOTE — Progress Notes (Cosign Needed)
Interstitial Lung Disease Multidisciplinary Conference   Donna Williamson    MRN [id number]    DOB 07/28/1946  Primary Care Physician:Perini, Loraine Leriche, MD  Referring Physician: Dr. Marchelle Gearing  Time of Conference: 7.00am- 8.00am Date of conference: 10/10/2023 Location of Conference: -  Virtual  Participating Pulmonary: Dr. Kalman Shan, Dr Chilton Greathouse Pathology: - Radiology: Dr Lauralyn Primes  Others:   Brief History: Has mild ILD due to Sjogren. Is it progressive on CT?   PFT    Latest Ref Rng & Units 09/27/2023   12:50 PM  PFT Results  FVC-Pre L 2.46   FVC-Predicted Pre % 86   FVC-Post L 2.50   FVC-Predicted Post % 88   Pre FEV1/FVC % % 78   Post FEV1/FCV % % 79   FEV1-Pre L 1.92   FEV1-Predicted Pre % 90   FEV1-Post L 1.98   DLCO uncorrected ml/min/mmHg 11.70   DLCO UNC% % 59   DLCO corrected ml/min/mmHg 11.70   DLCO COR %Predicted % 59   DLVA Predicted % 74   TLC L 4.82   TLC % Predicted % 91   RV % Predicted % 98       MDD discussion of CT scan    - Date or time period of scan:  CT Chest wo: 05/18/2023 CT Chest angio: 09/29/2012  - Discussion synopsis:  In 2017 there was effusion/edema so cannot compare to 2024 CT. In 2013 Ct There was some edema but can compare. Now in 2024 there is new subpleural fibrosis and evidence of progression.   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  - Concordance with official report: Not commented upon.   Pathology discussion of biopsy : -    MDD Impression/Recs: Rx or Not Rx - ? There is progression over 10 year. Take shared decision making re Rx based on symptoms, PFT and very slow progression   Time Spent in preparation and discussion:  > 30 min    SIGNATURE   Dr. Kalman Shan, M.D., F.C.C.P,   Pulmonary and Critical Care Medicine Staff Physician, [facility] Center Director - Interstitial Lung Disease  Program  Pulmonary Fibrosis [facility] at [facility] [place], [place], [address]  Pager: [phone], If no answer or between  15:00h - 7:00h: call [phone] Telephone: [phone]  9:34 AM 10/20/2023 ...................................................................................................................Marland Kitchen References: Diagnosis of Hypersensitivity Pneumonitis in Adults. An Official ATS/JRS/ALAT Clinical Practice Guideline. Ragu G et al, Am J Respir Crit Care Med. 2020 Aug 1;202(3):e36-e69.       Diagnosis of Idiopathic Pulmonary Fibrosis. An Official ATS/ERS/JRS/ALAT Clinical Practice Guideline. Raghu G et al, Am J Respir Crit Care Med. 2018 Sep 1;198(5):e44-e68.   IPF Suspected   Histopath ology Pattern      UIP  Probable UIP  Indeterminate for  UIP  Alternative  diagnosis    UIP  IPF  IPF  IPF  Non-IPF dx   HRCT   Probabe UIP  IPF  IPF  IPF (Likely)**  Non-IPF dx  Pattern  Indeterminate for UIP  IPF  IPF (Likely)**  Indeterminate  for IPF**  Non-IPF dx    Alternative diagnosis  IPF (Likely)**/ non-IPF dx  Non-IPF dx  Non-IPF dx  Non-IPF dx     Idiopathic pulmonary fibrosis diagnosis  based upon HRCT and Biopsy paterns.  ** IPF is the likely diagnosis when any of following features are present:  Moderate-to-severe traction bronchiectasis/bronchiolectasis (defined as mild traction bronchiectasis/bronchiolectasis in four or more lobes including the lingual as a lobe, or moderate to severe traction bronchiectasis in two or more lobes) in a man over age 31 years or in a woman over age 62 years Extensive (>30%) reticulation on HRCT and an age >70 years  Increased neutrophils and/or absence of lymphocytosis in BAL fluid  Multidisciplinary discussion reaches a confident diagnosis of IPF.    **Indeterminate for IPF  Without an adequate biopsy is unlikely to be IPF  With an adequate biopsy may be reclassified to a more specific diagnosis after multidisciplinary discussion and/or additional consultation.   dx = diagnosis; HRCT = high-resolution computed tomography; IPF = idiopathic pulmonary fibrosis; UIP = usual interstitial pneumonia.

## 2023-10-30 ENCOUNTER — Ambulatory Visit: Payer: Medicare PPO | Admitting: Podiatry

## 2023-10-30 ENCOUNTER — Ambulatory Visit: Payer: Medicare PPO | Admitting: Orthopaedic Surgery

## 2023-10-30 ENCOUNTER — Encounter: Payer: Self-pay | Admitting: Orthopaedic Surgery

## 2023-10-30 DIAGNOSIS — M1712 Unilateral primary osteoarthritis, left knee: Secondary | ICD-10-CM | POA: Diagnosis not present

## 2023-10-30 DIAGNOSIS — G8929 Other chronic pain: Secondary | ICD-10-CM | POA: Diagnosis not present

## 2023-10-30 DIAGNOSIS — M25561 Pain in right knee: Secondary | ICD-10-CM | POA: Diagnosis not present

## 2023-10-30 DIAGNOSIS — M1711 Unilateral primary osteoarthritis, right knee: Secondary | ICD-10-CM

## 2023-10-30 DIAGNOSIS — M25562 Pain in left knee: Secondary | ICD-10-CM | POA: Diagnosis not present

## 2023-10-30 NOTE — Progress Notes (Signed)
The patient is a 77 year old female that I am seeing in follow-up as it relates to severe arthritis in both of her knees.  This has been well-documented for many years but over the orthopedic provider.  She has had multiple injections in her knees.  I have seen her before.  It is gotten to the point where her pain is on a daily basis.  She does ambulate with a cane.  Her pain is 10 out of 10 and it is detrimentally affecting her mobility, her quality of life and her actives daily living.  She is here today to discuss knee replacement surgery.  Her previous x-rays were reviewed again and that shows severe arthritis of both knees.  Her right knee has more neutral alignment and the left knee has more valgus alignment.  Her right knee hurts her slightly worse than the left knee says she would prefer to proceed with a right total knee arthroplasty first.  Her husband is with her.  She is not a diabetic and not on blood thinning medication.  She has really good notes from cardiology seeing Dr. Excell Seltzer as well as her primary care physician Dr. Darcus Austin.  They have both advocated for surgery as well.  Again we have discussed this as well and today were going over knee replacement models with her and talking about the surgery in detail.  I did review all of her past medical history and medications within epic as well.  She denies any active medical issues and denies any chest pain or shortness of breath.  Examination of both knees shows significant pain with both knees.  Her right knee is more neutral but significant patellofemoral pain and crepitation.  The right knee is more valgus alignment.  Again x-rays show arthritis that is severe in both knees and tricompartmental.  We did go over knee replacement surgery.  We discussed the risks and benefits of the surgery and what to expect from an intraoperative and postoperative standpoint.  I showed her knee replacement model and went over the surgery in detail.  We talked  about postoperative in terms of not driving with the right knee for at least 4 weeks.  This is an issue because her husband cannot drive due to retinopathy.  They do have family support and friend support.  All questions and concerns were answered addressed.  We will work on getting her on the schedule hopefully soon for right total knee arthroplasty and then we will go from there.  She knows we will be in touch.

## 2023-11-02 ENCOUNTER — Ambulatory Visit: Payer: Medicare PPO | Admitting: Podiatry

## 2023-11-02 ENCOUNTER — Encounter: Payer: Self-pay | Admitting: Podiatry

## 2023-11-02 ENCOUNTER — Telehealth: Payer: Self-pay | Admitting: Internal Medicine

## 2023-11-02 DIAGNOSIS — M76821 Posterior tibial tendinitis, right leg: Secondary | ICD-10-CM

## 2023-11-02 DIAGNOSIS — D2371 Other benign neoplasm of skin of right lower limb, including hip: Secondary | ICD-10-CM | POA: Diagnosis not present

## 2023-11-02 MED ORDER — TRIAMCINOLONE ACETONIDE 40 MG/ML IJ SUSP
20.0000 mg | Freq: Once | INTRAMUSCULAR | Status: AC
  Administered 2023-11-02: 20 mg

## 2023-11-02 NOTE — Telephone Encounter (Signed)
 PT said we were going to refer her to Dr. Dimple Casey (Rheumatologist) Pls call PT to advise action taken. Dr. Dimple Casey did not have referral.   [phone]

## 2023-11-03 ENCOUNTER — Other Ambulatory Visit: Payer: Self-pay | Admitting: *Deleted

## 2023-11-03 DIAGNOSIS — J8489 Other specified interstitial pulmonary diseases: Secondary | ICD-10-CM

## 2023-11-03 DIAGNOSIS — M3502 Sicca syndrome with lung involvement: Secondary | ICD-10-CM

## 2023-11-06 NOTE — Progress Notes (Signed)
 She presents today requesting an injection to her right foot.  She states that it is hurting.  Denies any trauma but has not had an injection in quite a while.  Objective: Vital signs are stable alert oriented x 3.  Pulses are palpable.  Majority of her symptoms are associated with osteoarthritic change at the tarsometatarsal joints medially tibialis anterior tendon insertion and posterior tibial tendon insertion as well as some fluctuance on palpation of the posterior tibial tendon as it courses proximally along the medial malleolus.  Pes planovalgus is noted.  Reactive hyperkeratotic lesion to the medial aspect of the first metatarsal phalangeal joint secondary to hallux valgus deformity.  Assessment: Benign skin lesion.  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  Hallux valgus right foot.  Plan: Discussed etiology pathology conservative surgical therapies at this point I injected dexamethasone  along the posterior tibial tendon and at the point of maximal tenderness over the posterior tibial tendon and the tibialis anterior tendon attached to the medial aspect of the foot.  Instructed her to use the cam boot or the Tri-Lock brace that she has at home.  Instructed her on shoe gear and will follow-up with her in about 6 months.

## 2023-11-08 NOTE — Telephone Encounter (Signed)
 No reply from Triage. Nothing noted anyway. Fwd to Dr. Marchelle Gearing.

## 2023-11-21 DIAGNOSIS — M6281 Muscle weakness (generalized): Secondary | ICD-10-CM | POA: Diagnosis not present

## 2023-11-21 DIAGNOSIS — M5441 Lumbago with sciatica, right side: Secondary | ICD-10-CM | POA: Diagnosis not present

## 2023-11-22 DIAGNOSIS — M5416 Radiculopathy, lumbar region: Secondary | ICD-10-CM | POA: Diagnosis not present

## 2023-11-22 DIAGNOSIS — F4321 Adjustment disorder with depressed mood: Secondary | ICD-10-CM | POA: Diagnosis not present

## 2023-11-22 DIAGNOSIS — M170 Bilateral primary osteoarthritis of knee: Secondary | ICD-10-CM | POA: Diagnosis not present

## 2023-11-22 DIAGNOSIS — F331 Major depressive disorder, recurrent, moderate: Secondary | ICD-10-CM | POA: Diagnosis not present

## 2023-11-22 DIAGNOSIS — M79604 Pain in right leg: Secondary | ICD-10-CM | POA: Diagnosis not present

## 2023-11-22 DIAGNOSIS — I129 Hypertensive chronic kidney disease with stage 1 through stage 4 chronic kidney disease, or unspecified chronic kidney disease: Secondary | ICD-10-CM | POA: Diagnosis not present

## 2023-11-22 NOTE — Telephone Encounter (Signed)
 Done NFN

## 2023-11-23 ENCOUNTER — Telehealth: Payer: Self-pay

## 2023-11-23 NOTE — Telephone Encounter (Signed)
Patient has been scheduled for telephone visit and consent is done

## 2023-11-23 NOTE — Telephone Encounter (Signed)
Patient has been scheduled for telephone visit.

## 2023-11-23 NOTE — Telephone Encounter (Signed)
   Pre-operative Risk Assessment    Patient Name: Donna Williamson  DOB: Apr 04, 1946 MRN: [id number]   Date of last office visit: 08/02/23 Date of next office visit: 02/20/24   Request for Surgical Clearance    Procedure:   Left total knee arthroplasty   Date of Surgery:  Clearance 12/26/23                                 Surgeon:  Doneen Poisson, MD  Surgeon's Group or Practice Name:  [facility]  Phone number:  [phone] Fax number:  [phone]   Type of Clearance Requested:   - Medical  - Pharmacy:  Hold Aspirin Not indicated    Type of Anesthesia:  Spinal Block    Additional requests/questions:    [name]   11/23/2023, 4:03 PM

## 2023-11-23 NOTE — Telephone Encounter (Signed)
   Name: Donna Williamson  DOB: 12-Aug-1946  MRN: [id number]  Primary Cardiologist: Tonny Bollman, MD   Preoperative team, please contact this patient and set up a phone call appointment for further preoperative risk assessment. Please obtain consent and complete medication review. Thank you for your help.  I confirm that guidance regarding antiplatelet and oral anticoagulation therapy has been completed and, if necessary, noted below.  Per protocol if patient has no new cardiac symptoms she can hold ASA 81 mg 5 to 7 days prior to procedure and should resume soon as possible postprocedure.  I also confirmed the patient resides in the state of [place]. As per [facility] Medical Board telemedicine laws, the patient must reside in the state in which the provider is licensed.   Napoleon Form, Leodis Rains, NP 11/23/2023, 4:23 PM [facility]

## 2023-11-30 ENCOUNTER — Other Ambulatory Visit: Payer: Self-pay | Admitting: Neurosurgery

## 2023-11-30 DIAGNOSIS — M5441 Lumbago with sciatica, right side: Secondary | ICD-10-CM

## 2023-12-02 ENCOUNTER — Ambulatory Visit
Admission: RE | Admit: 2023-12-02 | Discharge: 2023-12-02 | Disposition: A | Discharge status: Home / Self Care | Payer: Medicare PPO | Source: Ambulatory Visit | Attending: Neurosurgery | Admitting: Neurosurgery

## 2023-12-02 DIAGNOSIS — M545 Low back pain, unspecified: Secondary | ICD-10-CM | POA: Diagnosis not present

## 2023-12-02 DIAGNOSIS — M5441 Lumbago with sciatica, right side: Secondary | ICD-10-CM

## 2023-12-06 ENCOUNTER — Other Ambulatory Visit: Payer: Self-pay

## 2023-12-11 ENCOUNTER — Ambulatory Visit: Payer: Medicare PPO | Attending: Nurse Practitioner | Admitting: Nurse Practitioner

## 2023-12-11 DIAGNOSIS — Z01810 Encounter for preprocedural cardiovascular examination: Secondary | ICD-10-CM | POA: Diagnosis not present

## 2023-12-11 NOTE — Progress Notes (Signed)
 Virtual Visit via Telephone Note   Because of Donna Williamson's co-morbid illnesses, she is at least at moderate risk for complications without adequate follow up.  This format is felt to be most appropriate for this patient at this time.  The patient did not have access to video technology/had technical difficulties with video requiring transitioning to audio format only (telephone).  All issues noted in this document were discussed and addressed.  No physical exam could be performed with this format.  Please refer to the patient's chart for her consent to telehealth for [facility].  Evaluation Performed:  Preoperative cardiovascular risk assessment _____________   Date:  12/11/2023   Patient ID:  Donna Williamson, Donna Williamson 1946-03-01, MRN [id number] Patient Location:  Home Provider location:   Office  Primary Care Provider:  Aldo Hun, MD Primary Cardiologist:  Arnoldo Lapping, MD  Chief Complaint / Patient Profile   78 y.o. y/o female with a h/o CAD s/p DES to OM1, DES to LAD and POBA of D2, HFpEF, aortic stenosis, PSVT, HTN, hyperlipidemia, CKD, pulmonary fibrosis, Sjogren's, and sleep apnea who is pending left total knee arthroplasty with Dr. Lucienne Ryder on 12/26/23 and presents today for telephonic preoperative cardiovascular risk assessment.  History of Present Illness    Donna Williamson is a 79 y.o. female who presents via audio/video conferencing for a telehealth visit today.  Pt was last seen in cardiology clinic on 08/21/23 by Dr. Arlester Ladd.  At that time RAYAUNA PAIS was doing well.  The patient is now pending procedure as outlined above. Since her last visit, she denies chest pain, shortness of breath, lower extremity edema, fatigue, palpitations, melena, hematuria, hemoptysis, diaphoresis, weakness, presyncope, syncope, orthopnea, and PND. She is somewhat limited by knee pain but remains active and is able to achieve > 4 METS activity without concerning cardiac  symptoms.    Past Medical History    Past Medical History:  Diagnosis Date   Anemia    Anginal pain (HCC)    Anxiety    Arthritis    "feet, knees, back" (05/02/2016)   Asthma    CAD (coronary artery disease)    a. 04/2016: NSTEMI 95% stenosis 1st Mrg (s/p DES)   Chest pain    a. 10/2013 Exercise Myoview : Ef 65%, no ischemia.   CHF (congestive heart failure) (HCC)    Chronic bronchitis (HCC)    Chronic lower back pain    COPD (chronic obstructive pulmonary disease) (HCC)    GERD (gastroesophageal reflux disease)    History of blood transfusion 1960s   "related to my hysterectomy"   Hypercholesterolemia    Hypertension    Lower extremity edema    Mild aortic stenosis 03/07/2023   TTE 02/28/2023: EF 55-60, no RWMA, mild asymmetric LVH, NL RVSF, mild MR, mild aortic stenosis (bulky subvalvular calcification), mean AV gradient 9.6, V-max 199 cm/s, RAP 3   Mitral valve prolapse    NSTEMI (non-ST elevated myocardial infarction) (HCC) 05/01/2016   Pneumonia 05/01/2016   "saw trace of slight pneumonia/CT scan" (05/02/2016)   Seasonal allergies    Sjogren's syndrome (HCC)    Wheezing    Past Surgical History:  Procedure Laterality Date   ABDOMINAL HYSTERECTOMY  1960s   "partial"   CARDIAC CATHETERIZATION N/A 05/02/2016   Procedure: Right/Left Heart Cath and Coronary Angiography;  Surgeon: Lucendia Rusk, MD;  Location: [facility] INVASIVE CV LAB;  Service: Cardiovascular;  Laterality: N/A;   CARDIAC CATHETERIZATION N/A 05/02/2016   Procedure: Coronary  Stent Intervention;  Surgeon: Lucendia Rusk, MD;  Location: [facility] INVASIVE CV LAB;  Service: Cardiovascular;  Laterality: N/A;   CATARACT EXTRACTION W/PHACO Right 09/20/2019   Procedure: CATARACT EXTRACTION PHACO AND INTRAOCULAR LENS PLACEMENT (IOC) RIGHT;  Surgeon: Clair Crews, MD;  Location: [facility] ORS;  Service: Ophthalmology;  Laterality: Right;  US  01:29.4 CDE 19.28 FLUID PACK LOT # [id number]   CATARACT EXTRACTION W/PHACO Left  10/15/2019   Procedure: CATARACT EXTRACTION PHACO AND INTRAOCULAR LENS PLACEMENT (IOC) LEFT 8.76, 00:48.8;  Surgeon: Clair Crews, MD;  Location: [facility];  Service: Ophthalmology;  Laterality: Left;   COLONOSCOPY W/ BIOPSIES AND POLYPECTOMY     CORONARY ANGIOPLASTY WITH STENT PLACEMENT  05/02/2016   "1 stent"   CORONARY PRESSURE/FFR STUDY N/A 02/17/2021   Procedure: INTRAVASCULAR PRESSURE WIRE/FFR STUDY;  Surgeon: Odie Benne, MD;  Location: [facility] INVASIVE CV LAB;  Service: Cardiovascular;  Laterality: N/A;   CORONARY STENT INTERVENTION N/A 02/17/2021   Procedure: CORONARY STENT INTERVENTION;  Surgeon: Odie Benne, MD;  Location: [facility] INVASIVE CV LAB;  Service: Cardiovascular;  Laterality: N/A;   ESOPHAGOGASTRODUODENOSCOPY     JOINT REPLACEMENT Left 2007   hip   LAPAROSCOPIC SALPINGOOPHERECTOMY Bilateral ~ 1990   LEFT HEART CATH AND CORONARY ANGIOGRAPHY N/A 02/17/2021   Procedure: LEFT HEART CATH AND CORONARY ANGIOGRAPHY;  Surgeon: Odie Benne, MD;  Location: [facility] INVASIVE CV LAB;  Service: Cardiovascular;  Laterality: N/A;   TOTAL HIP ARTHROPLASTY Left 2007    Allergies  Allergies  Allergen Reactions   Levofloxacin Anaphylaxis   Plavix  [Clopidogrel  Bisulfate] Rash   Ciprofibrate     Other reaction(s): swelling, muscle aches   Codeine Nausea Only   Drug Ingredient [Ticagrelor ] Other (See Comments)    Patient reports hemorrhage after medical trial of brilinta  in 2017   Nitrofurantoin  Other (See Comments)    unknown VISION CHANGES   Ciprofloxacin Rash    Other Reaction(s): nausea and vomiting   Penicillins Rash    Has patient had a PCN reaction causing immediate rash, facial/tongue/throat swelling, SOB or lightheadedness with hypotension: No Has patient had a PCN reaction causing severe rash involving mucus membranes or skin necrosis: No Has patient had a PCN reaction that required hospitalization: No Has patient had a PCN reaction  occurring within the last 10 years: No If all of the above answers are "NO", then may proceed with Cephalosporin use.   Sulfa Antibiotics Rash    Home Medications    Prior to Admission medications   Medication Sig Start Date End Date Taking? Authorizing Provider  albuterol  (PROVENTIL ) (2.5 MG/3ML) 0.083% nebulizer solution Take 3 mLs (2.5 mg total) by nebulization every 2 (two) hours as needed for wheezing or shortness of breath. 05/04/16   Akula, Vijaya, MD  ALPRAZolam  (XANAX ) 0.5 MG tablet Take 0.5 mg by mouth daily as needed for anxiety.     [provider]  aspirin  EC 81 MG tablet Take 81 mg by mouth daily.    [provider]  budesonide-formoterol  (SYMBICORT) 160-4.5 MCG/ACT inhaler Inhale 2 puffs into the lungs 2 (two) times daily.    [provider]  carboxymethylcellulose (REFRESH PLUS) 0.5 % SOLN Place 1 drop into both eyes daily as needed (dry eyes).    [provider]  Cholecalciferol  (VITAMIN D3) 2000 UNITS TABS Take 2,000 Units by mouth daily.    [provider]  clobetasol ointment (TEMOVATE) 0.05 % Apply 1 Application topically as needed (for itching). 01/28/22   [provider]  cyanocobalamin  500 MCG tablet Take 1,000 mcg by mouth daily.    [provider]  diltiazem  (CARDIZEM  CD) 240 MG 24 hr capsule Take 1 capsule (240 mg total) by mouth daily. 03/08/21   McDaniel, Jill D, NP  EPIPEN  2-PAK 0.3 MG/0.3ML SOAJ injection Inject 0.3 mg into the skin daily as needed (allergic reaction).  11/01/13   [provider]  estradiol  (ESTRACE ) 0.5 MG tablet Take 0.5 mg by mouth daily.    [provider]  ezetimibe  (ZETIA ) 10 MG tablet Take 10 mg by mouth daily.    [provider]  FLUoxetine  (PROZAC ) 10 MG capsule Take 10 mg by mouth daily. 02/21/22   [provider]  fluticasone  (FLONASE ) 50 MCG/ACT nasal spray Place 2 sprays into the nose daily.    [provider]  furosemide  (LASIX ) 20 MG  tablet Take 1 tablet (20 mg total) by mouth daily. 01/31/23   Marlyse Single T, PA-C  gabapentin  (NEURONTIN ) 100 MG capsule Take 100 mg by mouth at bedtime.     [provider]  inclisiran (LEQVIO ) 284 MG/1.5ML SOSY injection Inject 284 mg into the skin once.    [provider]  isosorbide  mononitrate (IMDUR ) 30 MG 24 hr tablet TAKE 1 TABLET(30 MG) BY MOUTH DAILY 02/13/23   Marlyse Single T, PA-C  methylPREDNISolone  (MEDROL ) 4 MG tablet Medrol  dose pack. Take as instructed 09/27/23   Arnie Lao, MD  metoprolol  tartrate (LOPRESSOR ) 25 MG tablet TAKE 1/2 TABLET(12.5 MG) BY MOUTH TWICE DAILY 03/15/23   Arnoldo Lapping, MD  montelukast  (SINGULAIR ) 10 MG tablet Take 10 mg by mouth at bedtime.    [provider]  Multiple Vitamin (MULTIVITAMIN WITH MINERALS) TABS tablet Take 1 tablet by mouth daily.    [provider]  nitroGLYCERIN  (NITROSTAT ) 0.4 MG SL tablet Place 1 tablet (0.4 mg total) under the tongue every 5 (five) minutes as needed for chest pain (up to 3 doses. If taking 3rd dose call 911). 02/04/22   Marlyse Single T, PA-C  pantoprazole  (PROTONIX ) 40 MG tablet TAKE 2 TABLETS(80 MG) BY MOUTH DAILY 04/27/23   Arnoldo Lapping, MD  potassium chloride  (KLOR-CON ) 10 MEQ tablet Take 10 mEq by mouth 2 (two) times a week. Take 1 tablet 2 times a week 05/09/22   [provider]  PROAIR  HFA 108 (90 BASE) MCG/ACT inhaler Inhale 1-2 puffs into the lungs every 4 (four) hours as needed for wheezing. 11/01/13   [provider]  rosuvastatin  (CRESTOR ) 20 MG tablet Take 1 tablet (20 mg total) by mouth daily at 6 PM. 05/30/16   Bhagat, Bhavinkumar, PA  traMADol  (ULTRAM ) 50 MG tablet Take 1-2 tablets (50-100 mg total) by mouth 3 (three) times daily as needed. 09/27/23   Arnie Lao, MD    Physical Exam    Vital Signs:  Kerrie Peek does not have vital signs available for review today.  Given telephonic nature of communication, physical exam is  limited. AAOx3. NAD. Normal affect.  Speech and respirations are unlabored.  Accessory Clinical Findings    None  Assessment & Plan    1.  Preoperative Cardiovascular Risk Assessment: According to the Revised Cardiac Risk Index (RCRI), her Perioperative Risk of Major Cardiac Event is (%): 11. Her Functional Capacity in METs is: 5.38 according to the Duke Activity Status Index (DASI). The patient is doing well from a cardiac perspective. Therefore, based on ACC/AHA guidelines, the patient would be at acceptable risk for the planned procedure without further  cardiovascular testing.   The patient was advised that if she develops new symptoms prior to surgery to contact our office to arrange for a follow-up visit, and she verbalized understanding.  Ideally aspirin  should be continued without interruption, however if the bleeding risk is too great, aspirin  may be held for 5-7 days prior to surgery. Please resume aspirin  post operatively when it is felt to be safe from a bleeding standpoint.   A copy of this note will be routed to requesting surgeon.  Time:   Today, I have spent 10 minutes with the patient with telehealth technology discussing medical history, symptoms, and management plan.     Gerldine Koch, NP-C  12/11/2023, 10:07 AM [address], [address] Office [phone] Fax [phone]

## 2023-12-26 ENCOUNTER — Ambulatory Visit (HOSPITAL_COMMUNITY): Admit: 2023-12-26 | Payer: Medicare PPO | Admitting: Orthopaedic Surgery

## 2023-12-26 DIAGNOSIS — M1711 Unilateral primary osteoarthritis, right knee: Secondary | ICD-10-CM

## 2023-12-26 SURGERY — TOTAL KNEE ARTHROPLASTY
Anesthesia: Spinal | Site: Knee | Laterality: Right

## 2023-12-29 DIAGNOSIS — M4316 Spondylolisthesis, lumbar region: Secondary | ICD-10-CM | POA: Diagnosis not present

## 2023-12-29 DIAGNOSIS — Z6835 Body mass index (BMI) 35.0-35.9, adult: Secondary | ICD-10-CM | POA: Diagnosis not present

## 2023-12-29 DIAGNOSIS — M48062 Spinal stenosis, lumbar region with neurogenic claudication: Secondary | ICD-10-CM | POA: Diagnosis not present

## 2024-01-03 DIAGNOSIS — M25562 Pain in left knee: Secondary | ICD-10-CM | POA: Diagnosis not present

## 2024-01-03 DIAGNOSIS — M545 Low back pain, unspecified: Secondary | ICD-10-CM | POA: Diagnosis not present

## 2024-01-03 DIAGNOSIS — M25561 Pain in right knee: Secondary | ICD-10-CM | POA: Diagnosis not present

## 2024-01-08 ENCOUNTER — Encounter: Payer: Medicare PPO | Admitting: Physician Assistant

## 2024-01-09 DIAGNOSIS — M25562 Pain in left knee: Secondary | ICD-10-CM | POA: Diagnosis not present

## 2024-01-09 DIAGNOSIS — M25561 Pain in right knee: Secondary | ICD-10-CM | POA: Diagnosis not present

## 2024-01-09 DIAGNOSIS — M545 Low back pain, unspecified: Secondary | ICD-10-CM | POA: Diagnosis not present

## 2024-01-11 DIAGNOSIS — M545 Low back pain, unspecified: Secondary | ICD-10-CM | POA: Diagnosis not present

## 2024-01-11 DIAGNOSIS — M25561 Pain in right knee: Secondary | ICD-10-CM | POA: Diagnosis not present

## 2024-01-11 DIAGNOSIS — M25562 Pain in left knee: Secondary | ICD-10-CM | POA: Diagnosis not present

## 2024-01-13 ENCOUNTER — Other Ambulatory Visit: Payer: Self-pay | Admitting: Physician Assistant

## 2024-01-18 DIAGNOSIS — N1832 Chronic kidney disease, stage 3b: Secondary | ICD-10-CM | POA: Diagnosis not present

## 2024-01-18 DIAGNOSIS — J849 Interstitial pulmonary disease, unspecified: Secondary | ICD-10-CM | POA: Diagnosis not present

## 2024-01-18 DIAGNOSIS — M5416 Radiculopathy, lumbar region: Secondary | ICD-10-CM | POA: Diagnosis not present

## 2024-01-18 DIAGNOSIS — E785 Hyperlipidemia, unspecified: Secondary | ICD-10-CM | POA: Diagnosis not present

## 2024-01-18 DIAGNOSIS — I130 Hypertensive heart and chronic kidney disease with heart failure and stage 1 through stage 4 chronic kidney disease, or unspecified chronic kidney disease: Secondary | ICD-10-CM | POA: Diagnosis not present

## 2024-01-18 DIAGNOSIS — M3500 Sicca syndrome, unspecified: Secondary | ICD-10-CM | POA: Diagnosis not present

## 2024-01-18 DIAGNOSIS — D649 Anemia, unspecified: Secondary | ICD-10-CM | POA: Diagnosis not present

## 2024-01-18 DIAGNOSIS — R7301 Impaired fasting glucose: Secondary | ICD-10-CM | POA: Diagnosis not present

## 2024-01-18 DIAGNOSIS — J45909 Unspecified asthma, uncomplicated: Secondary | ICD-10-CM | POA: Diagnosis not present

## 2024-01-18 DIAGNOSIS — R058 Other specified cough: Secondary | ICD-10-CM | POA: Diagnosis not present

## 2024-01-18 DIAGNOSIS — I5032 Chronic diastolic (congestive) heart failure: Secondary | ICD-10-CM | POA: Diagnosis not present

## 2024-01-18 DIAGNOSIS — J019 Acute sinusitis, unspecified: Secondary | ICD-10-CM | POA: Diagnosis not present

## 2024-01-18 DIAGNOSIS — E7849 Other hyperlipidemia: Secondary | ICD-10-CM | POA: Diagnosis not present

## 2024-01-25 DIAGNOSIS — I5032 Chronic diastolic (congestive) heart failure: Secondary | ICD-10-CM | POA: Diagnosis not present

## 2024-01-25 DIAGNOSIS — I130 Hypertensive heart and chronic kidney disease with heart failure and stage 1 through stage 4 chronic kidney disease, or unspecified chronic kidney disease: Secondary | ICD-10-CM | POA: Diagnosis not present

## 2024-01-25 DIAGNOSIS — M3500 Sicca syndrome, unspecified: Secondary | ICD-10-CM | POA: Diagnosis not present

## 2024-01-25 DIAGNOSIS — Z0000 Encounter for general adult medical examination without abnormal findings: Secondary | ICD-10-CM | POA: Diagnosis not present

## 2024-01-25 DIAGNOSIS — N1832 Chronic kidney disease, stage 3b: Secondary | ICD-10-CM | POA: Diagnosis not present

## 2024-01-25 DIAGNOSIS — R82998 Other abnormal findings in urine: Secondary | ICD-10-CM | POA: Diagnosis not present

## 2024-01-25 DIAGNOSIS — J479 Bronchiectasis, uncomplicated: Secondary | ICD-10-CM | POA: Diagnosis not present

## 2024-01-25 DIAGNOSIS — J84112 Idiopathic pulmonary fibrosis: Secondary | ICD-10-CM | POA: Diagnosis not present

## 2024-01-25 DIAGNOSIS — I10 Essential (primary) hypertension: Secondary | ICD-10-CM | POA: Diagnosis not present

## 2024-02-07 ENCOUNTER — Emergency Department (HOSPITAL_COMMUNITY)

## 2024-02-07 ENCOUNTER — Encounter (HOSPITAL_COMMUNITY): Payer: Self-pay

## 2024-02-07 ENCOUNTER — Inpatient Hospital Stay (HOSPITAL_COMMUNITY)
Admission: EM | Admit: 2024-02-07 | Discharge: 2024-02-12 | DRG: 545 | Disposition: A | Discharge status: Home / Self Care | Attending: Internal Medicine | Admitting: Internal Medicine

## 2024-02-07 ENCOUNTER — Other Ambulatory Visit: Payer: Self-pay

## 2024-02-07 DIAGNOSIS — Z882 Allergy status to sulfonamides status: Secondary | ICD-10-CM | POA: Diagnosis not present

## 2024-02-07 DIAGNOSIS — J9601 Acute respiratory failure with hypoxia: Secondary | ICD-10-CM | POA: Diagnosis not present

## 2024-02-07 DIAGNOSIS — J189 Pneumonia, unspecified organism: Principal | ICD-10-CM | POA: Diagnosis present

## 2024-02-07 DIAGNOSIS — I2510 Atherosclerotic heart disease of native coronary artery without angina pectoris: Secondary | ICD-10-CM | POA: Diagnosis not present

## 2024-02-07 DIAGNOSIS — Z881 Allergy status to other antibiotic agents status: Secondary | ICD-10-CM

## 2024-02-07 DIAGNOSIS — G629 Polyneuropathy, unspecified: Secondary | ICD-10-CM | POA: Diagnosis present

## 2024-02-07 DIAGNOSIS — Z8249 Family history of ischemic heart disease and other diseases of the circulatory system: Secondary | ICD-10-CM

## 2024-02-07 DIAGNOSIS — I080 Rheumatic disorders of both mitral and aortic valves: Secondary | ICD-10-CM | POA: Diagnosis not present

## 2024-02-07 DIAGNOSIS — N1831 Chronic kidney disease, stage 3a: Secondary | ICD-10-CM | POA: Diagnosis not present

## 2024-02-07 DIAGNOSIS — I130 Hypertensive heart and chronic kidney disease with heart failure and stage 1 through stage 4 chronic kidney disease, or unspecified chronic kidney disease: Secondary | ICD-10-CM | POA: Diagnosis not present

## 2024-02-07 DIAGNOSIS — I252 Old myocardial infarction: Secondary | ICD-10-CM | POA: Diagnosis not present

## 2024-02-07 DIAGNOSIS — J8410 Pulmonary fibrosis, unspecified: Secondary | ICD-10-CM | POA: Diagnosis not present

## 2024-02-07 DIAGNOSIS — Z888 Allergy status to other drugs, medicaments and biological substances status: Secondary | ICD-10-CM | POA: Diagnosis not present

## 2024-02-07 DIAGNOSIS — Z880 Allergy status to penicillin: Secondary | ICD-10-CM

## 2024-02-07 DIAGNOSIS — K219 Gastro-esophageal reflux disease without esophagitis: Secondary | ICD-10-CM | POA: Diagnosis present

## 2024-02-07 DIAGNOSIS — I10 Essential (primary) hypertension: Secondary | ICD-10-CM | POA: Diagnosis not present

## 2024-02-07 DIAGNOSIS — Z6839 Body mass index (BMI) 39.0-39.9, adult: Secondary | ICD-10-CM

## 2024-02-07 DIAGNOSIS — Z955 Presence of coronary angioplasty implant and graft: Secondary | ICD-10-CM

## 2024-02-07 DIAGNOSIS — Z885 Allergy status to narcotic agent status: Secondary | ICD-10-CM

## 2024-02-07 DIAGNOSIS — Z1152 Encounter for screening for COVID-19: Secondary | ICD-10-CM | POA: Diagnosis not present

## 2024-02-07 DIAGNOSIS — M3500 Sicca syndrome, unspecified: Secondary | ICD-10-CM | POA: Diagnosis not present

## 2024-02-07 DIAGNOSIS — M3502 Sicca syndrome with lung involvement: Principal | ICD-10-CM | POA: Diagnosis present

## 2024-02-07 DIAGNOSIS — E66811 Obesity, class 1: Secondary | ICD-10-CM | POA: Diagnosis present

## 2024-02-07 DIAGNOSIS — R0602 Shortness of breath: Secondary | ICD-10-CM | POA: Diagnosis not present

## 2024-02-07 DIAGNOSIS — E7800 Pure hypercholesterolemia, unspecified: Secondary | ICD-10-CM | POA: Diagnosis present

## 2024-02-07 DIAGNOSIS — J440 Chronic obstructive pulmonary disease with acute lower respiratory infection: Secondary | ICD-10-CM | POA: Diagnosis present

## 2024-02-07 DIAGNOSIS — Z7951 Long term (current) use of inhaled steroids: Secondary | ICD-10-CM

## 2024-02-07 DIAGNOSIS — Z8701 Personal history of pneumonia (recurrent): Secondary | ICD-10-CM

## 2024-02-07 DIAGNOSIS — I509 Heart failure, unspecified: Secondary | ICD-10-CM | POA: Diagnosis present

## 2024-02-07 DIAGNOSIS — Z825 Family history of asthma and other chronic lower respiratory diseases: Secondary | ICD-10-CM

## 2024-02-07 DIAGNOSIS — R0682 Tachypnea, not elsewhere classified: Secondary | ICD-10-CM | POA: Diagnosis not present

## 2024-02-07 DIAGNOSIS — Z803 Family history of malignant neoplasm of breast: Secondary | ICD-10-CM

## 2024-02-07 DIAGNOSIS — Z96642 Presence of left artificial hip joint: Secondary | ICD-10-CM | POA: Diagnosis present

## 2024-02-07 DIAGNOSIS — R0902 Hypoxemia: Secondary | ICD-10-CM | POA: Diagnosis not present

## 2024-02-07 DIAGNOSIS — Z823 Family history of stroke: Secondary | ICD-10-CM

## 2024-02-07 DIAGNOSIS — J849 Interstitial pulmonary disease, unspecified: Secondary | ICD-10-CM | POA: Diagnosis not present

## 2024-02-07 DIAGNOSIS — Z87892 Personal history of anaphylaxis: Secondary | ICD-10-CM

## 2024-02-07 DIAGNOSIS — R918 Other nonspecific abnormal finding of lung field: Secondary | ICD-10-CM | POA: Diagnosis not present

## 2024-02-07 DIAGNOSIS — R0689 Other abnormalities of breathing: Secondary | ICD-10-CM | POA: Diagnosis not present

## 2024-02-07 DIAGNOSIS — I700 Atherosclerosis of aorta: Secondary | ICD-10-CM | POA: Diagnosis not present

## 2024-02-07 DIAGNOSIS — Z7982 Long term (current) use of aspirin: Secondary | ICD-10-CM

## 2024-02-07 DIAGNOSIS — Z90710 Acquired absence of both cervix and uterus: Secondary | ICD-10-CM

## 2024-02-07 LAB — CBC WITH DIFFERENTIAL/PLATELET
Abs Immature Granulocytes: 0.03 10*3/uL (ref 0.00–0.07)
Basophils Absolute: 0 10*3/uL (ref 0.0–0.1)
Basophils Relative: 0 %
Eosinophils Absolute: 0.6 10*3/uL — ABNORMAL HIGH (ref 0.0–0.5)
Eosinophils Relative: 5 %
HCT: 32.5 % — ABNORMAL LOW (ref 36.0–46.0)
Hemoglobin: 10.2 g/dL — ABNORMAL LOW (ref 12.0–15.0)
Immature Granulocytes: 0 %
Lymphocytes Relative: 25 %
Lymphs Abs: 3 10*3/uL (ref 0.7–4.0)
MCH: 31.6 pg (ref 26.0–34.0)
MCHC: 31.4 g/dL (ref 30.0–36.0)
MCV: 100.6 fL — ABNORMAL HIGH (ref 80.0–100.0)
Monocytes Absolute: 0.6 10*3/uL (ref 0.1–1.0)
Monocytes Relative: 5 %
Neutro Abs: 7.6 10*3/uL (ref 1.7–7.7)
Neutrophils Relative %: 65 %
Platelets: 328 10*3/uL (ref 150–400)
RBC: 3.23 MIL/uL — ABNORMAL LOW (ref 3.87–5.11)
RDW: 12.4 % (ref 11.5–15.5)
WBC: 11.8 10*3/uL — ABNORMAL HIGH (ref 4.0–10.5)
nRBC: 0 % (ref 0.0–0.2)

## 2024-02-07 LAB — BASIC METABOLIC PANEL WITH GFR
Anion gap: 9 (ref 5–15)
BUN: 21 mg/dL (ref 8–23)
CO2: 23 mmol/L (ref 22–32)
Calcium: 8.4 mg/dL — ABNORMAL LOW (ref 8.9–10.3)
Chloride: 101 mmol/L (ref 98–111)
Creatinine, Ser: 1.32 mg/dL — ABNORMAL HIGH (ref 0.44–1.00)
GFR, Estimated: 41 mL/min — ABNORMAL LOW (ref >60)
Glucose, Bld: 92 mg/dL (ref 70–99)
Potassium: 3.7 mmol/L (ref 3.5–5.1)
Sodium: 133 mmol/L — ABNORMAL LOW (ref 135–145)

## 2024-02-07 LAB — RESP PANEL BY RT-PCR (RSV, FLU A&B, COVID)  RVPGX2
Influenza A by PCR: NEGATIVE
Influenza B by PCR: NEGATIVE
Resp Syncytial Virus by PCR: NEGATIVE
SARS Coronavirus 2 by RT PCR: NEGATIVE

## 2024-02-07 LAB — BRAIN NATRIURETIC PEPTIDE: B Natriuretic Peptide: 153 pg/mL — ABNORMAL HIGH (ref 0.0–100.0)

## 2024-02-07 LAB — D-DIMER, QUANTITATIVE: D-Dimer, Quant: 0.96 ug{FEU}/mL — ABNORMAL HIGH (ref 0.00–0.50)

## 2024-02-07 MED ORDER — MOMETASONE FURO-FORMOTEROL FUM 200-5 MCG/ACT IN AERO
2.0000 | INHALATION_SPRAY | Freq: Two times a day (BID) | RESPIRATORY_TRACT | Status: DC
  Administered 2024-02-08 – 2024-02-12 (×9): 2 via RESPIRATORY_TRACT
  Filled 2024-02-07: qty 8.8

## 2024-02-07 MED ORDER — METOPROLOL TARTRATE 25 MG PO TABS: 25.0000 mg | ORAL_TABLET | Freq: Two times a day (BID) | ORAL | Status: DC

## 2024-02-07 MED ORDER — SODIUM CHLORIDE 0.9 % IV SOLN
500.0000 mg | INTRAVENOUS | Status: DC
  Administered 2024-02-08 – 2024-02-10 (×3): 500 mg via INTRAVENOUS
  Filled 2024-02-07 (×3): qty 5

## 2024-02-07 MED ORDER — FLUOXETINE HCL 10 MG PO CAPS: 10.0000 mg | ORAL_CAPSULE | Freq: Every day | ORAL | Status: DC

## 2024-02-07 MED ORDER — IOHEXOL 350 MG/ML SOLN
60.0000 mL | Freq: Once | INTRAVENOUS | Status: AC | PRN
  Administered 2024-02-07: 60 mL via INTRAVENOUS

## 2024-02-07 MED ORDER — SODIUM CHLORIDE 0.9 % IV SOLN: INTRAVENOUS | Status: DC | PRN

## 2024-02-07 MED ORDER — ISOSORBIDE MONONITRATE ER 30 MG PO TB24
30.0000 mg | ORAL_TABLET | Freq: Every day | ORAL | Status: DC
  Administered 2024-02-08 – 2024-02-12 (×5): 30 mg via ORAL
  Filled 2024-02-07 (×5): qty 1

## 2024-02-07 MED ORDER — ASPIRIN 81 MG PO TBEC
81.0000 mg | DELAYED_RELEASE_TABLET | Freq: Every day | ORAL | Status: DC
  Administered 2024-02-08 – 2024-02-12 (×5): 81 mg via ORAL
  Filled 2024-02-07 (×5): qty 1

## 2024-02-07 MED ORDER — SODIUM CHLORIDE 0.9 % IV SOLN
2.0000 g | INTRAVENOUS | Status: DC
  Administered 2024-02-07 – 2024-02-11 (×5): 2 g via INTRAVENOUS
  Filled 2024-02-07 (×5): qty 20

## 2024-02-07 MED ORDER — POLYETHYLENE GLYCOL 3350 17 G PO PACK: 17.0000 g | PACK | Freq: Every day | ORAL | Status: DC | PRN

## 2024-02-07 MED ORDER — ONDANSETRON HCL 4 MG PO TABS: 4.0000 mg | ORAL_TABLET | Freq: Four times a day (QID) | ORAL | Status: DC | PRN

## 2024-02-07 MED ORDER — DILTIAZEM HCL ER COATED BEADS 120 MG PO CP24: 240.0000 mg | ORAL_CAPSULE | Freq: Every day | ORAL | Status: DC

## 2024-02-07 MED ORDER — ALBUTEROL SULFATE (2.5 MG/3ML) 0.083% IN NEBU: 2.5000 mg | INHALATION_SOLUTION | RESPIRATORY_TRACT | Status: DC | PRN

## 2024-02-07 MED ORDER — ONDANSETRON HCL 4 MG/2ML IJ SOLN: 4.0000 mg | Freq: Four times a day (QID) | INTRAMUSCULAR | Status: DC | PRN

## 2024-02-07 MED ORDER — VITAMIN B-12 1000 MCG PO TABS
1000.0000 ug | ORAL_TABLET | Freq: Every day | ORAL | Status: DC
  Administered 2024-02-08 – 2024-02-12 (×5): 1000 ug via ORAL
  Filled 2024-02-07 (×5): qty 1

## 2024-02-07 MED ORDER — TRAZODONE HCL 50 MG PO TABS: 50.0000 mg | ORAL_TABLET | Freq: Every evening | ORAL | Status: DC | PRN

## 2024-02-07 MED ORDER — BISACODYL 10 MG RE SUPP: 10.0000 mg | Freq: Every day | RECTAL | Status: DC | PRN

## 2024-02-07 MED ORDER — SODIUM CHLORIDE 0.9% FLUSH
3.0000 mL | Freq: Two times a day (BID) | INTRAVENOUS | Status: DC
  Administered 2024-02-08 – 2024-02-12 (×10): 3 mL via INTRAVENOUS

## 2024-02-07 MED ORDER — PANTOPRAZOLE SODIUM 40 MG PO TBEC: 40.0000 mg | DELAYED_RELEASE_TABLET | Freq: Every day | ORAL | Status: DC

## 2024-02-07 MED ORDER — EZETIMIBE 10 MG PO TABS
10.0000 mg | ORAL_TABLET | Freq: Every day | ORAL | Status: DC
  Administered 2024-02-08 – 2024-02-12 (×5): 10 mg via ORAL
  Filled 2024-02-07 (×5): qty 1

## 2024-02-07 MED ORDER — IPRATROPIUM-ALBUTEROL 0.5-2.5 (3) MG/3ML IN SOLN
3.0000 mL | Freq: Four times a day (QID) | RESPIRATORY_TRACT | Status: DC
  Administered 2024-02-08: 3 mL via RESPIRATORY_TRACT
  Filled 2024-02-07: qty 3

## 2024-02-07 MED ORDER — ACETAMINOPHEN 650 MG RE SUPP: 650.0000 mg | Freq: Four times a day (QID) | RECTAL | Status: DC | PRN

## 2024-02-07 MED ORDER — ESTRADIOL 0.5 MG PO TABS
0.5000 mg | ORAL_TABLET | Freq: Every day | ORAL | Status: DC
  Administered 2024-02-08 – 2024-02-12 (×5): 0.5 mg via ORAL
  Filled 2024-02-07 (×5): qty 1

## 2024-02-07 MED ORDER — SODIUM CHLORIDE 0.9% FLUSH: 3.0000 mL | INTRAVENOUS | Status: DC | PRN

## 2024-02-07 MED ORDER — DM-GUAIFENESIN ER 30-600 MG PO TB12
1.0000 | ORAL_TABLET | Freq: Two times a day (BID) | ORAL | Status: DC
  Administered 2024-02-08 – 2024-02-12 (×8): 1 via ORAL
  Filled 2024-02-07 (×9): qty 1

## 2024-02-07 MED ORDER — ACETAMINOPHEN 325 MG PO TABS
650.0000 mg | ORAL_TABLET | Freq: Four times a day (QID) | ORAL | Status: DC | PRN
  Administered 2024-02-08 – 2024-02-11 (×5): 650 mg via ORAL
  Filled 2024-02-07 (×5): qty 2

## 2024-02-07 MED ORDER — ALPRAZOLAM 0.5 MG PO TABS: 0.5000 mg | ORAL_TABLET | Freq: Every day | ORAL | Status: DC | PRN

## 2024-02-07 MED ORDER — ROSUVASTATIN CALCIUM 10 MG PO TABS
20.0000 mg | ORAL_TABLET | Freq: Every day | ORAL | Status: DC
  Administered 2024-02-08 – 2024-02-12 (×5): 20 mg via ORAL
  Filled 2024-02-07 (×5): qty 2

## 2024-02-07 MED ORDER — TRAMADOL HCL 50 MG PO TABS: 50.0000 mg | ORAL_TABLET | Freq: Two times a day (BID) | ORAL | Status: DC | PRN

## 2024-02-07 MED ORDER — HEPARIN SODIUM (PORCINE) 5000 UNIT/ML IJ SOLN
5000.0000 [IU] | Freq: Three times a day (TID) | INTRAMUSCULAR | Status: DC
  Administered 2024-02-08 – 2024-02-10 (×7): 5000 [IU] via SUBCUTANEOUS
  Filled 2024-02-07 (×7): qty 1

## 2024-02-07 MED ORDER — GABAPENTIN 100 MG PO CAPS: 100.0000 mg | ORAL_CAPSULE | Freq: Every day | ORAL | Status: DC

## 2024-02-07 MED ORDER — SODIUM CHLORIDE 0.9% FLUSH
3.0000 mL | Freq: Two times a day (BID) | INTRAVENOUS | Status: DC
  Administered 2024-02-08: 3 mL via INTRAVENOUS

## 2024-02-07 MED ORDER — SODIUM CHLORIDE 0.9 % IV SOLN
100.0000 mg | Freq: Once | INTRAVENOUS | Status: AC
  Administered 2024-02-07: 100 mg via INTRAVENOUS
  Filled 2024-02-07: qty 100

## 2024-02-07 MED ORDER — MONTELUKAST SODIUM 10 MG PO TABS
10.0000 mg | ORAL_TABLET | Freq: Every day | ORAL | Status: DC
  Administered 2024-02-08 – 2024-02-11 (×5): 10 mg via ORAL
  Filled 2024-02-07 (×5): qty 1

## 2024-02-07 NOTE — ED Triage Notes (Signed)
 Pt BIBA from PCP. Pt was 80 on RA, and had pneumonia based on CXR. C/o SOB during exertion for 4 days. No other complaints.  Placed on 2L [place]- up to 97%   22 in L hand  AOx4

## 2024-02-07 NOTE — ED Provider Notes (Signed)
 [place] EMERGENCY DEPARTMENT AT [facility] Provider Note   CSN: [id number] Arrival date & time: 02/07/24  1528     History {Add pertinent medical, surgical, social history, OB history to HPI:1} Chief Complaint  Patient presents with   Shortness of Breath    Donna Williamson is a 78 y.o. female.  Patient is an 78 year old female with past medical history of congestive heart failure, mitral valve prolapse, Sjogren's disease, coronary artery disease, and an NSTEMI presenting for complaints of hypoxia.  Patient states she felt short of breath yesterday evening.  This morning she used her home pulse ox that demonstrated an oxygen saturation of 88%.  At PCPs office this morning she was 82% on room air.  She denies any fevers or coughing.  She has a history of congestive heart failure and takes daily Lasix.  She states currently her weight is down from its baseline.  She did have an episode in the last 2 weeks in which she stayed at the hospital for 8 days sitting in a chair while her mother was sick.  She admitted to bilateral lower extremity swelling at that time but states it has now improved.  No prior history of DVT or PE.  No blood thinner use.  The history is provided by the patient. No language interpreter was used.  Shortness of Breath Associated symptoms: no abdominal pain, no chest pain, no cough, no ear pain, no fever, no rash, no sore throat and no vomiting        Home Medications Prior to Admission medications   Medication Sig Start Date End Date Taking? Authorizing Provider  albuterol (PROVENTIL) (2.5 MG/3ML) 0.083% nebulizer solution Take 3 mLs (2.5 mg total) by nebulization every 2 (two) hours as needed for wheezing or shortness of breath. 05/04/16   Kathlen Mody, MD  ALPRAZolam Prudy Feeler) 0.5 MG tablet Take 0.5 mg by mouth daily as needed for anxiety.     [provider]  aspirin EC 81 MG tablet Take 81 mg by mouth daily.    [provider]   budesonide-formoterol (SYMBICORT) 160-4.5 MCG/ACT inhaler Inhale 2 puffs into the lungs 2 (two) times daily.    [provider]  carboxymethylcellulose (REFRESH PLUS) 0.5 % SOLN Place 1 drop into both eyes daily as needed (dry eyes).    [provider]  Cholecalciferol (VITAMIN D3) 2000 UNITS TABS Take 2,000 Units by mouth daily.    [provider]  clobetasol ointment (TEMOVATE) 0.05 % Apply 1 Application topically as needed (for itching). 01/28/22   [provider]  cyanocobalamin 500 MCG tablet Take 1,000 mcg by mouth daily.    [provider]  diltiazem (CARDIZEM CD) 240 MG 24 hr capsule Take 1 capsule (240 mg total) by mouth daily. 03/08/21   Filbert Schilder, NP  EPIPEN 2-PAK 0.3 MG/0.3ML SOAJ injection Inject 0.3 mg into the skin daily as needed (allergic reaction).  11/01/13   [provider]  estradiol (ESTRACE) 0.5 MG tablet Take 0.5 mg by mouth daily.    [provider]  ezetimibe (ZETIA) 10 MG tablet Take 10 mg by mouth daily.    [provider]  FLUoxetine (PROZAC) 10 MG capsule Take 10 mg by mouth daily. 02/21/22   [provider]  fluticasone (FLONASE) 50 MCG/ACT nasal spray Place 2 sprays into the nose daily.    [provider]  furosemide (LASIX) 20 MG tablet Take 1 tablet (20 mg total) by mouth daily. 01/15/24  Tonny Bollman, MD  gabapentin (NEURONTIN) 100 MG capsule Take 100 mg by mouth at bedtime.     [provider]  inclisiran (LEQVIO) 284 MG/1.5ML SOSY injection Inject 284 mg into the skin once.    [provider]  isosorbide mononitrate (IMDUR) 30 MG 24 hr tablet TAKE 1 TABLET(30 MG) BY MOUTH DAILY 02/13/23   Tereso Newcomer T, PA-C  methylPREDNISolone (MEDROL) 4 MG tablet Medrol dose pack. Take as instructed 09/27/23   Kathryne Hitch, MD  metoprolol tartrate (LOPRESSOR) 25 MG tablet TAKE 1/2 TABLET(12.5 MG) BY MOUTH TWICE DAILY 03/15/23   Tonny Bollman, MD   montelukast (SINGULAIR) 10 MG tablet Take 10 mg by mouth at bedtime.    [provider]  Multiple Vitamin (MULTIVITAMIN WITH MINERALS) TABS tablet Take 1 tablet by mouth daily.    [provider]  nitroGLYCERIN (NITROSTAT) 0.4 MG SL tablet Place 1 tablet (0.4 mg total) under the tongue every 5 (five) minutes as needed for chest pain (up to 3 doses. If taking 3rd dose call 911). 02/04/22   Tereso Newcomer T, PA-C  pantoprazole (PROTONIX) 40 MG tablet TAKE 2 TABLETS(80 MG) BY MOUTH DAILY 04/27/23   Tonny Bollman, MD  potassium chloride (KLOR-CON) 10 MEQ tablet Take 10 mEq by mouth 2 (two) times a week. Take 1 tablet 2 times a week 05/09/22   [provider]  PROAIR HFA 108 (90 BASE) MCG/ACT inhaler Inhale 1-2 puffs into the lungs every 4 (four) hours as needed for wheezing. 11/01/13   [provider]  rosuvastatin (CRESTOR) 20 MG tablet Take 1 tablet (20 mg total) by mouth daily at 6 PM. 05/30/16   Bhagat, Bhavinkumar, PA  traMADol (ULTRAM) 50 MG tablet Take 1-2 tablets (50-100 mg total) by mouth 3 (three) times daily as needed. 09/27/23   Kathryne Hitch, MD      Allergies    Levofloxacin, Plavix [clopidogrel bisulfate], Ciprofibrate, Codeine, Drug ingredient [ticagrelor], Nitrofurantoin, Ciprofloxacin, Penicillins, and Sulfa antibiotics    Review of Systems   Review of Systems  Constitutional:  Negative for chills and fever.  HENT:  Negative for ear pain and sore throat.   Eyes:  Negative for pain and visual disturbance.  Respiratory:  Positive for shortness of breath. Negative for cough.   Cardiovascular:  Negative for chest pain and palpitations.  Gastrointestinal:  Negative for abdominal pain and vomiting.  Genitourinary:  Negative for dysuria and hematuria.  Musculoskeletal:  Negative for arthralgias and back pain.  Skin:  Negative for color change and rash.  Neurological:  Negative for seizures and syncope.  All other systems reviewed and are  negative.   Physical Exam Updated Vital Signs BP 134/68   Pulse 85   Temp 98.9 F (37.2 C) (Oral)   Resp (!) 22   Ht 5\' 5"  (1.651 m)   Wt 95.3 kg   SpO2 99%   BMI 34.95 kg/m  Physical Exam Vitals and nursing note reviewed.  Constitutional:      General: She is not in acute distress.    Appearance: She is well-developed.  HENT:     Head: Normocephalic and atraumatic.  Eyes:     Conjunctiva/sclera: Conjunctivae normal.  Cardiovascular:     Rate and Rhythm: Normal rate and regular rhythm.     Heart sounds: No murmur heard. Pulmonary:     Effort: Tachypnea and respiratory distress present.     Comments: Crackles in bilateral lower lung fields Abdominal:     Palpations: Abdomen is  soft.     Tenderness: There is no abdominal tenderness.  Musculoskeletal:        General: No swelling.     Cervical back: Neck supple.     Right lower leg: 1+ Pitting Edema present.     Left lower leg: 1+ Pitting Edema present.  Skin:    General: Skin is warm and dry.     Capillary Refill: Capillary refill takes less than 2 seconds.  Neurological:     Mental Status: She is alert.  Psychiatric:        Mood and Affect: Mood normal.     ED Results / Procedures / Treatments   Labs (all labs ordered are listed, but only abnormal results are displayed) Labs Reviewed  RESP PANEL BY RT-PCR (RSV, FLU A&B, COVID)  [id number]  CBC WITH DIFFERENTIAL/PLATELET  BASIC METABOLIC PANEL WITH GFR  BRAIN NATRIURETIC PEPTIDE  D-DIMER, QUANTITATIVE    EKG EKG Interpretation Date/Time:  Wednesday February 07 2024 15:51:24 EDT Ventricular Rate:  81 PR Interval:  184 QRS Duration:  104 QT Interval:  385 QTC Calculation: 447 R Axis:   20  Text Interpretation: Sinus rhythm LVH with secondary repolarization abnormality Confirmed by Edwin Dada ([id number]) on 02/07/2024 4:55:34 PM  Radiology No results found.  Procedures .Critical Care  Performed by: Franne Forts, DO Authorized by: Franne Forts, DO    Critical care provider statement:    Critical care time (minutes):  101   Critical care was necessary to treat or prevent imminent or life-threatening deterioration of the following conditions:  Respiratory failure   Critical care was time spent personally by me on the following activities:  Development of treatment plan with patient or surrogate, discussions with consultants, evaluation of patient's response to treatment, examination of patient, ordering and review of laboratory studies, ordering and review of radiographic studies, ordering and performing treatments and interventions, pulse oximetry, re-evaluation of patient's condition and review of old charts   {Document cardiac monitor, telemetry assessment procedure when appropriate:1}  Medications Ordered in ED Medications - No data to display  ED Course/ Medical Decision Making/ A&P   {   Click here for ABCD2, HEART and other calculatorsREFRESH Note before signing :1}                              Medical Decision Making Amount and/or Complexity of Data Reviewed Labs: ordered. Radiology: ordered.  Risk Prescription drug management. Decision regarding hospitalization.   25:40 PM  78 year old female with past medical history of congestive heart failure, mitral valve prolapse, Sjogren's disease, coronary artery disease, and an NSTEMI presenting for complaints of hypoxia.  Patient is alert and oriented x 3, respiratory distress with tachypnea and hypoxia on arrival.  2 L nasal cannula placed and patient at 97% while resting.  Crackles in bilateral lower lung fields.  Swollen bilateral lower extremity pitting edema.  Chart review demonstrates an echocardiogram on 02/28/2023 demonstrating normal EF of 55 to 60%.  Mild mitral regurgitation.  Calcified aortic valve.  Frontal diagnosis includes but is not limited to: Congestive heart failure exacerbation, pulmonary embolism, ACS, pneumonia, etc.  X-ray concerning for a possible right  upper lobe pneumonia.  And bilateral small pleural effusions.  Laboratory studies demonstrate mild leukocytosis. No sepsis. Cxr concerning for pneumonia.  This time patient avidly denies fevers or coughing.  D-dimer elevated.  Will order CT PE study to rule out pulmonary embolism.  BG stable.  Troponin stable.  Electrolytes stable.  CT demonstrates no pulmonary embolism.  Is a different pneumonia. 1. Right upper lobe opacity typical of pneumonia. 2. Suspected patchy left lung base opacity, also suspicious for pneumonia.  Recommending admission for further treatment of community-acquired pneumonia with hypoxia.  {Document critical care time when appropriate:1} {Document review of labs and clinical decision tools ie heart score, Chads2Vasc2 etc:1}  {Document your independent review of radiology images, and any outside records:1} {Document your discussion with family members, caretakers, and with consultants:1} {Document social determinants of health affecting pt's care:1} {Document your decision making why or why not admission, treatments were needed:1} Final Clinical Impression(s) / ED Diagnoses Final diagnoses:  Acute respiratory failure with hypoxia (HCC)    Rx / DC Orders ED Discharge Orders     None

## 2024-02-07 NOTE — H&P (Signed)
 History and Physical    Patient: Donna Williamson ZOX:[id number] DOB: 1946/05/09 DOA: 02/07/2024 DOS: the patient was seen and examined on 02/07/2024 PCP: Rodrigo Ran, MD  Patient coming from: Home  Chief Complaint:  Chief Complaint  Patient presents with   Shortness of Breath   HPI: Donna Williamson is a 78 y.o. female with medical history significant of ***  Review of Systems: {ROS_Text:26778} Past Medical History:  Diagnosis Date   Anemia    Anginal pain (HCC)    Anxiety    Arthritis    "feet, knees, back" (05/02/2016)   Asthma    CAD (coronary artery disease)    a. 04/2016: NSTEMI 95% stenosis 1st Mrg (s/p DES)   Chest pain    a. 10/2013 Exercise Myoview: Ef 65%, no ischemia.   CHF (congestive heart failure) (HCC)    Chronic bronchitis (HCC)    Chronic lower back pain    COPD (chronic obstructive pulmonary disease) (HCC)    GERD (gastroesophageal reflux disease)    History of blood transfusion 1960s   "related to my hysterectomy"   Hypercholesterolemia    Hypertension    Lower extremity edema    Mild aortic stenosis 03/07/2023   TTE 02/28/2023: EF 55-60, no RWMA, mild asymmetric LVH, NL RVSF, mild MR, mild aortic stenosis (bulky subvalvular calcification), mean AV gradient 9.6, V-max 199 cm/s, RAP 3   Mitral valve prolapse    NSTEMI (non-ST elevated myocardial infarction) (HCC) 05/01/2016   Pneumonia 05/01/2016   "saw trace of slight pneumonia/CT scan" (05/02/2016)   Seasonal allergies    Sjogren's syndrome (HCC)    Wheezing    Past Surgical History:  Procedure Laterality Date   ABDOMINAL HYSTERECTOMY  1960s   "partial"   CARDIAC CATHETERIZATION N/A 05/02/2016   Procedure: Right/Left Heart Cath and Coronary Angiography;  Surgeon: Corky Crafts, MD;  Location: [facility] INVASIVE CV LAB;  Service: Cardiovascular;  Laterality: N/A;   CARDIAC CATHETERIZATION N/A 05/02/2016   Procedure: Coronary Stent Intervention;  Surgeon: Corky Crafts, MD;  Location: [facility] INVASIVE CV  LAB;  Service: Cardiovascular;  Laterality: N/A;   CATARACT EXTRACTION W/PHACO Right 09/20/2019   Procedure: CATARACT EXTRACTION PHACO AND INTRAOCULAR LENS PLACEMENT (IOC) RIGHT;  Surgeon: Galen Manila, MD;  Location: [facility] ORS;  Service: Ophthalmology;  Laterality: Right;  [place] 01:29.4 CDE 19.28 FLUID PACK LOT # [id number]   CATARACT EXTRACTION W/PHACO Left 10/15/2019   Procedure: CATARACT EXTRACTION PHACO AND INTRAOCULAR LENS PLACEMENT (IOC) LEFT 8.76, 00:48.8;  Surgeon: Galen Manila, MD;  Location: [facility];  Service: Ophthalmology;  Laterality: Left;   COLONOSCOPY W/ BIOPSIES AND POLYPECTOMY     CORONARY ANGIOPLASTY WITH STENT PLACEMENT  05/02/2016   "1 stent"   CORONARY PRESSURE/FFR STUDY N/A 02/17/2021   Procedure: INTRAVASCULAR PRESSURE WIRE/FFR STUDY;  Surgeon: Kathleene Hazel, MD;  Location: [facility] INVASIVE CV LAB;  Service: Cardiovascular;  Laterality: N/A;   CORONARY STENT INTERVENTION N/A 02/17/2021   Procedure: CORONARY STENT INTERVENTION;  Surgeon: Kathleene Hazel, MD;  Location: [facility] INVASIVE CV LAB;  Service: Cardiovascular;  Laterality: N/A;   ESOPHAGOGASTRODUODENOSCOPY     JOINT REPLACEMENT Left 2007   hip   LAPAROSCOPIC SALPINGOOPHERECTOMY Bilateral ~ 1990   LEFT HEART CATH AND CORONARY ANGIOGRAPHY N/A 02/17/2021   Procedure: LEFT HEART CATH AND CORONARY ANGIOGRAPHY;  Surgeon: Kathleene Hazel, MD;  Location: [facility] INVASIVE CV LAB;  Service: Cardiovascular;  Laterality: N/A;   TOTAL HIP ARTHROPLASTY Left 2007   Social History:  reports that  she has never smoked. She has never used smokeless tobacco. She reports that she does not drink alcohol and does not use drugs.  Allergies  Allergen Reactions   Levofloxacin Anaphylaxis   Plavix [Clopidogrel Bisulfate] Rash   Ciprofibrate     Other reaction(s): swelling, muscle aches   Codeine Nausea Only   Drug Ingredient [Ticagrelor] Other (See Comments)    Patient reports hemorrhage after medical trial  of brilinta in 2017   Nitrofurantoin Other (See Comments)    unknown VISION CHANGES   Ciprofloxacin Rash    Other Reaction(s): nausea and vomiting   Penicillins Rash    Has patient had a PCN reaction causing immediate rash, facial/tongue/throat swelling, SOB or lightheadedness with hypotension: No Has patient had a PCN reaction causing severe rash involving mucus membranes or skin necrosis: No Has patient had a PCN reaction that required hospitalization: No Has patient had a PCN reaction occurring within the last 10 years: No If all of the above answers are "NO", then may proceed with Cephalosporin use.   Sulfa Antibiotics Rash    Family History  Problem Relation Age of Onset   Hypertension Mother    Stroke Mother    Breast cancer Mother    Heart disease Father    Emphysema Father        smoked   Asthma Father    Ovarian cancer Sister     Prior to Admission medications   Medication Sig Start Date End Date Taking? Authorizing Provider  albuterol (PROVENTIL) (2.5 MG/3ML) 0.083% nebulizer solution Take 3 mLs (2.5 mg total) by nebulization every 2 (two) hours as needed for wheezing or shortness of breath. 05/04/16   Kathlen Mody, MD  ALPRAZolam Prudy Feeler) 0.5 MG tablet Take 0.5 mg by mouth daily as needed for anxiety.     [provider]  aspirin EC 81 MG tablet Take 81 mg by mouth daily.    [provider]  budesonide-formoterol (SYMBICORT) 160-4.5 MCG/ACT inhaler Inhale 2 puffs into the lungs 2 (two) times daily.    [provider]  carboxymethylcellulose (REFRESH PLUS) 0.5 % SOLN Place 1 drop into both eyes daily as needed (dry eyes).    [provider]  Cholecalciferol (VITAMIN D3) 2000 UNITS TABS Take 2,000 Units by mouth daily.    [provider]  clobetasol ointment (TEMOVATE) 0.05 % Apply 1 Application topically as needed (for itching). 01/28/22   [provider]  cyanocobalamin 500 MCG tablet Take 1,000 mcg by mouth daily.     [provider]  diltiazem (CARDIZEM CD) 240 MG 24 hr capsule Take 1 capsule (240 mg total) by mouth daily. 03/08/21   Filbert Schilder, NP  EPIPEN 2-PAK 0.3 MG/0.3ML SOAJ injection Inject 0.3 mg into the skin daily as needed (allergic reaction).  11/01/13   [provider]  estradiol (ESTRACE) 0.5 MG tablet Take 0.5 mg by mouth daily.    [provider]  ezetimibe (ZETIA) 10 MG tablet Take 10 mg by mouth daily.    [provider]  FLUoxetine (PROZAC) 10 MG capsule Take 10 mg by mouth daily. 02/21/22   [provider]  fluticasone (FLONASE) 50 MCG/ACT nasal spray Place 2 sprays into the nose daily.    [provider]  furosemide (LASIX) 20 MG tablet Take 1 tablet (20 mg total) by mouth daily. 01/15/24   Tonny Bollman, MD  gabapentin (NEURONTIN) 100 MG capsule Take 100 mg by mouth at bedtime.     [provider]  inclisiran (LEQVIO) 284 MG/1.5ML SOSY injection Inject 284 mg into the skin once.    [provider]  isosorbide mononitrate (IMDUR) 30 MG 24 hr tablet TAKE 1 TABLET(30 MG) BY MOUTH DAILY 02/13/23   Tereso Newcomer T, PA-C  methylPREDNISolone (MEDROL) 4 MG tablet Medrol dose pack. Take as instructed 09/27/23   Kathryne Hitch, MD  metoprolol tartrate (LOPRESSOR) 25 MG tablet TAKE 1/2 TABLET(12.5 MG) BY MOUTH TWICE DAILY 03/15/23   Tonny Bollman, MD  montelukast (SINGULAIR) 10 MG tablet Take 10 mg by mouth at bedtime.    [provider]  Multiple Vitamin (MULTIVITAMIN WITH MINERALS) TABS tablet Take 1 tablet by mouth daily.    [provider]  nitroGLYCERIN (NITROSTAT) 0.4 MG SL tablet Place 1 tablet (0.4 mg total) under the tongue every 5 (five) minutes as needed for chest pain (up to 3 doses. If taking 3rd dose call 911). 02/04/22   Tereso Newcomer T, PA-C  pantoprazole (PROTONIX) 40 MG tablet TAKE 2 TABLETS(80 MG) BY MOUTH DAILY 04/27/23   Tonny Bollman, MD  potassium chloride (KLOR-CON) 10 MEQ tablet  Take 10 mEq by mouth 2 (two) times a week. Take 1 tablet 2 times a week 05/09/22   [provider]  PROAIR HFA 108 (90 BASE) MCG/ACT inhaler Inhale 1-2 puffs into the lungs every 4 (four) hours as needed for wheezing. 11/01/13   [provider]  rosuvastatin (CRESTOR) 20 MG tablet Take 1 tablet (20 mg total) by mouth daily at 6 PM. 05/30/16   Bhagat, Bhavinkumar, PA  traMADol (ULTRAM) 50 MG tablet Take 1-2 tablets (50-100 mg total) by mouth 3 (three) times daily as needed. 09/27/23   Kathryne Hitch, MD    Physical Exam: Vitals:   02/07/24 1549 02/07/24 1602 02/07/24 1859 02/07/24 2145  BP: 134/68 (!) 134/58 139/87 (!) 135/55  Pulse: 85 81 87 78  Resp: (!) 22 (!) 30 17 17   Temp: 98.9 F (37.2 C)  98.8 F (37.1 C) 98.8 F (37.1 C)  TempSrc: Oral  Oral   SpO2: 99% 97% 97% 98%  Weight:      Height:       *** Data Reviewed: {Tip this will not be part of the note when signed- Document your independent interpretation of telemetry tracing, EKG, lab, Radiology test or any other diagnostic tests. Add any new diagnostic test ordered today. (Optional):26781} {Results:26384}  Assessment and Plan: No notes have been filed under this hospital service. Service: Hospitalist     Advance Care Planning:   Code Status: Full Code ***  Consults: ***  Family Communication: ***  Severity of Illness: {Observation/Inpatient:21159}  Author: Shon Hale, MD 02/07/2024 10:47 PM  For on call review [email or web address].

## 2024-02-07 NOTE — ED Notes (Signed)
 Patient transported to CT

## 2024-02-08 ENCOUNTER — Ambulatory Visit: Admitting: Physician Assistant

## 2024-02-08 ENCOUNTER — Other Ambulatory Visit: Payer: Self-pay | Admitting: Physician Assistant

## 2024-02-08 ENCOUNTER — Encounter (HOSPITAL_COMMUNITY): Payer: Self-pay | Admitting: Family Medicine

## 2024-02-08 DIAGNOSIS — J189 Pneumonia, unspecified organism: Secondary | ICD-10-CM | POA: Diagnosis not present

## 2024-02-08 LAB — CBC
HCT: 31.2 % — ABNORMAL LOW (ref 36.0–46.0)
Hemoglobin: 10 g/dL — ABNORMAL LOW (ref 12.0–15.0)
MCH: 32.5 pg (ref 26.0–34.0)
MCHC: 32.1 g/dL (ref 30.0–36.0)
MCV: 101.3 fL — ABNORMAL HIGH (ref 80.0–100.0)
Platelets: 311 10*3/uL (ref 150–400)
RBC: 3.08 MIL/uL — ABNORMAL LOW (ref 3.87–5.11)
RDW: 12.5 % (ref 11.5–15.5)
WBC: 10.2 10*3/uL (ref 4.0–10.5)
nRBC: 0 % (ref 0.0–0.2)

## 2024-02-08 LAB — BASIC METABOLIC PANEL WITH GFR
Anion gap: 8 (ref 5–15)
BUN: 17 mg/dL (ref 8–23)
CO2: 24 mmol/L (ref 22–32)
Calcium: 8.5 mg/dL — ABNORMAL LOW (ref 8.9–10.3)
Chloride: 104 mmol/L (ref 98–111)
Creatinine, Ser: 1.2 mg/dL — ABNORMAL HIGH (ref 0.44–1.00)
GFR, Estimated: 46 mL/min — ABNORMAL LOW (ref >60)
Glucose, Bld: 104 mg/dL — ABNORMAL HIGH (ref 70–99)
Potassium: 3.4 mmol/L — ABNORMAL LOW (ref 3.5–5.1)
Sodium: 136 mmol/L (ref 135–145)

## 2024-02-08 LAB — PROCALCITONIN: Procalcitonin: 0.1 ng/mL

## 2024-02-08 MED ORDER — SENNOSIDES-DOCUSATE SODIUM 8.6-50 MG PO TABS: 1.0000 | ORAL_TABLET | Freq: Every evening | ORAL | Status: DC | PRN

## 2024-02-08 MED ORDER — POTASSIUM CHLORIDE CRYS ER 20 MEQ PO TBCR
40.0000 meq | EXTENDED_RELEASE_TABLET | Freq: Once | ORAL | Status: AC
  Administered 2024-02-08: 40 meq via ORAL
  Filled 2024-02-08: qty 2

## 2024-02-08 MED ORDER — METOPROLOL TARTRATE 5 MG/5ML IV SOLN: 5.0000 mg | INTRAVENOUS | Status: DC | PRN

## 2024-02-08 MED ORDER — DILTIAZEM HCL ER COATED BEADS 120 MG PO CP24
120.0000 mg | ORAL_CAPSULE | Freq: Every day | ORAL | Status: DC
  Administered 2024-02-08 – 2024-02-12 (×5): 120 mg via ORAL
  Filled 2024-02-08 (×5): qty 1

## 2024-02-08 MED ORDER — METOPROLOL TARTRATE 12.5 MG HALF TABLET
12.5000 mg | ORAL_TABLET | Freq: Two times a day (BID) | ORAL | Status: DC
  Administered 2024-02-08 – 2024-02-12 (×9): 12.5 mg via ORAL
  Filled 2024-02-08 (×9): qty 1

## 2024-02-08 MED ORDER — FLUOXETINE HCL 20 MG PO CAPS
20.0000 mg | ORAL_CAPSULE | Freq: Every day | ORAL | Status: DC
  Administered 2024-02-08 – 2024-02-12 (×5): 20 mg via ORAL
  Filled 2024-02-08 (×5): qty 1

## 2024-02-08 MED ORDER — IPRATROPIUM-ALBUTEROL 0.5-2.5 (3) MG/3ML IN SOLN: 3.0000 mL | RESPIRATORY_TRACT | Status: DC | PRN

## 2024-02-08 MED ORDER — METOPROLOL TARTRATE 12.5 MG HALF TABLET: 12.5000 mg | ORAL_TABLET | Freq: Two times a day (BID) | ORAL | Status: DC

## 2024-02-08 MED ORDER — EZETIMIBE 10 MG PO TABS: 10.0000 mg | ORAL_TABLET | Freq: Every day | ORAL | Status: DC

## 2024-02-08 MED ORDER — PANTOPRAZOLE SODIUM 40 MG PO TBEC
80.0000 mg | DELAYED_RELEASE_TABLET | Freq: Every day | ORAL | Status: DC
  Administered 2024-02-08 – 2024-02-12 (×5): 80 mg via ORAL
  Filled 2024-02-08 (×5): qty 2

## 2024-02-08 MED ORDER — GABAPENTIN 300 MG PO CAPS
300.0000 mg | ORAL_CAPSULE | Freq: Every day | ORAL | Status: DC
  Administered 2024-02-08 – 2024-02-11 (×4): 300 mg via ORAL
  Filled 2024-02-08 (×4): qty 1

## 2024-02-08 MED ORDER — IPRATROPIUM-ALBUTEROL 0.5-2.5 (3) MG/3ML IN SOLN
3.0000 mL | Freq: Three times a day (TID) | RESPIRATORY_TRACT | Status: DC
  Administered 2024-02-08 – 2024-02-11 (×9): 3 mL via RESPIRATORY_TRACT
  Filled 2024-02-08 (×9): qty 3

## 2024-02-08 MED ORDER — ROSUVASTATIN CALCIUM 10 MG PO TABS
20.0000 mg | ORAL_TABLET | ORAL | Status: AC
  Administered 2024-02-08: 20 mg via ORAL
  Filled 2024-02-08: qty 2

## 2024-02-08 MED ORDER — HYDRALAZINE HCL 20 MG/ML IJ SOLN: 10.0000 mg | INTRAMUSCULAR | Status: DC | PRN

## 2024-02-08 NOTE — Progress Notes (Signed)
 PT Cancellation Note  Patient Details Name: Donna Williamson MRN: [id number] DOB: October 22, 1946   Cancelled Treatment:    Reason Eval/Treat Not Completed: Fatigue/lethargy limiting ability to participate  Recently ambulated with Rn, saturation  dropped. Patient resting . Will check back. Blanchard Kelch PT Acute Rehabilitation Services Office [phone]   Jeanine, [place]aven 02/08/2024, 3:47 PM

## 2024-02-08 NOTE — Progress Notes (Signed)
 SATURATION QUALIFICATIONS: (This note is used to comply with regulatory documentation for home oxygen)  Patient Saturations on Room Air at Rest = 88%  Patient Saturations on Room Air while Ambulating = 83%  Patient Saturations on 4 Liters of oxygen while Ambulating = 88%  Please briefly explain why patient needs home oxygen:  Patient was unable to maintain a safe oxygen saturation on RA while ambulating. She required 4 LPM to maintain her sats at 88%. She denied any SOB, light-headedness, or dizziness while ambulating. Provider notified via [employer].

## 2024-02-08 NOTE — Hospital Course (Addendum)
 Brief Narrative:   78 year old with history of CAD prior angioplasty/PCI, COPD, GERD, HTN, Sjogren, HLD comes to the hospital with worsening hypoxia and shortness of breath.  CTA chest shows groundglass opacities and asymmetric right breast tissue/round mass.  She is admitted with concerns of bilateral community-acquired pneumonia.  Assessment & Plan:  Principal Problem:   Pneumonia, organism unspecified(486) Active Problems:   Essential hypertension   GERD (gastroesophageal reflux disease)   Stage 3a chronic kidney disease (HCC)   Acute respiratory distress secondary to bilateral community-acquired pneumonia - Will check procalcitonin, BNP-153 - Bronchodilators, mucolytics, I-S/flutter valve - Empiric azithromycin and Rocephin - COVID, flu, RSV negative  Abnormal right breast tissue/mass - Should get mammogram outpatient with PCP.  Patient aware and she will follow this up outpatient with her PCP  CAD status post PCI - Currently chest pain-free.  On aspirin, statin, Zetia, Cardizem, Imdur  GERD - Protonix  History of Sjogren's - Supportive care  Essential hypertension - Continue Imdur, Cardizem.  IV as needed  Peripheral neuropathy - Gabapentin   DVT prophylaxis: SQ heparin    Code Status: Full Code Family Communication:   Status is: Inpatient On goin management for PNA    Subjective:  Tells me her breathing is better after getting bronchodilators this morning.  Examination:  General exam: Appears calm and comfortable  Respiratory system: Minimal lateral rhonchi. Cardiovascular system: S1 & S2 heard, RRR. No JVD, murmurs, rubs, gallops or clicks. No pedal edema. Gastrointestinal system: Abdomen is nondistended, soft and nontender. No organomegaly or masses felt. Normal bowel sounds heard. Central nervous system: Alert and oriented. No focal neurological deficits. Extremities: Symmetric 5 x 5 power. Skin: No rashes, lesions or ulcers Psychiatry: Judgement and  insight appear normal. Mood & affect appropriate.

## 2024-02-08 NOTE — TOC Initial Note (Signed)
 Transition of Care [facility]) - Initial/Assessment Note    Patient Details  Name: Donna Williamson MRN: [id number] Date of Birth: 05-Nov-1945  Transition of Care [facility]) CM/SW Contact:    Diona Browner, LCSW Phone Number: 02/08/2024, 9:12 AM  Clinical Narrative:                 Pt from home with spouse. Pt continues medical workup. PT/OT eval pending. TOC following for d/c needs.    Barriers to Discharge: Continued Medical Work up   Patient Goals and CMS Choice Patient states their goals for this hospitalization and ongoing recovery are:: return home CMS Medicare.gov Compare Post Acute Care list provided to::  (NA) Choice offered to / list presented to : NA [place] ownership interest in [facility].provided to::  (NA)    Expected Discharge Plan and Services In-house Referral: NA     Living arrangements for the past 2 months: Single Family Home                 DME Arranged: N/A DME Agency: NA       HH Arranged: NA HH Agency: NA        Prior Living Arrangements/Services Living arrangements for the past 2 months: Single Family Home Lives with:: Spouse Patient language and need for interpreter reviewed:: Yes Do you feel safe going back to the place where you live?: Yes      Need for Family Participation in Patient Care: Yes (Comment) Care giver support system in place?: Yes (comment)   Criminal Activity/Legal Involvement Pertinent to Current Situation/Hospitalization: No - Comment as needed  Activities of Daily Living   ADL Screening (condition at time of admission) Independently performs ADLs?: Yes (appropriate for developmental age) (uses cane at home) Is the patient deaf or have difficulty hearing?: No Does the patient have difficulty seeing, even when wearing glasses/contacts?: No Does the patient have difficulty concentrating, remembering, or making decisions?: No  Permission Sought/Granted                  Emotional Assessment Appearance::  Appears stated age Attitude/Demeanor/Rapport: Engaged Affect (typically observed): Accepting Orientation: : Oriented to Self, Oriented to Place, Oriented to  Time, Oriented to Situation Alcohol / Substance Use: Not Applicable Psych Involvement: No (comment)  Admission diagnosis:  Pneumonia, organism unspecified(486) [J18.9] Acute respiratory failure with hypoxia (HCC) [J96.01] Community acquired pneumonia, unspecified laterality [J18.9] Patient Active Problem List   Diagnosis Date Noted   Pneumonia, organism unspecified(486) 02/07/2024   Pulmonary fibrosis (HCC) 06/09/2023   Mild aortic stenosis 03/07/2023   OSA (obstructive sleep apnea) 02/04/2022   Stage 3a chronic kidney disease (HCC) 02/18/2021   Nocturnal oxygen desaturation 02/18/2021   PVC's (premature ventricular contractions) 02/18/2021   Asthma 07/24/2020   Coronary artery disease involving native coronary artery with angina pectoris (HCC) 05/09/2016   NSTEMI (non-ST elevated myocardial infarction) (HCC) 05/02/2016   Chronic heart failure with preserved ejection fraction (HCC)    Hyperglycemia 05/01/2016   Mild anemia 05/01/2016   Anxiety 05/01/2016   Depression 05/01/2016   Primary osteoarthritis of left hip 10/23/2015   Unilateral primary osteoarthritis, left knee 10/23/2015   Porokeratosis 09/21/2015   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   Dry eye syndrome of bilateral lacrimal glands 12/17/2014   Nuclear sclerosis of both eyes 12/17/2014   Sjogren's syndrome (HCC) 12/17/2014   Right knee pain 03/06/2014   Chest pain 11/29/2013   Essential hypertension 11/29/2013   Hyperlipidemia LDL goal <70 11/29/2013  GERD (gastroesophageal reflux disease) 11/29/2013   Lumbar pain 05/16/2013   PCP:  Rodrigo Ran, MD Pharmacy:   [facility] [phone] - [place], [place] - [address] AT [facility] [address] & [facility] [address] [place] [address] Phone: [phone] Fax:  [phone]     Social Drivers of Health (SDOH) Social History: SDOH Screenings   Food Insecurity: No Food Insecurity (02/08/2024)  Housing: Low Risk  (02/08/2024)  Transportation Needs: No Transportation Needs (02/08/2024)  Utilities: Not At Risk (02/08/2024)  Social Connections: Moderately Integrated (02/08/2024)  Tobacco Use: Low Risk  (02/08/2024)   SDOH Interventions:     Readmission Risk Interventions    02/08/2024    9:10 AM  Readmission Risk Prevention Plan  Transportation Screening Complete  PCP or Specialist Appt within 5-7 Days Complete  Home Care Screening Complete  Medication Review (RN CM) Complete

## 2024-02-08 NOTE — Plan of Care (Addendum)
 VSS. Patient remains on 2L [place]. No c/o pain. LBM 4/9. No acute events overnight.  Problem: Education: Goal: Knowledge of General Education information will improve Description: Including pain rating scale, medication(s)/side effects and non-pharmacologic comfort measures Outcome: Progressing   Problem: Clinical Measurements: Goal: Ability to maintain clinical measurements within normal limits will improve Outcome: Progressing Goal: Will remain free from infection Outcome: Progressing   Problem: Safety: Goal: Ability to remain free from injury will improve Outcome: Progressing   Problem: Activity: Goal: Ability to tolerate increased activity will improve Outcome: Progressing   Problem: Clinical Measurements: Goal: Ability to maintain a body temperature in the normal range will improve Outcome: Progressing   Problem: Respiratory: Goal: Ability to maintain adequate ventilation will improve Outcome: Progressing Goal: Ability to maintain a clear airway will improve Outcome: Progressing

## 2024-02-08 NOTE — Progress Notes (Signed)
   02/08/24 2056  BiPAP/CPAP/SIPAP  BiPAP/CPAP/SIPAP Pt Type Adult  Reason BIPAP/CPAP not in use Non-compliant (Patient refused CPAP qhs.  Patient states that she does not wear one at home and does not want to wear one here.)  BiPAP/CPAP /SiPAP Vitals  Bilateral Breath Sounds Clear;Diminished

## 2024-02-08 NOTE — Progress Notes (Signed)
 PROGRESS NOTE    Donna Williamson  HKV:[id number] DOB: [birth year] DOA: 02/07/2024 PCP: Rodrigo Ran, MD    Brief Narrative:   78 year old with history of CAD prior angioplasty/PCI, COPD, GERD, HTN, Sjogren, HLD comes to the hospital with worsening hypoxia and shortness of breath.  CTA chest shows groundglass opacities and asymmetric right breast tissue/round mass.  She is admitted with concerns of bilateral community-acquired pneumonia.  Assessment & Plan:  Principal Problem:   Pneumonia, organism unspecified(486) Active Problems:   Essential hypertension   GERD (gastroesophageal reflux disease)   Stage 3a chronic kidney disease (HCC)   Acute respiratory distress secondary to bilateral community-acquired pneumonia - Will check procalcitonin, BNP-153 - Bronchodilators, mucolytics, I-S/flutter valve - Empiric azithromycin and Rocephin - COVID, flu, RSV negative  Abnormal right breast tissue/mass - Should get mammogram outpatient with PCP.  Patient aware and she will follow this up outpatient with her PCP  CAD status post PCI - Currently chest pain-free.  On aspirin, statin, Zetia, Cardizem, Imdur  GERD - Protonix  History of Sjogren's - Supportive care  Essential hypertension - Continue Imdur, Cardizem.  IV as needed  Peripheral neuropathy - Gabapentin   DVT prophylaxis: SQ heparin    Code Status: Full Code Family Communication:   Status is: Inpatient On goin management for PNA    Subjective:  Tells me her breathing is better after getting bronchodilators this morning.  Examination:  General exam: Appears calm and comfortable  Respiratory system: Minimal lateral rhonchi. Cardiovascular system: S1 & S2 heard, RRR. No JVD, murmurs, rubs, gallops or clicks. No pedal edema. Gastrointestinal system: Abdomen is nondistended, soft and nontender. No organomegaly or masses felt. Normal bowel sounds heard. Central nervous system: Alert and oriented. No focal  neurological deficits. Extremities: Symmetric 5 x 5 power. Skin: No rashes, lesions or ulcers Psychiatry: Judgement and insight appear normal. Mood & affect appropriate.                Diet Orders (From admission, onward)     Start     Ordered   02/07/24 2245  Diet Heart Room service appropriate? Yes; Fluid consistency: Thin  Diet effective now       Question Answer Comment  Room service appropriate? Yes   Fluid consistency: Thin      02/07/24 2245            Objective: Vitals:   02/08/24 0426 02/08/24 0740 02/08/24 0824 02/08/24 0827  BP: 126/65 (!) 151/65    Pulse: 84 82    Resp: 18 18    Temp: 98.5 F (36.9 C) 97.8 F (36.6 C)    TempSrc:  Oral    SpO2: 97% 95% 95% 95%  Weight:      Height:        Intake/Output Summary (Last 24 hours) at 02/08/2024 1135 Last data filed at 02/07/2024 2346 Gross per 24 hour  Intake 120 ml  Output --  Net 120 ml   Filed Weights   02/07/24 1544 02/07/24 2339  Weight: 95.3 kg 95.2 kg    Scheduled Meds:  aspirin EC  81 mg Oral Daily   cyanocobalamin  1,000 mcg Oral Daily   dextromethorphan-guaiFENesin  1 tablet Oral BID   diltiazem  120 mg Oral Daily   estradiol  0.5 mg Oral Daily   ezetimibe  10 mg Oral Daily   FLUoxetine  20 mg Oral Daily   gabapentin  300 mg Oral QHS   heparin  5,000 Units Subcutaneous Q8H  ipratropium-albuterol  3 mL Nebulization TID   isosorbide mononitrate  30 mg Oral Daily   metoprolol tartrate  12.5 mg Oral BID   mometasone-formoterol  2 puff Inhalation BID   montelukast  10 mg Oral QHS   pantoprazole  80 mg Oral Daily   potassium chloride  40 mEq Oral Once   rosuvastatin  20 mg Oral q1800   sodium chloride flush  3 mL Intravenous Q12H   Continuous Infusions:  azithromycin Stopped (02/08/24 0957)   cefTRIAXone (ROCEPHIN)  IV 2 g (02/07/24 2317)    Nutritional status     Body mass index is 34.93 kg/m.  Data Reviewed:   CBC: Recent Labs  Lab 02/07/24 1713 02/08/24 0505   WBC 11.8* 10.2  NEUTROABS 7.6  --   HGB 10.2* 10.0*  HCT 32.5* 31.2*  MCV 100.6* 101.3*  PLT 328 311   Basic Metabolic Panel: Recent Labs  Lab 02/07/24 1713 02/08/24 0505  NA 133* 136  K 3.7 3.4*  CL 101 104  CO2 23 24  GLUCOSE 92 104*  BUN 21 17  CREATININE 1.32* 1.20*  CALCIUM 8.4* 8.5*   GFR: Estimated Creatinine Clearance: 42.7 mL/min (A) (by C-G formula based on SCr of 1.2 mg/dL (H)). Liver Function Tests: No results for input(s): "AST", "ALT", "ALKPHOS", "BILITOT", "PROT", "ALBUMIN" in the last 168 hours. No results for input(s): "LIPASE", "AMYLASE" in the last 168 hours. No results for input(s): "AMMONIA" in the last 168 hours. Coagulation Profile: No results for input(s): "INR", "PROTIME" in the last 168 hours. Cardiac Enzymes: No results for input(s): "CKTOTAL", "CKMB", "CKMBINDEX", "TROPONINI" in the last 168 hours. BNP (last 3 results) Recent Labs    07/27/23 1231  PROBNP 118.0*   HbA1C: No results for input(s): "HGBA1C" in the last 72 hours. CBG: No results for input(s): "GLUCAP" in the last 168 hours. Lipid Profile: No results for input(s): "CHOL", "HDL", "LDLCALC", "TRIG", "CHOLHDL", "LDLDIRECT" in the last 72 hours. Thyroid Function Tests: No results for input(s): "TSH", "T4TOTAL", "FREET4", "T3FREE", "THYROIDAB" in the last 72 hours. Anemia Panel: No results for input(s): "VITAMINB12", "FOLATE", "FERRITIN", "TIBC", "IRON", "RETICCTPCT" in the last 72 hours. Sepsis Labs: Recent Labs  Lab 02/08/24 0505  PROCALCITON 0.10    Recent Results (from the past 240 hours)  Resp panel by RT-PCR (RSV, Flu A&B, Covid) Anterior Nasal Swab     Status: None   Collection Time: 02/07/24  5:13 PM   Specimen: Anterior Nasal Swab  Result Value Ref Range Status   SARS Coronavirus 2 by RT PCR NEGATIVE NEGATIVE Final    Comment: (NOTE) SARS-CoV-2 target nucleic acids are NOT DETECTED.  The SARS-CoV-2 RNA is generally detectable in upper respiratory specimens  during the acute phase of infection. The lowest concentration of SARS-CoV-2 viral copies this assay can detect is 138 copies/mL. A negative result does not preclude SARS-Cov-2 infection and should not be used as the sole basis for treatment or other patient management decisions. A negative result may occur with  improper specimen collection/handling, submission of specimen other than nasopharyngeal swab, presence of viral mutation(s) within the areas targeted by this assay, and inadequate number of viral copies(<138 copies/mL). A negative result must be combined with clinical observations, patient history, and epidemiological information. The expected result is Negative.  Fact Sheet for Patients:  BloggerCourse.com  Fact Sheet for Healthcare Providers:  SeriousBroker.it  This test is no t yet approved or cleared by the [place] FDA and  has been authorized for detection and/or  diagnosis of SARS-CoV-2 by FDA under an Emergency Use Authorization (EUA). This EUA will remain  in effect (meaning this test can be used) for the duration of the COVID-19 declaration under Section 564(b)(1) of the Act, 21 U.S.C.section 360bbb-3(b)(1), unless the authorization is terminated  or revoked sooner.       Influenza A by PCR NEGATIVE NEGATIVE Final   Influenza B by PCR NEGATIVE NEGATIVE Final    Comment: (NOTE) The Xpert Xpress SARS-CoV-2/FLU/RSV plus assay is intended as an aid in the diagnosis of influenza from Nasopharyngeal swab specimens and should not be used as a sole basis for treatment. Nasal washings and aspirates are unacceptable for Xpert Xpress SARS-CoV-2/FLU/RSV testing.  Fact Sheet for Patients: BloggerCourse.com  Fact Sheet for Healthcare Providers: SeriousBroker.it  This test is not yet approved or cleared by the [place] FDA and has been authorized for detection  and/or diagnosis of SARS-CoV-2 by FDA under an Emergency Use Authorization (EUA). This EUA will remain in effect (meaning this test can be used) for the duration of the COVID-19 declaration under Section 564(b)(1) of the Act, 21 U.S.C. section 360bbb-3(b)(1), unless the authorization is terminated or revoked.     Resp Syncytial Virus by PCR NEGATIVE NEGATIVE Final    Comment: (NOTE) Fact Sheet for Patients: BloggerCourse.com  Fact Sheet for Healthcare Providers: SeriousBroker.it  This test is not yet approved or cleared by the [place] FDA and has been authorized for detection and/or diagnosis of SARS-CoV-2 by FDA under an Emergency Use Authorization (EUA). This EUA will remain in effect (meaning this test can be used) for the duration of the COVID-19 declaration under Section 564(b)(1) of the Act, 21 U.S.C. section 360bbb-3(b)(1), unless the authorization is terminated or revoked.  Performed at [facility], [address], [place], [place] [address]          Radiology Studies: CT Angio Chest PE W and/or Wo Contrast Result Date: 02/07/2024 CLINICAL DATA:  Pulmonary embolism (PE) suspected, low to intermediate prob, neg D-dimer Pulmonary embolism (PE) suspected, low to intermediate prob, positive D-dimer EXAM: CT ANGIOGRAPHY CHEST WITH CONTRAST TECHNIQUE: Multidetector CT imaging of the chest was performed using the standard protocol during bolus administration of intravenous contrast. Multiplanar CT image reconstructions and MIPs were obtained to evaluate the vascular anatomy. RADIATION DOSE REDUCTION: This exam was performed according to the departmental dose-optimization program which includes automated exposure control, adjustment of the mA and/or kV according to patient size and/or use of iterative reconstruction technique. CONTRAST:  60mL OMNIPAQUE IOHEXOL 350 MG/ML SOLN COMPARISON:  CT chest 05/18/2023,  chest x-ray [id number] FINDINGS: Cardiovascular: Normal heart size. No significant pericardial effusion. The thoracic aorta is normal in caliber. Moderate atherosclerotic plaque of the thoracic aorta. Four-vessel coronary artery calcifications. Aortic valve leaflet calcification. Mediastinum/Nodes: No enlarged mediastinal, hilar, or axillary lymph nodes. Thyroid gland, trachea, and esophagus demonstrate no significant findings. Lungs/Pleura: Mosaic attenuation of the lungs with diffuse patchy ground-glass airspace opacities. No pulmonary nodule. No pulmonary mass. No pleural effusion. No pneumothorax. Upper Abdomen: Colonic diverticulosis.  No acute abnormality. Musculoskeletal: Asymmetric right breast tissue with a grossly stable in size 1.3 x 1.4 cm rounded mass. No suspicious lytic or blastic osseous lesions. No acute displaced fracture. Multilevel mild degenerative changes of the spine. Review of the MIP images confirms the above findings. IMPRESSION: 1. No pulmonary embolus. 2. Patchy ground-glass airspace opacities of the lung suggestive of infection/inflammation (finding could be representative of COVID). 3. Aortic Atherosclerosis (ICD10-I70.0) including four-vessel coronary calcification and  aortic valve leaflet calcification of an correlate for aortic stenosis. 4. Asymmetric right breast tissue with a grossly stable in size 1.3 x 1.4 cm rounded mass. Recommend correlation with physical exam and clinical history. If clinically indicated, please obtain outpatient diagnostic mammography. Electronically Signed   By: Tish Frederickson M.D.   On: 02/07/2024 21:25   DG Chest 2 View Result Date: 02/07/2024 CLINICAL DATA:  Shortness of breath.  Hypoxia.  Pneumonia on x-ray. EXAM: CHEST - 2 VIEW COMPARISON:  Chest radiograph 09/26/2022 FINDINGS: Right upper lobe opacity typical of pneumonia. There is also suspected patchy left lung base opacity. Stable heart size and mediastinal contours. No pleural fluid or pneumothorax.  IMPRESSION: 1. Right upper lobe opacity typical of pneumonia. 2. Suspected patchy left lung base opacity, also suspicious for pneumonia. Electronically Signed   By: Narda Rutherford M.D.   On: 02/07/2024 18:25           LOS: 1 day   Time spent= 35 mins    Miguel Rota, MD [facility]  If 7PM-7AM, please contact night-coverage  02/08/2024, 11:35 AM

## 2024-02-09 DIAGNOSIS — J849 Interstitial pulmonary disease, unspecified: Secondary | ICD-10-CM

## 2024-02-09 DIAGNOSIS — J189 Pneumonia, unspecified organism: Secondary | ICD-10-CM | POA: Diagnosis not present

## 2024-02-09 DIAGNOSIS — M3502 Sicca syndrome with lung involvement: Principal | ICD-10-CM

## 2024-02-09 DIAGNOSIS — J9601 Acute respiratory failure with hypoxia: Secondary | ICD-10-CM

## 2024-02-09 LAB — BASIC METABOLIC PANEL WITH GFR
Anion gap: 7 (ref 5–15)
BUN: 15 mg/dL (ref 8–23)
CO2: 21 mmol/L — ABNORMAL LOW (ref 22–32)
Calcium: 8.7 mg/dL — ABNORMAL LOW (ref 8.9–10.3)
Chloride: 109 mmol/L (ref 98–111)
Creatinine, Ser: 1.04 mg/dL — ABNORMAL HIGH (ref 0.44–1.00)
GFR, Estimated: 54 mL/min — ABNORMAL LOW (ref >60)
Glucose, Bld: 106 mg/dL — ABNORMAL HIGH (ref 70–99)
Potassium: 4.3 mmol/L (ref 3.5–5.1)
Sodium: 137 mmol/L (ref 135–145)

## 2024-02-09 LAB — CBC
HCT: 31.2 % — ABNORMAL LOW (ref 36.0–46.0)
Hemoglobin: 9.7 g/dL — ABNORMAL LOW (ref 12.0–15.0)
MCH: 32.8 pg (ref 26.0–34.0)
MCHC: 31.1 g/dL (ref 30.0–36.0)
MCV: 105.4 fL — ABNORMAL HIGH (ref 80.0–100.0)
Platelets: 324 10*3/uL (ref 150–400)
RBC: 2.96 MIL/uL — ABNORMAL LOW (ref 3.87–5.11)
RDW: 12.6 % (ref 11.5–15.5)
WBC: 10 10*3/uL (ref 4.0–10.5)
nRBC: 0 % (ref 0.0–0.2)

## 2024-02-09 LAB — RESPIRATORY PANEL BY PCR

## 2024-02-09 LAB — MAGNESIUM: Magnesium: 2.1 mg/dL (ref 1.7–2.4)

## 2024-02-09 LAB — PHOSPHORUS: Phosphorus: 4.4 mg/dL (ref 2.5–4.6)

## 2024-02-09 MED ORDER — POLYVINYL ALCOHOL 1.4 % OP SOLN: 1.0000 [drp] | OPHTHALMIC | Status: DC | PRN

## 2024-02-09 MED ORDER — PREDNISONE 20 MG PO TABS: 40.0000 mg | ORAL_TABLET | Freq: Every day | ORAL | Status: DC

## 2024-02-09 MED ORDER — FUROSEMIDE 10 MG/ML IJ SOLN
20.0000 mg | Freq: Once | INTRAMUSCULAR | Status: AC
  Administered 2024-02-09: 20 mg via INTRAVENOUS
  Filled 2024-02-09: qty 2

## 2024-02-09 MED ORDER — FUROSEMIDE 10 MG/ML IJ SOLN
20.0000 mg | Freq: Once | INTRAMUSCULAR | Status: AC
  Administered 2024-02-10: 20 mg via INTRAVENOUS
  Filled 2024-02-09: qty 2

## 2024-02-09 MED ORDER — PROPOFOL 1000 MG/100ML IV EMUL: 0.0000 ug/kg/min | INTRAVENOUS | Status: DC

## 2024-02-09 MED ORDER — PREDNISONE 20 MG PO TABS
40.0000 mg | ORAL_TABLET | Freq: Two times a day (BID) | ORAL | Status: DC
  Administered 2024-02-09 – 2024-02-12 (×7): 40 mg via ORAL
  Filled 2024-02-09 (×7): qty 2

## 2024-02-09 MED ORDER — DOCUSATE SODIUM 50 MG/5ML PO LIQD
100.0000 mg | Freq: Two times a day (BID) | ORAL | Status: DC
Start: 2024-02-09 — End: 2024-02-09

## 2024-02-09 MED ORDER — ENSURE ENLIVE PO LIQD
237.0000 mL | Freq: Two times a day (BID) | ORAL | Status: DC
  Administered 2024-02-12 (×2): 237 mL via ORAL

## 2024-02-09 MED ORDER — POLYETHYLENE GLYCOL 3350 17 G PO PACK
17.0000 g | PACK | Freq: Every day | ORAL | Status: DC
  Filled 2024-02-09 (×2): qty 1

## 2024-02-09 MED ORDER — DOCUSATE SODIUM 100 MG PO CAPS
100.0000 mg | ORAL_CAPSULE | Freq: Two times a day (BID) | ORAL | Status: DC
  Filled 2024-02-09: qty 1

## 2024-02-09 MED ORDER — POLYETHYLENE GLYCOL 3350 17 G PO PACK
17.0000 g | PACK | Freq: Every day | ORAL | Status: DC
  Filled 2024-02-09: qty 1

## 2024-02-09 NOTE — Consult Note (Signed)
 NAME:  Donna Williamson, MRN:  [id number], DOB:  07/14/1946, LOS: 2 ADMISSION DATE:  02/07/2024, CONSULTATION DATE:  02/09/2024 REFERRING MD:  Dr. Elvera Lennox - [facility], CHIEF COMPLAINT:  Resp distress    History of Present Illness:  Donna Williamson is a 78 y.o. female with a PMH significant for CHF, COPD, HTN, HLD, mild aortic stenosis, mitral valve prolapse, prior NSTEMI in 2017, Sjogren's syndrome, and anemia who presented to the ED at [facility] from PCP for hypoxic respiratory failure and signs of On outpatient CXR 4/9.  Patient states symptoms began 4 days prior to admission. She denies any fever or prior to admission.  CTA chest negative for PE on admission.  Of note patient recently established care with Dr. Marchelle Gearing, last seen 10/02/23 for workup of ILD in the setting of Sjogren's syndrome, decision at this time for to hold on antifibrotic with plans to establish with Rheumatologist. Soonest available apt with Rheumatologist was June so patient has not seen them yet.   Pertinent  Medical History  CHF, COPD, HTN, HLD, mild aortic stenosis, mitral valve prolapse, prior NSTEMI in 2017, Sjogren's syndrome, and anemia  Significant Hospital Events: Including procedures, antibiotic start and stop dates in addition to other pertinent events   4/9 presented for hypoxic respiratory failure with palpation CXR consistent with pneumonia 4/11 Remains on 2L [place]  Interim History / Subjective:  Seen sitting up in bed side recline with no acute complains  Objective   Blood pressure (!) 125/59, pulse 86, temperature 98.5 F (36.9 C), resp. rate 18, height 5\' 5"  (1.651 m), weight 95.2 kg, SpO2 (!) 88%.        Intake/Output Summary (Last 24 hours) at 02/09/2024 1436 Last data filed at 02/09/2024 0600 Gross per 24 hour  Intake 1186.71 ml  Output --  Net 1186.71 ml   Filed Weights   02/07/24 1544 02/07/24 2339  Weight: 95.3 kg 95.2 kg    Examination: General: Very pleasant elderly female sitting  up in bed seide recline in NAD  HEENT: [place]/AT, MM pink/moist, PERRL,  Neuro: Alert and oriented x3, non-focal  CV: s1s2 regular rate and rhythm, no murmur, rubs, or gallops,  PULM:  Clear to auscultation, no increased work of breathing, no added breath sounds GI: soft, bowel sounds active in all 4 quadrants, non-tender, non-distended, tolerating oral diet Extremities: warm/dry, no edema  Skin: no rashes or lesions  Resolved Hospital Problem list     Assessment & Plan:  Acute hypoxic respiratory failure Mild interstitial lung disease secondary to Sjogren's syndrome likely in mild flare  -High-resolution CT scan chest July 2024 was interpreted as probable UIP  Community-acquired pneumonia -CTA chest  Patchy ground-glass airspace opacities of the lung suggestive of infection/inflammation History of COPD P: Continue Azithromycin and Ceftriaxone  Start steroid taper  Mobilize as able  Aspiration precautions  Patient has a follow up apt with Dr. Marchelle Gearing 04/02/2024  Best Practice (right click and "Reselect all SmartList Selections" daily)  Per primary   Labs   CBC: Recent Labs  Lab 02/07/24 1713 02/08/24 0505 02/09/24 0452  WBC 11.8* 10.2 10.0  NEUTROABS 7.6  --   --   HGB 10.2* 10.0* 9.7*  HCT 32.5* 31.2* 31.2*  MCV 100.6* 101.3* 105.4*  PLT 328 311 324    Basic Metabolic Panel: Recent Labs  Lab 02/07/24 1713 02/08/24 0505 02/09/24 0452  NA 133* 136 137  K 3.7 3.4* 4.3  CL 101 104 109  CO2 23 24 21*  GLUCOSE 92 104* 106*  BUN 21 17 15   CREATININE 1.32* 1.20* 1.04*  CALCIUM 8.4* 8.5* 8.7*  MG  --   --  2.1  PHOS  --   --  4.4   GFR: Estimated Creatinine Clearance: 49.2 mL/min (A) (by C-G formula based on SCr of 1.04 mg/dL (H)). Recent Labs  Lab 02/07/24 1713 02/08/24 0505 02/09/24 0452  PROCALCITON  --  0.10  --   WBC 11.8* 10.2 10.0    Liver Function Tests: No results for input(s): "AST", "ALT", "ALKPHOS", "BILITOT", "PROT", "ALBUMIN" in the last 168  hours. No results for input(s): "LIPASE", "AMYLASE" in the last 168 hours. No results for input(s): "AMMONIA" in the last 168 hours.  ABG    Component Value Date/Time   PHART 7.414 05/02/2016 1436   PCO2ART 35.6 05/02/2016 1436   PO2ART 64.0 (L) 05/02/2016 1436   HCO3 22.8 05/02/2016 1436   TCO2 24 05/02/2016 1436   ACIDBASEDEF 1.0 05/02/2016 1436   O2SAT 92.0 05/02/2016 1436     Coagulation Profile: No results for input(s): "INR", "PROTIME" in the last 168 hours.  Cardiac Enzymes: No results for input(s): "CKTOTAL", "CKMB", "CKMBINDEX", "TROPONINI" in the last 168 hours.  HbA1C: Hgb A1c MFr Bld  Date/Time Value Ref Range Status  02/16/2021 10:57 PM 6.1 (H) 4.8 - 5.6 % Final    Comment:    (NOTE) Pre diabetes:          5.7%-6.4%  Diabetes:              >6.4%  Glycemic control for   <7.0% adults with diabetes   05/01/2016 05:01 PM 5.8 (H) 4.8 - 5.6 % Final    Comment:    (NOTE)         Pre-diabetes: 5.7 - 6.4         Diabetes: >6.4         Glycemic control for adults with diabetes: <7.0     CBG: No results for input(s): "GLUCAP" in the last 168 hours.  Review of Systems:   Please see the history of present illness. All other systems reviewed and are negative    Past Medical History:  She,  has a past medical history of Anemia, Anginal pain (HCC), Anxiety, Arthritis, Asthma, CAD (coronary artery disease), Chest pain, CHF (congestive heart failure) (HCC), Chronic bronchitis (HCC), Chronic lower back pain, COPD (chronic obstructive pulmonary disease) (HCC), GERD (gastroesophageal reflux disease), History of blood transfusion (1960s), Hypercholesterolemia, Hypertension, Lower extremity edema, Mild aortic stenosis (03/07/2023), Mitral valve prolapse, NSTEMI (non-ST elevated myocardial infarction) (HCC) (05/01/2016), Pneumonia (05/01/2016), Seasonal allergies, Sjogren's syndrome (HCC), and Wheezing.   Surgical History:   Past Surgical History:  Procedure Laterality  Date   ABDOMINAL HYSTERECTOMY  1960s   "partial"   CARDIAC CATHETERIZATION N/A 05/02/2016   Procedure: Right/Left Heart Cath and Coronary Angiography;  Surgeon: Corky Crafts, MD;  [facility] INVASIVE CV LAB;  Service: Cardiovascular;  Laterality: N/A;   CARDIAC CATHETERIZATION N/A 05/02/2016   Procedure: Coronary Stent Intervention;  Surgeon: Corky Crafts, MD;  Location: [facility] INVASIVE CV LAB;  Service: Cardiovascular;  Laterality: N/A;   CATARACT EXTRACTION W/PHACO Right 09/20/2019   Procedure: CATARACT EXTRACTION PHACO AND INTRAOCULAR LENS PLACEMENT (IOC) RIGHT;  Surgeon: Galen Manila, MD;  Location: [facility] ORS;  Service: Ophthalmology;  Laterality: Right;  [place] 01:29.4 CDE 19.28 FLUID PACK LOT # [id number]   CATARACT EXTRACTION W/PHACO Left 10/15/2019   Procedure: CATARACT EXTRACTION PHACO AND INTRAOCULAR LENS PLACEMENT (IOC) LEFT  8.76, 00:48.8;  Surgeon: Galen Manila, MD;  Location: [facility];  Service: Ophthalmology;  Laterality: Left;   COLONOSCOPY W/ BIOPSIES AND POLYPECTOMY     CORONARY ANGIOPLASTY WITH STENT PLACEMENT  05/02/2016   "1 stent"   CORONARY PRESSURE/FFR STUDY N/A 02/17/2021   Procedure: INTRAVASCULAR PRESSURE WIRE/FFR STUDY;  Surgeon: Kathleene Hazel, MD;  Location: [facility] INVASIVE CV LAB;  Service: Cardiovascular;  Laterality: N/A;   CORONARY STENT INTERVENTION N/A 02/17/2021   Procedure: CORONARY STENT INTERVENTION;  Surgeon: Kathleene Hazel, MD;  Location: [facility] INVASIVE CV LAB;  Service: Cardiovascular;  Laterality: N/A;   ESOPHAGOGASTRODUODENOSCOPY     JOINT REPLACEMENT Left 2007   hip   LAPAROSCOPIC SALPINGOOPHERECTOMY Bilateral ~ 1990   LEFT HEART CATH AND CORONARY ANGIOGRAPHY N/A 02/17/2021   Procedure: LEFT HEART CATH AND CORONARY ANGIOGRAPHY;  Surgeon: Kathleene Hazel, MD;  Location: [facility] INVASIVE CV LAB;  Service: Cardiovascular;  Laterality: N/A;   TOTAL HIP ARTHROPLASTY Left 2007     Social History:   reports that she  has never smoked. She has never used smokeless tobacco. She reports that she does not drink alcohol and does not use drugs.   Family History:  Her family history includes Asthma in her father; Breast cancer in her mother; Emphysema in her father; Heart disease in her father; Hypertension in her mother; Ovarian cancer in her sister; Stroke in her mother.   Allergies Allergies  Allergen Reactions   Levofloxacin Anaphylaxis   Plavix [Clopidogrel Bisulfate] Rash   Ciprofibrate     Other reaction(s): swelling, muscle aches   Codeine Nausea Only   Drug Ingredient [Ticagrelor] Other (See Comments)    Patient reports hemorrhage after medical trial of brilinta in 2017   Nitrofurantoin Other (See Comments)    VISION CHANGES   Ciprofloxacin Rash    Other Reaction(s): nausea and vomiting   Penicillins Rash   Sulfa Antibiotics Rash     Home Medications  Prior to Admission medications   Medication Sig Start Date End Date Taking? Authorizing Provider  acetaminophen (TYLENOL) 500 MG tablet Take 500-1,000 mg by mouth every 6 (six) hours as needed for moderate pain (pain score 4-6).   Yes [provider]  albuterol (PROVENTIL) (2.5 MG/3ML) 0.083% nebulizer solution Take 3 mLs (2.5 mg total) by nebulization every 2 (two) hours as needed for wheezing or shortness of breath. 05/04/16  Yes Kathlen Mody, MD  ALPRAZolam Prudy Feeler) 0.5 MG tablet Take 0.5 mg by mouth daily as needed for anxiety.    Yes [provider]  aspirin EC 81 MG tablet Take 81 mg by mouth daily.   Yes [provider]  benzonatate (TESSALON) 200 MG capsule Take 200 mg by mouth 3 (three) times daily as needed for cough. 01/18/24  Yes [provider]  budesonide-formoterol (SYMBICORT) 160-4.5 MCG/ACT inhaler Inhale 2 puffs into the lungs 2 (two) times daily.   Yes [provider]  carboxymethylcellulose (REFRESH PLUS) 0.5 % SOLN Place 1 drop into both eyes daily as needed (dry eyes).   Yes [provider]  Cholecalciferol (VITAMIN D3) 2000 UNITS TABS Take 2,000 Units by mouth daily.   Yes [provider]  cyanocobalamin 500 MCG tablet Take 1,000 mcg by mouth daily.   Yes [provider]  diltiazem (CARDIZEM CD) 120 MG 24 hr capsule Take 120 mg by mouth daily. 01/07/24  Yes [provider]  EPIPEN 2-PAK 0.3 MG/0.3ML SOAJ injection Inject 0.3 mg into the skin daily as needed (  allergic reaction).  11/01/13  Yes [provider]  estradiol (ESTRACE) 0.5 MG tablet Take 0.5 mg by mouth daily.   Yes [provider]  ezetimibe (ZETIA) 10 MG tablet Take 10 mg by mouth at bedtime.   Yes [provider]  FLUoxetine (PROZAC) 20 MG capsule Take 20 mg by mouth daily. 11/22/23  Yes [provider]  fluticasone (FLONASE) 50 MCG/ACT nasal spray Place 2 sprays into the nose daily.   Yes [provider]  furosemide (LASIX) 20 MG tablet Take 1 tablet (20 mg total) by mouth daily. 01/15/24  Yes Tonny Bollman, MD  gabapentin (NEURONTIN) 300 MG capsule Take 300 mg by mouth at bedtime. 01/07/24  Yes [provider]  inclisiran (LEQVIO) 284 MG/1.5ML SOSY injection Inject 284 mg into the skin once.   Yes [provider]  isosorbide mononitrate (IMDUR) 30 MG 24 hr tablet TAKE 1 TABLET(30 MG) BY MOUTH DAILY 02/13/23  Yes Tereso Newcomer T, PA-C  metoprolol tartrate (LOPRESSOR) 25 MG tablet TAKE 1/2 TABLET(12.5 MG) BY MOUTH TWICE DAILY 03/15/23  Yes Tonny Bollman, MD  montelukast (SINGULAIR) 10 MG tablet Take 10 mg by mouth at bedtime.   Yes [provider]  Multiple Vitamin (MULTIVITAMIN WITH MINERALS) TABS tablet Take 1 tablet by mouth daily.   Yes [provider]  nitroGLYCERIN (NITROSTAT) 0.4 MG SL tablet Place 1 tablet (0.4 mg total) under the tongue every 5 (five) minutes as needed for chest pain (up to 3 doses. If taking 3rd dose call 911). 02/04/22  Yes Weaver, Scott T, PA-C  pantoprazole (PROTONIX) 40 MG  tablet TAKE 2 TABLETS(80 MG) BY MOUTH DAILY 04/27/23  Yes Tonny Bollman, MD  potassium chloride (KLOR-CON) 10 MEQ tablet Take 10 mEq by mouth See admin instructions. Take 1 tablet (10 mEq) by mouth on Monday's and Thursday's. 05/09/22  Yes [provider]  PROAIR HFA 108 (90 BASE) MCG/ACT inhaler Inhale 1-2 puffs into the lungs every 4 (four) hours as needed for wheezing. 11/01/13  Yes [provider]  rosuvastatin (CRESTOR) 20 MG tablet Take 1 tablet (20 mg total) by mouth daily at 6 PM. 05/30/16  Yes Bhagat, Bhavinkumar, PA  doxycycline (VIBRA-TABS) 100 MG tablet Take 100 mg by mouth 2 (two) times daily. Patient not taking: Reported on 02/08/2024 01/18/24   [provider]  predniSONE (STERAPRED UNI-PAK 21 TAB) 10 MG (21) TBPK tablet SMARTSIG:- Tablet(s) By Mouth - Patient not taking: Reported on 02/08/2024 01/18/24   [provider]     Critical care time:   Imanuel Pruiett D. Harris, NP-C [facility] Personal contact information can be found on Amion  If no contact or response made please call [phone] 02/09/2024, 3:49 PM

## 2024-02-09 NOTE — Evaluation (Signed)
 Occupational Therapy Evaluation Patient Details Name: Donna Williamson MRN: [id number] DOB: [birth year] Today's Date: 02/09/2024   History of Present Illness   Donna Williamson is a 78 yr old female who presents with worsening dyspnea and hypoxia; admitted with pneumonia. PMH: CAD, prior angioplasty and stenting, COPD, GERD, HTN , Sjogren's syndrome, and HLD     Clinical Impressions The pt is currently presenting with the below listed deficits (see OT problem list). As such, she is presenting slightly below her baseline level of functioning for self-care management. She currently requires SBA for tasks, including lower body dressing, sit to stand, ambulation using a RW, and simulated toileting. She was noted to be on 2L O2 via nasal cannula with her O2 saturation being 83% on room after performing supine to sit, and 82% on 2L O2 with out of bed functional activity. She reported feelings of mild shortness of breath and fatigue with activity. She will benefit from further OT services in the acute care setting to maximize her independent with self-care tasks & to facilitate her safe return home. Anticipate no post-acute care therapy needs.      If plan is discharge home, recommend the following:   Assist for transportation;Assistance with cooking/housework     Functional Status Assessment   Patient has had a recent decline in their functional status and demonstrates the ability to make significant improvements in function in a reasonable and predictable amount of time.     Equipment Recommendations   None recommended by OT     Recommendations for Other Services         Precautions/Restrictions   Restrictions Weight Bearing Restrictions Per Provider Order: No     Mobility Bed Mobility Overal bed mobility: Modified Independent                  Transfers Overall transfer level: Needs assistance Equipment used: None Transfers: Sit to/from Stand Sit to Stand:  Supervision                  Balance Overall balance assessment: Mild deficits observed, not formally tested             ADL either performed or assessed with clinical judgement   ADL Overall ADL's : Needs assistance/impaired Eating/Feeding: Independent;Sitting   Grooming: Set up;Standing;Supervision/safety           Upper Body Dressing : Set up;Sitting   Lower Body Dressing: Set up;Supervision/safety;Sitting/lateral leans   Toilet Transfer: Supervision/safety;Ambulation                   Vision Baseline Vision/History: 1 Wears glasses Additional Comments: She correctly read the time depicted on the wall clock.            Pertinent Vitals/Pain Pain Assessment Pain Assessment: No/denies pain     Extremity/Trunk Assessment Upper Extremity Assessment Upper Extremity Assessment: Overall WFL for tasks assessed;Right hand dominant   Lower Extremity Assessment Lower Extremity Assessment: Overall WFL for tasks assessed      Communication Communication Communication: No apparent difficulties   Cognition Arousal: Alert Behavior During Therapy: WFL for tasks assessed/performed          OT - Cognition Comments: Oriented x4, friendly          Following commands: Intact                  Home Living Family/patient expects to be discharged to:: Private residence Living Arrangements: Spouse/significant other Available Help at Discharge: Family Type  of Home: House (Townhouse) Home Access: Stairs to enter Secretary/administrator of Steps: 1+1   Home Layout: Two level;1/2 bath on main level;Bed/bath upstairs Alternate Level Stairs-Number of Steps: bedroom and full bathroom upstairs   Bathroom Shower/Tub: Tub/shower unit         Home Equipment: Cane - single point;Rollator (4 wheels);Other (comment) (transport chair)   Additional Comments: lift chair, rollator is from family member      Prior Functioning/Environment Prior Level of  Function : Independent/Modified Independent             Mobility Comments:  (Occasional use of cane for ambulation outside the home.) ADLs Comments:  (Independent with ADLs, cooking, and cleaning.)    OT Problem List: Cardiopulmonary status limiting activity;Decreased activity tolerance   OT Treatment/Interventions: Self-care/ADL training;Therapeutic exercise;Energy conservation;DME and/or AE instruction;Therapeutic activities      OT Goals(Current goals can be found in the care plan section)   Acute Rehab OT Goals OT Goal Formulation: With patient Time For Goal Achievement: 02/23/24 Potential to Achieve Goals: Good ADL Goals Pt Will Perform Grooming: with modified independence;standing Pt Will Perform Lower Body Dressing: with modified independence;sitting/lateral leans;sit to/from stand Pt Will Transfer to Toilet: with modified independence;ambulating;grab bars Pt Will Perform Toileting - Clothing Manipulation and hygiene: with modified independence;sit to/from stand   OT Frequency:  Min 1X/week       AM-PAC OT "6 Clicks" Daily Activity     Outcome Measure Help from another person eating meals?: None Help from another person taking care of personal grooming?: None Help from another person toileting, which includes using toliet, bedpan, or urinal?: None Help from another person bathing (including washing, rinsing, drying)?: A Little Help from another person to put on and taking off regular upper body clothing?: None Help from another person to put on and taking off regular lower body clothing?: None 6 Click Score: 23   End of Session Equipment Utilized During Treatment: Rolling walker (2 wheels);Oxygen Nurse Communication: Other (comment) (O2 saturation)  Activity Tolerance: Other (comment) (Fair+ tolerance) Patient left: in bed;with call bell/phone within reach  OT Visit Diagnosis: Muscle weakness (generalized) (M62.81)                Time: 1610-9604 OT Time  Calculation (min): 19 min Charges:  OT General Charges $OT Visit: 1 Visit OT Evaluation $OT Eval Moderate Complexity: 1 Mod    Jazziel Fitzsimmons L Markevius Trombetta, OTR/L 02/09/2024, 2:00 PM

## 2024-02-09 NOTE — Progress Notes (Signed)
 Pt refusing CPAP at this time. No CPAP machine in room. Pt is currently on 2L [place] and is tolerating well. No distress noted. Will continue to monitor pt.

## 2024-02-09 NOTE — Evaluation (Signed)
 Physical Therapy Evaluation Patient Details Name: Donna Williamson MRN: [id number] DOB: 1946-05-01 Today's Date: 02/09/2024  History of Present Illness  Donna Williamson is a 78 y.o. female presents with worsening dyspnea and hypoxia; admitted with pneumonia. PMH: CAD, prior angioplasty and stenting, COPD, GERD, HTN , Sjogren's syndrome, and HLD  Clinical Impression  Pt admitted with above diagnosis. Pt reports from home with spouse, ind at baseline, using SPC on the stairs due to "bad knees" and holds to spouse's arm when amb in community. Pt reports has mother in law's rollator walker and mom's lift chair that she can use if needed in the home. On eval, pt desat to 80% on RA with ambulation to restroom, reports "maybe a little dizzy" and dyspnea 2/4, noted to have cold hands, returned 2L and improves to >92% with ~3 minutes of seated pursed lip breathing- RN and MD notified. Provided pt with warm blanket, tolerates sitting up in recliner with lunch tray. Anticipate no f/u PT at discharge. Pt currently with functional limitations due to the deficits listed below (see PT Problem List). Pt will benefit from acute skilled PT to increase their independence and safety with mobility to allow discharge.           If plan is discharge home, recommend the following: Assistance with cooking/housework;Assist for transportation;Help with stairs or ramp for entrance   Can travel by private vehicle        Equipment Recommendations None recommended by PT  Recommendations for Other Services       Functional Status Assessment Patient has had a recent decline in their functional status and demonstrates the ability to make significant improvements in function in a reasonable and predictable amount of time.     Precautions / Restrictions        Mobility  Bed Mobility Overal bed mobility: Modified Independent                  Transfers Overall transfer level: Needs assistance Equipment  used: None Transfers: Sit to/from Stand Sit to Stand: Supervision           General transfer comment: supv to power to stand, no AD, BLE braced against bed    Ambulation/Gait Ambulation/Gait assistance: Supervision Gait Distance (Feet): 15 Feet (x2) Assistive device: None Gait Pattern/deviations: Step-through pattern, Decreased stride length Gait velocity: decreased     General Gait Details: step through gait pattern, no AD with pt reaching to furniture and walls with in room ambulation to steady self, dyspnea 2/4, on RA desat to 80%, returned 2L and improves to >92% with ~3 minutes of seated pursed lip breathing  Stairs            Wheelchair Mobility     Tilt Bed    Modified Rankin (Stroke Patients Only)       Balance Overall balance assessment: Mild deficits observed, not formally tested                                           Pertinent Vitals/Pain Pain Assessment Pain Assessment: No/denies pain    Home Living Family/patient expects to be discharged to:: Private residence Living Arrangements: Spouse/significant other Available Help at Discharge: Family;Available 24 hours/day Type of Home: House (townhome)         Home Layout: Two level;1/2 bath on main level;Bed/bath upstairs Home Equipment: Cane - single point;Rollator (4  wheels) Additional Comments: lift chair, rollator is from family member    Prior Function Prior Level of Function : Independent/Modified Independent             Mobility Comments: pt reports ind, using SPC on stairs, holds husband's arm with community ambulation, reports "bad knees" ADLs Comments: pt reports ind     Extremity/Trunk Assessment   Upper Extremity Assessment Upper Extremity Assessment: Defer to OT evaluation    Lower Extremity Assessment Lower Extremity Assessment: Overall WFL for tasks assessed (AROM WFL, strength grossly 4+/5, denies numbness/tingling)    Cervical / Trunk  Assessment Cervical / Trunk Assessment: Normal  Communication   Communication Communication: No apparent difficulties    Cognition Arousal: Alert Behavior During Therapy: WFL for tasks assessed/performed   PT - Cognitive impairments: No apparent impairments                         Following commands: Intact       Cueing       General Comments      Exercises     Assessment/Plan    PT Assessment Patient needs continued PT services  PT Problem List Decreased activity tolerance;Decreased balance;Decreased knowledge of use of DME;Cardiopulmonary status limiting activity       PT Treatment Interventions DME instruction;Gait training;Stair training;Functional mobility training;Therapeutic activities;Therapeutic exercise;Balance training;Cognitive remediation;Patient/family education    PT Goals (Current goals can be found in the Care Plan section)  Acute Rehab PT Goals Patient Stated Goal: return home PT Goal Formulation: With patient Time For Goal Achievement: 02/23/24 Potential to Achieve Goals: Good    Frequency Min 3X/week     Co-evaluation               AM-PAC PT "6 Clicks" Mobility  Outcome Measure Help needed turning from your back to your side while in a flat bed without using bedrails?: None Help needed moving from lying on your back to sitting on the side of a flat bed without using bedrails?: None Help needed moving to and from a bed to a chair (including a wheelchair)?: A Little Help needed standing up from a chair using your arms (e.g., wheelchair or bedside chair)?: A Little Help needed to walk in hospital room?: A Little Help needed climbing 3-5 steps with a railing? : A Lot 6 Click Score: 19    End of Session Equipment Utilized During Treatment: Oxygen Activity Tolerance: Patient tolerated treatment well Patient left: in chair;with call bell/phone within reach Nurse Communication: Mobility status;Other (comment) (SPO2) PT Visit  Diagnosis: Other abnormalities of gait and mobility (R26.89)    Time: 4132-4401 PT Time Calculation (min) (ACUTE ONLY): 21 min   Charges:   PT Evaluation $PT Eval Low Complexity: 1 Low   PT General Charges $$ ACUTE PT VISIT: 1 Visit         Tori Vennessa Affinito PT, DPT 02/09/24, 12:30 PM

## 2024-02-09 NOTE — Progress Notes (Addendum)
 PROGRESS NOTE  Donna Williamson WUJ:[id number] DOB: January 18, 1946 DOA: 02/07/2024 PCP: Rodrigo Ran, MD   LOS: 2 days   Brief Narrative / Interim history: 78 year old with history of CAD prior angioplasty/PCI, COPD, GERD, HTN, Sjogren, HLD comes to the hospital with worsening hypoxia and shortness of breath.  CTA chest shows groundglass opacities and asymmetric right breast tissue/round mass.  She is admitted with concerns of bilateral community-acquired pneumonia.  Subjective / 24h Interval events: She is feeling much better today, she feels like she is having a lot more energy  Assesement and Plan: Principal Problem:   Pneumonia, organism unspecified(486) Active Problems:   Essential hypertension   GERD (gastroesophageal reflux disease)   Stage 3a chronic kidney disease (HCC)   Principal problem Acute hypoxic respiratory failure secondary to multifocal pneumonia -patient was started on ceftriaxone azithromycin, continue.  Continue supportive care, COVID, flu, RSV all negative.  For completeness I will check a respiratory virus panel -Given groundglass infiltrates, patient was informally discussed with pulmonology, CT scan reviewed, this is unlikely to be caused by her Sjogren's disease and is likely to be steroid responsive process -Start prednisone   Active problems Abnormal right breast tissue/mass  - Should get mammogram (or better, MRI) outpatient with PCP.  Patient aware and she will follow this up outpatient with her PCP   CAD status post PCI - Currently chest pain-free.  On aspirin, statin, Zetia, Cardizem, Imdur   GERD -  Protonix   History of Sjogren's - Supportive care   Essential hypertension - Continue Imdur, Cardizem.  IV as needed   Peripheral neuropathy  - Gabapentin  Scheduled Meds:  aspirin EC  81 mg Oral Daily   cyanocobalamin  1,000 mcg Oral Daily   dextromethorphan-guaiFENesin  1 tablet Oral BID   diltiazem  120 mg Oral Daily   estradiol  0.5 mg Oral  Daily   ezetimibe  10 mg Oral Daily   feeding supplement  237 mL Oral BID BM   FLUoxetine  20 mg Oral Daily   gabapentin  300 mg Oral QHS   heparin  5,000 Units Subcutaneous Q8H   ipratropium-albuterol  3 mL Nebulization TID   isosorbide mononitrate  30 mg Oral Daily   metoprolol tartrate  12.5 mg Oral BID   mometasone-formoterol  2 puff Inhalation BID   montelukast  10 mg Oral QHS   pantoprazole  80 mg Oral Daily   predniSONE  40 mg Oral Q breakfast   rosuvastatin  20 mg Oral q1800   sodium chloride flush  3 mL Intravenous Q12H   Continuous Infusions:  azithromycin 500 mg (02/09/24 0940)   cefTRIAXone (ROCEPHIN)  IV 2 g (02/08/24 2258)   PRN Meds:.acetaminophen **OR** acetaminophen, ALPRAZolam, bisacodyl, hydrALAZINE, ipratropium-albuterol, metoprolol tartrate, ondansetron **OR** ondansetron (ZOFRAN) IV, polyethylene glycol, senna-docusate, traMADol, traZODone  Current Outpatient Medications  Medication Instructions   acetaminophen (TYLENOL) 500-1,000 mg, Every 6 hours PRN   albuterol (PROVENTIL) 2.5 mg, Nebulization, Every 2 hours PRN   ALPRAZolam (XANAX) 0.5 mg, Oral, Daily PRN   aspirin EC 81 mg, Oral, Daily   benzonatate (TESSALON) 200 mg, Oral, 3 times daily PRN   budesonide-formoterol (SYMBICORT) 160-4.5 MCG/ACT inhaler 2 puffs, Inhalation, 2 times daily   carboxymethylcellulose (REFRESH PLUS) 0.5 % SOLN 1 drop, Both Eyes, Daily PRN   cyanocobalamin (VITAMIN B12) 1,000 mcg, Oral, Daily   diltiazem (CARDIZEM CD) 120 mg, Oral, Daily   doxycycline (VIBRA-TABS) 100 mg, 2 times daily   EpiPen 2-Pak 0.3 mg, Subcutaneous, Daily  PRN   estradiol (ESTRACE) 0.5 mg, Oral, Daily   ezetimibe (ZETIA) 10 mg, Oral, Daily at bedtime   FLUoxetine (PROZAC) 20 mg, Oral, Daily   fluticasone (FLONASE) 50 MCG/ACT nasal spray 2 sprays, Nasal, Daily   furosemide (LASIX) 20 mg, Oral, Daily   gabapentin (NEURONTIN) 300 mg, Oral, Nightly   inclisiran (LEQVIO) 284 mg, Subcutaneous,  Once    isosorbide mononitrate (IMDUR) 30 MG 24 hr tablet TAKE 1 TABLET(30 MG) BY MOUTH DAILY   metoprolol tartrate (LOPRESSOR) 25 MG tablet TAKE 1/2 TABLET(12.5 MG) BY MOUTH TWICE DAILY   montelukast (SINGULAIR) 10 mg, Oral, Daily at bedtime   Multiple Vitamin (MULTIVITAMIN WITH MINERALS) TABS tablet 1 tablet, Oral, Daily   nitroGLYCERIN (NITROSTAT) 0.4 mg, Sublingual, Every 5 min PRN   pantoprazole (PROTONIX) 40 MG tablet TAKE 2 TABLETS(80 MG) BY MOUTH DAILY   potassium chloride (KLOR-CON) 10 MEQ tablet 10 mEq, Oral, See admin instructions, Take 1 tablet (10 mEq) by mouth on Monday's and Thursday's.    predniSONE (STERAPRED UNI-PAK 21 TAB) 10 MG (21) TBPK tablet SMARTSIG:- Tablet(s) By Mouth -   PROAIR HFA 108 (90 BASE) MCG/ACT inhaler 1-2 puffs, Inhalation, Every 4 hours PRN   rosuvastatin (CRESTOR) 20 mg, Oral, Daily-1800   Vitamin D3 2,000 Units, Oral, Daily    Diet Orders (From admission, onward)     Start     Ordered   02/07/24 2245  Diet Heart Room service appropriate? Yes; Fluid consistency: Thin  Diet effective now       Question Answer Comment  Room service appropriate? Yes   Fluid consistency: Thin      02/07/24 2245            DVT prophylaxis: heparin injection 5,000 Units Start: 02/08/24 0600 SCDs Start: 02/07/24 2245 Place TED hose Start: 02/07/24 2245   Lab Results  Component Value Date   PLT 324 02/09/2024      Code Status: Full Code  Family Communication: no family at bedside   Status is: Inpatient Remains inpatient appropriate because: severity of illness  Level of care: Med-Surg  Consultants:  none  Objective: Vitals:   02/09/24 0741 02/09/24 0745 02/09/24 0922 02/09/24 0923  BP:   (!) 143/62 (!) 143/62  Pulse:   81 81  Resp:      Temp:      TempSrc:      SpO2: 98% 98% 98%   Weight:      Height:        Intake/Output Summary (Last 24 hours) at 02/09/2024 1329 Last data filed at 02/09/2024 0600 Gross per 24 hour  Intake 1186.71 ml  Output --   Net 1186.71 ml   Wt Readings from Last 3 Encounters:  02/07/24 95.2 kg  10/02/23 95.3 kg  09/18/23 97.1 kg    Examination:  Constitutional: NAD Eyes: no scleral icterus ENMT: Mucous membranes are moist.  Neck: normal, supple Respiratory: Bibasilar rhonchi, no wheezing Cardiovascular: Regular rate and rhythm, no murmurs / rubs / gallops. No LE edema.  Abdomen: non distended, no tenderness. Bowel sounds positive.  Musculoskeletal: no clubbing / cyanosis.   Data Reviewed: I have independently reviewed following labs and imaging studies   CBC Recent Labs  Lab 02/07/24 1713 02/08/24 0505 02/09/24 0452  WBC 11.8* 10.2 10.0  HGB 10.2* 10.0* 9.7*  HCT 32.5* 31.2* 31.2*  PLT 328 311 324  MCV 100.6* 101.3* 105.4*  MCH 31.6 32.5 32.8  MCHC 31.4 32.1 31.1  RDW 12.4 12.5 12.6  LYMPHSABS 3.0  --   --   MONOABS 0.6  --   --   EOSABS 0.6*  --   --   BASOSABS 0.0  --   --     Recent Labs  Lab 02/07/24 1713 02/08/24 0505 02/09/24 0452  NA 133* 136 137  K 3.7 3.4* 4.3  CL 101 104 109  CO2 23 24 21*  GLUCOSE 92 104* 106*  BUN 21 17 15   CREATININE 1.32* 1.20* 1.04*  CALCIUM 8.4* 8.5* 8.7*  MG  --   --  2.1  DDIMER 0.96*  --   --   PROCALCITON  --  0.10  --   BNP 153.0*  --   --     ------------------------------------------------------------------------------------------------------------------ No results for input(s): "CHOL", "HDL", "LDLCALC", "TRIG", "CHOLHDL", "LDLDIRECT" in the last 72 hours.  Lab Results  Component Value Date   HGBA1C 6.1 (H) 02/16/2021   ------------------------------------------------------------------------------------------------------------------ No results for input(s): "TSH", "T4TOTAL", "T3FREE", "THYROIDAB" in the last 72 hours.  Invalid input(s): "FREET3"  Cardiac Enzymes No results for input(s): "CKMB", "TROPONINI", "MYOGLOBIN" in the last 168 hours.  Invalid input(s):  "CK" ------------------------------------------------------------------------------------------------------------------    Component Value Date/Time   BNP 153.0 (H) 02/07/2024 1713   BNP 35.9 04/02/2016 1154    CBG: No results for input(s): "GLUCAP" in the last 168 hours.  Recent Results (from the past 240 hours)  Resp panel by RT-PCR (RSV, Flu A&B, Covid) Anterior Nasal Swab     Status: None   Collection Time: 02/07/24  5:13 PM   Specimen: Anterior Nasal Swab  Result Value Ref Range Status   SARS Coronavirus 2 by RT PCR NEGATIVE NEGATIVE Final    Comment: (NOTE) SARS-CoV-2 target nucleic acids are NOT DETECTED.  The SARS-CoV-2 RNA is generally detectable in upper respiratory specimens during the acute phase of infection. The lowest concentration of SARS-CoV-2 viral copies this assay can detect is 138 copies/mL. A negative result does not preclude SARS-Cov-2 infection and should not be used as the sole basis for treatment or other patient management decisions. A negative result may occur with  improper specimen collection/handling, submission of specimen other than nasopharyngeal swab, presence of viral mutation(s) within the areas targeted by this assay, and inadequate number of viral copies(<138 copies/mL). A negative result must be combined with clinical observations, patient history, and epidemiological information. The expected result is Negative.  Fact Sheet for Patients:  BloggerCourse.com  Fact Sheet for Healthcare Providers:  SeriousBroker.it  This test is no t yet approved or cleared by the [place] FDA and  has been authorized for detection and/or diagnosis of SARS-CoV-2 by FDA under an Emergency Use Authorization (EUA). This EUA will remain  in effect (meaning this test can be used) for the duration of the COVID-19 declaration under Section 564(b)(1) of the Act, 21 U.S.C.section 360bbb-3(b)(1), unless the  authorization is terminated  or revoked sooner.       Influenza A by PCR NEGATIVE NEGATIVE Final   Influenza B by PCR NEGATIVE NEGATIVE Final    Comment: (NOTE) The Xpert Xpress SARS-CoV-2/FLU/RSV plus assay is intended as an aid in the diagnosis of influenza from Nasopharyngeal swab specimens and should not be used as a sole basis for treatment. Nasal washings and aspirates are unacceptable for Xpert Xpress SARS-CoV-2/FLU/RSV testing.  Fact Sheet for Patients: BloggerCourse.com  Fact Sheet for Healthcare Providers: SeriousBroker.it  This test is not yet approved or cleared by the [place] FDA and has been authorized for detection and/or diagnosis  of SARS-CoV-2 by FDA under an Emergency Use Authorization (EUA). This EUA will remain in effect (meaning this test can be used) for the duration of the COVID-19 declaration under Section 564(b)(1) of the Act, 21 U.S.C. section 360bbb-3(b)(1), unless the authorization is terminated or revoked.     Resp Syncytial Virus by PCR NEGATIVE NEGATIVE Final    Comment: (NOTE) Fact Sheet for Patients: BloggerCourse.com  Fact Sheet for Healthcare Providers: SeriousBroker.it  This test is not yet approved or cleared by the [place] FDA and has been authorized for detection and/or diagnosis of SARS-CoV-2 by FDA under an Emergency Use Authorization (EUA). This EUA will remain in effect (meaning this test can be used) for the duration of the COVID-19 declaration under Section 564(b)(1) of the Act, 21 U.S.C. section 360bbb-3(b)(1), unless the authorization is terminated or revoked.  Performed at [facility], [address] [place], [place] [address]      Radiology Studies: No results found.   Pamella Pert, MD, PhD [facility]  Between 7 am - 7 pm I am available, please contact me via Amion (for  emergencies) or Securechat (non urgent messages)  Between 7 pm - 7 am I am not available, please contact night coverage MD/APP via Amion

## 2024-02-09 NOTE — TOC Progression Note (Signed)
 Transition of Care [facility]) - Progression Note   Patient Details  Name: Donna Williamson MRN: [id number] Date of Birth: 03/16/1946  Transition of [facility]) CM/SW Contact  Ewing Schlein, LCSW Phone Number: 02/09/2024, 2:07 PM  Clinical Narrative: PT/OT evaluations did not recommend any follow up. Patient is currently on 2L/min oxygen. TOC to follow for possible discharge needs.  Expected Discharge Plan: Home/Self Care Barriers to Discharge: Continued Medical Work up  Expected Discharge Plan and Services In-house Referral: Clinical Social Work Post Acute Care Choice: NA Living arrangements for the past 2 months: Single Family Home           DME Arranged: N/A DME Agency: NA HH Arranged: NA HH Agency: NA  Social Determinants of Health (SDOH) Interventions SDOH Screenings   Food Insecurity: No Food Insecurity (02/08/2024)  Housing: Low Risk  (02/08/2024)  Transportation Needs: No Transportation Needs (02/08/2024)  Utilities: Not At Risk (02/08/2024)  Social Connections: Moderately Integrated (02/08/2024)  Tobacco Use: Low Risk  (02/08/2024)   Readmission Risk Interventions    02/08/2024    9:10 AM  Readmission Risk Prevention Plan  Transportation Screening Complete  PCP or Specialist Appt within 5-7 Days Complete  Home Care Screening Complete  Medication Review (RN CM) Complete

## 2024-02-10 DIAGNOSIS — J189 Pneumonia, unspecified organism: Secondary | ICD-10-CM | POA: Diagnosis not present

## 2024-02-10 LAB — CBC
HCT: 31 % — ABNORMAL LOW (ref 36.0–46.0)
Hemoglobin: 9.9 g/dL — ABNORMAL LOW (ref 12.0–15.0)
MCH: 32.4 pg (ref 26.0–34.0)
MCHC: 31.9 g/dL (ref 30.0–36.0)
MCV: 101.3 fL — ABNORMAL HIGH (ref 80.0–100.0)
Platelets: 383 10*3/uL (ref 150–400)
RBC: 3.06 MIL/uL — ABNORMAL LOW (ref 3.87–5.11)
RDW: 12.4 % (ref 11.5–15.5)
WBC: 6.3 10*3/uL (ref 4.0–10.5)
nRBC: 0 % (ref 0.0–0.2)

## 2024-02-10 LAB — COMPREHENSIVE METABOLIC PANEL WITH GFR
ALT: 30 U/L (ref 0–44)
AST: 36 U/L (ref 15–41)
Albumin: 2.8 g/dL — ABNORMAL LOW (ref 3.5–5.0)
Alkaline Phosphatase: 62 U/L (ref 38–126)
Anion gap: 7 (ref 5–15)
BUN: 20 mg/dL (ref 8–23)
CO2: 25 mmol/L (ref 22–32)
Calcium: 8.9 mg/dL (ref 8.9–10.3)
Chloride: 102 mmol/L (ref 98–111)
Creatinine, Ser: 1.04 mg/dL — ABNORMAL HIGH (ref 0.44–1.00)
GFR, Estimated: 54 mL/min — ABNORMAL LOW (ref >60)
Glucose, Bld: 121 mg/dL — ABNORMAL HIGH (ref 70–99)
Potassium: 4 mmol/L (ref 3.5–5.1)
Sodium: 134 mmol/L — ABNORMAL LOW (ref 135–145)
Total Bilirubin: 0.3 mg/dL (ref 0.0–1.2)
Total Protein: 6.4 g/dL — ABNORMAL LOW (ref 6.5–8.1)

## 2024-02-10 LAB — MAGNESIUM: Magnesium: 2.1 mg/dL (ref 1.7–2.4)

## 2024-02-10 MED ORDER — AZITHROMYCIN 250 MG PO TABS
500.0000 mg | ORAL_TABLET | Freq: Every day | ORAL | Status: DC
  Administered 2024-02-11 – 2024-02-12 (×2): 500 mg via ORAL
  Filled 2024-02-10 (×2): qty 2

## 2024-02-10 MED ORDER — ENOXAPARIN SODIUM 40 MG/0.4ML IJ SOSY
40.0000 mg | PREFILLED_SYRINGE | INTRAMUSCULAR | Status: DC
Start: 2024-02-10 — End: 2024-02-12
  Administered 2024-02-10 – 2024-02-12 (×2): 40 mg via SUBCUTANEOUS
  Filled 2024-02-10 (×2): qty 0.4

## 2024-02-10 NOTE — Plan of Care (Signed)

## 2024-02-10 NOTE — Plan of Care (Signed)
 No acute events overnight. Problem: Education: Goal: Knowledge of General Education information will improve Description: Including pain rating scale, medication(s)/side effects and non-pharmacologic comfort measures Outcome: Progressing   Problem: Health Behavior/Discharge Planning: Goal: Ability to manage health-related needs will improve Outcome: Progressing   Problem: Clinical Measurements: Goal: Ability to maintain clinical measurements within normal limits will improve Outcome: Progressing Goal: Will remain free from infection Outcome: Progressing Goal: Diagnostic test results will improve Outcome: Progressing Goal: Respiratory complications will improve Outcome: Progressing Goal: Cardiovascular complication will be avoided Outcome: Progressing   Problem: Activity: Goal: Risk for activity intolerance will decrease Outcome: Progressing   Problem: Nutrition: Goal: Adequate nutrition will be maintained Outcome: Progressing   Problem: Coping: Goal: Level of anxiety will decrease Outcome: Progressing   Problem: Elimination: Goal: Will not experience complications related to bowel motility Outcome: Progressing Goal: Will not experience complications related to urinary retention Outcome: Progressing   Problem: Pain Managment: Goal: General experience of comfort will improve and/or be controlled Outcome: Progressing   Problem: Safety: Goal: Ability to remain free from injury will improve Outcome: Progressing   Problem: Skin Integrity: Goal: Risk for impaired skin integrity will decrease Outcome: Progressing   Problem: Activity: Goal: Ability to tolerate increased activity will improve Outcome: Progressing   Problem: Clinical Measurements: Goal: Ability to maintain a body temperature in the normal range will improve Outcome: Progressing   Problem: Respiratory: Goal: Ability to maintain adequate ventilation will improve Outcome: Progressing Goal: Ability to  maintain a clear airway will improve Outcome: Progressing

## 2024-02-10 NOTE — Progress Notes (Signed)
 PHARMACIST - PHYSICIAN COMMUNICATION DR:   Aldona Amel CONCERNING: Antibiotic IV to Oral Route Change Policy  RECOMMENDATION: This patient is receiving azithromycin by the intravenous route.  Based on criteria approved by the Pharmacy and Therapeutics Committee, the antibiotic(s) is/are being converted to the equivalent oral dose form(s).   DESCRIPTION: These criteria include: Patient being treated for a respiratory tract infection, urinary tract infection, cellulitis or clostridium difficile associated diarrhea if on metronidazole The patient is not neutropenic and does not exhibit a GI malabsorption state The patient is eating (either orally or via tube) and/or has been taking other orally administered medications for a least 24 hours The patient is improving clinically and has a Tmax < 100.5  If you have questions about this conversion, please contact the Pharmacy Department  []   [phone] )  Cristine Done []   [phone] )  Arlin Benes  []   [phone] )  [facility] [x]   [phone] )  [facility]   Thank you for allowing pharmacy to be a part of this patient's care.  Alfredo Inch, PharmD, BCPS Clinical Pharmacist [place] 02/10/2024 11:26 AM

## 2024-02-10 NOTE — Progress Notes (Signed)
 Physical Therapy Treatment Patient Details Name: Donna Williamson MRN: [id number] DOB: 07/03/1946 Today's Date: 02/10/2024   History of Present Illness Donna Williamson is a 78 y.o. female presents with worsening dyspnea and hypoxia; admitted with pneumonia. PMH: CAD, prior angioplasty and stenting, COPD, GERD, HTN , Sjogren's syndrome, and HLD    PT Comments  Pt very pleasant and motivated to participate.  Pt ambulated in hallway good distance however did require supplemental oxygen.  Pt anticipates d/c home Sunday or Monday and hopeful not to go home with oxygen.  Will request mobility specialist to ambulate pt in afternoon.  SATURATION QUALIFICATIONS: (This note is used to comply with regulatory documentation for home oxygen)  Patient Saturations on Room Air at Rest = 95%  Patient Saturations on Room Air while Ambulating = 80%  Patient Saturations on 2 Liters of oxygen while Ambulating = 90%  Please briefly explain why patient needs home oxygen: to improve oxygen saturations during physical activities such as ADLs and ambulation   If plan is discharge home, recommend the following: Assistance with cooking/housework;Assist for transportation;Help with stairs or ramp for entrance   Can travel by private vehicle        Equipment Recommendations  None recommended by PT    Recommendations for Other Services       Precautions / Restrictions Precautions Precautions: Fall Precaution/Restrictions Comments: monitor sats     Mobility  Bed Mobility Overal bed mobility: Modified Independent                  Transfers Overall transfer level: Needs assistance Equipment used: Rolling walker (2 wheels) Transfers: Sit to/from Stand Sit to Stand: Supervision                Ambulation/Gait Ambulation/Gait assistance: Supervision Gait Distance (Feet): 400 Feet Assistive device: Rolling walker (2 wheels) Gait Pattern/deviations: Step-through pattern, Decreased stride  length Gait velocity: decreased     General Gait Details: utilized RW today for endurance, SPO2 dropped to 80% on room air after 100 ft so applied 2L O2 [place]; pt denies dyspnea, no increased work of breathing or distress observed; distance per pt preference with a couple standing rest breaks and cues for pursed lip breathing   Stairs             Wheelchair Mobility     Tilt Bed    Modified Rankin (Stroke Patients Only)       Balance                                            Communication Communication Communication: No apparent difficulties  Cognition Arousal: Alert Behavior During Therapy: WFL for tasks assessed/performed   PT - Cognitive impairments: No apparent impairments                         Following commands: Intact      Cueing    Exercises      General Comments        Pertinent Vitals/Pain Pain Assessment Pain Assessment: No/denies pain    Home Living                          Prior Function            PT Goals (current goals can now be found in  the care plan section) Progress towards PT goals: Progressing toward goals    Frequency    Min 3X/week      PT Plan      Co-evaluation              AM-PAC PT "6 Clicks" Mobility   Outcome Measure  Help needed turning from your back to your side while in a flat bed without using bedrails?: None Help needed moving from lying on your back to sitting on the side of a flat bed without using bedrails?: None Help needed moving to and from a bed to a chair (including a wheelchair)?: A Little Help needed standing up from a chair using your arms (e.g., wheelchair or bedside chair)?: A Little Help needed to walk in hospital room?: A Little Help needed climbing 3-5 steps with a railing? : A Little 6 Click Score: 20    End of Session Equipment Utilized During Treatment: Gait belt;Oxygen Activity Tolerance: Patient tolerated treatment well Patient  left: in chair;with call bell/phone within reach;with chair alarm set Nurse Communication: Mobility status PT Visit Diagnosis: Other abnormalities of gait and mobility (R26.89)     Time: 1610-9604 PT Time Calculation (min) (ACUTE ONLY): 24 min  Charges:    $Gait Training: 23-37 mins PT General Charges $$ ACUTE PT VISIT: 1 Visit                    Blanch Bunde, DPT Physical Therapist Acute Rehabilitation Services Office: [phone]    Myna Asal Payson 02/10/2024, 2:19 PM

## 2024-02-10 NOTE — Progress Notes (Signed)
 PROGRESS NOTE  Donna Williamson UEA:[id number] DOB: [birth year] DOA: 02/07/2024 PCP: Rodrigo Ran, MD   LOS: 3 days   Brief Narrative / Interim history: 78 year old with history of CAD prior angioplasty/PCI, COPD, GERD, HTN, Sjogren, HLD comes to the hospital with worsening hypoxia and shortness of breath.  CTA chest shows groundglass opacities and asymmetric right breast tissue/round mass.  She is admitted with concerns of bilateral community-acquired pneumonia.  Subjective / 24h Interval events: Breathing better  Assesement and Plan: Principal Problem:   Pneumonia, organism unspecified(486) Active Problems:   Essential hypertension   GERD (gastroesophageal reflux disease)   Stage 3a chronic kidney disease (HCC)   Principal problem Acute hypoxic respiratory failure, possibly ILD flare versus multifocal pneumonia-patient was started on ceftriaxone azithromycin, continue and complete a course.  Continue supportive care, COVID, flu, RSV, RVP all negative.  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 Marchelle Gearing as an outpatient.  Pulmonary consulted, appreciate input.  She was started on steroids -Continue to wean off oxygen as tolerated  Active problems Abnormal right breast tissue/mass  - Should get mammogram (or better, MRI) outpatient with PCP.  Patient aware and she will follow this up outpatient with her PCP   CAD status post PCI - Currently chest pain-free.  On aspirin, statin, Zetia, Cardizem, Imdur   GERD -  Protonix   History of Sjogren's - Supportive care   Essential hypertension - Continue Imdur, Cardizem.  IV as needed.  Blood pressure is stable   Peripheral neuropathy  - Gabapentin  Scheduled Meds:  aspirin EC  81 mg Oral Daily   [START ON 02/11/2024] azithromycin  500 mg Oral Daily   cyanocobalamin  1,000 mcg Oral Daily   dextromethorphan-guaiFENesin  1 tablet Oral BID   diltiazem  120 mg Oral Daily    enoxaparin (LOVENOX) injection  40 mg Subcutaneous Q24H   estradiol  0.5 mg Oral Daily   ezetimibe  10 mg Oral Daily   feeding supplement  237 mL Oral BID BM   FLUoxetine  20 mg Oral Daily   gabapentin  300 mg Oral QHS   ipratropium-albuterol  3 mL Nebulization TID   isosorbide mononitrate  30 mg Oral Daily   metoprolol tartrate  12.5 mg Oral BID   mometasone-formoterol  2 puff Inhalation BID   montelukast  10 mg Oral QHS   pantoprazole  80 mg Oral Daily   polyethylene glycol  17 g Oral Daily   predniSONE  40 mg Oral BID WC   rosuvastatin  20 mg Oral q1800   sodium chloride flush  3 mL Intravenous Q12H   Continuous Infusions:  cefTRIAXone (ROCEPHIN)  IV 2 g (02/09/24 2309)   PRN Meds:.acetaminophen **OR** acetaminophen, ALPRAZolam, bisacodyl, hydrALAZINE, ipratropium-albuterol, metoprolol tartrate, ondansetron **OR** ondansetron (ZOFRAN) IV, polyethylene glycol, polyvinyl alcohol, senna-docusate, traMADol, traZODone  Current Outpatient Medications  Medication Instructions   acetaminophen (TYLENOL) 500-1,000 mg, Every 6 hours PRN   albuterol (PROVENTIL) 2.5 mg, Nebulization, Every 2 hours PRN   ALPRAZolam (XANAX) 0.5 mg, Oral, Daily PRN   aspirin EC 81 mg, Oral, Daily   benzonatate (TESSALON) 200 mg, Oral, 3 times daily PRN   budesonide-formoterol (SYMBICORT) 160-4.5 MCG/ACT inhaler 2 puffs, Inhalation, 2 times daily   carboxymethylcellulose (REFRESH PLUS) 0.5 % SOLN 1 drop, Both Eyes, Daily PRN   cyanocobalamin (VITAMIN B12) 1,000 mcg, Oral, Daily   diltiazem (CARDIZEM CD) 120 mg, Oral, Daily   doxycycline (VIBRA-TABS) 100 mg,  2 times daily   EpiPen 2-Pak 0.3 mg, Subcutaneous, Daily PRN   estradiol (ESTRACE) 0.5 mg, Oral, Daily   ezetimibe (ZETIA) 10 mg, Oral, Daily at bedtime   FLUoxetine (PROZAC) 20 mg, Oral, Daily   fluticasone (FLONASE) 50 MCG/ACT nasal spray 2 sprays, Nasal, Daily   furosemide (LASIX) 20 mg, Oral, Daily   gabapentin (NEURONTIN) 300 mg, Oral, Nightly    inclisiran (LEQVIO) 284 mg, Subcutaneous,  Once   isosorbide mononitrate (IMDUR) 30 MG 24 hr tablet TAKE 1 TABLET(30 MG) BY MOUTH DAILY   metoprolol tartrate (LOPRESSOR) 25 MG tablet TAKE 1/2 TABLET(12.5 MG) BY MOUTH TWICE DAILY   montelukast (SINGULAIR) 10 mg, Oral, Daily at bedtime   Multiple Vitamin (MULTIVITAMIN WITH MINERALS) TABS tablet 1 tablet, Oral, Daily   nitroGLYCERIN (NITROSTAT) 0.4 mg, Sublingual, Every 5 min PRN   pantoprazole (PROTONIX) 40 MG tablet TAKE 2 TABLETS(80 MG) BY MOUTH DAILY   potassium chloride (KLOR-CON) 10 MEQ tablet 10 mEq, Oral, See admin instructions, Take 1 tablet (10 mEq) by mouth on Monday's and Thursday's.    predniSONE (STERAPRED UNI-PAK 21 TAB) 10 MG (21) TBPK tablet SMARTSIG:- Tablet(s) By Mouth -   PROAIR HFA 108 (90 BASE) MCG/ACT inhaler 1-2 puffs, Inhalation, Every 4 hours PRN   rosuvastatin (CRESTOR) 20 mg, Oral, Daily-1800   Vitamin D3 2,000 Units, Oral, Daily    Diet Orders (From admission, onward)     Start     Ordered   02/07/24 2245  Diet Heart Room service appropriate? Yes; Fluid consistency: Thin  Diet effective now       Question Answer Comment  Room service appropriate? Yes   Fluid consistency: Thin      02/07/24 2245            DVT prophylaxis: enoxaparin (LOVENOX) injection 40 mg Start: 02/10/24 1500 SCDs Start: 02/07/24 2245 Place TED hose Start: 02/07/24 2245   Lab Results  Component Value Date   PLT 383 02/10/2024      Code Status: Full Code  Family Communication: no family at bedside   Status is: Inpatient Remains inpatient appropriate because: severity of illness  Level of care: Med-Surg  Consultants:  none  Objective: Vitals:   02/09/24 2120 02/10/24 0418 02/10/24 0650 02/10/24 1119  BP:  122/61    Pulse: 76 66    Resp: 18 16    Temp:  97.6 F (36.4 C)    TempSrc:      SpO2: 97% 100% 94% 90%  Weight:      Height:       No intake or output data in the 24 hours ending 02/10/24 1153  Wt  Readings from Last 3 Encounters:  02/07/24 95.2 kg  10/02/23 95.3 kg  09/18/23 97.1 kg    Examination:  Constitutional: NAD Eyes: lids and conjunctivae normal, no scleral icterus ENMT: mmm Neck: normal, supple Respiratory: bibasilar rhonchi Cardiovascular: Regular rate and rhythm, no murmurs / rubs / gallops. No LE edema. Abdomen: soft, no distention, no tenderness. Bowel sounds positive.   Data Reviewed: I have independently reviewed following labs and imaging studies   CBC Recent Labs  Lab 02/07/24 1713 02/08/24 0505 02/09/24 0452 02/10/24 0710  WBC 11.8* 10.2 10.0 6.3  HGB 10.2* 10.0* 9.7* 9.9*  HCT 32.5* 31.2* 31.2* 31.0*  PLT 328 311 324 383  MCV 100.6* 101.3* 105.4* 101.3*  MCH 31.6 32.5 32.8 32.4  MCHC 31.4 32.1 31.1 31.9  RDW 12.4 12.5 12.6 12.4  LYMPHSABS 3.0  --   --   --  MONOABS 0.6  --   --   --   EOSABS 0.6*  --   --   --   BASOSABS 0.0  --   --   --     Recent Labs  Lab 02/07/24 1713 02/08/24 0505 02/09/24 0452 02/10/24 0710  NA 133* 136 137 134*  K 3.7 3.4* 4.3 4.0  CL 101 104 109 102  CO2 23 24 21* 25  GLUCOSE 92 104* 106* 121*  BUN 21 17 15 20   CREATININE 1.32* 1.20* 1.04* 1.04*  CALCIUM 8.4* 8.5* 8.7* 8.9  AST  --   --   --  36  ALT  --   --   --  30  ALKPHOS  --   --   --  62  BILITOT  --   --   --  0.3  ALBUMIN  --   --   --  2.8*  MG  --   --  2.1 2.1  DDIMER 0.96*  --   --   --   PROCALCITON  --  0.10  --   --   BNP 153.0*  --   --   --     ------------------------------------------------------------------------------------------------------------------ No results for input(s): "CHOL", "HDL", "LDLCALC", "TRIG", "CHOLHDL", "LDLDIRECT" in the last 72 hours.  Lab Results  Component Value Date   HGBA1C 6.1 (H) 02/16/2021   ------------------------------------------------------------------------------------------------------------------ No results for input(s): "TSH", "T4TOTAL", "T3FREE", "THYROIDAB" in the last 72  hours.  Invalid input(s): "FREET3"  Cardiac Enzymes No results for input(s): "CKMB", "TROPONINI", "MYOGLOBIN" in the last 168 hours.  Invalid input(s): "CK" ------------------------------------------------------------------------------------------------------------------    Component Value Date/Time   BNP 153.0 (H) 02/07/2024 1713   BNP 35.9 04/02/2016 1154    CBG: No results for input(s): "GLUCAP" in the last 168 hours.  Recent Results (from the past 240 hours)  Resp panel by RT-PCR (RSV, Flu A&B, Covid) Anterior Nasal Swab     Status: None   Collection Time: 02/07/24  5:13 PM   Specimen: Anterior Nasal Swab  Result Value Ref Range Status   SARS Coronavirus 2 by RT PCR NEGATIVE NEGATIVE Final    Comment: (NOTE) SARS-CoV-2 target nucleic acids are NOT DETECTED.  The SARS-CoV-2 RNA is generally detectable in upper respiratory specimens during the acute phase of infection. The lowest concentration of SARS-CoV-2 viral copies this assay can detect is 138 copies/mL. A negative result does not preclude SARS-Cov-2 infection and should not be used as the sole basis for treatment or other patient management decisions. A negative result may occur with  improper specimen collection/handling, submission of specimen other than nasopharyngeal swab, presence of viral mutation(s) within the areas targeted by this assay, and inadequate number of viral copies(<138 copies/mL). A negative result must be combined with clinical observations, patient history, and epidemiological information. The expected result is Negative.  Fact Sheet for Patients:  BloggerCourse.com  Fact Sheet for Healthcare Providers:  SeriousBroker.it  This test is no t yet approved or cleared by the [place]  FDA and  has been authorized for detection and/or diagnosis of SARS-CoV-2 by FDA under an Emergency Use Authorization (EUA). This EUA will remain  in effect  (meaning this test can be used) for the duration of the COVID-19 declaration under Section 564(b)(1) of the Act, 21 U.S.C.section 360bbb-3(b)(1), unless the authorization is terminated  or revoked sooner.       Influenza A by PCR NEGATIVE NEGATIVE Final   Influenza B by PCR NEGATIVE NEGATIVE Final  Comment: (NOTE) The Xpert Xpress SARS-CoV-2/FLU/RSV plus assay is intended as an aid in the diagnosis of influenza from Nasopharyngeal swab specimens and should not be used as a sole basis for treatment. Nasal washings and aspirates are unacceptable for Xpert Xpress SARS-CoV-2/FLU/RSV testing.  Fact Sheet for Patients: BloggerCourse.com  Fact Sheet for Healthcare Providers: SeriousBroker.it  This test is not yet approved or cleared by the [place]  FDA and has been authorized for detection and/or diagnosis of SARS-CoV-2 by FDA under an Emergency Use Authorization (EUA). This EUA will remain in effect (meaning this test can be used) for the duration of the COVID-19 declaration under Section 564(b)(1) of the Act, 21 U.S.C. section 360bbb-3(b)(1), unless the authorization is terminated or revoked.     Resp Syncytial Virus by PCR NEGATIVE NEGATIVE Final    Comment: (NOTE) Fact Sheet for Patients: BloggerCourse.com  Fact Sheet for Healthcare Providers: SeriousBroker.it  This test is not yet approved or cleared by the [place]  FDA and has been authorized for detection and/or diagnosis of SARS-CoV-2 by FDA under an Emergency Use Authorization (EUA). This EUA will remain in effect (meaning this test can be used) for the duration of the COVID-19 declaration under Section 564(b)(1) of the Act, 21 U.S.C. section 360bbb-3(b)(1), unless the authorization is terminated or revoked.  Performed at [facility], [address], [place], [place] [address]    Respiratory (~20 pathogens) panel by PCR     Status: None   Collection Time: 02/09/24  1:47 PM   Specimen: Nasopharyngeal Swab; Respiratory  Result Value Ref Range Status   Adenovirus NOT DETECTED NOT DETECTED Final   Coronavirus 229E NOT DETECTED NOT DETECTED Final    Comment: (NOTE) The Coronavirus on the Respiratory Panel, DOES NOT test for the novel  Coronavirus (2019 nCoV)    Coronavirus HKU1 NOT DETECTED NOT DETECTED Final   Coronavirus NL63 NOT DETECTED NOT DETECTED Final   Coronavirus OC43 NOT DETECTED NOT DETECTED Final   Metapneumovirus NOT DETECTED NOT DETECTED Final   Rhinovirus / Enterovirus NOT DETECTED NOT DETECTED Final   Influenza A NOT DETECTED NOT DETECTED Final   Influenza B NOT DETECTED NOT DETECTED Final   Parainfluenza Virus 1 NOT DETECTED NOT DETECTED Final   Parainfluenza Virus 2 NOT DETECTED NOT DETECTED Final   Parainfluenza Virus 3 NOT DETECTED NOT DETECTED Final   Parainfluenza Virus 4 NOT DETECTED NOT DETECTED Final   Respiratory Syncytial Virus NOT DETECTED NOT DETECTED Final   Bordetella pertussis NOT DETECTED NOT DETECTED Final   Bordetella Parapertussis NOT DETECTED NOT DETECTED Final   Chlamydophila pneumoniae NOT DETECTED NOT DETECTED Final   Mycoplasma pneumoniae NOT DETECTED NOT DETECTED Final    Comment: Performed at [facility], [address], [place], [place] [address]     Radiology Studies: No results found.   Kathlen Para, MD, PhD [facility]  Between 7 am - 7 pm I am available, please contact me via Amion (for emergencies) or Securechat (non urgent messages)  Between 7 pm - 7 am I am not available, please contact night coverage MD/APP via Amion

## 2024-02-10 NOTE — Progress Notes (Signed)
 Mobility Specialist - Progress Note  (2L [place]) Pre-mobility: 72 bpm HR, 97% SpO2 During mobility: 91 bpm HR, 84% SpO2 Post-mobility: 76 bpm HR, 92% SPO2   02/10/24 1443  Mobility  Activity Ambulated with assistance in hallway  Level of Assistance Standby assist, set-up cues, supervision of patient - no hands on  Assistive Device Front wheel walker  Distance Ambulated (ft) 200 ft  Range of Motion/Exercises Active  Activity Response Tolerated fair  Mobility Referral Yes  Mobility visit 1 Mobility  Mobility Specialist Start Time (ACUTE ONLY) 1400  Mobility Specialist Stop Time (ACUTE ONLY) 1420  Mobility Specialist Time Calculation (min) (ACUTE ONLY) 20 min   Pt was found in bed and agreeable to ambulate. Ambulated ~119ft with pt being talkative, then took x1 brief standing rest break due to SPO2 decreasing to 84%. Able to recover >90% within 1 min. Afterwards ambulated back to room with encouraged pursed lip breathing and slower pace, able to maintain SPO2 >89%. At EOS was left in bed with all needs met. Call bell in reach.  Lorna Rose Mobility Specialist

## 2024-02-11 DIAGNOSIS — J189 Pneumonia, unspecified organism: Secondary | ICD-10-CM | POA: Diagnosis not present

## 2024-02-11 LAB — BASIC METABOLIC PANEL WITH GFR
Anion gap: 11 (ref 5–15)
BUN: 28 mg/dL — ABNORMAL HIGH (ref 8–23)
CO2: 21 mmol/L — ABNORMAL LOW (ref 22–32)
Calcium: 9 mg/dL (ref 8.9–10.3)
Chloride: 104 mmol/L (ref 98–111)
Creatinine, Ser: 1.06 mg/dL — ABNORMAL HIGH (ref 0.44–1.00)
GFR, Estimated: 53 mL/min — ABNORMAL LOW (ref >60)
Glucose, Bld: 115 mg/dL — ABNORMAL HIGH (ref 70–99)
Potassium: 4.4 mmol/L (ref 3.5–5.1)
Sodium: 136 mmol/L (ref 135–145)

## 2024-02-11 LAB — CBC
HCT: 30.9 % — ABNORMAL LOW (ref 36.0–46.0)
Hemoglobin: 9.8 g/dL — ABNORMAL LOW (ref 12.0–15.0)
MCH: 32.5 pg (ref 26.0–34.0)
MCHC: 31.7 g/dL (ref 30.0–36.0)
MCV: 102.3 fL — ABNORMAL HIGH (ref 80.0–100.0)
Platelets: 471 10*3/uL — ABNORMAL HIGH (ref 150–400)
RBC: 3.02 MIL/uL — ABNORMAL LOW (ref 3.87–5.11)
RDW: 12.6 % (ref 11.5–15.5)
WBC: 13 10*3/uL — ABNORMAL HIGH (ref 4.0–10.5)
nRBC: 0 % (ref 0.0–0.2)

## 2024-02-11 LAB — MAGNESIUM: Magnesium: 2.2 mg/dL (ref 1.7–2.4)

## 2024-02-11 MED ORDER — IPRATROPIUM-ALBUTEROL 0.5-2.5 (3) MG/3ML IN SOLN
3.0000 mL | Freq: Two times a day (BID) | RESPIRATORY_TRACT | Status: DC
  Administered 2024-02-11 – 2024-02-12 (×2): 3 mL via RESPIRATORY_TRACT
  Filled 2024-02-11 (×2): qty 3

## 2024-02-11 NOTE — Plan of Care (Signed)
  Problem: Education: Goal: Knowledge of General Education information will improve Description: Including pain rating scale, medication(s)/side effects and non-pharmacologic comfort measures Outcome: Progressing   Problem: Clinical Measurements: Goal: Will remain free from infection Outcome: Progressing Goal: Diagnostic test results will improve Outcome: Progressing Goal: Respiratory complications will improve Outcome: Progressing   Problem: Activity: Goal: Risk for activity intolerance will decrease Outcome: Progressing   Problem: Nutrition: Goal: Adequate nutrition will be maintained Outcome: Progressing   Problem: Activity: Goal: Ability to tolerate increased activity will improve Outcome: Progressing

## 2024-02-11 NOTE — Plan of Care (Signed)

## 2024-02-11 NOTE — Progress Notes (Signed)
   02/10/24 2230  BiPAP/CPAP/SIPAP  BiPAP/CPAP/SIPAP Pt Type Adult  Reason BIPAP/CPAP not in use Non-compliant

## 2024-02-11 NOTE — Progress Notes (Addendum)
 PROGRESS NOTE  Donna Williamson MWU:[id number] DOB: 01-28-1946 DOA: 02/07/2024 PCP: Rodrigo Ran, MD   LOS: 4 days   Brief Narrative / Interim history: 78 year old with history of CAD prior angioplasty/PCI, COPD, GERD, HTN, Sjogren, HLD comes to the hospital with worsening hypoxia and shortness of breath.  CTA chest shows groundglass opacities and asymmetric right breast tissue/round mass.  She is admitted with concerns of bilateral community-acquired pneumonia.  Subjective / 24h Interval events: Continues to feel better, still hypoxic with ambulation and hoping to go home without oxygen  Assesement and Plan: Principal Problem:   Pneumonia, organism unspecified(486) Active Problems:   Essential hypertension   GERD (gastroesophageal reflux disease)   Stage 3a chronic kidney disease (HCC)   Principal problem Acute hypoxic respiratory failure, possibly ILD flare versus multifocal pneumonia-patient was started on ceftriaxone azithromycin, continue and complete a course.  Continue supportive care, COVID, flu, RSV, RVP all negative.  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 Marchelle Gearing as an outpatient.  Pulmonary consulted, appreciate input.  She was started on steroids -Will give 1 more day of steroids in the hospital with hopes that she will be able to be weaned off completely to room air, however may need home oxygen  Active problems Abnormal right breast tissue/mass  - Should get mammogram (or better, MRI) outpatient with PCP.  Patient aware and she will follow this up outpatient with her PCP   CAD status post PCI - Currently chest pain-free.  On aspirin, statin, Zetia, Cardizem, Imdur   GERD -  Protonix   History of Sjogren's - Supportive care   Essential hypertension - Continue Imdur, Cardizem.  IV as needed.  Blood pressure is stable   Peripheral neuropathy  - Gabapentin  Scheduled Meds:  aspirin EC  81 mg Oral Daily    azithromycin  500 mg Oral Daily   cyanocobalamin  1,000 mcg Oral Daily   dextromethorphan-guaiFENesin  1 tablet Oral BID   diltiazem  120 mg Oral Daily   enoxaparin (LOVENOX) injection  40 mg Subcutaneous Q24H   estradiol  0.5 mg Oral Daily   ezetimibe  10 mg Oral Daily   feeding supplement  237 mL Oral BID BM   FLUoxetine  20 mg Oral Daily   gabapentin  300 mg Oral QHS   ipratropium-albuterol  3 mL Nebulization BID   isosorbide mononitrate  30 mg Oral Daily   metoprolol tartrate  12.5 mg Oral BID   mometasone-formoterol  2 puff Inhalation BID   montelukast  10 mg Oral QHS   pantoprazole  80 mg Oral Daily   polyethylene glycol  17 g Oral Daily   predniSONE  40 mg Oral BID WC   rosuvastatin  20 mg Oral q1800   sodium chloride flush  3 mL Intravenous Q12H   Continuous Infusions:  cefTRIAXone (ROCEPHIN)  IV 2 g (02/10/24 2233)   PRN Meds:.acetaminophen **OR** acetaminophen, ALPRAZolam, bisacodyl, hydrALAZINE, ipratropium-albuterol, metoprolol tartrate, ondansetron **OR** ondansetron (ZOFRAN) IV, polyethylene glycol, polyvinyl alcohol, senna-docusate, traMADol, traZODone  Current Outpatient Medications  Medication Instructions   acetaminophen (TYLENOL) 500-1,000 mg, Every 6 hours PRN   albuterol (PROVENTIL) 2.5 mg, Nebulization, Every 2 hours PRN   ALPRAZolam (XANAX) 0.5 mg, Oral, Daily PRN   aspirin EC 81 mg, Oral, Daily   benzonatate (TESSALON) 200 mg, Oral, 3 times daily PRN   budesonide-formoterol (SYMBICORT) 160-4.5 MCG/ACT inhaler 2 puffs, Inhalation, 2 times daily   carboxymethylcellulose (REFRESH PLUS)  0.5 % SOLN 1 drop, Both Eyes, Daily PRN   cyanocobalamin (VITAMIN B12) 1,000 mcg, Oral, Daily   diltiazem (CARDIZEM CD) 120 mg, Oral, Daily   doxycycline (VIBRA-TABS) 100 mg, 2 times daily   EpiPen 2-Pak 0.3 mg, Subcutaneous, Daily PRN   estradiol (ESTRACE) 0.5 mg, Oral, Daily   ezetimibe (ZETIA) 10 mg, Oral, Daily at bedtime   FLUoxetine (PROZAC) 20 mg, Oral, Daily    fluticasone (FLONASE) 50 MCG/ACT nasal spray 2 sprays, Nasal, Daily   furosemide (LASIX) 20 mg, Oral, Daily   gabapentin (NEURONTIN) 300 mg, Oral, Nightly   inclisiran (LEQVIO) 284 mg, Subcutaneous,  Once   isosorbide mononitrate (IMDUR) 30 MG 24 hr tablet TAKE 1 TABLET(30 MG) BY MOUTH DAILY   metoprolol tartrate (LOPRESSOR) 25 MG tablet TAKE 1/2 TABLET(12.5 MG) BY MOUTH TWICE DAILY   montelukast (SINGULAIR) 10 mg, Oral, Daily at bedtime   Multiple Vitamin (MULTIVITAMIN WITH MINERALS) TABS tablet 1 tablet, Oral, Daily   nitroGLYCERIN (NITROSTAT) 0.4 mg, Sublingual, Every 5 min PRN   pantoprazole (PROTONIX) 40 MG tablet TAKE 2 TABLETS(80 MG) BY MOUTH DAILY   potassium chloride (KLOR-CON) 10 MEQ tablet 10 mEq, Oral, See admin instructions, Take 1 tablet (10 mEq) by mouth on Monday's and Thursday's.    predniSONE (STERAPRED UNI-PAK 21 TAB) 10 MG (21) TBPK tablet SMARTSIG:- Tablet(s) By Mouth -   PROAIR HFA 108 (90 BASE) MCG/ACT inhaler 1-2 puffs, Inhalation, Every 4 hours PRN   rosuvastatin (CRESTOR) 20 mg, Oral, Daily-1800   Vitamin D3 2,000 Units, Oral, Daily    Diet Orders (From admission, onward)     Start     Ordered   02/07/24 2245  Diet Heart Room service appropriate? Yes; Fluid consistency: Thin  Diet effective now       Question Answer Comment  Room service appropriate? Yes   Fluid consistency: Thin      02/07/24 2245            DVT prophylaxis: enoxaparin (LOVENOX) injection 40 mg Start: 02/10/24 1500 SCDs Start: 02/07/24 2245 Place TED hose Start: 02/07/24 2245   Lab Results  Component Value Date   PLT 471 (H) 02/11/2024      Code Status: Full Code  Family Communication: no family at bedside   Status is: Inpatient Remains inpatient appropriate because: severity of illness  Level of care: Med-Surg  Consultants:  none  Objective: Vitals:   02/10/24 1517 02/10/24 1557 02/10/24 2014 02/11/24 0438  BP:  (!) 146/70 133/74 135/66  Pulse:  78 72 75  Resp:   18 18 16   Temp:  97.7 F (36.5 C) (!) 97.5 F (36.4 C) 97.6 F (36.4 C)  TempSrc:      SpO2: 92% 94% 100% 100%  Weight:      Height:        Intake/Output Summary (Last 24 hours) at 02/11/2024 1204 Last data filed at 02/11/2024 0300 Gross per 24 hour  Intake 101.78 ml  Output --  Net 101.78 ml    Wt Readings from Last 3 Encounters:  02/07/24 95.2 kg  10/02/23 95.3 kg  09/18/23 97.1 kg    Examination:  Constitutional: NAD Eyes: lids and conjunctivae normal, no scleral icterus ENMT: mmm Neck: normal, supple Respiratory: Faint rhonchi at the bases, no wheezing Cardiovascular: Regular rate and rhythm, no murmurs / rubs / gallops. No LE edema. Abdomen: soft, no distention, no tenderness. Bowel sounds positive.   Data Reviewed: I have independently reviewed following labs and imaging studies  CBC Recent Labs  Lab 02/07/24 1713 02/08/24 0505 02/09/24 0452 02/10/24 0710 02/11/24 0713  WBC 11.8* 10.2 10.0 6.3 13.0*  HGB 10.2* 10.0* 9.7* 9.9* 9.8*  HCT 32.5* 31.2* 31.2* 31.0* 30.9*  PLT 328 311 324 383 471*  MCV 100.6* 101.3* 105.4* 101.3* 102.3*  MCH 31.6 32.5 32.8 32.4 32.5  MCHC 31.4 32.1 31.1 31.9 31.7  RDW 12.4 12.5 12.6 12.4 12.6  LYMPHSABS 3.0  --   --   --   --   MONOABS 0.6  --   --   --   --   EOSABS 0.6*  --   --   --   --   BASOSABS 0.0  --   --   --   --     Recent Labs  Lab 02/07/24 1713 02/08/24 0505 02/09/24 0452 02/10/24 0710 02/11/24 0713  NA 133* 136 137 134* 136  K 3.7 3.4* 4.3 4.0 4.4  CL 101 104 109 102 104  CO2 23 24 21* 25 21*  GLUCOSE 92 104* 106* 121* 115*  BUN 21 17 15 20  28*  CREATININE 1.32* 1.20* 1.04* 1.04* 1.06*  CALCIUM 8.4* 8.5* 8.7* 8.9 9.0  AST  --   --   --  36  --   ALT  --   --   --  30  --   ALKPHOS  --   --   --  62  --   BILITOT  --   --   --  0.3  --   ALBUMIN  --   --   --  2.8*  --   MG  --   --  2.1 2.1 2.2  DDIMER 0.96*  --   --   --   --   PROCALCITON  --  0.10  --   --   --   BNP 153.0*  --   --    --   --     ------------------------------------------------------------------------------------------------------------------ No results for input(s): "CHOL", "HDL", "LDLCALC", "TRIG", "CHOLHDL", "LDLDIRECT" in the last 72 hours.  Lab Results  Component Value Date   HGBA1C 6.1 (H) 02/16/2021   ------------------------------------------------------------------------------------------------------------------ No results for input(s): "TSH", "T4TOTAL", "T3FREE", "THYROIDAB" in the last 72 hours.  Invalid input(s): "FREET3"  Cardiac Enzymes No results for input(s): "CKMB", "TROPONINI", "MYOGLOBIN" in the last 168 hours.  Invalid input(s): "CK" ------------------------------------------------------------------------------------------------------------------    Component Value Date/Time   BNP 153.0 (H) 02/07/2024 1713   BNP 35.9 04/02/2016 1154    CBG: No results for input(s): "GLUCAP" in the last 168 hours.  Recent Results (from the past 240 hours)  Resp panel by RT-PCR (RSV, Flu A&B, Covid) Anterior Nasal Swab     Status: None   Collection Time: 02/07/24  5:13 PM   Specimen: Anterior Nasal Swab  Result Value Ref Range Status   SARS Coronavirus 2 by RT PCR NEGATIVE NEGATIVE Final    Comment: (NOTE) SARS-CoV-2 target nucleic acids are NOT DETECTED.  The SARS-CoV-2 RNA is generally detectable in upper respiratory specimens during the acute phase of infection. The lowest concentration of SARS-CoV-2 viral copies this assay can detect is 138 copies/mL. A negative result does not preclude SARS-Cov-2 infection and should not be used as the sole basis for treatment or other patient management decisions. A negative result may occur with  improper specimen collection/handling, submission of specimen other than nasopharyngeal swab, presence of viral mutation(s) within the areas targeted by this assay, and inadequate number of viral copies(<138 copies/mL).  A negative result must be  combined with clinical observations, patient history, and epidemiological information. The expected result is Negative.  Fact Sheet for Patients:  BloggerCourse.com  Fact Sheet for Healthcare Providers:  SeriousBroker.it  This test is no t yet approved or cleared by the [place]  FDA and  has been authorized for detection and/or diagnosis of SARS-CoV-2 by FDA under an Emergency Use Authorization (EUA). This EUA will remain  in effect (meaning this test can be used) for the duration of the COVID-19 declaration under Section 564(b)(1) of the Act, 21 U.S.C.section 360bbb-3(b)(1), unless the authorization is terminated  or revoked sooner.       Influenza A by PCR NEGATIVE NEGATIVE Final   Influenza B by PCR NEGATIVE NEGATIVE Final    Comment: (NOTE) The Xpert Xpress SARS-CoV-2/FLU/RSV plus assay is intended as an aid in the diagnosis of influenza from Nasopharyngeal swab specimens and should not be used as a sole basis for treatment. Nasal washings and aspirates are unacceptable for Xpert Xpress SARS-CoV-2/FLU/RSV testing.  Fact Sheet for Patients: BloggerCourse.com  Fact Sheet for Healthcare Providers: SeriousBroker.it  This test is not yet approved or cleared by the [place]  FDA and has been authorized for detection and/or diagnosis of SARS-CoV-2 by FDA under an Emergency Use Authorization (EUA). This EUA will remain in effect (meaning this test can be used) for the duration of the COVID-19 declaration under Section 564(b)(1) of the Act, 21 U.S.C. section 360bbb-3(b)(1), unless the authorization is terminated or revoked.     Resp Syncytial Virus by PCR NEGATIVE NEGATIVE Final    Comment: (NOTE) Fact Sheet for Patients: BloggerCourse.com  Fact Sheet for Healthcare Providers: SeriousBroker.it  This test is not yet  approved or cleared by the [place]  FDA and has been authorized for detection and/or diagnosis of SARS-CoV-2 by FDA under an Emergency Use Authorization (EUA). This EUA will remain in effect (meaning this test can be used) for the duration of the COVID-19 declaration under Section 564(b)(1) of the Act, 21 U.S.C. section 360bbb-3(b)(1), unless the authorization is terminated or revoked.  Performed at [facility], [address] [place], [place] [address]   Respiratory (~20 pathogens) panel by PCR     Status: None   Collection Time: 02/09/24  1:47 PM   Specimen: Nasopharyngeal Swab; Respiratory  Result Value Ref Range Status   Adenovirus NOT DETECTED NOT DETECTED Final   Coronavirus 229E NOT DETECTED NOT DETECTED Final    Comment: (NOTE) The Coronavirus on the Respiratory Panel, DOES NOT test for the novel  Coronavirus (2019 nCoV)    Coronavirus HKU1 NOT DETECTED NOT DETECTED Final   Coronavirus NL63 NOT DETECTED NOT DETECTED Final   Coronavirus OC43 NOT DETECTED NOT DETECTED Final   Metapneumovirus NOT DETECTED NOT DETECTED Final   Rhinovirus / Enterovirus NOT DETECTED NOT DETECTED Final   Influenza A NOT DETECTED NOT DETECTED Final   Influenza B NOT DETECTED NOT DETECTED Final   Parainfluenza Virus 1 NOT DETECTED NOT DETECTED Final   Parainfluenza Virus 2 NOT DETECTED NOT DETECTED Final   Parainfluenza Virus 3 NOT DETECTED NOT DETECTED Final   Parainfluenza Virus 4 NOT DETECTED NOT DETECTED Final   Respiratory Syncytial Virus NOT DETECTED NOT DETECTED Final   Bordetella pertussis NOT DETECTED NOT DETECTED Final   Bordetella Parapertussis NOT DETECTED NOT DETECTED Final   Chlamydophila pneumoniae NOT DETECTED NOT DETECTED Final   Mycoplasma pneumoniae NOT DETECTED NOT DETECTED Final    Comment: Performed at [facility]  [facility], [address] [place], [place] [address]     Radiology Studies: No results found.   Kathlen Para, MD, PhD [facility]  Between 7 am - 7 pm I am available, please contact me via Amion (for emergencies) or Securechat (non urgent messages)  Between 7 pm - 7 am I am not available, please contact night coverage MD/APP via Amion

## 2024-02-11 NOTE — Progress Notes (Signed)
 Mobility Specialist - Progress Note  Pre-mobility: 75 bpm HR, 92% SpO2 (RA) ([place] 2L) During mobility: 101 bpm HR, 86% SpO2 Post-mobility: 82 bpm HR, 93% SPO2   02/11/24 1010  Mobility  Activity Ambulated with assistance in hallway;Ambulated independently to bathroom  Level of Assistance Standby assist, set-up cues, supervision of patient - no hands on  Assistive Device Front wheel walker  Distance Ambulated (ft) 200 ft  Range of Motion/Exercises Active  Activity Response Tolerated well  Mobility Referral Yes  Mobility visit 1 Mobility  Mobility Specialist Start Time (ACUTE ONLY) 0955  Mobility Specialist Stop Time (ACUTE ONLY) 1010  Mobility Specialist Time Calculation (min) (ACUTE ONLY) 15 min   Pt was found in bed and agreeable to ambulate. Had x1 brief standing rest break due to SPO2 decreasing to 86%. Able to increase within 1 min. AT EOS returned to recliner chair with all needs met. Call bell in reach and LPN in room.  Lorna Rose Mobility Specialist

## 2024-02-11 NOTE — Progress Notes (Signed)
 Case discussed with Dr. Aldona Amel. Still hypoxemic on 2LNC. Recommend continuing 40 mg prednisone daily with bactrim MWF and keeping f/u appointment our office. Arranging for home oxygen. No objection to discharge tomorrow.   Louie Rover, MD Pulmonary and Critical Care Medicine [facility] 02/11/2024 10:23 AM Pager: see AMION  If no response to pager, please call critical care on call (see AMION) until 7pm After 7:00 pm call Elink

## 2024-02-12 ENCOUNTER — Other Ambulatory Visit: Payer: Self-pay | Admitting: *Deleted

## 2024-02-12 DIAGNOSIS — J189 Pneumonia, unspecified organism: Secondary | ICD-10-CM | POA: Diagnosis not present

## 2024-02-12 LAB — CBC
HCT: 31 % — ABNORMAL LOW (ref 36.0–46.0)
Hemoglobin: 9.5 g/dL — ABNORMAL LOW (ref 12.0–15.0)
MCH: 31.8 pg (ref 26.0–34.0)
MCHC: 30.6 g/dL (ref 30.0–36.0)
MCV: 103.7 fL — ABNORMAL HIGH (ref 80.0–100.0)
Platelets: 452 10*3/uL — ABNORMAL HIGH (ref 150–400)
RBC: 2.99 MIL/uL — ABNORMAL LOW (ref 3.87–5.11)
RDW: 12.6 % (ref 11.5–15.5)
WBC: 12.4 10*3/uL — ABNORMAL HIGH (ref 4.0–10.5)
nRBC: 0 % (ref 0.0–0.2)

## 2024-02-12 LAB — BASIC METABOLIC PANEL WITH GFR
Anion gap: 9 (ref 5–15)
BUN: 31 mg/dL — ABNORMAL HIGH (ref 8–23)
CO2: 21 mmol/L — ABNORMAL LOW (ref 22–32)
Calcium: 9.1 mg/dL (ref 8.9–10.3)
Chloride: 104 mmol/L (ref 98–111)
Creatinine, Ser: 0.81 mg/dL (ref 0.44–1.00)
GFR, Estimated: >60 mL/min (ref >60)
Glucose, Bld: 158 mg/dL — ABNORMAL HIGH (ref 70–99)
Potassium: 4.7 mmol/L (ref 3.5–5.1)
Sodium: 134 mmol/L — ABNORMAL LOW (ref 135–145)

## 2024-02-12 LAB — MAGNESIUM: Magnesium: 2.3 mg/dL (ref 1.7–2.4)

## 2024-02-12 MED ORDER — ATOVAQUONE 750 MG/5ML PO SUSP: 1500.0000 mg | Freq: Every day | ORAL | 0 refills | Status: AC

## 2024-02-12 MED ORDER — PREDNISONE 20 MG PO TABS: 40.0000 mg | ORAL_TABLET | Freq: Every day | ORAL | 0 refills | Status: DC

## 2024-02-12 MED ORDER — DM-GUAIFENESIN ER 30-600 MG PO TB12: 1.0000 | ORAL_TABLET | Freq: Two times a day (BID) | ORAL | 0 refills | Status: DC

## 2024-02-12 NOTE — Discharge Summary (Addendum)
 Physician Discharge Summary  Donna Williamson:[id number] DOB: August 18, 1946 DOA: 02/07/2024  PCP: Rodrigo Ran, MD  Admit date: 02/07/2024 Discharge date: 02/12/2024  Admitted From: home Disposition:  home  Recommendations for Outpatient Follow-up:  Follow up with PCP in 1-2 weeks Follow-up with Dr. Marchelle Gearing as scheduled  Home Health: none Equipment/Devices: home O2  Discharge Condition: stable CODE STATUS: Full code Diet Orders (From admission, onward)     Start     Ordered   02/07/24 2245  Diet Heart Room service appropriate? Yes; Fluid consistency: Thin  Diet effective now       Question Answer Comment  Room service appropriate? Yes   Fluid consistency: Thin      02/07/24 2245           Brief Narrative / Interim history: 78 year old with history of CAD prior angioplasty/PCI, COPD, GERD, HTN, Sjogren, HLD comes to the hospital with worsening hypoxia and shortness of breath.  CTA chest shows groundglass opacities and asymmetric right breast tissue/round mass.  She is admitted with concerns of bilateral community-acquired pneumonia.  Hospital Course / Discharge diagnoses: Principal Problem:   Pneumonia, organism unspecified(486) Active Problems:   Essential hypertension   GERD (gastroesophageal reflux disease)   Stage 3a chronic kidney disease (HCC)   Principal problem Acute hypoxic respiratory failure, possibly ILD flare versus multifocal pneumonia -patient was admitted to the hospital with respiratory difficulties, initially thought to be multifocal pneumonia versus possible ILD flare.  Pulmonary consulted and followed patient while hospitalized.  She was placed on steroids as well as completed a course of antibiotics with significant clinical improvement in her status.  She is now feeling better, back to baseline, however still appears to have desaturation especially with activity.  Discussed with Dr. Celine Mans with pulmonology, she will be placed on steroids until she  is seeing Dr. Marchelle Gearing next month.  In addition, she will be placed on prophylaxis, could not use Bactrim due to sulfa allergies, and start atovaquone.  A G6PD level was sent prior to discharge, pending now, in case there will be need to switch to dapsone if atovaquone is too expensive.  Active problems Abnormal right breast tissue/mass  -outpatient follow-up with PCP CAD status post PCI - Currently chest pain-free.  On aspirin, statin, Zetia, Cardizem, Imdur GERD -  Protonix History of Sjogren's - Supportive care Essential hypertension - Continue home medications  Peripheral neuropathy  - Gabapentin  Sepsis ruled out   Discharge Instructions   Allergies as of 02/12/2024       Reactions   Levofloxacin Anaphylaxis   Plavix [clopidogrel Bisulfate] Rash   Ciprofibrate    Other reaction(s): swelling, muscle aches   Codeine Nausea Only   Drug Ingredient [ticagrelor] Other (See Comments)   Patient reports hemorrhage after medical trial of brilinta in 2017   Nitrofurantoin Other (See Comments)   VISION CHANGES   Ciprofloxacin Rash   Other Reaction(s): nausea and vomiting   Penicillins Rash   Sulfa Antibiotics Rash        Medication List     STOP taking these medications    doxycycline 100 MG tablet Commonly known as: VIBRA-TABS   predniSONE 10 MG (21) Tbpk tablet Commonly known as: STERAPRED UNI-PAK 21 TAB Replaced by: predniSONE 20 MG tablet       TAKE these medications    acetaminophen 500 MG tablet Commonly known as: TYLENOL Take 500-1,000 mg by mouth every 6 (six) hours as needed for moderate pain (pain score 4-6).  ALPRAZolam 0.5 MG tablet Commonly known as: XANAX Take 0.5 mg by mouth daily as needed for anxiety.   aspirin EC 81 MG tablet Take 81 mg by mouth daily.   atovaquone 750 MG/5ML suspension Commonly known as: MEPRON Take 10 mLs (1,500 mg total) by mouth daily with breakfast.   benzonatate 200 MG capsule Commonly known as: TESSALON Take 200  mg by mouth 3 (three) times daily as needed for cough.   budesonide-formoterol 160-4.5 MCG/ACT inhaler Commonly known as: SYMBICORT Inhale 2 puffs into the lungs 2 (two) times daily.   carboxymethylcellulose 0.5 % Soln Commonly known as: REFRESH PLUS Place 1 drop into both eyes daily as needed (dry eyes).   cyanocobalamin 500 MCG tablet Commonly known as: VITAMIN B12 Take 1,000 mcg by mouth daily.   dextromethorphan-guaiFENesin 30-600 MG 12hr tablet Commonly known as: MUCINEX DM Take 1 tablet by mouth 2 (two) times daily.   diltiazem 120 MG 24 hr capsule Commonly known as: CARDIZEM CD Take 120 mg by mouth daily.   EpiPen 2-Pak 0.3 MG/0.3ML Soaj injection Generic drug: EPINEPHrine Inject 0.3 mg into the skin daily as needed (allergic reaction).   estradiol 0.5 MG tablet Commonly known as: ESTRACE Take 0.5 mg by mouth daily.   ezetimibe 10 MG tablet Commonly known as: ZETIA Take 10 mg by mouth at bedtime.   FLUoxetine 20 MG capsule Commonly known as: PROZAC Take 20 mg by mouth daily.   fluticasone 50 MCG/ACT nasal spray Commonly known as: FLONASE Place 2 sprays into the nose daily.   furosemide 20 MG tablet Commonly known as: LASIX Take 1 tablet (20 mg total) by mouth daily.   gabapentin 300 MG capsule Commonly known as: NEURONTIN Take 300 mg by mouth at bedtime.   inclisiran 284 MG/1.5ML Sosy injection Commonly known as: LEQVIO Inject 284 mg into the skin once.   isosorbide mononitrate 30 MG 24 hr tablet Commonly known as: IMDUR TAKE 1 TABLET(30 MG) BY MOUTH DAILY   metoprolol tartrate 25 MG tablet Commonly known as: LOPRESSOR TAKE 1/2 TABLET(12.5 MG) BY MOUTH TWICE DAILY   montelukast 10 MG tablet Commonly known as: SINGULAIR Take 10 mg by mouth at bedtime.   multivitamin with minerals Tabs tablet Take 1 tablet by mouth daily.   nitroGLYCERIN 0.4 MG SL tablet Commonly known as: NITROSTAT Place 1 tablet (0.4 mg total) under the tongue every 5  (five) minutes as needed for chest pain (up to 3 doses. If taking 3rd dose call 911).   pantoprazole 40 MG tablet Commonly known as: PROTONIX TAKE 2 TABLETS(80 MG) BY MOUTH DAILY   potassium chloride 10 MEQ tablet Commonly known as: KLOR-CON Take 10 mEq by mouth See admin instructions. Take 1 tablet (10 mEq) by mouth on Monday's and Thursday's.   predniSONE 20 MG tablet Commonly known as: DELTASONE Take 2 tablets (40 mg total) by mouth daily with breakfast. Replaces: predniSONE 10 MG (21) Tbpk tablet   ProAir HFA 108 (90 Base) MCG/ACT inhaler Generic drug: albuterol Inhale 1-2 puffs into the lungs every 4 (four) hours as needed for wheezing.   albuterol (2.5 MG/3ML) 0.083% nebulizer solution Commonly known as: PROVENTIL Take 3 mLs (2.5 mg total) by nebulization every 2 (two) hours as needed for wheezing or shortness of breath.   rosuvastatin 20 MG tablet Commonly known as: CRESTOR Take 1 tablet (20 mg total) by mouth daily at 6 PM.   Vitamin D3 50 MCG (2000 UT) Tabs Take 2,000 Units by mouth daily.  Durable Medical Equipment  (From admission, onward)           Start     Ordered   02/12/24 0817  For home use only DME oxygen  Once       Question Answer Comment  Length of Need 6 Months   Mode or (Route) Nasal cannula   Liters per Minute 2   Frequency Continuous (stationary and portable oxygen unit needed)   Oxygen conserving device Yes   Oxygen delivery system Gas      02/12/24 0816           Consultations: Pulmonary   Procedures/Studies:  CT Angio Chest PE W and/or Wo Contrast Result Date: 02/07/2024 CLINICAL DATA:  Pulmonary embolism (PE) suspected, low to intermediate prob, neg D-dimer Pulmonary embolism (PE) suspected, low to intermediate prob, positive D-dimer EXAM: CT ANGIOGRAPHY CHEST WITH CONTRAST TECHNIQUE: Multidetector CT imaging of the chest was performed using the standard protocol during bolus administration of intravenous  contrast. Multiplanar CT image reconstructions and MIPs were obtained to evaluate the vascular anatomy. RADIATION DOSE REDUCTION: This exam was performed according to the departmental dose-optimization program which includes automated exposure control, adjustment of the mA and/or kV according to patient size and/or use of iterative reconstruction technique. CONTRAST:  60mL OMNIPAQUE IOHEXOL 350 MG/ML SOLN COMPARISON:  CT chest 05/18/2023, chest x-ray [id number] FINDINGS: Cardiovascular: Normal heart size. No significant pericardial effusion. The thoracic aorta is normal in caliber. Moderate atherosclerotic plaque of the thoracic aorta. Four-vessel coronary artery calcifications. Aortic valve leaflet calcification. Mediastinum/Nodes: No enlarged mediastinal, hilar, or axillary lymph nodes. Thyroid gland, trachea, and esophagus demonstrate no significant findings. Lungs/Pleura: Mosaic attenuation of the lungs with diffuse patchy ground-glass airspace opacities. No pulmonary nodule. No pulmonary mass. No pleural effusion. No pneumothorax. Upper Abdomen: Colonic diverticulosis.  No acute abnormality. Musculoskeletal: Asymmetric right breast tissue with a grossly stable in size 1.3 x 1.4 cm rounded mass. No suspicious lytic or blastic osseous lesions. No acute displaced fracture. Multilevel mild degenerative changes of the spine. Review of the MIP images confirms the above findings. IMPRESSION: 1. No pulmonary embolus. 2. Patchy ground-glass airspace opacities of the lung suggestive of infection/inflammation (finding could be representative of COVID). 3. Aortic Atherosclerosis (ICD10-I70.0) including four-vessel coronary calcification and aortic valve leaflet calcification of an correlate for aortic stenosis. 4. Asymmetric right breast tissue with a grossly stable in size 1.3 x 1.4 cm rounded mass. Recommend correlation with physical exam and clinical history. If clinically indicated, please obtain outpatient diagnostic  mammography. Electronically Signed   By: Tish Frederickson M.D.   On: 02/07/2024 21:25   DG Chest 2 View Result Date: 02/07/2024 CLINICAL DATA:  Shortness of breath.  Hypoxia.  Pneumonia on x-ray. EXAM: CHEST - 2 VIEW COMPARISON:  Chest radiograph 09/26/2022 FINDINGS: Right upper lobe opacity typical of pneumonia. There is also suspected patchy left lung base opacity. Stable heart size and mediastinal contours. No pleural fluid or pneumothorax. IMPRESSION: 1. Right upper lobe opacity typical of pneumonia. 2. Suspected patchy left lung base opacity, also suspicious for pneumonia. Electronically Signed   By: Narda Rutherford M.D.   On: 02/07/2024 18:25     Subjective: - no chest pain, shortness of breath, no abdominal pain, nausea or vomiting.   Discharge Exam: BP (!) 155/68 (BP Location: Left Arm)   Pulse 70   Temp 97.8 F (36.6 C)   Resp 18   Ht 5\' 5"  (1.651 m)   Wt 95.2 kg   SpO2 99%  BMI 34.93 kg/m   General: Pt is alert, awake, not in acute distress Cardiovascular: RRR, S1/S2 +, no rubs, no gallops Respiratory: CTA bilaterally, no wheezing, no rhonchi Abdominal: Soft, NT, ND, bowel sounds + Extremities: no edema, no cyanosis    The results of significant diagnostics from this hospitalization (including imaging, microbiology, ancillary and laboratory) are listed below for reference.     Microbiology: Recent Results (from the past 240 hours)  Resp panel by RT-PCR (RSV, Flu A&B, Covid) Anterior Nasal Swab     Status: None   Collection Time: 02/07/24  5:13 PM   Specimen: Anterior Nasal Swab  Result Value Ref Range Status   SARS Coronavirus 2 by RT PCR NEGATIVE NEGATIVE Final    Comment: (NOTE) SARS-CoV-2 target nucleic acids are NOT DETECTED.  The SARS-CoV-2 RNA is generally detectable in upper respiratory specimens during the acute phase of infection. The lowest concentration of SARS-CoV-2 viral copies this assay can detect is 138 copies/mL. A negative result does not  preclude SARS-Cov-2 infection and should not be used as the sole basis for treatment or other patient management decisions. A negative result may occur with  improper specimen collection/handling, submission of specimen other than nasopharyngeal swab, presence of viral mutation(s) within the areas targeted by this assay, and inadequate number of viral copies(<138 copies/mL). A negative result must be combined with clinical observations, patient history, and epidemiological information. The expected result is Negative.  Fact Sheet for Patients:  BloggerCourse.com  Fact Sheet for Healthcare Providers:  SeriousBroker.it  This test is no t yet approved or cleared by the [place]  FDA and  has been authorized for detection and/or diagnosis of SARS-CoV-2 by FDA under an Emergency Use Authorization (EUA). This EUA will remain  in effect (meaning this test can be used) for the duration of the COVID-19 declaration under Section 564(b)(1) of the Act, 21 U.S.C.section 360bbb-3(b)(1), unless the authorization is terminated  or revoked sooner.       Influenza A by PCR NEGATIVE NEGATIVE Final   Influenza B by PCR NEGATIVE NEGATIVE Final    Comment: (NOTE) The Xpert Xpress SARS-CoV-2/FLU/RSV plus assay is intended as an aid in the diagnosis of influenza from Nasopharyngeal swab specimens and should not be used as a sole basis for treatment. Nasal washings and aspirates are unacceptable for Xpert Xpress SARS-CoV-2/FLU/RSV testing.  Fact Sheet for Patients: BloggerCourse.com  Fact Sheet for Healthcare Providers: SeriousBroker.it  This test is not yet approved or cleared by the [place]  FDA and has been authorized for detection and/or diagnosis of SARS-CoV-2 by FDA under an Emergency Use Authorization (EUA). This EUA will remain in effect (meaning this test can be used) for the  duration of the COVID-19 declaration under Section 564(b)(1) of the Act, 21 U.S.C. section 360bbb-3(b)(1), unless the authorization is terminated or revoked.     Resp Syncytial Virus by PCR NEGATIVE NEGATIVE Final    Comment: (NOTE) Fact Sheet for Patients: BloggerCourse.com  Fact Sheet for Healthcare Providers: SeriousBroker.it  This test is not yet approved or cleared by the [place]  FDA and has been authorized for detection and/or diagnosis of SARS-CoV-2 by FDA under an Emergency Use Authorization (EUA). This EUA will remain in effect (meaning this test can be used) for the duration of the COVID-19 declaration under Section 564(b)(1) of the Act, 21 U.S.C. section 360bbb-3(b)(1), unless the authorization is terminated or revoked.  Performed at [facility], [address] [place], [place] [address]   Respiratory (~20 pathogens)  panel by PCR     Status: None   Collection Time: 02/09/24  1:47 PM   Specimen: Nasopharyngeal Swab; Respiratory  Result Value Ref Range Status   Adenovirus NOT DETECTED NOT DETECTED Final   Coronavirus 229E NOT DETECTED NOT DETECTED Final    Comment: (NOTE) The Coronavirus on the Respiratory Panel, DOES NOT test for the novel  Coronavirus (2019 nCoV)    Coronavirus HKU1 NOT DETECTED NOT DETECTED Final   Coronavirus NL63 NOT DETECTED NOT DETECTED Final   Coronavirus OC43 NOT DETECTED NOT DETECTED Final   Metapneumovirus NOT DETECTED NOT DETECTED Final   Rhinovirus / Enterovirus NOT DETECTED NOT DETECTED Final   Influenza A NOT DETECTED NOT DETECTED Final   Influenza B NOT DETECTED NOT DETECTED Final   Parainfluenza Virus 1 NOT DETECTED NOT DETECTED Final   Parainfluenza Virus 2 NOT DETECTED NOT DETECTED Final   Parainfluenza Virus 3 NOT DETECTED NOT DETECTED Final   Parainfluenza Virus 4 NOT DETECTED NOT DETECTED Final   Respiratory Syncytial Virus NOT DETECTED NOT DETECTED  Final   Bordetella pertussis NOT DETECTED NOT DETECTED Final   Bordetella Parapertussis NOT DETECTED NOT DETECTED Final   Chlamydophila pneumoniae NOT DETECTED NOT DETECTED Final   Mycoplasma pneumoniae NOT DETECTED NOT DETECTED Final    Comment: Performed at [facility], [address], [place], [place] [address]     Labs: Basic Metabolic Panel: Recent Labs  Lab 02/08/24 0505 02/09/24 0452 02/10/24 0710 02/11/24 0713 02/12/24 0541  NA 136 137 134* 136 134*  K 3.4* 4.3 4.0 4.4 4.7  CL 104 109 102 104 104  CO2 24 21* 25 21* 21*  GLUCOSE 104* 106* 121* 115* 158*  BUN 17 15 20  28* 31*  CREATININE 1.20* 1.04* 1.04* 1.06* 0.81  CALCIUM 8.5* 8.7* 8.9 9.0 9.1  MG  --  2.1 2.1 2.2 2.3  PHOS  --  4.4  --   --   --    Liver Function Tests: Recent Labs  Lab 02/10/24 0710  AST 36  ALT 30  ALKPHOS 62  BILITOT 0.3  PROT 6.4*  ALBUMIN 2.8*   CBC: Recent Labs  Lab 02/07/24 1713 02/08/24 0505 02/09/24 0452 02/10/24 0710 02/11/24 0713 02/12/24 0541  WBC 11.8* 10.2 10.0 6.3 13.0* 12.4*  NEUTROABS 7.6  --   --   --   --   --   HGB 10.2* 10.0* 9.7* 9.9* 9.8* 9.5*  HCT 32.5* 31.2* 31.2* 31.0* 30.9* 31.0*  MCV 100.6* 101.3* 105.4* 101.3* 102.3* 103.7*  PLT 328 311 324 383 471* 452*   CBG: No results for input(s): "GLUCAP" in the last 168 hours. Hgb A1c No results for input(s): "HGBA1C" in the last 72 hours. Lipid Profile No results for input(s): "CHOL", "HDL", "LDLCALC", "TRIG", "CHOLHDL", "LDLDIRECT" in the last 72 hours. Thyroid function studies No results for input(s): "TSH", "T4TOTAL", "T3FREE", "THYROIDAB" in the last 72 hours.  Invalid input(s): "FREET3" Urinalysis    Component Value Date/Time   COLORURINE YELLOW 05/15/2023 2205   APPEARANCEUR HAZY (A) 05/15/2023 2205   LABSPEC 1.015 05/15/2023 2205   PHURINE 6.5 05/15/2023 2205   GLUCOSEU NEGATIVE 05/15/2023 2205   HGBUR NEGATIVE 05/15/2023 2205   BILIRUBINUR NEGATIVE 05/15/2023 2205   BILIRUBINUR neg  02/16/2015 1851   KETONESUR NEGATIVE 05/15/2023 2205   PROTEINUR TRACE (A) 05/15/2023 2205   UROBILINOGEN 0.2 02/16/2015 1851   UROBILINOGEN 0.2 11/29/2013 1552   NITRITE NEGATIVE 05/15/2023 2205   LEUKOCYTESUR MODERATE (A) 05/15/2023 2205  FURTHER DISCHARGE INSTRUCTIONS:   Get Medicines reviewed and adjusted: Please take all your medications with you for your next visit with your Primary MD   Laboratory/radiological data: Please request your Primary MD to go over all hospital tests and procedure/radiological results at the follow up, please ask your Primary MD to get all Hospital records sent to his/her office.   In some cases, they will be blood work, cultures and biopsy results pending at the time of your discharge. Please request that your primary care M.D. goes through all the records of your hospital data and follows up on these results.   Also Note the following: If you experience worsening of your admission symptoms, develop shortness of breath, life threatening emergency, suicidal or homicidal thoughts you must seek medical attention immediately by calling 911 or calling your MD immediately  if symptoms less severe.   You must read complete instructions/literature along with all the possible adverse reactions/side effects for all the Medicines you take and that have been prescribed to you. Take any new Medicines after you have completely understood and accpet all the possible adverse reactions/side effects.    Do not drive when taking Pain medications or sleeping medications (Benzodaizepines)   Do not take more than prescribed Pain, Sleep and Anxiety Medications. It is not advisable to combine anxiety,sleep and pain medications without talking with your primary care practitioner   Special Instructions: If you have smoked or chewed Tobacco  in the last 2 yrs please stop smoking, stop any regular Alcohol  and or any Recreational drug use.   Wear Seat belts while driving.    Please note: You were cared for by a hospitalist during your hospital stay. Once you are discharged, your primary care physician will handle any further medical issues. Please note that NO REFILLS for any discharge medications will be authorized once you are discharged, as it is imperative that you return to your primary care physician (or establish a relationship with a primary care physician if you do not have one) for your post hospital discharge needs so that they can reassess your need for medications and monitor your lab values.  Time coordinating discharge: 35 minutes  SIGNED:  Kathlen Para, MD, PhD 02/12/2024, 8:42 AM

## 2024-02-12 NOTE — Progress Notes (Signed)
   02/12/24 0130  BiPAP/CPAP/SIPAP  BiPAP/CPAP/SIPAP Pt Type Adult  Reason BIPAP/CPAP not in use Non-compliant

## 2024-02-12 NOTE — Care Management Important Message (Signed)
 Important Message  Patient Details IM Letter given to the Patient. Name: Donna Williamson MRN: [id number] Date of Birth: 11-03-1945   Important Message Given:  Yes - Medicare IM     Curtiss Dowdy 02/12/2024, 9:33 AM

## 2024-02-12 NOTE — TOC Transition Note (Signed)
 Transition of Care [facility]) - Discharge Note   Patient Details  Name: Donna Williamson MRN: [id number] Date of Birth: 1946/05/21  [facility] CM/SW Contact:  Amaryllis Junior, LCSW Phone Number: 02/12/2024, 2:04 PM   Clinical Narrative:    Pt medically ready to d/c home. O2 and RW ordered through [employer]. No further TOC needs.    Final next level of care: Home/Self Care Barriers to Discharge: Barriers Resolved   Patient Goals and CMS Choice Patient states their goals for this hospitalization and ongoing recovery are:: return home CMS Medicare.gov Compare Post Acute Care list provided to::  (NA) Choice offered to / list presented to : NA [place] ownership interest in [facility].provided to::  (NA)    Discharge Placement                       Discharge Plan and Services Additional resources added to the After Visit Summary for   In-house Referral: Clinical Social Work   Post Acute Care Choice: NA          DME Arranged: Otho Blitz rolling, Oxygen DME Agency: [employer] Date DME Agency Contacted: 02/12/24 Time DME Agency Contacted: 1404 Representative spoke with at DME Agency: Zula Hitch HH Arranged: NA HH Agency: NA        Social Drivers of Health (SDOH) Interventions SDOH Screenings   Food Insecurity: No Food Insecurity (02/08/2024)  Housing: Low Risk  (02/08/2024)  Transportation Needs: No Transportation Needs (02/08/2024)  Utilities: Not At Risk (02/08/2024)  Social Connections: Moderately Integrated (02/08/2024)  Tobacco Use: Low Risk  (02/08/2024)     Readmission Risk Interventions    02/08/2024    9:10 AM  Readmission Risk Prevention Plan  Transportation Screening Complete  PCP or Specialist Appt within 5-7 Days Complete  Home Care Screening Complete  Medication Review (RN CM) Complete

## 2024-02-12 NOTE — Progress Notes (Addendum)
 SATURATION QUALIFICATIONS: (This note is used to comply with regulatory documentation for home oxygen)  Patient Saturations on Room Air at Rest = 93%  Patient Saturations on Room Air while Ambulating = 84%    Please briefly explain why patient needs home oxygen: Patient will need oxygen to ambulate for home.

## 2024-02-12 NOTE — Progress Notes (Signed)
   02/10/24 2230  BiPAP/CPAP/SIPAP  BiPAP/CPAP/SIPAP Pt Type Adult  Reason BIPAP/CPAP not in use Non-compliant

## 2024-02-12 NOTE — Discharge Instructions (Addendum)
 Follow with Donna Hun, MD in 5-7 days  Please discuss with your PCP abnormal right breast imaging findings.  Please get a complete blood count and chemistry panel checked by your Primary MD at your next visit, and again as instructed by your Primary MD. Please get your medications reviewed and adjusted by your Primary MD.  Please request your Primary MD to go over all Hospital Tests and Procedure/Radiological results at the follow up, please get all Hospital records sent to your Prim MD by signing hospital release before you go home.  In some cases, there will be blood work, cultures and biopsy results pending at the time of your discharge. Please request that your primary care M.D. goes through all the records of your hospital data and follows up on these results.  If you had Pneumonia of Lung problems at the Hospital: Please get a 2 view Chest X ray done in 6-8 weeks after hospital discharge or sooner if instructed by your Primary MD.  If you have Congestive Heart Failure: Please call your Cardiologist or Primary MD anytime you have any of the following symptoms:  1) 3 pound weight gain in 24 hours or 5 pounds in 1 week  2) shortness of breath, with or without a dry hacking cough  3) swelling in the hands, feet or stomach  4) if you have to sleep on extra pillows at night in order to breathe  Follow cardiac low salt diet and 1.5 lit/day fluid restriction.  If you have diabetes Accuchecks 4 times/day, Once in AM empty stomach and then before each meal. Log in all results and show them to your primary doctor at your next visit. If any glucose reading is under 80 or above 300 call your primary MD immediately.  If you have Seizure/Convulsions/Epilepsy: Please do not drive, operate heavy machinery, participate in activities at heights or participate in high speed sports until you have seen by Primary MD or a Neurologist and advised to do so again. Per [place]  [place] statutes,  patients with seizures are not allowed to drive until they have been seizure-free for six months.  Use caution when using heavy equipment or power tools. Avoid working on ladders or at heights. Take showers instead of baths. Ensure the water temperature is not too high on the home water heater. Do not go swimming alone. Do not lock yourself in a room alone (i.e. bathroom). When caring for infants or small children, sit down when holding, feeding, or changing them to minimize risk of injury to the child in the event you have a seizure. Maintain good sleep hygiene. Avoid alcohol.   If you had Gastrointestinal Bleeding: Please ask your Primary MD to check a complete blood count within one week of discharge or at your next visit. Your endoscopic/colonoscopic biopsies that are pending at the time of discharge, will also need to followed by your Primary MD.  Get Medicines reviewed and adjusted. Please take all your medications with you for your next visit with your Primary MD  Please request your Primary MD to go over all hospital tests and procedure/radiological results at the follow up, please ask your Primary MD to get all Hospital records sent to his/her office.  If you experience worsening of your admission symptoms, develop shortness of breath, life threatening emergency, suicidal or homicidal thoughts you must seek medical attention immediately by calling 911 or calling your MD immediately  if symptoms less severe.  You must read complete instructions/literature along with all  the possible adverse reactions/side effects for all the Medicines you take and that have been prescribed to you. Take any new Medicines after you have completely understood and accpet all the possible adverse reactions/side effects.   Do not drive or operate heavy machinery when taking Pain medications.   Do not take more than prescribed Pain, Sleep and Anxiety Medications  Special Instructions: If you have smoked or chewed  Tobacco  in the last 2 yrs please stop smoking, stop any regular Alcohol  and or any Recreational drug use.  Wear Seat belts while driving.  Please note You were cared for by a hospitalist during your hospital stay. If you have any questions about your discharge medications or the care you received while you were in the hospital after you are discharged, you can call the unit and asked to speak with the hospitalist on call if the hospitalist that took care of you is not available. Once you are discharged, your primary care physician will handle any further medical issues. Please note that NO REFILLS for any discharge medications will be authorized once you are discharged, as it is imperative that you return to your primary care physician (or establish a relationship with a primary care physician if you do not have one) for your aftercare needs so that they can reassess your need for medications and monitor your lab values.  You can reach the hospitalist office at phone [phone] or fax [phone]   If you do not have a primary care physician, you can call [phone] for a physician referral.  Activity: As tolerated with Full fall precautions use walker/cane & assistance as needed    Diet: regular  Disposition Home

## 2024-02-12 NOTE — Progress Notes (Signed)
 AVS reviewed w/ pt who verbalized an understanding. O2 for home in room - waiting on RW from [employer]. PIV removed as noted. Pt dressing for d/c to home - no other questions at this time. Spouse in room.

## 2024-02-12 NOTE — Plan of Care (Signed)
  Problem: Clinical Measurements: Goal: Respiratory complications will improve Outcome: Progressing Goal: Cardiovascular complication will be avoided Outcome: Progressing   Problem: Activity: Goal: Risk for activity intolerance will decrease Outcome: Progressing   Problem: Nutrition: Goal: Adequate nutrition will be maintained Outcome: Progressing   Problem: Coping: Goal: Level of anxiety will decrease Outcome: Progressing   Problem: Elimination: Goal: Will not experience complications related to bowel motility Outcome: Progressing Goal: Will not experience complications related to urinary retention Outcome: Progressing   Problem: Pain Managment: Goal: General experience of comfort will improve and/or be controlled Outcome: Progressing   Problem: Safety: Goal: Ability to remain free from injury will improve Outcome: Progressing   Problem: Skin Integrity: Goal: Risk for impaired skin integrity will decrease Outcome: Progressing   Problem: Activity: Goal: Ability to tolerate increased activity will improve Outcome: Progressing   Problem: Clinical Measurements: Goal: Ability to maintain a body temperature in the normal range will improve Outcome: Progressing   Problem: Respiratory: Goal: Ability to maintain adequate ventilation will improve Outcome: Progressing Goal: Ability to maintain a clear airway will improve Outcome: Progressing

## 2024-02-12 NOTE — Progress Notes (Signed)
 [facility] Liaison Note  02/12/2024  Donna Williamson October 14, 1946 [id number]  Location: RN Hospital Liaison screened the patient remotely at [facility]  Insurance: [facility] HMO   Donna Williamson is a 78 y.o. female who is a Primary Care Patient of Perini, Lavonia Powers, MD  [facility]). The patient was screened for readmission hospitalization with noted high risk score for unplanned readmission risk with 1 IP in 6 months.  The patient was assessed for potential Care Management service needs for post hospital transition for care coordination. Review of patient's electronic medical record reveals patient was admitted for Pneumonia organism unspecified. Pt will discharged home with self care. Liaison spoke with pt and educated on VBCI services and offered care coordinator post hospital prevention readmission follow up calls.  Plan: [facility] Liaison will continue to follow progress and disposition to asess for post hospital community care coordination/management needs.  Referral request for community care coordination: liaison will make a referral for post hospital prevention follow up.    VBCI Care Management/Population Health does not replace or interfere with any arrangements made by the Inpatient Transition of Care team.   For questions contact:   Lilla Reichert, RN, BSN Hospital Liaison [place]   [facility], Population Health Office Hours MTWF  8:00 am-6:00 pm Direct Dial: [phone] mobile [email or web address]

## 2024-02-12 NOTE — Progress Notes (Signed)
 Physical Therapy Treatment Patient Details Name: Donna Williamson MRN: [id number] DOB: [birth year] Today's Date: 02/12/2024   History of Present Illness Donna Williamson is a 78 y.o. female presents with worsening dyspnea and hypoxia; admitted with pneumonia. PMH: CAD, prior angioplasty and stenting, COPD, GERD, HTN , Sjogren's syndrome, and HLD    PT Comments  Pt is progressing well with mobility, she ambulated 200' with RW, she had mild loss of balance posteriorly when she let go of the RW with BUEs. She reports she does not have a rollator nor RW at home. Order for rollator placed. SpO2 98% room air rest, 85% room air walking, 95% 4L walking.      If plan is discharge home, recommend the following: Assistance with cooking/housework;Assist for transportation;Help with stairs or ramp for entrance   Can travel by private vehicle        Equipment Recommendations  Rollator (4 wheels) (pt stated she does not have a walker at home)    Recommendations for Other Services       Precautions / Restrictions Precautions Precautions: Fall Recall of Precautions/Restrictions: Intact Precaution/Restrictions Comments: monitor sats Restrictions Weight Bearing Restrictions Per Provider Order: No     Mobility  Bed Mobility Overal bed mobility: Modified Independent                  Transfers Overall transfer level: Modified independent Equipment used: Rolling walker (2 wheels) Transfers: Sit to/from Stand Sit to Stand: Modified independent (Device/Increase time)                Ambulation/Gait Ambulation/Gait assistance: Supervision, Contact guard assist Gait Distance (Feet): 200 Feet Assistive device: Rolling walker (2 wheels) Gait Pattern/deviations: Step-through pattern, Decreased stride length Gait velocity: decreased     General Gait Details: steady with RW, posterior lean when she let go of the RW with BUEs (required min A to correct balance); SpO2 85% on RA walking,  95% on 4L walking; 98% on room air at rest   Stairs             Wheelchair Mobility     Tilt Bed    Modified Rankin (Stroke Patients Only)       Balance Overall balance assessment: Mild deficits observed, not formally tested                                          Communication Communication Communication: No apparent difficulties  Cognition Arousal: Alert Behavior During Therapy: WFL for tasks assessed/performed   PT - Cognitive impairments: No apparent impairments                         Following commands: Intact      Cueing    Exercises      General Comments        Pertinent Vitals/Pain Pain Assessment Pain Assessment: No/denies pain    Home Living                          Prior Function            PT Goals (current goals can now be found in the care plan section) Acute Rehab PT Goals Patient Stated Goal: return home PT Goal Formulation: With patient Time For Goal Achievement: 02/23/24 Potential to Achieve Goals: Good Progress towards PT goals: Progressing  toward goals    Frequency    Min 3X/week      PT Plan      Co-evaluation              AM-PAC PT "6 Clicks" Mobility   Outcome Measure  Help needed turning from your back to your side while in a flat bed without using bedrails?: None Help needed moving from lying on your back to sitting on the side of a flat bed without using bedrails?: None Help needed moving to and from a bed to a chair (including a wheelchair)?: None Help needed standing up from a chair using your arms (e.g., wheelchair or bedside chair)?: None Help needed to walk in hospital room?: None Help needed climbing 3-5 steps with a railing? : A Little 6 Click Score: 23    End of Session Equipment Utilized During Treatment: Gait belt;Oxygen Activity Tolerance: Patient tolerated treatment well Patient left: in chair;with call bell/phone within reach;with chair alarm  set Nurse Communication: Mobility status PT Visit Diagnosis: Other abnormalities of gait and mobility (R26.89)     Time: 0960-4540 PT Time Calculation (min) (ACUTE ONLY): 10 min  Charges:    $Gait Training: 8-22 mins PT General Charges $$ ACUTE PT VISIT: 1 Visit                    Lynann Sandman Kistler PT 02/12/2024  Acute Rehabilitation Services  Office [phone]

## 2024-02-13 ENCOUNTER — Telehealth: Payer: Self-pay | Admitting: *Deleted

## 2024-02-13 NOTE — Progress Notes (Signed)
 Complex Care Management Note  Care Guide Note 02/13/2024 Name: Donna Williamson MRN: [id number] DOB: April 07, 1946  Donna Williamson is a 78 y.o. year old female who sees Aldo Hun, MD for primary care. I reached out to Kerrie Peek by phone today to offer complex care management services.  Donna Williamson was given information about Complex Care Management services today including:   The Complex Care Management services include support from the care team which includes your Nurse Care Manager, Clinical Social Worker, or Pharmacist.  The Complex Care Management team is here to help remove barriers to the health concerns and goals most important to you. Complex Care Management services are voluntary, and the patient may decline or stop services at any time by request to their care team member.   Complex Care Management Consent Status: Patient agreed to services and verbal consent obtained.   Follow up plan:  Telephone appointment with complex care management team member scheduled for:  02/16/2024  Encounter Outcome:  Patient Scheduled  Kandis Ormond, CMA [place]  [facility], [facility] Guide Direct Dial: [phone]  Fax: [phone] Website: [email or web address]

## 2024-02-14 LAB — GLUCOSE 6 PHOSPHATE DEHYDROGENASE
G6PDH: 12.2 U/g{Hb} (ref 4.8–15.7)
Hemoglobin: 10.1 g/dL — ABNORMAL LOW (ref 11.1–15.9)

## 2024-02-15 ENCOUNTER — Telehealth: Payer: Self-pay | Admitting: Physician Assistant

## 2024-02-15 MED ORDER — ISOSORBIDE MONONITRATE ER 30 MG PO TB24: 30.0000 mg | ORAL_TABLET | Freq: Every day | ORAL | 1 refills | Status: DC

## 2024-02-15 NOTE — Telephone Encounter (Signed)
 Pt's medication was sent to pt's pharmacy as requested. Confirmation received.

## 2024-02-15 NOTE — Telephone Encounter (Signed)
*  STAT* If patient is at the pharmacy, call can be transferred to refill team.   1. Which medications need to be refilled? (please list name of each medication and dose if known)   isosorbide mononitrate (IMDUR) 30 MG 24 hr tablet     2. Would you like to learn more about the convenience, safety, & potential cost savings by using the [facility]? No    3. Are you open to using the [facility] (Type [facility].  ). No   4. Which pharmacy/location (including street and city if local pharmacy) is medication to be sent to? [id number] - [place], [place] - [address] AT [facility] [address]    5. Do they need a 30 day or 90 day supply? 90 day

## 2024-02-16 ENCOUNTER — Other Ambulatory Visit: Payer: Self-pay

## 2024-02-16 NOTE — Patient Instructions (Signed)
 Visit Information  Thank you for taking time to visit with me today. Please don't hesitate to contact me if I can be of assistance to you before our next scheduled appointment.  Our next appointment is by telephone on 03/05/24 at 11;00 am Please call the care guide team at [phone] if you need to cancel or reschedule your appointment.   Following is a copy of your care plan:   Goals Addressed             This Visit's Progress    VBCI RN Care Plan       Problems:  Chronic Disease Management support and education needs related to Asthma and admission for pneumonia 02/07/24-02/12/24  Goal: Over the next 90 days the Patient will attend all scheduled medical appointments: PCP, cardiology and pulmonology as evidenced by patient report or review of chart        demonstrate Ongoing adherence to prescribed treatment plan for Asthma and recent admission for PNA as evidenced by patient report or review of chart demonstrate Ongoing health management independence as evidenced by patient report or review of chart        take all medications exactly as prescribed and will call provider for medication related questions as evidenced by patient report and review of chart.     Interventions:   Asthma: Discussed the importance of adequate rest and management of fatigue with Asthma Screening for signs and symptoms of depression related to chronic disease state  Assessed social determinant of health barriers  Discussed pulmonary rehab. Patient reports plans to discuss with pulmonology at upcoming appointment  Patient Self-Care Activities:  Attend all scheduled provider appointments Call provider office for new concerns or questions  Perform all self care activities independently  Take medications as prescribed   Use oxygen as recommended Rest periods during the day  Plan:  Telephone follow up appointment with care management team member scheduled for:  03/05/24 at 11:00 am              Please call the Suicide and Crisis Lifeline: 988 call the [place]  National Suicide Prevention Lifeline: (337)823-4429 or TTY: (720)130-1493 TTY (660)308-2439) to talk to a trained counselor if you are experiencing a Mental Health or Behavioral Health Crisis or need someone to talk to.  Patient verbalizes understanding of instructions and care plan provided today and agrees to view in MyChart. Active MyChart status and patient understanding of how to access instructions and care plan via MyChart confirmed with patient.     Lindi Revering, RN, MSN, BSN, CCM [place]  [facility], Population Health Case Manager Phone: [phone]

## 2024-02-16 NOTE — Patient Outreach (Signed)
 Complex Care Management   Visit Note  02/16/2024  Name:  Donna Williamson MRN: [id number] DOB: 12-May-1946  Situation: Referral received for Complex Care Management related to  follow up post hospitalization for PNA.  I obtained verbal consent from Patient.  Visit completed with patient  on the phone  Background:   Past Medical History:  Diagnosis Date   Anemia    Anginal pain (HCC)    Anxiety    Arthritis    "feet, knees, back" (05/02/2016)   Asthma    CAD (coronary artery disease)    a. 04/2016: NSTEMI 95% stenosis 1st Mrg (s/p DES)   Chest pain    a. 10/2013 Exercise Myoview : Ef 65%, no ischemia.   CHF (congestive heart failure) (HCC)    Chronic bronchitis (HCC)    Chronic lower back pain    COPD (chronic obstructive pulmonary disease) (HCC)    GERD (gastroesophageal reflux disease)    History of blood transfusion 1960s   "related to my hysterectomy"   Hypercholesterolemia    Hypertension    Lower extremity edema    Mild aortic stenosis 03/07/2023   TTE 02/28/2023: EF 55-60, no RWMA, mild asymmetric LVH, NL RVSF, mild MR, mild aortic stenosis (bulky subvalvular calcification), mean AV gradient 9.6, V-max 199 cm/s, RAP 3   Mitral valve prolapse    NSTEMI (non-ST elevated myocardial infarction) (HCC) 05/01/2016   Pneumonia 05/01/2016   "saw trace of slight pneumonia/CT scan" (05/02/2016)   Seasonal allergies    Sjogren's syndrome (HCC)    Wheezing     Assessment: admit 02/07/24-02/12/24 with PNA. Patient reports, "I'm doing good". She states she is wearing O2 at 2l/[place] with activity. And keeping track of O2 sats with pulse oximetry. Per patient, sat 95% RA resting. 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 Neurological Management Strategies: Medication therapy, Adequate rest Neurological Self-Management Outcome: 5 (very good)  HEENT HEENT Symptoms Reported: No symptoms reported      Cardiovascular Cardiovascular Symptoms Reported: No symptoms reported Does patient have uncontrolled Hypertension?: No Cardiovascular Conditions: Hypertension, Heart failure Cardiovascular Management Strategies: Diet modification, Medication therapy, Routine screening, Weight management, Fluid modification, Adequate rest, Coping strategies, Activity Do You Have a Working Readable Scale?: Yes Cardiovascular Self-Management Outcome: 5 (very good)  Respiratory Respiratory Symptoms Reported: Shortness of breath Other Respiratory Symptoms: SOB with activity, but patient reports it is getting better Additional Respiratory Details: managed with medications, oxygen, rest Respiratory Conditions: Asthma, Seasonal allergies Respiratory Self-Management Outcome: 5 (very good)  Endocrine Patient reports the following symptoms related to hypoglycemia or hyperglycemia : No symptoms reported Is patient diabetic?: No    Gastrointestinal Gastrointestinal Symptoms Reported: No symptoms reported Gastrointestinal Conditions: Reflux/heartburn Gastrointestinal Management Strategies: Medication therapy Gastrointestinal Self-Management Outcome: 5 (very good)    Genitourinary Genitourinary Symptoms Reported: No symptoms reported    Integumentary Integumentary Symptoms Reported: Bruising Additional Integumentary Details: brusing to stomach, arms, legs from anticiagulant injections Skin Management Strategies: Routine screening Skin Self-Management Outcome: 5 (very good)  Musculoskeletal Musculoskelatal Symptoms Reviewed: Difficulty walking Additional  Musculoskeletal Details: reports has bad knees-ambulates with cane Musculoskeletal Conditions: Back pain (patient reports needs bilateral knee replacement. does not hurt at this time.)  Musculoskeletal Management Strategies: Adequate rest, Weight management, Medication therapy, Activity, Routine screening Musculoskeletal Self-Management Outcome: 5 (very good)      Psychosocial Psychosocial Symptoms Reported: No symptoms reported     Quality of Family Relationships: supportive Do you feel physically threatened by others?: No   There were no vitals filed for this visit.  Medications Reviewed Today     Reviewed by Naiyah Klostermann M, RN (Registered Nurse) on 02/16/24 at 1127  Med List Status: <None>   Medication Order Taking? Sig Documenting Provider Last Dose Status Informant  acetaminophen  (TYLENOL ) 500 MG tablet [id number] Yes Take 500-1,000 mg by mouth every 6 (six) hours as needed for moderate pain (pain score 4-6). [provider] Taking Active Self, Pharmacy Records  albuterol  (PROVENTIL ) (2.5 MG/3ML) 0.083% nebulizer solution [id number] Yes Take 3 mLs (2.5 mg total) by nebulization every 2 (two) hours as needed for wheezing or shortness of breath. Akula, Vijaya, MD Taking Active Self, Pharmacy Records           Med Note Suszanne Eriksson, Renae Carney   Fri Jan 10, 2020  3:15 PM)    ALPRAZolam  (XANAX ) 0.5 MG tablet [id number] Yes Take 0.5 mg by mouth daily as needed for anxiety.  [provider] Taking Active Self, Pharmacy Records           Med Note Sidney Drain, [facility]   Mon Aug 14, 2017  9:55 AM)    aspirin  EC 81 MG tablet [id number] Yes Take 81 mg by mouth daily. [provider] Taking Active Self, Pharmacy Records  atovaquone  (MEPRON ) 750 MG/5ML suspension [id number] Yes Take 10 mLs (1,500 mg total) by mouth daily with breakfast. Osborn Blaze, MD Taking Active   benzonatate  (TESSALON ) 200 MG capsule [id number] Yes Take 200 mg by mouth 3 (three) times daily as needed for  cough. [provider] Taking Active Self, Pharmacy Records  budesonide-formoterol  [facility]) 160-4.5 MCG/ACT inhaler [id number] Yes Inhale 2 puffs into the lungs 2 (two) times daily. [provider] Taking Active Self, Pharmacy Records           Med Note Verdia Glad, Ledon Pry   Thu Feb 08, 2024  9:33 AM) Was taken off, but restarted the day before yesterday.   carboxymethylcellulose (REFRESH PLUS) 0.5 % SOLN [id number] Yes Place 1 drop into both eyes daily as needed (dry eyes). [provider] Taking Active Self, Pharmacy Records  Cholecalciferol  (VITAMIN D3) 2000 UNITS TABS [id number] Yes Take 2,000 Units by mouth daily. [provider] Taking Active Self, Pharmacy Records  cyanocobalamin  500 MCG tablet [id number] Yes Take 1,000 mcg by mouth daily. [provider] Taking Active Self, Pharmacy Records  dextromethorphan -guaiFENesin  (MUCINEX  DM) 30-600 MG 12hr tablet [id number] Yes Take 1 tablet by mouth 2 (two) times daily. Gherghe, Costin M, MD Taking Active   diltiazem  (CARDIZEM  CD) 120 MG 24 hr capsule [id number] Yes Take 120 mg by mouth daily. [provider] Taking Active Self, Pharmacy Records  EPIPEN  2-PAK 0.3 MG/0.3ML SOAJ injection [id number] Yes Inject 0.3 mg into the skin daily as needed (allergic reaction).  [provider] Taking Active Self, Pharmacy Records           Med Note Zebedee Hibbs, Arvie Birkenhead   Fri Nov 29, 2013  1:48 PM)     estradiol  (ESTRACE ) 0.5 MG tablet [id number] Yes Take 0.5 mg by mouth daily. [provider] Taking Active Self, Pharmacy Records  ezetimibe  (ZETIA ) 10 MG tablet [id number] Yes Take 10 mg by mouth at bedtime. [provider] Taking Active Self, Pharmacy Records  FLUoxetine  (PROZAC ) 20 MG capsule [id number] Yes Take 20 mg by mouth daily. [provider] Taking Active Self, Pharmacy Records  fluticasone  (FLONASE ) 50 MCG/ACT nasal spray [id number] Yes Place 2 sprays into the nose daily.  [provider] Taking Active Self, Pharmacy Records  furosemide  (LASIX ) 20 MG tablet [id number] Yes Take 1 tablet (20 mg total) by mouth daily. Arnoldo Lapping, MD Taking Active Self, Pharmacy Records  gabapentin  (NEURONTIN ) 300 MG capsule [id number] Yes Take 300 mg by mouth at bedtime. [provider] Taking Active Self, Pharmacy Records  inclisiran (LEQVIO ) 284 MG/1.5ML SOSY injection [id number] Yes Inject 284 mg into the skin once. [provider] Taking Active Self, Pharmacy Records           Med Note Verdia Glad, TAYLOR H   Thu Feb 08, 2024  9:35 AM) [facility] administers every 6 months.   isosorbide  mononitrate (IMDUR ) 30 MG 24 hr tablet [id number] Yes Take 1 tablet (30 mg total) by mouth daily. Arnoldo Lapping, MD Taking Active   metoprolol  tartrate (LOPRESSOR ) 25 MG tablet [id number] Yes TAKE 1/2 TABLET(12.5 MG) BY MOUTH TWICE DAILY Arnoldo Lapping, MD Taking Active Self, Pharmacy Records  montelukast  (SINGULAIR ) 10 MG tablet [id number] Yes Take 10 mg by mouth at bedtime. [provider] Taking Active Self, Pharmacy Records  Multiple Vitamin (MULTIVITAMIN WITH MINERALS) TABS tablet [id number] Yes Take 1 tablet by mouth daily. [provider] Taking Active Self, Pharmacy Records  nitroGLYCERIN  (NITROSTAT ) 0.4 MG SL tablet [id number] Yes Place 1 tablet (0.4 mg total) under the tongue every 5 (five) minutes as needed for chest pain (up to 3 doses. If taking 3rd dose call 911). Gabino Joe, PA-C Taking Active Self, Pharmacy Records           Med Note Dolores Freud   Tue Jan 31, 2023  3:17 PM)    pantoprazole  (PROTONIX ) 40 MG tablet [id number] Yes TAKE 2 TABLETS(80 MG) BY MOUTH DAILY Arnoldo Lapping, MD Taking Active Self, Pharmacy Records  potassium chloride  (KLOR-CON ) 10 MEQ tablet [id number] Yes Take 10 mEq by mouth See admin instructions. Take 1 tablet (10 mEq) by mouth on Monday's and Thursday's. [provider] Taking Active Self, Pharmacy Records   predniSONE  (DELTASONE ) 20 MG tablet [id number] Yes Take 2 tablets (40 mg total) by mouth daily with breakfast. Osborn Blaze, MD Taking Active   PROAIR  HFA 108 (90 BASE) MCG/ACT inhaler [id number] Yes Inhale 1-2 puffs into the lungs every 4 (four) hours as needed for wheezing. [provider] Taking Active Self, Pharmacy Records           Med Note Zebedee Hibbs, Arvie Birkenhead   Fri Nov 29, 2013  1:48 PM)    rosuvastatin  (CRESTOR ) 20 MG tablet [id number] Yes Take 1 tablet (20 mg total) by mouth daily at 6 PM. Narvis Bad, PA Taking Active Self, Pharmacy Records          Recommendation:   PCP Follow-up Specialty provider follow-up per patient, primary provider appointment scheduled for 02/22/24; Cardiology appointment 02/20/24 and Pulmonology 03/15/24 as scheduled  Follow Up Plan:   Telephone follow up appointment date/time:  03/05/24  Lindi Revering, RN, MSN, BSN, CCM [place]  [facility], Population Health Case Manager Phone: [phone]

## 2024-02-19 DIAGNOSIS — N6315 Unspecified lump in the right breast, overlapping quadrants: Secondary | ICD-10-CM | POA: Diagnosis not present

## 2024-02-19 DIAGNOSIS — R92321 Mammographic fibroglandular density, right breast: Secondary | ICD-10-CM | POA: Diagnosis not present

## 2024-02-19 NOTE — Progress Notes (Signed)
 Cardiology Office Note:    Date:  02/20/2024  ID:  Donna, Williamson 1946-04-05, MRN [id number] PCP: Aldo Hun, MD  [facility] Providers Cardiologist:  Arnoldo Lapping, MD       Patient Profile:      Coronary artery disease  Intol to Brilinta  (epistaxis); Allergy to Plavix   NSTEMI 04/2016 s/p DES to OM1 Myoview  3/21: low risk [place]  01/2021 s/p 2.5 x 16 mm DES to LAD and POBA of D2  Residual CAD on cath 02/17/21: pLAD 40, pLCx 40, OM1 stent patent Effient  Rx due to allergy to Brilinta  and Plavix   Myoview  02/08/2023: Normal perfusion, EF 51 (HFpEF) heart failure with preserved ejection fraction  TTE 02/18/2021: EF 60-65  TTE 02/28/2023: EF 55-60, no RWMA, mild asymmetric LVH, NL RVSF, mild MR, mild aortic stenosis (bulky subvalvular calcification), mean AV gradient 9.6, V-max 199 cm/s, RAP 3 Mild aortic stenosis  Parox Supraventricular Tachycardia  Monitor 06/2019: NSR avg HR 67, short SVT (8-10 beats), PVCs < 1% burden  Hypertension  Hyperlipidemia  Chronic kidney disease stage III Chronic Obstructive Pulmonary Disease Pulmonary fibrosis/ILD (Interstitial Lung Disease)  Pulm: Dr. Bertrum Brodie GERD Sjogren's syndrome  Sleep apnea  Carotid US  12/2013: Normal bilateral carotid arteries          Discussed the use of AI scribe software for clinical note transcription with the patient, who gave verbal consent to proceed.  History of Present Illness Donna Williamson is a 78 y.o. female who returns for follow up of CHF, CAD, AS. She was last seen by Dr. Arlester Ladd in 08/2023. She was recently admitted 4/9-4/14 with hypoxic respiratory failure thought to be due to ILD (Interstitial Lung Disease) flare vs multifocal pneumonia. She was treated with steroids, antibiotics. She has follow up with Dr. Bertrum Brodie 04/02/24.  She is here with her husband. She feels significantly better today compared to yesterday. Oxygen therapy is currently at two liters while sitting and four liters when  walking. She has been sleeping in a recliner since returning from the hospital and has not ventured upstairs. No chest discomfort, leg swelling or episodes of passing out. She has been monitoring her sodium intake closely. She is hopeful to discontinue oxygen therapy soon.    ROS-See HPI     Studies Reviewed:         Results Labs - Chart Review 02/10/24: Alb 2.8, TP 6.4, ALT 30 02/12/24: Na 134, K 4.7, BUn 31, SCr 0.81, Hgb 10.1,   KPN - Personally reviewed and interpreted.  Total cholesterol: 87 (01/18/2024) HDL: 52 (01/18/2024) LDL: 19 (01/18/2024) Triglycerides: 81 (01/18/2024)    Risk Assessment/Calculations:         STOP-Bang Score:         Physical Exam:   VS:  BP 110/60   Pulse 70   Ht 5\' 5"  (1.651 m)   Wt 211 lb 3.2 oz (95.8 kg)   SpO2 92%   BMI 35.15 kg/m    Wt Readings from Last 3 Encounters:  02/20/24 211 lb 3.2 oz (95.8 kg)  02/07/24 209 lb 14.1 oz (95.2 kg)  10/02/23 210 lb (95.3 kg)    Constitutional:      Appearance: Healthy appearance. Not in distress.  Neck:     Vascular: JVD normal.  Pulmonary:     Breath sounds: Normal breath sounds. No wheezing. No rales.  Cardiovascular:     Normal rate. Regular rhythm.     Murmurs: There is no murmur.  Edema:    Peripheral  edema absent.  Abdominal:     Palpations: Abdomen is soft.        Assessment and Plan:   Assessment & Plan Coronary artery disease involving native coronary artery of native heart with angina pectoris (HCC) History of non-STEMI treated with DES to OM1 in 2017 and DES to LAD, balloon angioplasty to the second diagonal in April 2022.  Lexiscan  MPI in 01/2023 was low risk. She is allergic to Plavix  and intolerant to Brilinta  due to epistaxis.  She is not having chest discomfort to suggest angina. - Continue current medications: aspirin  81 mg daily, diltiazem  120 mg daily, Imdur  30 mg daily, metoprolol  tartrate 12.5 mg twice daily, nitroglycerin  as needed, Crestor  20 mg daily. Mild aortic  stenosis Mild aortic stenosis with mean gradient of 9.6 mmHg by echocardiogram April 2024.  - Order echocardiogram in 6 months to reassess aortic stenosis. Chronic heart failure with preserved ejection fraction (HCC) Echocardiogram from April 2024 showed EF 55-60%, mild LVH, mild mitral regurgitation, and mild aortic stenosis.  Functional status mainly limited by lung disease.  Volume status currently stable. Recent sodium low and BUN elevated during admission. Will repeat labs today to check renal function, sodium, potassium. - Continue Lasix  20 mg daily. - Continue potassium 10 mEq daily. - BMET today. Pulmonary fibrosis (HCC) Recent hospitalization for hypoxic respiratory failure, likely due to interstitial lung disease flare versus multifocal pneumonia. Currently on prednisone  and atovaquone . Oxygen therapy in use with 2 liters at rest and 4 liters when ambulating. She reports improvement in symptoms. -Follow-up with pulmonology as planned  Hyperlipidemia LDL goal <70 Hyperlipidemia well-controlled with current lipid panel showing LDL of 19.  - Continue Crestor  20 mg daily. - Continue Zetia  10 mg daily. - Continue Leqvio  every 6 months        Dispo:  Return in about 6 months (around 08/21/2024) for Routine Follow Up, w/ Dr. Arlester Ladd .  Signed, Marlyse Single, PA-C

## 2024-02-20 ENCOUNTER — Encounter: Payer: Self-pay | Admitting: Physician Assistant

## 2024-02-20 ENCOUNTER — Ambulatory Visit: Payer: Medicare PPO | Attending: Physician Assistant | Admitting: Physician Assistant

## 2024-02-20 VITALS — BP 110/60 | HR 70 | Ht 65.0 in | Wt 211.2 lb

## 2024-02-20 DIAGNOSIS — I5032 Chronic diastolic (congestive) heart failure: Secondary | ICD-10-CM | POA: Diagnosis not present

## 2024-02-20 DIAGNOSIS — J8410 Pulmonary fibrosis, unspecified: Secondary | ICD-10-CM | POA: Diagnosis not present

## 2024-02-20 DIAGNOSIS — I350 Nonrheumatic aortic (valve) stenosis: Secondary | ICD-10-CM

## 2024-02-20 DIAGNOSIS — I25119 Atherosclerotic heart disease of native coronary artery with unspecified angina pectoris: Secondary | ICD-10-CM | POA: Diagnosis not present

## 2024-02-20 DIAGNOSIS — E785 Hyperlipidemia, unspecified: Secondary | ICD-10-CM | POA: Diagnosis not present

## 2024-02-20 NOTE — Assessment & Plan Note (Signed)
 Recent hospitalization for hypoxic respiratory failure, likely due to interstitial lung disease flare versus multifocal pneumonia. Currently on prednisone  and atovaquone . Oxygen therapy in use with 2 liters at rest and 4 liters when ambulating. She reports improvement in symptoms. -Follow-up with pulmonology as planned

## 2024-02-20 NOTE — Assessment & Plan Note (Signed)
 Mild aortic stenosis with mean gradient of 9.6 mmHg by echocardiogram April 2024.  - Order echocardiogram in 6 months to reassess aortic stenosis.

## 2024-02-20 NOTE — Patient Instructions (Signed)
 Medication Instructions:  Your physician recommends that you continue on your current medications as directed. Please refer to the Current Medication list given to you today.  *If you need a refill on your cardiac medications before your next appointment, please call your pharmacy*  Lab Work: TODAY:  BMET  If you have labs (blood work) drawn today and your tests are completely normal, you will receive your results only by: MyChart Message (if you have MyChart) OR A paper copy in the mail If you have any lab test that is abnormal or we need to change your treatment, we will call you to review the results.  Testing/Procedures: Your physician has requested that you have an echocardiogram IN OCTOBER. Echocardiography is a painless test that uses sound waves to create images of your heart. It provides your doctor with information about the size and shape of your heart and how well your heart's chambers and valves are working. This procedure takes approximately one hour. There are no restrictions for this procedure. Please do NOT wear cologne, perfume, aftershave, or lotions (deodorant is allowed). Please arrive 15 minutes prior to your appointment time.  Please note: We ask at that you not bring children with you during ultrasound (echo/ vascular) testing. Due to room size and safety concerns, children are not allowed in the ultrasound rooms during exams. Our front office staff cannot provide observation of children in our lobby area while testing is being conducted. An adult accompanying a patient to their appointment will only be allowed in the ultrasound room at the discretion of the ultrasound technician under special circumstances. We apologize for any inconvenience.   Follow-Up: At [facility], you and your health needs are our priority.  As part of our continuing mission to provide you with exceptional heart care, our providers are all part of one team.  This team includes your  primary Cardiologist (physician) and Advanced Practice Providers or APPs (Physician Assistants and Nurse Practitioners) who all work together to provide you with the care you need, when you need it.  Your next appointment:   6 month(s) 1 WEEK AFTER ECHOCARDIOGRAM  Provider:   Arnoldo Lapping, MD     We recommend signing up for the patient portal called "MyChart".  Sign up information is provided on this After Visit Summary.  MyChart is used to connect with patients for Virtual Visits (Telemedicine).  Patients are able to view lab/test results, encounter notes, upcoming appointments, etc.  Non-urgent messages can be sent to your provider as well.   To learn more about what you can do with MyChart, go to [email or web address].   Other Instructions       1st Floor: - Lobby - Registration  - Pharmacy  - Lab - Cafe  2nd Floor: - PV Lab - Diagnostic Testing (echo, CT, nuclear med)  3rd Floor: - Vacant  4th Floor: - TCTS (cardiothoracic surgery) - AFib Clinic - Structural Heart Clinic - Vascular Surgery  - Vascular Ultrasound  5th Floor: - HeartCare Cardiology (general and EP) - Clinical Pharmacy for coumadin, hypertension, lipid, weight-loss medications, and med management appointments    Valet parking services will be available as well.

## 2024-02-20 NOTE — Assessment & Plan Note (Signed)
 Echocardiogram from April 2024 showed EF 55-60%, mild LVH, mild mitral regurgitation, and mild aortic stenosis.  Functional status mainly limited by lung disease.  Volume status currently stable. Recent sodium low and BUN elevated during admission. Will repeat labs today to check renal function, sodium, potassium. - Continue Lasix  20 mg daily. - Continue potassium 10 mEq daily. - BMET today.

## 2024-02-20 NOTE — Assessment & Plan Note (Signed)
 Hyperlipidemia well-controlled with current lipid panel showing LDL of 19.  - Continue Crestor  20 mg daily. - Continue Zetia  10 mg daily. - Continue Leqvio  every 6 months

## 2024-02-20 NOTE — Assessment & Plan Note (Signed)
 History of non-STEMI treated with DES to OM1 in 2017 and DES to LAD, balloon angioplasty to the second diagonal in April 2022.  Lexiscan  MPI in 01/2023 was low risk. She is allergic to Plavix  and intolerant to Brilinta  due to epistaxis.  She is not having chest discomfort to suggest angina. - Continue current medications: aspirin  81 mg daily, diltiazem  120 mg daily, Imdur  30 mg daily, metoprolol  tartrate 12.5 mg twice daily, nitroglycerin  as needed, Crestor  20 mg daily.

## 2024-02-22 DIAGNOSIS — J849 Interstitial pulmonary disease, unspecified: Secondary | ICD-10-CM | POA: Diagnosis not present

## 2024-02-22 DIAGNOSIS — J159 Unspecified bacterial pneumonia: Secondary | ICD-10-CM | POA: Diagnosis not present

## 2024-02-22 DIAGNOSIS — I5032 Chronic diastolic (congestive) heart failure: Secondary | ICD-10-CM | POA: Diagnosis not present

## 2024-02-22 DIAGNOSIS — J9621 Acute and chronic respiratory failure with hypoxia: Secondary | ICD-10-CM | POA: Diagnosis not present

## 2024-02-22 DIAGNOSIS — M3500 Sicca syndrome, unspecified: Secondary | ICD-10-CM | POA: Diagnosis not present

## 2024-02-22 DIAGNOSIS — N6459 Other signs and symptoms in breast: Secondary | ICD-10-CM | POA: Diagnosis not present

## 2024-03-01 DIAGNOSIS — J8410 Pulmonary fibrosis, unspecified: Secondary | ICD-10-CM | POA: Diagnosis not present

## 2024-03-01 DIAGNOSIS — I129 Hypertensive chronic kidney disease with stage 1 through stage 4 chronic kidney disease, or unspecified chronic kidney disease: Secondary | ICD-10-CM | POA: Diagnosis not present

## 2024-03-01 DIAGNOSIS — T148XXA Other injury of unspecified body region, initial encounter: Secondary | ICD-10-CM | POA: Diagnosis not present

## 2024-03-01 DIAGNOSIS — R2681 Unsteadiness on feet: Secondary | ICD-10-CM | POA: Diagnosis not present

## 2024-03-01 DIAGNOSIS — J849 Interstitial pulmonary disease, unspecified: Secondary | ICD-10-CM | POA: Diagnosis not present

## 2024-03-01 DIAGNOSIS — J45909 Unspecified asthma, uncomplicated: Secondary | ICD-10-CM | POA: Diagnosis not present

## 2024-03-01 DIAGNOSIS — W182XXA Fall in (into) shower or empty bathtub, initial encounter: Secondary | ICD-10-CM | POA: Diagnosis not present

## 2024-03-05 ENCOUNTER — Other Ambulatory Visit: Payer: Self-pay

## 2024-03-05 NOTE — Patient Instructions (Signed)
 Visit Information  Thank you for taking time to visit with me today. Please don't hesitate to contact me if I can be of assistance to you before our next scheduled appointment.  Your next care management appointment is by telephone on 04/18/24 at 1:00 pm  Please call the care guide team at [phone] if you need to cancel, schedule, or reschedule an appointment.   Please call the Suicide and Crisis Lifeline: 988 call the [place]  National Suicide Prevention Lifeline: 478-808-5164 or TTY: 270-566-1291 TTY 418-413-7285) to talk to a trained counselor if you are experiencing a Mental Health or Behavioral Health Crisis or need someone to talk to.  Lindi Revering, RN, MSN, BSN, CCM [place]  [facility], Population Health Case Manager Phone: [phone]

## 2024-03-05 NOTE — Patient Outreach (Signed)
 Complex Care Management   Visit Note  03/05/2024  Name:  Donna Williamson MRN: [id number] DOB: 02-Dec-1945  Situation: Referral received for Complex Care Management related to  asthma  I obtained verbal consent from Patient.  Visit completed with patient  on the phone  Background:   Past Medical History:  Diagnosis Date   Anemia    Anginal pain (HCC)    Anxiety    Arthritis    "feet, knees, back" (05/02/2016)   Asthma    CAD (coronary artery disease)    a. 04/2016: NSTEMI 95% stenosis 1st Mrg (s/p DES)   Chest pain    a. 10/2013 Exercise Myoview : Ef 65%, no ischemia.   CHF (congestive heart failure) (HCC)    Chronic bronchitis (HCC)    Chronic lower back pain    COPD (chronic obstructive pulmonary disease) (HCC)    GERD (gastroesophageal reflux disease)    History of blood transfusion 1960s   "related to my hysterectomy"   Hypercholesterolemia    Hypertension    Lower extremity edema    Mild aortic stenosis 03/07/2023   TTE 02/28/2023: EF 55-60, no RWMA, mild asymmetric LVH, NL RVSF, mild MR, mild aortic stenosis (bulky subvalvular calcification), mean AV gradient 9.6, V-max 199 cm/s, RAP 3   Mitral valve prolapse    NSTEMI (non-ST elevated myocardial infarction) (HCC) 05/01/2016   Pneumonia 05/01/2016   "saw trace of slight pneumonia/CT scan" (05/02/2016)   Seasonal allergies    Sjogren's syndrome (HCC)    Wheezing     Assessment: Patient reports, "I am doing good". Patient with no specific complaints at this time.  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 She continues to wear oxygen as prescribed-reports O2 sat 98% on RA at rest, and she reports O2 increased to 4 L with activity due to decrease saturation. She reports upcoming appointment with pulmonologist on 04/02/24, and states she will discuss pulmonary rehab with pulmonologist at that time. Additional Respiratory Details: Patient reports she is expecting a portable concentrator to be delivered 03/06/24. Respiratory Conditions: Asthma, Seasonal allergies Respiratory Self-Management Outcome: 5 (very good)  Endocrine Is patient diabetic?: No    Gastrointestinal Gastrointestinal Symptoms Reported: No symptoms reported      Genitourinary Genitourinary Symptoms Reported: No symptoms reported    Integumentary Integumentary Symptoms Reported: Bruising Additional Integumentary Details: bruising to shoulder blade to back post fall that is improving, per paitent red spots on arms L > R due to blood thinner taken while in hospital    Musculoskeletal Musculoskelatal Symptoms Reviewed: No symptoms reported Additional Musculoskeletal Details: improved since initiation of prednisone         Psychosocial Psychosocial Symptoms Reported: No symptoms reported            09/26/2016    5:03 PM  Depression screen PHQ 2/9  Decreased Interest 0  Down, Depressed, Hopeless 0  PHQ - 2 Score 0    Vitals:   03/05/24 1144  BP: 120/68  SpO2:  96%    Medications Reviewed Today     Reviewed by Nehemie Casserly M, RN (Registered Nurse) on 03/05/24 at 1147  Med List Status: <None>   Medication Order Taking? Sig Documenting Provider Last Dose Status Informant  acetaminophen  (TYLENOL ) 500 MG tablet [id number] Yes Take 500-1,000 mg by mouth every 6 (six) hours as needed for moderate pain (pain score 4-6). [provider] Taking Active Self, Pharmacy Records  albuterol  (PROVENTIL ) (2.5 MG/3ML) 0.083% nebulizer solution [id number] Yes Take 3 mLs (2.5 mg total) by nebulization every 2 (two) hours as needed for wheezing or shortness of breath. Akula, Vijaya, MD Taking Active Self, Pharmacy Records           Med Note Suszanne Eriksson, Renae Carney   Fri Jan 10, 2020  3:15 PM)    ALPRAZolam  (XANAX ) 0.5 MG tablet [id number] Yes Take 0.5 mg by mouth daily as needed for anxiety.  [provider] Taking Active Self, Pharmacy Records           Med Note Sidney Drain, [facility] A   Mon Aug 14, 2017  9:55 AM)    aspirin  EC 81 MG tablet [id number] Yes Take 81 mg by mouth daily. [provider] Taking Active Self, Pharmacy Records  atovaquone  (MEPRON ) 750 MG/5ML suspension [id number] Yes Take 10 mLs (1,500 mg total) by mouth daily with breakfast. Osborn Blaze, MD Taking Active   benzonatate  (TESSALON ) 200 MG capsule [id number] Yes Take 200 mg by mouth 3 (three) times daily as needed for cough. [provider] Taking Active Self, Pharmacy Records  budesonide-formoterol  [facility]) 160-4.5 MCG/ACT inhaler [id number] Yes Inhale 2 puffs into the lungs 2 (two) times daily. [provider] Taking Active Self, Pharmacy Records           Med Note Verdia Glad, Ledon Pry   Thu Feb 08, 2024  9:33 AM) Was taken off, but restarted the day before yesterday.   carboxymethylcellulose (REFRESH PLUS) 0.5 % SOLN [id number] Yes Place 1 drop into both eyes daily as needed (dry eyes). [provider] Taking Active Self, Pharmacy Records  Cholecalciferol  (VITAMIN D3) 2000 UNITS TABS [id number] Yes Take 2,000 Units by mouth daily. [provider] Taking Active Self, Pharmacy Records  cyanocobalamin  500 MCG tablet [id number] Yes Take 1,000 mcg by mouth daily. [provider] Taking Active Self, Pharmacy Records  dextromethorphan -guaiFENesin  (MUCINEX  DM) 30-600 MG 12hr tablet [id number] No Take 1 tablet by mouth 2  (two) times daily.  Patient not taking: Reported on 03/05/2024   Gherghe, Costin M, MD Not Taking Active   diltiazem  (CARDIZEM  CD) 120 MG 24 hr capsule [id number] Yes Take 120 mg by mouth daily. [provider] Taking Active Self, Pharmacy Records  EPIPEN  2-PAK 0.3 MG/0.3ML SOAJ injection [id number] Yes Inject 0.3 mg into the skin daily as needed (allergic reaction).  [provider] Taking Active Self, Pharmacy Records           Med Note Zebedee Hibbs, Arvie Birkenhead   Fri Nov 29, 2013  1:48 PM)     estradiol  (ESTRACE ) 0.5 MG tablet [id number] Yes Take 0.5 mg by mouth daily. [provider] Taking Active Self, Pharmacy Records  ezetimibe  (ZETIA ) 10 MG tablet [id number] Yes Take 10 mg by mouth at bedtime. [provider] Taking Active Self, Pharmacy Records  FLUoxetine  (PROZAC ) 20 MG capsule [id number] Yes Take 20 mg by mouth daily. [provider] Taking Active Self, Pharmacy Records  fluticasone  (FLONASE ) 50 MCG/ACT nasal spray [id number] Yes Place 2 sprays into the nose  daily. [provider] Taking Active Self, Pharmacy Records  furosemide  (LASIX ) 20 MG tablet [id number] Yes Take 1 tablet (20 mg total) by mouth daily. Arnoldo Lapping, MD Taking Active Self, Pharmacy Records  gabapentin  (NEURONTIN ) 300 MG capsule [id number] Yes Take 300 mg by mouth at bedtime. [provider] Taking Active Self, Pharmacy Records  inclisiran (LEQVIO ) 284 MG/1.5ML SOSY injection [id number] Yes Inject 284 mg into the skin once. [provider] Taking Active Self, Pharmacy Records           Med Note Verdia Glad, TAYLOR H   Thu Feb 08, 2024  9:35 AM) [facility] administers every 6 months.   isosorbide  mononitrate (IMDUR ) 30 MG 24 hr tablet [id number] Yes Take 1 tablet (30 mg total) by mouth daily. Arnoldo Lapping, MD Taking Active   metoprolol  tartrate (LOPRESSOR ) 25 MG tablet [id number] Yes TAKE 1/2 TABLET(12.5 MG) BY MOUTH TWICE DAILY Arnoldo Lapping, MD Taking Active Self,  Pharmacy Records  montelukast  (SINGULAIR ) 10 MG tablet [id number] Yes Take 10 mg by mouth at bedtime. [provider] Taking Active Self, Pharmacy Records  Multiple Vitamin (MULTIVITAMIN WITH MINERALS) TABS tablet [id number] Yes Take 1 tablet by mouth daily. [provider] Taking Active Self, Pharmacy Records  nitroGLYCERIN  (NITROSTAT ) 0.4 MG SL tablet [id number] Yes Place 1 tablet (0.4 mg total) under the tongue every 5 (five) minutes as needed for chest pain (up to 3 doses. If taking 3rd dose call 911). Gabino Joe, PA-C Taking Active Self, Pharmacy Records           Med Note Dolores Freud   Tue Jan 31, 2023  3:17 PM)    pantoprazole  (PROTONIX ) 40 MG tablet [id number] Yes TAKE 2 TABLETS(80 MG) BY MOUTH DAILY Arnoldo Lapping, MD Taking Active Self, Pharmacy Records  potassium chloride  (KLOR-CON ) 10 MEQ tablet [id number] Yes Take 10 mEq by mouth See admin instructions. Take 1 tablet (10 mEq) by mouth on Monday's and Thursday's. [provider] Taking Active Self, Pharmacy Records  predniSONE  (DELTASONE ) 20 MG tablet [id number] Yes Take 2 tablets (40 mg total) by mouth daily with breakfast. Osborn Blaze, MD Taking Active   PROAIR  HFA 108 (90 BASE) MCG/ACT inhaler [id number] Yes Inhale 1-2 puffs into the lungs every 4 (four) hours as needed for wheezing. [provider] Taking Active Self, Pharmacy Records           Med Note Zebedee Hibbs, Arvie Birkenhead   Fri Nov 29, 2013  1:48 PM)    rosuvastatin  (CRESTOR ) 20 MG tablet [id number] Yes Take 1 tablet (20 mg total) by mouth daily at 6 PM. Narvis Bad, PA Taking Active Self, Pharmacy Records          Recommendation:   Patient to follow up with providers as scheduled  Follow Up Plan:   Telephone follow-up next month  Lindi Revering, RN, MSN, BSN, CCM [place]  [facility], Population Health Case Manager Phone: [phone]

## 2024-03-13 DIAGNOSIS — J189 Pneumonia, unspecified organism: Secondary | ICD-10-CM | POA: Diagnosis not present

## 2024-03-14 ENCOUNTER — Other Ambulatory Visit: Payer: Self-pay | Admitting: Cardiovascular Disease

## 2024-03-15 ENCOUNTER — Ambulatory Visit: Admitting: Internal Medicine

## 2024-03-15 DIAGNOSIS — R0609 Other forms of dyspnea: Secondary | ICD-10-CM | POA: Diagnosis not present

## 2024-03-15 DIAGNOSIS — M359 Systemic involvement of connective tissue, unspecified: Secondary | ICD-10-CM

## 2024-03-15 LAB — PULMONARY FUNCTION TEST
DL/VA % pred: 79 %
DL/VA: 3.19 ml/min/mmHg/L
DLCO cor % pred: 63 %
DLCO cor: 12.45 ml/min/mmHg
DLCO unc % pred: 55 %
DLCO unc: 10.97 ml/min/mmHg
FEF 25-75 Pre: 1.67 L/s
FEF2575-%Pred-Pre: 111 %
FEV1-%Pred-Pre: 91 %
FEV1-Pre: 1.9 L
FEV1FVC-%Pred-Pre: 105 %
FEV6-%Pred-Pre: 92 %
FEV6-Pre: 2.44 L
FEV6FVC-%Pred-Pre: 105 %
FVC-%Pred-Pre: 87 %
FVC-Pre: 2.45 L
Pre FEV1/FVC ratio: 78 %
Pre FEV6/FVC Ratio: 100 %

## 2024-03-15 NOTE — Patient Instructions (Signed)
Spiro and DLCO performed today. 

## 2024-03-15 NOTE — Progress Notes (Signed)
Spiro and DLCO performed today. 

## 2024-03-21 ENCOUNTER — Other Ambulatory Visit: Payer: Self-pay | Admitting: Cardiovascular Disease

## 2024-04-02 ENCOUNTER — Ambulatory Visit: Admitting: Internal Medicine

## 2024-04-02 ENCOUNTER — Encounter: Payer: Self-pay | Admitting: Internal Medicine

## 2024-04-02 VITALS — BP 88/54 | HR 82 | Ht 65.0 in | Wt 217.1 lb

## 2024-04-02 DIAGNOSIS — I959 Hypotension, unspecified: Secondary | ICD-10-CM | POA: Diagnosis not present

## 2024-04-02 DIAGNOSIS — J8489 Other specified interstitial pulmonary diseases: Secondary | ICD-10-CM | POA: Diagnosis not present

## 2024-04-02 DIAGNOSIS — Z8709 Personal history of other diseases of the respiratory system: Secondary | ICD-10-CM

## 2024-04-02 DIAGNOSIS — R7989 Other specified abnormal findings of blood chemistry: Secondary | ICD-10-CM

## 2024-04-02 DIAGNOSIS — Z09 Encounter for follow-up examination after completed treatment for conditions other than malignant neoplasm: Secondary | ICD-10-CM | POA: Diagnosis not present

## 2024-04-02 DIAGNOSIS — Z862 Personal history of diseases of the blood and blood-forming organs and certain disorders involving the immune mechanism: Secondary | ICD-10-CM | POA: Diagnosis not present

## 2024-04-02 DIAGNOSIS — M359 Systemic involvement of connective tissue, unspecified: Secondary | ICD-10-CM

## 2024-04-02 DIAGNOSIS — M3502 Sicca syndrome with lung involvement: Secondary | ICD-10-CM | POA: Diagnosis not present

## 2024-04-02 DIAGNOSIS — Z8679 Personal history of other diseases of the circulatory system: Secondary | ICD-10-CM | POA: Diagnosis not present

## 2024-04-02 NOTE — Progress Notes (Signed)
 OV 07/27/2023  Subjective:  Patient ID: Donna Williamson, female , DOB: [birth year] , age 78 y.o. , MRN: [id number] , ADDRESS: [address] [name] [address] PCP Donna Hun, MD Patient Care Team: Donna Hun, MD as PCP - General (Internal Medicine) Donna Lapping, MD as PCP - Cardiology (Cardiology)  This Provider for this visit: Treatment Team:  Attending Provider: Diamond Formica, MD    07/27/2023 -   Chief Complaint  Patient presents with   Pulmonary Consult    Referred by Dr. Aldo Williamson. Pt c/o DOE over many years, worse x 2 years. She gets winded "most anything I do".  She has cough in the am- prod with white to yellow sputum, then dry cough throughout the day.      HPI Donna Williamson 78 y.o. -presents with her husband.  Husband is an independent historian.  The concern is for interstitial lung disease.  Is a 78 year old female suffers from obesity.  She says in 2017 she had a myocardial infarction.  I visualized the CT scan of the chest from the time she did have pleural effusion it was a angiogram protocol to rule out pulmonary embolism which she did rule out.  She had pleural effusions and bilateral lower lobe inflammation at that time..  She states after that she felt completely like a new person.  Then appears in 2019 or 2020 she did have stents and angioplasty but was doing fairly okay but then in the last few years has worsening insidious onset of shortness of breath that is slowly getting worse present with exertion relieved by rest.  She believes she might be desaturating [walk 200 feet here and pulse ox went down to 90%].  She says just walking from her kitchen to the living room makes her shortness of breath come on.  She is also got nonproductive cough indigestion and sometimes sore throat.  In early 2024 she was told she had pneumonia with scar tissue then by July 2024 chest x-ray showed resolution of the pneumonia but the scar tissue was  still there but then they did a high-resolution CT chest on her insistence.  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  There is reticulation there is no honeycombing.  Definite craniocaudal appearance but it seems more postinflammatory.  She is known to suffer from diastolic heart failure she has multiple episodes of high BNP.  She is managed by Dr. Arlester Williamson  She also sees Dr. Starr Williamson for sleep apnea and is using CPAP.    CT Chest data from date: *718/24  - personally visualized and independently interpreted : ys - my findings are: as dictated with Narrative & Impression  CLINICAL DATA:  Abnormal chest radiograph   EXAM: CT CHEST WITHOUT CONTRAST   TECHNIQUE: Multidetector CT imaging of the chest was performed following the standard protocol without IV contrast.   RADIATION DOSE REDUCTION: This exam was performed according to the departmental dose-optimization program which includes automated exposure control, adjustment of the Donna and/or kV according to patient size and/or use of iterative reconstruction technique.   COMPARISON:  None Available.   FINDINGS: Cardiovascular: Aortic atherosclerosis. Mild cardiomegaly. Extensive three-vessel coronary artery calcifications no pericardial effusion.   Mediastinum/Nodes: No enlarged mediastinal, hilar, or axillary lymph nodes. Thyroid gland, trachea, and esophagus demonstrate no significant findings.   Lungs/Pleura: Mild pulmonary fibrosis in a pattern with apical  to basal gradient, however with heterogeneous areas involvement, for example in the right upper lobe, featuring irregular peripheral interstitial opacity, septal thickening, mild traction bronchiectasis in the lung bases, as well as small areas of subpleural bronchiolectasis without clear evidence of honeycombing. No pleural effusion or pneumothorax.   Upper Abdomen: No acute  abnormality.   Musculoskeletal: No chest wall abnormality. No acute osseous findings.   IMPRESSION: 1. Mild pulmonary fibrosis in a pattern with apical to basal gradient, however with heterogeneous areas involvement, featuring irregular peripheral interstitial opacity, septal thickening, mild traction bronchiectasis in the lung bases, as well as small areas of subpleural bronchiolectasis without clear evidence of honeycombing. 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 Am Donna Williamson Vol 198, Iss 5, [phone], Jul 01 2017. 2. Coronary artery disease. 3. Aortic atherosclerosis.   Aortic Atherosclerosis (ICD10-I70.0).     Electronically Signed   By: Donna Williamson M.D.   On: 05/18/2023 15:16     OV 10/02/2023  Subjective:  Patient ID: Donna Williamson, female , DOB: 1945-11-16 , age 78 y.o. , MRN: [id number] , ADDRESS: [address] [name] [address] PCP Donna Hun, MD Patient Care Team: Donna Hun, MD as PCP - General (Internal Medicine) Donna Lapping, MD as PCP - Cardiology (Cardiology)  This Provider for this visit: Treatment Team:  Attending Provider: Maire Scot, MD    10/02/2023 -   Chief Complaint  Patient presents with   Follow-up    SOB with exertion and occ prod cough with clear sputum.    #Interstitial lung disease -workup in progress HPI Donna Williamson 78 y.o. -presents with her husband here to discuss test results.  In the interim on 09/18/2023 did see Dr. Norberto Williamson for pain in the right knee.  She took a week of prednisone  that is completing today.  This might of helped her shortness of breath but otherwise no overall change no hospitalizations no ER visits no change in overall symptoms.  She did complete the ILD questionnaire and also the blood work and the results are as  follows     Tourist information centre manager ILD Questionnaire  Symptoms:   Past Medical History :  -She has been on Symbicort for unclear reasons.  She says she has a diagnosis of COPD since 2017 although the CT chest itself does not show that.  She is also seeing allergist Dr. Almeda Jacobs.  Symbicort was started by primary care.  She says allergy test was negative.   That she has diastolic heart failure and she sees cardiology for this  -Actually on today that she has Sjogren's disease since 12s.  She has dry eyes for which she has had lacrimal duct surgery.  She also dry mouth is a chronic issue.  She does not have a rheumatologist but she does see [facility].   - Does have a history of sleep apnea   - Denies any pulmonary hypertension.  No diabetes no thyroid disease no stroke no tuberculosis no kidney disease   -She did have COVID-vaccine.  She has had COVID disease 3 years ago.  ROS:  -Sicca symptoms present.  She uses lemon drops and teardrops  FAMILY HISTORY of LUNG DISEASE:  = She has family history of COPD.  Asthma in the father and COPD in the father.  PERSONAL EXPOSURE HISTORY:  -She got exposed to passive smoking by her ex-husband.  Unclear  duration.  No vaping marijuana no cocaine no intravenous drug use  HOME  EXPOSURE and HOBBY DETAILS :  -[place] the last 21 years.  Age of the home was built in 72.  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  Has worked in Database administrator.  Otherwise detail organic and inorganic antigen exposure history is negative  PULMONARY TOXICITY HISTORY (27 items):  Negative  INVESTIGATIONS: Strongly positive for ANA and SSA consistent with Sjogren's  High-resolution CT chest again which personally visualized.  This time I Agree there  is probable UIP versus indeterminate.  But there is definite ILD pattern there.   PFT with slight reduction in DLCO only.  OV 04/02/2024  Subjective:  Patient ID: Donna Williamson, female , DOB: 1946-05-19 , age 16 y.o. , MRN: [id number] , ADDRESS: [address] [name] [address] PCP Donna Hun, MD Patient Care Team: Donna Hun, MD as PCP - General (Internal Medicine) Donna Lapping, MD as PCP - Cardiology (Cardiology)  This Provider for this visit: Treatment Team:  Attending Provider: Maire Scot, MD    04/02/2024 -   Chief Complaint  Patient presents with   dyspnea on exertion   #Interstitial lung disease with Sjogren antibody positive.  HPI Donna Williamson 78 y.o. -returns for follow-up.  Last seen in December 2024.  At that point we resolved that she would see rheumatology and then come back.  Her ILD burden on the CT scan pulmonary function test was mild.  She tells me in the interim in March 2025 she started taking care of her sick mother Claudene Crystal is currently in hospice and dying].  She was spending many days or even weeks in March 2025 in the hospital.  Then in April 2025 she got hospitalized herself with acute hypoxemic respiratory failure with worsening of groundglass opacities and infiltrates.  Chest x-ray showed unilateral lobar.  She believes she had pneumonia but review of the external medical record shows that her procalcitonin was normal.  Respiratory virus panel was negative.  Pulmonary consult by Dr. Dione Franks considered ILD flareup.  She was discharged on steroids and antibiotics.  She says she took a 45-day course of steroids and finished it yesterday.  She was discharged on 2 L oxygen at rest and 4 L with exertion.  Currently she says she is improved enough that she does not need oxygen at rest but she does desaturate when she does exertions.  She is able to do activities of daily living.  She finished her steroids last week.  She is desperate to  come off the oxygen.  She is also in grief because her mother's passing away.  I personally visualized the hospital CT and agree with the ILD flare diagnosis.  She is going to see Dr. Milton Alpers tomorrow [this would be the first rheumatology consultants taken this long].  Hospital labs also show anemia and elevated BUN.  We wanted to test exercise hypoxemia test today but her systolic was 88.  She is on multiple antihypertensive drugs.  Therefore we decided not to do it.  She is feeling well.  Advised her to go to the ER or seek primary care physician help.    x SYMPTOM SCALE - ILD 10/02/2023   Current weight    O2 use ra   Shortness of Breath  0 -> 5 scale with 5 being worst (score 6 If unable to do)   At rest 0   Simple tasks - showers, clothes change, eating, shaving 4   Household (dishes, doing bed, laundry) 3   Shopping 3   Walking level at own pace 2   Walking up Stairs 5   Total (30-36) Dyspnea Score 17       Non-dyspnea symptoms (0-> 5 scale) 10/02/2023   How bad is your cough? 0   How bad is your fatigue 3   How bad is nausea 0   How bad is vomiting?  0   How bad is diarrhea? 0   How bad is anxiety? 00   How bad is depression 0   Any chronic pain - if so where and how bad 0     PFT     Latest Ref Rng & Units 03/15/2024   10:17 AM 09/27/2023   12:50 PM  PFT Results  FVC-Pre L 2.45  2.46   FVC-Predicted Pre % 87  86   FVC-Post L  2.50   FVC-Predicted Post %  88   Pre FEV1/FVC % % 78  78   Post FEV1/FCV % %  79   FEV1-Pre L 1.90  1.92   FEV1-Predicted Pre % 91  90   FEV1-Post L  1.98   DLCO uncorrected ml/min/mmHg 10.97  11.70   DLCO UNC% % 55  59   DLCO corrected ml/min/mmHg 12.45  11.70   DLCO COR %Predicted % 63  59   DLVA Predicted % 79  74   TLC L  4.82   TLC % Predicted %  91   RV % Predicted %  98        LAB RESULTS last 96 hours No results found.       has a past medical history of Anemia, Anginal pain (HCC), Anxiety, Arthritis,  Asthma, CAD (coronary artery disease), Chest pain, CHF (congestive heart failure) (HCC), Chronic bronchitis (HCC), Chronic lower back pain, COPD (chronic obstructive pulmonary disease) (HCC), GERD (gastroesophageal reflux disease), History of blood transfusion (1960s), Hypercholesterolemia, Hypertension, Lower extremity edema, Mild aortic stenosis (03/07/2023), Mitral valve prolapse, NSTEMI (non-ST elevated myocardial infarction) (HCC) (05/01/2016), Pneumonia (05/01/2016), Seasonal allergies, Sjogren's syndrome (HCC), and Wheezing.   reports that she has never smoked. She has never used smokeless tobacco.  Past Surgical History:  Procedure Laterality Date   ABDOMINAL HYSTERECTOMY  1960s   "partial"   CARDIAC CATHETERIZATION N/A 05/02/2016   Procedure: Right/Left Heart Cath and Coronary Angiography;  Surgeon: Lucendia Rusk, MD;  Location: [facility] INVASIVE CV LAB;  Service: Cardiovascular;  Laterality: N/A;   CARDIAC CATHETERIZATION N/A 05/02/2016   Procedure: Coronary Stent Intervention;  Surgeon: Lucendia Rusk, MD;  Location: [facility] INVASIVE CV LAB;  Service: Cardiovascular;  Laterality: N/A;   CATARACT EXTRACTION W/PHACO Right 09/20/2019   Procedure: CATARACT EXTRACTION PHACO AND INTRAOCULAR LENS PLACEMENT (IOC) RIGHT;  Surgeon: Clair Crews, MD;  Location: [facility] ORS;  Service: Ophthalmology;  Laterality: Right;  US  01:29.4 CDE 19.28 FLUID PACK LOT # [id number]   CATARACT EXTRACTION W/PHACO Left 10/15/2019   Procedure: CATARACT EXTRACTION PHACO AND INTRAOCULAR LENS PLACEMENT (IOC) LEFT 8.76, 00:48.8;  Surgeon: Clair Crews, MD;  Location: [facility];  Service: Ophthalmology;  Laterality: Left;   COLONOSCOPY W/ BIOPSIES AND POLYPECTOMY     CORONARY ANGIOPLASTY WITH STENT PLACEMENT  05/02/2016   "1 stent"   CORONARY PRESSURE/FFR STUDY N/A 02/17/2021   Procedure: INTRAVASCULAR PRESSURE  WIRE/FFR STUDY;  Surgeon: Odie Benne, MD;  Location: [facility] INVASIVE CV LAB;  Service:  Cardiovascular;  Laterality: N/A;   CORONARY STENT INTERVENTION N/A 02/17/2021   Procedure: CORONARY STENT INTERVENTION;  Surgeon: Odie Benne, MD;  Location: [facility] INVASIVE CV LAB;  Service: Cardiovascular;  Laterality: N/A;   ESOPHAGOGASTRODUODENOSCOPY     JOINT REPLACEMENT Left 2007   hip   LAPAROSCOPIC SALPINGOOPHERECTOMY Bilateral ~ 1990   LEFT HEART CATH AND CORONARY ANGIOGRAPHY N/A 02/17/2021   Procedure: LEFT HEART CATH AND CORONARY ANGIOGRAPHY;  Surgeon: Odie Benne, MD;  Location: [facility] INVASIVE CV LAB;  Service: Cardiovascular;  Laterality: N/A;   TOTAL HIP ARTHROPLASTY Left 2007    Allergies  Allergen Reactions   Levofloxacin Anaphylaxis   Plavix  [Clopidogrel  Bisulfate] Rash   Ciprofibrate     Other reaction(s): swelling, muscle aches   Codeine Nausea Only   Drug Ingredient [Ticagrelor ] Other (See Comments)    Patient reports hemorrhage after medical trial of brilinta  in 2017   Nitrofurantoin  Other (See Comments)    VISION CHANGES   Ciprofloxacin Rash    Other Reaction(s): nausea and vomiting   Penicillins Rash   Sulfa Antibiotics Rash    Immunization History  Administered Date(s) Administered   Influenza-Unspecified 07/01/2022, 07/02/2023   Pneumococcal-Unspecified 07/01/2014, 04/30/2022   Respiratory Syncytial Virus Vaccine,Recomb Aduvanted(Arexvy) 08/31/2022   Td 09/10/2019   Tdap 08/23/2015   Unspecified SARS-COV-2 Vaccination 11/25/2019, 12/16/2019, 08/01/2020, 07/16/2021, 04/07/2022, 07/31/2022   Zoster Recombinant(Shingrix) 01/31/2018, 05/07/2018    Family History  Problem Relation Age of Onset   Hypertension Mother    Stroke Mother    Breast cancer Mother    Heart disease Father    Emphysema Father        smoked   Asthma Father    Ovarian cancer Sister      Current Outpatient Medications:    acetaminophen  (TYLENOL ) 500 MG tablet, Take 500-1,000 mg by mouth every 6 (six) hours as needed for moderate pain (pain score 4-6)., Disp:  , Rfl:    albuterol  (PROVENTIL ) (2.5 MG/3ML) 0.083% nebulizer solution, Take 3 mLs (2.5 mg total) by nebulization every 2 (two) hours as needed for wheezing or shortness of breath., Disp: 75 mL, Rfl: 2   ALPRAZolam  (XANAX ) 0.5 MG tablet, Take 0.5 mg by mouth daily as needed for anxiety. , Disp: , Rfl:    aspirin  EC 81 MG tablet, Take 81 mg by mouth daily., Disp: , Rfl:    benzonatate  (TESSALON ) 200 MG capsule, Take 200 mg by mouth 3 (three) times daily as needed for cough., Disp: , Rfl:    budesonide-formoterol  (SYMBICORT) 160-4.5 MCG/ACT inhaler, Inhale 2 puffs into the lungs 2 (two) times daily., Disp: , Rfl:    carboxymethylcellulose (REFRESH PLUS) 0.5 % SOLN, Place 1 drop into both eyes daily as needed (dry eyes)., Disp: , Rfl:    Cholecalciferol  (VITAMIN D3) 2000 UNITS TABS, Take 2,000 Units by mouth daily., Disp: , Rfl:    cyanocobalamin  500 MCG tablet, Take 1,000 mcg by mouth daily., Disp: , Rfl:    diltiazem  (CARDIZEM  CD) 120 MG 24 hr capsule, Take 120 mg by mouth daily., Disp: , Rfl:    EPIPEN  2-PAK 0.3 MG/0.3ML SOAJ injection, Inject 0.3 mg into the skin daily as needed (allergic reaction). , Disp: , Rfl:    estradiol  (ESTRACE ) 0.5 MG tablet, Take 0.5 mg by mouth daily., Disp: , Rfl:    ezetimibe  (ZETIA ) 10 MG tablet, Take 10 mg by mouth at bedtime., Disp: ,  Rfl:    FLUoxetine  (PROZAC ) 20 MG capsule, Take 20 mg by mouth daily., Disp: , Rfl:    fluticasone  (FLONASE ) 50 MCG/ACT nasal spray, Place 2 sprays into the nose daily., Disp: , Rfl:    furosemide  (LASIX ) 20 MG tablet, Take 1 tablet (20 mg total) by mouth daily., Disp: 90 tablet, Rfl: 3   gabapentin  (NEURONTIN ) 300 MG capsule, Take 300 mg by mouth at bedtime., Disp: , Rfl:    inclisiran (LEQVIO ) 284 MG/1.5ML SOSY injection, Inject 284 mg into the skin once., Disp: , Rfl:    isosorbide  mononitrate (IMDUR ) 30 MG 24 hr tablet, Take 1 tablet (30 mg total) by mouth daily., Disp: 90 tablet, Rfl: 1   metoprolol  tartrate (LOPRESSOR ) 25 MG  tablet, TAKE 1/2 TABLET(12.5 MG) BY MOUTH TWICE DAILY, Disp: 90 tablet, Rfl: 3   montelukast  (SINGULAIR ) 10 MG tablet, Take 10 mg by mouth at bedtime., Disp: , Rfl:    Multiple Vitamin (MULTIVITAMIN WITH MINERALS) TABS tablet, Take 1 tablet by mouth daily., Disp: , Rfl:    nitroGLYCERIN  (NITROSTAT ) 0.4 MG SL tablet, Place 1 tablet (0.4 mg total) under the tongue every 5 (five) minutes as needed for chest pain (up to 3 doses. If taking 3rd dose call 911)., Disp: 25 tablet, Rfl: 3   pantoprazole  (PROTONIX ) 40 MG tablet, TAKE 2 TABLETS(80 MG) BY MOUTH DAILY, Disp: 180 tablet, Rfl: 3   potassium chloride  (KLOR-CON ) 10 MEQ tablet, Take 10 mEq by mouth See admin instructions. Take 1 tablet (10 mEq) by mouth on Monday's and Thursday's., Disp: , Rfl:    predniSONE  (DELTASONE ) 20 MG tablet, Take 2 tablets (40 mg total) by mouth daily with breakfast., Disp: 90 tablet, Rfl: 0   PROAIR  HFA 108 (90 BASE) MCG/ACT inhaler, Inhale 1-2 puffs into the lungs every 4 (four) hours as needed for wheezing., Disp: , Rfl:    rosuvastatin  (CRESTOR ) 20 MG tablet, Take 1 tablet (20 mg total) by mouth daily at 6 PM., Disp: 30 tablet, Rfl: 0   dextromethorphan -guaiFENesin  (MUCINEX  DM) 30-600 MG 12hr tablet, Take 1 tablet by mouth 2 (two) times daily. (Patient not taking: Reported on 04/02/2024), Disp: 30 tablet, Rfl: 0      Objective:   Vitals:   04/02/24 1611  BP: (!) 88/54  Pulse: 82  SpO2: 91%  Weight: 217 lb 2 oz (98.5 kg)  Height: 5\' 5"  (1.651 m)    Estimated body mass index is 36.13 kg/m as calculated from the following:   Height as of this encounter: 5\' 5"  (1.651 m).   Weight as of this encounter: 217 lb 2 oz (98.5 kg).  @WEIGHTCHANGE @  [employer]   04/02/24 1611  Weight: 217 lb 2 oz (98.5 kg)     Physical Exam   General: No distress. obese O2 at rest: yes Cane present: no Sitting in wheel chair: no but has walke Frail: no Obese: yes Neuro: Alert and Oriented x 3. GCS 15. Speech normal Psych:  Pleasant Resp:  Barrel Chest - no.  Wheeze - no, Crackles - mild yes, No overt respiratory distress CVS: Normal heart sounds. Murmurs - no Ext: Stigmata of Connective Tissue Disease - no HEENT: Normal upper airway. PEERL +. 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 Sjogren's syndrome with lung involvement (HCC)  M35.02 CBC with Differential    B Nat Peptide    Comp Met (CMET)    Sed Rate (ESR)    CT Chest High Resolution    3. History of diastolic dysfunction  Z86.79 CBC with Differential    B Nat Peptide    Comp Met (CMET)    Sed Rate (ESR)    CT Chest High Resolution    4. History of acute respiratory failure  Z87.09 CBC with Differential    B Nat Peptide    Comp Met (CMET)    Sed Rate (ESR)    CT Chest High Resolution    5. Hospital discharge follow-up  Z09 CBC with Differential    B Nat Peptide    Comp Met (CMET)    Sed Rate (ESR)    CT Chest High Resolution    6. Elevated brain natriuretic peptide (BNP) level  R79.89 CBC with Differential    B Nat Peptide    Comp Met (CMET)    Sed Rate (ESR)    CT Chest High Resolution    7. History of anemia  Z86.2     8. Hypotension, unspecified hypotension type  I95.9          Plan:     Patient Instructions     ICD-10-CM   1. Interstitial lung disease due to connective tissue disease (HCC)  J84.89 CBC with Differential   M35.9 B Nat Peptide    Comp Met (CMET)    Sed Rate (ESR)    CT Chest High Resolution    2. Sjogren's syndrome with lung involvement (HCC)  M35.02 CBC with Differential    B Nat Peptide    Comp Met (CMET)    Sed Rate (ESR)    CT Chest High Resolution    3. History of diastolic dysfunction  Z86.79 CBC with Differential    B Nat Peptide    Comp Met (CMET)    Sed Rate (ESR)    CT Chest  High Resolution    4. History of acute respiratory failure  Z87.09 CBC with Differential    B Nat Peptide    Comp Met (CMET)    Sed Rate (ESR)    CT Chest High Resolution    5. Hospital discharge follow-up  Z09 CBC with Differential    B Nat Peptide    Comp Met (CMET)    Sed Rate (ESR)    CT Chest High Resolution    6. Elevated brain natriuretic peptide (BNP) level  R79.89 CBC with Differential    B Nat Peptide    Comp Met (CMET)    Sed Rate (ESR)    CT Chest High Resolution    7. History of anemia  Z86.2     8. Hypotension, unspecified hypotension type  I95.9      #ILD  Admission Mid April 2-025 due to CAP NOS v ILD flare up v boh. Visitig mother in hospital in March 2025 v SJogren/ILD v both are possible risk factors Course complicated by anemia PFT mid may 2025 vrery similar to Nov 2024 ad suggests you are better Overall better after 45 day prednisone  treatment  #Slight low BP   Plan - do not take BP medicines 04/02/2024 and if worse go to ER -> also call PCP Donna Hun, MD  - check cbc bmet bnp and esr 04/02/2024 -  continue o2 with exertion - keep DR Rice rheum appt - see if he has any treatment ideas - we might have to consider immune medications esp after recent admit -do HRCT mid July 2025  Follow-up - - Return to see Dr. Bertrum Brodie  end of July 2025; 30 min after CT chest   FOLLOWUP Return in about 6 weeks (around 05/14/2024) for 30 min visit, after HRCT chest, with Dr Bertrum Brodie, Face to Face Visit.  ( Level 05 visit E&M 2024: Estb >= 40 min visit type: on-site physical face to visit  in total care time and counseling or/and coordination of care by this undersigned MD - Dr Donna Williamson. This includes one or more of the following on this same day 04/02/2024: pre-charting, chart review, note writing, documentation discussion of test results, diagnostic or treatment recommendations, prognosis, risks and benefits of management options, instructions, education,  compliance or risk-factor reduction. It excludes time spent by the CMA or office staff in the care of the patient. Actual time 42 min)   SIGNATURE    Dr. Maire Williamson, M.D., F.C.C.P,  Pulmonary and Critical Care Medicine Staff Physician, [facility] Director - Interstitial Lung Disease  Program  Pulmonary Fibrosis [facility] at [facility] [place], [place], [address]  Pager: [phone], If no answer or between  15:00h - 7:00h: call [phone] Telephone: [phone]  5:08 PM 04/02/2024

## 2024-04-02 NOTE — Progress Notes (Signed)
 Office Visit Note  Patient: Donna Williamson             Date of Birth: 05/11/1946           MRN: [id number]             PCP: Aldo Hun, MD Referring: Maire Scot, MD Visit Date: 04/03/2024  Subjective:  New Patient (Initial Visit) (Patient states she has sjogren's and it seems to be flaring up now. Patient states Dr. Bertrum Brodie sent the her here to find a potential treatment option. Patient states she is going back to his office today to get labs done. Patient states the last two weeks she has been very fatigued. )   Discussed the use of AI scribe software for clinical note transcription with the patient, who gave verbal consent to proceed.  History of Present Illness   Donna Williamson is an 78 year old female with Sjogren's syndrome who presents with lung issues and low blood pressure. She was referred by Dr. Bertrum Brodie for evaluation of lung issues potentially related to Sjogren's syndrome.  She has a long-standing history of Sjogren's syndrome, diagnosed in the 65s and confirmed by biopsy. Recently, she has experienced a resurgence of symptoms, including significant dry eyes and dry mouth, describing her mouth as 'spit cotton' dry. She uses Refresh Tears for her eyes but has not taken any prescription medications specifically for Sjogren's syndrome in the past. No joint swelling, rashes, or Raynaud's symptoms are present. She has osteoarthritis and bruises easily, which she attributes to the combination of aspirin  and prednisone . No history of glaucoma or excessive sweating.  In April, she was hospitalized for pneumonia and stayed for six days. Following her discharge, she was prescribed high doses of prednisone  (40 mg daily) until last week. She experienced significant bruising while on prednisone  and stopped it abruptly a week ago. Since discontinuing prednisone , she has experienced increased fatigue and low blood pressure, with readings as low as 88/54 mmHg. No diarrhea or  feeling sick but has noticed more fluid in her legs despite taking Lasix  daily.  She is currently on oxygen  therapy, using 2 liters while sitting and 4 liters while walking. She was not on oxygen  prior to her hospitalization. Her oxygen  saturation drops when she stands up, and she uses 2 liters at night. She reports significant fatigue, stating she sometimes struggles to make it into her house and has to rest for extended periods.  Her current medications include 81 mg aspirin , diltiazem  120 mg, and metoprolol  12.5 mg twice a day. She also takes Lasix  daily but has not taken it today.     Labs reviewed 07/2023 ANA 1:320 speckled SSA >8.0 ESR 80 RF neg dsDNA neg  Imaging reviewed 02/07/24 CTA chest IMPRESSION: 1. No pulmonary embolus. 2. Patchy ground-glass airspace opacities of the lung suggestive of infection/inflammation (finding could be representative of COVID). 3. Aortic Atherosclerosis (ICD10-I70.0) including four-vessel coronary calcification and aortic valve leaflet calcification of an correlate for aortic stenosis. 4. Asymmetric right breast tissue with a grossly stable in size 1.3 x 1.4 cm rounded mass. Recommend correlation with physical exam and clinical history. If clinically indicated, please obtain outpatient diagnostic mammography.  Activities of Daily Living:  Patient reports morning stiffness for 0 minute.   Patient Denies nocturnal pain.  Difficulty dressing/grooming: Denies Difficulty climbing stairs: Reports Difficulty getting out of chair: Reports Difficulty using hands for taps, buttons, cutlery, and/or writing: Denies  Review of Systems  Constitutional:  Positive for fatigue.  HENT:  Positive for mouth dryness. Negative for mouth sores.   Eyes:  Positive for dryness.  Respiratory:  Positive for shortness of breath.   Cardiovascular:  Negative for chest pain and palpitations.  Gastrointestinal:  Negative for blood in stool, constipation and diarrhea.   Endocrine: Negative for increased urination.  Genitourinary:  Negative for involuntary urination.  Musculoskeletal:  Positive for joint pain, gait problem and joint pain. Negative for joint swelling, myalgias, muscle weakness, morning stiffness, muscle tenderness and myalgias.  Skin:  Positive for sensitivity to sunlight. Negative for color change, rash and hair loss.  Allergic/Immunologic: Positive for susceptible to infections.  Neurological:  Negative for dizziness and headaches.  Hematological:  Negative for swollen glands.  Psychiatric/Behavioral:  Negative for depressed mood and sleep disturbance. The patient is not nervous/anxious.     PMFS History:  Patient Active Problem List   Diagnosis Date Noted   Pneumonia, organism unspecified(486) 02/07/2024   Pulmonary fibrosis (HCC) 06/09/2023   Mild aortic stenosis 03/07/2023   OSA (obstructive sleep apnea) 02/04/2022   Stage 3a chronic kidney disease (HCC) 02/18/2021   Nocturnal oxygen  desaturation 02/18/2021   PVC's (premature ventricular contractions) 02/18/2021   Asthma 07/24/2020   Coronary artery disease involving native coronary artery with angina pectoris (HCC) 05/09/2016   NSTEMI (non-ST elevated myocardial infarction) (HCC) 05/02/2016   Chronic heart failure with preserved ejection fraction (HCC)    Hyperglycemia 05/01/2016   Mild anemia 05/01/2016   Anxiety 05/01/2016   Depression 05/01/2016   Primary osteoarthritis of left hip 10/23/2015   Unilateral primary osteoarthritis, left knee 10/23/2015   Porokeratosis 09/21/2015   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 04/2016: NSTEMI 95% stenosis 1st Mrg (s/p DES)   Chest pain    a. 10/2013 Exercise Myoview : Ef 65%, no ischemia.   CHF (congestive heart failure) (HCC)    Chronic bronchitis (HCC)    Chronic lower back pain    COPD (chronic obstructive pulmonary disease) (HCC)    GERD (gastroesophageal reflux disease)    History of blood transfusion 1960s   related to my hysterectomy   Hypercholesterolemia    Hypertension    Lower extremity edema    Mild aortic stenosis 03/07/2023   TTE 02/28/2023: EF 55-60, no RWMA, mild asymmetric LVH, NL RVSF, mild MR, mild aortic stenosis (bulky subvalvular calcification), mean AV gradient 9.6, V-max 199 cm/s, RAP 3   Mitral valve prolapse    NSTEMI (non-ST elevated myocardial infarction) (HCC) 05/01/2016   Pneumonia 05/01/2016   saw trace of slight pneumonia/CT scan (05/02/2016)   Seasonal allergies    Sjogren's syndrome (HCC)    Wheezing     Family History  Problem Relation Age of Onset   Hypertension Mother    Stroke Mother    Breast cancer Mother    Heart disease Father    Emphysema Father  smoked   Asthma Father    Ovarian cancer Sister    Past Surgical History:  Procedure Laterality Date   ABDOMINAL HYSTERECTOMY  1960s   partial   CARDIAC CATHETERIZATION N/A 05/02/2016   Procedure: Right/Left Heart Cath and Coronary Angiography;  Surgeon: Lucendia Rusk, MD;  Location: [facility] INVASIVE CV LAB;  Service: Cardiovascular;  Laterality: N/A;   CARDIAC CATHETERIZATION N/A 05/02/2016   Procedure: Coronary Stent Intervention;  Surgeon: Lucendia Rusk, MD;  Location: [facility] INVASIVE CV LAB;  Service: Cardiovascular;  Laterality: N/A;   CATARACT EXTRACTION W/PHACO Right 09/20/2019   Procedure: CATARACT EXTRACTION PHACO AND INTRAOCULAR LENS PLACEMENT (IOC)  RIGHT;  Surgeon: Clair Crews, MD;  Location: [facility] ORS;  Service: Ophthalmology;  Laterality: Right;  US  01:29.4 CDE 19.28 FLUID PACK LOT # [id number]   CATARACT EXTRACTION W/PHACO Left 10/15/2019   Procedure: CATARACT EXTRACTION PHACO AND INTRAOCULAR LENS PLACEMENT (IOC) LEFT 8.76, 00:48.8;  Surgeon: Clair Crews, MD;  Location: [facility];  Service: Ophthalmology;  Laterality: Left;   COLONOSCOPY W/ BIOPSIES AND POLYPECTOMY     CORONARY ANGIOPLASTY WITH STENT PLACEMENT  05/02/2016   1 stent   CORONARY PRESSURE/FFR STUDY N/A 02/17/2021   Procedure: INTRAVASCULAR PRESSURE WIRE/FFR STUDY;  Surgeon: Odie Benne, MD;  Location: [facility] INVASIVE CV LAB;  Service: Cardiovascular;  Laterality: N/A;   CORONARY STENT INTERVENTION N/A 02/17/2021   Procedure: CORONARY STENT INTERVENTION;  Surgeon: Odie Benne, MD;  Location: [facility] INVASIVE CV LAB;  Service: Cardiovascular;  Laterality: N/A;   ESOPHAGOGASTRODUODENOSCOPY     JOINT REPLACEMENT Left 2007   hip   LAPAROSCOPIC SALPINGOOPHERECTOMY Bilateral ~ 1990   LEFT HEART CATH AND CORONARY ANGIOGRAPHY N/A 02/17/2021   Procedure: LEFT HEART CATH AND CORONARY ANGIOGRAPHY;  Surgeon: Odie Benne, MD;  Location: [facility] INVASIVE CV LAB;  Service: Cardiovascular;  Laterality: N/A;   TOTAL HIP ARTHROPLASTY Left 2007   Social History   Social History Narrative   Lives in [place] with husband.  Active but does not routinely exercise.   Immunization History  Administered Date(s) Administered   Influenza-Unspecified 07/01/2022, 07/02/2023   Pneumococcal-Unspecified 07/01/2014, 04/30/2022   Respiratory Syncytial Virus Vaccine,Recomb Aduvanted(Arexvy) 08/31/2022   Td 09/10/2019   Tdap 08/23/2015   Unspecified SARS-COV-2 Vaccination 11/25/2019, 12/16/2019, 08/01/2020, 07/16/2021, 04/07/2022, 07/31/2022   Zoster Recombinant(Shingrix) 01/31/2018, 05/07/2018     Objective: Vital Signs: BP 97/62 (BP Location: Right Arm,  Patient Position: Sitting, Cuff Size: Normal)   Pulse 82   Resp 16   Ht 5' 5 (1.651 m)   Wt 219 lb (99.3 kg)   BMI 36.44 kg/m    Physical Exam HENT:     Mouth/Throat:     Comments: Very dry mouth, loss of lingual fissures and no pooling No salivary swelling no lymph nodes  Eyes:     Conjunctiva/sclera: Conjunctivae normal.    Cardiovascular:     Rate and Rhythm: Normal rate and regular rhythm.  Pulmonary:     Effort: Pulmonary effort is normal.     Comments: Bilateral inspiratory crackles Supplemental oxygen  by nasal cannula  Musculoskeletal:     Right lower leg: No edema.     Left lower leg: No edema.  Lymphadenopathy:     Cervical: No cervical adenopathy.   Skin:    General: Skin is warm and dry.     Findings: No rash.   Neurological:     Mental Status: She is alert.   Psychiatric:        Mood and  Affect: Mood normal.      Musculoskeletal Exam:  Shoulders full ROM no tenderness or swelling Elbows full ROM no tenderness or swelling Wrists full ROM no tenderness or swelling Fingers full ROM b/l heberdon ndes without synovitis Knees full ROM no tenderness or swelling   Investigation: No additional findings.  Imaging: CT Chest High Resolution Result Date: 04/03/2024 CLINICAL DATA:  Pneumonia with complication suspected. X-ray done. Fatigue. Low blood pressure. EXAM: CT CHEST WITHOUT CONTRAST TECHNIQUE: Multidetector CT imaging of the chest was performed following the standard protocol without intravenous contrast. High resolution imaging of the lungs, as well as inspiratory and expiratory imaging, was performed. RADIATION DOSE REDUCTION: This exam was performed according to the departmental dose-optimization program which includes automated exposure control, adjustment of the mA and/or kV according to patient size and/or use of iterative reconstruction technique. COMPARISON:  Chest radiograph 04/03/2024.  CT chest 02/07/2024 FINDINGS: Cardiovascular: Normal heart  size. No pericardial effusions. Normal caliber thoracic aorta. Calcification in the aorta and coronary arteries. Mediastinum/Nodes: Thyroid gland is unremarkable. Scattered mediastinal lymph nodes are not pathologically enlarged, likely reactive. Esophagus is decompressed. Lungs/Pleura: Coarse interstitial infiltrates in the lungs. Ground-glass pattern seen previously is improved. Emphysematous changes in the lungs. 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 No bronchiectasis or reticulonodular changes. Upper Abdomen: No acute abnormalities. Musculoskeletal: Degenerative changes in the spine. No acute bony abnormalities. IMPRESSION: 1. Coarse interstitial infiltrates throughout the lungs with subpleural fibrosis likely indicating usual interstitial pneumonitis with scarring. 2. Ground-glass pattern seen previously is improved since prior study suggesting resolution of acute inflammatory process/edema. 3. Aortic atherosclerosis. Electronically Signed   By: Boyce Byes M.D.   On: 04/03/2024 18:22   DG Chest 2 View Result Date: 04/03/2024 CLINICAL DATA:  Shunt breath EXAM: CHEST - 2 VIEW COMPARISON:  February 07, 2024 FINDINGS: Minimal residual right upper lobe fibro atelectatic changes improved since prior examination. Ill-defined left lower lobe retrocardiac infiltrates and atelectasis could correlate with developing pneumonia No pleural effusion Heart and mediastinum normal IMPRESSION: Developing left lower lobe infiltrates Electronically Signed   By: Fredrich Jefferson M.D.   On: 04/03/2024 11:51    Recent Labs: Lab Results  Component Value Date   WBC 9.8 04/03/2024   HGB 10.5 (L) 04/03/2024   PLT 284 04/03/2024   NA 133 (L) 04/03/2024   K 3.9 04/03/2024   CL 100 04/03/2024   CO2 20 (L) 04/03/2024   GLUCOSE 114 (H) 04/03/2024   BUN 23 04/03/2024   CREATININE 2.11 (H) 04/03/2024   BILITOT 0.3 02/10/2024   ALKPHOS 62 02/10/2024    AST 36 02/10/2024   ALT 30 02/10/2024   PROT 6.4 (L) 02/10/2024   ALBUMIN 2.8 (L) 02/10/2024   CALCIUM  8.6 (L) 04/03/2024   GFRAA 45 (L) 06/20/2019   QFTBGOLDPLUS NEGATIVE 07/27/2023    Speciality Comments: No specialty comments available.  Procedures:  No procedures performed Allergies: Levofloxacin, Plavix  [clopidogrel  bisulfate], Ciprofibrate, Codeine, Drug ingredient [ticagrelor ], Nitrofurantoin , Ciprofloxacin, Penicillins, and Sulfa antibiotics   Assessment / Plan:     Visit Diagnoses: Sjogren's syndrome with lung involvement She has considerable chronic dryness I do not see other very concerning features of active systemic Sjogren's syndrome that would warrant aggressive DMARD treatment for extrapulmonary disease.  Recent imaging suggests post-infection or post-inflammatory changes rather than typical Sjogren's autoimmune lung disease. Awaiting high-resolution chest CT for further evaluation. Cellcept may be considered if Sjogren's-related lung involvement is confirmed. Needs upcoming HRCT Chest for stable vs  progression. - Review blood work for inflammation markers. - Consider Cellcept if CT confirms Sjogren's-related lung involvement.  Dry mouth and eyes due to Sjogren's syndrome Persistent dryness due to Sjogren's syndrome. Discussed pilocarpine for symptom relief, noting increased sweating as a common side effect. - Consider pilocarpine for dryness. - Monitor for increased sweating.  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 Explained need for gradual tapering to allow adrenal recovery. - Resume prednisone  10 mg daily for 5-7 days. - Monitor symptoms and blood pressure. - Skip diltiazem  today.  Hypotension Hypotension secondary to adrenal insufficiency from steroid withdrawal. Explained cortisol's role in maintaining blood  pressure. - Resume prednisone  10 mg daily for 5-7 days. - Skip diltiazem  today. - Monitor blood pressure and symptoms.  Leg swelling Leg swelling possibly related to low blood pressure and adrenal insufficiency. Improved with prednisone , suggesting vascular involvement. - Resume prednisone  10 mg daily for 5-7 days. - Monitor leg swelling.     Follow-Up Instructions: No follow-ups on file.   Matt Song, MD  Note - This record has been created using [employer].  Chart creation errors have been sought, but may not always  have been located. Such creation errors do not reflect on  the standard of medical care.

## 2024-04-02 NOTE — Patient Instructions (Addendum)
 ICD-10-CM   1. Interstitial lung disease due to connective tissue disease (HCC)  J84.89 CBC with Differential   M35.9 B Nat Peptide    Comp Met (CMET)    Sed Rate (ESR)    CT Chest High Resolution    2. Sjogren's syndrome with lung involvement (HCC)  M35.02 CBC with Differential    B Nat Peptide    Comp Met (CMET)    Sed Rate (ESR)    CT Chest High Resolution    3. History of diastolic dysfunction  Z86.79 CBC with Differential    B Nat Peptide    Comp Met (CMET)    Sed Rate (ESR)    CT Chest High Resolution    4. History of acute respiratory failure  Z87.09 CBC with Differential    B Nat Peptide    Comp Met (CMET)    Sed Rate (ESR)    CT Chest High Resolution    5. Hospital discharge follow-up  Z09 CBC with Differential    B Nat Peptide    Comp Met (CMET)    Sed Rate (ESR)    CT Chest High Resolution    6. Elevated brain natriuretic peptide (BNP) level  R79.89 CBC with Differential    B Nat Peptide    Comp Met (CMET)    Sed Rate (ESR)    CT Chest High Resolution    7. History of anemia  Z86.2     8. Hypotension, unspecified hypotension type  I95.9      #ILD  Admission Mid April 2-025 due to CAP NOS v ILD flare up v boh. Visitig mother in hospital in March 2025 v SJogren/ILD v both are possible risk factors Course complicated by anemia PFT mid may 2025 vrery similar to Nov 2024 ad suggests you are better Overall better after 45 day prednisone  treatment  #Slight low BP   Plan - do not take BP medicines 04/02/2024 and if worse go to ER -> also call PCP Aldo Hun, MD  - check cbc bmet bnp and esr 04/02/2024 - continue o2 with exertion - keep DR Rice rheum appt - see if he has any treatment ideas - we might have to consider immune medications esp after recent admit -do HRCT mid July 2025  Follow-up - - Return to see Dr. Bertrum Brodie  end of July 2025; 30 min after CT chest

## 2024-04-03 ENCOUNTER — Other Ambulatory Visit: Payer: Self-pay

## 2024-04-03 ENCOUNTER — Emergency Department (HOSPITAL_COMMUNITY)
Admission: EM | Admit: 2024-04-03 | Discharge: 2024-04-03 | Disposition: A | Discharge status: Home / Self Care | Attending: Student | Admitting: Student

## 2024-04-03 ENCOUNTER — Emergency Department (HOSPITAL_COMMUNITY)

## 2024-04-03 ENCOUNTER — Encounter: Payer: Self-pay | Admitting: Internal Medicine

## 2024-04-03 ENCOUNTER — Encounter (HOSPITAL_COMMUNITY): Payer: Self-pay | Admitting: Emergency Medicine

## 2024-04-03 ENCOUNTER — Ambulatory Visit: Payer: Medicare PPO | Attending: Internal Medicine | Admitting: Internal Medicine

## 2024-04-03 VITALS — BP 97/62 | HR 82 | Resp 16 | Ht 65.0 in | Wt 219.0 lb

## 2024-04-03 DIAGNOSIS — N390 Urinary tract infection, site not specified: Secondary | ICD-10-CM | POA: Diagnosis not present

## 2024-04-03 DIAGNOSIS — H04123 Dry eye syndrome of bilateral lacrimal glands: Secondary | ICD-10-CM

## 2024-04-03 DIAGNOSIS — M3502 Sicca syndrome with lung involvement: Secondary | ICD-10-CM

## 2024-04-03 DIAGNOSIS — Z79899 Other long term (current) drug therapy: Secondary | ICD-10-CM | POA: Insufficient documentation

## 2024-04-03 DIAGNOSIS — I130 Hypertensive heart and chronic kidney disease with heart failure and stage 1 through stage 4 chronic kidney disease, or unspecified chronic kidney disease: Secondary | ICD-10-CM | POA: Diagnosis not present

## 2024-04-03 DIAGNOSIS — J4489 Other specified chronic obstructive pulmonary disease: Secondary | ICD-10-CM | POA: Diagnosis not present

## 2024-04-03 DIAGNOSIS — Z955 Presence of coronary angioplasty implant and graft: Secondary | ICD-10-CM | POA: Insufficient documentation

## 2024-04-03 DIAGNOSIS — Z7951 Long term (current) use of inhaled steroids: Secondary | ICD-10-CM | POA: Insufficient documentation

## 2024-04-03 DIAGNOSIS — Z96642 Presence of left artificial hip joint: Secondary | ICD-10-CM | POA: Insufficient documentation

## 2024-04-03 DIAGNOSIS — I25119 Atherosclerotic heart disease of native coronary artery with unspecified angina pectoris: Secondary | ICD-10-CM | POA: Insufficient documentation

## 2024-04-03 DIAGNOSIS — I959 Hypotension, unspecified: Secondary | ICD-10-CM | POA: Diagnosis not present

## 2024-04-03 DIAGNOSIS — R42 Dizziness and giddiness: Secondary | ICD-10-CM | POA: Diagnosis present

## 2024-04-03 DIAGNOSIS — I951 Orthostatic hypotension: Secondary | ICD-10-CM

## 2024-04-03 DIAGNOSIS — J984 Other disorders of lung: Secondary | ICD-10-CM | POA: Diagnosis not present

## 2024-04-03 DIAGNOSIS — N179 Acute kidney failure, unspecified: Secondary | ICD-10-CM | POA: Diagnosis not present

## 2024-04-03 DIAGNOSIS — N1831 Chronic kidney disease, stage 3a: Secondary | ICD-10-CM | POA: Insufficient documentation

## 2024-04-03 DIAGNOSIS — Z7982 Long term (current) use of aspirin: Secondary | ICD-10-CM | POA: Insufficient documentation

## 2024-04-03 DIAGNOSIS — I5030 Unspecified diastolic (congestive) heart failure: Secondary | ICD-10-CM | POA: Insufficient documentation

## 2024-04-03 DIAGNOSIS — M25561 Pain in right knee: Secondary | ICD-10-CM | POA: Diagnosis not present

## 2024-04-03 DIAGNOSIS — B3749 Other urogenital candidiasis: Secondary | ICD-10-CM

## 2024-04-03 DIAGNOSIS — G8929 Other chronic pain: Secondary | ICD-10-CM

## 2024-04-03 DIAGNOSIS — R918 Other nonspecific abnormal finding of lung field: Secondary | ICD-10-CM | POA: Diagnosis not present

## 2024-04-03 LAB — BASIC METABOLIC PANEL WITH GFR
Anion gap: 13 (ref 5–15)
BUN: 23 mg/dL (ref 8–23)
CO2: 20 mmol/L — ABNORMAL LOW (ref 22–32)
Calcium: 8.6 mg/dL — ABNORMAL LOW (ref 8.9–10.3)
Chloride: 100 mmol/L (ref 98–111)
Creatinine, Ser: 2.11 mg/dL — ABNORMAL HIGH (ref 0.44–1.00)
GFR, Estimated: 23 mL/min — ABNORMAL LOW (ref >60)
Glucose, Bld: 114 mg/dL — ABNORMAL HIGH (ref 70–99)
Potassium: 3.9 mmol/L (ref 3.5–5.1)
Sodium: 133 mmol/L — ABNORMAL LOW (ref 135–145)

## 2024-04-03 LAB — URINALYSIS, ROUTINE W REFLEX MICROSCOPIC
Bilirubin Urine: NEGATIVE
Glucose, UA: NEGATIVE mg/dL
Hgb urine dipstick: NEGATIVE
Ketones, ur: NEGATIVE mg/dL
Nitrite: NEGATIVE
Protein, ur: NEGATIVE mg/dL
Specific Gravity, Urine: <1.005 — ABNORMAL LOW (ref 1.005–1.030)
pH: 6 (ref 5.0–8.0)

## 2024-04-03 LAB — URINALYSIS, MICROSCOPIC (REFLEX)

## 2024-04-03 LAB — CBC WITH DIFFERENTIAL/PLATELET
Abs Immature Granulocytes: 0.06 10*3/uL (ref 0.00–0.07)
Basophils Absolute: 0.1 10*3/uL (ref 0.0–0.1)
Basophils Relative: 1 %
Eosinophils Absolute: 0.3 10*3/uL (ref 0.0–0.5)
Eosinophils Relative: 3 %
HCT: 32.5 % — ABNORMAL LOW (ref 36.0–46.0)
Hemoglobin: 10.5 g/dL — ABNORMAL LOW (ref 12.0–15.0)
Immature Granulocytes: 1 %
Lymphocytes Relative: 30 %
Lymphs Abs: 3 10*3/uL (ref 0.7–4.0)
MCH: 32.4 pg (ref 26.0–34.0)
MCHC: 32.3 g/dL (ref 30.0–36.0)
MCV: 100.3 fL — ABNORMAL HIGH (ref 80.0–100.0)
Monocytes Absolute: 0.7 10*3/uL (ref 0.1–1.0)
Monocytes Relative: 7 %
Neutro Abs: 5.8 10*3/uL (ref 1.7–7.7)
Neutrophils Relative %: 58 %
Platelets: 284 10*3/uL (ref 150–400)
RBC: 3.24 MIL/uL — ABNORMAL LOW (ref 3.87–5.11)
RDW: 13.2 % (ref 11.5–15.5)
WBC: 9.8 10*3/uL (ref 4.0–10.5)
nRBC: 0 % (ref 0.0–0.2)

## 2024-04-03 LAB — LACTIC ACID, PLASMA: Lactic Acid, Venous: 1.3 mmol/L (ref 0.5–1.9)

## 2024-04-03 LAB — BRAIN NATRIURETIC PEPTIDE: B Natriuretic Peptide: 54.4 pg/mL (ref 0.0–100.0)

## 2024-04-03 MED ORDER — FLUCONAZOLE 150 MG PO TABS
75.0000 mg | ORAL_TABLET | Freq: Once | ORAL | Status: AC
  Administered 2024-04-03: 75 mg via ORAL
  Filled 2024-04-03 (×2): qty 1

## 2024-04-03 MED ORDER — LACTATED RINGERS IV BOLUS
1000.0000 mL | Freq: Once | INTRAVENOUS | Status: AC
  Administered 2024-04-03: 1000 mL via INTRAVENOUS

## 2024-04-03 NOTE — Patient Instructions (Addendum)
 You can review the information about pilocarpine if you want to try adding a medication for dry eye and mouth, I would wait until seeing after current low blood pressure is improving fitst.  I recommend adding back a lower dose of prednisone  10 mg (1/2 tablet) for the next 1 week to see if this improves the current low blood pressure, swelling, and fatigue.

## 2024-04-03 NOTE — ED Notes (Signed)
 Pt in gown and connected to monitor, family @ bedside

## 2024-04-03 NOTE — ED Provider Notes (Signed)
 [place] EMERGENCY DEPARTMENT AT [facility] Provider Note  CSN: [id number] Arrival date & time: 04/03/24 1055  Chief Complaint(s) Fatigue  HPI Donna Williamson is a 78 y.o. female with PMH CAD status post PCI, CHF, GERD, Sjogren's, HTN, HLD, ILD on chronic home O2 with recent hospital admission on 02/12/2024 with ultimate discharge on atovaquone  who presents emergency room for evaluation of hypotension.  Patient was out of rheumatologist today and was found to have a blood pressure of 90/60.  She was reportedly on a very extended course of prednisone  that she recently stopped last week.  Rheumatology was concern for adrenal insufficiency and she was transferred to the emergency room for further evaluation.  Here in the emergency room, patient is asymptomatic denying any chest pain, shortness of breath, abdominal pain, nausea, vomiting or other specific symptoms.  She arrives normotensive.   Past Medical History Past Medical History:  Diagnosis Date   Anemia    Anginal pain (HCC)    Anxiety    Arthritis    "feet, knees, back" (05/02/2016)   Asthma    CAD (coronary artery disease)    a. 04/2016: NSTEMI 95% stenosis 1st Mrg (s/p DES)   Chest pain    a. 10/2013 Exercise Myoview : Ef 65%, no ischemia.   CHF (congestive heart failure) (HCC)    Chronic bronchitis (HCC)    Chronic lower back pain    COPD (chronic obstructive pulmonary disease) (HCC)    GERD (gastroesophageal reflux disease)    History of blood transfusion 1960s   "related to my hysterectomy"   Hypercholesterolemia    Hypertension    Lower extremity edema    Mild aortic stenosis 03/07/2023   TTE 02/28/2023: EF 55-60, no RWMA, mild asymmetric LVH, NL RVSF, mild MR, mild aortic stenosis (bulky subvalvular calcification), mean AV gradient 9.6, V-max 199 cm/s, RAP 3   Mitral valve prolapse    NSTEMI (non-ST elevated myocardial infarction) (HCC) 05/01/2016   Pneumonia 05/01/2016   "saw trace of slight pneumonia/CT  scan" (05/02/2016)   Seasonal allergies    Sjogren's syndrome (HCC)    Wheezing    Patient Active Problem List   Diagnosis Date Noted   Pneumonia, organism unspecified(486) 02/07/2024   Pulmonary fibrosis (HCC) 06/09/2023   Mild aortic stenosis 03/07/2023   OSA (obstructive sleep apnea) 02/04/2022   Stage 3a chronic kidney disease (HCC) 02/18/2021   Nocturnal oxygen desaturation 02/18/2021   PVC's (premature ventricular contractions) 02/18/2021   Asthma 07/24/2020   Coronary artery disease involving native coronary artery with angina pectoris (HCC) 05/09/2016   NSTEMI (non-ST elevated myocardial infarction) (HCC) 05/02/2016   Chronic heart failure with preserved ejection fraction (HCC)    Hyperglycemia 05/01/2016   Mild anemia 05/01/2016   Anxiety 05/01/2016   Depression 05/01/2016   Primary osteoarthritis of left hip 10/23/2015   Unilateral primary osteoarthritis, left knee 10/23/2015   Porokeratosis 09/21/2015   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 Authorizing Provider  acetaminophen  (TYLENOL ) 500 MG tablet Take 500-1,000 mg by mouth every 6 (six) hours as needed  for moderate pain (pain score 4-6).    [provider]  albuterol  (PROVENTIL ) (2.5 MG/3ML) 0.083% nebulizer solution Take 3 mLs (2.5 mg total) by nebulization every 2 (two) hours as needed for wheezing or shortness of breath. 05/04/16   Akula, Vijaya, MD  ALPRAZolam  (XANAX ) 0.5 MG tablet Take 0.5 mg by mouth daily as needed for anxiety.     [provider]  aspirin  EC 81 MG tablet Take 81 mg by mouth daily.    [provider]  benzonatate  (TESSALON ) 200 MG capsule Take 200 mg by mouth 3 (three) times daily as needed for cough. 01/18/24   [provider]  budesonide-formoterol  (SYMBICORT) 160-4.5 MCG/ACT inhaler Inhale 2 puffs into the lungs 2 (two) times daily.    [provider]  carboxymethylcellulose (REFRESH PLUS) 0.5 % SOLN Place 1 drop into both eyes daily as needed (dry eyes).    [provider]  Cholecalciferol  (VITAMIN D3) 2000 UNITS TABS Take 2,000 Units by mouth daily.    [provider]  cyanocobalamin  500 MCG tablet Take 1,000 mcg by mouth daily.    [provider]  dextromethorphan -guaiFENesin  (MUCINEX  DM) 30-600 MG 12hr tablet Take 1 tablet by mouth 2 (two) times daily. Patient not taking: Reported on 04/03/2024 02/12/24   Gherghe, Costin M, MD  diltiazem  (CARDIZEM  CD) 120 MG 24 hr capsule Take 120 mg by mouth daily. Patient not taking: Reported on 04/03/2024 01/07/24   [provider]  EPIPEN  2-PAK 0.3 MG/0.3ML SOAJ injection Inject 0.3 mg into the skin daily as needed (allergic reaction).  11/01/13   [provider]  estradiol  (ESTRACE ) 0.5 MG tablet Take 0.5 mg by mouth daily.    [provider]  ezetimibe  (ZETIA ) 10 MG tablet Take 10 mg by mouth at bedtime.    [provider]  FLUoxetine  (PROZAC ) 20 MG capsule Take 20 mg by mouth daily. 11/22/23   [provider]  fluticasone  (FLONASE ) 50 MCG/ACT nasal spray Place 2 sprays into the nose daily.    [provider]  furosemide  (LASIX ) 20 MG tablet Take 1 tablet (20 mg total) by mouth daily. 01/15/24   Arnoldo Lapping, MD  gabapentin  (NEURONTIN ) 100 MG capsule Take 100 mg by mouth daily. 03/28/24   [provider]  gabapentin  (NEURONTIN ) 300 MG capsule Take 300 mg by mouth at bedtime. Patient not taking: Reported on 04/03/2024 01/07/24   [provider]   inclisiran (LEQVIO ) 284 MG/1.5ML SOSY injection Inject 284 mg into the skin once.    [provider]  isosorbide  mononitrate (IMDUR ) 30 MG 24 hr tablet Take 1 tablet (30 mg total) by mouth daily. 02/15/24   Cooper, Michael, MD  metoprolol  tartrate (LOPRESSOR ) 25 MG tablet TAKE 1/2 TABLET(12.5 MG) BY MOUTH TWICE DAILY 03/14/24   Cooper, Michael, MD  montelukast  (SINGULAIR ) 10 MG tablet Take 10 mg by mouth at bedtime.    [provider]  Multiple Vitamin (MULTIVITAMIN WITH MINERALS) TABS tablet Take 1 tablet by mouth daily.    [provider]  nitroGLYCERIN  (NITROSTAT ) 0.4 MG SL tablet Place 1 tablet (0.4 mg total) under the tongue every 5 (five) minutes as needed for chest pain (up to 3 doses. If taking 3rd dose call 911). 02/04/22   Marlyse Single T, PA-C  pantoprazole  (PROTONIX ) 40 MG tablet TAKE 2 TABLETS(80 MG) BY MOUTH DAILY 03/21/24   Arnoldo Lapping, MD  potassium chloride  (KLOR-CON ) 10 MEQ tablet Take 10 mEq by mouth See admin instructions. Take 1 tablet (  10 mEq) by mouth on Monday's and Thursday's. 05/09/22   [provider]  predniSONE  (DELTASONE ) 20 MG tablet Take 2 tablets (40 mg total) by mouth daily with breakfast. Patient not taking: Reported on 04/03/2024 02/12/24   Gherghe, Costin M, MD  PROAIR  HFA 108 (90 BASE) MCG/ACT inhaler Inhale 1-2 puffs into the lungs every 4 (four) hours as needed for wheezing. 11/01/13   [provider]  rosuvastatin  (CRESTOR ) 20 MG tablet Take 1 tablet (20 mg total) by mouth daily at 6 PM. 05/30/16   Narvis Bad, PA                                                                                                                                    Past Surgical History Past Surgical History:  Procedure Laterality Date   ABDOMINAL HYSTERECTOMY  1960s   "partial"   CARDIAC CATHETERIZATION N/A 05/02/2016   Procedure: Right/Left Heart Cath and Coronary Angiography;  Surgeon: Lucendia Rusk, MD;  Location: [facility] INVASIVE  CV LAB;  Service: Cardiovascular;  Laterality: N/A;   CARDIAC CATHETERIZATION N/A 05/02/2016   Procedure: Coronary Stent Intervention;  Surgeon: Lucendia Rusk, MD;  Location: [facility] INVASIVE CV LAB;  Service: Cardiovascular;  Laterality: N/A;   CATARACT EXTRACTION W/PHACO Right 09/20/2019   Procedure: CATARACT EXTRACTION PHACO AND INTRAOCULAR LENS PLACEMENT (IOC) RIGHT;  Surgeon: Clair Crews, MD;  Location: [facility] ORS;  Service: Ophthalmology;  Laterality: Right;  US  01:29.4 CDE 19.28 FLUID PACK LOT # [id number]   CATARACT EXTRACTION W/PHACO Left 10/15/2019   Procedure: CATARACT EXTRACTION PHACO AND INTRAOCULAR LENS PLACEMENT (IOC) LEFT 8.76, 00:48.8;  Surgeon: Clair Crews, MD;  Location: [facility] SURGERY CNTR;  Service: Ophthalmology;  Laterality: Left;   COLONOSCOPY W/ BIOPSIES AND POLYPECTOMY     CORONARY ANGIOPLASTY WITH STENT PLACEMENT  05/02/2016   "1 stent"   CORONARY PRESSURE/FFR STUDY N/A 02/17/2021   Procedure: INTRAVASCULAR PRESSURE WIRE/FFR STUDY;  Surgeon: Odie Benne, MD;  Location: [facility] INVASIVE CV LAB;  Service: Cardiovascular;  Laterality: N/A;   CORONARY STENT INTERVENTION N/A 02/17/2021   Procedure: CORONARY STENT INTERVENTION;  Surgeon: Odie Benne, MD;  Location: [facility] INVASIVE CV LAB;  Service: Cardiovascular;  Laterality: N/A;   ESOPHAGOGASTRODUODENOSCOPY     JOINT REPLACEMENT Left 2007   hip   LAPAROSCOPIC SALPINGOOPHERECTOMY Bilateral ~ 1990   LEFT HEART CATH AND CORONARY ANGIOGRAPHY N/A 02/17/2021   Procedure: LEFT HEART CATH AND CORONARY ANGIOGRAPHY;  Surgeon: Odie Benne, MD;  Location: [facility] INVASIVE CV LAB;  Service: Cardiovascular;  Laterality: N/A;   TOTAL HIP ARTHROPLASTY Left 2007   Family History Family History  Problem Relation Age of Onset   Hypertension Mother    Stroke Mother    Breast cancer Mother    Heart disease Father    Emphysema Father        smoked   Asthma Father    Ovarian cancer Sister  Social  History Social History   Tobacco Use   Smoking status: Never    Passive exposure: Past   Smokeless tobacco: Never  Vaping Use   Vaping status: Never Used  Substance Use Topics   Alcohol  use: No    Alcohol /week: 0.0 standard drinks of alcohol    Drug use: No   Allergies Levofloxacin, Plavix  [clopidogrel  bisulfate], Ciprofibrate, Codeine, Drug ingredient [ticagrelor ], Nitrofurantoin , Ciprofloxacin, Penicillins, and Sulfa antibiotics  Review of Systems Review of Systems  Constitutional:  Positive for fatigue.  Neurological:  Positive for light-headedness.    Physical Exam Vital Signs  I have reviewed the triage vital signs BP 123/65   Pulse 72   Temp 97.7 F (36.5 C)   Resp 17   Wt 99.3 kg   SpO2 100%   BMI 36.43 kg/m   Physical Exam Vitals and nursing note reviewed.  Constitutional:      General: She is not in acute distress.    Appearance: She is well-developed.  HENT:     Head: Normocephalic and atraumatic.  Eyes:     Conjunctiva/sclera: Conjunctivae normal.  Cardiovascular:     Rate and Rhythm: Normal rate and regular rhythm.     Heart sounds: No murmur heard. Pulmonary:     Effort: Pulmonary effort is normal. No respiratory distress.     Breath sounds: Normal breath sounds.  Abdominal:     Palpations: Abdomen is soft.     Tenderness: There is no abdominal tenderness.  Musculoskeletal:        General: No swelling.     Cervical back: Neck supple.  Skin:    General: Skin is warm and dry.     Capillary Refill: Capillary refill takes less than 2 seconds.  Neurological:     Mental Status: She is alert.  Psychiatric:        Mood and Affect: Mood normal.     ED Results and Treatments Labs (all labs ordered are listed, but only abnormal results are displayed) Labs Reviewed  BASIC METABOLIC PANEL WITH GFR - Abnormal; Notable for the following components:      Result Value   Sodium 133 (*)    CO2 20 (*)    Glucose, Bld 114 (*)    Creatinine, Ser 2.11  (*)    Calcium  8.6 (*)    GFR, Estimated 23 (*)    All other components within normal limits  CBC WITH DIFFERENTIAL/PLATELET - Abnormal; Notable for the following components:   RBC 3.24 (*)    Hemoglobin 10.5 (*)    HCT 32.5 (*)    MCV 100.3 (*)    All other components within normal limits  BRAIN NATRIURETIC PEPTIDE  URINALYSIS, ROUTINE W REFLEX MICROSCOPIC  LACTIC ACID, PLASMA  LACTIC ACID, PLASMA                                                                                                                          Radiology DG Chest 2 View  Result Date: 04/03/2024 CLINICAL DATA:  Shunt breath EXAM: CHEST - 2 VIEW COMPARISON:  February 07, 2024 FINDINGS: Minimal residual right upper lobe fibro atelectatic changes improved since prior examination. Ill-defined left lower lobe retrocardiac infiltrates and atelectasis could correlate with developing pneumonia No pleural effusion Heart and mediastinum normal IMPRESSION: Developing left lower lobe infiltrates Electronically Signed   By: Fredrich Jefferson M.D.   On: 04/03/2024 11:51    Pertinent labs & imaging results that were available during my care of the patient were reviewed by me and considered in my medical decision making (see MDM for details).  Medications Ordered in ED Medications  lactated ringers  bolus 1,000 mL (1,000 mLs Intravenous New Bag/Given 04/03/24 1703)                                                                                                                                     Procedures Procedures  (including critical care time)  Medical Decision Making / ED Course   This patient presents to the ED for concern of hypotension, lightheadedness, this involves an extensive number of treatment options, and is a complaint that carries with it a high risk of complications and morbidity.  The differential diagnosis includes dehydration, orthostatic presyncope, cardiogenic presyncope, medication side effect, adrenal  insufficiency, bacteremia  MDM: Patient seen emergency room for evaluation of hypotension and lightheadedness.  Physical exam is unremarkable.  Laboratory evaluation with a hemoglobin of 10.5, new creatinine elevation to 2.11 with a GFR dropped to 23 which is a significant change for this patient.  Lactic acid is normal BMP normal.  ECG without evidence of dysrhythmia or ischemia.  Fluid resuscitation begun.  Urinalysis with moderate leuk esterase, 11-20 white blood cells and rare bacteria but there is budding yeast.  She is not having any urinary symptoms at this time and thus we will culture the urine for bacteria and treat with half dose fluconazole given her drop in creatinine clearance.  Chest x-ray showing developing left lower lobe infiltrates and patient is scheduled for high-resolution CT chest in July.  I confirmed with pulmonology that this would be the appropriate test that they would like in the setting of her ILD and a high-resolution CT chest was obtained in the ER today that shows coarse interstitial infiltrates indicating usual interstitial pneumonitis with scarring and improvement of the groundglass pattern seen previously.  Orthostatic vital signs performed the emergency department and she did not display any hypotension or lightheadedness here in the ER.  I did offer admission for rehydration in the setting of an acute kidney injury but patient would like to be discharged and follow-up in the outpatient setting.  Patient states that she will call her primary care physician for a creatinine recheck and her pulmonologist to discuss the CT findings.  Thus, patient discharged with outpatient follow-up and strict return precautions given which she voiced understanding   Additional history obtained: -Additional history  obtained from husband -External records from outside source obtained and reviewed including: Chart review including previous notes, labs, imaging, consultation notes   Lab  Tests: -I ordered, reviewed, and interpreted labs.   The pertinent results include:   Labs Reviewed  BASIC METABOLIC PANEL WITH GFR - Abnormal; Notable for the following components:      Result Value   Sodium 133 (*)    CO2 20 (*)    Glucose, Bld 114 (*)    Creatinine, Ser 2.11 (*)    Calcium  8.6 (*)    GFR, Estimated 23 (*)    All other components within normal limits  CBC WITH DIFFERENTIAL/PLATELET - Abnormal; Notable for the following components:   RBC 3.24 (*)    Hemoglobin 10.5 (*)    HCT 32.5 (*)    MCV 100.3 (*)    All other components within normal limits  BRAIN NATRIURETIC PEPTIDE  URINALYSIS, ROUTINE W REFLEX MICROSCOPIC  LACTIC ACID, PLASMA  LACTIC ACID, PLASMA      EKG   EKG Interpretation Date/Time:  Wednesday April 03 2024 11:32:32 EDT Ventricular Rate:  79 PR Interval:  182 QRS Duration:  88 QT Interval:  406 QTC Calculation: 465 R Axis:   21  Text Interpretation: Normal sinus rhythm Cannot rule out Anterior infarct , age undetermined When compared with ECG of 07-Feb-2024 15:51, PREVIOUS ECG IS PRESENT Confirmed by Jerald Molly [phone]) on 04/03/2024 12:00:09 PM         Imaging Studies ordered: I ordered imaging studies including chest x-ray, CT chest I independently visualized and interpreted imaging. I agree with the radiologist interpretation   Medicines ordered and prescription drug management: Meds ordered this encounter  Medications   lactated ringers  bolus 1,000 mL    -I have reviewed the patients home medicines and have made adjustments as needed  Critical interventions none  Consultations Obtained: I requested consultation with the pulmonology team on-call,  and discussed lab and imaging findings as well as pertinent plan - they recommend: High-resolution CT chest   Cardiac Monitoring: The patient was maintained on a cardiac monitor.  I personally viewed and interpreted the cardiac monitored which showed an underlying rhythm of:  NSR  Social Determinants of Health:  Factors impacting patients care include: none   Reevaluation: After the interventions noted above, I reevaluated the patient and found that they have :improved  Co morbidities that complicate the patient evaluation  Past Medical History:  Diagnosis Date   Anemia    Anginal pain (HCC)    Anxiety    Arthritis    "feet, knees, back" (05/02/2016)   Asthma    CAD (coronary artery disease)    a. 04/2016: NSTEMI 95% stenosis 1st Mrg (s/p DES)   Chest pain    a. 10/2013 Exercise Myoview : Ef 65%, no ischemia.   CHF (congestive heart failure) (HCC)    Chronic bronchitis (HCC)    Chronic lower back pain    COPD (chronic obstructive pulmonary disease) (HCC)    GERD (gastroesophageal reflux disease)    History of blood transfusion 1960s   "related to my hysterectomy"   Hypercholesterolemia    Hypertension    Lower extremity edema    Mild aortic stenosis 03/07/2023   TTE 02/28/2023: EF 55-60, no RWMA, mild asymmetric LVH, NL RVSF, mild MR, mild aortic stenosis (bulky subvalvular calcification), mean AV gradient 9.6, V-max 199 cm/s, RAP 3   Mitral valve prolapse    NSTEMI (non-ST elevated myocardial infarction) (HCC) 05/01/2016  Pneumonia 05/01/2016   "saw trace of slight pneumonia/CT scan" (05/02/2016)   Seasonal allergies    Sjogren's syndrome (HCC)    Wheezing       Dispostion: I considered admission for this patient, but at this time she does not meet inpatient criteria for admission and will be discharged with outpatient follow-up.     Final Clinical Impression(s) / ED Diagnoses Final diagnoses:  None     @PCDICTATION @    Karlyn Overman, MD 04/03/24 1954

## 2024-04-03 NOTE — ED Triage Notes (Signed)
 Patient sent here by PCP for hypotension.  Patient endorses generalized fatigue and lightheadedness.  Patient gives verbal consent for MSE.

## 2024-04-03 NOTE — ED Provider Triage Note (Signed)
 Emergency Medicine Provider Triage Evaluation Note  Donna Williamson , a 78 y.o. female  was evaluated in triage.  Pt complains of low BP.  Seen by rheumatologist today and BP 90/60.  Typical SBP 120 mmhg.  Doctor was concerned because patient had stopped "cold [place]" long-term prednisone  last week.  Hx of ILD  Patient reports fatigue, some lightheadedness  Review of Systems  Positive: SOB, Lightheadedness Negative: Fevers  Physical Exam  BP 115/60   Pulse 76   Temp 98 F (36.7 C) (Oral)   Resp 20   Wt 99.3 kg   SpO2 94%   BMI 36.43 kg/m  Gen:   Awake, no distress   Resp:  Normal effort  MSK:   Moves extremities without difficulty    Medical Decision Making  Medically screening exam initiated at 11:11 AM.  Appropriate orders placed.  KHAYLEE MCEVOY was informed that the remainder of the evaluation will be completed by another provider, this initial triage assessment does not replace that evaluation, and the importance of remaining in the ED until their evaluation is complete.  Labs, ecg ordered BP normal in triage   Arvilla Birmingham, MD 04/03/24 1113

## 2024-04-04 LAB — URINE CULTURE: Culture: NO GROWTH

## 2024-04-05 DIAGNOSIS — N1832 Chronic kidney disease, stage 3b: Secondary | ICD-10-CM | POA: Diagnosis not present

## 2024-04-05 DIAGNOSIS — I959 Hypotension, unspecified: Secondary | ICD-10-CM | POA: Diagnosis not present

## 2024-04-05 DIAGNOSIS — J8410 Pulmonary fibrosis, unspecified: Secondary | ICD-10-CM | POA: Diagnosis not present

## 2024-04-05 DIAGNOSIS — I5032 Chronic diastolic (congestive) heart failure: Secondary | ICD-10-CM | POA: Diagnosis not present

## 2024-04-05 DIAGNOSIS — J84112 Idiopathic pulmonary fibrosis: Secondary | ICD-10-CM | POA: Diagnosis not present

## 2024-04-05 DIAGNOSIS — I130 Hypertensive heart and chronic kidney disease with heart failure and stage 1 through stage 4 chronic kidney disease, or unspecified chronic kidney disease: Secondary | ICD-10-CM | POA: Diagnosis not present

## 2024-04-05 DIAGNOSIS — F19930 Other psychoactive substance use, unspecified with withdrawal, uncomplicated: Secondary | ICD-10-CM | POA: Diagnosis not present

## 2024-04-05 DIAGNOSIS — N179 Acute kidney failure, unspecified: Secondary | ICD-10-CM | POA: Diagnosis not present

## 2024-04-06 DIAGNOSIS — J189 Pneumonia, unspecified organism: Secondary | ICD-10-CM | POA: Diagnosis not present

## 2024-04-08 ENCOUNTER — Telehealth: Payer: Self-pay | Admitting: *Deleted

## 2024-04-08 NOTE — Telephone Encounter (Signed)
 Copied from CRM [id number]. Topic: Appointments - Scheduling Inquiry for Clinic >> Apr 05, 2024 10:32 AM Ilene Malling wrote: Reason for CRM: Patient [phone] needs to have blood work done today. Informed patient, the lab is open until noon today. Patient will come in on Monday to have labs done, cannot come in today before noon.   Patient states Dr. Bertrum Brodie advised patient to f/u in July after CT scan and lab results. Patient already had CT scan done at [facility]. Unable to schedule due to [employer] is showing no solution found without overruling. Patient verbalized understanding, and will wait for the call back. Please advise and call back patient to schedule an appointment.  Routing to [employer] for ov with MR to be scheduled. Thanks.

## 2024-04-08 NOTE — Telephone Encounter (Signed)
Patient has been scheduled for July.

## 2024-04-13 DIAGNOSIS — J189 Pneumonia, unspecified organism: Secondary | ICD-10-CM | POA: Diagnosis not present

## 2024-04-15 ENCOUNTER — Other Ambulatory Visit (HOSPITAL_COMMUNITY): Payer: Self-pay | Admitting: *Deleted

## 2024-04-16 ENCOUNTER — Ambulatory Visit (HOSPITAL_COMMUNITY)
Admission: RE | Admit: 2024-04-16 | Discharge: 2024-04-16 | Disposition: A | Discharge status: Home / Self Care | Payer: Medicare PPO | Source: Ambulatory Visit | Attending: Internal Medicine | Admitting: Internal Medicine

## 2024-04-16 DIAGNOSIS — E785 Hyperlipidemia, unspecified: Secondary | ICD-10-CM | POA: Diagnosis not present

## 2024-04-16 MED ORDER — INCLISIRAN SODIUM 284 MG/1.5ML ~~LOC~~ SOSY
284.0000 mg | PREFILLED_SYRINGE | SUBCUTANEOUS | Status: DC
  Administered 2024-04-16: 284 mg via SUBCUTANEOUS

## 2024-04-16 MED ORDER — INCLISIRAN SODIUM 284 MG/1.5ML ~~LOC~~ SOSY
PREFILLED_SYRINGE | SUBCUTANEOUS | Status: AC
  Filled 2024-04-16: qty 1.5

## 2024-04-18 ENCOUNTER — Other Ambulatory Visit: Payer: Self-pay

## 2024-04-18 NOTE — Patient Outreach (Signed)
 Complex Care Management   Visit Note  04/18/2024  Name:  Donna Williamson MRN: [id number] DOB: 1946-06-08  Situation: Referral received for Complex Care Management related to Asthma I obtained verbal consent from Patient.  Visit completed with patient  on the phone  Assessment: Patient reports she lost her mother last week and request RNCM reschedule appointment. She states she has support from family counseling offered by Hospice services if needed-Without any health questions or concerns at this time.   Follow Up Plan:   Telephone follow up appointment date/time:  04/29/24 at 11:00 am  Lindi Revering, RN, MSN, BSN, CCM [place]  [facility], Population Health Case Manager Phone: [phone]

## 2024-04-24 ENCOUNTER — Telehealth: Payer: Self-pay | Admitting: Podiatry

## 2024-04-24 NOTE — Telephone Encounter (Signed)
 Called patient and left messages on both numbers on file to reschedule her July 3rd appointment due to doctor not being in office.

## 2024-04-25 DIAGNOSIS — R051 Acute cough: Secondary | ICD-10-CM | POA: Diagnosis not present

## 2024-04-25 DIAGNOSIS — J84112 Idiopathic pulmonary fibrosis: Secondary | ICD-10-CM | POA: Diagnosis not present

## 2024-04-25 DIAGNOSIS — J8410 Pulmonary fibrosis, unspecified: Secondary | ICD-10-CM | POA: Diagnosis not present

## 2024-04-25 DIAGNOSIS — M3500 Sicca syndrome, unspecified: Secondary | ICD-10-CM | POA: Diagnosis not present

## 2024-04-25 DIAGNOSIS — J309 Allergic rhinitis, unspecified: Secondary | ICD-10-CM | POA: Diagnosis not present

## 2024-04-25 DIAGNOSIS — R0602 Shortness of breath: Secondary | ICD-10-CM | POA: Diagnosis not present

## 2024-04-25 DIAGNOSIS — I5032 Chronic diastolic (congestive) heart failure: Secondary | ICD-10-CM | POA: Diagnosis not present

## 2024-04-29 ENCOUNTER — Telehealth: Payer: Self-pay

## 2024-05-02 ENCOUNTER — Ambulatory Visit: Payer: Medicare PPO | Admitting: Podiatry

## 2024-05-06 ENCOUNTER — Other Ambulatory Visit

## 2024-05-06 DIAGNOSIS — J189 Pneumonia, unspecified organism: Secondary | ICD-10-CM | POA: Diagnosis not present

## 2024-05-13 DIAGNOSIS — J189 Pneumonia, unspecified organism: Secondary | ICD-10-CM | POA: Diagnosis not present

## 2024-05-21 NOTE — Progress Notes (Unsigned)
 OV 07/27/2023  Subjective:  Patient ID: Donna Williamson, female , DOB: April 14, 1946 , age 78 y.o. , MRN: [id number] , ADDRESS: [address] [place] [address] PCP Shayne Anes, MD Patient Care Team: Shayne Anes, MD as PCP - General (Internal Medicine) Wonda Sharper, MD as PCP - Cardiology (Cardiology)  This Provider for this visit: Treatment Team:  Attending Provider: Darlean Sharper NOVAK, MD    07/27/2023 -   Chief Complaint  Patient presents with   Pulmonary Consult    Referred by Dr. Anes Shayne. Pt c/o DOE over many years, worse x 2 years. She gets winded most anything I do.  She has cough in the am- prod with white to yellow sputum, then dry cough throughout the day.      HPI Donna Williamson 78 y.o. -presents with her husband.  Husband is an independent historian.  The concern is for interstitial lung disease.  Is a 78 year old female suffers from obesity.  She says in 2017 she had a myocardial infarction.  I visualized the CT scan of the chest from the time she did have pleural effusion it was a angiogram protocol to rule out pulmonary embolism which she did rule out.  She had pleural effusions and bilateral lower lobe inflammation at that time..  She states after that she felt completely like a new person.  Then appears in 2019 or 2020 she did have stents and angioplasty but was doing fairly okay but then in the last few years has worsening insidious onset of shortness of breath that is slowly getting worse present with exertion relieved by rest.  She believes she might be desaturating [walk 200 feet here and pulse ox went down to 90%].  She says just walking from her kitchen to the living room makes her shortness of breath come on.  She is also got nonproductive cough indigestion and sometimes sore throat.  In early 2024 she was told she had pneumonia with scar tissue then by July 2024 chest x-ray showed resolution of the pneumonia but the scar tissue was still there  but then they did a high-resolution CT chest on her insistence.  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  There is reticulation there is no honeycombing.  Definite craniocaudal appearance but it seems more postinflammatory.  She is known to suffer from diastolic heart failure she has multiple episodes of high BNP.  She is managed by Dr. Wonda  She also sees Dr. Randine Bihari for sleep apnea and is using CPAP.    CT Chest data from date: *718/24  - personally visualized and independently interpreted : ys - my findings are: as dictated with Narrative & Impression  CLINICAL DATA:  Abnormal chest radiograph   EXAM: CT CHEST WITHOUT CONTRAST   TECHNIQUE: Multidetector CT imaging of the chest was performed following the standard protocol without IV contrast.   RADIATION DOSE REDUCTION: This exam was performed according to the departmental dose-optimization program which includes automated exposure control, adjustment of the mA and/or kV according to patient size and/or use of iterative reconstruction technique.   COMPARISON:  None Available.   FINDINGS: Cardiovascular: Aortic atherosclerosis. Mild cardiomegaly. Extensive three-vessel coronary artery calcifications no pericardial effusion.   Mediastinum/Nodes: No enlarged mediastinal, hilar, or axillary lymph nodes. Thyroid gland, trachea, and esophagus demonstrate no significant findings.   Lungs/Pleura: Mild pulmonary fibrosis in a pattern with apical to basal gradient, however  with heterogeneous areas involvement, for example in the right upper lobe, featuring irregular peripheral interstitial opacity, septal thickening, mild traction bronchiectasis in the lung bases, as well as small areas of subpleural bronchiolectasis without clear evidence of honeycombing. No pleural effusion or pneumothorax.   Upper Abdomen: No acute abnormality.    Musculoskeletal: No chest wall abnormality. No acute osseous findings.   IMPRESSION: 1. Mild pulmonary fibrosis in a pattern with apical to basal gradient, however with heterogeneous areas involvement, featuring irregular peripheral interstitial opacity, septal thickening, mild traction bronchiectasis in the lung bases, as well as small areas of subpleural bronchiolectasis without clear evidence of honeycombing. 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 Am JINNY [employer] Crit Care Med Vol 198, Iss 5, [phone], Jul 01 2017. 2. Coronary artery disease. 3. Aortic atherosclerosis.   Aortic Atherosclerosis (ICD10-I70.0).     Electronically Signed   By: Marolyn JONETTA Jaksch M.D.   On: 05/18/2023 15:16     OV 10/02/2023  Subjective:  Patient ID: Donna Williamson, female , DOB: 1945/11/05 , age 11 y.o. , MRN: [id number] , ADDRESS: [address] [place] [address] PCP Shayne Anes, MD Patient Care Team: Shayne Anes, MD as PCP - General (Internal Medicine) Wonda Sharper, MD as PCP - Cardiology (Cardiology)  This Provider for this visit: Treatment Team:  Attending Provider: Geronimo Amel, MD    10/02/2023 -   Chief Complaint  Patient presents with   Follow-up    SOB with exertion and occ prod cough with clear sputum.    #Interstitial lung disease -workup in progress HPI Donna Williamson 77 y.o. -presents with her husband here to discuss test results.  In the interim on 09/18/2023 did see Dr. Lonni Poli for pain in the right knee.  She took a week of prednisone  that is completing today.  This might of helped her shortness of breath but otherwise no overall change no hospitalizations no ER visits no change in overall symptoms.  She did complete the ILD questionnaire and also the blood work and the results are as follows     Development worker, community ILD Questionnaire  Symptoms:   Past Medical History :  -She has been on Symbicort for unclear reasons.  She says she has a diagnosis of COPD since 2017 although the CT chest itself does not show that.  She is also seeing allergist Dr. Frutoso.  Symbicort was started by primary care.  She says allergy test was negative.   That she has diastolic heart failure and she sees cardiology for this  -Actually on today that she has Sjogren's disease since 33s.  She has dry eyes for which she has had lacrimal duct surgery.  She also dry mouth is a chronic issue.  She does not have a rheumatologist but she does see [facility].   - Does have a history of sleep apnea   - Denies any pulmonary hypertension.  No diabetes no thyroid disease no stroke no tuberculosis no kidney disease   -She did have COVID-vaccine.  She has had COVID disease 3 years ago.  ROS:  -Sicca symptoms present.  She uses lemon drops and teardrops  FAMILY HISTORY of LUNG DISEASE:  = She has family history of COPD.  Asthma in the father and COPD in the father.  PERSONAL EXPOSURE HISTORY:  -She got exposed to passive smoking by her ex-husband.  Unclear duration.  No vaping  marijuana no cocaine no intravenous drug use  HOME  EXPOSURE and HOBBY DETAILS :  -[place] last 21 years.  Age of the home was built in 15.  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  Has worked in Database administrator.  Otherwise detail organic and inorganic antigen exposure history is negative  PULMONARY TOXICITY HISTORY (27 items):  Negative  INVESTIGATIONS: Strongly positive for ANA and SSA consistent with Sjogren's  High-resolution CT chest again which personally visualized.  This time I Agree there is probable UIP versus  indeterminate.  But there is definite ILD pattern there.   PFT with slight reduction in DLCO only.  OV 04/02/2024  Subjective:  Patient ID: Donna Williamson, female , DOB: 08-06-1946 , age 23 y.o. , MRN: [id number] , ADDRESS: [address] [place] [address] PCP Shayne Anes, MD Patient Care Team: Shayne Anes, MD as PCP - General (Internal Medicine) Wonda Sharper, MD as PCP - Cardiology (Cardiology)  This Provider for this visit: Treatment Team:  Attending Provider: Geronimo Amel, MD    04/02/2024 -   Chief Complaint  Patient presents with   dyspnea on exertion   #Interstitial lung disease with Sjogren antibody positive.  HPI Donna Williamson 78 y.o. -returns for follow-up.  Last seen in December 2024.  At that point we resolved that she would see rheumatology and then come back.  Her ILD burden on the CT scan pulmonary function test was mild.  She tells me in the interim in March 2025 she started taking care of her sick mother rigoberto is currently in hospice and dying].  She was spending many days or even weeks in March 2025 in the hospital.  Then in April 2025 she got hospitalized herself with acute hypoxemic respiratory failure with worsening of groundglass opacities and infiltrates.  Chest x-ray showed unilateral lobar.  She believes she had pneumonia but review of the external medical record shows that her procalcitonin was normal.  Respiratory virus panel was negative.  Pulmonary consult by Dr. Meade considered ILD flareup.  She was discharged on steroids and antibiotics.  She says she took a 45-day course of steroids and finished it yesterday.  She was discharged on 2 L oxygen  at rest and 4 L with exertion.  Currently she says she is improved enough that she does not need oxygen  at rest but she does desaturate when she does exertions.  She is able to do activities of daily living.  She finished her steroids last week.  She is desperate to come off the oxygen .  She  is also in grief because her mother's passing away.  I personally visualized the hospital CT and agree with the ILD flare diagnosis.  She is going to see Dr. Lonni Ester tomorrow [this would be the first rheumatology consultants taken this long].  Hospital labs also show anemia and elevated BUN.  We wanted to test exercise hypoxemia test today but her systolic was 88.  She is on multiple antihypertensive drugs.  Therefore we decided not to do it.  She is feeling well.  Advised her to go to the ER or seek primary care physician help.     OV 05/21/2024  Subjective:  Patient ID: Donna Williamson, female , DOB: February 11, 1946 , age 51 y.o. , MRN: [id number] , ADDRESS: [phone]  [address] [place] [address] PCP Shayne Anes, MD Patient Care Team: Shayne Anes, MD as PCP - General (Internal Medicine) Wonda Sharper, MD as PCP - Cardiology (Cardiology) Prentiss Heddy HERO, RN as [facility] Care Management  This Provider for this visit: Treatment Team:  Attending Provider: Geronimo Amel, MD    05/21/2024 -  No chief complaint on file.    HPI Donna Williamson 78 y.o. -    x SYMPTOM SCALE - ILD 10/02/2023   Current weight    O2 use ra   Shortness of Breath 0 -> 5 scale with 5 being worst (score 6 If unable to do)   At rest 0   Simple tasks - showers, clothes change, eating, shaving 4   Household (dishes, doing bed, laundry) 3   Shopping 3   Walking level at own pace 2   Walking up Stairs 5   Total (30-36) Dyspnea Score 17       Non-dyspnea symptoms (0-> 5 scale) 10/02/2023   How bad is your cough? 0   How bad is your fatigue 3   How bad is nausea 0   How bad is vomiting?  0   How bad is diarrhea? 0   How bad is anxiety? 00   How bad is depression 0   Any chronic pain - if so where and how bad 0      CT Chest data from date: ****  - personally visualized and independently interpreted : *** - my findings are: ***   PFT     Latest Ref Rng & Units 03/15/2024    10:17 AM 09/27/2023   12:50 PM  PFT Results  FVC-Pre L 2.45  2.46   FVC-Predicted Pre % 87  86   FVC-Post L  2.50   FVC-Predicted Post %  88   Pre FEV1/FVC % % 78  78   Post FEV1/FCV % %  79   FEV1-Pre L 1.90  1.92   FEV1-Predicted Pre % 91  90   FEV1-Post L  1.98   DLCO uncorrected ml/min/mmHg 10.97  11.70   DLCO UNC% % 55  59   DLCO corrected ml/min/mmHg 12.45  11.70   DLCO COR %Predicted % 63  59   DLVA Predicted % 79  74   TLC L  4.82   TLC % Predicted %  91   RV % Predicted %  98        LAB RESULTS last 96 hours No results found.       has a past medical history of Anemia, Anginal pain (HCC), Anxiety, Arthritis, Asthma, CAD (coronary artery disease), Chest pain, CHF (congestive heart failure) (HCC), Chronic bronchitis (HCC), Chronic lower back pain, COPD (chronic obstructive pulmonary disease) (HCC), GERD (gastroesophageal reflux disease), History of blood transfusion (1960s), Hypercholesterolemia, Hypertension, Lower extremity edema, Mild aortic stenosis (03/07/2023), Mitral valve prolapse, NSTEMI (non-ST elevated myocardial infarction) (HCC) (05/01/2016), Pneumonia (05/01/2016), Seasonal allergies, Sjogren's syndrome (HCC), and Wheezing.   reports that she has never smoked. She has been exposed to tobacco smoke. She has never used smokeless tobacco.  Past Surgical History:  Procedure Laterality Date   ABDOMINAL HYSTERECTOMY  1960s   partial   CARDIAC CATHETERIZATION N/A 05/02/2016   Procedure: Right/Left Heart Cath and Coronary Angiography;  Surgeon: Candyce GORMAN Reek, MD;  Location: [facility] INVASIVE CV LAB;  Service: Cardiovascular;  Laterality: N/A;   CARDIAC CATHETERIZATION N/A 05/02/2016   Procedure: Coronary Stent Intervention;  Surgeon: Candyce GORMAN Reek,  MD;  Location: [facility] INVASIVE CV LAB;  Service: Cardiovascular;  Laterality: N/A;   CATARACT EXTRACTION W/PHACO Right 09/20/2019   Procedure: CATARACT EXTRACTION PHACO AND INTRAOCULAR LENS PLACEMENT (IOC) RIGHT;   Surgeon: Jaye Fallow, MD;  Location: [facility] ORS;  Service: Ophthalmology;  Laterality: Right;  US  01:29.4 CDE 19.28 FLUID PACK LOT # [id number]   CATARACT EXTRACTION W/PHACO Left 10/15/2019   Procedure: CATARACT EXTRACTION PHACO AND INTRAOCULAR LENS PLACEMENT (IOC) LEFT 8.76, 00:48.8;  Surgeon: Jaye Fallow, MD;  [facility];  Service: Ophthalmology;  Laterality: Left;   COLONOSCOPY W/ BIOPSIES AND POLYPECTOMY     CORONARY ANGIOPLASTY WITH STENT PLACEMENT  05/02/2016   1 stent   CORONARY PRESSURE/FFR STUDY N/A 02/17/2021   Procedure: INTRAVASCULAR PRESSURE WIRE/FFR STUDY;  Surgeon: Verlin Lonni BIRCH, MD;  Location: [facility] INVASIVE CV LAB;  Service: Cardiovascular;  Laterality: N/A;   CORONARY STENT INTERVENTION N/A 02/17/2021   Procedure: CORONARY STENT INTERVENTION;  Surgeon: Verlin Lonni BIRCH, MD;  Location: [facility] INVASIVE CV LAB;  Service: Cardiovascular;  Laterality: N/A;   ESOPHAGOGASTRODUODENOSCOPY     JOINT REPLACEMENT Left 2007   hip   LAPAROSCOPIC SALPINGOOPHERECTOMY Bilateral ~ 1990   LEFT HEART CATH AND CORONARY ANGIOGRAPHY N/A 02/17/2021   Procedure: LEFT HEART CATH AND CORONARY ANGIOGRAPHY;  Surgeon: Verlin Lonni BIRCH, MD;  Location: [facility] INVASIVE CV LAB;  Service: Cardiovascular;  Laterality: N/A;   TOTAL HIP ARTHROPLASTY Left 2007    Allergies  Allergen Reactions   Levofloxacin Anaphylaxis   Plavix  [Clopidogrel  Bisulfate] Rash   Ciprofibrate     Other reaction(s): swelling, muscle aches   Codeine Nausea Only   Drug Ingredient [Ticagrelor ] Other (See Comments)    Patient reports hemorrhage after medical trial of brilinta  in 2017   Nitrofurantoin  Other (See Comments)    VISION CHANGES   Ciprofloxacin Rash    Other Reaction(s): nausea and vomiting   Penicillins Rash   Sulfa Antibiotics Rash    Immunization History  Administered Date(s) Administered   Influenza-Unspecified 07/01/2022, 07/02/2023   Pneumococcal-Unspecified 07/01/2014,  04/30/2022   Respiratory Syncytial Virus Vaccine,Recomb Aduvanted(Arexvy) 08/31/2022   Td 09/10/2019   Tdap 08/23/2015   Unspecified SARS-COV-2 Vaccination 11/25/2019, 12/16/2019, 08/01/2020, 07/16/2021, 04/07/2022, 07/31/2022   Zoster Recombinant(Shingrix) 01/31/2018, 05/07/2018    Family History  Problem Relation Age of Onset   Hypertension Mother    Stroke Mother    Breast cancer Mother    Heart disease Father    Emphysema Father        smoked   Asthma Father    Ovarian cancer Sister      Current Outpatient Medications:    acetaminophen  (TYLENOL ) 500 MG tablet, Take 500-1,000 mg by mouth every 6 (six) hours as needed for moderate pain (pain score 4-6)., Disp: , Rfl:    albuterol  (PROVENTIL ) (2.5 MG/3ML) 0.083% nebulizer solution, Take 3 mLs (2.5 mg total) by nebulization every 2 (two) hours as needed for wheezing or shortness of breath., Disp: 75 mL, Rfl: 2   ALPRAZolam  (XANAX ) 0.5 MG tablet, Take 0.5 mg by mouth daily as needed for anxiety. , Disp: , Rfl:    aspirin  EC 81 MG tablet, Take 81 mg by mouth daily., Disp: , Rfl:    benzonatate  (TESSALON ) 200 MG capsule, Take 200 mg by mouth 3 (three) times daily as needed for cough., Disp: , Rfl:    budesonide-formoterol  (SYMBICORT) 160-4.5 MCG/ACT inhaler, Inhale 2 puffs into the lungs 2 (two) times daily., Disp: , Rfl:    carboxymethylcellulose (REFRESH PLUS)  0.5 % SOLN, Place 1 drop into both eyes daily as needed (dry eyes)., Disp: , Rfl:    Cholecalciferol  (VITAMIN D3) 2000 UNITS TABS, Take 2,000 Units by mouth daily., Disp: , Rfl:    cyanocobalamin  500 MCG tablet, Take 1,000 mcg by mouth daily., Disp: , Rfl:    dextromethorphan -guaiFENesin  (MUCINEX  DM) 30-600 MG 12hr tablet, Take 1 tablet by mouth 2 (two) times daily. (Patient not taking: Reported on 04/03/2024), Disp: 30 tablet, Rfl: 0   diltiazem  (CARDIZEM  CD) 120 MG 24 hr capsule, Take 120 mg by mouth daily. (Patient not taking: Reported on 04/03/2024), Disp: , Rfl:    EPIPEN  2-PAK  0.3 MG/0.3ML SOAJ injection, Inject 0.3 mg into the skin daily as needed (allergic reaction). , Disp: , Rfl:    estradiol  (ESTRACE ) 0.5 MG tablet, Take 0.5 mg by mouth daily., Disp: , Rfl:    ezetimibe  (ZETIA ) 10 MG tablet, Take 10 mg by mouth at bedtime., Disp: , Rfl:    FLUoxetine  (PROZAC ) 20 MG capsule, Take 20 mg by mouth daily., Disp: , Rfl:    fluticasone  (FLONASE ) 50 MCG/ACT nasal spray, Place 2 sprays into the nose daily., Disp: , Rfl:    furosemide  (LASIX ) 20 MG tablet, Take 1 tablet (20 mg total) by mouth daily., Disp: 90 tablet, Rfl: 3   gabapentin  (NEURONTIN ) 100 MG capsule, Take 100 mg by mouth daily., Disp: , Rfl:    gabapentin  (NEURONTIN ) 300 MG capsule, Take 300 mg by mouth at bedtime. (Patient not taking: Reported on 04/03/2024), Disp: , Rfl:    inclisiran (LEQVIO ) 284 MG/1.5ML SOSY injection, Inject 284 mg into the skin once., Disp: , Rfl:    isosorbide  mononitrate (IMDUR ) 30 MG 24 hr tablet, Take 1 tablet (30 mg total) by mouth daily., Disp: 90 tablet, Rfl: 1   metoprolol  tartrate (LOPRESSOR ) 25 MG tablet, TAKE 1/2 TABLET(12.5 MG) BY MOUTH TWICE DAILY, Disp: 90 tablet, Rfl: 3   montelukast  (SINGULAIR ) 10 MG tablet, Take 10 mg by mouth at bedtime., Disp: , Rfl:    Multiple Vitamin (MULTIVITAMIN WITH MINERALS) TABS tablet, Take 1 tablet by mouth daily., Disp: , Rfl:    nitroGLYCERIN  (NITROSTAT ) 0.4 MG SL tablet, Place 1 tablet (0.4 mg total) under the tongue every 5 (five) minutes as needed for chest pain (up to 3 doses. If taking 3rd dose call 911)., Disp: 25 tablet, Rfl: 3   pantoprazole  (PROTONIX ) 40 MG tablet, TAKE 2 TABLETS(80 MG) BY MOUTH DAILY, Disp: 180 tablet, Rfl: 3   potassium chloride  (KLOR-CON ) 10 MEQ tablet, Take 10 mEq by mouth See admin instructions. Take 1 tablet (10 mEq) by mouth on Monday's and Thursday's., Disp: , Rfl:    predniSONE  (DELTASONE ) 20 MG tablet, Take 2 tablets (40 mg total) by mouth daily with breakfast. (Patient not taking: Reported on 04/03/2024), Disp:  90 tablet, Rfl: 0   PROAIR  HFA 108 (90 BASE) MCG/ACT inhaler, Inhale 1-2 puffs into the lungs every 4 (four) hours as needed for wheezing., Disp: , Rfl:    rosuvastatin  (CRESTOR ) 20 MG tablet, Take 1 tablet (20 mg total) by mouth daily at 6 PM., Disp: 30 tablet, Rfl: 0      Objective:   There were no vitals filed for this visit.  Estimated body mass index is 36.43 kg/m as calculated from the following:   Height as of 04/03/24: 5' 5 (1.651 m).   Weight as of 04/03/24: 99.3 kg.  @WEIGHTCHANGE @  There were no vitals filed for this visit.   Physical Exam  General: No distress. *** O2 at rest: *** Cane present: *** Sitting in wheel chair: *** Frail: *** Obese: *** Neuro: Alert and Oriented x 3. GCS 15. Speech normal Psych: Pleasant Resp:  Barrel Chest - ***.  Wheeze - ***, Crackles - ***, No overt respiratory distress CVS: Normal heart sounds. Murmurs - *** Ext: Stigmata of Connective Tissue Disease - *** HEENT: Normal upper airway. PEERL +. No post nasal drip        Assessment/PLAN     Assessment & Plan      FOLLOWUP    No follow-ups on file.    SIGNATURE    Dr. Dorethia Cave, M.D., F.C.C.P,  Pulmonary and Critical Care Medicine Staff Physician, [facility] Director - Interstitial Lung Disease  Program  Pulmonary Fibrosis [facility] at [facility] [place], [place], [address]  Pager: [phone], If no answer or between  15:00h - 7:00h: call [phone] Telephone: [phone]  6:32 PM 05/21/2024   Moderate Complexity MDM OFFICE  2021 E/M guidelines, first released in 2021, with minor revisions added in 2023 and 2024 Must meet the requirements for 2 out of 3 dimensions to qualify.    Number and complexity of problems addressed Amount and/or complexity of data reviewed Risk of complications and/or morbidity  One or more chronic illness with mild exacerbation, OR progression, OR  side effects of  treatment  Two or more stable chronic illnesses  One undiagnosed new problem with uncertain prognosis  One acute illness with systemic symptoms   One Acute complicated injury Must meet the requirements for 1 of 3 of the categories)  Category 1: Tests and documents, historian  Any combination of 3 of the following:  Assessment requiring an independent historian  Review of prior external note(s) from each unique source  Review of results of each unique test  Ordering of each unique test    Category 2: Interpretation of tests   Independent interpretation of a test performed by another physician/other qualified health care professional (not separately reported)  Category 3: Discuss management/tests  Discussion of management or test interpretation with external physician/other qualified health care professional/appropriate source (not separately reported) Moderate risk of morbidity from additional diagnostic testing or treatment Examples only:  Prescription drug management  Decision regarding minor surgery with identfied patient or procedure risk factors  Decision regarding elective major surgery without identified patient or procedure risk factors  Diagnosis or treatment significantly limited by social determinants of health             HIGh Complexity  OFFICE   2021 E/M guidelines, first released in 2021, with minor revisions added in 2023. Must meet the requirements for 2 out of 3 dimensions to qualify.    Number and complexity of problems addressed Amount and/or complexity of data reviewed Risk of complications and/or morbidity  Severe exacerbation of chronic illness  Acute or chronic illnesses that may pose a threat to life or bodily function, e.g., multiple trauma, acute MI, pulmonary embolus, severe respiratory distress, progressive rheumatoid arthritis, psychiatric illness with potential threat to self or others, peritonitis, acute renal failure, abrupt  change in neurological status Must meet the requirements for 2 of 3 of the categories)  Category 1: Tests and documents, historian  Any combination of 3 of the following:  Assessment requiring an independent historian  Review of prior external note(s) from each unique source  Review of results of each unique test  Ordering  of each unique test    Category 2: Interpretation of tests    Independent interpretation of a test performed by another physician/other qualified health care professional (not separately reported)  Category 3: Discuss management/tests  Discussion of management or test interpretation with external physician/other qualified health care professional/appropriate source (not separately reported)  HIGH risk of morbidity from additional diagnostic testing or treatment Examples only:  Drug therapy requiring intensive monitoring for toxicity  Decision for elective major surgery with identified pateint or procedure risk factors  Decision regarding hospitalization or escalation of level of care  Decision for DNR or to de-escalate care   Parenteral controlled  substances            LEGEND - Independent interpretation involves the interpretation of a test for which there is a CPT code, and an interpretation or report is customary. When a review and interpretation of a test is performed and documented by the provider, but not separately reported (billed), then this would represent an independent interpretation. This report does not need to conform to the usual standards of a complete report of the test. This does not include interpretation of tests that do not have formal reports such as a complete blood count with differential and blood cultures. Examples would include reviewing a chest radiograph and documenting in the medical record an interpretation, but not separately reporting (billing) the interpretation of the chest radiograph.   An appropriate source  includes professionals who are not health care professionals but may be involved in the management of the patient, such as a Clinical research associate, upper officer, case manager or teacher, and does not include discussion with family or informal caregivers.    - SDOH: SDOH are the conditions in the environments where people are born, live, learn, work, play, worship, and age that affect a wide range of health, functioning, and quality-of-life outcomes and risks. (e.g., housing, food insecurity, transportation, etc.). SDOH-related Z codes ranging from Z55-Z65 are the ICD-10-CM diagnosis codes used to document SDOH data Z55 - Problems related to education and literacy Z56 - Problems related to employment and unemployment Z57 - Occupational exposure to risk factors Z58 - Problems related to physical environment Z59 - Problems related to housing and economic circumstances [phone] - Problems related to social environment [phone] - Problems related to upbringing [phone] - Other problems related to primary support group, including family circumstances Z18 - Problems related to certain psychosocial circumstances Z65 - Problems related to other psychosocial circumstances

## 2024-05-22 ENCOUNTER — Telehealth (HOSPITAL_BASED_OUTPATIENT_CLINIC_OR_DEPARTMENT_OTHER): Payer: Self-pay | Admitting: Cardiovascular Disease

## 2024-05-22 ENCOUNTER — Other Ambulatory Visit (INDEPENDENT_AMBULATORY_CARE_PROVIDER_SITE_OTHER)

## 2024-05-22 ENCOUNTER — Ambulatory Visit: Admitting: Internal Medicine

## 2024-05-22 ENCOUNTER — Encounter: Payer: Self-pay | Admitting: Internal Medicine

## 2024-05-22 ENCOUNTER — Telehealth: Payer: Self-pay | Admitting: Internal Medicine

## 2024-05-22 VITALS — BP 108/60 | HR 83 | Ht 65.0 in | Wt 211.0 lb

## 2024-05-22 DIAGNOSIS — Z862 Personal history of diseases of the blood and blood-forming organs and certain disorders involving the immune mechanism: Secondary | ICD-10-CM

## 2024-05-22 DIAGNOSIS — J8489 Other specified interstitial pulmonary diseases: Secondary | ICD-10-CM | POA: Diagnosis not present

## 2024-05-22 DIAGNOSIS — N179 Acute kidney failure, unspecified: Secondary | ICD-10-CM

## 2024-05-22 DIAGNOSIS — R0609 Other forms of dyspnea: Secondary | ICD-10-CM

## 2024-05-22 DIAGNOSIS — Z79899 Other long term (current) drug therapy: Secondary | ICD-10-CM

## 2024-05-22 DIAGNOSIS — M359 Systemic involvement of connective tissue, unspecified: Secondary | ICD-10-CM | POA: Diagnosis not present

## 2024-05-22 DIAGNOSIS — Z1159 Encounter for screening for other viral diseases: Secondary | ICD-10-CM

## 2024-05-22 DIAGNOSIS — R0902 Hypoxemia: Secondary | ICD-10-CM

## 2024-05-22 LAB — COMPREHENSIVE METABOLIC PANEL WITH GFR
ALT: 10 U/L (ref 0–35)
AST: 25 U/L (ref 0–37)
Albumin: 3.7 g/dL (ref 3.5–5.2)
Alkaline Phosphatase: 70 U/L (ref 39–117)
BUN: 16 mg/dL (ref 6–23)
CO2: 27 meq/L (ref 19–32)
Calcium: 9.1 mg/dL (ref 8.4–10.5)
Chloride: 104 meq/L (ref 96–112)
Creatinine, Ser: 1.25 mg/dL — ABNORMAL HIGH (ref 0.40–1.20)
GFR: 40.73 mL/min — ABNORMAL LOW (ref >60.00)
Glucose, Bld: 98 mg/dL (ref 70–99)
Potassium: 3.9 meq/L (ref 3.5–5.1)
Sodium: 139 meq/L (ref 135–145)
Total Bilirubin: 0.4 mg/dL (ref 0.2–1.2)
Total Protein: 6.4 g/dL (ref 6.0–8.3)

## 2024-05-22 LAB — CBC WITH DIFFERENTIAL/PLATELET
Basophils Absolute: 0.1 K/uL (ref 0.0–0.1)
Basophils Relative: 0.5 % (ref 0.0–3.0)
Eosinophils Absolute: 0.4 K/uL (ref 0.0–0.7)
Eosinophils Relative: 4.5 % (ref 0.0–5.0)
HCT: 32.4 % — ABNORMAL LOW (ref 36.0–46.0)
Hemoglobin: 10.5 g/dL — ABNORMAL LOW (ref 12.0–15.0)
Lymphocytes Relative: 36.4 % (ref 12.0–46.0)
Lymphs Abs: 3.6 K/uL (ref 0.7–4.0)
MCHC: 32.4 g/dL (ref 30.0–36.0)
MCV: 97.6 fl (ref 78.0–100.0)
Monocytes Absolute: 0.8 K/uL (ref 0.1–1.0)
Monocytes Relative: 8.2 % (ref 3.0–12.0)
Neutro Abs: 5 K/uL (ref 1.4–7.7)
Neutrophils Relative %: 50.4 % (ref 43.0–77.0)
Platelets: 305 K/uL (ref 150.0–400.0)
RBC: 3.32 Mil/uL — ABNORMAL LOW (ref 3.87–5.11)
RDW: 14.6 % (ref 11.5–15.5)
WBC: 9.9 K/uL (ref 4.0–10.5)

## 2024-05-22 LAB — BRAIN NATRIURETIC PEPTIDE: Pro B Natriuretic peptide (BNP): 337 pg/mL — ABNORMAL HIGH (ref 0.0–100.0)

## 2024-05-22 NOTE — Patient Instructions (Addendum)
 ICD-10-CM   1. Interstitial lung disease due to connective tissue disease (HCC)  J84.89 ECHOCARDIOGRAM COMPLETE   M35.9 CBC w/Diff    Comp Met (CMET)    B Nat Peptide    Pulmonary function test    Hepatitis C antibody    Hepatitis B surface antigen    Hepatitis B core antibody, IgM    2. Exercise hypoxemia  R09.02 ECHOCARDIOGRAM COMPLETE    CBC w/Diff    Comp Met (CMET)    B Nat Peptide    Pulmonary function test    3. DOE (dyspnea on exertion)  R06.09 ECHOCARDIOGRAM COMPLETE    CBC w/Diff    Comp Met (CMET)    B Nat Peptide    Pulmonary function test    4. Acute kidney injury (HCC)  N17.9 CBC w/Diff    Comp Met (CMET)    B Nat Peptide    Pulmonary function test    5. History of anemia  Z86.2 CBC w/Diff    Comp Met (CMET)    B Nat Peptide    Pulmonary function test    6. High risk medication use  Z79.899 Hepatitis C antibody    Hepatitis B surface antigen    Hepatitis B core antibody, IgM    7. Need for hepatitis B screening test  Z11.59 Hepatitis B surface antigen    Hepatitis B core antibody, IgM    8. Need for hepatitis C screening test  Z11.59 Hepatitis C antibody       #ILD #Dyspnea on exertion  #Exercise hypoxemia  -On pulmonary function test interstitial lung disease is stable between November 2024 and July 2025 despite hospitalization for respiratory failure in March 2025 - Glad you are back to baseline after the prolonged steroid therapy started in the hospital in spring 2025 - Nevertheless interstitial lung disease burden is significant as evidenced by exercise hypoxemia  Plan - Support starting CellCept recommended by Dr. Lonni Ester [I have reached out to him and the pharmacy team about this] - Immunomodulatory treatment would be the first step - After this if you are doing well and stable we can look at starting antifibrotic - Get echocardiogram [cancel the 1 in October we can get 1 done in the next few weeks]    Follow-up -6-week visit  with nurse practitioner  -12-week visit with Dr. Geronimo 30 minutes but after pulmonary function testing

## 2024-05-22 NOTE — Telephone Encounter (Signed)
 Left message for pt to call.

## 2024-05-22 NOTE — Telephone Encounter (Signed)
 Chris/Devki  Donna Williamson - support starting cellcept as you outlined Jun 2025 vist. She has ILD and I having exercise hypoxemia  THanks    SIGNATURE    Dr. Dorethia Cave, M.D., F.C.C.P,  Pulmonary and Critical Care Medicine Staff Physician, [facility] Director - Interstitial Lung Disease  Program  Pulmonary Fibrosis [facility] at [facility] [place], [place], [address]   Pager: [phone], If no answer  -> Check AMION or Try [phone] Telephone (clinical office): [phone] Telephone (research): [phone]  12:12 PM 05/22/2024

## 2024-05-22 NOTE — Progress Notes (Signed)
 Cellcept new start  Baseline labs needed  Orders Placed This Encounter  Procedures   CBC w/Diff   Comp Met (CMET)   B Nat Peptide   Hepatitis C antibody   Hepatitis B surface antigen   Hepatitis B core antibody, IgM   ECHOCARDIOGRAM COMPLETE   Pulmonary function test

## 2024-05-22 NOTE — Telephone Encounter (Signed)
 Called pt to reschedule echo. She advised that she was having recurring spouts of low blood pressure. She had went to hospital and was told she was dehydrated and to drink lots of fluids. Patient advised she is not taking any blood pressure medicine just the metoprolol  that was prescribed by Dr Wonda. She was prescribed pretizone (sp) and had to be weaned off of it. She would like a call with what she should do and if any of this is correlated.

## 2024-05-23 LAB — HEPATITIS B CORE ANTIBODY, IGM: Hep B C IgM: NONREACTIVE

## 2024-05-23 LAB — HEPATITIS C ANTIBODY: Hepatitis C Ab: NONREACTIVE

## 2024-05-23 LAB — HEPATITIS B SURFACE ANTIGEN: Hepatitis B Surface Ag: NONREACTIVE

## 2024-05-27 ENCOUNTER — Ambulatory Visit: Admitting: Orthopaedic Surgery

## 2024-05-27 NOTE — Telephone Encounter (Signed)
 ATC patient regarding Cellcept  new start. Unable to reach. Left VM with my callback number  Sherry Pennant, PharmD, MPH, BCPS, CPP Clinical Pharmacist (Rheumatology and Pulmonology)

## 2024-05-28 ENCOUNTER — Ambulatory Visit: Admitting: Podiatry

## 2024-05-28 ENCOUNTER — Other Ambulatory Visit (HOSPITAL_COMMUNITY): Payer: Self-pay

## 2024-05-28 ENCOUNTER — Telehealth: Payer: Self-pay

## 2024-05-28 DIAGNOSIS — Z79899 Other long term (current) drug therapy: Secondary | ICD-10-CM

## 2024-05-28 DIAGNOSIS — J8489 Other specified interstitial pulmonary diseases: Secondary | ICD-10-CM

## 2024-05-28 DIAGNOSIS — Z2989 Encounter for other specified prophylactic measures: Secondary | ICD-10-CM

## 2024-05-28 DIAGNOSIS — M3502 Sicca syndrome with lung involvement: Secondary | ICD-10-CM

## 2024-05-28 MED ORDER — MYCOPHENOLATE MOFETIL 500 MG PO TABS: ORAL_TABLET | ORAL | 0 refills | Status: DC

## 2024-05-28 NOTE — Telephone Encounter (Incomplete Revision)
 Pharmacy Note  Subjective: Patient presents today to the [facility] for follow up office visit.  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  Objective: CBC    Component Value Date/Time   WBC 9.9 05/22/2024 1440   RBC 3.32 (L) 05/22/2024 1440   HGB 10.5 (L) 05/22/2024 1440   HGB 10.1 (L) 02/12/2024 0926   HCT 32.4 (L) 05/22/2024 1440   PLT 305.0 05/22/2024 1440   MCV 97.6 05/22/2024 1440   MCV 89.5 04/02/2016 1205   MCH 32.4 04/03/2024 1114   MCHC 32.4 05/22/2024 1440   RDW 14.6 05/22/2024 1440   LYMPHSABS 3.6 05/22/2024 1440   MONOABS 0.8 05/22/2024 1440   EOSABS 0.4 05/22/2024 1440   BASOSABS 0.1 05/22/2024 1440    CMP     Component Value Date/Time   NA 139 05/22/2024 1440   NA 140 03/23/2023 1004   K 3.9 05/22/2024 1440   CL 104 05/22/2024 1440   CO2 27 05/22/2024 1440   GLUCOSE 98 05/22/2024 1440   BUN 16 05/22/2024 1440   BUN 20 03/23/2023 1004   CREATININE 1.25 (H) 05/22/2024 1440   CREATININE 1.25 (H) 09/05/2016 1037   CALCIUM  9.1 05/22/2024 1440   PROT 6.4 05/22/2024 1440   ALBUMIN 3.7 05/22/2024 1440   AST 25 05/22/2024 1440   ALT 10 05/22/2024 1440   ALKPHOS 70 05/22/2024 1440   BILITOT 0.4 05/22/2024 1440   GFRNONAA 23 (L) 04/03/2024 1114   GFRNONAA 49 (L) 04/02/2016 1154   GFRAA 45 (L) 06/20/2019 1518   GFRAA 56 (L) 04/02/2016 1154    Baseline Immunosuppressant Therapy Labs TB GOLD    Latest Ref Rng & Units 07/27/2023   12:31 PM  Quantiferon TB Gold  Quantiferon TB Gold Plus NEGATIVE NEGATIVE    Hepatitis Panel    Latest Ref Rng & Units 05/22/2024    2:40 PM  Hepatitis  Hep B Surface Ag NON-REACTIVE NON-REACTIVE   Hep B IgM NON-REACTIVE NON-REACTIVE   Hep C Ab NON-REACTIVE NON-REACTIVE    HIV No results found for: HIV Immunoglobulins   SPEP    Latest Ref Rng & Units 05/22/2024    2:40 PM  Serum Protein  Electrophoresis  Total Protein 6.0 - 8.3 g/dL 6.4    H3EI Lab Results  Component Value Date   G6PDH 12.2 02/12/2024   TPMT No results found for: TPMT   HRCT (05/28/2024): Lungs/Pleura: Coarse interstitial infiltrates in the lungs. Ground-glass pattern seen previously is improved. Emphysematous changes in the lungs. 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 No bronchiectasis or reticulonodular changes.  Contraception: Postmenopausal   Assessment/Plan:  Patient was counseled on the purpose, proper use, and adverse effects of mycophenolate  including risk of infection, new or reactivation of viral infections, nausea, and headaches.  Discussed warning of increased risk of development of lymphoma and other malignancies, particularly of the skin.  Discussed risk of neutropenia and discussed importance of regular labs to monitor blood counts, liver function, and kidney function. Patient is postmenopausal. Counseled patient on importance of taking PCP prophylaxis while on mycophenolate .  Counseled patient on purpose, proper use, and adverse effects of sulfamethoxazole/trimethoprim  three times a week. Assessed patient's sulfa allergy. Patient states it happened over 10 years ago resulting in a rash. Patient does not recall severity of rash. Denies taking any other sulfa antibiotics since. Patient will discuss re-challenging with husband and give us  a  call to confirm or deny the rechallenge. If patient chooses not to re-challenge will start Atovaquone  for PJP prophylaxis. Counseled patient on purpose, proper use, and adverse effects of Atovaquone .  Patient recalls frequent diarrhea while on Atovaquone  in the past. Advised patient to split dose into two throughout day to help with GI upset. Dapsone is not a good option due to her positive G6PD results. Provided patient with educational materials and answered all questions.   Patient consented to mycophenolate .  Mycophenlate prescription has been sent to primary pharmacy. Awaiting patient's call back to order preferred PJP prophlayxis agent.   Deleta Colt PharmD Candidate [phone]  [facility]

## 2024-05-28 NOTE — Telephone Encounter (Signed)
 Donna Williamson

## 2024-05-30 NOTE — Telephone Encounter (Signed)
 Received VM from patient -  she wanted to clarify if ok to take Aspercreme and get steroid injections in knees while on Cellcept . Advised this is fine.  Sherry Pennant, PharmD, MPH, BCPS, CPP Clinical Pharmacist (Rheumatology and Pulmonology)

## 2024-06-03 NOTE — Telephone Encounter (Signed)
 Spoke with pt, she was in the hospital in April with pneumonia and was sent home on high dose prednisone  and was not tald how to stop, so when she stopped it abruptly, she had steroid withdrawal. Her blood pressure has been running low and her medical doctor has stopped the diltiazem . She continues with the metoprolol . Her blood pressure was 124/71 this morning. She was also started on a new medication by pulmonary. She is going to contact them today as she is not wanting to take due to side effects. Today she is doing well with no heart symptoms or complaints. She will call with concerns.

## 2024-06-06 ENCOUNTER — Telehealth: Payer: Self-pay | Admitting: Pharmacist

## 2024-06-06 DIAGNOSIS — J189 Pneumonia, unspecified organism: Secondary | ICD-10-CM | POA: Diagnosis not present

## 2024-06-06 NOTE — Telephone Encounter (Addendum)
 Patient reached out regarding possible side effects of mycophenolate . She started on Wednesday, 06/05/2024 and has experienced headaches (has not taken any medications to manage). 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 Has noticed some appetite changes but is still eating. States she has lost 5 pounds since Friday but at the same time has experienced fluid buildup in legs which she noticed this morning upon awakening  Patient advised to discontinue mycophenolate  as she is on lowest dose and we cannot further decrease dose. She inquired about alteratives - discussed azathioprine which is also immunosuppressive medications with risk for fatigue and GI side effects. Advised that of course you would not be able to predict side effects until you take medication. She verbalized understanding. She will discontinue and assess if these new symptoms resolve.   Routing to Dr. Jeannetta and Dr. Geronimo about Cellcept   Patient states she will f/u with cardiologist regarding fluid in legs for partial adjustment of diuretic dosing  Yates Weisgerber, PharmD, MPH, BCPS, CPP Clinical Pharmacist (Rheumatology and Pulmonology)

## 2024-06-10 DIAGNOSIS — J84112 Idiopathic pulmonary fibrosis: Secondary | ICD-10-CM | POA: Diagnosis not present

## 2024-06-10 DIAGNOSIS — F4321 Adjustment disorder with depressed mood: Secondary | ICD-10-CM | POA: Diagnosis not present

## 2024-06-10 DIAGNOSIS — J849 Interstitial pulmonary disease, unspecified: Secondary | ICD-10-CM | POA: Diagnosis not present

## 2024-06-10 DIAGNOSIS — J8410 Pulmonary fibrosis, unspecified: Secondary | ICD-10-CM | POA: Diagnosis not present

## 2024-06-10 DIAGNOSIS — K219 Gastro-esophageal reflux disease without esophagitis: Secondary | ICD-10-CM | POA: Diagnosis not present

## 2024-06-10 DIAGNOSIS — R197 Diarrhea, unspecified: Secondary | ICD-10-CM | POA: Diagnosis not present

## 2024-06-10 DIAGNOSIS — R5383 Other fatigue: Secondary | ICD-10-CM | POA: Diagnosis not present

## 2024-06-11 ENCOUNTER — Other Ambulatory Visit

## 2024-06-13 DIAGNOSIS — J189 Pneumonia, unspecified organism: Secondary | ICD-10-CM | POA: Diagnosis not present

## 2024-06-17 ENCOUNTER — Ambulatory Visit (HOSPITAL_BASED_OUTPATIENT_CLINIC_OR_DEPARTMENT_OTHER)

## 2024-06-17 DIAGNOSIS — R0902 Hypoxemia: Secondary | ICD-10-CM | POA: Diagnosis not present

## 2024-06-17 DIAGNOSIS — J8489 Other specified interstitial pulmonary diseases: Secondary | ICD-10-CM

## 2024-06-17 DIAGNOSIS — M359 Systemic involvement of connective tissue, unspecified: Secondary | ICD-10-CM

## 2024-06-17 DIAGNOSIS — R0609 Other forms of dyspnea: Secondary | ICD-10-CM | POA: Diagnosis not present

## 2024-06-17 LAB — ECHOCARDIOGRAM COMPLETE
AR max vel: 1.44 cm2
AV Area VTI: 1.56 cm2
AV Area mean vel: 1.45 cm2
AV Mean grad: 8 mmHg
AV Peak grad: 15.3 mmHg
Ao pk vel: 1.96 m/s
Area-P 1/2: 3.21 cm2
S' Lateral: 3.3 cm

## 2024-06-17 NOTE — Telephone Encounter (Signed)
 She is seeing Donna Williamson in a month. So copying her to track t his

## 2024-06-22 ENCOUNTER — Other Ambulatory Visit: Payer: Self-pay | Admitting: Internal Medicine

## 2024-06-22 DIAGNOSIS — J8489 Other specified interstitial pulmonary diseases: Secondary | ICD-10-CM

## 2024-06-22 DIAGNOSIS — Z79899 Other long term (current) drug therapy: Secondary | ICD-10-CM

## 2024-07-07 ENCOUNTER — Ambulatory Visit: Payer: Self-pay | Admitting: Internal Medicine

## 2024-07-07 DIAGNOSIS — J189 Pneumonia, unspecified organism: Secondary | ICD-10-CM | POA: Diagnosis not present

## 2024-07-07 NOTE — Progress Notes (Signed)
 Nromal Hepaitis virus panel

## 2024-07-11 ENCOUNTER — Telehealth: Payer: Self-pay | Admitting: *Deleted

## 2024-07-11 NOTE — Progress Notes (Unsigned)
 Complex Care Management Care Guide Note  07/11/2024 Name: Donna Williamson MRN: [id number] DOB: [birth year]  Donna Williamson is a 78 y.o. year old female who is a primary care patient of Shayne Anes, MD and is actively engaged with the care management team. I reached out to Almarie CHRISTELLA Friend by phone today to assist with re-scheduling  with the RN Case Manager.  Follow up plan: Unsuccessful telephone outreach attempt made. A HIPAA compliant phone message was left for the patient providing contact information and requesting a return call.  Thedford Franks, CMA [place]  [facility], [facility] Guide Direct Dial: [phone]  Fax: [phone] Website: [email or web address]

## 2024-07-12 NOTE — Progress Notes (Signed)
 Complex Care Management Care Guide Note  07/12/2024 Name: Donna Williamson MRN: [id number] DOB: July 03, 1946  Donna Williamson is a 78 y.o. year old female who is a primary care patient of Shayne Anes, MD and is actively engaged with the care management team. I reached out to Donna Williamson by phone today to assist with re-scheduling  with the RN Case Manager.  Follow up plan: Telephone appointment with complex care management team member scheduled for:  07/23/2024  Donna Williamson, CMA [place]  [facility], [facility] Guide Direct Dial: [phone]  Fax: [phone] Website: [email or web address]

## 2024-07-14 DIAGNOSIS — J189 Pneumonia, unspecified organism: Secondary | ICD-10-CM | POA: Diagnosis not present

## 2024-07-15 ENCOUNTER — Encounter: Payer: Self-pay | Admitting: Nurse Practitioner

## 2024-07-15 ENCOUNTER — Telehealth (HOSPITAL_COMMUNITY): Payer: Self-pay | Admitting: Pharmacy Technician

## 2024-07-15 ENCOUNTER — Other Ambulatory Visit (HOSPITAL_COMMUNITY): Payer: Self-pay | Admitting: Internal Medicine

## 2024-07-15 ENCOUNTER — Ambulatory Visit: Admitting: Nurse Practitioner

## 2024-07-15 VITALS — BP 122/62 | HR 87 | Ht 65.0 in | Wt 207.0 lb

## 2024-07-15 DIAGNOSIS — J849 Interstitial pulmonary disease, unspecified: Secondary | ICD-10-CM | POA: Insufficient documentation

## 2024-07-15 DIAGNOSIS — J9610 Chronic respiratory failure, unspecified whether with hypoxia or hypercapnia: Secondary | ICD-10-CM | POA: Diagnosis not present

## 2024-07-15 DIAGNOSIS — J8489 Other specified interstitial pulmonary diseases: Secondary | ICD-10-CM

## 2024-07-15 DIAGNOSIS — K219 Gastro-esophageal reflux disease without esophagitis: Secondary | ICD-10-CM

## 2024-07-15 DIAGNOSIS — I351 Nonrheumatic aortic (valve) insufficiency: Secondary | ICD-10-CM | POA: Diagnosis not present

## 2024-07-15 DIAGNOSIS — J019 Acute sinusitis, unspecified: Secondary | ICD-10-CM

## 2024-07-15 DIAGNOSIS — J0110 Acute frontal sinusitis, unspecified: Secondary | ICD-10-CM

## 2024-07-15 LAB — CBC WITH DIFFERENTIAL/PLATELET
Basophils Absolute: 0.1 K/uL (ref 0.0–0.1)
Basophils Relative: 0.7 % (ref 0.0–3.0)
Eosinophils Absolute: 0.6 K/uL (ref 0.0–0.7)
Eosinophils Relative: 6.1 % — ABNORMAL HIGH (ref 0.0–5.0)
HCT: 33.1 % — ABNORMAL LOW (ref 36.0–46.0)
Hemoglobin: 10.7 g/dL — ABNORMAL LOW (ref 12.0–15.0)
Lymphocytes Relative: 29.9 % (ref 12.0–46.0)
Lymphs Abs: 2.8 K/uL (ref 0.7–4.0)
MCHC: 32.2 g/dL (ref 30.0–36.0)
MCV: 95.3 fl (ref 78.0–100.0)
Monocytes Absolute: 0.8 K/uL (ref 0.1–1.0)
Monocytes Relative: 8.1 % (ref 3.0–12.0)
Neutro Abs: 5.2 K/uL (ref 1.4–7.7)
Neutrophils Relative %: 55.2 % (ref 43.0–77.0)
Platelets: 302 K/uL (ref 150.0–400.0)
RBC: 3.47 Mil/uL — ABNORMAL LOW (ref 3.87–5.11)
RDW: 14.9 % (ref 11.5–15.5)
WBC: 9.4 K/uL (ref 4.0–10.5)

## 2024-07-15 LAB — COMPREHENSIVE METABOLIC PANEL WITH GFR
ALT: 11 U/L (ref 0–35)
AST: 29 U/L (ref 0–37)
Albumin: 3.9 g/dL (ref 3.5–5.2)
Alkaline Phosphatase: 74 U/L (ref 39–117)
BUN: 10 mg/dL (ref 6–23)
CO2: 28 meq/L (ref 19–32)
Calcium: 9.3 mg/dL (ref 8.4–10.5)
Chloride: 102 meq/L (ref 96–112)
Creatinine, Ser: 1.11 mg/dL (ref 0.40–1.20)
GFR: 46.92 mL/min — ABNORMAL LOW (ref >60.00)
Glucose, Bld: 82 mg/dL (ref 70–99)
Potassium: 3.6 meq/L (ref 3.5–5.1)
Sodium: 139 meq/L (ref 135–145)
Total Bilirubin: 0.5 mg/dL (ref 0.2–1.2)
Total Protein: 6.8 g/dL (ref 6.0–8.3)

## 2024-07-15 MED ORDER — CEFDINIR 300 MG PO CAPS: 300.0000 mg | ORAL_CAPSULE | Freq: Two times a day (BID) | ORAL | 0 refills | Status: DC

## 2024-07-15 NOTE — Patient Instructions (Addendum)
 Continue Albuterol  inhaler 2 puffs every 6 hours as needed for shortness of breath or wheezing. Notify if symptoms persist despite rescue inhaler/neb use.  Continue flonase  2 sprays each nostril daily  Continue montelukast  1 tab daily  Continue pantoprazole  daily  Continue supplemental oxygen  3 lpm and at night with activity for goal >88-90%  Cefdinir  300 mg Twice daily for 7 days. Take with food.  Start saline nasal rinses 1-2 times a day with bottled, distilled water. Follow with flonase  30 minutes later Restart daily allergy pill   We will try you on azathioprine . This is also an immunosuppression medication that can lower your immune system and put you at higher risk for infections and even cancer. You will need to be closely monitored as a result. The pharmacy team will contact you for next steps Notify if you develop fatigue that is not tolerable or any significant GI side effects  We will draw labs today  Referral to pulmonary rehab   Your echocardiogram should mild aortic valve disease. Dr. Wonda will monitor this yearly   Follow up in 6 weeks after PFT with Katie Samari Bittinger,NP to see how new medication is going. Keep follow up with Dr. Geronimo in December. If symptoms do not improve or worsen, please contact office for sooner follow up or seek emergency care.

## 2024-07-15 NOTE — Telephone Encounter (Signed)
 Auth Submission: APPROVED Site of care: [facility] Payer: [employer] Medicare Medication & CPT/J Code(s) submitted: Leqvio  (Inclisiran) J1306 Diagnosis Code: E78.5 Route of submission (phone, fax, portal):  Phone # Fax # Auth type: Buy/Bill HB Units/visits requested: 284mg  x 2 doses, q 6 months Reference number: [id number] Approval from: 12/15/22 to 10/30/24   PA submitted by MD office.   Peter Daquila, CPhT [facility] Phone: [phone] 07/15/2024

## 2024-07-15 NOTE — Assessment & Plan Note (Signed)
 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 Unable to tolerate CellCept  due to side effects despite lowest dose. Willing to try azathioprine  - will send to pharmacy team to start process.

## 2024-07-15 NOTE — Progress Notes (Unsigned)
 @Patient  ID: Donna Williamson, female    DOB: 03-01-1946, 78 y.o.   MRN: [id number]  Chief Complaint  Patient presents with   Medical Management of Chronic Issues    Pt states no longer taking cellcept  due to side effects / is taking gabapentin  100mg      Referring provider: Shayne Anes, MD  HPI: 78 year old female, never smoker followed for CT-ILD and chronic respiratory failure on supplemental O2. She is a patient of Dr. Reeves and last seen in office 05/22/2024 by [facility] NP. Past medical history significant for NSTEMI, HTN, mild AS, CAD, HF, GERD, CKD, Sjogren's syndrome, HLD, depression.   TEST/EVENTS:  03/15/2024 PFT: FVC 87, FEV1 91, ratio 78, DLCO 63 04/03/2024 HRCT chest: atherosclerosis/calcifications. Scattered nodes, likely reactive and not pathologically enlarged. Esophagus decompressed. Coarse interstitial infiltrates in the lungs. Ground glass previously seen improved. Emphysematous changes. Subpleural fibrosis.  06/17/2024 echo: EF 55-60%. RV function normal. Trivial MR. Mild AS.   05/22/2024: OV with Dr. Geronimo. 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 Since a flareup in spring 2025; not off prednisone . BP is fine. Prior hypotension related to adrenal insufficiency and medications. Dr. Jeannetta felt based on CT chest results, CellCept  would be indicated. PFT stable. Reviewed immunosuppression therapy. Plan to start. Move up echo given concern for PH. If doing well and stable, can consider antifibrotic.   07/15/2024: Today - follow up Discussed the use of AI scribe software for clinical note transcription with the patient, who gave verbal consent to proceed.  History of Present Illness Donna Williamson is an 78 year old female with interstitial lung disease who presents with side effects from Cellcept  and sinus symptoms.  She has been experiencing side effects from Cellcept  (mycophenolate ) used for her connective tissue disease. She noted swelling in her feet. She also experienced heart  palpitations described as 'flutters' and significant fatigue, which improved after discontinuing Cellcept . She was also having headaches. No recurrent symptoms since coming off the CellCept .   For the past one and a half to two weeks, she has had sinus symptoms, including phlegm in her throat, which she attributes to her sinuses. She reports tenderness under her eyes and has been using Flonase  and saline spray for relief. She has not had a fever but feels warm at times despite having occasional chills. She coughs up phlegm which is sometimes clear and sometimes colored. No hemoptysis, headaches, ear pain, sore throat. No recent sick exposures. No viral testing completed.   Her past medical history includes pneumonia in April, for which she was hospitalized. She is currently on oxygen  therapy and has been using Singulair . She was previously on Symbicort and albuterol  but has not used them recently. She also takes Protonix  for reflux.  She is concerned about her connective tissue disease affecting her lungs and is interested in pulmonary rehabilitation.   No increased O2 requirement. Using 3 lpm with exertion. Able to maintain saturations on room air at rest.     Allergies  Allergen Reactions   Levofloxacin Anaphylaxis   Plavix  [Clopidogrel  Bisulfate] Rash   Ciprofibrate     Other reaction(s): swelling, muscle aches   Codeine Nausea Only   Drug Ingredient [Ticagrelor ] Other (See Comments)    Patient reports hemorrhage after medical trial of brilinta  in 2017   Nitrofurantoin  Other (See Comments)    VISION CHANGES   Ciprofloxacin Rash    Other Reaction(s): nausea and vomiting   Penicillins Rash   Sulfa Antibiotics Rash    Immunization History  Administered Date(s) Administered   Influenza-Unspecified 07/01/2022, 07/02/2023   Pneumococcal-Unspecified 07/01/2014, 04/30/2022   Respiratory Syncytial Virus Vaccine,Recomb Aduvanted(Arexvy) 08/31/2022   Td 09/10/2019   Tdap 08/23/2015    Unspecified SARS-COV-2 Vaccination 11/25/2019, 12/16/2019, 08/01/2020, 07/16/2021, 04/07/2022, 07/31/2022   Zoster Recombinant(Shingrix) 01/31/2018, 05/07/2018    Past Medical History:  Diagnosis Date   Anemia    Anginal pain (HCC)    Anxiety    Arthritis    feet, knees, back (05/02/2016)   Asthma    CAD (coronary artery disease)    a. 04/2016: NSTEMI 95% stenosis 1st Mrg (s/p DES)   Chest pain    a. 10/2013 Exercise Myoview : Ef 65%, no ischemia.   CHF (congestive heart failure) (HCC)    Chronic bronchitis (HCC)    Chronic lower back pain    COPD (chronic obstructive pulmonary disease) (HCC)    GERD (gastroesophageal reflux disease)    History of blood transfusion 1960s   related to my hysterectomy   Hypercholesterolemia    Hypertension    Lower extremity edema    Mild aortic stenosis 03/07/2023   TTE 02/28/2023: EF 55-60, no RWMA, mild asymmetric LVH, NL RVSF, mild MR, mild aortic stenosis (bulky subvalvular calcification), mean AV gradient 9.6, V-max 199 cm/s, RAP 3   Mitral valve prolapse    NSTEMI (non-ST elevated myocardial infarction) (HCC) 05/01/2016   Pneumonia 05/01/2016   saw trace of slight pneumonia/CT scan (05/02/2016)   Seasonal allergies    Sjogren's syndrome (HCC)    Wheezing     Tobacco History: Social History   Tobacco Use  Smoking Status Never   Passive exposure: Past  Smokeless Tobacco Never   Counseling given: Not Answered   Outpatient Medications Prior to Visit  Medication Sig Dispense Refill   acetaminophen  (TYLENOL ) 500 MG tablet Take 500-1,000 mg by mouth every 6 (six) hours as needed for moderate pain (pain score 4-6).     albuterol  (PROVENTIL ) (2.5 MG/3ML) 0.083% nebulizer solution Take 3 mLs (2.5 mg total) by nebulization every 2 (two) hours as needed for wheezing or shortness of breath. 75 mL 2   ALPRAZolam  (XANAX ) 0.5 MG tablet Take 0.5 mg by mouth daily as needed for anxiety.      aspirin  EC 81 MG tablet Take 81 mg by mouth daily.      benzonatate  (TESSALON ) 200 MG capsule Take 200 mg by mouth 3 (three) times daily as needed for cough. (Patient not taking: Reported on 07/16/2024)     budesonide-formoterol  (SYMBICORT) 160-4.5 MCG/ACT inhaler Inhale 2 puffs into the lungs 2 (two) times daily.     carboxymethylcellulose (REFRESH PLUS) 0.5 % SOLN Place 1 drop into both eyes daily as needed (dry eyes).     Cholecalciferol  (VITAMIN D3) 2000 UNITS TABS Take 2,000 Units by mouth daily.     cyanocobalamin  500 MCG tablet Take 1,000 mcg by mouth daily.     dextromethorphan -guaiFENesin  (MUCINEX  DM) 30-600 MG 12hr tablet Take 1 tablet by mouth 2 (two) times daily. (Patient taking differently: Take 1 tablet by mouth 2 (two) times daily as needed.) 30 tablet 0   diltiazem  (CARDIZEM  CD) 120 MG 24 hr capsule Take 120 mg by mouth daily. (Patient not taking: Reported on 07/16/2024)     EPIPEN  2-PAK 0.3 MG/0.3ML SOAJ injection Inject 0.3 mg into the skin daily as needed (allergic reaction).      estradiol  (ESTRACE ) 0.5 MG tablet Take 0.5 mg by mouth daily. (Patient not taking: Reported on 07/16/2024)     ezetimibe  (ZETIA )  10 MG tablet Take 10 mg by mouth at bedtime.     FLUoxetine  (PROZAC ) 20 MG capsule Take 20 mg by mouth daily.     fluticasone  (FLONASE ) 50 MCG/ACT nasal spray Place 2 sprays into the nose daily.     furosemide  (LASIX ) 20 MG tablet Take 1 tablet (20 mg total) by mouth daily. 90 tablet 3   gabapentin  (NEURONTIN ) 100 MG capsule Take 100 mg by mouth daily.     inclisiran (LEQVIO ) 284 MG/1.5ML SOSY injection Inject 284 mg into the skin once.     isosorbide  mononitrate (IMDUR ) 30 MG 24 hr tablet Take 1 tablet (30 mg total) by mouth daily. 90 tablet 1   metoprolol  tartrate (LOPRESSOR ) 25 MG tablet TAKE 1/2 TABLET(12.5 MG) BY MOUTH TWICE DAILY 90 tablet 3   montelukast  (SINGULAIR ) 10 MG tablet Take 10 mg by mouth at bedtime.     Multiple Vitamin (MULTIVITAMIN WITH MINERALS) TABS tablet Take 1 tablet by mouth daily.     nitroGLYCERIN   (NITROSTAT ) 0.4 MG SL tablet Place 1 tablet (0.4 mg total) under the tongue every 5 (five) minutes as needed for chest pain (up to 3 doses. If taking 3rd dose call 911). 25 tablet 3   pantoprazole  (PROTONIX ) 40 MG tablet TAKE 2 TABLETS(80 MG) BY MOUTH DAILY 180 tablet 3   potassium chloride  (KLOR-CON ) 10 MEQ tablet Take 10 mEq by mouth See admin instructions. Take 1 tablet (10 mEq) by mouth on Monday's and Thursday's.     PROAIR  HFA 108 (90 BASE) MCG/ACT inhaler Inhale 1-2 puffs into the lungs every 4 (four) hours as needed for wheezing.     rosuvastatin  (CRESTOR ) 20 MG tablet Take 1 tablet (20 mg total) by mouth daily at 6 PM. 30 tablet 0   No facility-administered medications prior to visit.     Review of Systems: as above    Physical Exam:  BP 122/62   Pulse 87   Ht 5' 5 (1.651 m)   Wt 207 lb (93.9 kg)   SpO2 94%   PF (!) 3 L/min   BMI 34.45 kg/m   GEN: Pleasant, interactive, well-kempt; chronically-ill appearing; obese; in no acute distress HEENT:  Normocephalic and atraumatic. PERRLA. Sclera white. Nasal turbinates pink, moist and patent bilaterally. No rhinorrhea present. Oropharynx pink and moist, without exudate or edema. No lesions, ulcerations, or postnasal drip.  NECK:  Supple w/ fair ROM. No JVD present. No lymphadenopathy.   CV: RRR, no m/r/g, no peripheral edema. Pulses intact, +2 bilaterally. No cyanosis, pallor or clubbing. PULMONARY:  Unlabored, regular breathing. Mild bibasilar crackles otherwise clear bilaterally A&P w/o wheezes/rales/rhonchi. No accessory muscle use.  GI: BS present and normoactive. Soft, non-tender to palpation.  MSK: No erythema, warmth or tenderness. Cap refil <2 sec all extrem.  Neuro: A/Ox3. No focal deficits noted.   Skin: Warm, no lesions or rashe Psych: Normal affect and behavior. Judgement and thought content appropriate.     Lab Results:  CBC    Component Value Date/Time   WBC 9.4 07/15/2024 1121   RBC 3.47 (L) 07/15/2024  1121   HGB 10.7 (L) 07/15/2024 1121   HGB 10.1 (L) 02/12/2024 0926   HCT 33.1 (L) 07/15/2024 1121   PLT 302.0 07/15/2024 1121   MCV 95.3 07/15/2024 1121   MCV 89.5 04/02/2016 1205   MCH 32.4 04/03/2024 1114   MCHC 32.2 07/15/2024 1121   RDW 14.9 07/15/2024 1121   LYMPHSABS 2.8 07/15/2024 1121   MONOABS 0.8 07/15/2024 1121  EOSABS 0.6 07/15/2024 1121   BASOSABS 0.1 07/15/2024 1121    BMET    Component Value Date/Time   NA 139 07/15/2024 1121   NA 140 03/23/2023 1004   K 3.6 07/15/2024 1121   CL 102 07/15/2024 1121   CO2 28 07/15/2024 1121   GLUCOSE 82 07/15/2024 1121   BUN 10 07/15/2024 1121   BUN 20 03/23/2023 1004   CREATININE 1.11 07/15/2024 1121   CREATININE 1.25 (H) 09/05/2016 1037   CALCIUM  9.3 07/15/2024 1121   GFRNONAA 23 (L) 04/03/2024 1114   GFRNONAA 49 (L) 04/02/2016 1154   GFRAA 45 (L) 06/20/2019 1518   GFRAA 56 (L) 04/02/2016 1154    BNP    Component Value Date/Time   BNP 54.4 04/03/2024 1114   BNP 35.9 04/02/2016 1154     Imaging:  ECHOCARDIOGRAM COMPLETE Result Date: 06/17/2024    ECHOCARDIOGRAM REPORT   Patient Name:   ALYLAH BLAKNEY Date of Exam: 06/17/2024 Medical Rec #:  [id number]         Height:       65.0 in Accession #:    [id number]        Weight:       211.0 lb Date of Birth:  Jan 12, 1946         BSA:          2.024 m Patient Age:    78 years          BP:           108/60 mmHg Patient Gender: F                 HR:           72 bpm. Exam Location:  Outpatient Procedure: 3D Echo, 2D Echo, Color Doppler and Cardiac Doppler (Both Spectral            and Color Flow Doppler were utilized during procedure). Indications:    Dyspnea  History:        Patient has prior history of Echocardiogram examinations, most                 recent 02/28/2023. Previous Myocardial Infarction,                 Arrythmias:PVC, Signs/Symptoms:Chest Pain; Risk                 Factors:Non-Smoker, Dyslipidemia and Hypertension.  Sonographer:    Orvil Holmes RDCS Referring  Phys: 41 MURALI RAMASWAMY IMPRESSIONS  1. Left ventricular ejection fraction, by estimation, is 55 to 60%. The left ventricle has normal function. Left ventricular diastolic parameters are indeterminate.  2. Right ventricular systolic function is low normal. The right ventricular size is normal.  3. Trivial mitral valve regurgitation.  4. AV is thickend, calcified Difficult to see individual leaflets well Peak and mean gradients through the valve are 15 and 8 mm Hg. Probable mild AS. Further estimation of valve area limited by data for LVOT.SABRA The aortic valve has an indeterminant number of cusps. Aortic valve regurgitation is not visualized.  5. The inferior vena cava is normal in size with greater than 50% respiratory variability, suggesting right atrial pressure of 3 mmHg. FINDINGS  Left Ventricle: Left ventricular ejection fraction, by estimation, is 55 to 60%. The left ventricle has normal function. Global longitudinal strain performed but not reported based on interpreter judgement due to suboptimal tracking. 3D ejection fraction reviewed and evaluated as part of the interpretation. Alternate measurement of EF  is felt to be most reflective of LV function. The left ventricular internal cavity size was normal in size. There is no left ventricular hypertrophy. Left ventricular diastolic parameters are indeterminate. Right Ventricle: The right ventricular size is normal. Right vetricular wall thickness was not assessed. Right ventricular systolic function is low normal. Left Atrium: Left atrial size was normal in size. Right Atrium: Right atrial size was normal in size. Pericardium: There is no evidence of pericardial effusion. Mitral Valve: There is mild thickening of the mitral valve leaflet(s). Mild to moderate mitral annular calcification. Trivial mitral valve regurgitation. Tricuspid Valve: The tricuspid valve is normal in structure. Tricuspid valve regurgitation is trivial. Aortic Valve: AV is thickend,  calcified Difficult to see individual leaflets well Peak and mean gradients through the valve are 15 and 8 mm Hg. Probable mild AS. Further estimation of valve area limited by data for LVOT. The aortic valve has an indeterminant number of cusps. Aortic valve regurgitation is not visualized. Aortic valve mean gradient measures 8.0 mmHg. Aortic valve peak gradient measures 15.3 mmHg. Aortic valve area, by VTI measures 1.56 cm. Pulmonic Valve: The pulmonic valve was not well visualized. Pulmonic valve regurgitation is not visualized. Aorta: The aortic root and ascending aorta are structurally normal, with no evidence of dilitation. Venous: The inferior vena cava is normal in size with greater than 50% respiratory variability, suggesting right atrial pressure of 3 mmHg. IAS/Shunts: No atrial level shunt detected by color flow Doppler.  LEFT VENTRICLE PLAX 2D LVIDd:         4.68 cm   Diastology LVIDs:         3.30 cm   LV e' medial:    5.87 cm/s LV PW:         1.03 cm   LV E/e' medial:  15.1 LV IVS:        1.15 cm   LV e' lateral:   7.51 cm/s LVOT diam:     2.20 cm   LV E/e' lateral: 11.8 LV SV:         62 LV SV Index:   30 LVOT Area:     3.80 cm                           3D Volume EF:                          3D EF:        50 %                          LV EDV:       137 ml                          LV ESV:       69 ml                          LV SV:        68 ml RIGHT VENTRICLE RV Basal diam:  3.98 cm RV Mid diam:    3.25 cm RV S prime:     14.40 cm/s TAPSE (M-mode): 3.4 cm LEFT ATRIUM           Index        RIGHT ATRIUM  Index LA diam:      4.20 cm 2.08 cm/m   RA Area:     13.70 cm LA Vol (A2C): 64.6 ml 31.92 ml/m  RA Volume:   29.70 ml  14.68 ml/m LA Vol (A4C): 55.3 ml 27.33 ml/m  AORTIC VALVE AV Area (Vmax):    1.44 cm AV Area (Vmean):   1.45 cm AV Area (VTI):     1.56 cm AV Vmax:           195.50 cm/s AV Vmean:          131.000 cm/s AV VTI:            0.396 m AV Peak Grad:      15.3 mmHg AV Mean  Grad:      8.0 mmHg LVOT Vmax:         73.90 cm/s LVOT Vmean:        49.800 cm/s LVOT VTI:          0.162 m LVOT/AV VTI ratio: 0.41  AORTA Ao Root diam: 2.80 cm Ao Asc diam:  2.90 cm MITRAL VALVE MV Area (PHT): 3.21 cm     SHUNTS MV Decel Time: 236 msec     Systemic VTI:  0.16 m MV E velocity: 88.60 cm/s   Systemic Diam: 2.20 cm MV A velocity: 102.00 cm/s MV E/A ratio:  0.87 Vina Gull MD Electronically signed by Vina Gull MD Signature Date/Time: 06/17/2024/11:04:29 PM    Final     Administration History     None          Latest Ref Rng & Units 03/15/2024   10:17 AM 09/27/2023   12:50 PM  PFT Results  FVC-Pre L 2.45  2.46   FVC-Predicted Pre % 87  86   FVC-Post L  2.50   FVC-Predicted Post %  88   Pre FEV1/FVC % % 78  78   Post FEV1/FCV % %  79   FEV1-Pre L 1.90  1.92   FEV1-Predicted Pre % 91  90   FEV1-Post L  1.98   DLCO uncorrected ml/min/mmHg 10.97  11.70   DLCO UNC% % 55  59   DLCO corrected ml/min/mmHg 12.45  11.70   DLCO COR %Predicted % 63  59   DLVA Predicted % 79  74   TLC L  4.82   TLC % Predicted %  91   RV % Predicted %  98     No results found for: NITRICOXIDE      Assessment & Plan:   ILD (interstitial lung disease) (HCC) CT ILD related to Sjogren's. Unable to tolerate CellCept  due to side effects despite lowest dose. Willing to try azathioprine  - will send to pharmacy team to start process.   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 Previously on Cellcept  (mycophenolate ) but experienced side effects including fatigue and swelling, which improved after discontinuation. Azathioprine  is considered as an alternative immunosuppressant, with potential side effects including fatigue and gastrointestinal symptoms such as nausea and diarrhea. The decision to try azathioprine  is based on the need to control the  connective tissue disease to prevent further lung damage. May need to consider antifibrotic therapy in the future.  - Initiate azathioprine  at a low dose and titrate as tolerated - Monitor for side effects such as fatigue and gastrointestinal symptoms - Recheck labs including kidney function, liver function, and blood counts -  Refer to pulmonary rehabilitation to improve stamina and tolerance - Schedule lung function testing  Acute bacterial sinusitis Presents with symptoms consistent with acute bacterial sinusitis, including phlegm in the throat, sinus tenderness, and postnasal drainage. Symptoms have persisted for about a week and a half to two weeks, likely starting as viral or allergic and progressing to bacterial infection. - Prescribe cefdinir  for 7 days, monitor for rash due to penicillin allergy - Advise use of Neal Med Rinse or EchoStar with distilled water to flush sinuses - Continue Flonase  nasal spray - Take Claritin daily for allergy management  Aortic valve insufficiency, mild Mild aortic valve insufficiency with calcifications, as noted on a recent echocardiogram. The condition requires annual monitoring to assess for progression. - Repeat echocardiogram with cardiology in one year to monitor aortic valve stenosis  Gastroesophageal reflux disease Currently managing gastroesophageal reflux disease with Protonix . She has not reported nausea with previous medications but uses ginger-based remedies for stomach discomfort. - Continue Protonix  for reflux management  Chronic respiratory failure Stable without increased O2 requirement. Goal >88-90% - Continue supplemental oxygen      Advised if symptoms do not improve or worsen, to please contact office for sooner follow up or seek emergency care.   I spent 35 minutes of dedicated to the care of this patient on the date of this encounter to include pre-visit review of records, face-to-face time with the patient discussing  conditions above, post visit ordering of testing, clinical documentation with the electronic health record, making appropriate referrals as documented, and communicating necessary findings to members of the patients care team.  Comer LULLA Rouleau, NP 07/17/2024  Pt aware and understands NP's role.

## 2024-07-15 NOTE — Telephone Encounter (Signed)
 07/15/2024: Spoke with pt. Since discontinuing mycophenolate , symptoms have all resolved and she is feeling much better. She is interested in trying azathioprine . Please start at 25 mg daily and titrate as tolerate. Lab monitoring every 4 weeks. Reviewed high risk medication use and side effect profile. Advised to call regarding and side effects for troubleshooting/management. Thanks!

## 2024-07-16 ENCOUNTER — Telehealth (HOSPITAL_COMMUNITY): Payer: Self-pay

## 2024-07-16 ENCOUNTER — Telehealth: Payer: Self-pay

## 2024-07-16 DIAGNOSIS — M359 Systemic involvement of connective tissue, unspecified: Secondary | ICD-10-CM

## 2024-07-16 DIAGNOSIS — Z79899 Other long term (current) drug therapy: Secondary | ICD-10-CM

## 2024-07-16 DIAGNOSIS — M3502 Sicca syndrome with lung involvement: Secondary | ICD-10-CM

## 2024-07-16 MED ORDER — AZATHIOPRINE 50 MG PO TABS: ORAL_TABLET | ORAL | 0 refills | Status: DC

## 2024-07-16 NOTE — Telephone Encounter (Signed)
 Pleas make sure tPMT test ordered before immuran

## 2024-07-16 NOTE — Telephone Encounter (Signed)
 New start azathioprine  counseling reviewed in separate thread. Pharmacy team to monitor.   Aleck Puls, PharmD, BCPS Clinical Pharmacist  [facility]

## 2024-07-16 NOTE — Telephone Encounter (Signed)
 Patient unable to tolerate CellCept  (swelling in her feet, fatigue, headaches). Stopped medication and symptoms resolved. PMH CTD ILD related to Sjogren's. Willing to try azathioprine  per last OV with Katie Cobb. Referred to pharmacy team for counseling.  Patient was counseled on the purpose, proper use, and possible adverse effects of azathioprine  including risk of infection, new or reactivation of viral infections, nausea, vomiting, diarrhea, cytopenia, hair loss, or rash. Serious side effects may include dose-related blood count abnormalities, infections (including PML), liver dysfunction, pancreatitis, and even certain types of cancers in cases of chronic immunosuppressive use.   Due to GI-related side effects, may be better tolerated if taken with food.   Do not drink alcohol  with this medication. She denies alcohol  use.  Discussed ways to prevent infections. Avoid sick contacts. Wear a mask if you feel appropriate and if in crowded areas, such as airports. Vaccines may be less effective while taking azathioprine  due to suppressed immune response, but vaccines still play an important role in preventing infections. Do not use live attenuated vaccines while taking azathioprine .   Azathioprine  is an immunosuppressive agent, so you should HOLD azathioprine  during an active infection. Notify medical team if you have a fever. Hold the medication if you are having surgery. - She is currently taking antibiotic. Advised to complete the course of antibiotic prior to starting this medication. Planned start of azathioprine : 07/23/24.   Discussed risk of neutropenia and discussed importance of regular labs to monitor blood counts, liver function, and kidney function. - Lab appt scheduled 08/12/24. Labs (CBC/CMET) are already ordered. - Labs reviewed: CBC/CMP stable 07/15/24. Hepatitis C antibody, Hepatitis B surface antigen, Hepatitis B core antibody negative 05/22/24  Per chart message from Izetta Rouleau, NP,  start azathioprine  at 25mg  daily. Dosing will be:  - Weeks 1-2: Azathioprine  25mg  (one-half tab) by mouth daily. Repeat labs(CBC/CMP), assess tolerability. If labs stable and well tolerated,   - Weeks 3-4: Azathioprine  50mg  daily. Repeat labs (CBC/CMP), assess tolerability. If labs stable and well tolerated,   - Weeks 4-6: Azathioprine  100mg  daily. Repeat labs (CBC/CMP). If labs stable and well tolerated,   - Weeks 7 and thereafter: Azathioprine  150mg  daily.   Patient will alert team if she is unable to tolerate medication.   She calls back to ask if caffeine is okay with this medication. She drinks diet coke. Advised this is okay.   Aleck Puls, PharmD, BCPS Clinical Pharmacist  [facility]

## 2024-07-16 NOTE — Telephone Encounter (Signed)
 Pt insurance is active and benefits verified through [facility]. Co-pay $20, DED $0/$0 met, out of pocket $3,300/$1,263.18 met, co-insurance 0%. No pre-authorization required. 07/16/2024 @ 11:30am, spoke with Hadassah, REF# [id number].

## 2024-07-16 NOTE — Telephone Encounter (Signed)
 Called patient regarding pulmonary rehab, patient confirmed interest in program. She stated she would check her calendar and call us  back to schedule.

## 2024-07-17 ENCOUNTER — Other Ambulatory Visit

## 2024-07-17 ENCOUNTER — Encounter: Payer: Self-pay | Admitting: Nurse Practitioner

## 2024-07-17 DIAGNOSIS — Z79899 Other long term (current) drug therapy: Secondary | ICD-10-CM | POA: Diagnosis not present

## 2024-07-17 NOTE — Telephone Encounter (Signed)
 TMPT ordered. Spoke with patient - lab appt scheduled for 07/17/24. PharmD will alert patient of results prior to starting Imuran  therapy. Patient verbalizes understanding and agreement with plan.  Aleck Puls, PharmD, BCPS Clinical Pharmacist  [facility]

## 2024-07-17 NOTE — Telephone Encounter (Signed)
 TPMT ordered, patient scheduled for lab visit 07/17/24. Pharmacy team will communicate results to patient and enter Imuran  Rx pending lab results. Patient aware of plan.

## 2024-07-17 NOTE — Addendum Note (Signed)
 Addended by: Takyia Sindt L on: 07/17/2024 08:18 AM   Modules accepted: Orders

## 2024-07-22 ENCOUNTER — Telehealth: Payer: Self-pay

## 2024-07-22 NOTE — Telephone Encounter (Signed)
 Patient LVM, returned call.   She was asking about lab results. TPMT pending.   Reassured patient she will hear from our office when lab results.   Aleck Puls, PharmD, BCPS Clinical Pharmacist  [facility]

## 2024-07-22 NOTE — Telephone Encounter (Signed)
 Spoke with patient - updated that lab results are still pending. See updates in separate thread (medication management 07/16/24).  Aleck Puls, PharmD, BCPS Clinical Pharmacist  [facility]

## 2024-07-22 NOTE — Telephone Encounter (Signed)
 Copied from CRM [id number]. Topic: Clinical - Lab/Test Results >> Jul 22, 2024  3:46 PM Rozanna MATSU wrote: Reason for CRM: Pt calling Aleck back about lab results before picking medicine

## 2024-07-23 ENCOUNTER — Other Ambulatory Visit: Payer: Self-pay

## 2024-07-23 NOTE — Patient Outreach (Signed)
 Complex Care Management   Visit Note  07/23/2024  Name:  Donna Williamson MRN: [id number] DOB: 01-25-1946  Situation: Referral received for Complex Care Management related to Pulmonary disease I obtained verbal consent from Patient.  Visit completed with Patient  on the phone  Background:   Past Medical History:  Diagnosis Date   Anemia    Anginal pain    Anxiety    Arthritis    feet, knees, back (05/02/2016)   Asthma    CAD (coronary artery disease)    a. 04/2016: NSTEMI 95% stenosis 1st Mrg (s/p DES)   Chest pain    a. 10/2013 Exercise Myoview : Ef 65%, no ischemia.   CHF (congestive heart failure) (HCC)    Chronic bronchitis (HCC)    Chronic lower back pain    COPD (chronic obstructive pulmonary disease) (HCC)    GERD (gastroesophageal reflux disease)    History of blood transfusion 1960s   related to my hysterectomy   Hypercholesterolemia    Hypertension    Lower extremity edema    Mild aortic stenosis 03/07/2023   TTE 02/28/2023: EF 55-60, no RWMA, mild asymmetric LVH, NL RVSF, mild MR, mild aortic stenosis (bulky subvalvular calcification), mean AV gradient 9.6, V-max 199 cm/s, RAP 3   Mitral valve prolapse    NSTEMI (non-ST elevated myocardial infarction) (HCC) 05/01/2016   Pneumonia 05/01/2016   saw trace of slight pneumonia/CT scan (05/02/2016)   Seasonal allergies    Sjogren's syndrome    Wheezing     Assessment: Patient Reported Symptoms:  Cognitive Cognitive Status: No symptoms reported      Neurological Neurological Review of Symptoms: No symptoms reported    HEENT HEENT Symptoms Reported: No symptoms reported      Cardiovascular Cardiovascular Symptoms Reported: No symptoms reported    Respiratory Respiratory Symptoms Reported: Shortness of breath Other Respiratory Symptoms: recent diagnosis of pulmonary fibrosis, wears O2 at 2l/[place] at rest and 4l/[place] with activity. using pulse ox sat 95-    Endocrine Endocrine Symptoms Reported: No symptoms  reported Is patient diabetic?: No    Gastrointestinal Gastrointestinal Symptoms Reported: No symptoms reported      Genitourinary Genitourinary Symptoms Reported: No symptoms reported    Integumentary Integumentary Symptoms Reported: Bruising Additional Integumentary Details: patient reports bruised easily    Musculoskeletal Musculoskelatal Symptoms Reviewed: Limited mobility, Unsteady gait Additional Musculoskeletal Details: uses cane, reports both knees need replacement.   Falls in the past year?: Yes Number of falls in past year: 2 or more Was there an injury with Fall?: No Fall Risk Category Calculator: 2 Patient Fall Risk Level: Moderate Fall Risk Patient at Risk for Falls Due to: Impaired balance/gait, History of fall(s) Fall risk Follow up: Falls prevention discussed, Education provided  Psychosocial Psychosocial Symptoms Reported: No symptoms reported         There were no vitals filed for this visit.  Medications Reviewed Today     Reviewed by Marlynn Hinckley M, RN (Registered Nurse) on 07/23/24 at 1317  Med List Status: <None>   Medication Order Taking? Sig Documenting Provider Last Dose Status Informant  acetaminophen  (TYLENOL ) 500 MG tablet [id number] Yes Take 500-1,000 mg by mouth every 6 (six) hours as needed for moderate pain (pain score 4-6). [provider]  Active Self, Pharmacy Records  albuterol  (PROVENTIL ) (2.5 MG/3ML) 0.083% nebulizer solution [id number] Yes Take 3 mLs (2.5 mg total) by nebulization every 2 (two) hours as needed for wheezing or shortness of breath. Akula, Vijaya, MD  Active Self,  Pharmacy Records           Med Note MACK, IZETTA DEL   Fri Jan 10, 2020  3:15 PM)    ALPRAZolam  (XANAX ) 0.5 MG tablet [id number] Yes Take 0.5 mg by mouth daily as needed for anxiety.  [provider]  Active Self, Pharmacy Records           Med Note ODETTA, [facility] A   Mon Aug 14, 2017  9:55 AM)    aspirin  EC 81 MG tablet [id number] Yes Take 81 mg by  mouth daily. [provider]  Active Self, Pharmacy Records  benzonatate  (TESSALON ) 200 MG capsule [id number] Yes Take 200 mg by mouth 3 (three) times daily as needed for cough. [provider]  Active Self, Pharmacy Records  budesonide-formoterol  [facility]) 160-4.5 MCG/ACT inhaler [id number] Yes Inhale 2 puffs into the lungs 2 (two) times daily.  Patient taking differently: Inhale 2 puffs into the lungs 2 (two) times daily.   [provider]  Active Self, Pharmacy Records           Med Note JACKOLYN, WADDELL DEL   Thu Feb 08, 2024  9:33 AM) Was taken off, but restarted the day before yesterday.   carboxymethylcellulose (REFRESH PLUS) 0.5 % SOLN [id number] Yes Place 1 drop into both eyes daily as needed (dry eyes). [provider]  Active Self, Pharmacy Records  cefdinir  (OMNICEF ) 300 MG capsule [id number]  Take 1 capsule (300 mg total) by mouth 2 (two) times daily.  Patient not taking: Reported on 07/23/2024   Malachy Comer GAILS, NP  Active   Cholecalciferol  (VITAMIN D3) 2000 UNITS TABS [id number] Yes Take 2,000 Units by mouth daily. [provider]  Active Self, Pharmacy Records  cyanocobalamin  500 MCG tablet [id number] Yes Take 1,000 mcg by mouth daily. [provider]  Active Self, Pharmacy Records  dextromethorphan -guaiFENesin  (MUCINEX  DM) 30-600 MG 12hr tablet [id number] Yes Take 1 tablet by mouth 2 (two) times daily.  Patient taking differently: Take 1 tablet by mouth 2 (two) times daily as needed.   Gherghe, Costin M, MD  Active   diltiazem  (CARDIZEM  CD) 120 MG 24 hr capsule [id number]  Take 120 mg by mouth daily.  Patient not taking: Reported on 07/23/2024   [provider]  Active Self, Pharmacy Records  EPIPEN  2-PAK 0.3 MG/0.3ML SOAJ injection [id number] Yes Inject 0.3 mg into the skin daily as needed (allergic reaction).  [provider]  Active Self, Pharmacy Records           Med Note CRAIG, [facility] V   Fri Nov 29, 2013   1:48 PM)     estradiol  (ESTRACE ) 0.5 MG tablet [id number]  Take 0.5 mg by mouth daily.  Patient not taking: Reported on 07/23/2024   [provider]  Active Self, Pharmacy Records  ezetimibe  (ZETIA ) 10 MG tablet [id number] Yes Take 10 mg by mouth at bedtime. [provider]  Active Self, Pharmacy Records  FLUoxetine  (PROZAC ) 20 MG capsule [id number] Yes Take 20 mg by mouth daily. [provider]  Active Self, Pharmacy Records  fluticasone  (FLONASE ) 50 MCG/ACT nasal spray [id number] Yes Place 2 sprays into the nose daily. [provider]  Active Self, Pharmacy Records  furosemide  (LASIX ) 20 MG tablet [id number] Yes Take 1 tablet (20 mg total) by mouth daily. Wonda Sharper, MD  Active Self, Pharmacy Records  gabapentin  (NEURONTIN ) 100 MG capsule [id number] Yes Take 100 mg by mouth daily. [provider]  Active   inclisiran (LEQVIO ) 284  MG/1.5ML SOSY injection [id number] Yes Inject 284 mg into the skin once. [provider]  Active Self, Pharmacy Records           Med Note JACKOLYN, TAYLOR H   Thu Feb 08, 2024  9:35 AM) [facility] administers every 6 months.   isosorbide  mononitrate (IMDUR ) 30 MG 24 hr tablet [id number] Yes Take 1 tablet (30 mg total) by mouth daily. Cooper, Michael, MD  Active   metoprolol  tartrate (LOPRESSOR ) 25 MG tablet [id number] Yes TAKE 1/2 TABLET(12.5 MG) BY MOUTH TWICE DAILY Wonda Sharper, MD  Active   montelukast  (SINGULAIR ) 10 MG tablet [id number] Yes Take 10 mg by mouth at bedtime. [provider]  Active Self, Pharmacy Records  Multiple Vitamin (MULTIVITAMIN WITH MINERALS) TABS tablet [id number] Yes Take 1 tablet by mouth daily. [provider]  Active Self, Pharmacy Records  nitroGLYCERIN  (NITROSTAT ) 0.4 MG SL tablet [id number] Yes Place 1 tablet (0.4 mg total) under the tongue every 5 (five) minutes as needed for chest pain (up to 3 doses. If taking 3rd dose call 911). Lelon Glendia ONEIDA DEVONNA  Active Self, Pharmacy  Records           Med Note STEPHEN, [facility]   Tue Jan 31, 2023  3:17 PM)    pantoprazole  (PROTONIX ) 40 MG tablet [id number] Yes TAKE 2 TABLETS(80 MG) BY MOUTH DAILY Wonda Sharper, MD  Active   potassium chloride  (KLOR-CON ) 10 MEQ tablet [id number] Yes Take 10 mEq by mouth See admin instructions. Take 1 tablet (10 mEq) by mouth on Monday's and Thursday's. [provider]  Active Self, Pharmacy Records  PROAIR  HFA 108 (90 BASE) MCG/ACT inhaler [id number] Yes Inhale 1-2 puffs into the lungs every 4 (four) hours as needed for wheezing. [provider]  Active Self, Pharmacy Records           Med Note CRAIG, SUZEN V   Fri Nov 29, 2013  1:48 PM)    rosuvastatin  (CRESTOR ) 20 MG tablet [id number] Yes Take 1 tablet (20 mg total) by mouth daily at 6 PM. Jerrie Anger, PA  Active Self, Pharmacy Records          Recommendation:   Continue Current Plan of Care  Follow Up Plan:   Telephone follow up appointment date/time:  08/22/24 at 2:00 pm  Heddy Shutter, RN, MSN, BSN, CCM [place]  [facility], Population Health Case Manager Phone: [phone]

## 2024-07-23 NOTE — Patient Instructions (Signed)
 Visit Information  Thank you for taking time to visit with me today. Please don't hesitate to contact me if I can be of assistance to you before our next scheduled appointment.  Your next care management appointment is by telephone on 08/22/24 at 2:00 pm  Please call the care guide team at [phone] if you need to cancel, schedule, or reschedule an appointment.   Please call the Suicide and Crisis Lifeline: 988 call the [place]  National Suicide Prevention Lifeline: (832)094-8552 or TTY: 940-510-9597 TTY 228-167-3543) to talk to a trained counselor call 1-800-273-TALK (toll free, 24 hour hotline) if you are experiencing a Mental Health or Behavioral Health Crisis or need someone to talk to.  Heddy Shutter, RN, MSN, BSN, CCM [place]  [facility], Population Health Case Manager Phone: [phone]

## 2024-07-24 ENCOUNTER — Ambulatory Visit: Payer: Self-pay | Admitting: Internal Medicine

## 2024-07-24 LAB — QUANTIFERON-TB GOLD PLUS
Mitogen-NIL: 7.71 [IU]/mL
NIL: 0.07 [IU]/mL
QuantiFERON-TB Gold Plus: NEGATIVE
TB1-NIL: 0.02 [IU]/mL
TB2-NIL: 0.03 [IU]/mL

## 2024-07-24 LAB — THIOPURINE METHYLTRANSFERASE (TPMT), RBC: Thiopurine Methyltransferase, RBC: 17 nmol/h/mL

## 2024-07-25 ENCOUNTER — Ambulatory Visit: Payer: Self-pay | Admitting: Nurse Practitioner

## 2024-07-25 NOTE — Progress Notes (Signed)
 Labs stable. She does have a very high peripheral eosinophil count, which is a WBC typically triggered by environmental causes (not a sign of infection). This can cause increased inflammation in the airway and be a cause of asthma. Will need to monitor her. May want to reconsider trial of inhaler if SOB/cough persist. Can discuss at follow up. Thanks!

## 2024-07-29 MED ORDER — AZATHIOPRINE 50 MG PO TABS: ORAL_TABLET | ORAL | 0 refills | Status: DC

## 2024-07-29 NOTE — Telephone Encounter (Signed)
 TPMT wnl. Okay to start azathioprine .   Reviewed previous counseling information.   Per chart message from Izetta Rouleau, NP, start azathioprine  at 25mg  daily.   Dosing will be:  - Weeks 1-2: Azathioprine  25mg  (one-half tab) by mouth daily. Repeat labs(CBC/CMP), assess tolerability. If labs stable and well tolerated,   - Weeks 3-4: Azathioprine  50mg  daily. Repeat labs (CBC/CMP), assess tolerability. If labs stable and well tolerated,   - Weeks 4-6: Azathioprine  100mg  daily. Repeat labs (CBC/CMP). If labs stable and well tolerated,   - Weeks 7 and thereafter: Azathioprine  150mg  daily.    Patient will alert team if she is unable to tolerate medication.   Patient knows she will need an appointment for lab at [facility] and plans to call to schedule, or go to walk in lab on [address].  Aleck Puls, PharmD, BCPS Clinical Pharmacist  [facility]

## 2024-07-29 NOTE — Addendum Note (Signed)
 Addended by: Thailan Sava L on: 07/29/2024 01:54 PM   Modules accepted: Orders

## 2024-08-01 ENCOUNTER — Ambulatory Visit: Payer: Self-pay | Admitting: Internal Medicine

## 2024-08-01 NOTE — Telephone Encounter (Signed)
 FYI Only or Action Required?: Action required by provider: clinical question for provider and update on patient condition.  Patient is followed in Pulmonology for asthma, ILD, OSA, pulmonary fibrosis, last seen on 07/15/2024 by Donna Comer GAILS, NP.  Called Nurse Triage reporting Medication Problem.  Symptoms began yesterday.  Interventions attempted: Nothing.  Symptoms are: unchanged.  Triage Disposition: Call PCP Now  Patient/caregiver understands and will follow disposition?: Yes       Copied from CRM [id number]. Topic: Clinical - Red Word Triage >> Aug 01, 2024  9:48 AM Donna Williamson wrote: Red Word that prompted transfer to Nurse Triage: Patient [phone] states had a terrible side effects Azathioprine  50 mg on-half tablet, the empty of her stomach/diarrhea yesterday and woke this morning very weak, lightheaded, wheezing, headaches and joint pains to the point she's wiped out. Patient states she takes a stool softner every night and maybe taking Azathioprine  made it worse. Patient has not noticed a rash. Patient want to speak with pharmacist Donna Williamson about this medication. Please advise.        Reason for Disposition  [1] Caller has URGENT medicine question about med that primary care doctor (or NP/PA) or specialist prescribed AND [2] triager unable to answer question  Answer Assessment - Initial Assessment Questions Patient reports she believes she is having a side effect to her newly prescribed Azathioprine  50mg . She states that she began to experience diarrhea last night. She states that this morning she feels wiped out, and a little lightheaded, but I haven't eaten today. Patient would like to know if she should stop taking the medication or if there was anything else Dr. Geronimo would like her to try. Please advise.   Patient instructed to call back for new or worsening symptoms. Patient verbalized understanding and agreement with this plan.       1. NAME of  MEDICINE: What medicine(s) are you calling about?     Azathioprine  50mg  2. QUESTION: What is your question? (e.g., double dose of medicine, side effect)     Side effects  3. PRESCRIBER: Who prescribed the medicine? Reason: if prescribed by specialist, call should be referred to that group.     Dr. Geronimo  4. SYMPTOMS: Do you have any symptoms? If Yes, ask: What symptoms are you having?  How bad are the symptoms (e.g., mild, moderate, severe)     Diarrhea, I feel wiped out, mild lightheadedness she believes is due to not eating yet  Protocols used: Medication Question Call-A-AH

## 2024-08-02 ENCOUNTER — Telehealth: Payer: Self-pay

## 2024-08-02 DIAGNOSIS — F331 Major depressive disorder, recurrent, moderate: Secondary | ICD-10-CM | POA: Diagnosis not present

## 2024-08-02 DIAGNOSIS — E876 Hypokalemia: Secondary | ICD-10-CM | POA: Diagnosis not present

## 2024-08-02 DIAGNOSIS — T887XXA Unspecified adverse effect of drug or medicament, initial encounter: Secondary | ICD-10-CM | POA: Diagnosis not present

## 2024-08-02 DIAGNOSIS — J849 Interstitial pulmonary disease, unspecified: Secondary | ICD-10-CM | POA: Diagnosis not present

## 2024-08-02 DIAGNOSIS — J84112 Idiopathic pulmonary fibrosis: Secondary | ICD-10-CM | POA: Diagnosis not present

## 2024-08-02 DIAGNOSIS — R5383 Other fatigue: Secondary | ICD-10-CM | POA: Diagnosis not present

## 2024-08-02 DIAGNOSIS — J8410 Pulmonary fibrosis, unspecified: Secondary | ICD-10-CM | POA: Diagnosis not present

## 2024-08-02 DIAGNOSIS — R197 Diarrhea, unspecified: Secondary | ICD-10-CM | POA: Diagnosis not present

## 2024-08-02 NOTE — Telephone Encounter (Signed)
 Patient contacted pharmacy line and triage with concern about azathioprine  side effect, see separate note. Called patient to discuss -   She took 3 doses of azathioprine  25mg  tablet once daily. I'm weak as a kitten. Reports she had significant GI side effects, intolerable diarrhea resulting in an accident.   She stopped the medication and reports improvement in symptoms, although she still feels weak. Reports she saw her PCP yesterday.   She asks about alternative treatment options. Encouraged her to discuss at next OV with Izetta Rouleau, NP on 09/12/24. Will alert Dr. Geronimo and Izetta Rouleau, NP, that she is unable to tolerate Imuran . Medication allergy list updated with intolerance to Imuran .   Patient verbalizes understanding and agreement with plan.  Aleck Puls, PharmD, BCPS, CPP Clinical Pharmacist  [facility]

## 2024-08-04 NOTE — Telephone Encounter (Signed)
 Right now not toelrated either immuran or cellcept .These were started due to exercise hypoxemia with ILD - sJogren  Plan  - ask her if she feels dyspnea worse? - next  option is to try Tyvaso nebulzier in clincial trial setting - this does not have immune suppression but is avialable as a trial -> I can evaluae her for this but need to know  -  if dyspnea is worse over the course of 1 year and 6 months  Thanks    SIGNATURE    Dr. Dorethia Cave, M.D., F.C.C.P,  Pulmonary and Critical Care Medicine Staff Physician, [facility] Director - Interstitial Lung Disease  Program  Pulmonary Fibrosis [facility] at [facility] [place], [place], [address]   Pager: [phone], If no answer  -> Check AMION or Try [phone] Telephone (clinical office): [phone] Telephone (research): [phone]  9:44 AM 08/04/2024        Latest Ref Rng & Units 03/15/2024   10:17 AM 09/27/2023   12:50 PM  PFT Results  FVC-Pre L 2.45  2.46   FVC-Predicted Pre % 87  86   FVC-Post L  2.50   FVC-Predicted Post %  88   Pre FEV1/FVC % % 78  78   Post FEV1/FCV % %  79   FEV1-Pre L 1.90  1.92   FEV1-Predicted Pre % 91  90   FEV1-Post L  1.98   DLCO uncorrected ml/min/mmHg 10.97  11.70   DLCO UNC% % 55  59   DLCO corrected ml/min/mmHg 12.45  11.70   DLCO COR %Predicted % 63  59   DLVA Predicted % 79  74   TLC L  4.82   TLC % Predicted %  91   RV % Predicted %  98

## 2024-08-04 NOTE — Progress Notes (Signed)
 Echo results there emight be mild aortic stenosis. We can discuss at followup OR talk to PCP Shayne Anes, MD OR cardiologist if you have one

## 2024-08-05 ENCOUNTER — Telehealth: Payer: Self-pay

## 2024-08-05 ENCOUNTER — Encounter

## 2024-08-05 ENCOUNTER — Other Ambulatory Visit: Payer: Self-pay | Admitting: Cardiovascular Disease

## 2024-08-05 NOTE — Telephone Encounter (Signed)
 Copied from CRM [id number]. Topic: Clinical - Red Word Triage >> Aug 01, 2024  9:48 AM [name]: Red Word that prompted transfer to Nurse Triage: Patient [phone] states had a terrible side effects Azathioprine  50 mg on-half tablet, the empty of her stomach/diarrhea yesterday and woke this morning very weak, lightheaded, wheezing, headaches and joint pains to the point she's wiped out. Patient states she takes a stool softner every night and maybe taking Azathioprine  made it worse. Patient has not noticed a rash. Patient want to speak with pharmacist Aleck about this medication. Please advise. >> Aug 02, 2024  2:21 PM Joesph PARAS wrote: Patient is calling to state she still has not heard back. Patient would like to hear from them Monday morning, please call patient back. Patient declined to speak to triage again.  >> Aug 01, 2024 11:30 AM Isabell A wrote: Patient calling to check on when someone will call her back - advised message has been sent to her provider.      Pharmacy has tried to reach out to patient in regards to Rx. VM/ LM to return call    Please contact Pharmacy    -NFN

## 2024-08-05 NOTE — Telephone Encounter (Signed)
 ATC x1.  No answer.  LVM to return call.  Mychart message sent.

## 2024-08-06 ENCOUNTER — Other Ambulatory Visit

## 2024-08-06 ENCOUNTER — Other Ambulatory Visit (HOSPITAL_COMMUNITY)

## 2024-08-06 DIAGNOSIS — J189 Pneumonia, unspecified organism: Secondary | ICD-10-CM | POA: Diagnosis not present

## 2024-08-07 ENCOUNTER — Telehealth: Payer: Self-pay | Admitting: *Deleted

## 2024-08-07 DIAGNOSIS — M170 Bilateral primary osteoarthritis of knee: Secondary | ICD-10-CM | POA: Diagnosis not present

## 2024-08-07 DIAGNOSIS — M25561 Pain in right knee: Secondary | ICD-10-CM | POA: Diagnosis not present

## 2024-08-07 DIAGNOSIS — M25562 Pain in left knee: Secondary | ICD-10-CM | POA: Diagnosis not present

## 2024-08-07 NOTE — Telephone Encounter (Signed)
 Copied from CRM [id number]. Topic: Appointments - Scheduling Inquiry for Clinic >> Aug 02, 2024  3:45 PM Leila BROCKS wrote: Reason for CRM: Patient [phone] wants to reschedule appointments, wants to be seen sooner, 09/12/24 at 3 pm PFT and 4 pm with NP, Cobb. There's no earlier appointments, and would like to be seen sooner. Patient states the NP, Cobb's office did not call her yesterday as advised from NT. Patient went to see pcp today, pcp advised patient to stop the medication Azathioprine  50 mg, because patient is still weak, was treated and got blood work today. [facility] pharmacist about 20 minutes today. Patient want to see NP, Cobb sooner, please advise and call back.  I called and spoke with the pt. She states that after speaking with the pharmacist here and then seeing her PCP, she feels comfortable keeping her appt for November. She is currently feeling well. Nothing further needed.

## 2024-08-07 NOTE — Telephone Encounter (Signed)
 Patient responded to mychart message.  These were her responses:  1.  No my shortness of breath is about the same.  I am not using any inhalers. 2.  I was using Symbicort for my asthma before but I have not used in several months.  Please advise.  Thank you.

## 2024-08-08 ENCOUNTER — Telehealth (HOSPITAL_COMMUNITY): Payer: Self-pay

## 2024-08-08 NOTE — Telephone Encounter (Signed)
 Patient called back to get scheduled in the Pulmonary Rehab Program. Patient will come in for orientation on 10/27 and will attend the 10:15 exercise class.  Sent MyChart message.

## 2024-08-08 NOTE — Telephone Encounter (Signed)
 Attempted f/u call to schedule pulmonary rehab- no answer, left message. Sent MyChart message.  Closing referral.

## 2024-08-08 NOTE — Telephone Encounter (Signed)
   Given stability in dyspnea, just wait till Nov 2025 PFT but see if I can see her sooner than the the Dec visit  I can discuss new oral drug that just got approved 2 days ago  (GI side effects) OR Tyvaso trial     Latest Ref Rng & Units 03/15/2024   10:17 AM 09/27/2023   12:50 PM  PFT Results  FVC-Pre L 2.45  2.46   FVC-Predicted Pre % 87  86   FVC-Post L  2.50   FVC-Predicted Post %  88   Pre FEV1/FVC % % 78  78   Post FEV1/FCV % %  79   FEV1-Pre L 1.90  1.92   FEV1-Predicted Pre % 91  90   FEV1-Post L  1.98   DLCO uncorrected ml/min/mmHg 10.97  11.70   DLCO UNC% % 55  59   DLCO corrected ml/min/mmHg 12.45  11.70   DLCO COR %Predicted % 63  59   DLVA Predicted % 79  74   TLC L  4.82   TLC % Predicted %  91   RV % Predicted %  98

## 2024-08-08 NOTE — Telephone Encounter (Signed)
 Sonny, please see if you can get pt moved to MR's schedule after her 11/13 PFT. She's tried and failed two drugs at this point. Thanks!

## 2024-08-13 DIAGNOSIS — J189 Pneumonia, unspecified organism: Secondary | ICD-10-CM | POA: Diagnosis not present

## 2024-08-13 NOTE — Telephone Encounter (Signed)
 Hi Ms. Suzi, Hernan would like for you to see Dr. Geronimo after your PFT on 11/13. Dr. Ubaldo next available appointment is 11/19 at 2:45. Will this work with your schedule? If this appointments works with your schedule, we will cancel your appointment with Pagan for 11/13 following your breathing test.  I have attempted to reach you via telephone to schedule.

## 2024-08-13 NOTE — Telephone Encounter (Signed)
 Appointment scheduled for 11/19.  Nothing further needed.

## 2024-08-17 DIAGNOSIS — Z23 Encounter for immunization: Secondary | ICD-10-CM | POA: Diagnosis not present

## 2024-08-22 ENCOUNTER — Telehealth: Payer: Self-pay

## 2024-08-23 ENCOUNTER — Telehealth (HOSPITAL_COMMUNITY): Payer: Self-pay

## 2024-08-23 NOTE — Telephone Encounter (Signed)
 Called pt to confirm PR appointment for 08/26/24. No answer. Left VM.

## 2024-08-26 ENCOUNTER — Encounter (HOSPITAL_COMMUNITY)
Admission: RE | Admit: 2024-08-26 | Discharge: 2024-08-26 | Disposition: A | Discharge status: Home / Self Care | Source: Ambulatory Visit | Attending: Internal Medicine | Admitting: Internal Medicine

## 2024-08-26 ENCOUNTER — Encounter (HOSPITAL_COMMUNITY): Payer: Self-pay

## 2024-08-26 VITALS — BP 122/64 | HR 74 | Wt 201.3 lb

## 2024-08-26 DIAGNOSIS — J849 Interstitial pulmonary disease, unspecified: Secondary | ICD-10-CM | POA: Diagnosis not present

## 2024-08-26 NOTE — Progress Notes (Signed)
 Pulmonary Individual Treatment Plan  Patient Details  Name: Donna Williamson MRN: [id number] Date of Birth: 08-Apr-1946 Referring Provider:   Conrad Ports Pulmonary Rehab Walk Test from 08/26/2024 in [facility]  Referring Provider Ramaswamy    Initial Encounter Date:  Flowsheet Row Pulmonary Rehab Walk Test from 08/26/2024 in [facility]  Date 08/26/24    Visit Diagnosis: ILD (interstitial lung disease) (HCC)  Patient's Home Medications on Admission:   Current Outpatient Medications:    acetaminophen  (TYLENOL ) 500 MG tablet, Take 500-1,000 mg by mouth every 6 (six) hours as needed for moderate pain (pain score 4-6)., Disp: , Rfl:    albuterol  (PROVENTIL ) (2.5 MG/3ML) 0.083% nebulizer solution, Take 3 mLs (2.5 mg total) by nebulization every 2 (two) hours as needed for wheezing or shortness of breath., Disp: 75 mL, Rfl: 2   ALPRAZolam  (XANAX ) 0.5 MG tablet, Take 0.5 mg by mouth daily as needed for anxiety. , Disp: , Rfl:    aspirin  EC 81 MG tablet, Take 81 mg by mouth daily., Disp: , Rfl:    benzonatate  (TESSALON ) 200 MG capsule, Take 200 mg by mouth 3 (three) times daily as needed for cough., Disp: , Rfl:    carboxymethylcellulose (REFRESH PLUS) 0.5 % SOLN, Place 1 drop into both eyes daily as needed (dry eyes)., Disp: , Rfl:    Cholecalciferol  (VITAMIN D3) 2000 UNITS TABS, Take 2,000 Units by mouth daily., Disp: , Rfl:    cyanocobalamin  500 MCG tablet, Take 1,000 mcg by mouth daily., Disp: , Rfl:    EPIPEN  2-PAK 0.3 MG/0.3ML SOAJ injection, Inject 0.3 mg into the skin daily as needed (allergic reaction). , Disp: , Rfl:    ezetimibe  (ZETIA ) 10 MG tablet, Take 10 mg by mouth at bedtime., Disp: , Rfl:    FLUoxetine  (PROZAC ) 20 MG capsule, Take 20 mg by mouth daily. (Patient taking differently: Take 40 mg by mouth daily.), Disp: , Rfl:    fluticasone  (FLONASE ) 50 MCG/ACT  nasal spray, Place 2 sprays into the nose daily., Disp: , Rfl:    furosemide  (LASIX ) 20 MG tablet, Take 1 tablet (20 mg total) by mouth daily., Disp: 90 tablet, Rfl: 3   gabapentin  (NEURONTIN ) 100 MG capsule, Take 100 mg by mouth daily., Disp: , Rfl:    inclisiran (LEQVIO ) 284 MG/1.5ML SOSY injection, Inject 284 mg into the skin once., Disp: , Rfl:    isosorbide  mononitrate (IMDUR ) 30 MG 24 hr tablet, TAKE 1 TABLET(30 MG) BY MOUTH DAILY, Disp: 90 tablet, Rfl: 0   metoprolol  tartrate (LOPRESSOR ) 25 MG tablet, TAKE 1/2 TABLET(12.5 MG) BY MOUTH TWICE DAILY, Disp: 90 tablet, Rfl: 3   montelukast  (SINGULAIR ) 10 MG tablet, Take 10 mg by mouth at bedtime., Disp: , Rfl:    Multiple Vitamin (MULTIVITAMIN WITH MINERALS) TABS tablet, Take 1 tablet by mouth daily., Disp: , Rfl:    nitroGLYCERIN  (NITROSTAT ) 0.4 MG SL tablet, Place 1 tablet (0.4 mg total) under the tongue every 5 (five) minutes as needed for chest pain (up to 3 doses. If taking 3rd dose call 911)., Disp: 25 tablet, Rfl: 3   pantoprazole  (PROTONIX ) 40 MG tablet, TAKE 2 TABLETS(80 MG) BY MOUTH DAILY, Disp: 180 tablet, Rfl: 3   potassium chloride  (KLOR-CON ) 10 MEQ tablet, Take 10 mEq by mouth See admin instructions. Take 1 tablet (10 mEq) by mouth on Monday's and Thursday's., Disp: , Rfl:    PROAIR  HFA  108 (90 BASE) MCG/ACT inhaler, Inhale 1-2 puffs into the lungs every 4 (four) hours as needed for wheezing., Disp: , Rfl:    rosuvastatin  (CRESTOR ) 20 MG tablet, Take 1 tablet (20 mg total) by mouth daily at 6 PM., Disp: 30 tablet, Rfl: 0   budesonide-formoterol  (SYMBICORT) 160-4.5 MCG/ACT inhaler, Inhale 2 puffs into the lungs 2 (two) times daily. (Patient not taking: Reported on 08/26/2024), Disp: , Rfl:    cefdinir  (OMNICEF ) 300 MG capsule, Take 1 capsule (300 mg total) by mouth 2 (two) times daily. (Patient not taking: Reported on 08/26/2024), Disp: 14 capsule, Rfl: 0   dextromethorphan -guaiFENesin  (MUCINEX  DM) 30-600 MG 12hr tablet, Take 1 tablet  by mouth 2 (two) times daily. (Patient not taking: Reported on 08/26/2024), Disp: 30 tablet, Rfl: 0   diltiazem  (CARDIZEM  CD) 120 MG 24 hr capsule, Take 120 mg by mouth daily. (Patient not taking: Reported on 08/26/2024), Disp: , Rfl:    estradiol  (ESTRACE ) 0.5 MG tablet, Take 0.5 mg by mouth daily. (Patient not taking: Reported on 08/26/2024), Disp: , Rfl:   Past Medical History: Past Medical History:  Diagnosis Date   Anemia    Anginal pain    Anxiety    Arthritis    feet, knees, back (05/02/2016)   Asthma    CAD (coronary artery disease)    a. 04/2016: NSTEMI 95% stenosis 1st Mrg (s/p DES)   Chest pain    a. 10/2013 Exercise Myoview : Ef 65%, no ischemia.   CHF (congestive heart failure) (HCC)    Chronic bronchitis (HCC)    Chronic lower back pain    COPD (chronic obstructive pulmonary disease) (HCC)    GERD (gastroesophageal reflux disease)    History of blood transfusion 1960s   related to my hysterectomy   Hypercholesterolemia    Hypertension    Lower extremity edema    Mild aortic stenosis 03/07/2023   TTE 02/28/2023: EF 55-60, no RWMA, mild asymmetric LVH, NL RVSF, mild MR, mild aortic stenosis (bulky subvalvular calcification), mean AV gradient 9.6, V-max 199 cm/s, RAP 3   Mitral valve prolapse    NSTEMI (non-ST elevated myocardial infarction) (HCC) 05/01/2016   Pneumonia 05/01/2016   saw trace of slight pneumonia/CT scan (05/02/2016)   Seasonal allergies    Sjogren's syndrome    Wheezing     Tobacco Use: Social History   Tobacco Use  Smoking Status Never   Passive exposure: Past  Smokeless Tobacco Never    Labs: Review Flowsheet  More data exists      Latest Ref Rng & Units 05/02/2016 05/03/2016 08/09/2016 02/16/2021 02/17/2021  Labs for ITP Cardiac and Pulmonary Rehab  Cholestrol 0 - 200 mg/dL 813  813  867  - 868   LDL (calc) 0 - 99 mg/dL 84  80  50  - 59   HDL-C >40 mg/dL 79  77  42  - 46   Trlycerides <150 mg/dL 883  855  798  - 867   Hemoglobin A1c 4.8  - 5.6 % - - - 6.1  -  PH, Arterial 7.350 - 7.450 7.414  - - - -  PCO2 arterial 35.0 - 45.0 mmHg 35.6  - - - -  Bicarbonate 20.0 - 24.0 mEq/L 22.8  24.7  - - - -  TCO2 0 - 100 mmol/L 24  26  - - - -  Acid-base deficit 0.0 - 2.0 mmol/L 1.0  - - - -  O2 Saturation % 92.0  66.0  - - - -  Details       Multiple values from one day are sorted in reverse-chronological order         Capillary Blood Glucose: Lab Results  Component Value Date   GLUCAP 93 02/17/2021   GLUCAP 133 (H) 05/04/2016   GLUCAP 146 (H) 05/04/2016   GLUCAP 148 (H) 05/04/2016   GLUCAP 147 (H) 05/03/2016     Pulmonary Assessment Scores:  Pulmonary Assessment Scores     Row Name 08/26/24 1336         ADL UCSD   ADL Phase Entry     SOB Score total 44       CAT Score   CAT Score 20       mMRC Score   mMRC Score 4       UCSD: Self-administered rating of dyspnea associated with activities of daily living (ADLs) 6-point scale (0 = not at all to 5 = maximal or unable to do because of breathlessness)  Scoring Scores range from 0 to 120.  Minimally important difference is 5 units  CAT: CAT can identify the health impairment of COPD patients and is better correlated with disease progression.  CAT has a scoring range of zero to 40. The CAT score is classified into four groups of low (less than 10), medium (10 - 20), high (21-30) and very high (31-40) based on the impact level of disease on health status. A CAT score over 10 suggests significant symptoms.  A worsening CAT score could be explained by an exacerbation, poor medication adherence, poor inhaler technique, or progression of COPD or comorbid conditions.  CAT MCID is 2 points  mMRC: mMRC (Modified Medical Research Council) Dyspnea Scale is used to assess the degree of baseline functional disability in patients of respiratory disease due to dyspnea. No minimal important difference is established. A decrease in score of 1 point or greater is  considered a positive change.   Pulmonary Function Assessment:  Pulmonary Function Assessment - 08/26/24 1410       Breath   Bilateral Breath Sounds Decreased    Shortness of Breath Yes;Limiting activity          Exercise Target Goals: Exercise Program Goal: Individual exercise prescription set using results from initial 6 min walk test and THRR while considering  patient's activity barriers and safety.   Exercise Prescription Goal: Initial exercise prescription builds to 30-45 minutes a day of aerobic activity, 2-3 days per week.  Home exercise guidelines will be given to patient during program as part of exercise prescription that the participant will acknowledge.  Activity Barriers & Risk Stratification:  Activity Barriers & Cardiac Risk Stratification - 08/26/24 1400       Activity Barriers & Cardiac Risk Stratification   Activity Barriers Back Problems;Muscular Weakness;Shortness of Breath;Deconditioning;Left Hip Replacement;History of Falls;Balance Concerns    Cardiac Risk Stratification Moderate          6 Minute Walk:  6 Minute Walk     Row Name 08/26/24 1519         6 Minute Walk   Phase Initial     Distance 240 feet     Walk Time 6 minutes     # of Rest Breaks 2  1:40-2:50; 3:44-6:00     MPH 0.45     METS 0.17     RPE 13     Perceived Dyspnea  1     VO2 Peak 0.58     Symptoms Yes (comment)  Comments dizzy during walk test     Resting HR 76 bpm     Resting BP 122/64     Resting Oxygen  Saturation  99 %     Exercise Oxygen  Saturation  during 6 min walk 86 %     Max Ex. HR 97 bpm     Max Ex. BP 138/66     2 Minute Post BP 134/64       Interval HR   1 Minute HR 91     2 Minute HR 97     3 Minute HR 94     4 Minute HR 93     5 Minute HR 87     6 Minute HR 74     2 Minute Post HR 73     Interval Heart Rate? Yes       Interval Oxygen    Interval Oxygen ? Yes     Baseline Oxygen  Saturation % 99 %     1 Minute Oxygen  Saturation % 97 %     1  Minute Liters of Oxygen  2 L     2 Minute Oxygen  Saturation % 86 %     2 Minute Liters of Oxygen  2 L  increased to 3L     3 Minute Oxygen  Saturation % 93 %     3 Minute Liters of Oxygen  3 L     4 Minute Oxygen  Saturation % 93 %     4 Minute Liters of Oxygen  3 L     5 Minute Oxygen  Saturation % 92 %     5 Minute Liters of Oxygen  3 L     6 Minute Oxygen  Saturation % 97 %     6 Minute Liters of Oxygen  3 L     2 Minute Post Oxygen  Saturation % 99 %     2 Minute Post Liters of Oxygen  3 L        Oxygen  Initial Assessment:  Oxygen  Initial Assessment - 08/26/24 1401       Home Oxygen    Home Oxygen  Device Home Concentrator;Portable Concentrator;E-Tanks    Sleep Oxygen  Prescription Continuous    Liters per minute 2    Home Exercise Oxygen  Prescription Continuous    Liters per minute 4    Home Resting Oxygen  Prescription Continuous    Liters per minute 2    Compliance with Home Oxygen  Use Yes      Initial 6 min Walk   Oxygen  Used Continuous    Liters per minute 3      Program Oxygen  Prescription   Program Oxygen  Prescription Continuous    Liters per minute 3      Intervention   Short Term Goals To learn and exhibit compliance with exercise, home and travel O2 prescription;To learn and understand importance of maintaining oxygen  saturations>88%;To learn and demonstrate proper use of respiratory medications;To learn and understand importance of monitoring SPO2 with pulse oximeter and demonstrate accurate use of the pulse oximeter.;To learn and demonstrate proper pursed lip breathing techniques or other breathing techniques.     Long  Term Goals Exhibits compliance with exercise, home  and travel O2 prescription;Maintenance of O2 saturations>88%;Compliance with respiratory medication;Verbalizes importance of monitoring SPO2 with pulse oximeter and return demonstration;Exhibits proper breathing techniques, such as pursed lip breathing or other method taught during program session;Demonstrates  proper use of MDI's          Oxygen  Re-Evaluation:   Oxygen  Discharge (Final Oxygen  Re-Evaluation):   Initial Exercise Prescription:  Initial Exercise Prescription - 08/26/24 1500       Date of Initial Exercise RX and Referring Provider   Date 08/26/24    Referring Provider Ramaswamy    Expected Discharge Date 12/05/24      Oxygen    Oxygen  Continuous    Liters 3    Maintain Oxygen  Saturation 88% or higher      Recumbant Bike   Level 1    Minutes 15    METs 1.5      NuStep   Level 1    SPM 65    Minutes 15    METs 1.8      Prescription Details   Frequency (times per week) 2    Duration Progress to 30 minutes of continuous aerobic without signs/symptoms of physical distress      Intensity   THRR 40-80% of Max Heartrate 56-112    Ratings of Perceived Exertion 11-13    Perceived Dyspnea 0-4      Progression   Progression Continue to progress workloads to maintain intensity without signs/symptoms of physical distress.      Resistance Training   Training Prescription Yes    Weight red bands    Reps 10-15          Perform Capillary Blood Glucose checks as needed.  Exercise Prescription Changes:   Exercise Comments:   Exercise Goals and Review:   Exercise Goals     Row Name 08/26/24 1333             Exercise Goals   Increase Physical Activity Yes       Intervention Provide advice, education, support and counseling about physical activity/exercise needs.;Develop an individualized exercise prescription for aerobic and resistive training based on initial evaluation findings, risk stratification, comorbidities and participant's personal goals.       Expected Outcomes Short Term: Attend rehab on a regular basis to increase amount of physical activity.;Long Term: Exercising regularly at least 3-5 days a week.;Long Term: Add in home exercise to make exercise part of routine and to increase amount of physical activity.       Increase Strength and  Stamina Yes       Intervention Provide advice, education, support and counseling about physical activity/exercise needs.;Develop an individualized exercise prescription for aerobic and resistive training based on initial evaluation findings, risk stratification, comorbidities and participant's personal goals.       Expected Outcomes Short Term: Increase workloads from initial exercise prescription for resistance, speed, and METs.;Short Term: Perform resistance training exercises routinely during rehab and add in resistance training at home;Long Term: Improve cardiorespiratory fitness, muscular endurance and strength as measured by increased METs and functional capacity ( )       Able to understand and use rate of perceived exertion (RPE) scale Yes       Intervention Provide education and explanation on how to use RPE scale       Expected Outcomes Short Term: Able to use RPE daily in rehab to express subjective intensity level;Long Term:  Able to use RPE to guide intensity level when exercising independently       Able to understand and use Dyspnea scale Yes       Intervention Provide education and explanation on how to use Dyspnea scale       Expected Outcomes Short Term: Able to use Dyspnea scale daily in rehab to express subjective sense of shortness of breath during exertion;Long Term: Able to use Dyspnea scale to guide intensity level  when exercising independently       Knowledge and understanding of Target Heart Rate Range (THRR) Yes       Intervention Provide education and explanation of THRR including how the numbers were predicted and where they are located for reference       Expected Outcomes Short Term: Able to state/look up THRR;Long Term: Able to use THRR to govern intensity when exercising independently;Short Term: Able to use daily as guideline for intensity in rehab       Understanding of Exercise Prescription Yes       Intervention Provide education, explanation, and written  materials on patient's individual exercise prescription       Expected Outcomes Short Term: Able to explain program exercise prescription;Long Term: Able to explain home exercise prescription to exercise independently          Exercise Goals Re-Evaluation :   Discharge Exercise Prescription (Final Exercise Prescription Changes):   Nutrition:  Target Goals: Understanding of nutrition guidelines, daily intake of sodium 1500mg , cholesterol 200mg , calories 30% from fat and 7% or less from saturated fats, daily to have 5 or more servings of fruits and vegetables.  Biometrics:  Pre Biometrics - 08/26/24 1525       Pre Biometrics   Grip Strength 6 kg           Nutrition Therapy Plan and Nutrition Goals:   Nutrition Assessments:  MEDIFICTS Score Key: >=70 Need to make dietary changes  40-70 Heart Healthy Diet <= 40 Therapeutic Level Cholesterol Diet   Picture Your Plate Scores: <59 Unhealthy dietary pattern with much room for improvement. 41-50 Dietary pattern unlikely to meet recommendations for good health and room for improvement. 51-60 More healthful dietary pattern, with some room for improvement.  >60 Healthy dietary pattern, although there may be some specific behaviors that could be improved.    Nutrition Goals Re-Evaluation:   Nutrition Goals Discharge (Final Nutrition Goals Re-Evaluation):   Psychosocial: Target Goals: Acknowledge presence or absence of significant depression and/or stress, maximize coping skills, provide positive support system. Participant is able to verbalize types and ability to use techniques and skills needed for reducing stress and depression.  Initial Review & Psychosocial Screening:  Initial Psych Review & Screening - 08/26/24 1400       Initial Review   Current issues with History of Depression;Current Psychotropic Meds      Family Dynamics   Good Support System? Yes    Comments support from husband      Barriers    Psychosocial barriers to participate in program There are no identifiable barriers or psychosocial needs.      Screening Interventions   Interventions Encouraged to exercise    Expected Outcomes Short Term goal: Identification and review with participant of any Quality of Life or Depression concerns found by scoring the questionnaire.;Long Term goal: The participant improves quality of Life and PHQ9 Scores as seen by post scores and/or verbalization of changes          Quality of Life Scores:  Scores of 19 and below usually indicate a poorer quality of life in these areas.  A difference of  2-3 points is a clinically meaningful difference.  A difference of 2-3 points in the total score of the Quality of Life Index has been associated with significant improvement in overall quality of life, self-image, physical symptoms, and general health in studies assessing change in quality of life.  PHQ-9: Review Flowsheet  More data exists  08/26/2024 09/26/2016 06/06/2016 04/02/2016 10/17/2015  Depression screen PHQ 2/9  Decreased Interest 0 0 0 0 0  Down, Depressed, Hopeless 0 0 0 0 0  PHQ - 2 Score 0 0 0 0 0  Altered sleeping 2 - - - -  Tired, decreased energy 3 - - - -  Change in appetite 1 - - - -  Feeling bad or failure about yourself  2 - - - -  Trouble concentrating 0 - - - -  Moving slowly or fidgety/restless 0 - - - -  Suicidal thoughts 0 - - - -  PHQ-9 Score 8 - - - -  Difficult doing work/chores Somewhat difficult - - - -   Interpretation of Total Score  Total Score Depression Severity:  1-4 = Minimal depression, 5-9 = Mild depression, 10-14 = Moderate depression, 15-19 = Moderately severe depression, 20-27 = Severe depression   Psychosocial Evaluation and Intervention:  Psychosocial Evaluation - 08/26/24 1513       Psychosocial Evaluation & Interventions   Interventions Encouraged to exercise with the program and follow exercise prescription    Comments Dayla states she  has been dealing with depression in the last year due to her declining health and the passing of her mother. She has reached out to her primary doctor and he increased her psychotropic meds. She feels the increase in her meds has helped with her mental health. She denies needing a referral to a mental health specialist at this time.    Expected Outcomes For Dayla to have less depression while participating in PR    Continue Psychosocial Services  Follow up required by staff          Psychosocial Re-Evaluation:   Psychosocial Discharge (Final Psychosocial Re-Evaluation):   Education: Education Goals: Education classes will be provided on a weekly basis, covering required topics. Participant will state understanding/return demonstration of topics presented.  Learning Barriers/Preferences:  Learning Barriers/Preferences - 08/26/24 1400       Learning Barriers/Preferences   Learning Barriers Sight    Learning Preferences None          Education Topics: Know Your Numbers Group instruction that is supported by a PowerPoint presentation. Instructor discusses importance of knowing and understanding resting, exercise, and post-exercise oxygen  saturation, heart rate, and blood pressure. Oxygen  saturation, heart rate, blood pressure, rating of perceived exertion, and dyspnea are reviewed along with a normal range for these values.    Exercise for the Pulmonary Patient Group instruction that is supported by a PowerPoint presentation. Instructor discusses benefits of exercise, core components of exercise, frequency, duration, and intensity of an exercise routine, importance of utilizing pulse oximetry during exercise, safety while exercising, and options of places to exercise outside of rehab.    MET Level  Group instruction provided by PowerPoint, verbal discussion, and written material to support subject matter. Instructor reviews what METs are and how to increase METs.    Pulmonary  Medications Verbally interactive group education provided by instructor with focus on inhaled medications and proper administration.   Anatomy and Physiology of the Respiratory System Group instruction provided by PowerPoint, verbal discussion, and written material to support subject matter. Instructor reviews respiratory cycle and anatomical components of the respiratory system and their functions. Instructor also reviews differences in obstructive and restrictive respiratory diseases with examples of each.    Oxygen  Safety Group instruction provided by PowerPoint, verbal discussion, and written material to support subject matter. There is an overview of "What is Oxygen " and "  Why do we need it".  Instructor also reviews how to create a safe environment for oxygen  use, the importance of using oxygen  as prescribed, and the risks of noncompliance. There is a brief discussion on traveling with oxygen  and resources the patient may utilize.   Oxygen  Use Group instruction provided by PowerPoint, verbal discussion, and written material to discuss how supplemental oxygen  is prescribed and different types of oxygen  supply systems. Resources for more information are provided.    Breathing Techniques Group instruction that is supported by demonstration and informational handouts. Instructor discusses the benefits of pursed lip and diaphragmatic breathing and detailed demonstration on how to perform both.     Risk Factor Reduction Group instruction that is supported by a PowerPoint presentation. Instructor discusses the definition of a risk factor, different risk factors for pulmonary disease, and how the heart and lungs work together.   Pulmonary Diseases Group instruction provided by PowerPoint, verbal discussion, and written material to support subject matter. Instructor gives an overview of the different type of pulmonary diseases. There is also a discussion on risk factors and symptoms as well as  ways to manage the diseases.   Stress and Energy Conservation Group instruction provided by PowerPoint, verbal discussion, and written material to support subject matter. Instructor gives an overview of stress and the impact it can have on the body. Instructor also reviews ways to reduce stress. There is also a discussion on energy conservation and ways to conserve energy throughout the day.   Warning Signs and Symptoms Group instruction provided by PowerPoint, verbal discussion, and written material to support subject matter. Instructor reviews warning signs and symptoms of stroke, heart attack, cold and flu. Instructor also reviews ways to prevent the spread of infection.   Other Education Group or individual verbal, written, or video instructions that support the educational goals of the pulmonary rehab program.    Knowledge Questionnaire Score:  Knowledge Questionnaire Score - 08/26/24 1333       Knowledge Questionnaire Score   Pre Score 17/18          Core Components/Risk Factors/Patient Goals at Admission:  Personal Goals and Risk Factors at Admission - 08/26/24 1400       Core Components/Risk Factors/Patient Goals on Admission   Improve shortness of breath with ADL's Yes    Intervention Provide education, individualized exercise plan and daily activity instruction to help decrease symptoms of SOB with activities of daily living.    Expected Outcomes Short Term: Improve cardiorespiratory fitness to achieve a reduction of symptoms when performing ADLs;Long Term: Be able to perform more ADLs without symptoms or delay the onset of symptoms          Core Components/Risk Factors/Patient Goals Review:    Core Components/Risk Factors/Patient Goals at Discharge (Final Review):    ITP Comments:   Comments: Dr. Slater Staff is Medical Director for Pulmonary Rehab at [facility].

## 2024-08-27 ENCOUNTER — Telehealth: Payer: Self-pay

## 2024-08-27 ENCOUNTER — Ambulatory Visit: Attending: Cardiovascular Disease

## 2024-08-27 DIAGNOSIS — I493 Ventricular premature depolarization: Secondary | ICD-10-CM

## 2024-08-27 NOTE — Telephone Encounter (Signed)
-----   Message from Lamarr Hummer sent at 08/26/2024  2:25 PM EDT ----- Regarding: RE: Frequent PACs and PVCs Hey Laquiesha Piacente!!  I think as long as she is not having any dizziness she is okay to proceed with rehab. I will have Dr. Margurite nurse send her a 3 day Zio XT to assess her rhythm and rule out afib or other arrhythmias.   Payton, can you call Ms Danner and let her know we want her to wear a monitor and get that arranged?  Thank you !  Katie ----- Message ----- From: Hummer Lamarr SAUNDERS, PA-C Sent: 08/26/2024   2:28 PM EDT To: Ronal Levin, RN Subject: RE: Frequent PACs and PVCs                     Hey Janeli Lewison!!  I think as long as she is not having any dizziness she is okay to proceed with rehab. I will have Dr. Margurite nurse send her a 3 day Zio XT to assess her rhythm and rule out afib or other arrhythmias.   Payton, can you call Ms Renn and let her know we want her to wear a monitor and get that arranged?  Thank you !  Katie ----- Message ----- From: Levin Ronal, RN Sent: 08/26/2024   2:17 PM EDT To: Lamarr SAUNDERS Hummer, PA-C Subject: Frequent PACs and PVCs                         Hey Katie,   Pt here in [facility] today for orientation. While obtaining BP noticed irregular heart rate, placed on zoll and pt having frequent PVCs with occasional PACs. Pt stated after hospitalization for pneumonia her diltizam was discontinued. She is currently taking metoprolol  12.5mg  BID. Pt reports symptoms of dizziness, lightheadedness, palpitations, fluttering, and SOB. 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 Her BP today was 122/66, HR 74, 99% on 2L [place]. She has an appointment coming up with Dr. Wonda at the end of November. Any recommendations from you or advice? Is she okay to exercise in Pulm Rehab if she is asymptomatic that day?  Thanks in advance

## 2024-08-27 NOTE — Progress Notes (Unsigned)
 Enrolled for [employer] to mail a ZIO XT long term holter monitor to the patients address on file.

## 2024-08-27 NOTE — Telephone Encounter (Signed)
 Spoke with pt and let her know the recommendations of Izetta Hummer, PA-C with wearing a 3 day zio monitor. Pt verbalized understanding of plan and had no further questions at this time. Order has been placed, zio instructions have been sent to pt via MyChart.

## 2024-08-28 DIAGNOSIS — H18593 Other hereditary corneal dystrophies, bilateral: Secondary | ICD-10-CM | POA: Diagnosis not present

## 2024-08-28 DIAGNOSIS — Z961 Presence of intraocular lens: Secondary | ICD-10-CM | POA: Diagnosis not present

## 2024-08-28 DIAGNOSIS — H43813 Vitreous degeneration, bilateral: Secondary | ICD-10-CM | POA: Diagnosis not present

## 2024-08-28 DIAGNOSIS — Z0100 Encounter for examination of eyes and vision without abnormal findings: Secondary | ICD-10-CM | POA: Diagnosis not present

## 2024-08-28 NOTE — Progress Notes (Signed)
 Pulmonary Individual Treatment Plan  Patient Details  Name: Donna Williamson MRN: [id number] Date of Birth: 1946-07-21 Referring Provider:   Conrad Ports Pulmonary Rehab Walk Test from 08/26/2024 in [facility]  Referring Provider Ramaswamy    Initial Encounter Date:  Flowsheet Row Pulmonary Rehab Walk Test from 08/26/2024 in [facility]  Date 08/26/24    Visit Diagnosis: ILD (interstitial lung disease) (HCC)  Patient's Home Medications on Admission:   Current Outpatient Medications:    acetaminophen  (TYLENOL ) 500 MG tablet, Take 500-1,000 mg by mouth every 6 (six) hours as needed for moderate pain (pain score 4-6)., Disp: , Rfl:    albuterol  (PROVENTIL ) (2.5 MG/3ML) 0.083% nebulizer solution, Take 3 mLs (2.5 mg total) by nebulization every 2 (two) hours as needed for wheezing or shortness of breath., Disp: 75 mL, Rfl: 2   ALPRAZolam  (XANAX ) 0.5 MG tablet, Take 0.5 mg by mouth daily as needed for anxiety. , Disp: , Rfl:    aspirin  EC 81 MG tablet, Take 81 mg by mouth daily., Disp: , Rfl:    benzonatate  (TESSALON ) 200 MG capsule, Take 200 mg by mouth 3 (three) times daily as needed for cough., Disp: , Rfl:    carboxymethylcellulose (REFRESH PLUS) 0.5 % SOLN, Place 1 drop into both eyes daily as needed (dry eyes)., Disp: , Rfl:    Cholecalciferol  (VITAMIN D3) 2000 UNITS TABS, Take 2,000 Units by mouth daily., Disp: , Rfl:    cyanocobalamin  500 MCG tablet, Take 1,000 mcg by mouth daily., Disp: , Rfl:    EPIPEN  2-PAK 0.3 MG/0.3ML SOAJ injection, Inject 0.3 mg into the skin daily as needed (allergic reaction). , Disp: , Rfl:    ezetimibe  (ZETIA ) 10 MG tablet, Take 10 mg by mouth at bedtime., Disp: , Rfl:    FLUoxetine  (PROZAC ) 20 MG capsule, Take 20 mg by mouth daily. (Patient taking differently: Take 40 mg by mouth daily.), Disp: , Rfl:    fluticasone  (FLONASE ) 50 MCG/ACT  nasal spray, Place 2 sprays into the nose daily., Disp: , Rfl:    furosemide  (LASIX ) 20 MG tablet, Take 1 tablet (20 mg total) by mouth daily., Disp: 90 tablet, Rfl: 3   gabapentin  (NEURONTIN ) 100 MG capsule, Take 100 mg by mouth daily., Disp: , Rfl:    inclisiran (LEQVIO ) 284 MG/1.5ML SOSY injection, Inject 284 mg into the skin once., Disp: , Rfl:    isosorbide  mononitrate (IMDUR ) 30 MG 24 hr tablet, TAKE 1 TABLET(30 MG) BY MOUTH DAILY, Disp: 90 tablet, Rfl: 0   metoprolol  tartrate (LOPRESSOR ) 25 MG tablet, TAKE 1/2 TABLET(12.5 MG) BY MOUTH TWICE DAILY, Disp: 90 tablet, Rfl: 3   montelukast  (SINGULAIR ) 10 MG tablet, Take 10 mg by mouth at bedtime., Disp: , Rfl:    Multiple Vitamin (MULTIVITAMIN WITH MINERALS) TABS tablet, Take 1 tablet by mouth daily., Disp: , Rfl:    nitroGLYCERIN  (NITROSTAT ) 0.4 MG SL tablet, Place 1 tablet (0.4 mg total) under the tongue every 5 (five) minutes as needed for chest pain (up to 3 doses. If taking 3rd dose call 911)., Disp: 25 tablet, Rfl: 3   pantoprazole  (PROTONIX ) 40 MG tablet, TAKE 2 TABLETS(80 MG) BY MOUTH DAILY, Disp: 180 tablet, Rfl: 3   potassium chloride  (KLOR-CON ) 10 MEQ tablet, Take 10 mEq by mouth See admin instructions. Take 1 tablet (10 mEq) by mouth on Monday's and Thursday's., Disp: , Rfl:    PROAIR  HFA  108 (90 BASE) MCG/ACT inhaler, Inhale 1-2 puffs into the lungs every 4 (four) hours as needed for wheezing., Disp: , Rfl:    rosuvastatin  (CRESTOR ) 20 MG tablet, Take 1 tablet (20 mg total) by mouth daily at 6 PM., Disp: 30 tablet, Rfl: 0   budesonide-formoterol  (SYMBICORT) 160-4.5 MCG/ACT inhaler, Inhale 2 puffs into the lungs 2 (two) times daily. (Patient not taking: Reported on 08/26/2024), Disp: , Rfl:    cefdinir  (OMNICEF ) 300 MG capsule, Take 1 capsule (300 mg total) by mouth 2 (two) times daily. (Patient not taking: Reported on 08/26/2024), Disp: 14 capsule, Rfl: 0   dextromethorphan -guaiFENesin  (MUCINEX  DM) 30-600 MG 12hr tablet, Take 1 tablet  by mouth 2 (two) times daily. (Patient not taking: Reported on 08/26/2024), Disp: 30 tablet, Rfl: 0   diltiazem  (CARDIZEM  CD) 120 MG 24 hr capsule, Take 120 mg by mouth daily. (Patient not taking: Reported on 08/26/2024), Disp: , Rfl:    estradiol  (ESTRACE ) 0.5 MG tablet, Take 0.5 mg by mouth daily. (Patient not taking: Reported on 08/26/2024), Disp: , Rfl:   Past Medical History: Past Medical History:  Diagnosis Date   Anemia    Anginal pain    Anxiety    Arthritis    feet, knees, back (05/02/2016)   Asthma    CAD (coronary artery disease)    a. 04/2016: NSTEMI 95% stenosis 1st Mrg (s/p DES)   Chest pain    a. 10/2013 Exercise Myoview : Ef 65%, no ischemia.   CHF (congestive heart failure) (HCC)    Chronic bronchitis (HCC)    Chronic lower back pain    COPD (chronic obstructive pulmonary disease) (HCC)    GERD (gastroesophageal reflux disease)    History of blood transfusion 1960s   related to my hysterectomy   Hypercholesterolemia    Hypertension    Lower extremity edema    Mild aortic stenosis 03/07/2023   TTE 02/28/2023: EF 55-60, no RWMA, mild asymmetric LVH, NL RVSF, mild MR, mild aortic stenosis (bulky subvalvular calcification), mean AV gradient 9.6, V-max 199 cm/s, RAP 3   Mitral valve prolapse    NSTEMI (non-ST elevated myocardial infarction) (HCC) 05/01/2016   Pneumonia 05/01/2016   saw trace of slight pneumonia/CT scan (05/02/2016)   Seasonal allergies    Sjogren's syndrome    Wheezing     Tobacco Use: Social History   Tobacco Use  Smoking Status Never   Passive exposure: Past  Smokeless Tobacco Never    Labs: Review Flowsheet  More data exists      Latest Ref Rng & Units 05/02/2016 05/03/2016 08/09/2016 02/16/2021 02/17/2021  Labs for ITP Cardiac and Pulmonary Rehab  Cholestrol 0 - 200 mg/dL 813  813  867  - 868   LDL (calc) 0 - 99 mg/dL 84  80  50  - 59   HDL-C >40 mg/dL 79  77  42  - 46   Trlycerides <150 mg/dL 883  855  798  - 867   Hemoglobin A1c 4.8  - 5.6 % - - - 6.1  -  PH, Arterial 7.350 - 7.450 7.414  - - - -  PCO2 arterial 35.0 - 45.0 mmHg 35.6  - - - -  Bicarbonate 20.0 - 24.0 mEq/L 22.8  24.7  - - - -  TCO2 0 - 100 mmol/L 24  26  - - - -  Acid-base deficit 0.0 - 2.0 mmol/L 1.0  - - - -  O2 Saturation % 92.0  66.0  - - - -  Details       Multiple values from one day are sorted in reverse-chronological order         Capillary Blood Glucose: Lab Results  Component Value Date   GLUCAP 93 02/17/2021   GLUCAP 133 (H) 05/04/2016   GLUCAP 146 (H) 05/04/2016   GLUCAP 148 (H) 05/04/2016   GLUCAP 147 (H) 05/03/2016     Pulmonary Assessment Scores:  Pulmonary Assessment Scores     Row Name 08/26/24 1336         ADL UCSD   ADL Phase Entry     SOB Score total 44       CAT Score   CAT Score 20       mMRC Score   mMRC Score 4       UCSD: Self-administered rating of dyspnea associated with activities of daily living (ADLs) 6-point scale (0 = not at all to 5 = maximal or unable to do because of breathlessness)  Scoring Scores range from 0 to 120.  Minimally important difference is 5 units  CAT: CAT can identify the health impairment of COPD patients and is better correlated with disease progression.  CAT has a scoring range of zero to 40. The CAT score is classified into four groups of low (less than 10), medium (10 - 20), high (21-30) and very high (31-40) based on the impact level of disease on health status. A CAT score over 10 suggests significant symptoms.  A worsening CAT score could be explained by an exacerbation, poor medication adherence, poor inhaler technique, or progression of COPD or comorbid conditions.  CAT MCID is 2 points  mMRC: mMRC (Modified Medical Research Council) Dyspnea Scale is used to assess the degree of baseline functional disability in patients of respiratory disease due to dyspnea. No minimal important difference is established. A decrease in score of 1 point or greater is  considered a positive change.   Pulmonary Function Assessment:  Pulmonary Function Assessment - 08/26/24 1410       Breath   Bilateral Breath Sounds Decreased    Shortness of Breath Yes;Limiting activity          Exercise Target Goals: Exercise Program Goal: Individual exercise prescription set using results from initial 6 min walk test and THRR while considering  patient's activity barriers and safety.   Exercise Prescription Goal: Initial exercise prescription builds to 30-45 minutes a day of aerobic activity, 2-3 days per week.  Home exercise guidelines will be given to patient during program as part of exercise prescription that the participant will acknowledge.  Activity Barriers & Risk Stratification:  Activity Barriers & Cardiac Risk Stratification - 08/26/24 1400       Activity Barriers & Cardiac Risk Stratification   Activity Barriers Back Problems;Muscular Weakness;Shortness of Breath;Deconditioning;Left Hip Replacement;History of Falls;Balance Concerns    Cardiac Risk Stratification Moderate          6 Minute Walk:  6 Minute Walk     Row Name 08/26/24 1519         6 Minute Walk   Phase Initial     Distance 240 feet     Walk Time 6 minutes     # of Rest Breaks 2  1:40-2:50; 3:44-6:00     MPH 0.45     METS 0.17     RPE 13     Perceived Dyspnea  1     VO2 Peak 0.58     Symptoms Yes (comment)  Comments dizzy during walk test     Resting HR 76 bpm     Resting BP 122/64     Resting Oxygen  Saturation  99 %     Exercise Oxygen  Saturation  during 6 min walk 86 %     Max Ex. HR 97 bpm     Max Ex. BP 138/66     2 Minute Post BP 134/64       Interval HR   1 Minute HR 91     2 Minute HR 97     3 Minute HR 94     4 Minute HR 93     5 Minute HR 87     6 Minute HR 74     2 Minute Post HR 73     Interval Heart Rate? Yes       Interval Oxygen    Interval Oxygen ? Yes     Baseline Oxygen  Saturation % 99 %     1 Minute Oxygen  Saturation % 97 %     1  Minute Liters of Oxygen  2 L     2 Minute Oxygen  Saturation % 86 %     2 Minute Liters of Oxygen  2 L  increased to 3L     3 Minute Oxygen  Saturation % 93 %     3 Minute Liters of Oxygen  3 L     4 Minute Oxygen  Saturation % 93 %     4 Minute Liters of Oxygen  3 L     5 Minute Oxygen  Saturation % 92 %     5 Minute Liters of Oxygen  3 L     6 Minute Oxygen  Saturation % 97 %     6 Minute Liters of Oxygen  3 L     2 Minute Post Oxygen  Saturation % 99 %     2 Minute Post Liters of Oxygen  3 L        Oxygen  Initial Assessment:  Oxygen  Initial Assessment - 08/26/24 1401       Home Oxygen    Home Oxygen  Device Home Concentrator;Portable Concentrator;E-Tanks    Sleep Oxygen  Prescription Continuous    Liters per minute 2    Home Exercise Oxygen  Prescription Continuous    Liters per minute 4    Home Resting Oxygen  Prescription Continuous    Liters per minute 2    Compliance with Home Oxygen  Use Yes      Initial 6 min Walk   Oxygen  Used Continuous    Liters per minute 3      Program Oxygen  Prescription   Program Oxygen  Prescription Continuous    Liters per minute 3      Intervention   Short Term Goals To learn and exhibit compliance with exercise, home and travel O2 prescription;To learn and understand importance of maintaining oxygen  saturations>88%;To learn and demonstrate proper use of respiratory medications;To learn and understand importance of monitoring SPO2 with pulse oximeter and demonstrate accurate use of the pulse oximeter.;To learn and demonstrate proper pursed lip breathing techniques or other breathing techniques.     Long  Term Goals Exhibits compliance with exercise, home  and travel O2 prescription;Maintenance of O2 saturations>88%;Compliance with respiratory medication;Verbalizes importance of monitoring SPO2 with pulse oximeter and return demonstration;Exhibits proper breathing techniques, such as pursed lip breathing or other method taught during program session;Demonstrates  proper use of MDI's          Oxygen  Re-Evaluation:   Oxygen  Discharge (Final Oxygen  Re-Evaluation):   Initial Exercise Prescription:  Initial Exercise Prescription - 08/26/24 1500       Date of Initial Exercise RX and Referring Provider   Date 08/26/24    Referring Provider Ramaswamy    Expected Discharge Date 12/05/24      Oxygen    Oxygen  Continuous    Liters 3    Maintain Oxygen  Saturation 88% or higher      Recumbant Bike   Level 1    Minutes 15    METs 1.5      NuStep   Level 1    SPM 65    Minutes 15    METs 1.8      Prescription Details   Frequency (times per week) 2    Duration Progress to 30 minutes of continuous aerobic without signs/symptoms of physical distress      Intensity   THRR 40-80% of Max Heartrate 56-112    Ratings of Perceived Exertion 11-13    Perceived Dyspnea 0-4      Progression   Progression Continue to progress workloads to maintain intensity without signs/symptoms of physical distress.      Resistance Training   Training Prescription Yes    Weight red bands    Reps 10-15          Perform Capillary Blood Glucose checks as needed.  Exercise Prescription Changes:   Exercise Comments:   Exercise Goals and Review:   Exercise Goals     Row Name 08/26/24 1333             Exercise Goals   Increase Physical Activity Yes       Intervention Provide advice, education, support and counseling about physical activity/exercise needs.;Develop an individualized exercise prescription for aerobic and resistive training based on initial evaluation findings, risk stratification, comorbidities and participant's personal goals.       Expected Outcomes Short Term: Attend rehab on a regular basis to increase amount of physical activity.;Long Term: Exercising regularly at least 3-5 days a week.;Long Term: Add in home exercise to make exercise part of routine and to increase amount of physical activity.       Increase Strength and  Stamina Yes       Intervention Provide advice, education, support and counseling about physical activity/exercise needs.;Develop an individualized exercise prescription for aerobic and resistive training based on initial evaluation findings, risk stratification, comorbidities and participant's personal goals.       Expected Outcomes Short Term: Increase workloads from initial exercise prescription for resistance, speed, and METs.;Short Term: Perform resistance training exercises routinely during rehab and add in resistance training at home;Long Term: Improve cardiorespiratory fitness, muscular endurance and strength as measured by increased METs and functional capacity ( )       Able to understand and use rate of perceived exertion (RPE) scale Yes       Intervention Provide education and explanation on how to use RPE scale       Expected Outcomes Short Term: Able to use RPE daily in rehab to express subjective intensity level;Long Term:  Able to use RPE to guide intensity level when exercising independently       Able to understand and use Dyspnea scale Yes       Intervention Provide education and explanation on how to use Dyspnea scale       Expected Outcomes Short Term: Able to use Dyspnea scale daily in rehab to express subjective sense of shortness of breath during exertion;Long Term: Able to use Dyspnea scale to guide intensity level  when exercising independently       Knowledge and understanding of Target Heart Rate Range (THRR) Yes       Intervention Provide education and explanation of THRR including how the numbers were predicted and where they are located for reference       Expected Outcomes Short Term: Able to state/look up THRR;Long Term: Able to use THRR to govern intensity when exercising independently;Short Term: Able to use daily as guideline for intensity in rehab       Understanding of Exercise Prescription Yes       Intervention Provide education, explanation, and written  materials on patient's individual exercise prescription       Expected Outcomes Short Term: Able to explain program exercise prescription;Long Term: Able to explain home exercise prescription to exercise independently          Exercise Goals Re-Evaluation :   Discharge Exercise Prescription (Final Exercise Prescription Changes):   Nutrition:  Target Goals: Understanding of nutrition guidelines, daily intake of sodium 1500mg , cholesterol 200mg , calories 30% from fat and 7% or less from saturated fats, daily to have 5 or more servings of fruits and vegetables.  Biometrics:  Pre Biometrics - 08/26/24 1525       Pre Biometrics   Grip Strength 6 kg           Nutrition Therapy Plan and Nutrition Goals:   Nutrition Assessments:  MEDIFICTS Score Key: >=70 Need to make dietary changes  40-70 Heart Healthy Diet <= 40 Therapeutic Level Cholesterol Diet   Picture Your Plate Scores: <59 Unhealthy dietary pattern with much room for improvement. 41-50 Dietary pattern unlikely to meet recommendations for good health and room for improvement. 51-60 More healthful dietary pattern, with some room for improvement.  >60 Healthy dietary pattern, although there may be some specific behaviors that could be improved.    Nutrition Goals Re-Evaluation:   Nutrition Goals Discharge (Final Nutrition Goals Re-Evaluation):   Psychosocial: Target Goals: Acknowledge presence or absence of significant depression and/or stress, maximize coping skills, provide positive support system. Participant is able to verbalize types and ability to use techniques and skills needed for reducing stress and depression.  Initial Review & Psychosocial Screening:  Initial Psych Review & Screening - 08/26/24 1400       Initial Review   Current issues with History of Depression;Current Psychotropic Meds      Family Dynamics   Good Support System? Yes    Comments support from husband      Barriers    Psychosocial barriers to participate in program There are no identifiable barriers or psychosocial needs.      Screening Interventions   Interventions Encouraged to exercise    Expected Outcomes Short Term goal: Identification and review with participant of any Quality of Life or Depression concerns found by scoring the questionnaire.;Long Term goal: The participant improves quality of Life and PHQ9 Scores as seen by post scores and/or verbalization of changes          Quality of Life Scores:  Scores of 19 and below usually indicate a poorer quality of life in these areas.  A difference of  2-3 points is a clinically meaningful difference.  A difference of 2-3 points in the total score of the Quality of Life Index has been associated with significant improvement in overall quality of life, self-image, physical symptoms, and general health in studies assessing change in quality of life.  PHQ-9: Review Flowsheet  More data exists  08/26/2024 09/26/2016 06/06/2016 04/02/2016 10/17/2015  Depression screen PHQ 2/9  Decreased Interest 0 0 0 0 0  Down, Depressed, Hopeless 0 0 0 0 0  PHQ - 2 Score 0 0 0 0 0  Altered sleeping 2 - - - -  Tired, decreased energy 3 - - - -  Change in appetite 1 - - - -  Feeling bad or failure about yourself  2 - - - -  Trouble concentrating 0 - - - -  Moving slowly or fidgety/restless 0 - - - -  Suicidal thoughts 0 - - - -  PHQ-9 Score 8 - - - -  Difficult doing work/chores Somewhat difficult - - - -   Interpretation of Total Score  Total Score Depression Severity:  1-4 = Minimal depression, 5-9 = Mild depression, 10-14 = Moderate depression, 15-19 = Moderately severe depression, 20-27 = Severe depression   Psychosocial Evaluation and Intervention:  Psychosocial Evaluation - 08/26/24 1513       Psychosocial Evaluation & Interventions   Interventions Encouraged to exercise with the program and follow exercise prescription    Comments Donna Williamson states she  has been dealing with depression in the last year due to her declining health and the passing of her mother. She has reached out to her primary doctor and he increased her psychotropic meds. She feels the increase in her meds has helped with her mental health. She denies needing a referral to a mental health specialist at this time.    Expected Outcomes For Donna Williamson to have less depression while participating in PR    Continue Psychosocial Services  Follow up required by staff          Psychosocial Re-Evaluation:  Psychosocial Re-Evaluation     Row Name 08/27/24 [phone]             Psychosocial Re-Evaluation   Current issues with History of Depression;Current Psychotropic Meds       Comments Donna Williamson is scheduled to start PR on 09/05/24. No new barriers or concerns since orientation.       Expected Outcomes For Donna Williamson to have less depression while participating in PR       Interventions Encouraged to attend Pulmonary Rehabilitation for the exercise       Continue Psychosocial Services  Follow up required by staff          Psychosocial Discharge (Final Psychosocial Re-Evaluation):  Psychosocial Re-Evaluation - 08/27/24 0934       Psychosocial Re-Evaluation   Current issues with History of Depression;Current Psychotropic Meds    Comments Donna Williamson is scheduled to start PR on 09/05/24. No new barriers or concerns since orientation.    Expected Outcomes For Donna Williamson to have less depression while participating in PR    Interventions Encouraged to attend Pulmonary Rehabilitation for the exercise    Continue Psychosocial Services  Follow up required by staff          Education: Education Goals: Education classes will be provided on a weekly basis, covering required topics. Participant will state understanding/return demonstration of topics presented.  Learning Barriers/Preferences:  Learning Barriers/Preferences - 08/26/24 1400       Learning Barriers/Preferences   Learning Barriers Sight     Learning Preferences None          Education Topics: Know Your Numbers Group instruction that is supported by a PowerPoint presentation. Instructor discusses importance of knowing and understanding resting, exercise, and post-exercise oxygen  saturation, heart rate, and blood pressure. Oxygen  saturation, heart  rate, blood pressure, rating of perceived exertion, and dyspnea are reviewed along with a normal range for these values.    Exercise for the Pulmonary Patient Group instruction that is supported by a PowerPoint presentation. Instructor discusses benefits of exercise, core components of exercise, frequency, duration, and intensity of an exercise routine, importance of utilizing pulse oximetry during exercise, safety while exercising, and options of places to exercise outside of rehab.    MET Level  Group instruction provided by PowerPoint, verbal discussion, and written material to support subject matter. Instructor reviews what METs are and how to increase METs.    Pulmonary Medications Verbally interactive group education provided by instructor with focus on inhaled medications and proper administration.   Anatomy and Physiology of the Respiratory System Group instruction provided by PowerPoint, verbal discussion, and written material to support subject matter. Instructor reviews respiratory cycle and anatomical components of the respiratory system and their functions. Instructor also reviews differences in obstructive and restrictive respiratory diseases with examples of each.    Oxygen  Safety Group instruction provided by PowerPoint, verbal discussion, and written material to support subject matter. There is an overview of "What is Oxygen " and "Why do we need it".  Instructor also reviews how to create a safe environment for oxygen  use, the importance of using oxygen  as prescribed, and the risks of noncompliance. There is a brief discussion on traveling with oxygen  and resources  the patient may utilize.   Oxygen  Use Group instruction provided by PowerPoint, verbal discussion, and written material to discuss how supplemental oxygen  is prescribed and different types of oxygen  supply systems. Resources for more information are provided.    Breathing Techniques Group instruction that is supported by demonstration and informational handouts. Instructor discusses the benefits of pursed lip and diaphragmatic breathing and detailed demonstration on how to perform both.     Risk Factor Reduction Group instruction that is supported by a PowerPoint presentation. Instructor discusses the definition of a risk factor, different risk factors for pulmonary disease, and how the heart and lungs work together.   Pulmonary Diseases Group instruction provided by PowerPoint, verbal discussion, and written material to support subject matter. Instructor gives an overview of the different type of pulmonary diseases. There is also a discussion on risk factors and symptoms as well as ways to manage the diseases.   Stress and Energy Conservation Group instruction provided by PowerPoint, verbal discussion, and written material to support subject matter. Instructor gives an overview of stress and the impact it can have on the body. Instructor also reviews ways to reduce stress. There is also a discussion on energy conservation and ways to conserve energy throughout the day.   Warning Signs and Symptoms Group instruction provided by PowerPoint, verbal discussion, and written material to support subject matter. Instructor reviews warning signs and symptoms of stroke, heart attack, cold and flu. Instructor also reviews ways to prevent the spread of infection.   Other Education Group or individual verbal, written, or video instructions that support the educational goals of the pulmonary rehab program.    Knowledge Questionnaire Score:  Knowledge Questionnaire Score - 08/26/24 1333        Knowledge Questionnaire Score   Pre Score 17/18          Core Components/Risk Factors/Patient Goals at Admission:  Personal Goals and Risk Factors at Admission - 08/26/24 1400       Core Components/Risk Factors/Patient Goals on Admission   Improve shortness of breath with ADL's Yes  Intervention Provide education, individualized exercise plan and daily activity instruction to help decrease symptoms of SOB with activities of daily living.    Expected Outcomes Short Term: Improve cardiorespiratory fitness to achieve a reduction of symptoms when performing ADLs;Long Term: Be able to perform more ADLs without symptoms or delay the onset of symptoms          Core Components/Risk Factors/Patient Goals Review:   Goals and Risk Factor Review     Row Name 08/27/24 [phone]             Core Components/Risk Factors/Patient Goals Review   Personal Goals Review Improve shortness of breath with ADL's;Develop more efficient breathing techniques such as purse lipped breathing and diaphragmatic breathing and practicing self-pacing with activity.       Review Donna Williamson is scheduled to start PR on 09/05/24. Unable to asses her goals at this time.       Expected Outcomes To improve shortness of breath with ADL's and develop more efficient breathing techniques such as purse lipped breathing and diaphragmatic breathing; and practicing self-pacing with activity.          Core Components/Risk Factors/Patient Goals at Discharge (Final Review):   Goals and Risk Factor Review - 08/27/24 0937       Core Components/Risk Factors/Patient Goals Review   Personal Goals Review Improve shortness of breath with ADL's;Develop more efficient breathing techniques such as purse lipped breathing and diaphragmatic breathing and practicing self-pacing with activity.    Review Donna Williamson is scheduled to start PR on 09/05/24. Unable to asses her goals at this time.    Expected Outcomes To improve shortness of breath with ADL's and  develop more efficient breathing techniques such as purse lipped breathing and diaphragmatic breathing; and practicing self-pacing with activity.          ITP Comments:   Comments: Pt is making expected progress toward Pulmonary Rehab goals after completing 0 session(s). Recommend continued exercise, life style modification, education, and utilization of breathing techniques to increase stamina and strength, while also decreasing shortness of breath with exertion.  Dr. Slater Staff is Medical Director for Pulmonary Rehab at [facility].

## 2024-09-02 ENCOUNTER — Telehealth: Payer: Self-pay | Admitting: Cardiovascular Disease

## 2024-09-02 ENCOUNTER — Encounter: Payer: Self-pay | Admitting: Radiology

## 2024-09-02 NOTE — Telephone Encounter (Signed)
 Patient wants to come in to have a monitor put on. Please advise

## 2024-09-02 NOTE — Telephone Encounter (Signed)
 Scheduled to come in Tuesday , 09/03/24, at 11:00 AM to have 3 day ZIO XT applied.

## 2024-09-03 ENCOUNTER — Ambulatory Visit: Attending: Cardiology

## 2024-09-05 ENCOUNTER — Encounter (HOSPITAL_COMMUNITY): Admission: RE | Admit: 2024-09-05 | Source: Ambulatory Visit

## 2024-09-06 ENCOUNTER — Other Ambulatory Visit: Payer: Self-pay

## 2024-09-06 NOTE — Patient Outreach (Signed)
 Complex Care Management   Visit Note  09/06/2024  Name:  Donna Williamson MRN: [id number] DOB: 1946-06-15  Situation: Referral received for Complex Care Management related to Pulmonary disease I obtained verbal consent from Patient.  Visit completed with Patient  on the phone  Background:   Past Medical History:  Diagnosis Date   Anemia    Anginal pain    Anxiety    Arthritis    feet, knees, back (05/02/2016)   Asthma    CAD (coronary artery disease)    a. 04/2016: NSTEMI 95% stenosis 1st Mrg (s/p DES)   Chest pain    a. 10/2013 Exercise Myoview : Ef 65%, no ischemia.   CHF (congestive heart failure) (HCC)    Chronic bronchitis (HCC)    Chronic lower back pain    COPD (chronic obstructive pulmonary disease) (HCC)    GERD (gastroesophageal reflux disease)    History of blood transfusion 1960s   related to my hysterectomy   Hypercholesterolemia    Hypertension    Lower extremity edema    Mild aortic stenosis 03/07/2023   TTE 02/28/2023: EF 55-60, no RWMA, mild asymmetric LVH, NL RVSF, mild MR, mild aortic stenosis (bulky subvalvular calcification), mean AV gradient 9.6, V-max 199 cm/s, RAP 3   Mitral valve prolapse    NSTEMI (non-ST elevated myocardial infarction) (HCC) 05/01/2016   Pneumonia 05/01/2016   saw trace of slight pneumonia/CT scan (05/02/2016)   Seasonal allergies    Sjogren's syndrome    Wheezing     Assessment: Patient Reported Symptoms:  Cognitive Cognitive Status: No symptoms reported      Neurological Neurological Review of Symptoms: No symptoms reported    HEENT HEENT Symptoms Reported: No symptoms reported      Cardiovascular Cardiovascular Symptoms Reported: Palpitations Does patient have uncontrolled Hypertension?: No    Respiratory Respiratory Symptoms Reported: Productive cough Other Respiratory Symptoms: patient reports chronic productive cough since she had pneumonia in April (clear phlegm), patient reports sitting sat 93-94% on RA. with  activity oxygen  sat drops to below 86% and then patient report increased oxygen  to 4l/[place]. patient has an upcoming appointment with pulmonology on 09/18/24. reports sleep study completed and was diagnosed with OSA will discuss with pulmonology at next visit.    Endocrine Endocrine Symptoms Reported: No symptoms reported Is patient diabetic?: No    Gastrointestinal Gastrointestinal Symptoms Reported: No symptoms reported      Genitourinary Genitourinary Symptoms Reported: No symptoms reported    Integumentary Integumentary Symptoms Reported: No symptoms reported    Musculoskeletal Musculoskelatal Symptoms Reviewed: Limited mobility Additional Musculoskeletal Details: due to arthritis in both knees(patient reports needs knee replacement) uses cane with ambulation Musculoskeletal Management Strategies: Medical device, Activity, Adequate rest Falls in the past year?:  (denies any falls since last encounter with RNCM)    Psychosocial Psychosocial Symptoms Reported: No symptoms reported          There were no vitals filed for this visit.  Medications Reviewed Today     Reviewed by Sanaa Zilberman M, RN (Registered Nurse) on 09/06/24 at 1421  Med List Status: <None>   Medication Order Taking? Sig Documenting Provider Last Dose Status Informant  acetaminophen  (TYLENOL ) 500 MG tablet [id number] Yes Take 500-1,000 mg by mouth every 6 (six) hours as needed for moderate pain (pain score 4-6). [provider]  Active Self, Pharmacy Records  albuterol  (PROVENTIL ) (2.5 MG/3ML) 0.083% nebulizer solution [id number] Yes Take 3 mLs (2.5 mg total) by nebulization every 2 (two) hours as needed  for wheezing or shortness of breath. Akula, Vijaya, MD  Active Self, Pharmacy Records           Med Note MACK, IZETTA DEL   Fri Jan 10, 2020  3:15 PM)    ALPRAZolam  (XANAX ) 0.5 MG tablet [id number] Yes Take 0.5 mg by mouth daily as needed for anxiety.  [provider]  Active Self, Pharmacy Records            Med Note ODETTA, [facility] A   Mon Aug 14, 2017  9:55 AM)    aspirin  EC 81 MG tablet [id number] Yes Take 81 mg by mouth daily. [provider]  Active Self, Pharmacy Records  benzonatate  (TESSALON ) 200 MG capsule [id number] Yes Take 200 mg by mouth 3 (three) times daily as needed for cough. [provider]  Active Self, Pharmacy Records  budesonide-formoterol  [facility]) 160-4.5 MCG/ACT inhaler [id number]  Inhale 2 puffs into the lungs 2 (two) times daily.  Patient not taking: Reported on 09/06/2024   [provider]  Active Self, Pharmacy Records           Med Note JACKOLYN WADDELL DEL   Thu Feb 08, 2024  9:33 AM) Was taken off, but restarted the day before yesterday.   carboxymethylcellulose (REFRESH PLUS) 0.5 % SOLN [id number] Yes Place 1 drop into both eyes daily as needed (dry eyes). [provider]  Active Self, Pharmacy Records  cefdinir  (OMNICEF ) 300 MG capsule [id number]  Take 1 capsule (300 mg total) by mouth 2 (two) times daily.  Patient not taking: Reported on 09/06/2024   Malachy Comer GAILS, NP  Active   Cholecalciferol  (VITAMIN D3) 2000 UNITS TABS [id number] Yes Take 2,000 Units by mouth daily. [provider]  Active Self, Pharmacy Records  cyanocobalamin  500 MCG tablet [id number] Yes Take 1,000 mcg by mouth daily. [provider]  Active Self, Pharmacy Records  dextromethorphan -guaiFENesin  (MUCINEX  DM) 30-600 MG 12hr tablet [id number]  Take 1 tablet by mouth 2 (two) times daily.  Patient not taking: Reported on 09/06/2024   Gherghe, Costin M, MD  Active   diltiazem  (CARDIZEM  CD) 120 MG 24 hr capsule [id number]  Take 120 mg by mouth daily.  Patient not taking: Reported on 09/06/2024   [provider]  Active Self, Pharmacy Records  EPIPEN  2-PAK 0.3 MG/0.3ML SOAJ injection [id number] Yes Inject 0.3 mg into the skin daily as needed (allergic reaction).  [provider]  Active Self, Pharmacy Records           Med Note  CRAIG, [facility] V   Fri Nov 29, 2013  1:48 PM)     estradiol  (ESTRACE ) 0.5 MG tablet [id number]  Take 0.5 mg by mouth daily.  Patient not taking: Reported on 09/06/2024   [provider]  Active Self, Pharmacy Records  ezetimibe  (ZETIA ) 10 MG tablet [id number] Yes Take 10 mg by mouth at bedtime. [provider]  Active Self, Pharmacy Records  FLUoxetine  (PROZAC ) 20 MG capsule [id number] Yes Take 20 mg by mouth daily. [provider]  Active Self, Pharmacy Records  fluticasone  (FLONASE ) 50 MCG/ACT nasal spray [id number] Yes Place 2 sprays into the nose daily. [provider]  Active Self, Pharmacy Records  furosemide  (LASIX ) 20 MG tablet [id number] Yes Take 1 tablet (20 mg total) by mouth daily. Wonda Sharper, MD  Active Self, Pharmacy Records  gabapentin  (NEURONTIN ) 100 MG capsule [id number] Yes Take 100 mg by mouth daily. [provider]  Active   inclisiran (LEQVIO ) 284 MG/1.5ML SOSY injection  [id number] Yes Inject 284 mg into the skin once. [provider]  Active Self, Pharmacy Records           Med Note JACKOLYN, TAYLOR H   Thu Feb 08, 2024  9:35 AM) [facility] administers every 6 months.   isosorbide  mononitrate (IMDUR ) 30 MG 24 hr tablet [id number] Yes TAKE 1 TABLET(30 MG) BY MOUTH DAILY Wonda Sharper, MD  Active   metoprolol  tartrate (LOPRESSOR ) 25 MG tablet [id number] Yes TAKE 1/2 TABLET(12.5 MG) BY MOUTH TWICE DAILY Wonda Sharper, MD  Active   montelukast  (SINGULAIR ) 10 MG tablet [id number] Yes Take 10 mg by mouth at bedtime. [provider]  Active Self, Pharmacy Records  Multiple Vitamin (MULTIVITAMIN WITH MINERALS) TABS tablet [id number] Yes Take 1 tablet by mouth daily. [provider]  Active Self, Pharmacy Records  nitroGLYCERIN  (NITROSTAT ) 0.4 MG SL tablet [id number] Yes Place 1 tablet (0.4 mg total) under the tongue every 5 (five) minutes as needed for chest pain (up to 3 doses. If taking 3rd dose call 911). Lelon Glendia ONEIDA DEVONNA  Active Self, Pharmacy Records           Med Note STEPHEN, [facility]   Tue Jan 31, 2023  3:17 PM)    pantoprazole  (PROTONIX ) 40 MG tablet [id number] Yes TAKE 2 TABLETS(80 MG) BY MOUTH DAILY Wonda Sharper, MD  Active   potassium chloride  (KLOR-CON ) 10 MEQ tablet [id number] Yes Take 10 mEq by mouth See admin instructions. Take 1 tablet (10 mEq) by mouth on Monday's and Thursday's. [provider]  Active Self, Pharmacy Records  PROAIR  HFA 108 (90 BASE) MCG/ACT inhaler [id number] Yes Inhale 1-2 puffs into the lungs every 4 (four) hours as needed for wheezing. [provider]  Active Self, Pharmacy Records           Med Note CRAIG, [facility] V   Fri Nov 29, 2013  1:48 PM)    rosuvastatin  (CRESTOR ) 20 MG tablet [id number] Yes Take 1 tablet (20 mg total) by mouth daily at 6 PM. Jerrie Anger, PA  Active Self, Pharmacy Records          Recommendation:   PCP Follow-up Specialty provider follow-up 11/19 pulmonology, 09/20/24 cardiology, 10/03/24 Pulmonology and PCP 10/17/24. Continue Current Plan of Care  Follow Up Plan:   Telephone follow up appointment date/time:  10/07/24 at 2:30 pm  Heddy Shutter, RN, MSN, BSN, CCM [place]  [facility], Population Health Case Manager Phone: [phone]

## 2024-09-06 NOTE — Patient Instructions (Signed)
 Visit Information  Thank you for taking time to visit with me today. Please don't hesitate to contact me if I can be of assistance to you before our next scheduled appointment.  Your next care management appointment is by telephone on 10/07/24 at 2:30 pm  Please call the care guide team at [phone] if you need to cancel, schedule, or reschedule an appointment.   Please call the Suicide and Crisis Lifeline: 988 call the [place]  National Suicide Prevention Lifeline: (831)633-8577 or TTY: 9288695796 TTY [phone]) to talk to a trained counselor if you are experiencing a Mental Health or Behavioral Health Crisis or need someone to talk to.  Heddy Shutter, RN, MSN, BSN, CCM [place]  [facility], Population Health Case Manager Phone: [phone]

## 2024-09-09 DIAGNOSIS — I493 Ventricular premature depolarization: Secondary | ICD-10-CM | POA: Diagnosis not present

## 2024-09-10 ENCOUNTER — Encounter (HOSPITAL_COMMUNITY): Admission: RE | Admit: 2024-09-10 | Source: Ambulatory Visit

## 2024-09-11 NOTE — Progress Notes (Signed)
 Received message that Coila has requested to be discharged from Pulmonary Rehab. Will discharge pt.

## 2024-09-11 NOTE — Progress Notes (Signed)
 Discharge Progress Report  Patient Details  Name: Donna Williamson MRN: [id number] Date of Birth: 01-07-1946 Referring Provider:   Conrad Ports Pulmonary Rehab Walk Test from 08/26/2024 in [facility]  Referring Provider Ramaswamy     Number of Visits: 0  Reason for Discharge:  Early Exit:  Personal  Smoking History:  Social History   Tobacco Use  Smoking Status Never   Passive exposure: Past  Smokeless Tobacco Never    Diagnosis:  ILD (interstitial lung disease) (HCC)  ADL UCSD:  Pulmonary Assessment Scores     Row Name 08/26/24 1336         ADL UCSD   ADL Phase Entry     SOB Score total 44       CAT Score   CAT Score 20       mMRC Score   mMRC Score 4        Initial Exercise Prescription:  Initial Exercise Prescription - 08/26/24 1500       Date of Initial Exercise RX and Referring Provider   Date 08/26/24    Referring Provider Ramaswamy    Expected Discharge Date 12/05/24      Oxygen    Oxygen  Continuous    Liters 3    Maintain Oxygen  Saturation 88% or higher      Recumbant Bike   Level 1    Minutes 15    METs 1.5      NuStep   Level 1    SPM 65    Minutes 15    METs 1.8      Prescription Details   Frequency (times per week) 2    Duration Progress to 30 minutes of continuous aerobic without signs/symptoms of physical distress      Intensity   THRR 40-80% of Max Heartrate 56-112    Ratings of Perceived Exertion 11-13    Perceived Dyspnea 0-4      Progression   Progression Continue to progress workloads to maintain intensity without signs/symptoms of physical distress.      Resistance Training   Training Prescription Yes    Weight red bands    Reps 10-15          Discharge Exercise Prescription (Final Exercise Prescription Changes):   Functional Capacity:  6 Minute Walk     Row Name 08/26/24 1519         6 Minute Walk   Phase Initial     Distance 240  feet     Walk Time 6 minutes     # of Rest Breaks 2  1:40-2:50; 3:44-6:00     MPH 0.45     METS 0.17     RPE 13     Perceived Dyspnea  1     VO2 Peak 0.58     Symptoms Yes (comment)     Comments dizzy during walk test     Resting HR 76 bpm     Resting BP 122/64     Resting Oxygen  Saturation  99 %     Exercise Oxygen  Saturation  during 6 min walk 86 %     Max Ex. HR 97 bpm     Max Ex. BP 138/66     2 Minute Post BP 134/64       Interval HR   1 Minute HR 91     2 Minute HR 97     3 Minute HR 94  4 Minute HR 93     5 Minute HR 87     6 Minute HR 74     2 Minute Post HR 73     Interval Heart Rate? Yes       Interval Oxygen    Interval Oxygen ? Yes     Baseline Oxygen  Saturation % 99 %     1 Minute Oxygen  Saturation % 97 %     1 Minute Liters of Oxygen  2 L     2 Minute Oxygen  Saturation % 86 %     2 Minute Liters of Oxygen  2 L  increased to 3L     3 Minute Oxygen  Saturation % 93 %     3 Minute Liters of Oxygen  3 L     4 Minute Oxygen  Saturation % 93 %     4 Minute Liters of Oxygen  3 L     5 Minute Oxygen  Saturation % 92 %     5 Minute Liters of Oxygen  3 L     6 Minute Oxygen  Saturation % 97 %     6 Minute Liters of Oxygen  3 L     2 Minute Post Oxygen  Saturation % 99 %     2 Minute Post Liters of Oxygen  3 L        Psychological, QOL, Others - Outcomes: PHQ 2/9:    08/26/2024    3:12 PM 09/26/2016    5:03 PM 06/06/2016    4:25 PM 04/02/2016   10:39 AM 10/17/2015   11:11 AM  Depression screen PHQ 2/9  Decreased Interest 0 0 0 0 0  Down, Depressed, Hopeless 0 0 0 0 0  PHQ - 2 Score 0 0 0 0 0  Altered sleeping 2      Tired, decreased energy 3      Change in appetite 1      Feeling bad or failure about yourself  2      Trouble concentrating 0      Moving slowly or fidgety/restless 0      Suicidal thoughts 0      PHQ-9 Score 8       Difficult doing work/chores Somewhat difficult         Data saved with a previous flowsheet row definition    Quality of  Life:   Personal Goals: Goals established at orientation with interventions provided to work toward goal.  Personal Goals and Risk Factors at Admission - 08/26/24 1400       Core Components/Risk Factors/Patient Goals on Admission   Improve shortness of breath with ADL's Yes    Intervention Provide education, individualized exercise plan and daily activity instruction to help decrease symptoms of SOB with activities of daily living.    Expected Outcomes Short Term: Improve cardiorespiratory fitness to achieve a reduction of symptoms when performing ADLs;Long Term: Be able to perform more ADLs without symptoms or delay the onset of symptoms           Personal Goals Discharge:  Goals and Risk Factor Review     Row Name 08/27/24 0937             Core Components/Risk Factors/Patient Goals Review   Personal Goals Review Improve shortness of breath with ADL's;Develop more efficient breathing techniques such as purse lipped breathing and diaphragmatic breathing and practicing self-pacing with activity.       Review Donna Williamson is scheduled to start PR on 09/05/24. Unable to asses her goals at this time.  Expected Outcomes To improve shortness of breath with ADL's and develop more efficient breathing techniques such as purse lipped breathing and diaphragmatic breathing; and practicing self-pacing with activity.          Exercise Goals and Review:  Exercise Goals     Row Name 08/26/24 1333             Exercise Goals   Increase Physical Activity Yes       Intervention Provide advice, education, support and counseling about physical activity/exercise needs.;Develop an individualized exercise prescription for aerobic and resistive training based on initial evaluation findings, risk stratification, comorbidities and participant's personal goals.       Expected Outcomes Short Term: Attend rehab on a regular basis to increase amount of physical activity.;Long Term: Exercising regularly at  least 3-5 days a week.;Long Term: Add in home exercise to make exercise part of routine and to increase amount of physical activity.       Increase Strength and Stamina Yes       Intervention Provide advice, education, support and counseling about physical activity/exercise needs.;Develop an individualized exercise prescription for aerobic and resistive training based on initial evaluation findings, risk stratification, comorbidities and participant's personal goals.       Expected Outcomes Short Term: Increase workloads from initial exercise prescription for resistance, speed, and METs.;Short Term: Perform resistance training exercises routinely during rehab and add in resistance training at home;Long Term: Improve cardiorespiratory fitness, muscular endurance and strength as measured by increased METs and functional capacity ( )       Able to understand and use rate of perceived exertion (RPE) scale Yes       Intervention Provide education and explanation on how to use RPE scale       Expected Outcomes Short Term: Able to use RPE daily in rehab to express subjective intensity level;Long Term:  Able to use RPE to guide intensity level when exercising independently       Able to understand and use Dyspnea scale Yes       Intervention Provide education and explanation on how to use Dyspnea scale       Expected Outcomes Short Term: Able to use Dyspnea scale daily in rehab to express subjective sense of shortness of breath during exertion;Long Term: Able to use Dyspnea scale to guide intensity level when exercising independently       Knowledge and understanding of Target Heart Rate Range (THRR) Yes       Intervention Provide education and explanation of THRR including how the numbers were predicted and where they are located for reference       Expected Outcomes Short Term: Able to state/look up THRR;Long Term: Able to use THRR to govern intensity when exercising independently;Short Term: Able to use  daily as guideline for intensity in rehab       Understanding of Exercise Prescription Yes       Intervention Provide education, explanation, and written materials on patient's individual exercise prescription       Expected Outcomes Short Term: Able to explain program exercise prescription;Long Term: Able to explain home exercise prescription to exercise independently          Exercise Goals Re-Evaluation:   Nutrition & Weight - Outcomes:  Pre Biometrics - 08/26/24 1525       Pre Biometrics   Grip Strength 6 kg           Nutrition:   Nutrition Discharge:   Education Questionnaire Score:  Knowledge  Questionnaire Score - 08/26/24 1333       Knowledge Questionnaire Score   Pre Score 17/18          Goals reviewed with patient; copy given to patient.

## 2024-09-12 ENCOUNTER — Encounter (HOSPITAL_COMMUNITY)

## 2024-09-12 ENCOUNTER — Ambulatory Visit

## 2024-09-12 ENCOUNTER — Ambulatory Visit: Admitting: Nurse Practitioner

## 2024-09-12 DIAGNOSIS — Z862 Personal history of diseases of the blood and blood-forming organs and certain disorders involving the immune mechanism: Secondary | ICD-10-CM

## 2024-09-12 DIAGNOSIS — R0609 Other forms of dyspnea: Secondary | ICD-10-CM

## 2024-09-12 DIAGNOSIS — J8489 Other specified interstitial pulmonary diseases: Secondary | ICD-10-CM

## 2024-09-12 DIAGNOSIS — N179 Acute kidney failure, unspecified: Secondary | ICD-10-CM

## 2024-09-12 DIAGNOSIS — M359 Systemic involvement of connective tissue, unspecified: Secondary | ICD-10-CM | POA: Diagnosis not present

## 2024-09-12 DIAGNOSIS — R0902 Hypoxemia: Secondary | ICD-10-CM

## 2024-09-12 LAB — PULMONARY FUNCTION TEST
DL/VA % pred: 64 %
DL/VA: 2.61 ml/min/mmHg/L
DLCO unc % pred: 46 %
DLCO unc: 9.1 ml/min/mmHg
FEF 25-75 Pre: 1.2 L/s
FEF2575-%Pred-Pre: 80 %
FEV1-%Pred-Pre: 79 %
FEV1-Pre: 1.66 L
FEV1FVC-%Pred-Pre: 101 %
FEV6-%Pred-Pre: 83 %
FEV6-Pre: 2.2 L
FEV6FVC-%Pred-Pre: 105 %
FVC-%Pred-Pre: 79 %
FVC-Pre: 2.21 L
Pre FEV1/FVC ratio: 75 %
Pre FEV6/FVC Ratio: 100 %

## 2024-09-12 NOTE — Progress Notes (Signed)
Spiro/DLCO performed today. 

## 2024-09-12 NOTE — Patient Instructions (Signed)
Spiro/DLCO performed today. 

## 2024-09-13 DIAGNOSIS — J189 Pneumonia, unspecified organism: Secondary | ICD-10-CM | POA: Diagnosis not present

## 2024-09-17 ENCOUNTER — Encounter (HOSPITAL_COMMUNITY)

## 2024-09-17 NOTE — Progress Notes (Unsigned)
 @Patient  ID: Donna Williamson, female    DOB: 05-21-1946, 78 y.o.   MRN: [id number]  Chief Complaint  Patient presents with   Medical Management of Chronic Issues    Pt states no longer taking cellcept  due to side effects / is taking gabapentin  100mg      Referring provider: Shayne Anes, MD  HPI: 78 year old female, never smoker followed for CT-ILD and chronic respiratory failure on supplemental O2. She is a patient of Dr. Reeves and last seen in office 05/22/2024 by [facility] NP. Past medical history significant for NSTEMI, HTN, mild AS, CAD, HF, GERD, CKD, Sjogren's syndrome, HLD, depression.   TEST/EVENTS:  03/15/2024 PFT: FVC 87, FEV1 91, ratio 78, DLCO 63 04/03/2024 HRCT chest: atherosclerosis/calcifications. Scattered nodes, likely reactive and not pathologically enlarged. Esophagus decompressed. Coarse interstitial infiltrates in the lungs. Ground glass previously seen improved. Emphysematous changes. Subpleural fibrosis.  06/17/2024 echo: EF 55-60%. RV function normal. Trivial MR. Mild AS.   05/22/2024: OV with Dr. Geronimo. 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 Since a flareup in spring 2025; not off prednisone . BP is fine. Prior hypotension related to adrenal insufficiency and medications. Dr. Jeannetta felt based on CT chest results, CellCept  would be indicated. PFT stable. Reviewed immunosuppression therapy. Plan to start. Move up echo given concern for PH. If doing well and stable, can consider antifibrotic.   07/15/2024: Today - follow up Discussed the use of AI scribe software for clinical note transcription with the patient, who gave verbal consent to proceed.  History of Present Illness Donna Williamson is an 78 year old female with interstitial lung disease who presents with side effects from Cellcept  and sinus symptoms.  She has been experiencing side effects from Cellcept  (mycophenolate ) used for her connective tissue disease. She noted swelling in her feet. She also experienced  heart palpitations described as 'flutters' and significant fatigue, which improved after discontinuing Cellcept . She was also having headaches. No recurrent symptoms since coming off the CellCept .   For the past one and a half to two weeks, she has had sinus symptoms, including phlegm in her throat, which she attributes to her sinuses. She reports tenderness under her eyes and has been using Flonase  and saline spray for relief. She has not had a fever but feels warm at times despite having occasional chills. She coughs up phlegm which is sometimes clear and sometimes colored. No hemoptysis, headaches, ear pain, sore throat. No recent sick exposures. No viral testing completed.   Her past medical history includes pneumonia in April, for which she was hospitalized. She is currently on oxygen  therapy and has been using Singulair . She was previously on Symbicort and albuterol  but has not used them recently. She also takes Protonix  for reflux.  She is concerned about her connective tissue disease affecting her lungs and is interested in pulmonary rehabilitation.   No increased O2 requirement. Using 3 lpm with exertion. Able to maintain saturations on room air at rest.     OV 09/17/2024  Subjective:  Patient ID: Donna Williamson, female , DOB: 04-24-1946 , age 68 y.o. , MRN: [id number] , ADDRESS: [address] [place] [address] PCP Shayne Anes, MD Patient Care Team: Shayne Anes, MD as PCP - General (Internal Medicine) Wonda Sharper, MD as PCP - Cardiology (Cardiology) Prentiss Heddy CHRISTELLA, RN as [facility] Care Management  This Provider for this visit: Treatment Team:  Attending Provider: Geronimo Amel, MD    09/17/2024 -  No chief complaint on  file.    HPI Donna Williamson 78 y.o. -    CT Chest data from date: ****  - personally visualized and independently interpreted : *** - my findings are: ***   PFT     Latest Ref Rng & Units 09/12/2024    2:43 PM 03/15/2024    10:17 AM 09/27/2023   12:50 PM  PFT Results  FVC-Pre L 2.21  P 2.45  2.46   FVC-Predicted Pre % 79  P 87  86   FVC-Post L   2.50   FVC-Predicted Post %   88   Pre FEV1/FVC % % 75  P 78  78   Post FEV1/FCV % %   79   FEV1-Pre L 1.66  P 1.90  1.92   FEV1-Predicted Pre % 79  P 91  90   FEV1-Post L   1.98   DLCO uncorrected ml/min/mmHg 9.10  P 10.97  11.70   DLCO UNC% % 46  P 55  59   DLCO corrected ml/min/mmHg  12.45  11.70   DLCO COR %Predicted %  63  59   DLVA Predicted % 64  P 79  74   TLC L   4.82   TLC % Predicted %   91   RV % Predicted %   98     P Preliminary result       LAB RESULTS last 96 hours No results found.       has a past medical history of Anemia, Anginal pain, Anxiety, Arthritis, Asthma, CAD (coronary artery disease), Chest pain, CHF (congestive heart failure) (HCC), Chronic bronchitis (HCC), Chronic lower back pain, COPD (chronic obstructive pulmonary disease) (HCC), GERD (gastroesophageal reflux disease), History of blood transfusion (1960s), Hypercholesterolemia, Hypertension, Lower extremity edema, Mild aortic stenosis (03/07/2023), Mitral valve prolapse, NSTEMI (non-ST elevated myocardial infarction) (HCC) (05/01/2016), Pneumonia (05/01/2016), Seasonal allergies, Sjogren's syndrome, and Wheezing.   reports that she has never smoked. She has been exposed to tobacco smoke. She has never used smokeless tobacco.  Past Surgical History:  Procedure Laterality Date   ABDOMINAL HYSTERECTOMY  1960s   partial   CARDIAC CATHETERIZATION N/A 05/02/2016   Procedure: Right/Left Heart Cath and Coronary Angiography;  Surgeon: Candyce GORMAN Reek, MD;  Location: [facility] INVASIVE CV LAB;  Service: Cardiovascular;  Laterality: N/A;   CARDIAC CATHETERIZATION N/A 05/02/2016   Procedure: Coronary Stent Intervention;  Surgeon: Candyce GORMAN Reek, MD;  Location: [facility] INVASIVE CV LAB;  Service: Cardiovascular;  Laterality: N/A;   CATARACT EXTRACTION W/PHACO Right 09/20/2019    Procedure: CATARACT EXTRACTION PHACO AND INTRAOCULAR LENS PLACEMENT (IOC) RIGHT;  Surgeon: Jaye Fallow, MD;  Location: [facility] ORS;  Service: Ophthalmology;  Laterality: Right;  US  01:29.4 CDE 19.28 FLUID PACK LOT # [id number]   CATARACT EXTRACTION W/PHACO Left 10/15/2019   Procedure: CATARACT EXTRACTION PHACO AND INTRAOCULAR LENS PLACEMENT (IOC) LEFT 8.76, 00:48.8;  Surgeon: Jaye Fallow, MD;  Location: [facility];  Service: Ophthalmology;  Laterality: Left;   COLONOSCOPY W/ BIOPSIES AND POLYPECTOMY     CORONARY ANGIOPLASTY WITH STENT PLACEMENT  05/02/2016   1 stent   CORONARY PRESSURE/FFR STUDY N/A 02/17/2021   Procedure: INTRAVASCULAR PRESSURE WIRE/FFR STUDY;  Surgeon: Verlin Lonni BIRCH, MD;  Location: [facility] INVASIVE CV LAB;  Service: Cardiovascular;  Laterality: N/A;   CORONARY STENT INTERVENTION N/A 02/17/2021   Procedure: CORONARY STENT INTERVENTION;  Surgeon: Verlin Lonni BIRCH, MD;  Location: [facility] INVASIVE CV LAB;  Service: Cardiovascular;  Laterality: N/A;   ESOPHAGOGASTRODUODENOSCOPY  JOINT REPLACEMENT Left 2007   hip   LAPAROSCOPIC SALPINGOOPHERECTOMY Bilateral ~ 1990   LEFT HEART CATH AND CORONARY ANGIOGRAPHY N/A 02/17/2021   Procedure: LEFT HEART CATH AND CORONARY ANGIOGRAPHY;  Surgeon: Verlin Lonni BIRCH, MD;  Location: [facility] INVASIVE CV LAB;  Service: Cardiovascular;  Laterality: N/A;   TOTAL HIP ARTHROPLASTY Left 2007    Allergies  Allergen Reactions   Levofloxacin Anaphylaxis   Plavix  [Clopidogrel  Bisulfate] Rash   Azathioprine  Diarrhea and Other (See Comments)    Feeling weak as a kitten   Ciprofibrate     Other reaction(s): swelling, muscle aches   Codeine Nausea Only   Drug Ingredient [Ticagrelor ] Other (See Comments)    Patient reports hemorrhage after medical trial of brilinta  in 2017   Nitrofurantoin  Other (See Comments)    VISION CHANGES   Ciprofloxacin Rash    Other Reaction(s): nausea and vomiting   Penicillins Rash   Sulfa  Antibiotics Rash    Immunization History  Administered Date(s) Administered   Influenza-Unspecified 07/01/2022, 07/02/2023   Pneumococcal-Unspecified 07/01/2014, 04/30/2022   Respiratory Syncytial Virus Vaccine,Recomb Aduvanted(Arexvy) 08/31/2022   Td 09/10/2019   Tdap 08/23/2015   Unspecified SARS-COV-2 Vaccination 11/25/2019, 12/16/2019, 08/01/2020, 07/16/2021, 04/07/2022, 07/31/2022   Zoster Recombinant(Shingrix) 01/31/2018, 05/07/2018    Family History  Problem Relation Age of Onset   Hypertension Mother    Stroke Mother    Breast cancer Mother    Heart disease Father    Emphysema Father        smoked   Asthma Father    Ovarian cancer Sister      Current Outpatient Medications:    acetaminophen  (TYLENOL ) 500 MG tablet, Take 500-1,000 mg by mouth every 6 (six) hours as needed for moderate pain (pain score 4-6)., Disp: , Rfl:    albuterol  (PROVENTIL ) (2.5 MG/3ML) 0.083% nebulizer solution, Take 3 mLs (2.5 mg total) by nebulization every 2 (two) hours as needed for wheezing or shortness of breath., Disp: 75 mL, Rfl: 2   ALPRAZolam  (XANAX ) 0.5 MG tablet, Take 0.5 mg by mouth daily as needed for anxiety. , Disp: , Rfl:    aspirin  EC 81 MG tablet, Take 81 mg by mouth daily., Disp: , Rfl:    benzonatate  (TESSALON ) 200 MG capsule, Take 200 mg by mouth 3 (three) times daily as needed for cough., Disp: , Rfl:    budesonide-formoterol  (SYMBICORT) 160-4.5 MCG/ACT inhaler, Inhale 2 puffs into the lungs 2 (two) times daily. (Patient not taking: Reported on 09/06/2024), Disp: , Rfl:    carboxymethylcellulose (REFRESH PLUS) 0.5 % SOLN, Place 1 drop into both eyes daily as needed (dry eyes)., Disp: , Rfl:    cefdinir  (OMNICEF ) 300 MG capsule, Take 1 capsule (300 mg total) by mouth 2 (two) times daily. (Patient not taking: Reported on 09/06/2024), Disp: 14 capsule, Rfl: 0   Cholecalciferol  (VITAMIN D3) 2000 UNITS TABS, Take 2,000 Units by mouth daily., Disp: , Rfl:    cyanocobalamin  500 MCG  tablet, Take 1,000 mcg by mouth daily., Disp: , Rfl:    dextromethorphan -guaiFENesin  (MUCINEX  DM) 30-600 MG 12hr tablet, Take 1 tablet by mouth 2 (two) times daily. (Patient not taking: Reported on 09/06/2024), Disp: 30 tablet, Rfl: 0   diltiazem  (CARDIZEM  CD) 120 MG 24 hr capsule, Take 120 mg by mouth daily. (Patient not taking: Reported on 09/06/2024), Disp: , Rfl:    EPIPEN  2-PAK 0.3 MG/0.3ML SOAJ injection, Inject 0.3 mg into the skin daily as needed (allergic reaction). , Disp: , Rfl:    estradiol  (  ESTRACE ) 0.5 MG tablet, Take 0.5 mg by mouth daily. (Patient not taking: Reported on 09/06/2024), Disp: , Rfl:    ezetimibe  (ZETIA ) 10 MG tablet, Take 10 mg by mouth at bedtime., Disp: , Rfl:    FLUoxetine  (PROZAC ) 20 MG capsule, Take 20 mg by mouth daily., Disp: , Rfl:    fluticasone  (FLONASE ) 50 MCG/ACT nasal spray, Place 2 sprays into the nose daily., Disp: , Rfl:    furosemide  (LASIX ) 20 MG tablet, Take 1 tablet (20 mg total) by mouth daily., Disp: 90 tablet, Rfl: 3   gabapentin  (NEURONTIN ) 100 MG capsule, Take 100 mg by mouth daily., Disp: , Rfl:    inclisiran (LEQVIO ) 284 MG/1.5ML SOSY injection, Inject 284 mg into the skin once., Disp: , Rfl:    isosorbide  mononitrate (IMDUR ) 30 MG 24 hr tablet, TAKE 1 TABLET(30 MG) BY MOUTH DAILY, Disp: 90 tablet, Rfl: 0   metoprolol  tartrate (LOPRESSOR ) 25 MG tablet, TAKE 1/2 TABLET(12.5 MG) BY MOUTH TWICE DAILY, Disp: 90 tablet, Rfl: 3   montelukast  (SINGULAIR ) 10 MG tablet, Take 10 mg by mouth at bedtime., Disp: , Rfl:    Multiple Vitamin (MULTIVITAMIN WITH MINERALS) TABS tablet, Take 1 tablet by mouth daily., Disp: , Rfl:    nitroGLYCERIN  (NITROSTAT ) 0.4 MG SL tablet, Place 1 tablet (0.4 mg total) under the tongue every 5 (five) minutes as needed for chest pain (up to 3 doses. If taking 3rd dose call 911)., Disp: 25 tablet, Rfl: 3   OXYGEN , Inhale into the lungs., Disp: , Rfl:    pantoprazole  (PROTONIX ) 40 MG tablet, TAKE 2 TABLETS(80 MG) BY MOUTH DAILY,  Disp: 180 tablet, Rfl: 3   potassium chloride  (KLOR-CON ) 10 MEQ tablet, Take 10 mEq by mouth See admin instructions. Take 1 tablet (10 mEq) by mouth on Monday's and Thursday's., Disp: , Rfl:    PROAIR  HFA 108 (90 BASE) MCG/ACT inhaler, Inhale 1-2 puffs into the lungs every 4 (four) hours as needed for wheezing., Disp: , Rfl:    rosuvastatin  (CRESTOR ) 20 MG tablet, Take 1 tablet (20 mg total) by mouth daily at 6 PM., Disp: 30 tablet, Rfl: 0      Objective:   There were no vitals filed for this visit.  Estimated body mass index is 33.49 kg/m as calculated from the following:   Height as of 07/15/24: 5' 5 (1.651 m).   Weight as of 08/26/24: 201 lb 4.5 oz (91.3 kg).  @WEIGHTCHANGE @  There were no vitals filed for this visit.   Physical Exam   General: No distress. *** O2 at rest: *** Cane present: *** Sitting in wheel chair: *** Frail: *** Obese: *** Neuro: Alert and Oriented x 3. GCS 15. Speech normal Psych: Pleasant Resp:  Barrel Chest - ***.  Wheeze - ***, Crackles - ***, No overt respiratory distress CVS: Normal heart sounds. Murmurs - *** Ext: Stigmata of Connective Tissue Disease - *** HEENT: Normal upper airway. PEERL +. No post nasal drip        Assessment/     Assessment & Plan    PLAN There are no Patient Instructions on file for this visit.    FOLLOWUP    No follow-ups on file.    SIGNATURE    Dr. Dorethia Cave, M.D., F.C.C.P,  Pulmonary and Critical Care Medicine Staff Physician, [facility] Center Director - Interstitial Lung Disease  Program  Pulmonary Fibrosis [facility] at [facility] [place], [place], [address]  Pager: [phone], If no answer  or between  15:00h - 7:00h: call [phone] Telephone: [phone]  10:07 PM 09/17/2024   Moderate Complexity MDM OFFICE  2021 E/M guidelines, first released in 2021, with minor revisions added in 2023 and 2024 Must meet the requirements for 2 out  of 3 dimensions to qualify.    Number and complexity of problems addressed Amount and/or complexity of data reviewed Risk of complications and/or morbidity  One or more chronic illness with mild exacerbation, OR progression, OR  side effects of treatment  Two or more stable chronic illnesses  One undiagnosed new problem with uncertain prognosis  One acute illness with systemic symptoms   One Acute complicated injury Must meet the requirements for 1 of 3 of the categories)  Category 1: Tests and documents, historian  Any combination of 3 of the following:  Assessment requiring an independent historian  Review of prior external note(s) from each unique source  Review of results of each unique test  Ordering of each unique test    Category 2: Interpretation of tests   Independent interpretation of a test performed by another physician/other qualified health care professional (not separately reported)  Category 3: Discuss management/tests  Discussion of management or test interpretation with external physician/other qualified health care professional/appropriate source (not separately reported) Moderate risk of morbidity from additional diagnostic testing or treatment Examples only:  Prescription drug management  Decision regarding minor surgery with identfied patient or procedure risk factors  Decision regarding elective major surgery without identified patient or procedure risk factors  Diagnosis or treatment significantly limited by social determinants of health             HIGh Complexity  OFFICE   2021 E/M guidelines, first released in 2021, with minor revisions added in 2023. Must meet the requirements for 2 out of 3 dimensions to qualify.    Number and complexity of problems addressed Amount and/or complexity of data reviewed Risk of complications and/or morbidity  Severe exacerbation of chronic illness  Acute or chronic illnesses that may pose a  threat to life or bodily function, e.g., multiple trauma, acute MI, pulmonary embolus, severe respiratory distress, progressive rheumatoid arthritis, psychiatric illness with potential threat to self or others, peritonitis, acute renal failure, abrupt change in neurological status Must meet the requirements for 2 of 3 of the categories)  Category 1: Tests and documents, historian  Any combination of 3 of the following:  Assessment requiring an independent historian  Review of prior external note(s) from each unique source  Review of results of each unique test  Ordering of each unique test    Category 2: Interpretation of tests    Independent interpretation of a test performed by another physician/other qualified health care professional (not separately reported)  Category 3: Discuss management/tests  Discussion of management or test interpretation with external physician/other qualified health care professional/appropriate source (not separately reported)  HIGH risk of morbidity from additional diagnostic testing or treatment Examples only:  Drug therapy requiring intensive monitoring for toxicity  Decision for elective major surgery with identified pateint or procedure risk factors  Decision regarding hospitalization or escalation of level of care  Decision for DNR or to de-escalate care   Parenteral controlled  substances            LEGEND - Independent interpretation involves the interpretation of a test for which there is a CPT code, and an interpretation or report is customary. When a review and interpretation of a test  is performed and documented by the provider, but not separately reported (billed), then this would represent an independent interpretation. This report does not need to conform to the usual standards of a complete report of the test. This does not include interpretation of tests that do not have formal reports such as a complete blood count with  differential and blood cultures. Examples would include reviewing a chest radiograph and documenting in the medical record an interpretation, but not separately reporting (billing) the interpretation of the chest radiograph.   An appropriate source includes professionals who are not health care professionals but may be involved in the management of the patient, such as a clinical research associate, upper officer, case manager or teacher, and does not include discussion with family or informal caregivers.    - SDOH: SDOH are the conditions in the environments where people are born, live, learn, work, play, worship, and age that affect a wide range of health, functioning, and quality-of-life outcomes and risks. (e.g., housing, food insecurity, transportation, etc.). SDOH-related Z codes ranging from Z55-Z65 are the ICD-10-CM diagnosis codes used to document SDOH data Z55 - Problems related to education and literacy Z56 - Problems related to employment and unemployment Z57 - Occupational exposure to risk factors Z58 - Problems related to physical environment Z59 - Problems related to housing and economic circumstances [phone] - Problems related to social environment [phone] - Problems related to upbringing [phone] - Other problems related to primary support group, including family circumstances Z28 - Problems related to certain psychosocial circumstances Z65 - Problems related to other psychosocial circumstances

## 2024-09-17 NOTE — Patient Instructions (Signed)
 Interstitial lung disease due to connective tissue disease (HCC)  - We decided on treating you with the immunomodulator because of the fact you have Sjogren's and also the pattern on CT suggest high risk for progression and the slight decline in lung function over 2024 and 2025.  -Respect the fact that you now in retrospect think with the symptoms you faced after starting CellCept  or not related to CellCept   Plan  - Restart CellCept  at 500 mg twice daily [sending a message to our pharmacist Aleck Puls to have a conversation with you before restarting   History of pulmonary hypertension -documented on right heart cath in 2017  -Despite normal echo in 2020 for August the decline in diffusion capacity suggests that the pulmonary hypertension could be ongoing  Plan - I have sent a message to Dr. Ozell Fell to consider repeat right heart catheterization  - Start inhaled treprostinil if there is WHO group 3 pulmonary hypertension    Follow-up -6-week visit with nurse practitioner  -12-week visit with Dr. Geronimo 30 minutes but after pulmonary function testing

## 2024-09-18 ENCOUNTER — Encounter: Payer: Self-pay | Admitting: Internal Medicine

## 2024-09-18 ENCOUNTER — Telehealth: Payer: Self-pay | Admitting: Internal Medicine

## 2024-09-18 ENCOUNTER — Ambulatory Visit: Admitting: Internal Medicine

## 2024-09-18 VITALS — BP 132/79 | HR 55 | Temp 98.3°F | Ht 65.0 in | Wt 197.2 lb

## 2024-09-18 DIAGNOSIS — M359 Systemic involvement of connective tissue, unspecified: Secondary | ICD-10-CM

## 2024-09-18 DIAGNOSIS — J8489 Other specified interstitial pulmonary diseases: Secondary | ICD-10-CM

## 2024-09-18 DIAGNOSIS — Z8679 Personal history of other diseases of the circulatory system: Secondary | ICD-10-CM

## 2024-09-18 NOTE — Telephone Encounter (Signed)
 [facility]  Patient and I met today.  She wants to try rechallenge with CellCept .  Some sending a message to get this initiated at 500 mg twice daily

## 2024-09-18 NOTE — Telephone Encounter (Signed)
 Donna Williamson - *you are seeing her in 2 days.  In 2017 needed a right heart cath and she had pulmonary hypertension.  Currently she has ILD presumably due to Sjogren's.  Her DLCO is declining relative to the [facility].  This increases the odds that the pulmonary hypertension is worse  Plan - Could you please consider standard of care right heart catheterization ? = She will meet indication for inhaled treprostinil if she does have WHO group 3 pulmonary hypertension   Thanks for your consideration      SIGNATURE    Dr. Dorethia Cave, M.D., F.C.C.P,  Pulmonary and Critical Care Medicine Staff Physician, [facility] Center Director - Interstitial Lung Disease  Program  Pulmonary Fibrosis [facility] Network at [facility] [place], [place], [address]   Pager: [phone], If no answer  -> Check AMION or Try [phone] Telephone (clinical office): [phone] Telephone (research): [phone]  4:31 PM 09/18/2024

## 2024-09-19 ENCOUNTER — Encounter (HOSPITAL_COMMUNITY)

## 2024-09-19 NOTE — Telephone Encounter (Signed)
 Spoke to patient - she requests call tomorrow afternoon as she was unable to discuss at time of my call today. Scheduled to discuss with pharmacy team on 11/21 at 1:30 PM.

## 2024-09-20 ENCOUNTER — Encounter: Payer: Self-pay | Admitting: Cardiovascular Disease

## 2024-09-20 ENCOUNTER — Ambulatory Visit

## 2024-09-20 ENCOUNTER — Ambulatory Visit: Attending: Cardiovascular Disease | Admitting: Cardiovascular Disease

## 2024-09-20 VITALS — BP 130/76 | HR 68 | Ht 65.0 in | Wt 196.2 lb

## 2024-09-20 DIAGNOSIS — Z79899 Other long term (current) drug therapy: Secondary | ICD-10-CM

## 2024-09-20 DIAGNOSIS — I350 Nonrheumatic aortic (valve) stenosis: Secondary | ICD-10-CM | POA: Diagnosis not present

## 2024-09-20 DIAGNOSIS — E785 Hyperlipidemia, unspecified: Secondary | ICD-10-CM

## 2024-09-20 DIAGNOSIS — I25119 Atherosclerotic heart disease of native coronary artery with unspecified angina pectoris: Secondary | ICD-10-CM

## 2024-09-20 DIAGNOSIS — J849 Interstitial pulmonary disease, unspecified: Secondary | ICD-10-CM

## 2024-09-20 DIAGNOSIS — I5032 Chronic diastolic (congestive) heart failure: Secondary | ICD-10-CM

## 2024-09-20 LAB — BASIC METABOLIC PANEL WITH GFR
BUN/Creatinine Ratio: 16 (ref 12–28)
BUN: 16 mg/dL (ref 8–27)
CO2: 24 mmol/L (ref 20–29)
Calcium: 9.4 mg/dL (ref 8.7–10.3)
Chloride: 101 mmol/L (ref 96–106)
Creatinine, Ser: 0.99 mg/dL (ref 0.57–1.00)
Glucose: 88 mg/dL (ref 70–99)
Potassium: 3.9 mmol/L (ref 3.5–5.2)
Sodium: 140 mmol/L (ref 134–144)
eGFR: 58 mL/min/1.73 — ABNORMAL LOW (ref >59)

## 2024-09-20 LAB — CBC
Hematocrit: 35.6 % (ref 34.0–46.6)
Hemoglobin: 11 g/dL — ABNORMAL LOW (ref 11.1–15.9)
MCH: 30.9 pg (ref 26.6–33.0)
MCHC: 30.9 g/dL — ABNORMAL LOW (ref 31.5–35.7)
MCV: 100 fL — ABNORMAL HIGH (ref 79–97)
Platelets: 296 x10E3/uL (ref 150–450)
RBC: 3.56 x10E6/uL — ABNORMAL LOW (ref 3.77–5.28)
RDW: 13.4 % (ref 11.7–15.4)
WBC: 8.9 x10E3/uL (ref 3.4–10.8)

## 2024-09-20 MED ORDER — MYCOPHENOLATE MOFETIL 500 MG PO TABS: 500.0000 mg | ORAL_TABLET | Freq: Two times a day (BID) | ORAL | 0 refills | Status: DC

## 2024-09-20 NOTE — H&P (View-Only) (Signed)
 Cardiology Office Note:    Date:  09/20/2024   ID:  Donna Williamson, Donna Williamson 10-Jun-1946, MRN [id number]  PCP:  Shayne Anes, MD   [facility] Providers Cardiologist:  Ozell Fell, MD     Referring MD: Shayne Anes, MD   Chief Complaint  Patient presents with   Shortness of Breath    History of Present Illness:    Donna Williamson is a 78 y.o. female with a hx of:  Coronary artery disease  Intol to Brilinta  (epistaxis); Allergy to Plavix   NSTEMI 04/2016 s/p DES to OM1 Myoview  3/21: low risk [place]  01/2021 s/p 2.5 x 16 mm DES to LAD and POBA of D2  Residual CAD on cath 02/17/21: pLAD 40, pLCx 40, OM1 stent patent Effient  Rx due to allergy to Brilinta  and Plavix   Myoview  02/08/2023: Normal perfusion, EF 51 (HFpEF) heart failure with preserved ejection fraction  TTE 02/18/2021: EF 60-65  TTE 02/28/2023: EF 55-60, no RWMA, mild asymmetric LVH, NL RVSF, mild MR, mild aortic stenosis (bulky subvalvular calcification), mean AV gradient 9.6, V-max 199 cm/s, RAP 3 Mild aortic stenosis  Parox Supraventricular Tachycardia  Monitor 06/2019: NSR avg HR 67, short SVT (8-10 beats), PVCs < 1% burden  Hypertension  Hyperlipidemia  Chronic kidney disease stage III Chronic Obstructive Pulmonary Disease Pulmonary fibrosis/ILD (Interstitial Lung Disease)  Pulm: Dr. Geronimo GERD Sjogren's syndrome  Sleep apnea  Carotid US  12/2013: Normal bilateral carotid arteries               She is here with her husband today. States that she has been very fatigued for the past 6 months. She was hospitalized in April for 6 days for hypoxic respiratory failure felt to be related to ILD flare versus multifocal pneumonia. She's on 2L of O2 at rest, and 4L O2 with activity.  She has occasional chest discomfort but this is rare and unchanged in frequency or severity.  No orthopnea or PND.  No recent problems with leg swelling.  States that she coughs up a lot of clear phlegm.  Current Medications: Current  Meds  Medication Sig   acetaminophen  (TYLENOL ) 500 MG tablet Take 500-1,000 mg by mouth every 6 (six) hours as needed for moderate pain (pain score 4-6).   albuterol  (PROVENTIL ) (2.5 MG/3ML) 0.083% nebulizer solution Take 3 mLs (2.5 mg total) by nebulization every 2 (two) hours as needed for wheezing or shortness of breath.   ALPRAZolam  (XANAX ) 0.5 MG tablet Take 0.5 mg by mouth daily as needed for anxiety.    aspirin  EC 81 MG tablet Take 81 mg by mouth daily.   carboxymethylcellulose (REFRESH PLUS) 0.5 % SOLN Place 1 drop into both eyes daily as needed (dry eyes).   Cholecalciferol  (VITAMIN D3) 2000 UNITS TABS Take 2,000 Units by mouth daily.   cyanocobalamin  500 MCG tablet Take 1,000 mcg by mouth daily.   EPIPEN  2-PAK 0.3 MG/0.3ML SOAJ injection Inject 0.3 mg into the skin daily as needed (allergic reaction).    ezetimibe  (ZETIA ) 10 MG tablet Take 10 mg by mouth at bedtime.   fluticasone  (FLONASE ) 50 MCG/ACT nasal spray Place 2 sprays into the nose daily.   furosemide  (LASIX ) 20 MG tablet Take 1 tablet (20 mg total) by mouth daily.   inclisiran (LEQVIO ) 284 MG/1.5ML SOSY injection Inject 284 mg into the skin once.   isosorbide  mononitrate (IMDUR ) 30 MG 24 hr tablet TAKE 1 TABLET(30 MG) BY MOUTH DAILY   metoprolol  tartrate (LOPRESSOR ) 25 MG tablet TAKE 1/2 TABLET(12.5 MG)  BY MOUTH TWICE DAILY   montelukast  (SINGULAIR ) 10 MG tablet Take 10 mg by mouth at bedtime.   Multiple Vitamin (MULTIVITAMIN WITH MINERALS) TABS tablet Take 1 tablet by mouth daily.   nitroGLYCERIN  (NITROSTAT ) 0.4 MG SL tablet Place 1 tablet (0.4 mg total) under the tongue every 5 (five) minutes as needed for chest pain (up to 3 doses. If taking 3rd dose call 911).   OXYGEN  Inhale into the lungs.   pantoprazole  (PROTONIX ) 40 MG tablet TAKE 2 TABLETS(80 MG) BY MOUTH DAILY   potassium chloride  (KLOR-CON ) 10 MEQ tablet Take 10 mEq by mouth See admin instructions. Take 1 tablet (10 mEq) by mouth on Monday's and Thursday's.    PROAIR  HFA 108 (90 BASE) MCG/ACT inhaler Inhale 1-2 puffs into the lungs every 4 (four) hours as needed for wheezing.   rosuvastatin  (CRESTOR ) 20 MG tablet Take 1 tablet (20 mg total) by mouth daily at 6 PM.     Allergies:   Levofloxacin, Plavix  [clopidogrel  bisulfate], Azathioprine , Ciprofibrate, Codeine, Drug ingredient [ticagrelor ], Nitrofurantoin , Ciprofloxacin, Penicillins, and Sulfa antibiotics   ROS:   Please see the history of present illness.    All other systems reviewed and are negative.  EKGs/Labs/Other Studies Reviewed:    The following studies were reviewed today: Cardiac Studies & Procedures   ______________________________________________________________________________________________ CARDIAC CATHETERIZATION  CARDIAC CATHETERIZATION 02/17/2021  Conclusion  Ost 2nd Diag to 2nd Diag lesion is 70% stenosed.  Previously placed 1st Mrg stent (unknown type) is widely patent.  Prox Cx to Mid Cx lesion is 40% stenosed.  Prox LAD to Mid LAD lesion is 40% stenosed.  2nd Diag lesion is 80% stenosed.  Mid LAD lesion is 80% stenosed.  Post intervention, there is a 80% residual stenosis.  A drug-eluting stent was successfully placed using a SYNERGY XD 2.50X16.  Post intervention, there is a 0% residual stenosis.  1. Moderate, calcified proximal LAD stenosis. 2. Severe mid LAD stenosis at the takeoff of the moderate caliber second Diagonal branch. RFR of the mid LAD lesion was 0.84 suggesting the lesion was flow limiting. The ostium of the moderate caliber second Diagonal branch had a severe stenosis. 3. Patent obtuse marginal stent 4. Non-dominant RCA with mild plaque 5. Successful balloon angioplasty of the ostium of the Diagonal branch. This was done prior to stenting of the LAD. TIMI-3 flow down the Diagonal branch post LAD stenting. 6. Successful PTCA/DES x 1 mid LAD. 7. Preserved LV systolic function.  Recommendations: Will continue DAPT with ASA and Plavix  for  at least six months. Continue statin. I would expect that she will be watched overnight and discharged home tomorrow.  Findings Coronary Findings Diagnostic  Dominance: Co-dominant  Left Anterior Descending Prox LAD to Mid LAD lesion is 40% stenosed. The lesion is calcified. Mid LAD lesion is 80% stenosed. Pressure wire/FFR was performed on the lesion. RFR 0.84  Second Diagonal Branch Vessel is moderate in size. Ost 2nd Diag to 2nd Diag lesion is 70% stenosed. 2nd Diag lesion is 80% stenosed.  Left Circumflex Prox Cx to Mid Cx lesion is 40% stenosed.  First Obtuse Marginal Branch Previously placed 1st Mrg stent (unknown type) is widely patent.  Right Coronary Artery Vessel is moderate in size. The vessel exhibits minimal luminal irregularities.  Intervention  Mid LAD lesion Stent CATH VISTA GUIDE 6FR XBLAD3.5 guide catheter was inserted. Lesion crossed with guidewire using a WIRE COUGAR XT STRL 190CM. Pre-stent angioplasty was performed using a BALLOON SAPPHIRE 2.0X12. A drug-eluting stent was successfully placed using  a SYNERGY XD 2.50X16. Stent strut is well apposed. Post-stent angioplasty was performed using a BALLOON SAPPHIRE [facility] [id number]. Post-Intervention Lesion Assessment The intervention was successful. Pre-interventional TIMI flow is 3. Post-intervention TIMI flow is 3. No complications occurred at this lesion. There is a 0% residual stenosis post intervention.  2nd Diag lesion Angioplasty Post-Intervention Lesion Assessment The intervention was successful. Pre-interventional TIMI flow is 3. Post-intervention TIMI flow is 3. No complications occurred at this lesion. There is a 80% residual stenosis post intervention.   CARDIAC CATHETERIZATION  CARDIAC CATHETERIZATION 05/02/2016  Conclusion  Prox LAD to Mid LAD lesion, 30% stenosed.  Mid LAD lesion, 50% stenosed at bifurcation of Ost 2nd Diag to 2nd Diag lesion, 70% stenosed.  1st Mrg-1 lesion, 95% stenosed. Post  intervention with a 2.5 x 16 Synergy drug eluting stent post dilated to 2.8 mm, there is a 0% residual stenosis.  Moderate pulmonary HTN.  Elevated LVEDP. CO 8.7 L/min. CI 3.96.  Continue dual antiplatelet therapy ideally for a year. She does have anemia but has had bleeding source is ruled out. Brilinta  started. She would be a candidate for TWILIGHT study.  She will need some diuresis over the next few days.  Findings Coronary Findings Diagnostic  Dominance: Co-dominant  Left Anterior Descending Calcified. located at the major branch.  Second Diagonal Branch  Left Circumflex  First Obtuse Marginal Branch Thrombotic.  Right Coronary Artery The vessel exhibits minimal luminal irregularities.  Intervention  1st Mrg-1 lesion PCI The pre-interventional distal flow is normal (TIMI 3). Pre-stent angioplasty was performed. A drug-eluting stent was placed. The strut is apposed. Post-stent angioplasty was performed. The post-interventional distal flow is normal (TIMI 3). The intervention was successful. No complications occurred at this lesion. Supplies used: BALLOON EMERGE MR 2.5X15; STENT SYNERGY DES 2.5X16; BALLOON Yeager EMERGE MR [id number] There is a 0% residual stenosis post intervention.   STRESS TESTS  MYOCARDIAL PERFUSION IMAGING 02/08/2023  Interpretation Summary   The study is normal. The study is low risk.   No ST deviation was noted.   LV perfusion is normal.   Left ventricular function is abnormal. Global function is mildly reduced. Nuclear stress EF: 51 %. The left ventricular ejection fraction is mildly decreased (45-54%). End diastolic cavity size is normal.   Prior study available for comparison from 01/20/2020. No changes compared to prior study.  Low risk stress nuclear study with normal perfusion. Borderline reduced left ventricular global systolic function, similar to the previous study.   ECHOCARDIOGRAM  ECHOCARDIOGRAM COMPLETE  06/17/2024  Narrative ECHOCARDIOGRAM REPORT    Patient Name:   Donna Williamson Date of Exam: 06/17/2024 Medical Rec #:  [id number]         Height:       65.0 in Accession #:    [id number]        Weight:       211.0 lb Date of Birth:  Jun 12, 1946         BSA:          2.024 m Patient Age:    78 years          BP:           108/60 mmHg Patient Gender: F                 HR:           72 bpm. Exam Location:  Outpatient  Procedure: 3D Echo, 2D Echo, Color Doppler and Cardiac Doppler (Both Spectral and Color Flow  Doppler were utilized during procedure).  Indications:    Dyspnea  History:        Patient has prior history of Echocardiogram examinations, most recent 02/28/2023. Previous Myocardial Infarction, Arrythmias:PVC, Signs/Symptoms:Chest Pain; Risk Factors:Non-Smoker, Dyslipidemia and Hypertension.  Sonographer:    Orvil Holmes RDCS Referring Phys: 79 MURALI RAMASWAMY  IMPRESSIONS   1. Left ventricular ejection fraction, by estimation, is 55 to 60%. The left ventricle has normal function. Left ventricular diastolic parameters are indeterminate. 2. Right ventricular systolic function is low normal. The right ventricular size is normal. 3. Trivial mitral valve regurgitation. 4. AV is thickend, calcified Difficult to see individual leaflets well Peak and mean gradients through the valve are 15 and 8 mm Hg. Probable mild AS. Further estimation of valve area limited by data for LVOT.SABRA The aortic valve has an indeterminant number of cusps. Aortic valve regurgitation is not visualized. 5. The inferior vena cava is normal in size with greater than 50% respiratory variability, suggesting right atrial pressure of 3 mmHg.  FINDINGS Left Ventricle: Left ventricular ejection fraction, by estimation, is 55 to 60%. The left ventricle has normal function. Global longitudinal strain performed but not reported based on interpreter judgement due to suboptimal tracking. 3D ejection fraction  reviewed and evaluated as part of the interpretation. Alternate measurement of EF is felt to be most reflective of LV function. The left ventricular internal cavity size was normal in size. There is no left ventricular hypertrophy. Left ventricular diastolic parameters are indeterminate.  Right Ventricle: The right ventricular size is normal. Right vetricular wall thickness was not assessed. Right ventricular systolic function is low normal.  Left Atrium: Left atrial size was normal in size.  Right Atrium: Right atrial size was normal in size.  Pericardium: There is no evidence of pericardial effusion.  Mitral Valve: There is mild thickening of the mitral valve leaflet(s). Mild to moderate mitral annular calcification. Trivial mitral valve regurgitation.  Tricuspid Valve: The tricuspid valve is normal in structure. Tricuspid valve regurgitation is trivial.  Aortic Valve: AV is thickend, calcified Difficult to see individual leaflets well Peak and mean gradients through the valve are 15 and 8 mm Hg. Probable mild AS. Further estimation of valve area limited by data for LVOT. The aortic valve has an indeterminant number of cusps. Aortic valve regurgitation is not visualized. Aortic valve mean gradient measures 8.0 mmHg. Aortic valve peak gradient measures 15.3 mmHg. Aortic valve area, by VTI measures 1.56 cm.  Pulmonic Valve: The pulmonic valve was not well visualized. Pulmonic valve regurgitation is not visualized.  Aorta: The aortic root and ascending aorta are structurally normal, with no evidence of dilitation.  Venous: The inferior vena cava is normal in size with greater than 50% respiratory variability, suggesting right atrial pressure of 3 mmHg.  IAS/Shunts: No atrial level shunt detected by color flow Doppler.   LEFT VENTRICLE PLAX 2D LVIDd:         4.68 cm   Diastology LVIDs:         3.30 cm   LV e' medial:    5.87 cm/s LV PW:         1.03 cm   LV E/e' medial:  15.1 LV IVS:         1.15 cm   LV e' lateral:   7.51 cm/s LVOT diam:     2.20 cm   LV E/e' lateral: 11.8 LV SV:         62 LV SV Index:   30 LVOT  Area:     3.80 cm  3D Volume EF: 3D EF:        50 % LV EDV:       137 ml LV ESV:       69 ml LV SV:        68 ml  RIGHT VENTRICLE RV Basal diam:  3.98 cm RV Mid diam:    3.25 cm RV S prime:     14.40 cm/s TAPSE (M-mode): 3.4 cm  LEFT ATRIUM           Index        RIGHT ATRIUM           Index LA diam:      4.20 cm 2.08 cm/m   RA Area:     13.70 cm LA Vol (A2C): 64.6 ml 31.92 ml/m  RA Volume:   29.70 ml  14.68 ml/m LA Vol (A4C): 55.3 ml 27.33 ml/m AORTIC VALVE AV Area (Vmax):    1.44 cm AV Area (Vmean):   1.45 cm AV Area (VTI):     1.56 cm AV Vmax:           195.50 cm/s AV Vmean:          131.000 cm/s AV VTI:            0.396 m AV Peak Grad:      15.3 mmHg AV Mean Grad:      8.0 mmHg LVOT Vmax:         73.90 cm/s LVOT Vmean:        49.800 cm/s LVOT VTI:          0.162 m LVOT/AV VTI ratio: 0.41  AORTA Ao Root diam: 2.80 cm Ao Asc diam:  2.90 cm  MITRAL VALVE MV Area (PHT): 3.21 cm     SHUNTS MV Decel Time: 236 msec     Systemic VTI:  0.16 m MV E velocity: 88.60 cm/s   Systemic Diam: 2.20 cm MV A velocity: 102.00 cm/s MV E/A ratio:  0.87  Vina Gull MD Electronically signed by Vina Gull MD Signature Date/Time: 06/17/2024/11:04:29 PM    Final    MONITORS  LONG TERM MONITOR (3-14 DAYS) 06/13/2019  Narrative 1) The basic rhythm is normal sinus with an average HR of 67 bpm 2) Occasional short runs of SVT 8-10 beats 3) Occasional PVC's with burden < 1% 4) No sustained arrhythmia, bradycardic events, or pathologic pauses       ______________________________________________________________________________________________      EKG:        Recent Labs: 02/12/2024: Magnesium  2.3 04/03/2024: B Natriuretic Peptide 54.4 05/22/2024: Pro B Natriuretic peptide (BNP) 337.0 07/15/2024: ALT 11; BUN 10; Creatinine, Ser 1.11;  Hemoglobin 10.7; Platelets 302.0; Potassium 3.6; Sodium 139  Recent Lipid Panel    Component Value Date/Time   CHOL 131 02/17/2021 0154   TRIG 132 02/17/2021 0154   HDL 46 02/17/2021 0154   CHOLHDL 2.8 02/17/2021 0154   VLDL 26 02/17/2021 0154   LDLCALC 59 02/17/2021 0154     Risk Assessment/Calculations:           STOP-Bang Score:          Physical Exam:    VS:  BP 130/76 (BP Location: Right Arm, Patient Position: Sitting, Cuff Size: Normal)   Pulse 68   Ht 5' 5 (1.651 m)   Wt 196 lb 3.2 oz (89 kg)   SpO2 94%   BMI 32.65 kg/m     Wt  Readings from Last 3 Encounters:  09/20/24 196 lb 3.2 oz (89 kg)  09/18/24 197 lb 3.2 oz (89.4 kg)  08/26/24 201 lb 4.5 oz (91.3 kg)     GEN:  Well nourished, well developed in no acute distress HEENT: Normal NECK: No JVD; No carotid bruits LYMPHATICS: No lymphadenopathy CARDIAC: RRR, 2/6 early peaking ejection murmur at the right upper sternal border RESPIRATORY: Scattered rales with good air movement ABDOMEN: Soft, non-tender, non-distended MUSCULOSKELETAL:  No edema; No deformity  SKIN: Warm and dry NEUROLOGIC:  Alert and oriented x 3 PSYCHIATRIC:  Normal affect   Assessment & Plan Coronary artery disease involving native coronary artery of native heart with angina pectoris Clinically stable with rare episodes of angina.  Continue aspirin , isosorbide , and metoprolol  at current doses. Mild aortic stenosis Echocardiogram August 2025 shows normal LVEF 55 to 60%, low normal RV function, thickened aortic valve with peak and mean gradients of 15 and 8 mmHg, respectively with probable mild aortic stenosis.  Continue observation. Chronic heart failure with preserved ejection fraction (HCC) Functional class II-III limitation likely related to her pulmonary fibrosis.  Reviewed pulmonology notes with Dr. Geronimo.  Recommend right heart catheterization to evaluate for pulmonary hypertension.  Reviewed risks, indications, and alternatives  of right heart catheterization with the patient.  She understands risks of arrhythmia, vascular injury, cardiac injury, stroke, or MI, are extremely rare with right heart catheterization alone. Hyperlipidemia LDL goal <70 Treated with rosuvastatin  and inclisiran.  Last lipids with an LDL of 19.       Informed Consent   Shared Decision Making/Informed Consent The risks, including but not limited to, [bleeding or vascular complications (1 in 500), pneumothorax (1 in 1600), arrhythmia (1 in 1000) and death (1 in 5000)], benefits (diagnostic support and/or management of heart failure, pulmonary hypertension) and alternatives of a right heart catheterization were discussed in detail with Donna Williamson and she is willing to proceed.       Medication Adjustments/Labs and Tests Ordered: Current medicines are reviewed at length with the patient today.  Concerns regarding medicines are outlined above.  Orders Placed This Encounter  Procedures   Basic metabolic panel with GFR   CBC   No orders of the defined types were placed in this encounter.   Patient Instructions  Medication Instructions:  No medication changes were made at this visit. Continue current regimen.   *If you need a refill on your cardiac medications before your next appointment, please call your pharmacy*  Lab Work: To be completed today: BMP and CBC  If you have labs (blood work) drawn today and your tests are completely normal, you will receive your results only by: MyChart Message (if you have MyChart) OR A paper copy in the mail If you have any lab test that is abnormal or we need to change your treatment, we will call you to review the results.  Testing/Procedures: Your physician has requested that you have a cardiac catheterization. Cardiac catheterization is used to diagnose and/or treat various heart conditions. Doctors may recommend this procedure for a number of different reasons. The most common reason is to  evaluate chest pain. Chest pain can be a symptom of coronary artery disease (CAD), and cardiac catheterization can show whether plaque is narrowing or blocking your heart's arteries. This procedure is also used to evaluate the valves, as well as measure the blood flow and oxygen  levels in different parts of your heart. For further information please visit [email or web address] Please follow instruction sheet, as  given.   Follow-Up: At [facility], you and your health needs are our priority.  As part of our continuing mission to provide you with exceptional heart care, our providers are all part of one team.  This team includes your primary Cardiologist (physician) and Advanced Practice Providers or APPs (Physician Assistants and Nurse Practitioners) who all work together to provide you with the care you need, when you need it.  Your next appointment:   1 year(s)  Provider:   Ozell Fell, MD    We recommend signing up for the patient portal called MyChart.  Sign up information is provided on this After Visit Summary.  MyChart is used to connect with patients for Virtual Visits (Telemedicine).  Patients are able to view lab/test results, encounter notes, upcoming appointments, etc.  Non-urgent messages can be sent to your provider as well.   To learn more about what you can do with MyChart, go to [email or web address].   Other Instructions       Cardiac/Peripheral Catheterization   You are scheduled for a Cardiac Catheterization on Thursday, December 18 with Dr. Ozell Fell.  1. Please arrive at the [facility] (Main Entrance A) at [facility]: [address] [place], [place] [address] at 9:00 AM (This time is 2 hour(s) before your procedure to ensure your preparation). Your procedure is scheduled to begin at 11 AM.  Free valet parking service is available. You will check in at ADMITTING. The support person will be asked to wait in the waiting room.  It is OK to  have someone drop you off and come back when you are ready to be discharged.        Special note: Every effort is made to have your procedure done on time. Please understand that emergencies sometimes delay scheduled procedures.  2. Diet: Nothing to eat after midnight.  3. Hydration:You need to be well hydrated before your procedure. On December 18, you may drink approved liquids (see below) until 2 hours before the procedure, with 16 oz of water as your last intake.   List of approved liquids water, clear juice, clear tea, black coffee, fruit juices, non-citric and without pulp, carbonated beverages, Gatorade, Kool -Aid, plain Jello-O and plain ice popsicles.  4. Labs: You will need to have blood drawn on Friday, November 21 at [facility] (1st Floor), [address] [place], [place] [address]. You do not need to be fasting.  5. Medication instructions in preparation for your procedure:   Contrast Allergy: No  Stop taking, Lasix  (Furosemide )  Wednesday, December 17,, Isosorbide  Mononitrate (Imdur ) Wednesday, December 17, your last dose of these two medications will be on Tuesday, December 16.   On the morning of your procedure, take Aspirin  81 mg and any morning medicines NOT listed above.  You may use sips of water.  6. Plan to go home the same day, you will only stay overnight if medically necessary. 7. You MUST have a responsible adult to drive you home. 8. An adult MUST be with you the first 24 hours after you arrive home. 9. Bring a current list of your medications, and the last time and date medication taken. 10. Bring ID and current insurance cards. 11.Please wear clothes that are easy to get on and off and wear slip-on shoes.  Thank you for allowing us  to care for you!   -- [facility]     Signed, Ozell  Wonda, MD  09/20/2024 1:04 PM     [facility]

## 2024-09-20 NOTE — Assessment & Plan Note (Signed)
 Clinically stable with rare episodes of angina.  Continue aspirin , isosorbide , and metoprolol  at current doses.

## 2024-09-20 NOTE — Patient Instructions (Signed)
 Medication Instructions:  No medication changes were made at this visit. Continue current regimen.   *If you need a refill on your cardiac medications before your next appointment, please call your pharmacy*  Lab Work: To be completed today: BMP and CBC  If you have labs (blood work) drawn today and your tests are completely normal, you will receive your results only by: MyChart Message (if you have MyChart) OR A paper copy in the mail If you have any lab test that is abnormal or we need to change your treatment, we will call you to review the results.  Testing/Procedures: Your physician has requested that you have a cardiac catheterization. Cardiac catheterization is used to diagnose and/or treat various heart conditions. Doctors may recommend this procedure for a number of different reasons. The most common reason is to evaluate chest pain. Chest pain can be a symptom of coronary artery disease (CAD), and cardiac catheterization can show whether plaque is narrowing or blocking your heart's arteries. This procedure is also used to evaluate the valves, as well as measure the blood flow and oxygen  levels in different parts of your heart. For further information please visit [email or web address] Please follow instruction sheet, as given.   Follow-Up: At [facility], you and your health needs are our priority.  As part of our continuing mission to provide you with exceptional heart care, our providers are all part of one team.  This team includes your primary Cardiologist (physician) and Advanced Practice Providers or APPs (Physician Assistants and Nurse Practitioners) who all work together to provide you with the care you need, when you need it.  Your next appointment:   1 year(s)  Provider:   Ozell Fell, MD    We recommend signing up for the patient portal called MyChart.  Sign up information is provided on this After Visit Summary.  MyChart is used to connect with patients  for Virtual Visits (Telemedicine).  Patients are able to view lab/test results, encounter notes, upcoming appointments, etc.  Non-urgent messages can be sent to your provider as well.   To learn more about what you can do with MyChart, go to [email or web address].   Other Instructions       Cardiac/Peripheral Catheterization   You are scheduled for a Cardiac Catheterization on Thursday, December 18 with Dr. Ozell Fell.  1. Please arrive at the [facility] (Main Entrance A) at [facility]: [address] [place], [place] [address] at 9:00 AM (This time is 2 hour(s) before your procedure to ensure your preparation). Your procedure is scheduled to begin at 11 AM.  Free valet parking service is available. You will check in at ADMITTING. The support person will be asked to wait in the waiting room.  It is OK to have someone drop you off and come back when you are ready to be discharged.        Special note: Every effort is made to have your procedure done on time. Please understand that emergencies sometimes delay scheduled procedures.  2. Diet: Nothing to eat after midnight.  3. Hydration:You need to be well hydrated before your procedure. On December 18, you may drink approved liquids (see below) until 2 hours before the procedure, with 16 oz of water as your last intake.   List of approved liquids water, clear juice, clear tea, black coffee, fruit juices, non-citric and without pulp, carbonated beverages, Gatorade, Kool -Aid, plain Jello-O and plain ice popsicles.  4. Labs: You will need to have  blood drawn on Friday, November 21 at [facility] (1st Floor), [address] [place], [place] [address]. You do not need to be fasting.  5. Medication instructions in preparation for your procedure:   Contrast Allergy: No  Stop taking, Lasix  (Furosemide )  Wednesday, December 17,, Isosorbide  Mononitrate (Imdur ) Wednesday, December 17,  your last dose of these two medications will be on Tuesday, December 16.   On the morning of your procedure, take Aspirin  81 mg and any morning medicines NOT listed above.  You may use sips of water.  6. Plan to go home the same day, you will only stay overnight if medically necessary. 7. You MUST have a responsible adult to drive you home. 8. An adult MUST be with you the first 24 hours after you arrive home. 9. Bring a current list of your medications, and the last time and date medication taken. 10. Bring ID and current insurance cards. 11.Please wear clothes that are easy to get on and off and wear slip-on shoes.  Thank you for allowing us  to care for you!   -- [facility]

## 2024-09-20 NOTE — Progress Notes (Addendum)
 [facility]  Referring Provider: Dr. Geronimo  Virtual Visit via Telephone Note  I connected with Donna Williamson on 09/20/24 at  1:30 PM EST by telephone and verified that I am speaking with the correct person using two identifiers.  Location: Patient: home Provider: office   I discussed the limitations, risks, security and privacy concerns of performing an evaluation and management service by telephone and the availability of in person appointments. I also discussed with the patient that there may be a patient responsible charge related to this service. The patient expressed understanding and agreed to proceed.   HPI: Donna Williamson is a 78 y.o. female who presents to the pharmacotherapy clinic via telephone for restart CellCept  counseling. Followed by rheumatology and pulmonary. She has CTD-associated ILD. Previously on CellCept  from 06/05/24, called to complain of side effects (fatigue, headache, decreased appetite) on 06/06/24. She stopped taking medication. After discussion with Dr. Geronimo at last OV on 09/18/24, she is agreeable to retrial. Today, she reports she believed she did not give the medication a chance. Would like to restart CellCept .  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   NSTEMI (non-ST elevated myocardial infarction) (HCC) 05/02/2016   Chronic heart failure with preserved ejection fraction (HCC)    Hyperglycemia 05/01/2016   Mild anemia 05/01/2016   Anxiety  05/01/2016   Depression 05/01/2016   Primary osteoarthritis of left hip 10/23/2015   Unilateral primary osteoarthritis, left knee 10/23/2015   Porokeratosis 09/21/2015   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   ALBUTEROL  (PROVENTIL ) (2.5 MG/3ML) 0.083% NEBULIZER SOLUTION    Take 3 mLs (2.5 mg total) by nebulization every 2 (two) hours as needed for wheezing or shortness of breath.   ALPRAZOLAM  (XANAX ) 0.5 MG TABLET    Take 0.5 mg by mouth daily as needed for anxiety.    ASPIRIN  EC 81 MG TABLET    Take 81 mg by mouth daily.   BENZONATATE  (TESSALON ) 200 MG CAPSULE    Take 200 mg by mouth 3 (three) times daily as needed for cough.   BUDESONIDE-FORMOTEROL  (SYMBICORT) 160-4.5 MCG/ACT INHALER    Inhale 2 puffs into the lungs 2 (two) times daily.   CARBOXYMETHYLCELLULOSE (REFRESH PLUS) 0.5 % SOLN    Place 1 drop into both eyes daily as needed (dry eyes).   CEFDINIR  (OMNICEF ) 300 MG CAPSULE    Take 1 capsule (300 mg total) by mouth 2 (two) times daily.   CHOLECALCIFEROL  (VITAMIN D3) 2000  UNITS TABS    Take 2,000 Units by mouth daily.   CYANOCOBALAMIN  500 MCG TABLET    Take 1,000 mcg by mouth daily.   DEXTROMETHORPHAN -GUAIFENESIN  (MUCINEX  DM) 30-600 MG 12HR TABLET    Take 1 tablet by mouth 2 (two) times daily.   DILTIAZEM  (CARDIZEM  CD) 120 MG 24 HR CAPSULE    Take 120 mg by mouth daily.   EPIPEN  2-PAK 0.3 MG/0.3ML  SOAJ INJECTION    Inject 0.3 mg into the skin daily as needed (allergic reaction).    ESTRADIOL  (ESTRACE ) 0.5 MG TABLET    Take 0.5 mg by mouth daily.   EZETIMIBE  (ZETIA ) 10 MG TABLET    Take 10 mg by mouth at bedtime.   FLUOXETINE  (PROZAC ) 20 MG CAPSULE    Take 20 mg by mouth daily.   FLUTICASONE  (FLONASE ) 50 MCG/ACT NASAL SPRAY    Place 2 sprays into the nose daily.   FUROSEMIDE  (LASIX ) 20 MG TABLET    Take 1 tablet (20 mg total) by mouth daily.   GABAPENTIN  (NEURONTIN ) 100 MG CAPSULE    Take 100 mg by mouth daily.   INCLISIRAN (LEQVIO ) 284 MG/1.5ML SOSY INJECTION    Inject 284 mg into the skin once.   ISOSORBIDE  MONONITRATE (IMDUR ) 30 MG 24 HR TABLET    TAKE 1 TABLET(30 MG) BY MOUTH DAILY   METOPROLOL  TARTRATE (LOPRESSOR ) 25 MG TABLET    TAKE 1/2 TABLET(12.5 MG) BY MOUTH TWICE DAILY   MONTELUKAST  (SINGULAIR ) 10 MG TABLET    Take 10 mg by mouth at bedtime.   MULTIPLE VITAMIN (MULTIVITAMIN WITH MINERALS) TABS TABLET    Take 1 tablet by mouth daily.   NITROGLYCERIN  (NITROSTAT ) 0.4 MG SL TABLET    Place 1 tablet (0.4 mg total) under the tongue every 5 (five) minutes as needed for chest pain (up to 3 doses. If taking 3rd dose call 911).   OXYGEN     Inhale into the lungs.   PANTOPRAZOLE  (PROTONIX ) 40 MG TABLET    TAKE 2 TABLETS(80 MG) BY MOUTH DAILY   POTASSIUM CHLORIDE  (KLOR-CON ) 10 MEQ TABLET    Take 10 mEq by mouth See admin instructions. Take 1 tablet (10 mEq) by mouth on Monday's and Thursday's.   PROAIR  HFA 108 (90 BASE) MCG/ACT INHALER    Inhale 1-2 puffs into the lungs every 4 (four) hours as needed for wheezing.   ROSUVASTATIN  (CRESTOR ) 20 MG TABLET    Take 1 tablet (20 mg total) by mouth daily at 6 PM.  Modified Medications   No medications on file  Discontinued Medications   No medications on file    Allergies: Allergies  Allergen Reactions   Levofloxacin Anaphylaxis   Plavix  [Clopidogrel  Bisulfate] Rash   Azathioprine  Diarrhea and Other (See Comments)    Feeling weak as a  kitten   Ciprofibrate     Other reaction(s): swelling, muscle aches   Codeine Nausea Only   Drug Ingredient [Ticagrelor ] Other (See Comments)    Patient reports hemorrhage after medical trial of brilinta  in 2017   Nitrofurantoin  Other (See Comments)    VISION CHANGES   Ciprofloxacin Rash    Other Reaction(s): nausea and vomiting   Penicillins Rash   Sulfa Antibiotics Rash    Past Medical History: Past Medical History:  Diagnosis Date   Anemia    Anginal pain    Anxiety    Arthritis    feet, knees, back (05/02/2016)   Asthma    CAD (coronary artery disease)    a.  04/2016: NSTEMI 95% stenosis 1st Mrg (s/p DES)   Chest pain    a. 10/2013 Exercise Myoview : Ef 65%, no ischemia.   CHF (congestive heart failure) (HCC)    Chronic bronchitis (HCC)    Chronic lower back pain    COPD (chronic obstructive pulmonary disease) (HCC)    GERD (gastroesophageal reflux disease)    History of blood transfusion 1960s   related to my hysterectomy   Hypercholesterolemia    Hypertension    Lower extremity edema    Mild aortic stenosis 03/07/2023   TTE 02/28/2023: EF 55-60, no RWMA, mild asymmetric LVH, NL RVSF, mild MR, mild aortic stenosis (bulky subvalvular calcification), mean AV gradient 9.6, V-max 199 cm/s, RAP 3   Mitral valve prolapse    NSTEMI (non-ST elevated myocardial infarction) (HCC) 05/01/2016   Pneumonia 05/01/2016   saw trace of slight pneumonia/CT scan (05/02/2016)   Seasonal allergies    Sjogren's syndrome    Wheezing     Social History: Social History   Socioeconomic History   Marital status: Married    Spouse name: Not on file   Number of children: Not on file   Years of education: Not on file   Highest education level: Not on file  Occupational History   Not on file  Tobacco Use   Smoking status: Never    Passive exposure: Past   Smokeless tobacco: Never  Vaping Use   Vaping status: Never Used  Substance and Sexual Activity   Alcohol  use: No     Alcohol /week: 0.0 standard drinks of alcohol    Drug use: No   Sexual activity: Yes  Other Topics Concern   Not on file  Social History Narrative   Lives in [place] with husband.    Social Drivers of Corporate Investment Banker Strain: Not on file  Food Insecurity: No Food Insecurity (02/16/2024)   Hunger Vital Sign    Worried About Running Out of Food in the Last Year: Never true    Ran Out of Food in the Last Year: Never true  Transportation Needs: No Transportation Needs (02/16/2024)   PRAPARE - Administrator, Civil Service (Medical): No    Lack of Transportation (Non-Medical): No  Physical Activity: Not on file  Stress: Not on file  Social Connections: Moderately Integrated (02/08/2024)   Social Connection and Isolation Panel    Frequency of Communication with Friends and Family: More than three times a week    Frequency of Social Gatherings with Friends and Family: More than three times a week    Attends Religious Services: More than 4 times per year    Active Member of [employer] or Organizations: No    Attends Banker Meetings: Never    Marital Status: Married    Medication: Mycophenolate  mofetil 500mg  tablet   Patient was counseled on the purpose, proper use, and adverse effects of mycophenolate  including risk of infection, new or reactivation of viral infections, nausea, and headaches.  Given effects of food on bioavailability are minor, may administer with or without food. Due to GI-related side effects, may be best taken with food. Reviewed missed dose instructions - take missed dose as soon as you remember. If it is already time for the next dose, skip the missed dose and continue with usual dosing schedule. Do not take a double dose to make up for missed dose.  Discussed ways to prevent infections. Avoid sick contacts. Wear a mask if you feel appropriate and if  in crowded areas, such as airports. We reviewed that vaccines may be less effective while taking  CellCept  due to suppressed immune response, but vaccines still play an important role in preventing infections. Do not use live attenuated vaccines while taking CellCept .  CellCept  is an immunosuppressive agent, so you should HOLD CellCept  during an active infection, such as if you have a fever or if you are prescribed antibiotics.  Discussed warning of increased risk of certain malignancies, particularly of the skin.    Discussed risk of neutropenia and discussed importance of regular labs to monitor blood counts, liver function, and kidney function.   Patient has previously trialed CellCept  and called to report side effects. She re-evaluated with Dr. Geronimo at last OV on 09/18/24. She is agreeable to restart medication.  Plan: - START CellCept  500mg  BID x 14 days. Repeat labs on 10/07/24 at 11:00 AM at [facility]. Appointment booked. Pharmacy team will call patient with lab results and review next dose instructions. - She is taking pantoprazole . Advised to separate administration of pantoprazole  and CellCept  to avoid impact on CellCept  exposure.  - Patient has upcoming procedure for RHC on 10/17/24. She asks what she should do with CellCept . Advised to hold the day before procedure and day of procedure out of abundance of precaution. - Patient will reach out if she has any questions or concerns.  I discussed the assessment and treatment plan with the patient. The patient was provided an opportunity to ask questions and all were answered. The patient agreed with the plan and demonstrated an understanding of the instructions.   The patient was advised to call back or seek an in-person evaluation if the symptoms worsen or if the condition fails to improve as anticipated.  I provided 15 minutes of non-face-to-face time during this encounter.  Aleck Puls, PharmD, BCPS, CPP Clinical Pharmacist  [facility]

## 2024-09-20 NOTE — Assessment & Plan Note (Signed)
 Treated with rosuvastatin  and inclisiran.  Last lipids with an LDL of 19.

## 2024-09-20 NOTE — Assessment & Plan Note (Signed)
 Functional class II-III limitation likely related to her pulmonary fibrosis.  Reviewed pulmonology notes with Dr. Geronimo.  Recommend right heart catheterization to evaluate for pulmonary hypertension.  Reviewed risks, indications, and alternatives of right heart catheterization with the patient.  She understands risks of arrhythmia, vascular injury, cardiac injury, stroke, or MI, are extremely rare with right heart catheterization alone.

## 2024-09-20 NOTE — Progress Notes (Signed)
 Cardiology Office Note:    Date:  09/20/2024   ID:  Kristianne, Albin 10-Jun-1946, MRN [id number]  PCP:  Shayne Anes, MD   [facility] Providers Cardiologist:  Ozell Fell, MD     Referring MD: Shayne Anes, MD   Chief Complaint  Patient presents with   Shortness of Breath    History of Present Illness:    Donna Williamson is a 78 y.o. female with a hx of:  Coronary artery disease  Intol to Brilinta  (epistaxis); Allergy to Plavix   NSTEMI 04/2016 s/p DES to OM1 Myoview  3/21: low risk [place]  01/2021 s/p 2.5 x 16 mm DES to LAD and POBA of D2  Residual CAD on cath 02/17/21: pLAD 40, pLCx 40, OM1 stent patent Effient  Rx due to allergy to Brilinta  and Plavix   Myoview  02/08/2023: Normal perfusion, EF 51 (HFpEF) heart failure with preserved ejection fraction  TTE 02/18/2021: EF 60-65  TTE 02/28/2023: EF 55-60, no RWMA, mild asymmetric LVH, NL RVSF, mild MR, mild aortic stenosis (bulky subvalvular calcification), mean AV gradient 9.6, V-max 199 cm/s, RAP 3 Mild aortic stenosis  Parox Supraventricular Tachycardia  Monitor 06/2019: NSR avg HR 67, short SVT (8-10 beats), PVCs < 1% burden  Hypertension  Hyperlipidemia  Chronic kidney disease stage III Chronic Obstructive Pulmonary Disease Pulmonary fibrosis/ILD (Interstitial Lung Disease)  Pulm: Dr. Geronimo GERD Sjogren's syndrome  Sleep apnea  Carotid US  12/2013: Normal bilateral carotid arteries               She is here with her husband today. States that she has been very fatigued for the past 6 months. She was hospitalized in April for 6 days for hypoxic respiratory failure felt to be related to ILD flare versus multifocal pneumonia. She's on 2L of O2 at rest, and 4L O2 with activity.  She has occasional chest discomfort but this is rare and unchanged in frequency or severity.  No orthopnea or PND.  No recent problems with leg swelling.  States that she coughs up a lot of clear phlegm.  Current Medications: Current  Meds  Medication Sig   acetaminophen  (TYLENOL ) 500 MG tablet Take 500-1,000 mg by mouth every 6 (six) hours as needed for moderate pain (pain score 4-6).   albuterol  (PROVENTIL ) (2.5 MG/3ML) 0.083% nebulizer solution Take 3 mLs (2.5 mg total) by nebulization every 2 (two) hours as needed for wheezing or shortness of breath.   ALPRAZolam  (XANAX ) 0.5 MG tablet Take 0.5 mg by mouth daily as needed for anxiety.    aspirin  EC 81 MG tablet Take 81 mg by mouth daily.   carboxymethylcellulose (REFRESH PLUS) 0.5 % SOLN Place 1 drop into both eyes daily as needed (dry eyes).   Cholecalciferol  (VITAMIN D3) 2000 UNITS TABS Take 2,000 Units by mouth daily.   cyanocobalamin  500 MCG tablet Take 1,000 mcg by mouth daily.   EPIPEN  2-PAK 0.3 MG/0.3ML SOAJ injection Inject 0.3 mg into the skin daily as needed (allergic reaction).    ezetimibe  (ZETIA ) 10 MG tablet Take 10 mg by mouth at bedtime.   fluticasone  (FLONASE ) 50 MCG/ACT nasal spray Place 2 sprays into the nose daily.   furosemide  (LASIX ) 20 MG tablet Take 1 tablet (20 mg total) by mouth daily.   inclisiran (LEQVIO ) 284 MG/1.5ML SOSY injection Inject 284 mg into the skin once.   isosorbide  mononitrate (IMDUR ) 30 MG 24 hr tablet TAKE 1 TABLET(30 MG) BY MOUTH DAILY   metoprolol  tartrate (LOPRESSOR ) 25 MG tablet TAKE 1/2 TABLET(12.5 MG)  BY MOUTH TWICE DAILY   montelukast  (SINGULAIR ) 10 MG tablet Take 10 mg by mouth at bedtime.   Multiple Vitamin (MULTIVITAMIN WITH MINERALS) TABS tablet Take 1 tablet by mouth daily.   nitroGLYCERIN  (NITROSTAT ) 0.4 MG SL tablet Place 1 tablet (0.4 mg total) under the tongue every 5 (five) minutes as needed for chest pain (up to 3 doses. If taking 3rd dose call 911).   OXYGEN  Inhale into the lungs.   pantoprazole  (PROTONIX ) 40 MG tablet TAKE 2 TABLETS(80 MG) BY MOUTH DAILY   potassium chloride  (KLOR-CON ) 10 MEQ tablet Take 10 mEq by mouth See admin instructions. Take 1 tablet (10 mEq) by mouth on Monday's and Thursday's.    PROAIR  HFA 108 (90 BASE) MCG/ACT inhaler Inhale 1-2 puffs into the lungs every 4 (four) hours as needed for wheezing.   rosuvastatin  (CRESTOR ) 20 MG tablet Take 1 tablet (20 mg total) by mouth daily at 6 PM.     Allergies:   Levofloxacin, Plavix  [clopidogrel  bisulfate], Azathioprine , Ciprofibrate, Codeine, Drug ingredient [ticagrelor ], Nitrofurantoin , Ciprofloxacin, Penicillins, and Sulfa antibiotics   ROS:   Please see the history of present illness.    All other systems reviewed and are negative.  EKGs/Labs/Other Studies Reviewed:    The following studies were reviewed today: Cardiac Studies & Procedures   ______________________________________________________________________________________________ CARDIAC CATHETERIZATION  CARDIAC CATHETERIZATION 02/17/2021  Conclusion  Ost 2nd Diag to 2nd Diag lesion is 70% stenosed.  Previously placed 1st Mrg stent (unknown type) is widely patent.  Prox Cx to Mid Cx lesion is 40% stenosed.  Prox LAD to Mid LAD lesion is 40% stenosed.  2nd Diag lesion is 80% stenosed.  Mid LAD lesion is 80% stenosed.  Post intervention, there is a 80% residual stenosis.  A drug-eluting stent was successfully placed using a SYNERGY XD 2.50X16.  Post intervention, there is a 0% residual stenosis.  1. Moderate, calcified proximal LAD stenosis. 2. Severe mid LAD stenosis at the takeoff of the moderate caliber second Diagonal branch. RFR of the mid LAD lesion was 0.84 suggesting the lesion was flow limiting. The ostium of the moderate caliber second Diagonal branch had a severe stenosis. 3. Patent obtuse marginal stent 4. Non-dominant RCA with mild plaque 5. Successful balloon angioplasty of the ostium of the Diagonal branch. This was done prior to stenting of the LAD. TIMI-3 flow down the Diagonal branch post LAD stenting. 6. Successful PTCA/DES x 1 mid LAD. 7. Preserved LV systolic function.  Recommendations: Will continue DAPT with ASA and Plavix  for  at least six months. Continue statin. I would expect that she will be watched overnight and discharged home tomorrow.  Findings Coronary Findings Diagnostic  Dominance: Co-dominant  Left Anterior Descending Prox LAD to Mid LAD lesion is 40% stenosed. The lesion is calcified. Mid LAD lesion is 80% stenosed. Pressure wire/FFR was performed on the lesion. RFR 0.84  Second Diagonal Branch Vessel is moderate in size. Ost 2nd Diag to 2nd Diag lesion is 70% stenosed. 2nd Diag lesion is 80% stenosed.  Left Circumflex Prox Cx to Mid Cx lesion is 40% stenosed.  First Obtuse Marginal Branch Previously placed 1st Mrg stent (unknown type) is widely patent.  Right Coronary Artery Vessel is moderate in size. The vessel exhibits minimal luminal irregularities.  Intervention  Mid LAD lesion Stent CATH VISTA GUIDE 6FR XBLAD3.5 guide catheter was inserted. Lesion crossed with guidewire using a WIRE COUGAR XT STRL 190CM. Pre-stent angioplasty was performed using a BALLOON SAPPHIRE 2.0X12. A drug-eluting stent was successfully placed using  a SYNERGY XD 2.50X16. Stent strut is well apposed. Post-stent angioplasty was performed using a BALLOON SAPPHIRE [facility] [id number]. Post-Intervention Lesion Assessment The intervention was successful. Pre-interventional TIMI flow is 3. Post-intervention TIMI flow is 3. No complications occurred at this lesion. There is a 0% residual stenosis post intervention.  2nd Diag lesion Angioplasty Post-Intervention Lesion Assessment The intervention was successful. Pre-interventional TIMI flow is 3. Post-intervention TIMI flow is 3. No complications occurred at this lesion. There is a 80% residual stenosis post intervention.   CARDIAC CATHETERIZATION  CARDIAC CATHETERIZATION 05/02/2016  Conclusion  Prox LAD to Mid LAD lesion, 30% stenosed.  Mid LAD lesion, 50% stenosed at bifurcation of Ost 2nd Diag to 2nd Diag lesion, 70% stenosed.  1st Mrg-1 lesion, 95% stenosed. Post  intervention with a 2.5 x 16 Synergy drug eluting stent post dilated to 2.8 mm, there is a 0% residual stenosis.  Moderate pulmonary HTN.  Elevated LVEDP. CO 8.7 L/min. CI 3.96.  Continue dual antiplatelet therapy ideally for a year. She does have anemia but has had bleeding source is ruled out. Brilinta  started. She would be a candidate for TWILIGHT study.  She will need some diuresis over the next few days.  Findings Coronary Findings Diagnostic  Dominance: Co-dominant  Left Anterior Descending Calcified. located at the major branch.  Second Diagonal Branch  Left Circumflex  First Obtuse Marginal Branch Thrombotic.  Right Coronary Artery The vessel exhibits minimal luminal irregularities.  Intervention  1st Mrg-1 lesion PCI The pre-interventional distal flow is normal (TIMI 3). Pre-stent angioplasty was performed. A drug-eluting stent was placed. The strut is apposed. Post-stent angioplasty was performed. The post-interventional distal flow is normal (TIMI 3). The intervention was successful. No complications occurred at this lesion. Supplies used: BALLOON EMERGE MR 2.5X15; STENT SYNERGY DES 2.5X16; BALLOON Yeager EMERGE MR [id number] There is a 0% residual stenosis post intervention.   STRESS TESTS  MYOCARDIAL PERFUSION IMAGING 02/08/2023  Interpretation Summary   The study is normal. The study is low risk.   No ST deviation was noted.   LV perfusion is normal.   Left ventricular function is abnormal. Global function is mildly reduced. Nuclear stress EF: 51 %. The left ventricular ejection fraction is mildly decreased (45-54%). End diastolic cavity size is normal.   Prior study available for comparison from 01/20/2020. No changes compared to prior study.  Low risk stress nuclear study with normal perfusion. Borderline reduced left ventricular global systolic function, similar to the previous study.   ECHOCARDIOGRAM  ECHOCARDIOGRAM COMPLETE  06/17/2024  Narrative ECHOCARDIOGRAM REPORT    Patient Name:   Donna Williamson Date of Exam: 06/17/2024 Medical Rec #:  [id number]         Height:       65.0 in Accession #:    [id number]        Weight:       211.0 lb Date of Birth:  Jun 12, 1946         BSA:          2.024 m Patient Age:    78 years          BP:           108/60 mmHg Patient Gender: F                 HR:           72 bpm. Exam Location:  Outpatient  Procedure: 3D Echo, 2D Echo, Color Doppler and Cardiac Doppler (Both Spectral and Color Flow  Doppler were utilized during procedure).  Indications:    Dyspnea  History:        Patient has prior history of Echocardiogram examinations, most recent 02/28/2023. Previous Myocardial Infarction, Arrythmias:PVC, Signs/Symptoms:Chest Pain; Risk Factors:Non-Smoker, Dyslipidemia and Hypertension.  Sonographer:    Orvil Holmes RDCS Referring Phys: 79 MURALI RAMASWAMY  IMPRESSIONS   1. Left ventricular ejection fraction, by estimation, is 55 to 60%. The left ventricle has normal function. Left ventricular diastolic parameters are indeterminate. 2. Right ventricular systolic function is low normal. The right ventricular size is normal. 3. Trivial mitral valve regurgitation. 4. AV is thickend, calcified Difficult to see individual leaflets well Peak and mean gradients through the valve are 15 and 8 mm Hg. Probable mild AS. Further estimation of valve area limited by data for LVOT.SABRA The aortic valve has an indeterminant number of cusps. Aortic valve regurgitation is not visualized. 5. The inferior vena cava is normal in size with greater than 50% respiratory variability, suggesting right atrial pressure of 3 mmHg.  FINDINGS Left Ventricle: Left ventricular ejection fraction, by estimation, is 55 to 60%. The left ventricle has normal function. Global longitudinal strain performed but not reported based on interpreter judgement due to suboptimal tracking. 3D ejection fraction  reviewed and evaluated as part of the interpretation. Alternate measurement of EF is felt to be most reflective of LV function. The left ventricular internal cavity size was normal in size. There is no left ventricular hypertrophy. Left ventricular diastolic parameters are indeterminate.  Right Ventricle: The right ventricular size is normal. Right vetricular wall thickness was not assessed. Right ventricular systolic function is low normal.  Left Atrium: Left atrial size was normal in size.  Right Atrium: Right atrial size was normal in size.  Pericardium: There is no evidence of pericardial effusion.  Mitral Valve: There is mild thickening of the mitral valve leaflet(s). Mild to moderate mitral annular calcification. Trivial mitral valve regurgitation.  Tricuspid Valve: The tricuspid valve is normal in structure. Tricuspid valve regurgitation is trivial.  Aortic Valve: AV is thickend, calcified Difficult to see individual leaflets well Peak and mean gradients through the valve are 15 and 8 mm Hg. Probable mild AS. Further estimation of valve area limited by data for LVOT. The aortic valve has an indeterminant number of cusps. Aortic valve regurgitation is not visualized. Aortic valve mean gradient measures 8.0 mmHg. Aortic valve peak gradient measures 15.3 mmHg. Aortic valve area, by VTI measures 1.56 cm.  Pulmonic Valve: The pulmonic valve was not well visualized. Pulmonic valve regurgitation is not visualized.  Aorta: The aortic root and ascending aorta are structurally normal, with no evidence of dilitation.  Venous: The inferior vena cava is normal in size with greater than 50% respiratory variability, suggesting right atrial pressure of 3 mmHg.  IAS/Shunts: No atrial level shunt detected by color flow Doppler.   LEFT VENTRICLE PLAX 2D LVIDd:         4.68 cm   Diastology LVIDs:         3.30 cm   LV e' medial:    5.87 cm/s LV PW:         1.03 cm   LV E/e' medial:  15.1 LV IVS:         1.15 cm   LV e' lateral:   7.51 cm/s LVOT diam:     2.20 cm   LV E/e' lateral: 11.8 LV SV:         62 LV SV Index:   30 LVOT  Area:     3.80 cm  3D Volume EF: 3D EF:        50 % LV EDV:       137 ml LV ESV:       69 ml LV SV:        68 ml  RIGHT VENTRICLE RV Basal diam:  3.98 cm RV Mid diam:    3.25 cm RV S prime:     14.40 cm/s TAPSE (M-mode): 3.4 cm  LEFT ATRIUM           Index        RIGHT ATRIUM           Index LA diam:      4.20 cm 2.08 cm/m   RA Area:     13.70 cm LA Vol (A2C): 64.6 ml 31.92 ml/m  RA Volume:   29.70 ml  14.68 ml/m LA Vol (A4C): 55.3 ml 27.33 ml/m AORTIC VALVE AV Area (Vmax):    1.44 cm AV Area (Vmean):   1.45 cm AV Area (VTI):     1.56 cm AV Vmax:           195.50 cm/s AV Vmean:          131.000 cm/s AV VTI:            0.396 m AV Peak Grad:      15.3 mmHg AV Mean Grad:      8.0 mmHg LVOT Vmax:         73.90 cm/s LVOT Vmean:        49.800 cm/s LVOT VTI:          0.162 m LVOT/AV VTI ratio: 0.41  AORTA Ao Root diam: 2.80 cm Ao Asc diam:  2.90 cm  MITRAL VALVE MV Area (PHT): 3.21 cm     SHUNTS MV Decel Time: 236 msec     Systemic VTI:  0.16 m MV E velocity: 88.60 cm/s   Systemic Diam: 2.20 cm MV A velocity: 102.00 cm/s MV E/A ratio:  0.87  Vina Gull MD Electronically signed by Vina Gull MD Signature Date/Time: 06/17/2024/11:04:29 PM    Final    MONITORS  LONG TERM MONITOR (3-14 DAYS) 06/13/2019  Narrative 1) The basic rhythm is normal sinus with an average HR of 67 bpm 2) Occasional short runs of SVT 8-10 beats 3) Occasional PVC's with burden < 1% 4) No sustained arrhythmia, bradycardic events, or pathologic pauses       ______________________________________________________________________________________________      EKG:        Recent Labs: 02/12/2024: Magnesium  2.3 04/03/2024: B Natriuretic Peptide 54.4 05/22/2024: Pro B Natriuretic peptide (BNP) 337.0 07/15/2024: ALT 11; BUN 10; Creatinine, Ser 1.11;  Hemoglobin 10.7; Platelets 302.0; Potassium 3.6; Sodium 139  Recent Lipid Panel    Component Value Date/Time   CHOL 131 02/17/2021 0154   TRIG 132 02/17/2021 0154   HDL 46 02/17/2021 0154   CHOLHDL 2.8 02/17/2021 0154   VLDL 26 02/17/2021 0154   LDLCALC 59 02/17/2021 0154     Risk Assessment/Calculations:           STOP-Bang Score:          Physical Exam:    VS:  BP 130/76 (BP Location: Right Arm, Patient Position: Sitting, Cuff Size: Normal)   Pulse 68   Ht 5' 5 (1.651 m)   Wt 196 lb 3.2 oz (89 kg)   SpO2 94%   BMI 32.65 kg/m     Wt  Readings from Last 3 Encounters:  09/20/24 196 lb 3.2 oz (89 kg)  09/18/24 197 lb 3.2 oz (89.4 kg)  08/26/24 201 lb 4.5 oz (91.3 kg)     GEN:  Well nourished, well developed in no acute distress HEENT: Normal NECK: No JVD; No carotid bruits LYMPHATICS: No lymphadenopathy CARDIAC: RRR, 2/6 early peaking ejection murmur at the right upper sternal border RESPIRATORY: Scattered rales with good air movement ABDOMEN: Soft, non-tender, non-distended MUSCULOSKELETAL:  No edema; No deformity  SKIN: Warm and dry NEUROLOGIC:  Alert and oriented x 3 PSYCHIATRIC:  Normal affect   Assessment & Plan Coronary artery disease involving native coronary artery of native heart with angina pectoris Clinically stable with rare episodes of angina.  Continue aspirin , isosorbide , and metoprolol  at current doses. Mild aortic stenosis Echocardiogram August 2025 shows normal LVEF 55 to 60%, low normal RV function, thickened aortic valve with peak and mean gradients of 15 and 8 mmHg, respectively with probable mild aortic stenosis.  Continue observation. Chronic heart failure with preserved ejection fraction (HCC) Functional class II-III limitation likely related to her pulmonary fibrosis.  Reviewed pulmonology notes with Dr. Geronimo.  Recommend right heart catheterization to evaluate for pulmonary hypertension.  Reviewed risks, indications, and alternatives  of right heart catheterization with the patient.  She understands risks of arrhythmia, vascular injury, cardiac injury, stroke, or MI, are extremely rare with right heart catheterization alone. Hyperlipidemia LDL goal <70 Treated with rosuvastatin  and inclisiran.  Last lipids with an LDL of 19.       Informed Consent   Shared Decision Making/Informed Consent The risks, including but not limited to, [bleeding or vascular complications (1 in 500), pneumothorax (1 in 1600), arrhythmia (1 in 1000) and death (1 in 5000)], benefits (diagnostic support and/or management of heart failure, pulmonary hypertension) and alternatives of a right heart catheterization were discussed in detail with Ms. Sabina and she is willing to proceed.       Medication Adjustments/Labs and Tests Ordered: Current medicines are reviewed at length with the patient today.  Concerns regarding medicines are outlined above.  Orders Placed This Encounter  Procedures   Basic metabolic panel with GFR   CBC   No orders of the defined types were placed in this encounter.   Patient Instructions  Medication Instructions:  No medication changes were made at this visit. Continue current regimen.   *If you need a refill on your cardiac medications before your next appointment, please call your pharmacy*  Lab Work: To be completed today: BMP and CBC  If you have labs (blood work) drawn today and your tests are completely normal, you will receive your results only by: MyChart Message (if you have MyChart) OR A paper copy in the mail If you have any lab test that is abnormal or we need to change your treatment, we will call you to review the results.  Testing/Procedures: Your physician has requested that you have a cardiac catheterization. Cardiac catheterization is used to diagnose and/or treat various heart conditions. Doctors may recommend this procedure for a number of different reasons. The most common reason is to  evaluate chest pain. Chest pain can be a symptom of coronary artery disease (CAD), and cardiac catheterization can show whether plaque is narrowing or blocking your heart's arteries. This procedure is also used to evaluate the valves, as well as measure the blood flow and oxygen  levels in different parts of your heart. For further information please visit [email or web address] Please follow instruction sheet, as  given.   Follow-Up: At [facility], you and your health needs are our priority.  As part of our continuing mission to provide you with exceptional heart care, our providers are all part of one team.  This team includes your primary Cardiologist (physician) and Advanced Practice Providers or APPs (Physician Assistants and Nurse Practitioners) who all work together to provide you with the care you need, when you need it.  Your next appointment:   1 year(s)  Provider:   Ozell Fell, MD    We recommend signing up for the patient portal called MyChart.  Sign up information is provided on this After Visit Summary.  MyChart is used to connect with patients for Virtual Visits (Telemedicine).  Patients are able to view lab/test results, encounter notes, upcoming appointments, etc.  Non-urgent messages can be sent to your provider as well.   To learn more about what you can do with MyChart, go to [email or web address].   Other Instructions       Cardiac/Peripheral Catheterization   You are scheduled for a Cardiac Catheterization on Thursday, December 18 with Dr. Ozell Fell.  1. Please arrive at the [facility] (Main Entrance A) at [facility]: [address] [place], [place] [address] at 9:00 AM (This time is 2 hour(s) before your procedure to ensure your preparation). Your procedure is scheduled to begin at 11 AM.  Free valet parking service is available. You will check in at ADMITTING. The support person will be asked to wait in the waiting room.  It is OK to  have someone drop you off and come back when you are ready to be discharged.        Special note: Every effort is made to have your procedure done on time. Please understand that emergencies sometimes delay scheduled procedures.  2. Diet: Nothing to eat after midnight.  3. Hydration:You need to be well hydrated before your procedure. On December 18, you may drink approved liquids (see below) until 2 hours before the procedure, with 16 oz of water as your last intake.   List of approved liquids water, clear juice, clear tea, black coffee, fruit juices, non-citric and without pulp, carbonated beverages, Gatorade, Kool -Aid, plain Jello-O and plain ice popsicles.  4. Labs: You will need to have blood drawn on Friday, November 21 at [facility] (1st Floor), [address] [place], [place] [address]. You do not need to be fasting.  5. Medication instructions in preparation for your procedure:   Contrast Allergy: No  Stop taking, Lasix  (Furosemide )  Wednesday, December 17,, Isosorbide  Mononitrate (Imdur ) Wednesday, December 17, your last dose of these two medications will be on Tuesday, December 16.   On the morning of your procedure, take Aspirin  81 mg and any morning medicines NOT listed above.  You may use sips of water.  6. Plan to go home the same day, you will only stay overnight if medically necessary. 7. You MUST have a responsible adult to drive you home. 8. An adult MUST be with you the first 24 hours after you arrive home. 9. Bring a current list of your medications, and the last time and date medication taken. 10. Bring ID and current insurance cards. 11.Please wear clothes that are easy to get on and off and wear slip-on shoes.  Thank you for allowing us  to care for you!   -- [facility]     Signed, Ozell  Wonda, MD  09/20/2024 1:04 PM     [facility]

## 2024-09-20 NOTE — Assessment & Plan Note (Signed)
 Echocardiogram August 2025 shows normal LVEF 55 to 60%, low normal RV function, thickened aortic valve with peak and mean gradients of 15 and 8 mmHg, respectively with probable mild aortic stenosis.  Continue observation.

## 2024-09-22 DIAGNOSIS — I493 Ventricular premature depolarization: Secondary | ICD-10-CM

## 2024-09-24 ENCOUNTER — Encounter (HOSPITAL_COMMUNITY)

## 2024-10-01 ENCOUNTER — Encounter (HOSPITAL_COMMUNITY)

## 2024-10-03 ENCOUNTER — Encounter (HOSPITAL_COMMUNITY)

## 2024-10-03 ENCOUNTER — Ambulatory Visit: Admitting: Internal Medicine

## 2024-10-03 DIAGNOSIS — H16223 Keratoconjunctivitis sicca, not specified as Sjogren's, bilateral: Secondary | ICD-10-CM | POA: Diagnosis not present

## 2024-10-03 DIAGNOSIS — H18593 Other hereditary corneal dystrophies, bilateral: Secondary | ICD-10-CM | POA: Diagnosis not present

## 2024-10-03 DIAGNOSIS — H169 Unspecified keratitis: Secondary | ICD-10-CM | POA: Diagnosis not present

## 2024-10-07 ENCOUNTER — Other Ambulatory Visit (INDEPENDENT_AMBULATORY_CARE_PROVIDER_SITE_OTHER)

## 2024-10-07 ENCOUNTER — Telehealth: Payer: Self-pay | Admitting: *Deleted

## 2024-10-07 ENCOUNTER — Other Ambulatory Visit: Payer: Self-pay

## 2024-10-07 DIAGNOSIS — J8489 Other specified interstitial pulmonary diseases: Secondary | ICD-10-CM | POA: Diagnosis not present

## 2024-10-07 DIAGNOSIS — Z79899 Other long term (current) drug therapy: Secondary | ICD-10-CM | POA: Diagnosis not present

## 2024-10-07 DIAGNOSIS — M3502 Sicca syndrome with lung involvement: Secondary | ICD-10-CM | POA: Diagnosis not present

## 2024-10-07 DIAGNOSIS — M359 Systemic involvement of connective tissue, unspecified: Secondary | ICD-10-CM

## 2024-10-07 DIAGNOSIS — R7989 Other specified abnormal findings of blood chemistry: Secondary | ICD-10-CM | POA: Diagnosis not present

## 2024-10-07 DIAGNOSIS — Z8679 Personal history of other diseases of the circulatory system: Secondary | ICD-10-CM | POA: Diagnosis not present

## 2024-10-07 DIAGNOSIS — Z8709 Personal history of other diseases of the respiratory system: Secondary | ICD-10-CM

## 2024-10-07 DIAGNOSIS — Z09 Encounter for follow-up examination after completed treatment for conditions other than malignant neoplasm: Secondary | ICD-10-CM

## 2024-10-07 LAB — COMPREHENSIVE METABOLIC PANEL WITH GFR
ALT: 12 U/L (ref 0–35)
AST: 25 U/L (ref 0–37)
Albumin: 3.8 g/dL (ref 3.5–5.2)
Alkaline Phosphatase: 75 U/L (ref 39–117)
BUN: 19 mg/dL (ref 6–23)
CO2: 25 meq/L (ref 19–32)
Calcium: 9.1 mg/dL (ref 8.4–10.5)
Chloride: 106 meq/L (ref 96–112)
Creatinine, Ser: 1.04 mg/dL (ref 0.40–1.20)
GFR: 50.66 mL/min — ABNORMAL LOW (ref >60.00)
Glucose, Bld: 106 mg/dL — ABNORMAL HIGH (ref 70–99)
Potassium: 3.8 meq/L (ref 3.5–5.1)
Sodium: 141 meq/L (ref 135–145)
Total Bilirubin: 0.3 mg/dL (ref 0.2–1.2)
Total Protein: 6.2 g/dL (ref 6.0–8.3)

## 2024-10-07 LAB — SEDIMENTATION RATE: Sed Rate: 55 mm/h — ABNORMAL HIGH (ref 0–30)

## 2024-10-07 LAB — CBC
HCT: 34 % — ABNORMAL LOW (ref 36.0–46.0)
Hemoglobin: 11.2 g/dL — ABNORMAL LOW (ref 12.0–15.0)
MCHC: 33 g/dL (ref 30.0–36.0)
MCV: 96.9 fl (ref 78.0–100.0)
Platelets: 269 K/uL (ref 150.0–400.0)
RBC: 3.51 Mil/uL — ABNORMAL LOW (ref 3.87–5.11)
RDW: 14.9 % (ref 11.5–15.5)
WBC: 6.7 K/uL (ref 4.0–10.5)

## 2024-10-07 LAB — BRAIN NATRIURETIC PEPTIDE: Pro B Natriuretic peptide (BNP): 396 pg/mL — ABNORMAL HIGH (ref 0.0–100.0)

## 2024-10-07 NOTE — Telephone Encounter (Signed)
 Copied from CRM [id number]. Topic: Clinical - Medication Question >> Oct 04, 2024 11:10 AM Corean SAUNDERS wrote: Reason for CRM: Question for [facility]: Patient was seen by eye doctor yesterday and wants confirm it is okay for to take the medication prescribed by eye doctor with her other prescribed medications.  Eye medication name: moxifloxacin 0.5 (antibiotic drop)   Please call patient back to advise.

## 2024-10-07 NOTE — Patient Outreach (Signed)
 Complex Care Management   Visit Note  10/07/2024  Name:  Donna Williamson MRN: [id number] DOB: [birth year]  Situation: Referral received for Complex Care Management related to Pulmonary Disease I obtained verbal consent from Patient.  Visit completed with Patient  on the phone  Background:   Past Medical History:  Diagnosis Date   Anemia    Anginal pain    Anxiety    Arthritis    feet, knees, back (05/02/2016)   Asthma    CAD (coronary artery disease)    a. 04/2016: NSTEMI 95% stenosis 1st Mrg (s/p DES)   Chest pain    a. 10/2013 Exercise Myoview : Ef 65%, no ischemia.   CHF (congestive heart failure) (HCC)    Chronic bronchitis (HCC)    Chronic lower back pain    COPD (chronic obstructive pulmonary disease) (HCC)    GERD (gastroesophageal reflux disease)    History of blood transfusion 1960s   related to my hysterectomy   Hypercholesterolemia    Hypertension    Lower extremity edema    Mild aortic stenosis 03/07/2023   TTE 02/28/2023: EF 55-60, no RWMA, mild asymmetric LVH, NL RVSF, mild MR, mild aortic stenosis (bulky subvalvular calcification), mean AV gradient 9.6, V-max 199 cm/s, RAP 3   Mitral valve prolapse    NSTEMI (non-ST elevated myocardial infarction) (HCC) 05/01/2016   Pneumonia 05/01/2016   saw trace of slight pneumonia/CT scan (05/02/2016)   Seasonal allergies    Sjogren's syndrome    Wheezing     Assessment: Patient reports she continues to attend provider appointments as scheduled. Patient has an upcoming cardiac cath scheduled for 10/17/24 and will continue to follow up with pulmonology. Patient Reported Symptoms:  Cognitive Cognitive Status: No symptoms reported      Neurological Neurological Review of Symptoms: No symptoms reported    HEENT HEENT Symptoms Reported: No symptoms reported      Cardiovascular Cardiovascular Symptoms Reported: Palpitations Other Cardiovascular Symptoms: patient reports flutters chronic. cardiologist is aware.  patient last seen by cardiology on 09/20/24. No medication changes. upcoming cardiac cath to evaluate for pulmonary HTN. Does patient have uncontrolled Hypertension?: No Cardiovascular Management Strategies: Medication therapy Weight: 193 lb (87.5 kg) Cardiovascular Comment: Patient reports she checks weight daily. and BP ranges 120-135/70's.  Respiratory Respiratory Symptoms Reported: Shortness of breath Other Respiratory Symptoms: continues with SOB with activity. when resting does not need O2. wears O2 at 2l/[place] at night and 4L/[place] with activity. pulmonologist last seen on 09/18/24. patient denies the need for prn inhaler use.    Endocrine Endocrine Symptoms Reported: No symptoms reported Is patient diabetic?: No    Gastrointestinal Gastrointestinal Symptoms Reported: No symptoms reported      Genitourinary Genitourinary Symptoms Reported: No symptoms reported    Integumentary Integumentary Symptoms Reported: No symptoms reported    Musculoskeletal Musculoskelatal Symptoms Reviewed: Limited mobility Additional Musculoskeletal Details: ambulates with cane due to needs both knees replaced. reports uses ointment, rest.        Psychosocial Psychosocial Symptoms Reported: No symptoms reported, Anxiety - if selected complete GAD Additional Psychological Details: working with a therapist for group grief counseling and outside counseling re: loss of her mother.            10/07/2024    3:04 PM  GAD 7 : Generalized Anxiety Score  Nervous, Anxious, on Edge 1  Control/stop worrying 1  Worry too much - different things 0  Trouble relaxing 0  Restless 0  Easily annoyed or irritable 1  Afraid - awful might happen 0  Total GAD 7 Score 3  Anxiety Difficulty Somewhat difficult   Today's Vitals   10/07/24 1502  Weight: 193 lb (87.5 kg)   Pain Score: 0-No pain  Medications Reviewed Today     Reviewed by Garyson Stelly M, RN (Registered Nurse) on 10/07/24 at 1456  Med List Status:  <None>   Medication Order Taking? Sig Documenting Provider Last Dose Status Informant  acetaminophen  (TYLENOL ) 500 MG tablet [id number] Yes Take 500-1,000 mg by mouth every 6 (six) hours as needed for moderate pain (pain score 4-6). [provider]  Active Self, Pharmacy Records  albuterol  (PROVENTIL ) (2.5 MG/3ML) 0.083% nebulizer solution [id number] Yes Take 3 mLs (2.5 mg total) by nebulization every 2 (two) hours as needed for wheezing or shortness of breath. Akula, Vijaya, MD  Active Self, Pharmacy Records           Med Note MACK, IZETTA DEL   Fri Jan 10, 2020  3:15 PM)    ALPRAZolam  (XANAX ) 0.5 MG tablet [id number] Yes Take 0.5 mg by mouth daily as needed for anxiety.  [provider]  Active Self, Pharmacy Records           Med Note ODETTA, [facility] A   Mon Aug 14, 2017  9:55 AM)    aspirin  EC 81 MG tablet [id number] Yes Take 81 mg by mouth daily. [provider]  Active Self, Pharmacy Records  budesonide-formoterol  [facility]) 160-4.5 MCG/ACT inhaler [id number]  Inhale 2 puffs into the lungs 2 (two) times daily.  Patient not taking: Reported on 09/18/2024   [provider]  Active Self, Pharmacy Records           Med Note JACKOLYN, [place] R   Fri Sep 20, 2024 10:39 AM)    carboxymethylcellulose (REFRESH PLUS) 0.5 % SOLN [id number] Yes Place 1 drop into both eyes daily as needed (dry eyes). [provider]  Active Self, Pharmacy Records  Cholecalciferol  (VITAMIN D3) 2000 UNITS TABS [id number] Yes Take 2,000 Units by mouth daily. [provider]  Active Self, Pharmacy Records  cyanocobalamin  500 MCG tablet [id number] Yes Take 1,000 mcg by mouth daily. [provider]  Active Self, Pharmacy Records  diltiazem  (CARDIZEM  CD) 120 MG 24 hr capsule [id number]  Take 120 mg by mouth daily.  Patient not taking: Reported on 09/18/2024   [provider]  Active Self, Pharmacy Records  EPIPEN  2-PAK 0.3 MG/0.3ML SOAJ injection [id number] Yes  Inject 0.3 mg into the skin daily as needed (allergic reaction).  [provider]  Active Self, Pharmacy Records           Med Note CRAIG, [facility] V   Fri Nov 29, 2013  1:48 PM)     estradiol  (ESTRACE ) 0.5 MG tablet [id number]  Take 0.5 mg by mouth daily.  Patient not taking: Reported on 09/18/2024   [provider]  Active Self, Pharmacy Records  ezetimibe  (ZETIA ) 10 MG tablet [id number] Yes Take 10 mg by mouth at bedtime. [provider]  Active Self, Pharmacy Records  FLUoxetine  (PROZAC ) 20 MG capsule [id number]  Take 20 mg by mouth daily. [provider]  Active Self, Pharmacy Records  fluticasone  (FLONASE ) 50 MCG/ACT nasal spray [id number] Yes Place 2 sprays into the nose daily. [provider]  Active Self, Pharmacy Records  furosemide  (LASIX ) 20 MG tablet [id number] Yes Take 1 tablet (20 mg total) by mouth daily. Wonda Sharper, MD  Active Self, Pharmacy Records  gabapentin  (NEURONTIN ) 100 MG  capsule [id number]  Take 100 mg by mouth daily. [provider]  Active   inclisiran (LEQVIO ) 284 MG/1.5ML SOSY injection [id number] Yes Inject 284 mg into the skin once. [provider]  Active Self, Pharmacy Records           Med Note JACKOLYN, [place] R   Fri Sep 20, 2024 10:40 AM)    isosorbide  mononitrate (IMDUR ) 30 MG 24 hr tablet [id number] Yes TAKE 1 TABLET(30 MG) BY MOUTH DAILY Wonda Sharper, MD  Active   metoprolol  tartrate (LOPRESSOR ) 25 MG tablet [id number] Yes TAKE 1/2 TABLET(12.5 MG) BY MOUTH TWICE DAILY Wonda Sharper, MD  Active   montelukast  (SINGULAIR ) 10 MG tablet [id number] Yes Take 10 mg by mouth at bedtime. [provider]  Active Self, Pharmacy Records  moxifloxacin (VIGAMOX) 0.5 % ophthalmic solution [id number] Yes 1 drop 3 (three) times daily. [provider]  Active   Multiple Vitamin (MULTIVITAMIN WITH MINERALS) TABS tablet [id number] Yes Take 1 tablet by mouth daily. [provider]  Active  Self, Pharmacy Records  mycophenolate  (CELLCEPT ) 500 MG tablet [id number] Yes Take 1 tablet (500 mg total) by mouth 2 (two) times daily. Repeat labs in 2 weeks. Geronimo Amel, MD  Active   nitroGLYCERIN  (NITROSTAT ) 0.4 MG SL tablet [id number] Yes Place 1 tablet (0.4 mg total) under the tongue every 5 (five) minutes as needed for chest pain (up to 3 doses. If taking 3rd dose call 911). Lelon Glendia ONEIDA DEVONNA  Active Self, Pharmacy Records           Med Note STEPHEN, [facility]   Tue Jan 31, 2023  3:17 PM)    OXYGEN  [id number] Yes Inhale into the lungs. [provider]  Active   pantoprazole  (PROTONIX ) 40 MG tablet [id number] Yes TAKE 2 TABLETS(80 MG) BY MOUTH DAILY Wonda Sharper, MD  Active   potassium chloride  (KLOR-CON ) 10 MEQ tablet [id number] Yes Take 10 mEq by mouth See admin instructions. Take 1 tablet (10 mEq) by mouth on Monday's and Thursday's. [provider]  Active Self, Pharmacy Records  PROAIR  HFA 108 (90 BASE) MCG/ACT inhaler [id number] Yes Inhale 1-2 puffs into the lungs every 4 (four) hours as needed for wheezing. [provider]  Active Self, Pharmacy Records           Med Note CRAIG, SUZEN V   Fri Nov 29, 2013  1:48 PM)    rosuvastatin  (CRESTOR ) 20 MG tablet [id number] Yes Take 1 tablet (20 mg total) by mouth daily at 6 PM. Jerrie Anger, PA  Active Self, Pharmacy Records          Recommendation:   Continue Current Plan of Care  Follow Up Plan:   Telephone follow up appointment date/time:  11/06/24 at 3:00 pm  Heddy Shutter, RN, MSN, BSN, CCM [place]  [facility], Population Health Case Manager Phone: [phone]

## 2024-10-07 NOTE — Telephone Encounter (Signed)
 Returned call to patient. No drug interactions identified with other medications. She verbalizes understanding. She has been taking moxifloxacin eye drops since 10/04/24. Of note, allergy documented to levofloxacin (anaphylaxis) and ciprofloxacin (rash, N/V). She denies side effects thus far on moxifloxacin therapy. Encouraged to discuss with eye doctor as there is risk of cross-reactivity and systemic absorption, but she reports she is tolerating eye drops well. She verbalizes understanding and agreement with plan.

## 2024-10-07 NOTE — Patient Instructions (Signed)
 Visit Information  Thank you for taking time to visit with me today. Please don't hesitate to contact me if I can be of assistance to you before our next scheduled appointment.  Your next care management appointment is by telephone on 11/06/24 at 3:00 pm  Please call the care guide team at [phone] if you need to cancel, schedule, or reschedule an appointment.   Please call the Suicide and Crisis Lifeline: 988 call the [place]  National Suicide Prevention Lifeline: (321) 284-1090 or TTY: 731-677-7744 TTY (859)174-6068) to talk to a trained counselor if you are experiencing a Mental Health or Behavioral Health Crisis or need someone to talk to.  Heddy Shutter, RN, MSN, BSN, CCM [place]  [facility], Population Health Case Manager Phone: [phone]

## 2024-10-08 ENCOUNTER — Encounter (HOSPITAL_COMMUNITY)

## 2024-10-08 DIAGNOSIS — Z1231 Encounter for screening mammogram for malignant neoplasm of breast: Secondary | ICD-10-CM | POA: Diagnosis not present

## 2024-10-09 NOTE — Progress Notes (Signed)
 CBC/CMP stable. Called patient to alert her that labs are stable.   From Dr. Reeves last note on 09/18/24, restart CellCept  at 500mg  BID. Will confirm with Dr. Geronimo prior to further titration. Advised patient to continue current dose for now.

## 2024-10-10 ENCOUNTER — Encounter (HOSPITAL_COMMUNITY)

## 2024-10-10 NOTE — Progress Notes (Signed)
 Discussed with Dr. Geronimo via secure chat - plan to continue CellCept  500mg  BID for now, then trial dose increase in the new year 2026, between January 1st and 15th. She has an OV with Izetta Rouleau, NP, on 11/01/24. Patient verbalizes understanding and agreement with plan.

## 2024-10-11 ENCOUNTER — Ambulatory Visit: Admitting: Internal Medicine

## 2024-10-15 ENCOUNTER — Encounter (HOSPITAL_COMMUNITY)

## 2024-10-15 ENCOUNTER — Telehealth: Payer: Self-pay | Admitting: *Deleted

## 2024-10-15 NOTE — Telephone Encounter (Signed)
 Copied from CRM #[id number]. Topic: Clinical - Medication Question >> Oct 14, 2024 10:56 AM Ismael A wrote: Reason for CRM: pt requesting a call back from [facility] - forgot to set up lab appt for bloodwork, wants to go over instructions for mycophenolate  (CELLCEPT ) 500 MG tablet since she is having heart catherization on 10/17/24  Please advise.

## 2024-10-15 NOTE — Telephone Encounter (Signed)
 Right Heart Cath scheduled at [facility] for: Thursday October 17, 2024 10:30 AM Arrival time [facility] Main Entrance A at: 8:30 AM  Diet: -Nothing to eat after midnight.  Hydration: -May drink clear liquids until 2 hours before the procedure.  Approved liquids: Water, clear tea, black coffee, fruit juices-non-citric and without pulp,Gatorade, plain Jello/popsicles   Medication instructions: -Hold:  Lasix /KCl-AM of procedure  -Other usual morning medications can be taken including aspirin  81 mg.  Plan to go home the same day, you will only stay overnight if medically necessary.  You must have responsible adult to drive you home.  Someone must be with you the first 24 hours after you arrive home.  Reviewed procedure instructions with patient.

## 2024-10-16 MED ORDER — MYCOPHENOLATE MOFETIL 500 MG PO TABS: 500.0000 mg | ORAL_TABLET | Freq: Two times a day (BID) | ORAL | 1 refills | Status: DC

## 2024-10-16 NOTE — Telephone Encounter (Signed)
 ATC patient to discuss. Left HIPAA compliant VM explaining she is up to date on labs until January, and she can continue the medication she referenced. I gave my contact number to return call if she has questions about details.   She is up to date on lab work - see addendum on 09/20/24 office visit note. She is taking CellCept  500mg  BID and labs were stable when repeated on 10/07/24.   Per 10/15/24 telephone procedure instructions, okay to continue her other medications. She was told to hold her diuretic.   Aleck Puls, PharmD, BCPS, CPP Clinical Pharmacist  [facility]  [facility]

## 2024-10-16 NOTE — Addendum Note (Signed)
 Addended by: Codee Bloodworth L on: 10/16/2024 09:32 AM   Modules accepted: Orders

## 2024-10-16 NOTE — Telephone Encounter (Signed)
 Patient returned my call - she will run out of medication prior to next OV on 11/02/23 with Izetta Rouleau, NP.   Renewed Rx for CellCept , reviewed instruction to continue current dose - CellCept  500mg  BID. Increase dose in the new year in early January as I discussed with Dr. Geronimo via secure chat. We will repeat labs 2 weeks after we increase dose. For now, continue current regimen.  Patient verbalizes understanding and agreement with plan.  Aleck Puls, PharmD, BCPS, CPP Clinical Pharmacist  [facility]  [facility]

## 2024-10-17 ENCOUNTER — Other Ambulatory Visit: Payer: Self-pay

## 2024-10-17 ENCOUNTER — Encounter (HOSPITAL_COMMUNITY)

## 2024-10-17 ENCOUNTER — Inpatient Hospital Stay (HOSPITAL_COMMUNITY): Admission: RE | Admit: 2024-10-17 | Source: Ambulatory Visit

## 2024-10-17 ENCOUNTER — Ambulatory Visit (HOSPITAL_COMMUNITY)
Admission: RE | Admit: 2024-10-17 | Discharge: 2024-10-17 | Disposition: A | Discharge status: Home / Self Care | Attending: Cardiovascular Disease | Admitting: Cardiovascular Disease

## 2024-10-17 ENCOUNTER — Encounter (HOSPITAL_COMMUNITY): Payer: Self-pay | Admitting: Cardiovascular Disease

## 2024-10-17 ENCOUNTER — Encounter (HOSPITAL_COMMUNITY): Admission: RE | Disposition: A | Payer: Self-pay | Source: Home / Self Care | Attending: Cardiovascular Disease

## 2024-10-17 DIAGNOSIS — I252 Old myocardial infarction: Secondary | ICD-10-CM | POA: Diagnosis not present

## 2024-10-17 DIAGNOSIS — I272 Pulmonary hypertension, unspecified: Secondary | ICD-10-CM | POA: Insufficient documentation

## 2024-10-17 DIAGNOSIS — Z7982 Long term (current) use of aspirin: Secondary | ICD-10-CM | POA: Diagnosis not present

## 2024-10-17 DIAGNOSIS — E785 Hyperlipidemia, unspecified: Secondary | ICD-10-CM | POA: Diagnosis not present

## 2024-10-17 DIAGNOSIS — Z955 Presence of coronary angioplasty implant and graft: Secondary | ICD-10-CM | POA: Diagnosis not present

## 2024-10-17 DIAGNOSIS — I350 Nonrheumatic aortic (valve) stenosis: Secondary | ICD-10-CM | POA: Diagnosis not present

## 2024-10-17 DIAGNOSIS — I130 Hypertensive heart and chronic kidney disease with heart failure and stage 1 through stage 4 chronic kidney disease, or unspecified chronic kidney disease: Secondary | ICD-10-CM | POA: Diagnosis not present

## 2024-10-17 DIAGNOSIS — Z79899 Other long term (current) drug therapy: Secondary | ICD-10-CM | POA: Diagnosis not present

## 2024-10-17 DIAGNOSIS — I5032 Chronic diastolic (congestive) heart failure: Secondary | ICD-10-CM | POA: Insufficient documentation

## 2024-10-17 HISTORY — PX: RIGHT HEART CATH: CATH118263

## 2024-10-17 LAB — POCT I-STAT EG7
Acid-Base Excess: 0 mmol/L (ref 0.0–2.0)
Bicarbonate: 25.5 mmol/L (ref 20.0–28.0)
Calcium, Ion: 1.25 mmol/L (ref 1.15–1.40)
HCT: 32 % — ABNORMAL LOW (ref 36.0–46.0)
Hemoglobin: 10.9 g/dL — ABNORMAL LOW (ref 12.0–15.0)
O2 Saturation: 64 %
Potassium: 3.3 mmol/L — ABNORMAL LOW (ref 3.5–5.1)
Sodium: 144 mmol/L (ref 135–145)
TCO2: 27 mmol/L (ref 22–32)
pCO2, Ven: 46.2 mmHg (ref 44–60)
pH, Ven: 7.349 (ref 7.25–7.43)
pO2, Ven: 35 mmHg (ref 32–45)

## 2024-10-17 SURGERY — RIGHT HEART CATH

## 2024-10-17 MED ORDER — SODIUM CHLORIDE 0.9% FLUSH: 3.0000 mL | Freq: Two times a day (BID) | INTRAVENOUS | Status: DC

## 2024-10-17 MED ORDER — HEPARIN (PORCINE) IN NACL 2000-0.9 UNIT/L-% IV SOLN
INTRAVENOUS | Status: DC | PRN
  Administered 2024-10-17: 16:00:00 1000 mL

## 2024-10-17 MED ORDER — SODIUM CHLORIDE 0.9% FLUSH: 3.0000 mL | INTRAVENOUS | Status: DC | PRN

## 2024-10-17 MED ORDER — SODIUM CHLORIDE 0.9 % IV SOLN: 250.0000 mL | INTRAVENOUS | Status: DC | PRN

## 2024-10-17 MED ORDER — LIDOCAINE HCL (PF) 1 % IJ SOLN
INTRAMUSCULAR | Status: AC
  Filled 2024-10-17: qty 30

## 2024-10-17 MED ORDER — ACETAMINOPHEN 325 MG PO TABS: 650.0000 mg | ORAL_TABLET | ORAL | Status: DC | PRN

## 2024-10-17 MED ORDER — HYDRALAZINE HCL 20 MG/ML IJ SOLN: 10.0000 mg | INTRAMUSCULAR | Status: DC | PRN

## 2024-10-17 MED ORDER — ONDANSETRON HCL 4 MG/2ML IJ SOLN: 4.0000 mg | Freq: Four times a day (QID) | INTRAMUSCULAR | Status: DC | PRN

## 2024-10-17 MED ORDER — LABETALOL HCL 5 MG/ML IV SOLN
10.0000 mg | INTRAVENOUS | Status: DC | PRN
  Administered 2024-10-17: 17:00:00 10 mg via INTRAVENOUS
  Filled 2024-10-17: qty 4

## 2024-10-17 MED ORDER — LIDOCAINE HCL (PF) 1 % IJ SOLN
INTRAMUSCULAR | Status: DC | PRN
  Administered 2024-10-17: 15:00:00 2 mL via INTRADERMAL

## 2024-10-17 SURGICAL SUPPLY — 6 items
CATH BALLN WEDGE 5F 110CM (CATHETERS) IMPLANT
PACK CARDIAC CATHETERIZATION (CUSTOM PROCEDURE TRAY) IMPLANT
SHEATH GLIDE SLENDER 4/5FR (SHEATH) IMPLANT
SHEATH PROBE COVER 6X72 (BAG) IMPLANT
TRANSDUCER W/STOPCOCK (MISCELLANEOUS) IMPLANT
TUBING ART PRESS 72 MALE/FEM (TUBING) IMPLANT

## 2024-10-17 NOTE — Discharge Instructions (Signed)

## 2024-10-17 NOTE — Progress Notes (Signed)
 Pt and husband received discharge instructions, teach back performed. Rt AC site is clean dry intact, site is soft, no signs of bleeding. IV's removed, no complications. Pt escorted out via wheelchair to husband's vehicle.

## 2024-10-17 NOTE — Interval H&P Note (Signed)
 History and Physical Interval Note:  10/17/2024 3:19 PM  Donna Williamson  has presented today for surgery, with the diagnosis of hp.  The various methods of treatment have been discussed with the patient and family. After consideration of risks, benefits and other options for treatment, the patient has consented to  Procedures: RIGHT HEART CATH (N/A) as a surgical intervention.  The patient's history has been reviewed, patient examined, no change in status, stable for surgery.  I have reviewed the patient's chart and labs.  Questions were answered to the patient's satisfaction.     Ozell Fell

## 2024-10-18 LAB — POCT I-STAT EG7
Acid-Base Excess: 0 mmol/L (ref 0.0–2.0)
Bicarbonate: 25.7 mmol/L (ref 20.0–28.0)
Calcium, Ion: 1.26 mmol/L (ref 1.15–1.40)
HCT: 33 % — ABNORMAL LOW (ref 36.0–46.0)
Hemoglobin: 11.2 g/dL — ABNORMAL LOW (ref 12.0–15.0)
O2 Saturation: 66 %
Potassium: 3.3 mmol/L — ABNORMAL LOW (ref 3.5–5.1)
Sodium: 144 mmol/L (ref 135–145)
TCO2: 27 mmol/L (ref 22–32)
pCO2, Ven: 46 mmHg (ref 44–60)
pH, Ven: 7.355 (ref 7.25–7.43)
pO2, Ven: 36 mmHg (ref 32–45)

## 2024-10-21 ENCOUNTER — Encounter (HOSPITAL_COMMUNITY)

## 2024-10-22 ENCOUNTER — Telehealth: Payer: Self-pay

## 2024-10-22 ENCOUNTER — Encounter (HOSPITAL_COMMUNITY)

## 2024-10-22 NOTE — Telephone Encounter (Signed)
 Copied from CRM #[id number]. Topic: Clinical - Medical Advice >> Oct 21, 2024  1:46 PM Benton O wrote: Reason for CRM: patient is calling to leave a message for NP Comer rouleau because patient had the test on the right side of her heart and wants to know has the care team came up with a plan for her  Please give patient a call concerning this information  [phone]  Pt is scheduled ov with Izetta 11/01/24 and care plan can be discussed then. Pt is aware nothing further needed.

## 2024-10-28 ENCOUNTER — Ambulatory Visit (HOSPITAL_COMMUNITY)
Admission: RE | Admit: 2024-10-28 | Discharge: 2024-10-28 | Disposition: A | Discharge status: Home / Self Care | Source: Ambulatory Visit | Attending: Internal Medicine | Admitting: Internal Medicine

## 2024-10-28 VITALS — BP 126/61 | HR 69 | Temp 97.1°F | Resp 18

## 2024-10-28 DIAGNOSIS — E785 Hyperlipidemia, unspecified: Secondary | ICD-10-CM | POA: Diagnosis present

## 2024-10-28 MED ORDER — INCLISIRAN SODIUM 284 MG/1.5ML ~~LOC~~ SOSY
PREFILLED_SYRINGE | SUBCUTANEOUS | Status: AC
  Filled 2024-10-28: qty 1.5

## 2024-10-28 MED ORDER — INCLISIRAN SODIUM 284 MG/1.5ML ~~LOC~~ SOSY
284.0000 mg | PREFILLED_SYRINGE | Freq: Once | SUBCUTANEOUS | Status: AC
  Administered 2024-10-28: 284 mg via SUBCUTANEOUS

## 2024-10-29 ENCOUNTER — Encounter (HOSPITAL_COMMUNITY)

## 2024-10-30 ENCOUNTER — Telehealth (HOSPITAL_COMMUNITY): Payer: Self-pay

## 2024-10-30 NOTE — Telephone Encounter (Signed)
 Auth Submission: APPROVED Site of care: Site of care: [facility] Payer: [employer] Medicare Medication & CPT/J Code(s) submitted: Leqvio  (Inclisiran) J1306 Diagnosis Code: E78.5 Route of submission (phone, fax, portal): portal Phone # Fax # Auth type: Buy/Bill HB Units/visits requested: 284mg  q77months Reference number: [id number] Approval from: 12/15/22 to 10/30/25

## 2024-11-01 ENCOUNTER — Telehealth: Payer: Self-pay | Admitting: Nurse Practitioner

## 2024-11-01 ENCOUNTER — Encounter: Payer: Self-pay | Admitting: Nurse Practitioner

## 2024-11-01 ENCOUNTER — Ambulatory Visit: Admitting: Nurse Practitioner

## 2024-11-01 VITALS — BP 136/77 | HR 68 | Temp 97.2°F | Ht 65.0 in | Wt 195.2 lb

## 2024-11-01 DIAGNOSIS — R4586 Emotional lability: Secondary | ICD-10-CM

## 2024-11-01 DIAGNOSIS — K649 Unspecified hemorrhoids: Secondary | ICD-10-CM

## 2024-11-01 DIAGNOSIS — F32A Depression, unspecified: Secondary | ICD-10-CM | POA: Diagnosis not present

## 2024-11-01 DIAGNOSIS — J300 Vasomotor rhinitis: Secondary | ICD-10-CM | POA: Diagnosis not present

## 2024-11-01 DIAGNOSIS — I110 Hypertensive heart disease with heart failure: Secondary | ICD-10-CM | POA: Diagnosis not present

## 2024-11-01 DIAGNOSIS — I351 Nonrheumatic aortic (valve) insufficiency: Secondary | ICD-10-CM | POA: Diagnosis not present

## 2024-11-01 DIAGNOSIS — J849 Interstitial pulmonary disease, unspecified: Secondary | ICD-10-CM

## 2024-11-01 DIAGNOSIS — J9611 Chronic respiratory failure with hypoxia: Secondary | ICD-10-CM

## 2024-11-01 DIAGNOSIS — I272 Pulmonary hypertension, unspecified: Secondary | ICD-10-CM

## 2024-11-01 DIAGNOSIS — J9610 Chronic respiratory failure, unspecified whether with hypoxia or hypercapnia: Secondary | ICD-10-CM

## 2024-11-01 DIAGNOSIS — I509 Heart failure, unspecified: Secondary | ICD-10-CM | POA: Diagnosis not present

## 2024-11-01 DIAGNOSIS — I350 Nonrheumatic aortic (valve) stenosis: Secondary | ICD-10-CM

## 2024-11-01 MED ORDER — IPRATROPIUM BROMIDE 0.06 % NA SOLN: 2.0000 | Freq: Three times a day (TID) | NASAL | 12 refills | Status: AC

## 2024-11-01 NOTE — Telephone Encounter (Signed)
 Pt underwent RHC with Dr. Wonda 12/18. Moderate PH; no grouping diagnosis. 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 Would you recommend starting Tyvaso? Thanks

## 2024-11-01 NOTE — Progress Notes (Signed)
 "  @Patient  ID: Donna Williamson, female    DOB: 07-Jul-1946, 79 y.o.   MRN: [id number]  Chief Complaint  Patient presents with   Interstitial Lung Disease    F/U Pt states she is doing okay since LOV, wants to talk about cardiac cath    Referring provider: Shayne Anes, MD  HPI: 79 year old female, never smoker followed for CT-ILD and chronic respiratory failure on supplemental O2. She is a patient of Dr. Reeves and last seen in office 07/15/2024 by [facility] NP. Past medical history significant for NSTEMI, HTN, mild AS, CAD, HF, GERD, CKD, Sjogren's syndrome, HLD, depression.   TEST/EVENTS:  03/15/2024 PFT: FVC 87, FEV1 91, ratio 78, DLCO 63 04/03/2024 HRCT chest: atherosclerosis/calcifications. Scattered nodes, likely reactive and not pathologically enlarged. Esophagus decompressed. Coarse interstitial infiltrates in the lungs. Ground glass previously seen improved. Emphysematous changes. Subpleural fibrosis.  06/17/2024 echo: EF 55-60%. RV function normal. Trivial MR. Mild AS.  10/17/2024 RHC: PA 54/17, mean 31 mmHg, PCWP mean 11 mmHg, PVR 2.96 Wu  05/22/2024: OV with Dr. Geronimo. 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 Since a flareup in spring 2025; not off prednisone . BP is fine. Prior hypotension related to adrenal insufficiency and medications. Dr. Jeannetta felt based on CT chest results, CellCept  would be indicated. PFT stable. Reviewed immunosuppression therapy. Plan to start. Move up echo given concern for PH. If doing well and stable, can consider antifibrotic.   07/15/2024: OV with Chaske Paskett NP Donna Williamson is an 78 year old female with interstitial lung disease who presents with side effects from Cellcept  and sinus symptoms. She has been experiencing side effects from Cellcept  (mycophenolate ) used for her connective tissue disease. She noted swelling in her feet. She also experienced heart palpitations described as 'flutters' and significant fatigue, which improved after discontinuing Cellcept . She  was also having headaches. No recurrent symptoms since coming off the CellCept .  For the past one and a half to two weeks, she has had sinus symptoms, including phlegm in her throat, which she attributes to her sinuses. She reports tenderness under her eyes and has been using Flonase  and saline spray for relief. She has not had a fever but feels warm at times despite having occasional chills. She coughs up phlegm which is sometimes clear and sometimes colored. No hemoptysis, headaches, ear pain, sore throat. No recent sick exposures. No viral testing completed.  Her past medical history includes pneumonia in April, for which she was hospitalized. She is currently on oxygen  therapy and has been using Singulair . She was previously on Symbicort and albuterol  but has not used them recently. She also takes Protonix  for reflux. She is concerned about her connective tissue disease affecting her lungs and is interested in pulmonary rehabilitation.  No increased O2 requirement. Using 3 lpm with exertion. Able to maintain saturations on room air at rest.    11/01/2024: Today - follow up Discussed the use of AI scribe software for clinical note transcription with the patient, who gave verbal consent to proceed.  History of Present Illness  Donna Williamson is an 79 year old female with pulmonary hypertension and interstitial lung disease who presents for follow-up regarding her pulmonary condition.   She underwent a RHC on 12/18 with Dr. Wonda with moderately elevated pulmonary artery pressures. She has not been started on any medications for this. She is on oxygen  therapy with activity. No recent low levels. She did get a stair lift at home. She reports that she went to orientation  for pulmonary rehab but was unable to continue after her blood pressure was found to be high during orientation. Her blood pressure readings have fluctuated recently. She has not addressed this with her heart doctor. No  swelling in her legs, orthopnea, PND, CP.  Breathing is stable from her last visit. No wheezing.   She experiences mood swings, describing episodes of snapping at her partner followed by feeling fine shortly after. She attributes these mood swings to stress and depression, particularly following the loss of her mother in June. She is currently on Prozac , which was increased to 40 mg in June, but she feels it may not be effective. No SI/HI. Has not discussed again with Dr. Shayne.   She has experienced weight loss since being on the CellCept , from 224 pounds in June to 192 pounds at home and 195 pounds at the clinic. She is pleased with the weight loss as it has allowed her to wear clothes she hasn't worn in years and has improved her breathing and knee pain. Her appetite is fine. No GI symptoms including changes in bowel habits or abdominal pain.   She has a history of sinus issues, reporting drainage and occasional expectoration of clear to milky sputum. She uses saline solution and Flonase  nasal spray to manage these symptoms. No sinus tenderness, headaches, ear pressure/pain.   She did have some scant blood after having a bowel movement last week. She admits to straining with this episode. No recurrence since or any other symptoms. She discussed this with Dr. Shayne who felt it was likely related to hemorrhoids.     Allergies  Allergen Reactions   Levofloxacin Anaphylaxis   Plavix  [Clopidogrel  Bisulfate] Rash   Azathioprine  Diarrhea and Other (See Comments)    Feeling weak as a kitten   Ciprofibrate     Other reaction(s): swelling, muscle aches   Codeine Nausea Only   Drug Ingredient [Ticagrelor ] Other (See Comments)    Patient reports hemorrhage after medical trial of brilinta  in 2017   Nitrofurantoin  Other (See Comments)    VISION CHANGES   Ciprofloxacin Rash    Other Reaction(s): nausea and vomiting   Penicillins Rash   Sulfa Antibiotics Rash    Immunization History   Administered Date(s) Administered   Influenza-Unspecified 07/01/2022, 07/02/2023   Pneumococcal-Unspecified 07/01/2014, 04/30/2022   Respiratory Syncytial Virus Vaccine,Recomb Aduvanted(Arexvy) 08/31/2022   Td 09/10/2019   Tdap 08/23/2015   Unspecified SARS-COV-2 Vaccination 11/25/2019, 12/16/2019, 08/01/2020, 07/16/2021, 04/07/2022, 07/31/2022   Zoster Recombinant(Shingrix) 01/31/2018, 05/07/2018    Past Medical History:  Diagnosis Date   Anemia    Anginal pain    Anxiety    Arthritis    feet, knees, back (05/02/2016)   Asthma    CAD (coronary artery disease)    a. 04/2016: NSTEMI 95% stenosis 1st Mrg (s/p DES)   Chest pain    a. 10/2013 Exercise Myoview : Ef 65%, no ischemia.   CHF (congestive heart failure) (HCC)    Chronic bronchitis (HCC)    Chronic lower back pain    COPD (chronic obstructive pulmonary disease) (HCC)    GERD (gastroesophageal reflux disease)    History of blood transfusion 1960s   related to my hysterectomy   Hypercholesterolemia    Hypertension    Lower extremity edema    Mild aortic stenosis 03/07/2023   TTE 02/28/2023: EF 55-60, no RWMA, mild asymmetric LVH, NL RVSF, mild MR, mild aortic stenosis (bulky subvalvular calcification), mean AV gradient 9.6, V-max 199 cm/s, RAP 3  Mitral valve prolapse    NSTEMI (non-ST elevated myocardial infarction) (HCC) 05/01/2016   Pneumonia 05/01/2016   saw trace of slight pneumonia/CT scan (05/02/2016)   Seasonal allergies    Sjogren's syndrome    Wheezing     Tobacco History: Social History   Tobacco Use  Smoking Status Never   Passive exposure: Past  Smokeless Tobacco Never   Counseling given: Not Answered   Outpatient Medications Prior to Visit  Medication Sig Dispense Refill   acetaminophen  (TYLENOL ) 500 MG tablet Take 1,000 mg by mouth every 6 (six) hours as needed for moderate pain (pain score 4-6).     albuterol  (PROVENTIL ) (2.5 MG/3ML) 0.083% nebulizer solution Take 3 mLs (2.5 mg total) by  nebulization every 2 (two) hours as needed for wheezing or shortness of breath. 75 mL 2   ALPRAZolam  (XANAX ) 0.5 MG tablet Take 0.5 mg by mouth daily as needed for anxiety.      aspirin  EC 81 MG tablet Take 81 mg by mouth daily.     carboxymethylcellulose (REFRESH PLUS) 0.5 % SOLN Place 1 drop into both eyes daily as needed (dry eyes).     Cholecalciferol  (VITAMIN D3) 2000 UNITS TABS Take 2,000 Units by mouth daily.     cyanocobalamin  1000 MCG tablet Take 1,000 mcg by mouth daily.     EPIPEN  2-PAK 0.3 MG/0.3ML SOAJ injection Inject 0.3 mg into the skin daily as needed (allergic reaction).      ezetimibe  (ZETIA ) 10 MG tablet Take 10 mg by mouth at bedtime.     FLUoxetine  (PROZAC ) 40 MG capsule Take 40 mg by mouth daily.     fluticasone  (FLONASE ) 50 MCG/ACT nasal spray Place 2 sprays into both nostrils in the morning.     furosemide  (LASIX ) 20 MG tablet Take 1 tablet (20 mg total) by mouth daily. 90 tablet 3   inclisiran (LEQVIO ) 284 MG/1.5ML SOSY injection Inject 284 mg into the skin every 6 (six) months.     isosorbide  mononitrate (IMDUR ) 30 MG 24 hr tablet TAKE 1 TABLET(30 MG) BY MOUTH DAILY 90 tablet 0   metoprolol  tartrate (LOPRESSOR ) 25 MG tablet TAKE 1/2 TABLET(12.5 MG) BY MOUTH TWICE DAILY 90 tablet 3   montelukast  (SINGULAIR ) 10 MG tablet Take 10 mg by mouth at bedtime.     Multiple Vitamin (MULTIVITAMIN WITH MINERALS) TABS tablet Take 1 tablet by mouth daily.     mycophenolate  (CELLCEPT ) 500 MG tablet Take 1 tablet (500 mg total) by mouth 2 (two) times daily. 60 tablet 1   nitroGLYCERIN  (NITROSTAT ) 0.4 MG SL tablet Place 1 tablet (0.4 mg total) under the tongue every 5 (five) minutes as needed for chest pain (up to 3 doses. If taking 3rd dose call 911). 25 tablet 3   OXYGEN  Inhale 4 L into the lungs daily as needed (When moving around).     pantoprazole  (PROTONIX ) 40 MG tablet TAKE 2 TABLETS(80 MG) BY MOUTH DAILY 180 tablet 3   potassium chloride  (KLOR-CON ) 10 MEQ tablet Take 10 mEq by  mouth See admin instructions. Take 1 tablet (10 mEq) by mouth on Monday's and Thursday's.     PROAIR  HFA 108 (90 BASE) MCG/ACT inhaler Inhale 1-2 puffs into the lungs every 4 (four) hours as needed for wheezing.     rosuvastatin  (CRESTOR ) 20 MG tablet Take 1 tablet (20 mg total) by mouth daily at 6 PM. 30 tablet 0   trolamine salicylate (ASPERCREME) 10 % cream Apply 1 Application topically daily as needed for muscle pain.  No facility-administered medications prior to visit.     Review of Systems: as above    Physical Exam:  BP 136/77 Comment: Rechecked due to elevated BP at initial reading  Pulse 68   Temp (!) 97.2 F (36.2 C)   Ht 5' 5 (1.651 m) Comment: Per pt  Wt 195 lb 3.2 oz (88.5 kg)   SpO2 96% Comment: 2L pulse o2  BMI 32.48 kg/m   GEN: Pleasant, interactive, well-kempt; chronically-ill appearing; obese; in no acute distress HEENT:  Normocephalic and atraumatic. PERRLA. Sclera white. Nasal turbinates pink, moist and patent bilaterally. No rhinorrhea present. Oropharynx pink and moist, without exudate or edema. No lesions, ulcerations, or postnasal drip.  NECK:  Supple w/ fair ROM. No JVD present. No lymphadenopathy.   CV: RRR, no m/r/g, no peripheral edema. Pulses intact, +2 bilaterally. No cyanosis, pallor or clubbing. PULMONARY:  Unlabored, regular breathing. Mild bibasilar crackles otherwise clear bilaterally A&P w/o wheezes/rales/rhonchi. No accessory muscle use.  GI: BS present and normoactive. Soft, non-tender to palpation.  MSK: No erythema, warmth or tenderness. Cap refil <2 sec all extrem.  Neuro: A/Ox3. No focal deficits noted.   Skin: Warm, no lesions or rashe Psych: Normal affect and behavior. Judgement and thought content appropriate.     Lab Results:  CBC    Component Value Date/Time   WBC 6.7 10/07/2024 1113   RBC 3.51 (L) 10/07/2024 1113   HGB 10.9 (L) 10/17/2024 1536   HGB 11.2 (L) 10/17/2024 1536   HGB 11.0 (L) 09/20/2024 1204   HCT 32.0  (L) 10/17/2024 1536   HCT 33.0 (L) 10/17/2024 1536   HCT 35.6 09/20/2024 1204   PLT 269.0 10/07/2024 1113   PLT 296 09/20/2024 1204   MCV 96.9 10/07/2024 1113   MCV 100 (H) 09/20/2024 1204   MCH 30.9 09/20/2024 1204   MCH 32.4 04/03/2024 1114   MCHC 33.0 10/07/2024 1113   RDW 14.9 10/07/2024 1113   RDW 13.4 09/20/2024 1204   LYMPHSABS 2.8 07/15/2024 1121   MONOABS 0.8 07/15/2024 1121   EOSABS 0.6 07/15/2024 1121   BASOSABS 0.1 07/15/2024 1121    BMET    Component Value Date/Time   NA 144 10/17/2024 1536   NA 144 10/17/2024 1536   NA 140 09/20/2024 1204   K 3.3 (L) 10/17/2024 1536   K 3.3 (L) 10/17/2024 1536   CL 106 10/07/2024 1113   CO2 25 10/07/2024 1113   GLUCOSE 106 (H) 10/07/2024 1113   BUN 19 10/07/2024 1113   BUN 16 09/20/2024 1204   CREATININE 1.04 10/07/2024 1113   CREATININE 1.25 (H) 09/05/2016 1037   CALCIUM  9.1 10/07/2024 1113   GFRNONAA 23 (L) 04/03/2024 1114   GFRNONAA 49 (L) 04/02/2016 1154   GFRAA 45 (L) 06/20/2019 1518   GFRAA 56 (L) 04/02/2016 1154    BNP    Component Value Date/Time   BNP 54.4 04/03/2024 1114   BNP 35.9 04/02/2016 1154     Imaging:  CARDIAC CATHETERIZATION Result Date: 10/17/2024 RA mean 1 mmHg RV 55/2 mmHg PA 54/17 mean 31 mmHg Pulmonary capillary wedge pressure A-wave 16, V wave 15, mean 11 mmHg Transpulmonary gradient 20 mmHg Cardiac output 6.75 L/min Cardiac index 3.5 PVR 2.96 Wood units Conclusion: moderate pulmonary HTN    Administration History     None          Latest Ref Rng & Units 09/12/2024    2:43 PM 03/15/2024   10:17 AM 09/27/2023   12:50 PM  PFT  Results  FVC-Pre L 2.21  2.45  2.46   FVC-Predicted Pre % 79  87  86   FVC-Post L   2.50   FVC-Predicted Post %   88   Pre FEV1/FVC % % 75  78  78   Post FEV1/FCV % %   79   FEV1-Pre L 1.66  1.90  1.92   FEV1-Predicted Pre % 79  91  90   FEV1-Post L   1.98   DLCO uncorrected ml/min/mmHg 9.10  10.97  11.70   DLCO UNC% % 46  55  59   DLCO corrected  ml/min/mmHg  12.45  11.70   DLCO COR %Predicted %  63  59   DLVA Predicted % 64  79  74   TLC L   4.82   TLC % Predicted %   91   RV % Predicted %   98     No results found for: NITRICOXIDE      Assessment & Plan:   Assessment & Plan Pulmonary hypertension  Right heart catheterization indicates elevated pulmonary artery pressures. PCWP mean 11 mmHg, consistent with Group 3; however, PCR is very borderline at <3 so will discuss with Dr. Geronimo. Tyvaso is considered for treatment if interstitial lung disease is the primary cause. Reviewed side effect profile. Message sent to Dr. Geronimo and advised pt would notify her of decision. Advised to monitor for any s/s of volume overload. Monitor sodium/fluid intake.  - Sent message to Dr. Geronimo to review catheterization results and determine if Tyvaso is appropriate. - If approved, will initiate Tyvaso treatment, considering potential side effects such as cough and headache. - Advised on fluid management and sodium intake to prevent fluid retention. - Will coordinate with pharmacy team for insurance approval and specialty pharmacy process.  CT ILD ILD related to connective tissue disease. Decline in FVC and DLCO at last PFT. Plan to repeat in February. She is managed on immunosuppression therapy with CellCept . Awaiting increase in dosage. Will send message to pharmacy team. She will need PJP prophylaxis at doses >1g bid.  Followed by rheumatology.  - Continue current management and monitor for progression. - Will discuss with Dr. Jeannetta regarding Sjogren's syndrome  - Start pulmonary rehab once cleared by cardiology   Hypertension Monitor BP at home for goal <140/90. Notify cardiology if consistent readings above this. - Follow up with cardiology as scheduled  Hemorrhoids  Likely cause of scant blood in stool x 1 episode. No frank blood or recurrence. Advised to continue stool softeners as prescribed. ED precautions reviewed. Aware  to follow up with PCP if symptoms recur/persist for stool testing - Follow up with PCP   Depression/mood swings No SI/HI. May need SSRI adjusted.  - Follow up with PCP to discuss medication changes   Chronic respiratory failure Stable without increased O2 requirement. Goal >88-90% - Continue supplemental oxygen    Vasomotor rhinitis Nasal drainage, likely exacerbated by oxygen  therapy. Current use of saline solution and Flonase  for symptom management. - Prescribed ipratropium nasal spray to manage symptoms. - Continue use of saline solution and Flonase .  Aortic valve insufficiency, mild Mild aortic valve insufficiency with calcifications, as noted on a recent echocardiogram. The condition requires annual monitoring to assess for progression. - Repeat echocardiogram with cardiology in one year to monitor aortic valve stenosis    Advised if symptoms do not improve or worsen, to please contact office for sooner follow up or seek emergency care.   I spent 45 minutes  of dedicated to the care of this patient on the date of this encounter to include pre-visit review of records, face-to-face time with the patient discussing conditions above, post visit ordering of testing, clinical documentation with the electronic health record, making appropriate referrals as documented, and communicating necessary findings to members of the patients care team.  Comer LULLA Rouleau, NP 11/01/2024  Pt aware and understands NP's role.   "

## 2024-11-01 NOTE — Patient Instructions (Addendum)
 Continue Albuterol  inhaler 2 puffs every 6 hours as needed for shortness of breath or wheezing. Notify if symptoms persist despite rescue inhaler/neb use.  Continue flonase  2 sprays each nostril daily  Continue montelukast  1 tab daily  Continue pantoprazole  daily  Continue supplemental oxygen  2-3 lpm and at night with activity for goal >88-90% Continue CellCept  - will reach out to the pharmacy team about increasing your dosing   Ipratropium nasal spray Three times a day as needed for nasal congestion/drainage   I am going to reach out to Dr. Geronimo and Dr. Wonda about your right heart catheterization and decide on Tyvaso. We will let you know next week regarding this   Call Dr. Margurite office to get a follow up appointment and have them clear you to return to pulmonary rehab since they were worried about your blood pressure   Continue to monitor the weight loss but for now, ok with where you are at    Follow up with Dr. Geronimo as scheduled. If symptoms do not improve or worsen, please contact office for sooner follow up or seek emergency care

## 2024-11-01 NOTE — Telephone Encounter (Signed)
 Seen in office today. Ok to increase CellCept  dosing based on my discussion with pt. I see there was a note to increase after the first of the year. Thanks.

## 2024-11-04 MED ORDER — MYCOPHENOLATE MOFETIL 500 MG PO TABS: 1500.0000 mg | ORAL_TABLET | Freq: Two times a day (BID) | ORAL | 2 refills | Status: AC

## 2024-11-04 NOTE — Telephone Encounter (Signed)
 Rx for CellCept  1500mg  BID triaged to [facility]. Lab orders entered.

## 2024-11-04 NOTE — Telephone Encounter (Signed)
 The original increase study data- > this was I/E for tyvaso on Ph-ILD. Based on fact PVR is almost 3, I recommend tyvaso. Sending message to [facility] as well  xxxxx  Include in patients with ILD   - Right heart cath :  PVR > 3, PCWP </= 15, Pmap >/=  25 -  Patient needed to be able to walk 131m / 300 feet on a 93m walk test  Exclude - LVEF < 40%,  - Baseline o2 > 10L -

## 2024-11-04 NOTE — Addendum Note (Signed)
 Addended by: Myleka Moncure L on: 11/04/2024 08:54 AM   Modules accepted: Orders

## 2024-11-05 ENCOUNTER — Encounter (HOSPITAL_COMMUNITY)

## 2024-11-05 NOTE — Telephone Encounter (Signed)
 Please let pt know that Dr. Geronimo agreed with starting Tyvaso (inhaled medication for [facility] that we had discussed). We did review side effect profile at her last OV. Pharmacy team will reach out to her regarding next steps/review medication again. Thanks.

## 2024-11-06 ENCOUNTER — Other Ambulatory Visit: Payer: Self-pay

## 2024-11-06 ENCOUNTER — Other Ambulatory Visit: Payer: Self-pay | Admitting: Cardiovascular Disease

## 2024-11-06 ENCOUNTER — Telehealth: Payer: Self-pay

## 2024-11-06 NOTE — Telephone Encounter (Signed)
 Copied from CRM #[id number]. Topic: Clinical - Prescription Issue >> Nov 04, 2024  2:14 PM Russell PARAS wrote: Reason for CRM:   Pt is contacting clinic regarding the prescribed CellCept . She was advised by her pharmacy they would only be able to fill the prescription partially until 1/6. Pt would like to know if she should use the remaining tablets she has from previous prescription until the pharmacy can complete the fill.   CB#  [phone]

## 2024-11-06 NOTE — Telephone Encounter (Signed)
 Pt is aware of below message/recommendations and voiced her understanding.  Nothing further needed.

## 2024-11-06 NOTE — Patient Instructions (Signed)
 Visit Information  Thank you for taking time to visit with me today. Please don't hesitate to contact me if I can be of assistance to you before our next scheduled appointment.  Your next care management appointment is by telephone on 12/05/24 at 3:00 pm  Please call the care guide team at [phone] if you need to cancel, schedule, or reschedule an appointment.   Please call the Suicide and Crisis Lifeline: 988 call the [place]  National Suicide Prevention Lifeline: (636) 098-4078 or TTY: 610-781-2494 TTY 564-275-6049) to talk to a trained counselor if you are experiencing a Mental Health or Behavioral Health Crisis or need someone to talk to.  Heddy Shutter, RN, MSN, BSN, CCM [place]  [facility], Population Health Case Manager Phone: [phone]   Pulmonary Hypertension Pulmonary hypertension is a condition that causes high blood pressure in the blood vessels of your lungs. This makes your heart work extra hard to pump blood to your lungs. And when your heart has to work harder, it can be harder for you to breathe. Over time, this can hurt and weaken your heart. What are the causes? This condition can be put into one of five groups based on what causes it. Group 1 happens when the blood vessels that carry blood to your lungs get too thick or stiff. This may happen with no known cause or it may be: Inherited. This means it's passed from parent to child. Caused by another disease, such as a disease of the heart or liver. Caused by some medicines or poisons. Group 2 happens if you have problems with the valves in your heart or if the left side of your heart, also called your left ventricle, gets weak. Group 3 can be caused by long-term diseases of the lungs, such as chronic obstructive pulmonary disease or COPD. This can also happen if you don't get enough oxygen , such as if you have trouble breathing when you sleep or if you live at a high altitude. Group 4 is caused by  blood clots in your lungs. Group 5 includes other causes, such as sickle cell anemia or growths of cells that aren't normal called tumors. What are the signs or symptoms? Symptoms may include: Shortness of breath. A cough. Tiredness. Feeling dizzy or light-headed. You may also faint. A fast heart beat. You may also feel your heart flutter or skip a beat. Your lips or fingers turning blue. Chest pain or tightness. How is this diagnosed? This condition may be diagnosed with: Blood tests. Imaging tests. These may include: Chest X-rays. CT scans. An echocardiogram. This is an ultrasound of your heart. A ventilation-perfusion scan. This sees how well air and blood flow in and out of your lungs. A pulmonary function test. This looks at how much air your lungs can hold. A 6-minute walk test. This may be done to check your breathing during exercise. Cardiac catheterization. This is a procedure that uses a soft tube called a catheter to check the arteries of your heart. A lung biopsy. This is when a small piece of tissue is removed from your lung for testing. How is this treated? There's no cure for this condition. But treatment can help you feel better and can slow the condition down. You may need: Oxygen  therapy. Cardiac rehabilitation, or rehab. This is a program that teaches you how to: Care for your heart. Exercise. Go back to your normal activities. Medicines to: Lower your blood pressure. Relax the blood vessels in your lungs. Help your heart  pump more blood. Help your body get rid of extra fluid. Thin your blood. This can stop you from getting blood clots. Lung surgery. This can relieve pressure on your heart. You may need this if other treatments don't work. Heart-lung or lung transplant. This may be done in very severe cases. Follow these instructions at home: Eating and drinking  Eat a healthy diet. Eat lots of fresh fruits and vegetables, whole grains, and beans. Limit  how much salt you take in to less than 2,300 mg a day. Salt is also called sodium. Activity Get lots of rest. Exercise as told. Ask your health care provider what types of exercise are safe for you. Lifestyle Do not use any products that contain nicotine or tobacco. These products include cigarettes, chewing tobacco, and vaping devices, such as e-cigarettes. If you need help quitting, ask your provider. Stay away when other people smoke. Do not sit in hot tubs or saunas for long stretches of time. Do not get pregnant. If needed, talk with your provider about birth control. Avoid high altitudes. It can be stressful to live with pulmonary hypertension. Talk with your provider about finding support groups and online help. General instructions Take over-the-counter and prescription medicines only as told by your provider. Do not change or stop medicines without talking with your provider. Stay up to date on your shots, such as your yearly flu shot and pneumonia shot. Use oxygen  therapy at home as told. Keep all follow-up visits. Your provider will check your breathing and make changes to your medicines as needed. Contact a health care provider if: Your cough gets worse. You have more shortness of breath. You start to have trouble doing things you could do before. You need to use more medicines or oxygen , or you need to use them more often than normal. Get help right away if: You have severe shortness of breath. You have chest pain or pressure. You cough up blood. These symptoms may be an emergency. Get help right away. Call 911. Do not wait to see if the symptoms will go away. Do not drive yourself to the hospital. This information is not intended to replace advice given to you by your health care provider. Make sure you discuss any questions you have with your health care provider. Document Revised: 01/02/2023 Document Reviewed: 01/02/2023 Elsevier Patient Education  2024 [employer].

## 2024-11-06 NOTE — Patient Outreach (Signed)
 Complex Care Management   Visit Note  11/06/2024  Name:  Donna Williamson MRN: [id number] DOB: [birth year]  Situation: Referral received for Complex Care Management related to Pulmonary Disease I obtained verbal consent from Patient.  Visit completed with Patient  on the phone  Background:   Past Medical History:  Diagnosis Date   Anemia    Anginal pain    Anxiety    Arthritis    feet, knees, back (05/02/2016)   Asthma    CAD (coronary artery disease)    a. 04/2016: NSTEMI 95% stenosis 1st Mrg (s/p DES)   Chest pain    a. 10/2013 Exercise Myoview : Ef 65%, no ischemia.   CHF (congestive heart failure) (HCC)    Chronic bronchitis (HCC)    Chronic lower back pain    COPD (chronic obstructive pulmonary disease) (HCC)    GERD (gastroesophageal reflux disease)    History of blood transfusion 1960s   related to my hysterectomy   Hypercholesterolemia    Hypertension    Lower extremity edema    Mild aortic stenosis 03/07/2023   TTE 02/28/2023: EF 55-60, no RWMA, mild asymmetric LVH, NL RVSF, mild MR, mild aortic stenosis (bulky subvalvular calcification), mean AV gradient 9.6, V-max 199 cm/s, RAP 3   Mitral valve prolapse    NSTEMI (non-ST elevated myocardial infarction) (HCC) 05/01/2016   Pneumonia 05/01/2016   saw trace of slight pneumonia/CT scan (05/02/2016)   Seasonal allergies    Sjogren's syndrome    Wheezing     Assessment: Patient Reported Symptoms:  Cognitive Cognitive Status: No symptoms reported      Neurological Neurological Review of Symptoms: No symptoms reported    HEENT HEENT Symptoms Reported: No symptoms reported      Cardiovascular Cardiovascular Symptoms Reported: No symptoms reported Other Cardiovascular Symptoms: Patient denies any questions or concerns or signs/siymptoms of fluid volume overload. Does patient have uncontrolled Hypertension?: No (patient reprots she has been monitoring. PCP visit today completed. Patient does not remember the exact  reading.) Cardiovascular Management Strategies: Medication therapy, Routine screening, Weight management Do You Have a Working Readable Scale?: Yes Weight: 191 lb (86.6 kg) (patient reports last weight today.) Cardiovascular Self-Management Outcome: 4 (good)  Respiratory Respiratory Symptoms Reported: No symptoms reported Other Respiratory Symptoms: Continues to wear/use oxgyen as recommended. 2Liters/[place] as needed; 2L/[place] with sleep and 3l with activity. recent diagnosis of Pulmonary HTN. Respiratory Management Strategies: Routine screening, Oxygen  therapy, Weight management, Asthma action plan, Adequate rest, Fluid modification Respiratory Self-Management Outcome: 3 (uncertain)  Endocrine Endocrine Symptoms Reported: No symptoms reported Is patient diabetic?: No    Gastrointestinal Gastrointestinal Symptoms Reported: No symptoms reported      Genitourinary Genitourinary Symptoms Reported: No symptoms reported    Integumentary Integumentary Symptoms Reported: No symptoms reported    Musculoskeletal Musculoskelatal Symptoms Reviewed: Joint pain Additional Musculoskeletal Details: reports bilateral knee pain due to arthritis, states is not going to have surgery uses Aspercreme which helps relieve pain        Psychosocial Additional Psychological Details: patient continues to work with therapist regarding depression/anxiety, patient states a lot has to due with loss of mother this past year. She states she is looking forward to 2026.         Today's Vitals   11/06/24 1505  Weight: 191 lb (86.6 kg)   Pain Scale: 0-10 Pain Score: 4  Pain Location: Knee (patient reports bilateral) Pain Descriptors / Indicators: Aching Pain Onset: On-going Patients Stated Pain Goal: 3  Medications Reviewed Today  Reviewed by Huriel Matt M, RN (Registered Nurse) on 11/06/24 at 1503  Med List Status: <None>   Medication Order Taking? Sig Documenting Provider Last Dose Status Informant   acetaminophen  (TYLENOL ) 500 MG tablet [id number] Yes Take 1,000 mg by mouth every 6 (six) hours as needed for moderate pain (pain score 4-6). [provider]  Active Self  albuterol  (PROVENTIL ) (2.5 MG/3ML) 0.083% nebulizer solution [id number] Yes Take 3 mLs (2.5 mg total) by nebulization every 2 (two) hours as needed for wheezing or shortness of breath. Akula, Vijaya, MD  Active Self           Med Note MACK, APRIL H   Fri Jan 10, 2020  3:15 PM)    ALPRAZolam  (XANAX ) 0.5 MG tablet [id number] Yes Take 0.5 mg by mouth daily as needed for anxiety.  [provider]  Active Self           Med Note ODETTA, MARIAN A   Mon Aug 14, 2017  9:55 AM)    aspirin  EC 81 MG tablet [id number] Yes Take 81 mg by mouth daily. [provider]  Active Self  carboxymethylcellulose (REFRESH PLUS) 0.5 % SOLN [id number] Yes Place 1 drop into both eyes daily as needed (dry eyes). [provider]  Active Self  Cholecalciferol  (VITAMIN D3) 2000 UNITS TABS [id number] Yes Take 2,000 Units by mouth daily. [provider]  Active Self  cyanocobalamin  1000 MCG tablet [id number] Yes Take 1,000 mcg by mouth daily. [provider]  Active Self  EPIPEN  2-PAK 0.3 MG/0.3ML SOAJ injection [id number] Yes Inject 0.3 mg into the skin daily as needed (allergic reaction).  [provider]  Active Self           Med Note CRAIG, SUZEN GAILS   Fri Nov 29, 2013  1:48 PM)     ezetimibe  (ZETIA ) 10 MG tablet [id number] Yes Take 10 mg by mouth at bedtime. [provider]  Active Self  FLUoxetine  (PROZAC ) 40 MG capsule [id number] Yes Take 40 mg by mouth daily. [provider]  Active Self  fluticasone  (FLONASE ) 50 MCG/ACT nasal spray [id number] Yes Place 2 sprays into both nostrils in the morning. [provider]  Active Self  furosemide  (LASIX ) 20 MG tablet [id number] Yes Take 1 tablet (20 mg total) by mouth daily. Wonda Sharper, MD  Active Self  inclisiran (LEQVIO ) 284  MG/1.5ML SOSY injection [id number] Yes Inject 284 mg into the skin every 6 (six) months. [provider]  Active Self           Med Note JACKOLYN, [place] R   Fri Sep 20, 2024 10:40 AM)    ipratropium (ATROVENT ) 0.06 % nasal spray [id number] Yes Place 2 sprays into both nostrils 3 (three) times daily. Malachy Comer GAILS, NP  Active   isosorbide  mononitrate (IMDUR ) 30 MG 24 hr tablet [id number] Yes TAKE 1 TABLET(30 MG) BY MOUTH DAILY Wonda Sharper, MD  Active Self  metoprolol  tartrate (LOPRESSOR ) 25 MG tablet [id number] Yes TAKE 1/2 TABLET(12.5 MG) BY MOUTH TWICE DAILY Wonda Sharper, MD  Active Self  montelukast  (SINGULAIR ) 10 MG tablet [id number] Yes Take 10 mg by mouth at bedtime. [provider]  Active Self  Multiple Vitamin (MULTIVITAMIN WITH MINERALS) TABS tablet [id number] Yes Take 1 tablet by mouth daily. [provider]  Active Self  mycophenolate  (CELLCEPT ) 500 MG tablet [id number] Yes Take 3 tablets (1,500 mg total) by mouth 2 (two) times daily. Geronimo Amel, MD  Active   nitroGLYCERIN  (NITROSTAT ) 0.4 MG  SL tablet [id number] Yes Place 1 tablet (0.4 mg total) under the tongue every 5 (five) minutes as needed for chest pain (up to 3 doses. If taking 3rd dose call 911). Lelon Glendia DASEN, PA-C  Active Self           Med Note STEPHEN, MICHELE   Tue Jan 31, 2023  3:17 PM)    OXYGEN  [id number] Yes Inhale 4 L into the lungs daily as needed (When moving around). [provider]  Active Self  pantoprazole  (PROTONIX ) 40 MG tablet [id number] Yes TAKE 2 TABLETS(80 MG) BY MOUTH DAILY Wonda Sharper, MD  Active Self  potassium chloride  (KLOR-CON ) 10 MEQ tablet [id number] Yes Take 10 mEq by mouth See admin instructions. Take 1 tablet (10 mEq) by mouth on Monday's and Thursday's. [provider]  Active Self  PROAIR  HFA 108 (90 BASE) MCG/ACT inhaler [id number] Yes Inhale 1-2 puffs into the lungs every 4 (four) hours as needed for wheezing. [provider]   Active Self           Med Note CRAIG, SUZEN GAILS   Fri Nov 29, 2013  1:48 PM)    rosuvastatin  (CRESTOR ) 20 MG tablet [id number] Yes Take 1 tablet (20 mg total) by mouth daily at 6 PM. Bhagat, Bhavinkumar, PA  Active Self  trolamine salicylate (ASPERCREME) 10 % cream [id number] Yes Apply 1 Application topically daily as needed for muscle pain. [provider]  Active Self            Recommendation:   Continue Current Plan of Care  Follow Up Plan:   Telephone follow up appointment date/time:  12/05/24 at 3:00 pm  Heddy Shutter, RN, MSN, BSN, CCM [place]  [facility], Population Health Case Manager Phone: [phone]

## 2024-11-06 NOTE — Telephone Encounter (Signed)
 This is fine as long as the medication is for mycophenolate  500mg    Sherry Pennant, PharmD, MPH, BCPS, CPP Clinical Pharmacist

## 2024-11-06 NOTE — Telephone Encounter (Signed)
 I called and spoke with patient, she was able to get her prescription filled on 11/05/24.  She went ahead and used the mycophenolate  500 mg until she got her prescription filled.  She is taking 3 tablets twice daily.  Nothing further needed.

## 2024-11-07 ENCOUNTER — Telehealth: Payer: Self-pay

## 2024-11-07 ENCOUNTER — Encounter: Payer: Self-pay | Admitting: Cardiovascular Disease

## 2024-11-07 ENCOUNTER — Encounter (HOSPITAL_COMMUNITY)

## 2024-11-07 NOTE — Telephone Encounter (Signed)
 Received notification via telephone encounter that pt is Tyvaso new start. Submitted a Prior Authorization request to [employer] for TYVASO DPI via CoverMyMeds. Will update once we receive a response.  Key: [id number]

## 2024-11-07 NOTE — Telephone Encounter (Signed)
 6 month follow-up with me. Thx!

## 2024-11-07 NOTE — Telephone Encounter (Signed)
 Will initiate biv for Tyvaso DPI in new encounter

## 2024-11-12 ENCOUNTER — Encounter (HOSPITAL_COMMUNITY)

## 2024-11-12 NOTE — Telephone Encounter (Signed)
 Received a fax regarding Prior Authorization from [employer] for TYVASO DPI. Authorization has been DENIED because your diagnosis must be confirmed by a HRCT or lung biopsy with evidence of ILD.  Phone# [phone]  [facility] and verbally submitted urgent appeal for Tyvaso DPI. Faxed supporting chart notes. Will update when we receive a response.  Fax #: [phone] Case #: [id number]

## 2024-11-14 ENCOUNTER — Encounter (HOSPITAL_COMMUNITY)

## 2024-11-15 ENCOUNTER — Other Ambulatory Visit (HOSPITAL_COMMUNITY): Payer: Self-pay

## 2024-11-15 NOTE — Telephone Encounter (Signed)
 Received notification from [employer] regarding a prior authorization for TYVASO DPI. Authorization has been APPROVED from 10/31/24 to 10/30/25. Approval letter sent to scan center.  Per test claim, copay for 28 days supply is $100  Authorization # [id number] Phone # [phone]  Will coordinate with pt to sign paperwork

## 2024-11-18 ENCOUNTER — Other Ambulatory Visit

## 2024-11-18 DIAGNOSIS — J849 Interstitial pulmonary disease, unspecified: Secondary | ICD-10-CM

## 2024-11-18 DIAGNOSIS — Z79899 Other long term (current) drug therapy: Secondary | ICD-10-CM

## 2024-11-19 ENCOUNTER — Encounter (HOSPITAL_COMMUNITY)

## 2024-11-19 ENCOUNTER — Ambulatory Visit: Payer: Self-pay

## 2024-11-19 LAB — COMPREHENSIVE METABOLIC PANEL WITH GFR
ALT: 13 IU/L (ref 0–32)
AST: 30 IU/L (ref 0–40)
Albumin: 4.1 g/dL (ref 3.8–4.8)
Alkaline Phosphatase: 95 IU/L (ref 49–135)
BUN/Creatinine Ratio: 17 (ref 12–28)
BUN: 19 mg/dL (ref 8–27)
Bilirubin Total: 0.5 mg/dL (ref 0.0–1.2)
CO2: 21 mmol/L (ref 20–29)
Calcium: 9.4 mg/dL (ref 8.7–10.3)
Chloride: 103 mmol/L (ref 96–106)
Creatinine, Ser: 1.12 mg/dL — ABNORMAL HIGH (ref 0.57–1.00)
Globulin, Total: 2.6 g/dL (ref 1.5–4.5)
Glucose: 93 mg/dL (ref 70–99)
Potassium: 4.3 mmol/L (ref 3.5–5.2)
Sodium: 140 mmol/L (ref 134–144)
Total Protein: 6.7 g/dL (ref 6.0–8.5)
eGFR: 50 mL/min/1.73 — ABNORMAL LOW

## 2024-11-19 LAB — CBC WITH DIFFERENTIAL/PLATELET
Basophils Absolute: 0 x10E3/uL (ref 0.0–0.2)
Basos: 0 %
EOS (ABSOLUTE): 0.4 x10E3/uL (ref 0.0–0.4)
Eos: 4 %
Hematocrit: 38.3 % (ref 34.0–46.6)
Hemoglobin: 11.9 g/dL (ref 11.1–15.9)
Immature Grans (Abs): 0 x10E3/uL (ref 0.0–0.1)
Immature Granulocytes: 0 %
Lymphocytes Absolute: 3.6 x10E3/uL — ABNORMAL HIGH (ref 0.7–3.1)
Lymphs: 40 %
MCH: 30.8 pg (ref 26.6–33.0)
MCHC: 31.1 g/dL — ABNORMAL LOW (ref 31.5–35.7)
MCV: 99 fL — ABNORMAL HIGH (ref 79–97)
Monocytes Absolute: 0.6 x10E3/uL (ref 0.1–0.9)
Monocytes: 7 %
Neutrophils Absolute: 4.4 x10E3/uL (ref 1.4–7.0)
Neutrophils: 49 %
Platelets: 362 x10E3/uL (ref 150–450)
RBC: 3.86 x10E6/uL (ref 3.77–5.28)
RDW: 12.5 % (ref 11.7–15.4)
WBC: 9 x10E3/uL (ref 3.4–10.8)

## 2024-11-21 ENCOUNTER — Encounter (HOSPITAL_COMMUNITY)

## 2024-11-21 NOTE — Telephone Encounter (Signed)
 Called pt and she will come by the office tomorrow to sign. Explained to her that because it's a specialty med it will have to be mailed to her directly. Pt verbalized her understanding and has my direct phone number if she has any additional questions.

## 2024-11-22 NOTE — Telephone Encounter (Signed)
 Received fax from [facility] stating rx was triaged to [facility].  Pharmacy phone #: [phone]

## 2024-11-22 NOTE — Telephone Encounter (Signed)
 Obtained signature from pt, completed Tyvaso form and faxed with insurance card, med list and pa approval letter to [facility]. Will update when we receive a response.  Phone #: [phone] Fax #: [phone]   Pt enrolled in Pulmonary Hypertension grant through [employer]:  Amount: $6500 Award Period: 10/23/24 - 10/22/25 BIN: [id number] PCN: [id number] Group: [id number] ID: [id number]  Helpdesk: [phone]  This info was also sent with Tyvaso paperwork.

## 2024-11-26 ENCOUNTER — Encounter (HOSPITAL_COMMUNITY)

## 2024-11-28 ENCOUNTER — Encounter (HOSPITAL_COMMUNITY)

## 2024-12-03 ENCOUNTER — Encounter (HOSPITAL_COMMUNITY)

## 2024-12-05 ENCOUNTER — Encounter (HOSPITAL_COMMUNITY)

## 2024-12-05 ENCOUNTER — Telehealth

## 2024-12-11 ENCOUNTER — Telehealth

## 2024-12-12 ENCOUNTER — Encounter

## 2024-12-12 ENCOUNTER — Ambulatory Visit: Admitting: Internal Medicine

## 2024-12-17 ENCOUNTER — Ambulatory Visit: Admitting: Internal Medicine

## 2025-04-02 ENCOUNTER — Ambulatory Visit: Admitting: Cardiovascular Disease

## 2025-04-16 ENCOUNTER — Encounter (HOSPITAL_COMMUNITY)

## 2025-04-28 ENCOUNTER — Encounter (HOSPITAL_COMMUNITY)

## 2025-04-29 ENCOUNTER — Encounter (HOSPITAL_COMMUNITY)
